# Patient Record
Sex: Female | Born: 1940
Health system: Southern US, Community
[De-identification: ages and names within clinical notes are randomized; demographics above are authoritative.]

## PROBLEM LIST (undated history)

## (undated) ENCOUNTER — Emergency Department (HOSPITAL_COMMUNITY): Admission: EM | Payer: Medicare Other | Source: Home / Self Care

## (undated) DIAGNOSIS — I272 Pulmonary hypertension, unspecified: Secondary | ICD-10-CM

## (undated) DIAGNOSIS — M810 Age-related osteoporosis without current pathological fracture: Secondary | ICD-10-CM

## (undated) DIAGNOSIS — I4891 Unspecified atrial fibrillation: Secondary | ICD-10-CM

## (undated) DIAGNOSIS — I10 Essential (primary) hypertension: Secondary | ICD-10-CM

## (undated) DIAGNOSIS — S3210XA Unspecified fracture of sacrum, initial encounter for closed fracture: Secondary | ICD-10-CM

## (undated) DIAGNOSIS — Z952 Presence of prosthetic heart valve: Secondary | ICD-10-CM

## (undated) DIAGNOSIS — I7121 Aneurysm of the ascending aorta, without rupture: Secondary | ICD-10-CM

## (undated) DIAGNOSIS — R0902 Hypoxemia: Secondary | ICD-10-CM

## (undated) DIAGNOSIS — R918 Other nonspecific abnormal finding of lung field: Secondary | ICD-10-CM

## (undated) DIAGNOSIS — T7840XA Allergy, unspecified, initial encounter: Secondary | ICD-10-CM

## (undated) DIAGNOSIS — I712 Thoracic aortic aneurysm, without rupture: Secondary | ICD-10-CM

## (undated) DIAGNOSIS — J449 Chronic obstructive pulmonary disease, unspecified: Secondary | ICD-10-CM

## (undated) DIAGNOSIS — E785 Hyperlipidemia, unspecified: Secondary | ICD-10-CM

## (undated) DIAGNOSIS — I70229 Atherosclerosis of native arteries of extremities with rest pain, unspecified extremity: Secondary | ICD-10-CM

## (undated) DIAGNOSIS — I350 Nonrheumatic aortic (valve) stenosis: Secondary | ICD-10-CM

## (undated) DIAGNOSIS — J439 Emphysema, unspecified: Secondary | ICD-10-CM

## (undated) DIAGNOSIS — I998 Other disorder of circulatory system: Secondary | ICD-10-CM

## (undated) DIAGNOSIS — R011 Cardiac murmur, unspecified: Secondary | ICD-10-CM

## (undated) DIAGNOSIS — I472 Ventricular tachycardia: Secondary | ICD-10-CM

## (undated) DIAGNOSIS — K219 Gastro-esophageal reflux disease without esophagitis: Secondary | ICD-10-CM

## (undated) HISTORY — PX: TOTAL HIP ARTHROPLASTY: SHX124

## (undated) HISTORY — DX: Gastro-esophageal reflux disease without esophagitis: K21.9

## (undated) HISTORY — DX: Hyperlipidemia, unspecified: E78.5

## (undated) HISTORY — DX: Emphysema, unspecified: J43.9

## (undated) HISTORY — DX: Essential (primary) hypertension: I10

## (undated) HISTORY — DX: Allergy, unspecified, initial encounter: T78.40XA

## (undated) HISTORY — DX: Unspecified fracture of sacrum, initial encounter for closed fracture: S32.10XA

## (undated) HISTORY — PX: LAMINECTOMY: SHX219

## (undated) HISTORY — DX: Pulmonary hypertension, unspecified: I27.20

## (undated) HISTORY — PX: EYE SURGERY: SHX253

## (undated) HISTORY — PX: CARDIAC VALVE REPLACEMENT: SHX585

## (undated) HISTORY — PX: CATARACT EXTRACTION: SUR2

## (undated) HISTORY — DX: Age-related osteoporosis without current pathological fracture: M81.0

## (undated) HISTORY — DX: Nonrheumatic aortic (valve) stenosis: I35.0

## (undated) HISTORY — DX: Cardiac murmur, unspecified: R01.1

## (undated) HISTORY — DX: Ventricular tachycardia: I47.2

## (undated) HISTORY — DX: Hypoxemia: R09.02

---
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
## 1973-10-29 HISTORY — PX: TUBAL LIGATION: SHX77

## 2011-10-30 LAB — HM COLONOSCOPY: HM COLON: NORMAL

## 2011-12-27 DIAGNOSIS — I10 Essential (primary) hypertension: Secondary | ICD-10-CM | POA: Diagnosis not present

## 2011-12-27 DIAGNOSIS — J449 Chronic obstructive pulmonary disease, unspecified: Secondary | ICD-10-CM | POA: Diagnosis not present

## 2011-12-27 DIAGNOSIS — E7800 Pure hypercholesterolemia, unspecified: Secondary | ICD-10-CM | POA: Diagnosis not present

## 2012-01-07 DIAGNOSIS — I10 Essential (primary) hypertension: Secondary | ICD-10-CM | POA: Diagnosis not present

## 2012-02-12 DIAGNOSIS — H5200 Hypermetropia, unspecified eye: Secondary | ICD-10-CM | POA: Diagnosis not present

## 2012-02-12 DIAGNOSIS — H524 Presbyopia: Secondary | ICD-10-CM | POA: Diagnosis not present

## 2012-02-12 DIAGNOSIS — H538 Other visual disturbances: Secondary | ICD-10-CM | POA: Diagnosis not present

## 2012-02-12 DIAGNOSIS — Z961 Presence of intraocular lens: Secondary | ICD-10-CM | POA: Diagnosis not present

## 2012-02-14 DIAGNOSIS — M2040 Other hammer toe(s) (acquired), unspecified foot: Secondary | ICD-10-CM | POA: Diagnosis not present

## 2012-02-14 DIAGNOSIS — M7750 Other enthesopathy of unspecified foot: Secondary | ICD-10-CM | POA: Diagnosis not present

## 2012-02-19 DIAGNOSIS — Z124 Encounter for screening for malignant neoplasm of cervix: Secondary | ICD-10-CM | POA: Diagnosis not present

## 2012-02-21 DIAGNOSIS — Z1231 Encounter for screening mammogram for malignant neoplasm of breast: Secondary | ICD-10-CM | POA: Diagnosis not present

## 2012-02-21 DIAGNOSIS — R928 Other abnormal and inconclusive findings on diagnostic imaging of breast: Secondary | ICD-10-CM | POA: Diagnosis not present

## 2012-02-25 DIAGNOSIS — L723 Sebaceous cyst: Secondary | ICD-10-CM | POA: Diagnosis not present

## 2012-02-25 DIAGNOSIS — E7800 Pure hypercholesterolemia, unspecified: Secondary | ICD-10-CM | POA: Diagnosis not present

## 2012-02-25 DIAGNOSIS — I10 Essential (primary) hypertension: Secondary | ICD-10-CM | POA: Diagnosis not present

## 2012-02-25 DIAGNOSIS — J449 Chronic obstructive pulmonary disease, unspecified: Secondary | ICD-10-CM | POA: Diagnosis not present

## 2012-02-25 DIAGNOSIS — N9089 Other specified noninflammatory disorders of vulva and perineum: Secondary | ICD-10-CM | POA: Diagnosis not present

## 2012-03-04 DIAGNOSIS — Z4800 Encounter for change or removal of nonsurgical wound dressing: Secondary | ICD-10-CM | POA: Diagnosis not present

## 2012-03-19 LAB — HM DEXA SCAN

## 2012-05-20 DIAGNOSIS — N760 Acute vaginitis: Secondary | ICD-10-CM | POA: Diagnosis not present

## 2012-08-05 DIAGNOSIS — E7800 Pure hypercholesterolemia, unspecified: Secondary | ICD-10-CM | POA: Diagnosis not present

## 2012-08-05 DIAGNOSIS — J449 Chronic obstructive pulmonary disease, unspecified: Secondary | ICD-10-CM | POA: Diagnosis not present

## 2012-08-05 DIAGNOSIS — J209 Acute bronchitis, unspecified: Secondary | ICD-10-CM | POA: Diagnosis not present

## 2012-08-23 DIAGNOSIS — Z23 Encounter for immunization: Secondary | ICD-10-CM | POA: Diagnosis not present

## 2012-10-16 DIAGNOSIS — M79609 Pain in unspecified limb: Secondary | ICD-10-CM | POA: Diagnosis not present

## 2012-10-16 DIAGNOSIS — I70209 Unspecified atherosclerosis of native arteries of extremities, unspecified extremity: Secondary | ICD-10-CM | POA: Diagnosis not present

## 2012-10-16 DIAGNOSIS — B351 Tinea unguium: Secondary | ICD-10-CM | POA: Diagnosis not present

## 2012-10-24 DIAGNOSIS — M5137 Other intervertebral disc degeneration, lumbosacral region: Secondary | ICD-10-CM | POA: Diagnosis not present

## 2012-10-24 DIAGNOSIS — IMO0002 Reserved for concepts with insufficient information to code with codable children: Secondary | ICD-10-CM | POA: Diagnosis not present

## 2012-10-27 DIAGNOSIS — M5430 Sciatica, unspecified side: Secondary | ICD-10-CM | POA: Diagnosis not present

## 2012-10-28 DIAGNOSIS — IMO0002 Reserved for concepts with insufficient information to code with codable children: Secondary | ICD-10-CM | POA: Diagnosis not present

## 2012-10-28 DIAGNOSIS — M5430 Sciatica, unspecified side: Secondary | ICD-10-CM | POA: Diagnosis not present

## 2012-11-03 DIAGNOSIS — M5430 Sciatica, unspecified side: Secondary | ICD-10-CM | POA: Diagnosis not present

## 2012-11-04 DIAGNOSIS — M5137 Other intervertebral disc degeneration, lumbosacral region: Secondary | ICD-10-CM | POA: Diagnosis not present

## 2012-11-04 DIAGNOSIS — Z981 Arthrodesis status: Secondary | ICD-10-CM | POA: Diagnosis not present

## 2012-11-05 DIAGNOSIS — H5200 Hypermetropia, unspecified eye: Secondary | ICD-10-CM | POA: Diagnosis not present

## 2012-11-05 DIAGNOSIS — M5430 Sciatica, unspecified side: Secondary | ICD-10-CM | POA: Diagnosis not present

## 2012-11-05 DIAGNOSIS — H049 Disorder of lacrimal system, unspecified: Secondary | ICD-10-CM | POA: Diagnosis not present

## 2012-11-05 DIAGNOSIS — H524 Presbyopia: Secondary | ICD-10-CM | POA: Diagnosis not present

## 2012-11-05 DIAGNOSIS — H538 Other visual disturbances: Secondary | ICD-10-CM | POA: Diagnosis not present

## 2012-11-10 DIAGNOSIS — M5430 Sciatica, unspecified side: Secondary | ICD-10-CM | POA: Diagnosis not present

## 2012-11-14 DIAGNOSIS — M5430 Sciatica, unspecified side: Secondary | ICD-10-CM | POA: Diagnosis not present

## 2012-11-17 DIAGNOSIS — M5430 Sciatica, unspecified side: Secondary | ICD-10-CM | POA: Diagnosis not present

## 2012-11-19 DIAGNOSIS — M5430 Sciatica, unspecified side: Secondary | ICD-10-CM | POA: Diagnosis not present

## 2012-11-21 DIAGNOSIS — M5430 Sciatica, unspecified side: Secondary | ICD-10-CM | POA: Diagnosis not present

## 2012-11-26 DIAGNOSIS — M5430 Sciatica, unspecified side: Secondary | ICD-10-CM | POA: Diagnosis not present

## 2012-11-27 DIAGNOSIS — R3129 Other microscopic hematuria: Secondary | ICD-10-CM | POA: Diagnosis not present

## 2012-11-27 DIAGNOSIS — N281 Cyst of kidney, acquired: Secondary | ICD-10-CM | POA: Diagnosis not present

## 2012-11-28 DIAGNOSIS — M76899 Other specified enthesopathies of unspecified lower limb, excluding foot: Secondary | ICD-10-CM | POA: Diagnosis not present

## 2012-11-28 DIAGNOSIS — Z96649 Presence of unspecified artificial hip joint: Secondary | ICD-10-CM | POA: Diagnosis not present

## 2012-11-28 DIAGNOSIS — M5430 Sciatica, unspecified side: Secondary | ICD-10-CM | POA: Diagnosis not present

## 2012-12-10 DIAGNOSIS — IMO0002 Reserved for concepts with insufficient information to code with codable children: Secondary | ICD-10-CM | POA: Diagnosis not present

## 2012-12-10 DIAGNOSIS — M48061 Spinal stenosis, lumbar region without neurogenic claudication: Secondary | ICD-10-CM | POA: Diagnosis not present

## 2012-12-10 DIAGNOSIS — M76899 Other specified enthesopathies of unspecified lower limb, excluding foot: Secondary | ICD-10-CM | POA: Diagnosis not present

## 2013-01-01 DIAGNOSIS — N281 Cyst of kidney, acquired: Secondary | ICD-10-CM | POA: Diagnosis not present

## 2013-01-27 LAB — HM MAMMOGRAPHY: HM Mammogram: NORMAL

## 2013-01-27 LAB — HM PAP SMEAR: HM PAP: NORMAL

## 2013-02-25 DIAGNOSIS — Z9189 Other specified personal risk factors, not elsewhere classified: Secondary | ICD-10-CM | POA: Diagnosis not present

## 2013-02-25 DIAGNOSIS — Z124 Encounter for screening for malignant neoplasm of cervix: Secondary | ICD-10-CM | POA: Diagnosis not present

## 2013-03-11 DIAGNOSIS — J449 Chronic obstructive pulmonary disease, unspecified: Secondary | ICD-10-CM | POA: Diagnosis not present

## 2013-03-11 DIAGNOSIS — H9190 Unspecified hearing loss, unspecified ear: Secondary | ICD-10-CM | POA: Diagnosis not present

## 2013-03-11 DIAGNOSIS — J4489 Other specified chronic obstructive pulmonary disease: Secondary | ICD-10-CM | POA: Diagnosis not present

## 2013-03-11 DIAGNOSIS — E7800 Pure hypercholesterolemia, unspecified: Secondary | ICD-10-CM | POA: Diagnosis not present

## 2013-03-11 DIAGNOSIS — I10 Essential (primary) hypertension: Secondary | ICD-10-CM | POA: Diagnosis not present

## 2013-03-13 DIAGNOSIS — Z803 Family history of malignant neoplasm of breast: Secondary | ICD-10-CM | POA: Diagnosis not present

## 2013-03-13 DIAGNOSIS — Z1231 Encounter for screening mammogram for malignant neoplasm of breast: Secondary | ICD-10-CM | POA: Diagnosis not present

## 2013-03-26 DIAGNOSIS — L723 Sebaceous cyst: Secondary | ICD-10-CM | POA: Diagnosis not present

## 2013-03-26 DIAGNOSIS — D1801 Hemangioma of skin and subcutaneous tissue: Secondary | ICD-10-CM | POA: Diagnosis not present

## 2013-03-26 DIAGNOSIS — L821 Other seborrheic keratosis: Secondary | ICD-10-CM | POA: Diagnosis not present

## 2013-08-13 DIAGNOSIS — Z23 Encounter for immunization: Secondary | ICD-10-CM | POA: Diagnosis not present

## 2014-03-19 ENCOUNTER — Ambulatory Visit (INDEPENDENT_AMBULATORY_CARE_PROVIDER_SITE_OTHER): Payer: Medicare Other | Admitting: Family Medicine

## 2014-03-19 ENCOUNTER — Encounter: Payer: Self-pay | Admitting: Family Medicine

## 2014-03-19 VITALS — BP 132/80 | HR 92 | Temp 97.9°F | Resp 16 | Ht 65.0 in | Wt 158.4 lb

## 2014-03-19 DIAGNOSIS — I10 Essential (primary) hypertension: Secondary | ICD-10-CM | POA: Insufficient documentation

## 2014-03-19 DIAGNOSIS — J449 Chronic obstructive pulmonary disease, unspecified: Secondary | ICD-10-CM | POA: Insufficient documentation

## 2014-03-19 DIAGNOSIS — J439 Emphysema, unspecified: Secondary | ICD-10-CM

## 2014-03-19 DIAGNOSIS — E785 Hyperlipidemia, unspecified: Secondary | ICD-10-CM

## 2014-03-19 DIAGNOSIS — J438 Other emphysema: Secondary | ICD-10-CM | POA: Diagnosis not present

## 2014-03-19 DIAGNOSIS — J441 Chronic obstructive pulmonary disease with (acute) exacerbation: Secondary | ICD-10-CM | POA: Insufficient documentation

## 2014-03-19 DIAGNOSIS — Z803 Family history of malignant neoplasm of breast: Secondary | ICD-10-CM | POA: Diagnosis not present

## 2014-03-19 NOTE — Progress Notes (Signed)
   Subjective:    Patient ID: Dominique [name], female    DOB: [birth year], 73 y.o.   MRN: [id number]  HPI    Review of Systems     Objective:   Physical Exam  Cardiovascular: Normal rate and regular rhythm.   Murmur (II/VI SEM heard best over RUSB) heard.         Assessment & Plan:

## 2014-03-19 NOTE — Progress Notes (Signed)
   Subjective:    Patient ID: Dominique [name], female    DOB: 1941/01/16, 73 y.o.   MRN: [id number]  HPI New to establish.  Previous MD- Ranelle Oyster [place], [place])  HTN- chronic problem, on benazepril and amlodipine.  Well controlled.  No CP, SOB, HAs, visual changes, edema.  Hyperlipidemia- chronic problem, on Pravastatin.  Denies abd pain, N/V, myalgias  Emphysema- pt quit smoking 10 yrs ago.  Reports she has not had any lung issues or difficulty breathing since she quit.  Health maintenance- due for mammo (mother had breast cancer), interested in GYN referral   Review of Systems For ROS see HPI     Objective:   Physical Exam  Vitals reviewed. Constitutional: She is oriented to person, place, and time. She appears well-developed and well-nourished. No distress.  HENT:  Head: Normocephalic and atraumatic.  Eyes: Conjunctivae and EOM are normal. Pupils are equal, round, and reactive to light.  Neck: Normal range of motion. Neck supple. No thyromegaly present.  Cardiovascular: Normal rate, regular rhythm, normal heart sounds and intact distal pulses.   No murmur heard. Pulmonary/Chest: Effort normal and breath sounds normal. No respiratory distress.  Abdominal: Soft. She exhibits no distension. There is no tenderness.  Musculoskeletal: She exhibits no edema.  Lymphadenopathy:    She has no cervical adenopathy.  Neurological: She is alert and oriented to person, place, and time.  Skin: Skin is warm and dry.  Psychiatric: She has a normal mood and affect. Her behavior is normal.          Assessment & Plan:

## 2014-03-19 NOTE — Patient Instructions (Signed)
Schedule a fasting lab appt Schedule your complete physical in 6 months We'll notify you of your lab results and make any changes if needed We'll call you with your GYN appt Call with any questions or concerns Welcome!  We're glad to have you!

## 2014-03-19 NOTE — Assessment & Plan Note (Signed)
New to provider, ongoing for pt.  Check labs.  Adjust meds prn  

## 2014-03-19 NOTE — Assessment & Plan Note (Signed)
New to provider, ongoing for pt.  Asymptomatic.  Well controlled.  Check labs.  No anticipated med changes.

## 2014-03-19 NOTE — Assessment & Plan Note (Signed)
Refer to GYN at pt's request for mammo.

## 2014-03-19 NOTE — Assessment & Plan Note (Signed)
New for provider, pt has hx of this.  Reports she has been asymptomatic since she quit smoking in 2005.  Will follow.

## 2014-03-24 ENCOUNTER — Other Ambulatory Visit (INDEPENDENT_AMBULATORY_CARE_PROVIDER_SITE_OTHER): Payer: Medicare Other

## 2014-03-24 DIAGNOSIS — I10 Essential (primary) hypertension: Secondary | ICD-10-CM

## 2014-03-24 DIAGNOSIS — E785 Hyperlipidemia, unspecified: Secondary | ICD-10-CM

## 2014-03-24 LAB — BASIC METABOLIC PANEL
BUN: 14 mg/dL (ref 6–23)
CO2: 27 mEq/L (ref 19–32)
Calcium: 9 mg/dL (ref 8.4–10.5)
Chloride: 106 mEq/L (ref 96–112)
Creatinine, Ser: 0.8 mg/dL (ref 0.4–1.2)
GFR: 75.63 mL/min (ref >60.00)
Glucose, Bld: 79 mg/dL (ref 70–99)
POTASSIUM: 4.2 meq/L (ref 3.5–5.1)
SODIUM: 140 meq/L (ref 135–145)

## 2014-03-24 LAB — CBC WITH DIFFERENTIAL/PLATELET
BASOS ABS: 0 10*3/uL (ref 0.0–0.1)
Basophils Relative: 0.5 % (ref 0.0–3.0)
EOS ABS: 0.6 10*3/uL (ref 0.0–0.7)
Eosinophils Relative: 7.1 % — ABNORMAL HIGH (ref 0.0–5.0)
HEMATOCRIT: 40.4 % (ref 36.0–46.0)
HEMOGLOBIN: 13.3 g/dL (ref 12.0–15.0)
LYMPHS ABS: 2.1 10*3/uL (ref 0.7–4.0)
LYMPHS PCT: 27.1 % (ref 12.0–46.0)
MCHC: 33 g/dL (ref 30.0–36.0)
MCV: 93.1 fl (ref 78.0–100.0)
Monocytes Absolute: 0.8 10*3/uL (ref 0.1–1.0)
Monocytes Relative: 9.8 % (ref 3.0–12.0)
Neutro Abs: 4.3 10*3/uL (ref 1.4–7.7)
Neutrophils Relative %: 55.5 % (ref 43.0–77.0)
Platelets: 319 10*3/uL (ref 150.0–400.0)
RBC: 4.34 Mil/uL (ref 3.87–5.11)
RDW: 14.4 % (ref 11.5–15.5)
WBC: 7.7 10*3/uL (ref 4.0–10.5)

## 2014-03-24 LAB — HEPATIC FUNCTION PANEL
ALT: 17 U/L (ref 0–35)
AST: 22 U/L (ref 0–37)
Albumin: 3.6 g/dL (ref 3.5–5.2)
Alkaline Phosphatase: 54 U/L (ref 39–117)
Bilirubin, Direct: 0 mg/dL (ref 0.0–0.3)
TOTAL PROTEIN: 6.7 g/dL (ref 6.0–8.3)
Total Bilirubin: 0.5 mg/dL (ref 0.2–1.2)

## 2014-03-24 LAB — LIPID PANEL
Cholesterol: 187 mg/dL (ref 0–200)
HDL: 73.4 mg/dL (ref >39.00)
LDL Cholesterol: 98 mg/dL (ref 0–99)
Total CHOL/HDL Ratio: 3
Triglycerides: 78 mg/dL (ref 0.0–149.0)
VLDL: 15.6 mg/dL (ref 0.0–40.0)

## 2014-03-24 LAB — TSH: TSH: 0.85 u[IU]/mL (ref 0.35–4.50)

## 2014-03-25 ENCOUNTER — Encounter: Payer: Self-pay | Admitting: General Practice

## 2014-03-29 DIAGNOSIS — Z124 Encounter for screening for malignant neoplasm of cervix: Secondary | ICD-10-CM | POA: Diagnosis not present

## 2014-03-29 DIAGNOSIS — Z1231 Encounter for screening mammogram for malignant neoplasm of breast: Secondary | ICD-10-CM | POA: Diagnosis not present

## 2014-05-10 ENCOUNTER — Other Ambulatory Visit: Payer: Self-pay | Admitting: General Practice

## 2014-05-10 MED ORDER — PRAVASTATIN SODIUM 20 MG PO TABS: 20.0000 mg | ORAL_TABLET | Freq: Every day | ORAL | Status: DC

## 2014-05-10 MED ORDER — BENAZEPRIL HCL 20 MG PO TABS: 20.0000 mg | ORAL_TABLET | Freq: Every day | ORAL | Status: DC

## 2014-05-10 MED ORDER — AMLODIPINE BESYLATE 5 MG PO TABS: 5.0000 mg | ORAL_TABLET | Freq: Every day | ORAL | Status: DC

## 2014-08-10 DIAGNOSIS — J029 Acute pharyngitis, unspecified: Secondary | ICD-10-CM | POA: Diagnosis not present

## 2014-08-10 DIAGNOSIS — R0982 Postnasal drip: Secondary | ICD-10-CM | POA: Diagnosis not present

## 2014-08-11 ENCOUNTER — Ambulatory Visit: Payer: Medicare Other | Admitting: Family Medicine

## 2014-08-26 DIAGNOSIS — J301 Allergic rhinitis due to pollen: Secondary | ICD-10-CM | POA: Diagnosis not present

## 2014-08-26 DIAGNOSIS — J3081 Allergic rhinitis due to animal (cat) (dog) hair and dander: Secondary | ICD-10-CM | POA: Diagnosis not present

## 2014-08-26 DIAGNOSIS — J302 Other seasonal allergic rhinitis: Secondary | ICD-10-CM | POA: Diagnosis not present

## 2014-08-26 DIAGNOSIS — J309 Allergic rhinitis, unspecified: Secondary | ICD-10-CM | POA: Diagnosis not present

## 2014-09-27 ENCOUNTER — Encounter: Payer: Self-pay | Admitting: Family Medicine

## 2014-09-27 ENCOUNTER — Ambulatory Visit (INDEPENDENT_AMBULATORY_CARE_PROVIDER_SITE_OTHER): Payer: Medicare Other | Admitting: Family Medicine

## 2014-09-27 ENCOUNTER — Encounter: Payer: Self-pay | Admitting: General Practice

## 2014-09-27 VITALS — BP 144/86 | HR 81 | Temp 97.9°F | Resp 16 | Ht 66.0 in | Wt 157.2 lb

## 2014-09-27 DIAGNOSIS — Z23 Encounter for immunization: Secondary | ICD-10-CM

## 2014-09-27 DIAGNOSIS — E785 Hyperlipidemia, unspecified: Secondary | ICD-10-CM | POA: Diagnosis not present

## 2014-09-27 DIAGNOSIS — Z0000 Encounter for general adult medical examination without abnormal findings: Secondary | ICD-10-CM | POA: Diagnosis not present

## 2014-09-27 DIAGNOSIS — I10 Essential (primary) hypertension: Secondary | ICD-10-CM

## 2014-09-27 LAB — LIPID PANEL
CHOL/HDL RATIO: 3
Cholesterol: 219 mg/dL — ABNORMAL HIGH (ref 0–200)
HDL: 75.9 mg/dL (ref >39.00)
LDL Cholesterol: 124 mg/dL — ABNORMAL HIGH (ref 0–99)
NONHDL: 143.1
Triglycerides: 94 mg/dL (ref 0.0–149.0)
VLDL: 18.8 mg/dL (ref 0.0–40.0)

## 2014-09-27 LAB — BASIC METABOLIC PANEL
BUN: 16 mg/dL (ref 6–23)
CALCIUM: 9.1 mg/dL (ref 8.4–10.5)
CO2: 25 meq/L (ref 19–32)
CREATININE: 0.7 mg/dL (ref 0.4–1.2)
Chloride: 104 mEq/L (ref 96–112)
GFR: 81.45 mL/min (ref >60.00)
Glucose, Bld: 89 mg/dL (ref 70–99)
Potassium: 4.5 mEq/L (ref 3.5–5.1)
Sodium: 138 mEq/L (ref 135–145)

## 2014-09-27 LAB — TSH: TSH: 1.03 u[IU]/mL (ref 0.35–4.50)

## 2014-09-27 LAB — HEPATIC FUNCTION PANEL
ALT: 22 U/L (ref 0–35)
AST: 26 U/L (ref 0–37)
Albumin: 4.1 g/dL (ref 3.5–5.2)
Alkaline Phosphatase: 59 U/L (ref 39–117)
BILIRUBIN DIRECT: 0.1 mg/dL (ref 0.0–0.3)
Total Bilirubin: 0.5 mg/dL (ref 0.2–1.2)
Total Protein: 7.5 g/dL (ref 6.0–8.3)

## 2014-09-27 LAB — CBC WITH DIFFERENTIAL/PLATELET
BASOS PCT: 0.9 % (ref 0.0–3.0)
Basophils Absolute: 0.1 10*3/uL (ref 0.0–0.1)
EOS PCT: 11.9 % — AB (ref 0.0–5.0)
Eosinophils Absolute: 0.9 10*3/uL — ABNORMAL HIGH (ref 0.0–0.7)
HCT: 40.9 % (ref 36.0–46.0)
Hemoglobin: 13.5 g/dL (ref 12.0–15.0)
Lymphocytes Relative: 26.1 % (ref 12.0–46.0)
Lymphs Abs: 2 10*3/uL (ref 0.7–4.0)
MCHC: 33 g/dL (ref 30.0–36.0)
MCV: 92.4 fl (ref 78.0–100.0)
MONOS PCT: 7.1 % (ref 3.0–12.0)
Monocytes Absolute: 0.5 10*3/uL (ref 0.1–1.0)
NEUTROS PCT: 54 % (ref 43.0–77.0)
Neutro Abs: 4.1 10*3/uL (ref 1.4–7.7)
Platelets: 313 10*3/uL (ref 150.0–400.0)
RBC: 4.43 Mil/uL (ref 3.87–5.11)
RDW: 14.1 % (ref 11.5–15.5)
WBC: 7.5 10*3/uL (ref 4.0–10.5)

## 2014-09-27 NOTE — Addendum Note (Signed)
Addended by: Katherine Roan L on: 09/27/2014 11:02 AM   Modules accepted: Orders

## 2014-09-27 NOTE — Assessment & Plan Note (Signed)
Chronic problem.  Tolerating statin w/o difficulty.  Check labs.  Adjust meds prn  

## 2014-09-27 NOTE — Assessment & Plan Note (Signed)
Pt's PE WNL.  UTD on GYN, colonoscopy.  Pt refusing flu shot and DEXA.  Prevnar given.  Written screening schedule updated and given to pt.  Check labs.  Anticipatory guidance provided.

## 2014-09-27 NOTE — Patient Instructions (Signed)
Follow up in 6 months to recheck BP and cholesterol We'll notify you of your lab results and make any changes if needed Keep up the good work on healthy diet and regular exercise- you look great! Call with any questions or concerns Happy Holidays!!! 

## 2014-09-27 NOTE — Progress Notes (Signed)
   Subjective:    Patient ID: Dominique [name], female    DOB: 1941/07/18, 73 y.o.   MRN: [id number]  HPI Here today for CPE.  Risk Factors: HTN- chronic problem, on Benazepril, Amlodipine.  BP is mildly elevated today due to traffic en route to office. Hyperlipidemia- chronic problem, on Pravastatin. Physical Activity: walking dog regularly Fall Risk: low Depression: denies current sxs Hearing: normal to conversational tones and whispered voice at 6 ft ADL's: independent Cognitive: normal linear thought process, memory and attention intact Home Safety: safe at home, lives alone w/ dog Height, Weight, BMI, Visual Acuity: see vitals, vision corrected to 20/20 w/ glasses Counseling: UTD on colonoscopy.  UTD on mammo and DEXA- 'i don't want another one'.  Pt refusing flu shot this year, 'i don't want it'.  No need for paps. Labs Ordered: See A&P Care Plan: See A&P    Review of Systems Patient reports no vision/ hearing changes, adenopathy,fever, weight change,  persistant/recurrent hoarseness , swallowing issues, chest pain, palpitations, edema, persistant/recurrent cough, hemoptysis, dyspnea (rest/exertional/paroxysmal nocturnal), gastrointestinal bleeding (melena, rectal bleeding), abdominal pain, significant heartburn, bowel changes, GU symptoms (dysuria, hematuria, incontinence), Gyn symptoms (abnormal  bleeding, pain),  syncope, focal weakness, memory loss, numbness & tingling, skin/hair/nail changes, abnormal bruising or bleeding, anxiety, or depression.     Objective:   Physical Exam General Appearance:    Alert, cooperative, no distress, appears stated age  Head:    Normocephalic, without obvious abnormality, atraumatic  Eyes:    PERRL, conjunctiva/corneas clear, EOM's intact, fundi    benign, both eyes  Ears:    Normal TM's and external ear canals, both ears  Nose:   Nares normal, septum midline, mucosa normal, no drainage    or sinus tenderness  Throat:   Lips, mucosa, and  tongue normal; teeth and gums normal  Neck:   Supple, symmetrical, trachea midline, no adenopathy;    Thyroid: no enlargement/tenderness/nodules  Back:     Symmetric, no curvature, ROM normal, no CVA tenderness  Lungs:     Clear to auscultation bilaterally, respirations unlabored  Chest Wall:    No tenderness or deformity   Heart:    Regular rate and rhythm, S1 and S2 normal, no murmur, rub   or gallop  Breast Exam:    Deferred to GYN  Abdomen:     Soft, non-tender, bowel sounds active all four quadrants,    no masses, no organomegaly  Genitalia:    Deferred to GYN  Rectal:    Extremities:   Extremities normal, atraumatic, no cyanosis or edema  Pulses:   2+ and symmetric all extremities  Skin:   Skin color, texture, turgor normal, no rashes or lesions  Lymph nodes:   Cervical, supraclavicular, and axillary nodes normal  Neurologic:   CNII-XII intact, normal strength, sensation and reflexes    throughout          Assessment & Plan:

## 2014-09-27 NOTE — Progress Notes (Signed)
Pre visit review using our clinic review tool, if applicable. No additional management support is needed unless otherwise documented below in the visit note. 

## 2014-09-27 NOTE — Assessment & Plan Note (Signed)
Chronic problem.  Adequate but not ideal control today due to traffic en route to office.  Asymptomatic.  Check labs.  No anticipated med changes.

## 2014-10-29 HISTORY — PX: JOINT REPLACEMENT: SHX530

## 2014-11-12 ENCOUNTER — Other Ambulatory Visit: Payer: Self-pay | Admitting: General Practice

## 2014-11-12 MED ORDER — PRAVASTATIN SODIUM 20 MG PO TABS: 20.0000 mg | ORAL_TABLET | Freq: Every day | ORAL | Status: DC

## 2014-11-12 MED ORDER — BENAZEPRIL HCL 20 MG PO TABS: 20.0000 mg | ORAL_TABLET | Freq: Every day | ORAL | Status: DC

## 2014-11-12 MED ORDER — AMLODIPINE BESYLATE 5 MG PO TABS: 5.0000 mg | ORAL_TABLET | Freq: Every day | ORAL | Status: DC

## 2014-12-19 ENCOUNTER — Encounter (HOSPITAL_BASED_OUTPATIENT_CLINIC_OR_DEPARTMENT_OTHER): Payer: Self-pay | Admitting: *Deleted

## 2014-12-19 ENCOUNTER — Emergency Department (HOSPITAL_BASED_OUTPATIENT_CLINIC_OR_DEPARTMENT_OTHER)
Admission: EM | Admit: 2014-12-19 | Discharge: 2014-12-19 | Disposition: A | Discharge status: Home / Self Care | Payer: Medicare Other | Attending: Emergency Medicine | Admitting: Emergency Medicine

## 2014-12-19 ENCOUNTER — Emergency Department (HOSPITAL_BASED_OUTPATIENT_CLINIC_OR_DEPARTMENT_OTHER): Payer: Medicare Other

## 2014-12-19 DIAGNOSIS — Z87891 Personal history of nicotine dependence: Secondary | ICD-10-CM | POA: Diagnosis not present

## 2014-12-19 DIAGNOSIS — Z79899 Other long term (current) drug therapy: Secondary | ICD-10-CM | POA: Insufficient documentation

## 2014-12-19 DIAGNOSIS — R0989 Other specified symptoms and signs involving the circulatory and respiratory systems: Secondary | ICD-10-CM | POA: Diagnosis not present

## 2014-12-19 DIAGNOSIS — Z7982 Long term (current) use of aspirin: Secondary | ICD-10-CM | POA: Diagnosis not present

## 2014-12-19 DIAGNOSIS — Z87440 Personal history of urinary (tract) infections: Secondary | ICD-10-CM | POA: Insufficient documentation

## 2014-12-19 DIAGNOSIS — R011 Cardiac murmur, unspecified: Secondary | ICD-10-CM | POA: Diagnosis not present

## 2014-12-19 DIAGNOSIS — B349 Viral infection, unspecified: Secondary | ICD-10-CM

## 2014-12-19 DIAGNOSIS — R0981 Nasal congestion: Secondary | ICD-10-CM | POA: Diagnosis present

## 2014-12-19 DIAGNOSIS — R05 Cough: Secondary | ICD-10-CM | POA: Diagnosis not present

## 2014-12-19 DIAGNOSIS — Z8709 Personal history of other diseases of the respiratory system: Secondary | ICD-10-CM | POA: Insufficient documentation

## 2014-12-19 DIAGNOSIS — I10 Essential (primary) hypertension: Secondary | ICD-10-CM | POA: Insufficient documentation

## 2014-12-19 DIAGNOSIS — M542 Cervicalgia: Secondary | ICD-10-CM | POA: Diagnosis not present

## 2014-12-19 LAB — CBC WITH DIFFERENTIAL/PLATELET
Basophils Absolute: 0 10*3/uL (ref 0.0–0.1)
Basophils Relative: 0 % (ref 0–1)
EOS ABS: 0.3 10*3/uL (ref 0.0–0.7)
Eosinophils Relative: 3 % (ref 0–5)
HCT: 44.2 % (ref 36.0–46.0)
HEMOGLOBIN: 14.5 g/dL (ref 12.0–15.0)
LYMPHS ABS: 2 10*3/uL (ref 0.7–4.0)
Lymphocytes Relative: 21 % (ref 12–46)
MCH: 30.7 pg (ref 26.0–34.0)
MCHC: 32.8 g/dL (ref 30.0–36.0)
MCV: 93.6 fL (ref 78.0–100.0)
Monocytes Absolute: 1 10*3/uL (ref 0.1–1.0)
Monocytes Relative: 10 % (ref 3–12)
NEUTROS ABS: 6.4 10*3/uL (ref 1.7–7.7)
NEUTROS PCT: 66 % (ref 43–77)
PLATELETS: 367 10*3/uL (ref 150–400)
RBC: 4.72 MIL/uL (ref 3.87–5.11)
RDW: 13.8 % (ref 11.5–15.5)
WBC: 9.6 10*3/uL (ref 4.0–10.5)

## 2014-12-19 LAB — URINALYSIS, ROUTINE W REFLEX MICROSCOPIC
Bilirubin Urine: NEGATIVE
GLUCOSE, UA: NEGATIVE mg/dL
Hgb urine dipstick: NEGATIVE
Ketones, ur: 15 mg/dL — AB
Nitrite: NEGATIVE
PROTEIN: 30 mg/dL — AB
Specific Gravity, Urine: 1.029 (ref 1.005–1.030)
Urobilinogen, UA: 0.2 mg/dL (ref 0.0–1.0)
pH: 5 (ref 5.0–8.0)

## 2014-12-19 LAB — BASIC METABOLIC PANEL
ANION GAP: 2 — AB (ref 5–15)
Anion gap: 4 — ABNORMAL LOW (ref 5–15)
BUN: 21 mg/dL (ref 6–23)
BUN: 20 mg/dL (ref 6–23)
CHLORIDE: 108 mmol/L (ref 96–112)
CO2: 25 mmol/L (ref 19–32)
CO2: 25 mmol/L (ref 19–32)
CREATININE: 0.94 mg/dL (ref 0.50–1.10)
Calcium: 8.2 mg/dL — ABNORMAL LOW (ref 8.4–10.5)
Calcium: 7.8 mg/dL — ABNORMAL LOW (ref 8.4–10.5)
Chloride: 104 mmol/L (ref 96–112)
Creatinine, Ser: 0.74 mg/dL (ref 0.50–1.10)
GFR calc Af Amer: 68 mL/min — ABNORMAL LOW (ref >90)
GFR calc Af Amer: >90 mL/min (ref >90)
GFR calc non Af Amer: 58 mL/min — ABNORMAL LOW (ref >90)
GFR, EST NON AFRICAN AMERICAN: 82 mL/min — AB (ref >90)
GLUCOSE: 94 mg/dL (ref 70–99)
GLUCOSE: 98 mg/dL (ref 70–99)
POTASSIUM: 3.8 mmol/L (ref 3.5–5.1)
Potassium: 5.3 mmol/L — ABNORMAL HIGH (ref 3.5–5.1)
Sodium: 133 mmol/L — ABNORMAL LOW (ref 135–145)
Sodium: 135 mmol/L (ref 135–145)

## 2014-12-19 LAB — URINE MICROSCOPIC-ADD ON

## 2014-12-19 LAB — TROPONIN I

## 2014-12-19 MED ORDER — AZITHROMYCIN 1 G PO PACK: 1.0000 g | PACK | Freq: Once | ORAL | Status: DC

## 2014-12-19 MED ORDER — SODIUM CHLORIDE 0.9 % IV BOLUS (SEPSIS)
1000.0000 mL | Freq: Once | INTRAVENOUS | Status: AC
  Administered 2014-12-19: 1000 mL via INTRAVENOUS

## 2014-12-19 MED ORDER — CEFTRIAXONE SODIUM 250 MG IJ SOLR: 250.0000 mg | Freq: Once | INTRAMUSCULAR | Status: DC

## 2014-12-19 NOTE — ED Provider Notes (Signed)
CSN: [id number]     Arrival date & time 12/19/14  1624 History  This chart was scribed for Ernestina Patches, MD by Hilda Lias, ED Scribe. This patient was seen in room MHT13/[facility]13 and the patient's care was started at 6:50 PM.    Chief Complaint  Patient presents with  . Nasal Congestion  . Fatigue  . Cough    The history is provided by the patient. No language interpreter was used.    HPI Comments: Brucha Ahlquist is a 74 y.o. female who presents to the Emergency Department complaining of constant, worsening nasal and chest congestion with associated fatigue, cough, vomiting, and left neck and arm pain that has been present for around one week. Pt states that she was on a cruise for 14 days from 2/1- 2/14 in the [place] when her symptoms started. Pt was seen by physician on the cruise and prescribed a Z-pack. Pt states that since she has come back from the cruise she has had severe fatigue and feels she needs to sit in bed all day. Pt notes that her cough has been productive with yellow sputum. Pt states that she has also had diaphoresis with light physical exertion. Pt notes that she vomited a few times two days ago as well. Pt states that she has had a stress test in the past, but has not had a heart catheterization before. Pt states that her neck pain is intermittent, and begins on the left side and radiates down the left arm. Pt denies fever.     Past Medical History  Diagnosis Date  . Emphysema lung   . Cardiac arrhythmia due to congenital heart disease   . Heart murmur   . Hypertension   . History of blood transfusion 1971    with C-section complication  . UTI (lower urinary tract infection)    Past Surgical History  Procedure Laterality Date  . Appendectomy  1951  . Cesarean section      1971  . Tubal ligation  1975  . Laminectomy    . Total hip arthroplasty Bilateral   . Cataract extraction     Family History  Problem Relation Age of Onset  . Cancer  Mother     breast  . Cancer Brother     lung  . Diabetes Son     type 1   History  Substance Use Topics  . Smoking status: Former Smoker    Quit date: 03/19/2004  . Smokeless tobacco: Never Used  . Alcohol Use: Yes   OB History    No data available     Review of Systems  Constitutional: Positive for diaphoresis and fatigue. Negative for fever.  HENT: Positive for congestion.   Respiratory: Positive for cough.   Gastrointestinal: Positive for vomiting.  Musculoskeletal: Positive for myalgias and neck pain.      Allergies  Review of patient's allergies indicates no known allergies.  Home Medications   Prior to Admission medications   Medication Sig Start Date End Date Taking? Authorizing Provider  amLODipine (NORVASC) 5 MG tablet Take 1 tablet (5 mg total) by mouth daily. 11/12/14   Midge Minium, MD  aspirin EC 81 MG tablet Take 81 mg by mouth daily.    Historical Provider, MD  benazepril (LOTENSIN) 20 MG tablet Take 1 tablet (20 mg total) by mouth daily. 11/12/14   Midge Minium, MD  Calcium-Vitamin D-Vitamin K (VIACTIV PO) Take by mouth.    Historical Provider, MD  Multiple Vitamin (MULTIVITAMIN) tablet Take 1 tablet by mouth daily.    Historical Provider, MD  pravastatin (PRAVACHOL) 20 MG tablet Take 1 tablet (20 mg total) by mouth daily. 11/12/14   Midge Minium, MD   BP 128/57 mmHg  Pulse 73  Temp(Src) 98.1 F (36.7 C) (Oral)  Resp 18  Ht 5\' 6"  (1.676 m)  Wt 152 lb (68.947 kg)  BMI 24.55 kg/m2  SpO2 98% Physical Exam  Constitutional: She is oriented to person, place, and time. She appears well-developed and well-nourished. No distress.  HENT:  Head: Normocephalic.  Mouth/Throat: Oropharynx is clear and moist.  Eyes: Pupils are equal, round, and reactive to light.  Neck: Neck supple.  Cardiovascular: Normal rate, regular rhythm and normal heart sounds.   Pulmonary/Chest: Effort normal and breath sounds normal. No respiratory distress. She has  no wheezes.  Abdominal: Soft. She exhibits no distension. There is no tenderness. There is no rebound and no guarding.  Musculoskeletal: She exhibits no edema or tenderness.  Neurological: She is alert and oriented to person, place, and time.  Skin: Skin is warm and dry.  Psychiatric: She has a normal mood and affect.  Nursing note and vitals reviewed.   ED Course  Procedures (including critical care time)  DIAGNOSTIC STUDIES: Oxygen Saturation is 97% on RA, normal by my interpretation.    COORDINATION OF CARE: 6:54 PM Discussed treatment plan with pt at bedside and pt agreed to plan.   Labs Review Labs Reviewed  URINALYSIS, ROUTINE W REFLEX MICROSCOPIC - Abnormal; Notable for the following:    Color, Urine AMBER (*)    APPearance CLOUDY (*)    Ketones, ur 15 (*)    Protein, ur 30 (*)    Leukocytes, UA SMALL (*)    All other components within normal limits  BASIC METABOLIC PANEL - Abnormal; Notable for the following:    Sodium 133 (*)    Potassium 5.3 (*)    Calcium 8.2 (*)    GFR calc non Af Amer 58 (*)    GFR calc Af Amer 68 (*)    Anion gap 4 (*)    All other components within normal limits  URINE MICROSCOPIC-ADD ON - Abnormal; Notable for the following:    Squamous Epithelial / LPF MANY (*)    Bacteria, UA MANY (*)    Casts HYALINE CASTS (*)    Crystals CA OXALATE CRYSTALS (*)    All other components within normal limits  BASIC METABOLIC PANEL - Abnormal; Notable for the following:    Calcium 7.8 (*)    GFR calc non Af Amer 82 (*)    Anion gap 2 (*)    All other components within normal limits  TROPONIN I  CBC WITH DIFFERENTIAL/PLATELET  CBC WITH DIFFERENTIAL/PLATELET  LEGIONELLA ANTIGEN, URINE    Imaging Review Dg Chest 2 View  12/19/2014   CLINICAL DATA:  74 year old female with a history of chest congestion and productive cough.  EXAM: CHEST - 2 VIEW  COMPARISON:  None.  FINDINGS: Cardiomediastinal silhouette projects within normal limits in size and  contour. No confluent airspace disease, pneumothorax, or pleural effusion.  No displaced fracture.  Unremarkable appearance of the upper abdomen.  IMPRESSION: No radiographic evidence of acute cardiopulmonary disease.  Signed,  Dulcy Fanny. Earleen Newport, DO  [facility]  [facility]   Electronically Signed   By: Corrie Mckusick D.O.   On: 12/19/2014 19:21     EKG Interpretation None  MDM   Final diagnoses:  Acute viral syndrome    Pt is a 74 y.o. female with Pmhx as above who presents with 1 month, unable nasal congestion, cough with productive yellow sputum, intermittent left arm pain.  She was treated with azithromycin.  On a cruise when symptoms developed earlier this month.  Her roommates also developed similar symptoms.  She has had no chest pain, shortness of breath, nausea, diaphoresis, leg pain or swelling.  On physical exam vitals are stable and she is in no acute distress.  Initial labs were hemolyzed, repeat days or within normal limits.  She has no urinary symptoms, I do not feel UA is consistent with infection and is contaminated.  Chest x-ray is normal.  Troponin is negative.  EKG shows no acute ischemic changes.  Legionella antigen sent, the patient has already undergone treatment.  We'll discharge home with plans for close outpatient follow-up with PCP.     Arbie Cookey evaluation in the Emergency Department is complete. It has been determined that no acute conditions requiring further emergency intervention are present at this time. The patient/guardian have been advised of the diagnosis and plan. We have discussed signs and symptoms that warrant return to the ED, such as changes or worsening in symptoms, worsening pain, fever, trouble breathing.    I personally performed the services described in this documentation, which was scribed in my presence. The recorded information has been reviewed and is accurate.     Ernestina Patches,  MD 12/20/14 [phone]

## 2014-12-19 NOTE — ED Notes (Signed)
Pt reports chest congestion, productive cough with yellow sputum, left arm pain, nasal congestion x1 month, worsened over the past week.

## 2014-12-19 NOTE — ED Notes (Signed)
Lab collected by RT per arterial draw per EDP order

## 2014-12-19 NOTE — Discharge Instructions (Signed)

## 2014-12-19 NOTE — ED Notes (Signed)
Pt reports cold like symptoms while on a cruise - pt was on a cruise from 2/1-2/14 - pt was by the physician on the cruise, was given a z-pack, "a spray" and some cough medication - pt admits to some improvement s/p starting the z-pack. Pt states she has continued to experience symptoms and is now also experiencing fatigue and weakness.

## 2014-12-20 LAB — LEGIONELLA ANTIGEN, URINE

## 2014-12-22 ENCOUNTER — Telehealth (HOSPITAL_BASED_OUTPATIENT_CLINIC_OR_DEPARTMENT_OTHER): Payer: Self-pay | Admitting: Emergency Medicine

## 2014-12-23 ENCOUNTER — Ambulatory Visit: Payer: Medicare Other | Admitting: Family Medicine

## 2014-12-27 ENCOUNTER — Ambulatory Visit (INDEPENDENT_AMBULATORY_CARE_PROVIDER_SITE_OTHER): Payer: Medicare Other | Admitting: Family Medicine

## 2014-12-27 ENCOUNTER — Encounter: Payer: Self-pay | Admitting: Family Medicine

## 2014-12-27 VITALS — BP 124/64 | HR 81 | Temp 97.9°F | Resp 16 | Wt 159.1 lb

## 2014-12-27 DIAGNOSIS — J069 Acute upper respiratory infection, unspecified: Secondary | ICD-10-CM | POA: Diagnosis not present

## 2014-12-27 LAB — URINALYSIS, ROUTINE W REFLEX MICROSCOPIC
BILIRUBIN URINE: NEGATIVE
Hgb urine dipstick: NEGATIVE
Ketones, ur: NEGATIVE
LEUKOCYTES UA: NEGATIVE
NITRITE: NEGATIVE
RBC / HPF: NONE SEEN (ref 0–?)
Specific Gravity, Urine: 1.025 (ref 1.000–1.030)
Total Protein, Urine: NEGATIVE
Urine Glucose: NEGATIVE
Urobilinogen, UA: 0.2 (ref 0.0–1.0)
WBC UA: NONE SEEN (ref 0–?)
pH: 5.5 (ref 5.0–8.0)

## 2014-12-27 LAB — VITAMIN D 25 HYDROXY (VIT D DEFICIENCY, FRACTURES): VITD: 30.18 ng/mL (ref 30.00–100.00)

## 2014-12-27 LAB — PHOSPHORUS: Phosphorus: 3.5 mg/dL (ref 2.3–4.6)

## 2014-12-27 LAB — MAGNESIUM: Magnesium: 1.8 mg/dL (ref 1.5–2.5)

## 2014-12-27 NOTE — Patient Instructions (Signed)
Follow up as needed Start OTC Claritin or Zyrtec daily for the nasal congestion and post-nasal drip (this is likely causing your sore throat and cough) Drink plenty of fluids REST! We'll notify you of your lab results and make any changes if needed Call with any questions or concerns Hang in there!

## 2014-12-27 NOTE — Progress Notes (Signed)
Pre visit review using our clinic review tool, if applicable. No additional management support is needed unless otherwise documented below in the visit note. 

## 2014-12-27 NOTE — Progress Notes (Signed)
   Subjective:    Patient ID: Dominique [name], female    DOB: [birth year], 74 y.o.   MRN: [id number]  HPI URI- pt reports 4 weeks of cold sxs.  Was on cruise when sxs started and 'took a Zpack right away'.  Reports she was in bed for 10 days after returning from trip.  Went to ER and was tx'd w/ IV for dehydration.  Pt had low serum Ca but Ca oxalate crystals in urine.  Pt continues to complain of sore throat, cough- but both of these are better today than previous.  No fevers.  Denies urinary frequency, dysuria.   Review of Systems For ROS see HPI     Objective:   Physical Exam  Constitutional: She appears well-developed and well-nourished. No distress.  HENT:  Head: Normocephalic and atraumatic.  Right Ear: Tympanic membrane normal.  Left Ear: Tympanic membrane normal.  Nose: Mucosal edema and rhinorrhea present. Right sinus exhibits no maxillary sinus tenderness and no frontal sinus tenderness. Left sinus exhibits no maxillary sinus tenderness and no frontal sinus tenderness.  Mouth/Throat: Mucous membranes are normal. Posterior oropharyngeal erythema (w/ PND) present.  Eyes: Conjunctivae and EOM are normal. Pupils are equal, round, and reactive to light.  Neck: Normal range of motion. Neck supple.  Cardiovascular: Normal rate, regular rhythm and normal heart sounds.   Pulmonary/Chest: Effort normal and breath sounds normal. No respiratory distress. She has no wheezes. She has no rales.  Lymphadenopathy:    She has no cervical adenopathy.  Vitals reviewed.         Assessment & Plan:

## 2014-12-28 LAB — PTH, INTACT AND CALCIUM
CALCIUM: 9.7 mg/dL (ref 8.4–10.5)
PTH: 36 pg/mL (ref 14–64)

## 2014-12-28 NOTE — Assessment & Plan Note (Signed)
New.  Noted on labs done in ER.  Pt is asymptomatic today but will repeat to determine if this is cause of pt's fatigue.

## 2014-12-28 NOTE — Assessment & Plan Note (Addendum)
New.  Pt has been treated appropriately for bacterial infection and she has no evidence of recurrence on exam today.  sxs are consistent w/ viral/allergy combo.  Start OTC antihistamine.  Reviewed supportive care and red flags that should prompt return.  Pt expressed understanding and is in agreement w/ plan.

## 2014-12-29 ENCOUNTER — Encounter: Payer: Self-pay | Admitting: General Practice

## 2015-03-28 ENCOUNTER — Ambulatory Visit: Payer: Medicare Other | Admitting: Family Medicine

## 2015-03-29 ENCOUNTER — Ambulatory Visit: Payer: Medicare Other | Admitting: Family Medicine

## 2015-03-30 ENCOUNTER — Ambulatory Visit (INDEPENDENT_AMBULATORY_CARE_PROVIDER_SITE_OTHER): Payer: Medicare Other | Admitting: Family Medicine

## 2015-03-30 ENCOUNTER — Encounter: Payer: Self-pay | Admitting: Family Medicine

## 2015-03-30 ENCOUNTER — Ambulatory Visit (HOSPITAL_BASED_OUTPATIENT_CLINIC_OR_DEPARTMENT_OTHER)
Admission: RE | Admit: 2015-03-30 | Discharge: 2015-03-30 | Disposition: A | Discharge status: Home / Self Care | Payer: Medicare Other | Source: Ambulatory Visit | Attending: Family Medicine | Admitting: Family Medicine

## 2015-03-30 ENCOUNTER — Other Ambulatory Visit: Payer: Self-pay | Admitting: General Practice

## 2015-03-30 VITALS — BP 122/76 | HR 101 | Temp 97.9°F | Resp 16 | Wt 156.1 lb

## 2015-03-30 DIAGNOSIS — J439 Emphysema, unspecified: Secondary | ICD-10-CM | POA: Diagnosis not present

## 2015-03-30 DIAGNOSIS — Z86010 Personal history of colonic polyps: Secondary | ICD-10-CM

## 2015-03-30 DIAGNOSIS — R0602 Shortness of breath: Secondary | ICD-10-CM | POA: Diagnosis not present

## 2015-03-30 DIAGNOSIS — I10 Essential (primary) hypertension: Secondary | ICD-10-CM | POA: Diagnosis not present

## 2015-03-30 DIAGNOSIS — M7989 Other specified soft tissue disorders: Secondary | ICD-10-CM | POA: Diagnosis not present

## 2015-03-30 DIAGNOSIS — Z0100 Encounter for examination of eyes and vision without abnormal findings: Secondary | ICD-10-CM

## 2015-03-30 DIAGNOSIS — M79661 Pain in right lower leg: Secondary | ICD-10-CM | POA: Diagnosis not present

## 2015-03-30 DIAGNOSIS — Z1283 Encounter for screening for malignant neoplasm of skin: Secondary | ICD-10-CM

## 2015-03-30 DIAGNOSIS — E785 Hyperlipidemia, unspecified: Secondary | ICD-10-CM

## 2015-03-30 DIAGNOSIS — R600 Localized edema: Secondary | ICD-10-CM

## 2015-03-30 DIAGNOSIS — M79671 Pain in right foot: Secondary | ICD-10-CM | POA: Diagnosis not present

## 2015-03-30 LAB — CBC WITH DIFFERENTIAL/PLATELET
BASOS ABS: 0.1 10*3/uL (ref 0.0–0.1)
Basophils Relative: 0.8 % (ref 0.0–3.0)
Eosinophils Absolute: 0.6 10*3/uL (ref 0.0–0.7)
Eosinophils Relative: 7.5 % — ABNORMAL HIGH (ref 0.0–5.0)
HEMATOCRIT: 42 % (ref 36.0–46.0)
Hemoglobin: 13.9 g/dL (ref 12.0–15.0)
LYMPHS PCT: 26.6 % (ref 12.0–46.0)
Lymphs Abs: 2.3 10*3/uL (ref 0.7–4.0)
MCHC: 33.2 g/dL (ref 30.0–36.0)
MCV: 92 fl (ref 78.0–100.0)
MONOS PCT: 8.1 % (ref 3.0–12.0)
Monocytes Absolute: 0.7 10*3/uL (ref 0.1–1.0)
NEUTROS PCT: 57 % (ref 43.0–77.0)
Neutro Abs: 4.8 10*3/uL (ref 1.4–7.7)
PLATELETS: 372 10*3/uL (ref 150.0–400.0)
RBC: 4.56 Mil/uL (ref 3.87–5.11)
RDW: 14.1 % (ref 11.5–15.5)
WBC: 8.5 10*3/uL (ref 4.0–10.5)

## 2015-03-30 LAB — BASIC METABOLIC PANEL
BUN: 18 mg/dL (ref 6–23)
CO2: 28 meq/L (ref 19–32)
Calcium: 9.3 mg/dL (ref 8.4–10.5)
Chloride: 104 mEq/L (ref 96–112)
Creatinine, Ser: 0.8 mg/dL (ref 0.40–1.20)
GFR: 74.34 mL/min (ref >60.00)
GLUCOSE: 76 mg/dL (ref 70–99)
Potassium: 4.3 mEq/L (ref 3.5–5.1)
Sodium: 137 mEq/L (ref 135–145)

## 2015-03-30 LAB — HEPATIC FUNCTION PANEL
ALT: 18 U/L (ref 0–35)
AST: 23 U/L (ref 0–37)
Albumin: 3.9 g/dL (ref 3.5–5.2)
Alkaline Phosphatase: 60 U/L (ref 39–117)
Bilirubin, Direct: 0.1 mg/dL (ref 0.0–0.3)
TOTAL PROTEIN: 7.4 g/dL (ref 6.0–8.3)
Total Bilirubin: 0.4 mg/dL (ref 0.2–1.2)

## 2015-03-30 LAB — LIPID PANEL
CHOL/HDL RATIO: 3
Cholesterol: 186 mg/dL (ref 0–200)
HDL: 69 mg/dL (ref >39.00)
LDL CALC: 101 mg/dL — AB (ref 0–99)
NONHDL: 117
Triglycerides: 82 mg/dL (ref 0.0–149.0)
VLDL: 16.4 mg/dL (ref 0.0–40.0)

## 2015-03-30 MED ORDER — AMLODIPINE BESYLATE 5 MG PO TABS: 5.0000 mg | ORAL_TABLET | Freq: Every day | ORAL | Status: DC

## 2015-03-30 MED ORDER — FUROSEMIDE 20 MG PO TABS: 20.0000 mg | ORAL_TABLET | Freq: Every day | ORAL | Status: DC

## 2015-03-30 MED ORDER — ALBUTEROL SULFATE HFA 108 (90 BASE) MCG/ACT IN AERS: 2.0000 | INHALATION_SPRAY | Freq: Four times a day (QID) | RESPIRATORY_TRACT | Status: DC | PRN

## 2015-03-30 MED ORDER — BENAZEPRIL HCL 20 MG PO TABS: 20.0000 mg | ORAL_TABLET | Freq: Every day | ORAL | Status: DC

## 2015-03-30 MED ORDER — BUDESONIDE-FORMOTEROL FUMARATE 80-4.5 MCG/ACT IN AERO: 2.0000 | INHALATION_SPRAY | Freq: Two times a day (BID) | RESPIRATORY_TRACT | Status: DC

## 2015-03-30 MED ORDER — PRAVASTATIN SODIUM 20 MG PO TABS: 20.0000 mg | ORAL_TABLET | Freq: Every day | ORAL | Status: DC

## 2015-03-30 NOTE — Progress Notes (Signed)
Pre visit review using our clinic review tool, if applicable. No additional management support is needed unless otherwise documented below in the visit note. 

## 2015-03-30 NOTE — Patient Instructions (Signed)
Schedule your complete physical in 6 months We'll notify you of your lab results and ultrasound results for the leg swelling We'll call you with your pulmonary, GI, dermatology and eye appts START the Symbicort- 2 puffs twice daily Use the albuterol inhaler as needed for shortness of breath Call with any questions or concerns Hang in there!!

## 2015-03-30 NOTE — Progress Notes (Signed)
   Subjective:    Patient ID: Dominique [name], female    DOB: [birth year], 74 y.o.   MRN: [id number]  HPI HTN- chronic problem, on Amlodipine and Benazepril w/ good control.  No CP.  HAs, visual changes.   Hyperlipidemia- chronic problem, on Pravastatin.  No abd pain, N/V, myalgias.  Emphysema- chronic problem.  Pt stopped smoking in 2005 and has been asymptomatic since then until recently.  Pt reports '3 really major colds in the last 6 months'.  Pt is now concerned about her breathing.  Pt reports increased SOB when walking- particularly if she tries to talk.  Pt thinks she was on Symbicort previously along w/ Albuterol for rescue use.  Pt would like to see pulmonary.  R LE edema- pt reports swelling after excessive walking this weekend.  Paid close attention to salt intake.  Swelling will improve w/ elevation.  She does have a varicosity of R lower leg.  Recall colonoscopy- pt reports she is due and would like referral.  Review of Systems For ROS see HPI     Objective:   Physical Exam  Constitutional: She is oriented to person, place, and time. She appears well-developed and well-nourished. No distress.  HENT:  Head: Normocephalic and atraumatic.  Eyes: Conjunctivae and EOM are normal. Pupils are equal, round, and reactive to light.  Neck: Normal range of motion. Neck supple. No thyromegaly present.  Cardiovascular: Normal rate, regular rhythm, normal heart sounds and intact distal pulses.   No murmur heard. Pulmonary/Chest: Effort normal and breath sounds normal. No respiratory distress.  Abdominal: Soft. She exhibits no distension. There is no tenderness.  Musculoskeletal: She exhibits edema (1+ pitting R LE edema to level of ankle) and tenderness (mild TTP over R LE varicose vein).  Lymphadenopathy:    She has no cervical adenopathy.  Neurological: She is alert and oriented to person, place, and time.  Skin: Skin is warm and dry.  Psychiatric: She has a normal mood and affect. Her  behavior is normal.  Vitals reviewed.         Assessment & Plan:

## 2015-04-03 NOTE — Assessment & Plan Note (Signed)
New.  Unclear if this is due to recent activity, salt intake, hx of surgery on R side, R LE varicosity, or blood clot.  Get [place] to assess.  Check labs.  Encouraged elevation.  Will start lasix PRN if labs WNL.  Pt expressed understanding and is in agreement w/ plan.

## 2015-04-03 NOTE — Assessment & Plan Note (Signed)
Chronic problem.  Tolerating statin w/o difficulty.  Check labs.  Adjust meds prn  

## 2015-04-03 NOTE — Assessment & Plan Note (Signed)
Chronic problem.  Adequate control.  Asymptomatic w/ exception of R lower leg swelling and some mild increased SOB (likely due to her COPD).  Check labs.  No expected medication change.

## 2015-04-03 NOTE — Assessment & Plan Note (Signed)
Pt has been asymptomatic for years but has recently had multiple URIs and now having some increased SOB, particularly w/ exertion.  Previously on Symbicort- will restart.  Albuterol prn.  Refer to pulmonary at pt's request.  Pt expressed understanding and is in agreement w/ plan.

## 2015-04-04 DIAGNOSIS — G5761 Lesion of plantar nerve, right lower limb: Secondary | ICD-10-CM | POA: Diagnosis not present

## 2015-04-04 DIAGNOSIS — M84374A Stress fracture, right foot, initial encounter for fracture: Secondary | ICD-10-CM | POA: Diagnosis not present

## 2015-04-04 DIAGNOSIS — E559 Vitamin D deficiency, unspecified: Secondary | ICD-10-CM | POA: Diagnosis not present

## 2015-04-05 DIAGNOSIS — Z1231 Encounter for screening mammogram for malignant neoplasm of breast: Secondary | ICD-10-CM | POA: Diagnosis not present

## 2015-04-05 DIAGNOSIS — Z01419 Encounter for gynecological examination (general) (routine) without abnormal findings: Secondary | ICD-10-CM | POA: Diagnosis not present

## 2015-04-05 DIAGNOSIS — Z6825 Body mass index (BMI) 25.0-25.9, adult: Secondary | ICD-10-CM | POA: Diagnosis not present

## 2015-04-05 DIAGNOSIS — N390 Urinary tract infection, site not specified: Secondary | ICD-10-CM | POA: Diagnosis not present

## 2015-04-05 LAB — HM MAMMOGRAPHY: HM MAMMO: NORMAL

## 2015-04-11 DIAGNOSIS — M84374D Stress fracture, right foot, subsequent encounter for fracture with routine healing: Secondary | ICD-10-CM | POA: Diagnosis not present

## 2015-04-11 DIAGNOSIS — E559 Vitamin D deficiency, unspecified: Secondary | ICD-10-CM | POA: Diagnosis not present

## 2015-04-12 ENCOUNTER — Telehealth: Payer: Self-pay | Admitting: *Deleted

## 2015-04-12 ENCOUNTER — Ambulatory Visit (INDEPENDENT_AMBULATORY_CARE_PROVIDER_SITE_OTHER): Payer: Medicare Other | Admitting: Internal Medicine

## 2015-04-12 ENCOUNTER — Encounter: Payer: Self-pay | Admitting: Internal Medicine

## 2015-04-12 VITALS — BP 120/66 | HR 85 | Ht 66.0 in | Wt 156.2 lb

## 2015-04-12 DIAGNOSIS — R0600 Dyspnea, unspecified: Secondary | ICD-10-CM | POA: Diagnosis not present

## 2015-04-12 DIAGNOSIS — J438 Other emphysema: Secondary | ICD-10-CM

## 2015-04-12 DIAGNOSIS — I10 Essential (primary) hypertension: Secondary | ICD-10-CM | POA: Diagnosis not present

## 2015-04-12 DIAGNOSIS — R0609 Other forms of dyspnea: Secondary | ICD-10-CM

## 2015-04-12 MED ORDER — VALSARTAN 160 MG PO TABS: 160.0000 mg | ORAL_TABLET | Freq: Every day | ORAL | Status: DC

## 2015-04-12 NOTE — Telephone Encounter (Signed)
LMTCB

## 2015-04-12 NOTE — Assessment & Plan Note (Addendum)
Symptoms are markedly disproportionate to objective findings and not clear this is a lung problem but pt does appear to have difficult airway management issues. DDX of  difficult airways management all start with A and  include Adherence, Ace Inhibitors, Acid Reflux, Active Sinus Disease, Alpha 1 Antitripsin deficiency, Anxiety masquerading as Airways dz,  ABPA,  allergy(esp in young), Aspiration (esp in elderly), Adverse effects of meds,  Active smokers, A bunch of PE's (a small clot burden can't cause this syndrome unless there is already severe underlying pulm or vascular dz with poor reserve) plus two Bs  = Bronchiectasis and Beta blocker use..and one C= CHF  ACEi at top of usual list of suspects, esp with h/o "colds getting worse" x 6 m this many years p quit smoking and persistent doe only if talks when walks - only way to know is trial off > see hbp  Needs f/u with pfts x 6 weeks  I had an extended discussion with the patient reviewing all relevant studies completed to date and  lasting  93 m Each maintenance medication was reviewed in detail including most importantly the difference between maintenance and as needed and under what circumstances the prns are to be used.  Please see instructions for details which were reviewed in writing and the patient given a copy.   No need for maint resp meds for now / just prn saba should do

## 2015-04-12 NOTE — Assessment & Plan Note (Signed)
ACE inhibitors are problematic in  pts with airway complaints because  even experienced pulmonologists can't always distinguish ace effects from copd/asthma/pnds/ allergies etc.  By themselves they don't actually cause a problem, much like oxygen can't by itself start a fire, but they certainly serve as a powerful catalyst or enhancer for any "fire"  or inflammatory process in the upper airway, be it caused by an ET  tube or more commonly reflux (especially in the obese or pts with known GERD or who are on biphoshonates) or URI's, due to interference with bradykinin clearance.  The effects of acei on bradykinin levels occurs in 100% of pt's on acei (unless they surreptitiously stop the med!) but the classic cough is only reported in 5%.  This leaves 95% of pts on acei's  with a variety of syndromes including no identifiable symptom in most  vs non-specific symptoms that wax and wane depending on what other insult is occuring at the level of the upper airway, in this case likely what should have just been an uneventful viral uri.  Only way to sort out is trial off acei x the next 6 months and see how she does when she catches another cold, which of course is inevitable.  Try diovan 160 mg daily for now

## 2015-04-12 NOTE — Assessment & Plan Note (Signed)
  When respiratory symptoms begin or become refractory well after a patient reports complete smoking cessation,  Especially when this wasn't the case in the years immediately after she quit,  a red flag is raised based on the work of Dr Kris Mouton which states:  if you quit smoking when your best day FEV1 is still well preserved it is highly unlikely you will progress to severe disease.  That is to say, once the smoking stops,  the symptoms should not suddenly erupt or markedly worsen.  If so, the differential diagnosis should include  obesity/deconditioning,  LPR/Reflux/Aspiration syndromes,  occult CHF, or  especially side effect of medications commonly used in this population, especially ACEi   Will f/u with pfts

## 2015-04-12 NOTE — Telephone Encounter (Signed)
[phone], pt cb

## 2015-04-12 NOTE — Telephone Encounter (Signed)
Patient aware of rec's per MW - states that she did understand this and has done this.  Nothing further needed.

## 2015-04-12 NOTE — Patient Instructions (Addendum)
Stop lotensin to see what effect if any this has  Valsartan 160 mg one daily   Please schedule a follow up office visit in 6 weeks, call sooner if needed with pfts on return

## 2015-04-12 NOTE — Progress Notes (Signed)
Subjective:    Patient ID: Dominique [name], female    DOB: 1941/05/10,    MRN: [id number]  HPI  62 yowf [place] female h/o cig smoking with onset intermittent wheeze/ sob x 1996  but stopped smoking  2006 and better after that and no medications needed and moved 2014 from [place] to [facility] 2014 and then Nov 2015 began pattern of "really bad colds"  And  started using ventolin during these flares  but did not feel need to continue it referred by Dr Birdie Riddle for pulmonary evaluation  04/12/2015 with chronic/ persistent doe.  04/12/2015 1st [facility] office visit/ Dominique [name]   Chief Complaint  Patient presents with  . Pulmonary Consult    Referred by Dr. Birdie Riddle. Pt c/o wheezing, SOB and cough since Fall 2015.   back to baseline by ov s need for any inhalers, some doe x walking only if  talking - otherwise dose fine unless she has a cold (3 sep severe episodes x 6 m)   No obvious other patterns in day to day or daytime variabilty or assoc chronic cough or cp or chest tightness, subjective wheeze overt sinus or hb symptoms. No unusual exp hx or h/o childhood pna/ asthma or knowledge of premature birth.  Sleeping ok without nocturnal  or early am exacerbation  of respiratory  c/o's or need for noct saba. Also denies any obvious fluctuation of symptoms with weather or environmental changes or other aggravating or alleviating factors except as outlined above   Current Medications, Allergies, Complete Past Medical History, Past Surgical History, Family History, and Social History were reviewed in [employer] record.              Review of Systems  Constitutional: Negative for fever, chills and unexpected weight change.  HENT: Negative for congestion, dental problem, ear pain, nosebleeds, postnasal drip, rhinorrhea, sinus pressure, sneezing, sore throat, trouble swallowing and voice change.   Eyes: Negative for visual disturbance.  Respiratory: Positive for cough and  shortness of breath. Negative for choking.   Cardiovascular: Negative for chest pain and leg swelling.  Gastrointestinal: Negative for vomiting, abdominal pain and diarrhea.  Genitourinary: Negative for difficulty urinating.  Musculoskeletal: Negative for arthralgias.  Skin: Negative for rash.  Neurological: Negative for tremors, syncope and headaches.  Hematological: Does not bruise/bleed easily.       Objective:   Physical Exam  Very pleasant amb wf nad  Wt Readings from Last 3 Encounters:  04/12/15 156 lb 3.2 oz (70.852 kg)  03/30/15 156 lb 2 oz (70.818 kg)  12/27/14 159 lb 2 oz (72.179 kg)    Vital signs reviewed   HEENT: nl dentition, turbinates, and orophanx. Nl external ear canals without cough reflex   NECK :  without JVD/Nodes/TM/ nl carotid upstrokes bilaterally   LUNGS: no acc muscle use, clear to A and P bilaterally without cough on insp or exp maneuvers   CV:  RRR  no s3 or murmur or increase in P2, no edema   ABD:  soft and nontender with nl excursion in the supine position. No bruits or organomegaly, bowel sounds nl  MS:  warm without deformities, calf tenderness, cyanosis or clubbing  SKIN: warm and dry without lesions    NEURO:  alert, approp, no deficits    I personally reviewed images and agree with radiology impression as follows:  CXR:  12/19/14  No radiographic evidence of acute cardiopulmonary disease  Assessment & Plan:

## 2015-04-12 NOTE — Telephone Encounter (Signed)
-----   Message from Tanda Rockers, MD sent at 04/12/2015  1:50 PM EDT ----- Make sure she understands to start the valsartan in place of the lotensin

## 2015-04-14 ENCOUNTER — Telehealth: Payer: Self-pay | Admitting: Family Medicine

## 2015-04-14 DIAGNOSIS — E559 Vitamin D deficiency, unspecified: Secondary | ICD-10-CM

## 2015-04-14 MED ORDER — VITAMIN D (ERGOCALCIFEROL) 1.25 MG (50000 UNIT) PO CAPS: 50000.0000 [IU] | ORAL_CAPSULE | ORAL | Status: DC

## 2015-04-14 NOTE — Telephone Encounter (Signed)
Left message for pt to return my call.

## 2015-04-14 NOTE — Telephone Encounter (Signed)
Caller name: Naylea Relation to pt: self Call back number: [phone] Pharmacy: [facility] [address]  Reason for call:   Patient states that she has three broken toes and would like to know if she could start prescribing her Vit D.

## 2015-04-14 NOTE — Telephone Encounter (Signed)
Notified pt. She states podiatrist (Dr Gershon Mussel [phone]) checked vitamin D and result was 26.4. Scheduled lab appt for 07/07/15 at 1:30pm. Future lab order entered. Rx sent to pharmacy. Requested vitamin D result from podiatrist.

## 2015-04-14 NOTE — Telephone Encounter (Signed)
Vita d 50000 u weekly #4  2 refills --- recheck vita d level in 3 months if insurance allows

## 2015-04-18 ENCOUNTER — Telehealth: Payer: Self-pay | Admitting: Internal Medicine

## 2015-04-18 MED ORDER — VALSARTAN 160 MG PO TABS: 160.0000 mg | ORAL_TABLET | Freq: Every day | ORAL | Status: DC

## 2015-04-18 NOTE — Telephone Encounter (Signed)
Per 04/12/15: Patient Instructions       Stop lotensin to see what effect if any this has Valsartan 160 mg one daily  Please schedule a follow up office visit in 6 weeks, call sooner if needed with pfts on return    --  Called pt and aware RX for valsartan sent in. Nothing further needed

## 2015-04-25 DIAGNOSIS — H10413 Chronic giant papillary conjunctivitis, bilateral: Secondary | ICD-10-CM | POA: Diagnosis not present

## 2015-04-25 DIAGNOSIS — H33321 Round hole, right eye: Secondary | ICD-10-CM | POA: Diagnosis not present

## 2015-04-26 DIAGNOSIS — M84374D Stress fracture, right foot, subsequent encounter for fracture with routine healing: Secondary | ICD-10-CM | POA: Diagnosis not present

## 2015-04-26 DIAGNOSIS — G5761 Lesion of plantar nerve, right lower limb: Secondary | ICD-10-CM | POA: Diagnosis not present

## 2015-05-12 DIAGNOSIS — D225 Melanocytic nevi of trunk: Secondary | ICD-10-CM | POA: Diagnosis not present

## 2015-05-12 DIAGNOSIS — D1801 Hemangioma of skin and subcutaneous tissue: Secondary | ICD-10-CM | POA: Diagnosis not present

## 2015-05-12 DIAGNOSIS — L821 Other seborrheic keratosis: Secondary | ICD-10-CM | POA: Diagnosis not present

## 2015-05-12 DIAGNOSIS — R238 Other skin changes: Secondary | ICD-10-CM | POA: Diagnosis not present

## 2015-05-12 DIAGNOSIS — L814 Other melanin hyperpigmentation: Secondary | ICD-10-CM | POA: Diagnosis not present

## 2015-05-15 ENCOUNTER — Emergency Department (HOSPITAL_BASED_OUTPATIENT_CLINIC_OR_DEPARTMENT_OTHER)
Admission: EM | Admit: 2015-05-15 | Discharge: 2015-05-15 | Disposition: A | Discharge status: Home / Self Care | Payer: Medicare Other | Attending: Emergency Medicine | Admitting: Emergency Medicine

## 2015-05-15 ENCOUNTER — Emergency Department (HOSPITAL_BASED_OUTPATIENT_CLINIC_OR_DEPARTMENT_OTHER): Payer: Medicare Other

## 2015-05-15 ENCOUNTER — Encounter (HOSPITAL_BASED_OUTPATIENT_CLINIC_OR_DEPARTMENT_OTHER): Payer: Self-pay | Admitting: *Deleted

## 2015-05-15 DIAGNOSIS — R011 Cardiac murmur, unspecified: Secondary | ICD-10-CM | POA: Diagnosis not present

## 2015-05-15 DIAGNOSIS — Z87440 Personal history of urinary (tract) infections: Secondary | ICD-10-CM | POA: Diagnosis not present

## 2015-05-15 DIAGNOSIS — Z7982 Long term (current) use of aspirin: Secondary | ICD-10-CM | POA: Diagnosis not present

## 2015-05-15 DIAGNOSIS — Z79899 Other long term (current) drug therapy: Secondary | ICD-10-CM | POA: Insufficient documentation

## 2015-05-15 DIAGNOSIS — I10 Essential (primary) hypertension: Secondary | ICD-10-CM | POA: Insufficient documentation

## 2015-05-15 DIAGNOSIS — M25552 Pain in left hip: Secondary | ICD-10-CM | POA: Diagnosis present

## 2015-05-15 DIAGNOSIS — Q249 Congenital malformation of heart, unspecified: Secondary | ICD-10-CM | POA: Diagnosis not present

## 2015-05-15 DIAGNOSIS — Z87891 Personal history of nicotine dependence: Secondary | ICD-10-CM | POA: Diagnosis not present

## 2015-05-15 DIAGNOSIS — Z471 Aftercare following joint replacement surgery: Secondary | ICD-10-CM | POA: Diagnosis not present

## 2015-05-15 DIAGNOSIS — Z8709 Personal history of other diseases of the respiratory system: Secondary | ICD-10-CM | POA: Diagnosis not present

## 2015-05-15 DIAGNOSIS — M5432 Sciatica, left side: Secondary | ICD-10-CM

## 2015-05-15 DIAGNOSIS — Z96643 Presence of artificial hip joint, bilateral: Secondary | ICD-10-CM | POA: Diagnosis not present

## 2015-05-15 DIAGNOSIS — Z96642 Presence of left artificial hip joint: Secondary | ICD-10-CM | POA: Diagnosis not present

## 2015-05-15 MED ORDER — HYDROCODONE-ACETAMINOPHEN 5-325 MG PO TABS: 1.0000 | ORAL_TABLET | Freq: Four times a day (QID) | ORAL | Status: DC | PRN

## 2015-05-15 MED ORDER — HYDROCODONE-ACETAMINOPHEN 5-325 MG PO TABS
2.0000 | ORAL_TABLET | Freq: Once | ORAL | Status: AC
Start: 2015-05-15 — End: 2015-05-15
  Administered 2015-05-15: 2 via ORAL
  Filled 2015-05-15: qty 2

## 2015-05-15 NOTE — ED Provider Notes (Signed)
CSN: [id number]     Arrival date & time 05/15/15  1114 History   First MD Initiated Contact with Patient 05/15/15 1120     Chief Complaint  Patient presents with  . Hip Pain     (Consider location/radiation/quality/duration/timing/severity/associated sxs/prior Treatment) HPI  74 year old female presents with left buttock pain that has been ongoing for the past 6 days. Progressively worse to the point that she needed to be evaluated. Pain is pretty constant. She has had her left hip replaced and she is concerned that that is going on. She has no fevers. The pain radiates from her buttocks all the way down to her feet on the posterior aspect of her leg. There is numbness on the lateral 3 toes of her left foot but no foot numbness. This numbness and tingling comes and goes. Patient denies any weakness in her extremities. Walking makes the pain worse. Denies any back pain. No trauma. No prior issues with sciatica.  Past Medical History  Diagnosis Date  . Emphysema lung   . Cardiac arrhythmia due to congenital heart disease   . Heart murmur   . Hypertension   . History of blood transfusion 1971    with C-section complication  . UTI (lower urinary tract infection)    Past Surgical History  Procedure Laterality Date  . Appendectomy  1951  . Cesarean section      1971  . Tubal ligation  1975  . Laminectomy    . Total hip arthroplasty Bilateral   . Cataract extraction    . Joint replacement     Family History  Problem Relation Age of Onset  . Cancer Mother     breast  . Cancer Brother     lung  . Diabetes Son     type 1   History  Substance Use Topics  . Smoking status: Former Smoker -- 1.00 packs/day for 40 years    Types: Cigarettes    Quit date: 03/19/2004  . Smokeless tobacco: Never Used  . Alcohol Use: 0.0 oz/week    0 Standard drinks or equivalent per week   OB History    No data available     Review of Systems  Constitutional: Negative for fever.    Gastrointestinal: Negative for abdominal pain.  Musculoskeletal: Positive for arthralgias. Negative for back pain.  Neurological: Positive for numbness. Negative for weakness.  All other systems reviewed and are negative.     Allergies  Review of patient's allergies indicates no known allergies.  Home Medications   Prior to Admission medications   Medication Sig Start Date End Date Taking? Authorizing Provider  amLODipine (NORVASC) 5 MG tablet Take 1 tablet (5 mg total) by mouth daily. 03/30/15   Midge Minium, MD  aspirin EC 81 MG tablet Take 81 mg by mouth daily.    Historical Provider, MD  Calcium-Vitamin D-Vitamin K (VIACTIV PO) Take by mouth.    Historical Provider, MD  Multiple Vitamin (MULTIVITAMIN) tablet Take 1 tablet by mouth daily.    Historical Provider, MD  pravastatin (PRAVACHOL) 20 MG tablet Take 1 tablet (20 mg total) by mouth daily. 03/30/15   Midge Minium, MD  valsartan (DIOVAN) 160 MG tablet Take 1 tablet (160 mg total) by mouth daily. 04/18/15   Tanda Rockers, MD  Vitamin D, Ergocalciferol, (DRISDOL) 50000 UNITS CAPS capsule Take 1 capsule (50,000 Units total) by mouth every 7 (seven) days. 04/14/15   Yvonne R Lowne, DO   BP 142/80 mmHg  Pulse 82  Temp(Src) 98.2 F (36.8 C) (Oral)  Resp 16  Ht 5\' 6"  (1.676 m)  Wt 154 lb (69.854 kg)  BMI 24.87 kg/m2  SpO2 97% Physical Exam  Constitutional: She is oriented to person, place, and time. She appears well-developed and well-nourished.  HENT:  Head: Normocephalic and atraumatic.  Right Ear: External ear normal.  Left Ear: External ear normal.  Nose: Nose normal.  Eyes: Right eye exhibits no discharge. Left eye exhibits no discharge.  Cardiovascular: Normal rate, regular rhythm and normal heart sounds.   Pulmonary/Chest: Effort normal and breath sounds normal.  Abdominal: Soft. She exhibits no distension. There is no tenderness.  Musculoskeletal:       Left hip: She exhibits normal range of motion and no  tenderness.       Legs: No pain with ROM of left hip  Neurological: She is alert and oriented to person, place, and time. She displays no Babinski's sign on the right side. She displays no Babinski's sign on the left side.  Reflex Scores:      Patellar reflexes are 2+ on the right side and 2+ on the left side.      Achilles reflexes are 2+ on the right side and 2+ on the left side. 5/5 strength in bilateral lower extremities. Grossly normal sensation  Skin: Skin is warm and dry.  Nursing note and vitals reviewed.   ED Course  Procedures (including critical care time) Labs Review Labs Reviewed - No data to display  Imaging Review Dg Hip Unilat With Pelvis 2-3 Views Left  05/15/2015   CLINICAL DATA:  Bilateral hip replacements in 2011. Left hip pain. Most pain in the lower part of the buttocks since Monday. No recent injury.  EXAM: DG HIP (WITH OR WITHOUT PELVIS) 2-3V LEFT  COMPARISON:  None.  FINDINGS: Patient has had left hip arthroplasty. The hardware appears well seated. No lucency to indicate loosening or infection. No acute fractures.  IMPRESSION: 1. Status post left hip arthroplasty. 2.  No evidence for acute  abnormality.   Electronically Signed   By: Nolon Nations M.D.   On: 05/15/2015 11:55     EKG Interpretation None      MDM   Final diagnoses:  Left sided sciatica    Patient symptoms are consistent with left-sided sciatica. Her neuro exam is normal, including she is able to get up and walk around without significant difficulty. Strength testing is normal. Highly doubt cauda equina or other acute spinal emergency. The patient appears to have moderate pain, at this point will treat with narcotics in addition to Tylenol. Patient's hip appears normal and no significant abnormality on the x-ray. At this point will treat with oral pain control, exercises, and recommend follow-up with PCP.    Sherwood Gambler, MD 05/15/15 1224

## 2015-05-15 NOTE — ED Notes (Signed)
Pt reports left hip pain started on Monday with generalized soreness that has progressively gotten worse. Limited ambulation and pain shooting down to the lt foot. Pt has artifical lt.  hip .

## 2015-05-15 NOTE — ED Notes (Signed)
Pt able to stand and walk w/o assistance, states pain increases when ambulating, pain originates at base of left buttocks and radiates down to left foot. EDP informed of assessment

## 2015-05-15 NOTE — ED Notes (Signed)
Pt discharges at 1233, pain at that time was  3/10

## 2015-06-06 ENCOUNTER — Encounter: Payer: Self-pay | Admitting: Internal Medicine

## 2015-06-06 ENCOUNTER — Ambulatory Visit (INDEPENDENT_AMBULATORY_CARE_PROVIDER_SITE_OTHER): Payer: Medicare Other | Admitting: Internal Medicine

## 2015-06-06 VITALS — BP 132/82 | HR 60 | Ht 66.0 in | Wt 156.8 lb

## 2015-06-06 DIAGNOSIS — I10 Essential (primary) hypertension: Secondary | ICD-10-CM | POA: Diagnosis not present

## 2015-06-06 DIAGNOSIS — M25552 Pain in left hip: Secondary | ICD-10-CM | POA: Diagnosis not present

## 2015-06-06 DIAGNOSIS — M25551 Pain in right hip: Secondary | ICD-10-CM

## 2015-06-06 DIAGNOSIS — R0600 Dyspnea, unspecified: Secondary | ICD-10-CM

## 2015-06-06 NOTE — Progress Notes (Addendum)
Subjective:    Patient ID: Dominique [name], female    DOB: [birth year],    MRN: [id number]    Brief patient profile:  89 yowf [place] female h/o cig smoking with onset intermittent wheeze/ sob x 1996  but stopped smoking  2006 and better after that and no medications needed and moved 2014 from [place] to [facility] 2014 and then Nov 2015 began pattern of "really bad colds"  And  started using ventolin during these flares  but did not feel need to continue it referred by Dr Birdie Riddle for pulmonary evaluation  04/12/2015 with chronic/ persistent doe.    History of Present Illness  04/12/2015 1st [facility] office visit/ Gwynneth Fabio   Chief Complaint  Patient presents with  . Pulmonary Consult    Referred by Dr. Birdie Riddle. Pt c/o wheezing, SOB and cough since Fall 2015.   back to baseline by ov s need for any inhalers, some doe x walking only if  talking - otherwise dose fine unless she has a cold (3 sep severe episodes x 6 m)  rec Stop lotensin to see what effect if any this has Valsartan 160 mg one daily  Please schedule a follow up office visit in 6 weeks, call sooner if needed with pfts on return     06/06/2015  Acute  ov/Carrol Bondar re: bilateral hip pain attributed to valsartan (< 3% per up to date)  Chief Complaint  Patient presents with  . Acute Visit    Pt c/o leg pain and stiffness since last visit- she wonders if valsartan is causing- she d/c'ed med 5 days ago and started back on ACE inhibitor. She states her breathing is unchanged "I never have a problem unless I have a cold".   nset was w/in 2 weeks of ov porgressively worse L > R to ER 05/15/15 - in meantime  no cough or sob and hip pain has improved a little since stopped the arb 5 days prior to OV . Has stiffness in hips if sits and pain in hips/back and when wt bears some better with norco- by ER with dx sciatica" but denies ongoing  radicular features or other joint involvement  Not limited by breathing from desired activities    No  obvious day to day or daytime variability or assoc chronic cough or cp or chest tightness, subjective wheeze or overt sinus or hb symptoms. No unusual exp hx or h/o childhood pna/ asthma or knowledge of premature birth.  Sleeping ok without nocturnal  or early am exacerbation  of respiratory  c/o's or need for noct saba. Also denies any obvious fluctuation of symptoms with weather or environmental changes or other aggravating or alleviating factors except as outlined above   Current Medications, Allergies, Complete Past Medical History, Past Surgical History, Family History, and Social History were reviewed in [employer] record.  ROS  The following are not active complaints unless bolded sore throat, dysphagia, dental problems, itching, sneezing,  nasal congestion or excess/ purulent secretions, ear ache,   fever, chills, sweats, unintended wt loss, classically pleuritic or exertional cp, hemoptysis,  orthopnea pnd or leg swelling, presyncope, palpitations, abdominal pain, anorexia, nausea, vomiting, diarrhea  or change in bowel or bladder habits, change in stools or urine, dysuria,hematuria,  rash, arthralgias, visual complaints, headache, numbness, weakness or ataxia or problems with walking or coordination,  change in mood/affect or memory.  Objective:   Physical Exam  Very pleasant amb wf nad   06/06/2015          157  Wt Readings from Last 3 Encounters:  04/12/15 156 lb 3.2 oz (70.852 kg)  03/30/15 156 lb 2 oz (70.818 kg)  12/27/14 159 lb 2 oz (72.179 kg)    Vital signs reviewed   HEENT: nl dentition, turbinates, and orophanx. Nl external ear canals without cough reflex   NECK :  without JVD/Nodes/TM/ nl carotid upstrokes bilaterally   LUNGS: no acc muscle use, clear to A and P bilaterally without cough on insp or exp maneuvers   CV:  RRR  no s3 or murmur or increase in P2, no edema   ABD:  soft and nontender  with nl excursion in the supine position. No bruits or organomegaly, bowel sounds nl  MS:  warm without deformities, calf tenderness, cyanosis or clubbing/ some pain both hips with int rotation which is limited / neg slr's  SKIN: warm and dry without lesions    NEURO:  alert, approp, no deficits    I personally reviewed images and agree with radiology impression as follows:  CXR:  12/19/14  No radiographic evidence of acute cardiopulmonary disease            Assessment & Plan:

## 2015-06-06 NOTE — Patient Instructions (Signed)
Continue lotensin unless/ until your next bad cold then see Dr Birdie Riddle to consider an alternative to lotensin that is not valsartan nor an  ACE inhibitor/ ARB (class of drug)

## 2015-06-07 ENCOUNTER — Encounter: Payer: Self-pay | Admitting: Internal Medicine

## 2015-06-07 DIAGNOSIS — M25552 Pain in left hip: Principal | ICD-10-CM

## 2015-06-07 DIAGNOSIS — M25551 Pain in right hip: Secondary | ICD-10-CM | POA: Insufficient documentation

## 2015-06-07 NOTE — Assessment & Plan Note (Signed)
New onset 04/2015 while on valsartan so d/c'd 06/01/15 > improved a little   I suspect she has djd in her hips that flared on arb and was therefore blamed on arb but the only way to know for sure is stop the arb until resolves then if needed for other reasons rechallenge.  I reassured her there were so many alternative to acei and arb for hbp that rechallenge should not be needed and to seek Dr Virgil Benedict advice if the hip pains aren't better to her satisfaction p 2 weeks off arb

## 2015-06-07 NOTE — Assessment & Plan Note (Addendum)
-   trial off ACEi   04/12/2015 > resumed 06/01/15 due to hip pains on valsartan  Hx = no  need for any inhalers, some doe x walking only if  talking - otherwise dose fine unless she has a cold (3 sep severe episodes x 6 m) -  When respiratory symptoms begin or become refractory well after a patient reports complete smoking cessation with resolution off all resp complaints after stops,  a red flag is raised based on the work of Dr Kris Mouton which states:  if you quit smoking when your best day FEV1 is still well preserved it is highly unlikely you will progress to severe disease.  That is to say, once the smoking stops,  the symptoms should not suddenly erupt or markedly worsen.  If so, the differential diagnosis should include  obesity/deconditioning,  LPR/Reflux/Aspiration syndromes,  occult CHF, or  especially side effect of medications commonly used in this population, esp acei.   It is possible that she is having adverse effects of arb so ok for now to go back on ACEi  However, if symptoms recur would not hesitate to d/c ACEi  > Strongly prefer in this setting: Bystolic, the most beta -1  selective Beta blocker available in sample form, with bisoprolol the most selective generic choice  on the market.    I had an extended discussion with the patient reviewing all relevant studies completed to date and  lasting 15 to 20 minutes of a 25 minute visit    Each maintenance medication was reviewed in detail including most importantly the difference between maintenance and prns and under what circumstances the prns are to be triggered using an action plan format that is not reflected in the computer generated alphabetically organized AVS.    Please see instructions for details which were reviewed in writing and the patient given a copy highlighting the part that I personally wrote and discussed at today's ov.

## 2015-06-07 NOTE — Assessment & Plan Note (Signed)
Trial off acei 04/12/2015 > new hip pain on ARB so d/c'd 06/01/15 and restarted ACEi   Adequate control on present rx, reviewed > no change in rx needed  For now > see dyspnea

## 2015-06-08 ENCOUNTER — Ambulatory Visit (INDEPENDENT_AMBULATORY_CARE_PROVIDER_SITE_OTHER): Payer: Medicare Other | Admitting: Physician Assistant

## 2015-06-08 ENCOUNTER — Encounter: Payer: Self-pay | Admitting: Physician Assistant

## 2015-06-08 VITALS — BP 138/60 | HR 84 | Temp 97.8°F | Ht 66.0 in | Wt 156.6 lb

## 2015-06-08 DIAGNOSIS — M5430 Sciatica, unspecified side: Secondary | ICD-10-CM | POA: Diagnosis not present

## 2015-06-08 MED ORDER — METHYLPREDNISOLONE 4 MG PO TBPK: ORAL_TABLET | ORAL | Status: DC

## 2015-06-08 NOTE — Patient Instructions (Signed)
Please take the Medrol pack as directed. Limit heavy lifting or overexertion. Tylenol as needed for pain. Follow exercises below. Follow-up in clinic if symptoms are not resolving.  Sciatica with Rehab The sciatic nerve runs from the back down the leg and is responsible for sensation and control of the muscles in the back (posterior) side of the thigh, lower leg, and foot. Sciatica is a condition that is characterized by inflammation of this nerve.  SYMPTOMS   Signs of nerve damage, including numbness and/or weakness along the posterior side of the lower extremity.  Pain in the back of the thigh that may also travel down the leg.  Pain that worsens when sitting for long periods of time.  Occasionally, pain in the back or buttock. CAUSES  Inflammation of the sciatic nerve is the cause of sciatica. The inflammation is due to something irritating the nerve. Common sources of irritation include:  Sitting for long periods of time.  Direct trauma to the nerve.  Arthritis of the spine.  Herniated or ruptured disk.  Slipping of the vertebrae (spondylolisthesis).  Pressure from soft tissues, such as muscles or ligament-like tissue (fascia). RISK INCREASES WITH:  Sports that place pressure or stress on the spine (football or weightlifting).  Poor strength and flexibility.  Failure to warm up properly before activity.  Family history of low back pain or disk disorders.  Previous back injury or surgery.  Poor body mechanics, especially when lifting, or poor posture. PREVENTION   Warm up and stretch properly before activity.  Maintain physical fitness:  Strength, flexibility, and endurance.  Cardiovascular fitness.  Learn and use proper technique, especially with posture and lifting. When possible, have coach correct improper technique.  Avoid activities that place stress on the spine. PROGNOSIS If treated properly, then sciatica usually resolves within 6 weeks. However,  occasionally surgery is necessary.  RELATED COMPLICATIONS   Permanent nerve damage, including pain, numbness, tingle, or weakness.  Chronic back pain.  Risks of surgery: infection, bleeding, nerve damage, or damage to surrounding tissues. TREATMENT Treatment initially involves resting from any activities that aggravate your symptoms. The use of ice and medication may help reduce pain and inflammation. The use of strengthening and stretching exercises may help reduce pain with activity. These exercises may be performed at home or with referral to a therapist. A therapist may recommend further treatments, such as transcutaneous electronic nerve stimulation (TENS) or ultrasound. Your caregiver may recommend corticosteroid injections to help reduce inflammation of the sciatic nerve. If symptoms persist despite non-surgical (conservative) treatment, then surgery may be recommended. MEDICATION  If pain medication is necessary, then nonsteroidal anti-inflammatory medications, such as aspirin and ibuprofen, or other minor pain relievers, such as acetaminophen, are often recommended.  Do not take pain medication for 7 days before surgery.  Prescription pain relievers may be given if deemed necessary by your caregiver. Use only as directed and only as much as you need.  Ointments applied to the skin may be helpful.  Corticosteroid injections may be given by your caregiver. These injections should be reserved for the most serious cases, because they may only be given a certain number of times. HEAT AND COLD  Cold treatment (icing) relieves pain and reduces inflammation. Cold treatment should be applied for 10 to 15 minutes every 2 to 3 hours for inflammation and pain and immediately after any activity that aggravates your symptoms. Use ice packs or massage the area with a piece of ice (ice massage).  Heat treatment may be  used prior to performing the stretching and strengthening activities prescribed  by your caregiver, physical therapist, or athletic trainer. Use a heat pack or soak the injury in warm water. SEEK MEDICAL CARE IF:  Treatment seems to offer no benefit, or the condition worsens.  Any medications produce adverse side effects. EXERCISES  RANGE OF MOTION (ROM) AND STRETCHING EXERCISES - Sciatica Most people with sciatic will find that their symptoms worsen with either excessive bending forward (flexion) or arching at the low back (extension). The exercises which will help resolve your symptoms will focus on the opposite motion. Your physician, physical therapist or athletic trainer will help you determine which exercises will be most helpful to resolve your low back pain. Do not complete any exercises without first consulting with your clinician. Discontinue any exercises which worsen your symptoms until you speak to your clinician. If you have pain, numbness or tingling which travels down into your buttocks, leg or foot, the goal of the therapy is for these symptoms to move closer to your back and eventually resolve. Occasionally, these leg symptoms will get better, but your low back pain may worsen; this is typically an indication of progress in your rehabilitation. Be certain to be very alert to any changes in your symptoms and the activities in which you participated in the 24 hours prior to the change. Sharing this information with your clinician will allow him/her to most efficiently treat your condition. These exercises may help you when beginning to rehabilitate your injury. Your symptoms may resolve with or without further involvement from your physician, physical therapist or athletic trainer. While completing these exercises, remember:   Restoring tissue flexibility helps normal motion to return to the joints. This allows healthier, less painful movement and activity.  An effective stretch should be held for at least 30 seconds.  A stretch should never be painful. You should  only feel a gentle lengthening or release in the stretched tissue. FLEXION RANGE OF MOTION AND STRETCHING EXERCISES: STRETCH - Flexion, Single Knee to Chest   Lie on a firm bed or floor with both legs extended in front of you.  Keeping one leg in contact with the floor, bring your opposite knee to your chest. Hold your leg in place by either grabbing behind your thigh or at your knee.  Pull until you feel a gentle stretch in your low back. Hold __________ seconds.  Slowly release your grasp and repeat the exercise with the opposite side. Repeat __________ times. Complete this exercise __________ times per day.  STRETCH - Flexion, Double Knee to Chest  Lie on a firm bed or floor with both legs extended in front of you.  Keeping one leg in contact with the floor, bring your opposite knee to your chest.  Tense your stomach muscles to support your back and then lift your other knee to your chest. Hold your legs in place by either grabbing behind your thighs or at your knees.  Pull both knees toward your chest until you feel a gentle stretch in your low back. Hold __________ seconds.  Tense your stomach muscles and slowly return one leg at a time to the floor. Repeat __________ times. Complete this exercise __________ times per day.  STRETCH - Low Trunk Rotation   Lie on a firm bed or floor. Keeping your legs in front of you, bend your knees so they are both pointed toward the ceiling and your feet are flat on the floor.  Extend your arms out to  the side. This will stabilize your upper body by keeping your shoulders in contact with the floor.  Gently and slowly drop both knees together to one side until you feel a gentle stretch in your low back. Hold for __________ seconds.  Tense your stomach muscles to support your low back as you bring your knees back to the starting position. Repeat the exercise to the other side. Repeat __________ times. Complete this exercise __________ times per  day  EXTENSION RANGE OF MOTION AND FLEXIBILITY EXERCISES: STRETCH - Extension, Prone on Elbows  Lie on your stomach on the floor, a bed will be too soft. Place your palms about shoulder width apart and at the height of your head.  Place your elbows under your shoulders. If this is too painful, stack pillows under your chest.  Allow your body to relax so that your hips drop lower and make contact more completely with the floor.  Hold this position for __________ seconds.  Slowly return to lying flat on the floor. Repeat __________ times. Complete this exercise __________ times per day.  RANGE OF MOTION - Extension, Prone Press Ups  Lie on your stomach on the floor, a bed will be too soft. Place your palms about shoulder width apart and at the height of your head.  Keeping your back as relaxed as possible, slowly straighten your elbows while keeping your hips on the floor. You may adjust the placement of your hands to maximize your comfort. As you gain motion, your hands will come more underneath your shoulders.  Hold this position __________ seconds.  Slowly return to lying flat on the floor. Repeat __________ times. Complete this exercise __________ times per day.  STRENGTHENING EXERCISES - Sciatica  These exercises may help you when beginning to rehabilitate your injury. These exercises should be done near your "sweet spot." This is the neutral, low-back arch, somewhere between fully rounded and fully arched, that is your least painful position. When performed in this safe range of motion, these exercises can be used for people who have either a flexion or extension based injury. These exercises may resolve your symptoms with or without further involvement from your physician, physical therapist or athletic trainer. While completing these exercises, remember:   Muscles can gain both the endurance and the strength needed for everyday activities through controlled exercises.  Complete  these exercises as instructed by your physician, physical therapist or athletic trainer. Progress with the resistance and repetition exercises only as your caregiver advises.  You may experience muscle soreness or fatigue, but the pain or discomfort you are trying to eliminate should never worsen during these exercises. If this pain does worsen, stop and make certain you are following the directions exactly. If the pain is still present after adjustments, discontinue the exercise until you can discuss the trouble with your clinician. STRENGTHENING - Deep Abdominals, Pelvic Tilt   Lie on a firm bed or floor. Keeping your legs in front of you, bend your knees so they are both pointed toward the ceiling and your feet are flat on the floor.  Tense your lower abdominal muscles to press your low back into the floor. This motion will rotate your pelvis so that your tail bone is scooping upwards rather than pointing at your feet or into the floor.  With a gentle tension and even breathing, hold this position for __________ seconds. Repeat __________ times. Complete this exercise __________ times per day.  STRENGTHENING - Abdominals, Crunches   Lie on a  firm bed or floor. Keeping your legs in front of you, bend your knees so they are both pointed toward the ceiling and your feet are flat on the floor. Cross your arms over your chest.  Slightly tip your chin down without bending your neck.  Tense your abdominals and slowly lift your trunk high enough to just clear your shoulder blades. Lifting higher can put excessive stress on the low back and does not further strengthen your abdominal muscles.  Control your return to the starting position. Repeat __________ times. Complete this exercise __________ times per day.  STRENGTHENING - Quadruped, Opposite UE/LE Lift  Assume a hands and knees position on a firm surface. Keep your hands under your shoulders and your knees under your hips. You may place padding  under your knees for comfort.  Find your neutral spine and gently tense your abdominal muscles so that you can maintain this position. Your shoulders and hips should form a rectangle that is parallel with the floor and is not twisted.  Keeping your trunk steady, lift your right hand no higher than your shoulder and then your left leg no higher than your hip. Make sure you are not holding your breath. Hold this position __________ seconds.  Continuing to keep your abdominal muscles tense and your back steady, slowly return to your starting position. Repeat with the opposite arm and leg. Repeat __________ times. Complete this exercise __________ times per day.  STRENGTHENING - Abdominals and Quadriceps, Straight Leg Raise   Lie on a firm bed or floor with both legs extended in front of you.  Keeping one leg in contact with the floor, bend the other knee so that your foot can rest flat on the floor.  Find your neutral spine, and tense your abdominal muscles to maintain your spinal position throughout the exercise.  Slowly lift your straight leg off the floor about 6 inches for a count of 15, making sure to not hold your breath.  Still keeping your neutral spine, slowly lower your leg all the way to the floor. Repeat this exercise with each leg __________ times. Complete this exercise __________ times per day. POSTURE AND BODY MECHANICS CONSIDERATIONS - Sciatica Keeping correct posture when sitting, standing or completing your activities will reduce the stress put on different body tissues, allowing injured tissues a chance to heal and limiting painful experiences. The following are general guidelines for improved posture. Your physician or physical therapist will provide you with any instructions specific to your needs. While reading these guidelines, remember:  The exercises prescribed by your provider will help you have the flexibility and strength to maintain correct postures.  The correct  posture provides the optimal environment for your joints to work. All of your joints have less wear and tear when properly supported by a spine with good posture. This means you will experience a healthier, less painful body.  Correct posture must be practiced with all of your activities, especially prolonged sitting and standing. Correct posture is as important when doing repetitive low-stress activities (typing) as it is when doing a single heavy-load activity (lifting). RESTING POSITIONS Consider which positions are most painful for you when choosing a resting position. If you have pain with flexion-based activities (sitting, bending, stooping, squatting), choose a position that allows you to rest in a less flexed posture. You would want to avoid curling into a fetal position on your side. If your pain worsens with extension-based activities (prolonged standing, working overhead), avoid resting in an extended  position such as sleeping on your stomach. Most people will find more comfort when they rest with their spine in a more neutral position, neither too rounded nor too arched. Lying on a non-sagging bed on your side with a pillow between your knees, or on your back with a pillow under your knees will often provide some relief. Keep in mind, being in any one position for a prolonged period of time, no matter how correct your posture, can still lead to stiffness. PROPER SITTING POSTURE In order to minimize stress and discomfort on your spine, you must sit with correct posture Sitting with good posture should be effortless for a healthy body. Returning to good posture is a gradual process. Many people can work toward this most comfortably by using various supports until they have the flexibility and strength to maintain this posture on their own. When sitting with proper posture, your ears will fall over your shoulders and your shoulders will fall over your hips. You should use the back of the chair to  support your upper back. Your low back will be in a neutral position, just slightly arched. You may place a small pillow or folded towel at the base of your low back for support.  When working at a desk, create an environment that supports good, upright posture. Without extra support, muscles fatigue and lead to excessive strain on joints and other tissues. Keep these recommendations in mind: CHAIR:   A chair should be able to slide under your desk when your back makes contact with the back of the chair. This allows you to work closely.  The chair's height should allow your eyes to be level with the upper part of your monitor and your hands to be slightly lower than your elbows. BODY POSITION  Your feet should make contact with the floor. If this is not possible, use a foot rest.  Keep your ears over your shoulders. This will reduce stress on your neck and low back. INCORRECT SITTING POSTURES   If you are feeling tired and unable to assume a healthy sitting posture, do not slouch or slump. This puts excessive strain on your back tissues, causing more damage and pain. Healthier options include:  Using more support, like a lumbar pillow.  Switching tasks to something that requires you to be upright or walking.  Talking a brief walk.  Lying down to rest in a neutral-spine position. PROLONGED STANDING WHILE SLIGHTLY LEANING FORWARD  When completing a task that requires you to lean forward while standing in one place for a long time, place either foot up on a stationary 2-4 inch high object to help maintain the best posture. When both feet are on the ground, the low back tends to lose its slight inward curve. If this curve flattens (or becomes too large), then the back and your other joints will experience too much stress, fatigue more quickly and can cause pain.  CORRECT STANDING POSTURES Proper standing posture should be assumed with all daily activities, even if they only take a few moments,  like when brushing your teeth. As in sitting, your ears should fall over your shoulders and your shoulders should fall over your hips. You should keep a slight tension in your abdominal muscles to brace your spine. Your tailbone should point down to the ground, not behind your body, resulting in an over-extended swayback posture.  INCORRECT STANDING POSTURES  Common incorrect standing postures include a forward head, locked knees and/or an excessive swayback. WALKING  Walk with an upright posture. Your ears, shoulders and hips should all line-up. PROLONGED ACTIVITY IN A FLEXED POSITION When completing a task that requires you to bend forward at your waist or lean over a low surface, try to find a way to stabilize 3 of 4 of your limbs. You can place a hand or elbow on your thigh or rest a knee on the surface you are reaching across. This will provide you more stability so that your muscles do not fatigue as quickly. By keeping your knees relaxed, or slightly bent, you will also reduce stress across your low back. CORRECT LIFTING TECHNIQUES DO :   Assume a wide stance. This will provide you more stability and the opportunity to get as close as possible to the object which you are lifting.  Tense your abdominals to brace your spine; then bend at the knees and hips. Keeping your back locked in a neutral-spine position, lift using your leg muscles. Lift with your legs, keeping your back straight.  Test the weight of unknown objects before attempting to lift them.  Try to keep your elbows locked down at your sides in order get the best strength from your shoulders when carrying an object.  Always ask for help when lifting heavy or awkward objects. INCORRECT LIFTING TECHNIQUES DO NOT:   Lock your knees when lifting, even if it is a small object.  Bend and twist. Pivot at your feet or move your feet when needing to change directions.  Assume that you cannot safely pick up a paperclip without proper  posture. Document Released: 10/15/2005 Document Revised: 03/01/2014 Document Reviewed: 01/27/2009 [facility] Patient Information 2015 [place], [place]. This information is not intended to replace advice given to you by your health care provider. Make sure you discuss any questions you have with your health care provider.

## 2015-06-08 NOTE — Progress Notes (Signed)
Pre visit review using our clinic review tool, if applicable. No additional management support is needed unless otherwise documented below in the visit note. 

## 2015-06-09 NOTE — Assessment & Plan Note (Signed)
Rx Medrol dose pack. Continue pain medication as directed. Limit overexertion and avoid heavy lifting. Sciatica stretches reviewed and handout given. Will need imaging if not resolving.

## 2015-06-09 NOTE — Progress Notes (Signed)
Patient presents to clinic today c/o pain in lower back radiating into L groin and down L leg starting last Tuesday. Denies trauma or injury. Denies numbness of extremity but notes some tingling. Denies saddle anesthesia or change to bowel/bladder habits.  Past Medical History  Diagnosis Date  . Emphysema lung   . Cardiac arrhythmia due to congenital heart disease   . Heart murmur   . Hypertension   . History of blood transfusion 1971    with C-section complication  . UTI (lower urinary tract infection)     Current Outpatient Prescriptions on File Prior to Visit  Medication Sig Dispense Refill  . amLODipine (NORVASC) 5 MG tablet Take 1 tablet (5 mg total) by mouth daily. 90 tablet 1  . aspirin EC 81 MG tablet Take 81 mg by mouth daily.    . benazepril (LOTENSIN) 20 MG tablet Take 20 mg by mouth daily.    . Calcium-Vitamin D-Vitamin K (VIACTIV PO) Take by mouth.    Marland Kitchen HYDROcodone-acetaminophen (NORCO) 5-325 MG per tablet Take 1-2 tablets by mouth every 6 (six) hours as needed for severe pain. 20 tablet 0  . Multiple Vitamin (MULTIVITAMIN) tablet Take 1 tablet by mouth daily.    . pravastatin (PRAVACHOL) 20 MG tablet Take 1 tablet (20 mg total) by mouth daily. 90 tablet 1  . Vitamin D, Ergocalciferol, (DRISDOL) 50000 UNITS CAPS capsule Take 1 capsule (50,000 Units total) by mouth every 7 (seven) days. 12 capsule 0   No current facility-administered medications on file prior to visit.    No Known Allergies  Family History  Problem Relation Age of Onset  . Cancer Mother     breast  . Cancer Brother     lung  . Diabetes Son     type 1    Social History   Social History  . Marital Status: Widowed    Spouse Name: N/A  . Number of Children: N/A  . Years of Education: N/A   Social History Main Topics  . Smoking status: Former Smoker -- 1.00 packs/day for 40 years    Types: Cigarettes    Quit date: 03/19/2004  . Smokeless tobacco: Never Used  . Alcohol Use: 0.0 oz/week      0 Standard drinks or equivalent per week  . Drug Use: No  . Sexual Activity: Not Asked   Other Topics Concern  . None   Social History Narrative    Review of Systems - See HPI.  All other ROS are negative.  BP 138/60 mmHg  Pulse 84  Temp(Src) 97.8 F (36.6 C) (Oral)  Ht 5\' 6"  (1.676 m)  Wt 156 lb 9.6 oz (71.033 kg)  BMI 25.29 kg/m2  SpO2 98%  Physical Exam  Constitutional: She is oriented to person, place, and time and well-developed, well-nourished, and in no distress.  Cardiovascular: Normal rate, regular rhythm, normal heart sounds and intact distal pulses.   Pulmonary/Chest: Effort normal and breath sounds normal. No respiratory distress. She has no wheezes. She has no rales. She exhibits no tenderness.  Musculoskeletal:       Lumbar back: She exhibits pain. She exhibits normal range of motion, no tenderness and no bony tenderness.  Neurological: She is alert and oriented to person, place, and time.  Skin: Skin is warm and dry. No rash noted.  Psychiatric: Affect normal.  Vitals reviewed.   Recent Results (from the past 2160 hour(s))  Lipid panel     Status: Abnormal   Collection  Time: 03/30/15 11:19 AM  Result Value Ref Range   Cholesterol 186 0 - 200 mg/dL    Comment: ATP III Classification       Desirable:  < 200 mg/dL               Borderline High:  200 - 239 mg/dL          High:  > = 240 mg/dL   Triglycerides 82.0 0.0 - 149.0 mg/dL    Comment: Normal:  <150 mg/dLBorderline High:  150 - 199 mg/dL   HDL 69.00 >39.00 mg/dL   VLDL 16.4 0.0 - 40.0 mg/dL   LDL Cholesterol 101 (H) 0 - 99 mg/dL   Total CHOL/HDL Ratio 3     Comment:                Men          Women1/2 Average Risk     3.4          3.3Average Risk          5.0          4.42X Average Risk          9.6          7.13X Average Risk          15.0          11.0                       NonHDL 117.00     Comment: NOTE:  Non-HDL goal should be 30 mg/dL higher than patient's LDL goal (i.e. LDL goal of < 70  mg/dL, would have non-HDL goal of < 100 mg/dL)  Basic metabolic panel     Status: None   Collection Time: 03/30/15 11:19 AM  Result Value Ref Range   Sodium 137 135 - 145 mEq/L   Potassium 4.3 3.5 - 5.1 mEq/L   Chloride 104 96 - 112 mEq/L   CO2 28 19 - 32 mEq/L   Glucose, Bld 76 70 - 99 mg/dL   BUN 18 6 - 23 mg/dL   Creatinine, Ser 0.80 0.40 - 1.20 mg/dL   Calcium 9.3 8.4 - 10.5 mg/dL   GFR 74.34 >60.00 mL/min  Hepatic function panel     Status: None   Collection Time: 03/30/15 11:19 AM  Result Value Ref Range   Total Bilirubin 0.4 0.2 - 1.2 mg/dL   Bilirubin, Direct 0.1 0.0 - 0.3 mg/dL   Alkaline Phosphatase 60 39 - 117 U/L   AST 23 0 - 37 U/L   ALT 18 0 - 35 U/L   Total Protein 7.4 6.0 - 8.3 g/dL   Albumin 3.9 3.5 - 5.2 g/dL  CBC with Differential/Platelet     Status: Abnormal   Collection Time: 03/30/15 11:19 AM  Result Value Ref Range   WBC 8.5 4.0 - 10.5 K/uL   RBC 4.56 3.87 - 5.11 Mil/uL   Hemoglobin 13.9 12.0 - 15.0 g/dL   HCT 42.0 36.0 - 46.0 %   MCV 92.0 78.0 - 100.0 fl   MCHC 33.2 30.0 - 36.0 g/dL   RDW 14.1 11.5 - 15.5 %   Platelets 372.0 150.0 - 400.0 K/uL   Neutrophils Relative % 57.0 43.0 - 77.0 %   Lymphocytes Relative 26.6 12.0 - 46.0 %   Monocytes Relative 8.1 3.0 - 12.0 %   Eosinophils Relative 7.5 (H) 0.0 - 5.0 %   Basophils Relative 0.8 0.0 -  3.0 %   Neutro Abs 4.8 1.4 - 7.7 K/uL   Lymphs Abs 2.3 0.7 - 4.0 K/uL   Monocytes Absolute 0.7 0.1 - 1.0 K/uL   Eosinophils Absolute 0.6 0.0 - 0.7 K/uL   Basophils Absolute 0.1 0.0 - 0.1 K/uL  HM MAMMOGRAPHY     Status: None   Collection Time: 04/05/15 12:00 AM  Result Value Ref Range   HM Mammogram Done-normal     Assessment/Plan: Sciatica Rx Medrol dose pack. Continue pain medication as directed. Limit overexertion and avoid heavy lifting. Sciatica stretches reviewed and handout given. Will need imaging if not resolving.

## 2015-06-16 ENCOUNTER — Telehealth: Payer: Self-pay | Admitting: Family Medicine

## 2015-06-16 NOTE — Telephone Encounter (Signed)
Pt left VM 06/16/15 11:30am with name, DOB, and phone#. No details. Called and left msg for pt to call back.

## 2015-07-07 ENCOUNTER — Other Ambulatory Visit: Payer: Medicare Other

## 2015-07-08 ENCOUNTER — Ambulatory Visit (INDEPENDENT_AMBULATORY_CARE_PROVIDER_SITE_OTHER): Payer: Medicare Other | Admitting: Internal Medicine

## 2015-07-08 ENCOUNTER — Encounter: Payer: Self-pay | Admitting: Internal Medicine

## 2015-07-08 VITALS — BP 126/74 | HR 88 | Temp 97.6°F | Ht 66.0 in | Wt 158.5 lb

## 2015-07-08 DIAGNOSIS — M25552 Pain in left hip: Secondary | ICD-10-CM

## 2015-07-08 DIAGNOSIS — R1030 Lower abdominal pain, unspecified: Secondary | ICD-10-CM

## 2015-07-08 DIAGNOSIS — R1032 Left lower quadrant pain: Secondary | ICD-10-CM

## 2015-07-08 DIAGNOSIS — R1031 Right lower quadrant pain: Secondary | ICD-10-CM

## 2015-07-08 LAB — POCT URINALYSIS DIPSTICK
BILIRUBIN UA: NEGATIVE
Blood, UA: NEGATIVE
GLUCOSE UA: NEGATIVE
KETONES UA: NEGATIVE
LEUKOCYTES UA: NEGATIVE
Nitrite, UA: NEGATIVE
PH UA: 5.5
Protein, UA: NEGATIVE
Spec Grav, UA: >=1.03
Urobilinogen, UA: 0.2

## 2015-07-08 LAB — CBC WITH DIFFERENTIAL/PLATELET
BASOS ABS: 0.1 10*3/uL (ref 0.0–0.1)
BASOS PCT: 0.6 % (ref 0.0–3.0)
Eosinophils Absolute: 0.5 10*3/uL (ref 0.0–0.7)
Eosinophils Relative: 6.3 % — ABNORMAL HIGH (ref 0.0–5.0)
HEMATOCRIT: 39.1 % (ref 36.0–46.0)
Hemoglobin: 12.9 g/dL (ref 12.0–15.0)
LYMPHS ABS: 1.9 10*3/uL (ref 0.7–4.0)
Lymphocytes Relative: 22.9 % (ref 12.0–46.0)
MCHC: 33 g/dL (ref 30.0–36.0)
MCV: 92.2 fl (ref 78.0–100.0)
MONOS PCT: 8.2 % (ref 3.0–12.0)
Monocytes Absolute: 0.7 10*3/uL (ref 0.1–1.0)
NEUTROS ABS: 5.2 10*3/uL (ref 1.4–7.7)
NEUTROS PCT: 62 % (ref 43.0–77.0)
PLATELETS: 364 10*3/uL (ref 150.0–400.0)
RBC: 4.24 Mil/uL (ref 3.87–5.11)
RDW: 14.6 % (ref 11.5–15.5)
WBC: 8.4 10*3/uL (ref 4.0–10.5)

## 2015-07-08 LAB — SEDIMENTATION RATE: Sed Rate: 44 mm/hr — ABNORMAL HIGH (ref 0–22)

## 2015-07-08 MED ORDER — PREDNISONE 10 MG PO TABS: ORAL_TABLET | ORAL | Status: DC

## 2015-07-08 MED ORDER — HYDROCODONE-ACETAMINOPHEN 5-325 MG PO TABS: 1.0000 | ORAL_TABLET | Freq: Four times a day (QID) | ORAL | Status: DC | PRN

## 2015-07-08 NOTE — Patient Instructions (Signed)
Get your blood work before you leave   Take prednisone as prescribed  Take hydrocodone as needed for pain, watch for drowsiness  Please get a release of information document from the front desk, send it to your surgeons in [place], we need to all your previous surgeries reports

## 2015-07-08 NOTE — Progress Notes (Signed)
Pre visit review using our clinic review tool, if applicable. No additional management support is needed unless otherwise documented below in the visit note. 

## 2015-07-08 NOTE — Progress Notes (Signed)
Subjective:    Patient ID: Dominique [name], female    DOB: 01/27/1941, 74 y.o.   MRN: [id number]  DOS:  07/08/2015 Type of visit - description :  Interval history:  ER note from 05/15/2015 reviewed>>> seen c/o left buttock pain for 6 days, rad to leg-foot . X-ray of left hip unremarkable except for evidence of previous surgery . Diagnosed with left sciatica.   Seen here 06/08/2015, note reviewed,  with left back, L groin and left leg pain. Also reported R leg-groin pain Was prescribed prednisone which helped significantly, in fact she was pain-free for a while.  Pain has been gradually returning, this time only on the left side, it was severe yesterday and located at the left anterior hip, L groin area; minimal if any pain distally. Pain increases with walking and putting pressure on the hip.     Review of Systems Denies fever chills. No weight loss, no headaches. No pain around the shoulder area. No bladder or bowel incontinence No hematuria, some urinary frequency. No paresthesias other than the occasional numbness on the dorsum of the left foot.  Past Medical History  Diagnosis Date  . Emphysema lung   . Cardiac arrhythmia due to congenital heart disease   . Heart murmur   . Hypertension   . History of blood transfusion 1971    with C-section complication  . UTI (lower urinary tract infection)     Past Surgical History  Procedure Laterality Date  . Appendectomy  1951  . Cesarean section      1971  . Tubal ligation  1975  . Laminectomy    . Total hip arthroplasty Bilateral   . Cataract extraction    . Joint replacement      Social History   Social History  . Marital Status: Widowed    Spouse Name: N/A  . Number of Children: N/A  . Years of Education: N/A   Occupational History  . Not on file.   Social History Main Topics  . Smoking status: Former Smoker -- 1.00 packs/day for 40 years    Types: Cigarettes    Quit date: 03/19/2004  . Smokeless tobacco:  Never Used  . Alcohol Use: 0.0 oz/week    0 Standard drinks or equivalent per week  . Drug Use: No  . Sexual Activity: Not on file   Other Topics Concern  . Not on file   Social History Narrative        Medication List       This list is accurate as of: 07/08/15 11:59 PM.  Always use your most recent med list.               amLODipine 5 MG tablet  Commonly known as:  NORVASC  Take 1 tablet (5 mg total) by mouth daily.     aspirin EC 81 MG tablet  Take 81 mg by mouth daily.     benazepril 20 MG tablet  Commonly known as:  LOTENSIN  Take 20 mg by mouth daily.     HYDROcodone-acetaminophen 5-325 MG per tablet  Commonly known as:  NORCO  Take 1-2 tablets by mouth every 6 (six) hours as needed for severe pain.     multivitamin tablet  Take 1 tablet by mouth daily.     pravastatin 20 MG tablet  Commonly known as:  PRAVACHOL  Take 1 tablet (20 mg total) by mouth daily.     predniSONE 10 MG tablet  Commonly known as:  DELTASONE  4 tablets x 2 days, 3 tabs x 2 days, 2 tabs x 2 days, 1 tab x 2 days     VIACTIV PO  Take by mouth.     Vitamin D3 5000 UNITS Caps  Take 1 capsule by mouth daily.           Objective:   Physical Exam BP 126/74 mmHg  Pulse 88  Temp(Src) 97.6 F (36.4 C) (Oral)  Ht 5\' 6"  (1.676 m)  Wt 158 lb 8 oz (71.895 kg)  BMI 25.59 kg/m2  SpO2 97% General:   Well developed, well nourished . NAD.  HEENT:  Normocephalic . Face symmetric, atraumatic Abdomen: No evidence of inguinal hernia on either side Lower extremities: Good femoral and pedal pulses, no edema. MSK: Slightly TTP at the lower back bilaterally. Left hip normal to rotation Left hip: Passive rotation is slightly decreased and elicits pain. Also TTP at the anterior aspects of the hip without mass. Get limited by left hip pain, some limping Skin: Not pale. Not jaundice Neurologic:  alert & oriented X3.  Speech normal, gait appropriate for age and unassisted. DTRs  symmetric Psych--  Cognition and judgment appear intact.  Cooperative with normal attention span and concentration.  Behavior appropriate. No anxious or depressed appearing.      Assessment & Plan:   Left hip, left leg pain. 74 year old lady with history of bilateral hip replacement in 2011 and back surgery in 2006 when she was living in [place] She got a MRI of the back in 2014 (will be scanned): DJD and changes of post laminectomy L4-5 and L5-S1. Moderate bilateral foraminal stenosis at L4-5 and moderate foraminal stenosis L3-4 and L5-S1 bilaterally  Small central disc protrusion L5-S1 and T11 on T12.  C/o 2 months history of pain at the back, both hips, lower extremities; at this time however the pain is most noticeable at the left hip. Most likely pain related to the hip, other considerations are polymyalgia rheumatica, spinal stenosis , sciatic pain, side effects from Pravachol but that is much less likely. Udip normal Plan: Labs Prednisone, refill hydrocodone, refer to orthopedic.

## 2015-07-11 ENCOUNTER — Telehealth: Payer: Self-pay | Admitting: Family Medicine

## 2015-07-11 DIAGNOSIS — M25552 Pain in left hip: Secondary | ICD-10-CM | POA: Diagnosis not present

## 2015-07-11 DIAGNOSIS — M545 Low back pain: Secondary | ICD-10-CM | POA: Diagnosis not present

## 2015-07-11 NOTE — Telephone Encounter (Signed)
Pt would like call with lab results & has questions about what to do for pain.

## 2015-07-11 NOTE — Telephone Encounter (Signed)
Spoke with Pt, she informed me that hip pain is getting worse, she has been using Hydrocodone which is not really helping. I informed her that her lab results were normal, and that we have sent over her information to [facility]. Informed Pt if she is in that much pain, I recommended she go to [facility] which is open from 5:30 to 9:00 PM. Informed her if she can't wait until then to go to ED. Pt verbalized understanding and agreed.

## 2015-07-16 DIAGNOSIS — M545 Low back pain: Secondary | ICD-10-CM | POA: Diagnosis not present

## 2015-07-19 DIAGNOSIS — M25551 Pain in right hip: Secondary | ICD-10-CM | POA: Diagnosis not present

## 2015-07-19 DIAGNOSIS — M545 Low back pain: Secondary | ICD-10-CM | POA: Diagnosis not present

## 2015-07-22 DIAGNOSIS — M8448XA Pathological fracture, other site, initial encounter for fracture: Secondary | ICD-10-CM | POA: Diagnosis not present

## 2015-07-22 DIAGNOSIS — S3210XA Unspecified fracture of sacrum, initial encounter for closed fracture: Secondary | ICD-10-CM | POA: Diagnosis not present

## 2015-07-27 ENCOUNTER — Other Ambulatory Visit (HOSPITAL_COMMUNITY): Payer: Self-pay | Admitting: Orthopedic Surgery

## 2015-08-03 ENCOUNTER — Other Ambulatory Visit (HOSPITAL_COMMUNITY): Payer: Self-pay | Admitting: Interventional Radiology

## 2015-08-03 DIAGNOSIS — M549 Dorsalgia, unspecified: Secondary | ICD-10-CM

## 2015-08-08 ENCOUNTER — Ambulatory Visit (HOSPITAL_COMMUNITY)
Admission: RE | Admit: 2015-08-08 | Discharge: 2015-08-08 | Disposition: A | Discharge status: Home / Self Care | Payer: Medicare Other | Source: Ambulatory Visit | Attending: Interventional Radiology | Admitting: Interventional Radiology

## 2015-08-08 DIAGNOSIS — M549 Dorsalgia, unspecified: Secondary | ICD-10-CM

## 2015-08-08 DIAGNOSIS — M545 Low back pain: Secondary | ICD-10-CM | POA: Diagnosis not present

## 2015-08-09 ENCOUNTER — Other Ambulatory Visit (HOSPITAL_COMMUNITY): Payer: Self-pay | Admitting: Interventional Radiology

## 2015-08-09 DIAGNOSIS — S3210XA Unspecified fracture of sacrum, initial encounter for closed fracture: Secondary | ICD-10-CM

## 2015-08-09 DIAGNOSIS — M549 Dorsalgia, unspecified: Secondary | ICD-10-CM

## 2015-08-10 ENCOUNTER — Other Ambulatory Visit: Payer: Self-pay | Admitting: Radiology

## 2015-08-11 ENCOUNTER — Encounter (HOSPITAL_COMMUNITY): Payer: Self-pay

## 2015-08-11 ENCOUNTER — Ambulatory Visit (HOSPITAL_COMMUNITY)
Admission: RE | Admit: 2015-08-11 | Discharge: 2015-08-11 | Disposition: A | Discharge status: Home / Self Care | Payer: Medicare Other | Source: Ambulatory Visit | Attending: Interventional Radiology | Admitting: Interventional Radiology

## 2015-08-11 DIAGNOSIS — S3210XA Unspecified fracture of sacrum, initial encounter for closed fracture: Secondary | ICD-10-CM | POA: Diagnosis not present

## 2015-08-11 DIAGNOSIS — M8448XA Pathological fracture, other site, initial encounter for fracture: Secondary | ICD-10-CM | POA: Diagnosis not present

## 2015-08-11 DIAGNOSIS — M549 Dorsalgia, unspecified: Secondary | ICD-10-CM

## 2015-08-11 DIAGNOSIS — R1030 Lower abdominal pain, unspecified: Secondary | ICD-10-CM | POA: Insufficient documentation

## 2015-08-11 DIAGNOSIS — Z7982 Long term (current) use of aspirin: Secondary | ICD-10-CM | POA: Insufficient documentation

## 2015-08-11 DIAGNOSIS — M545 Low back pain: Secondary | ICD-10-CM | POA: Insufficient documentation

## 2015-08-11 DIAGNOSIS — Z79899 Other long term (current) drug therapy: Secondary | ICD-10-CM | POA: Insufficient documentation

## 2015-08-11 LAB — CBC WITH DIFFERENTIAL/PLATELET
Basophils Absolute: 0.1 10*3/uL (ref 0.0–0.1)
Basophils Relative: 1 %
EOS PCT: 12 %
Eosinophils Absolute: 0.9 10*3/uL — ABNORMAL HIGH (ref 0.0–0.7)
HEMATOCRIT: 39.6 % (ref 36.0–46.0)
Hemoglobin: 12.8 g/dL (ref 12.0–15.0)
LYMPHS ABS: 1.9 10*3/uL (ref 0.7–4.0)
LYMPHS PCT: 24 %
MCH: 30 pg (ref 26.0–34.0)
MCHC: 32.3 g/dL (ref 30.0–36.0)
MCV: 93 fL (ref 78.0–100.0)
MONO ABS: 1.1 10*3/uL — AB (ref 0.1–1.0)
Monocytes Relative: 14 %
NEUTROS ABS: 3.9 10*3/uL (ref 1.7–7.7)
Neutrophils Relative %: 49 %
PLATELETS: 462 10*3/uL — AB (ref 150–400)
RBC: 4.26 MIL/uL (ref 3.87–5.11)
RDW: 13.6 % (ref 11.5–15.5)
WBC: 7.8 10*3/uL (ref 4.0–10.5)

## 2015-08-11 LAB — PROTIME-INR
INR: 1.03 (ref 0.00–1.49)
Prothrombin Time: 13.7 seconds (ref 11.6–15.2)

## 2015-08-11 MED ORDER — CEFAZOLIN SODIUM-DEXTROSE 2-3 GM-% IV SOLR
2.0000 g | Freq: Once | INTRAVENOUS | Status: AC
  Administered 2015-08-11: 2 g via INTRAVENOUS

## 2015-08-11 MED ORDER — IOHEXOL 300 MG/ML  SOLN
50.0000 mL | Freq: Once | INTRAMUSCULAR | Status: DC | PRN
  Administered 2015-08-11: 10 mL via INTRAVENOUS
  Filled 2015-08-11: qty 50

## 2015-08-11 MED ORDER — SODIUM CHLORIDE 0.9 % IV SOLN
INTRAVENOUS | Status: DC
  Administered 2015-08-11: 08:00:00 via INTRAVENOUS

## 2015-08-11 MED ORDER — FENTANYL CITRATE (PF) 100 MCG/2ML IJ SOLN
INTRAMUSCULAR | Status: AC
  Filled 2015-08-11: qty 4

## 2015-08-11 MED ORDER — FENTANYL CITRATE (PF) 100 MCG/2ML IJ SOLN
INTRAMUSCULAR | Status: AC | PRN
  Administered 2015-08-11 (×2): 25 ug via INTRAVENOUS

## 2015-08-11 MED ORDER — HYDROMORPHONE HCL 1 MG/ML IJ SOLN
INTRAMUSCULAR | Status: AC
  Filled 2015-08-11: qty 1

## 2015-08-11 MED ORDER — GELATIN ABSORBABLE 12-7 MM EX MISC
CUTANEOUS | Status: AC
  Filled 2015-08-11: qty 1

## 2015-08-11 MED ORDER — MIDAZOLAM HCL 2 MG/2ML IJ SOLN
INTRAMUSCULAR | Status: AC
  Filled 2015-08-11: qty 2

## 2015-08-11 MED ORDER — MIDAZOLAM HCL 2 MG/2ML IJ SOLN
INTRAMUSCULAR | Status: AC | PRN
  Administered 2015-08-11: 1 mg via INTRAVENOUS

## 2015-08-11 MED ORDER — SODIUM CHLORIDE 0.9 % IV SOLN: INTRAVENOUS | Status: AC

## 2015-08-11 MED ORDER — CEFAZOLIN SODIUM-DEXTROSE 2-3 GM-% IV SOLR
INTRAVENOUS | Status: AC
  Administered 2015-08-11: 2 g via INTRAVENOUS
  Filled 2015-08-11: qty 50

## 2015-08-11 MED ORDER — TOBRAMYCIN SULFATE 1.2 G IJ SOLR
INTRAMUSCULAR | Status: AC
  Filled 2015-08-11: qty 1.2

## 2015-08-11 MED ORDER — BUPIVACAINE HCL (PF) 0.25 % IJ SOLN
INTRAMUSCULAR | Status: AC
  Filled 2015-08-11: qty 30

## 2015-08-11 NOTE — Procedures (Signed)
S/p bilateral S1 vertebroplasty(sacrplasty)

## 2015-08-11 NOTE — Sedation Documentation (Signed)
Patient is resting comfortably. 

## 2015-08-11 NOTE — H&P (Signed)
HPI: Patient with low back/left groin pain uncontrolled with pain medication, she was found to have sacral insufficiency fractures. She has been seen by Dr. Estanislado Pandy in full consult on 08/08/15 and scheduled today for image guided sacroplasty.  The patient has had a H&P performed within the last 30 days, all history, medications, and exam have been reviewed. The patient denies any interval changes since the H&P.  Medications: Prior to Admission medications   Medication Sig Start Date End Date Taking? Authorizing Provider  acetaminophen (TYLENOL) 500 MG tablet Take 500 mg by mouth 2 (two) times daily as needed (pain).   Yes Historical Provider, MD  amLODipine (NORVASC) 5 MG tablet Take 1 tablet (5 mg total) by mouth daily. 03/30/15  Yes Midge Minium, MD  aspirin EC 81 MG tablet Take 81 mg by mouth daily.   Yes Historical Provider, MD  benazepril (LOTENSIN) 20 MG tablet Take 20 mg by mouth daily.   Yes Historical Provider, MD  Calcium Carb-Cholecalciferol (CALCIUM 600 + D PO) Take 600 mg by mouth daily.   Yes Historical Provider, MD  Cholecalciferol (VITAMIN D3) 5000 UNITS CAPS Take 5,000 Units by mouth daily.    Yes Historical Provider, MD  HYDROcodone-acetaminophen (NORCO) 5-325 MG per tablet Take 1-2 tablets by mouth every 6 (six) hours as needed for severe pain. 07/08/15  Yes Colon Branch, MD  Multiple Vitamin (MULTIVITAMIN WITH MINERALS) TABS tablet Take 1 tablet by mouth daily.   Yes Historical Provider, MD  oxyCODONE-acetaminophen (PERCOCET/ROXICET) 5-325 MG tablet Take 1 tablet by mouth every 4 (four) hours as needed for severe pain.   Yes Historical Provider, MD  pravastatin (PRAVACHOL) 20 MG tablet Take 1 tablet (20 mg total) by mouth daily. 03/30/15  Yes Midge Minium, MD    Vital Signs: BP 117/58 mmHg  Pulse 87  Temp(Src) 97.7 F (36.5 C)  Resp 18  Ht 5\' 6"  (1.676 m)  Wt 158 lb (71.668 kg)  BMI 25.51 kg/m2  SpO2 96%  Physical Exam  Constitutional: She is oriented to  person, place, and time. No distress.  HENT:  Head: Normocephalic and atraumatic.  Cardiovascular: Normal rate and regular rhythm.  Exam reveals no gallop and no friction rub.   Murmur heard. Pulmonary/Chest: Effort normal and breath sounds normal. No respiratory distress. She has no wheezes. She has no rales.  Abdominal: Soft. Bowel sounds are normal. She exhibits no distension. There is no tenderness.  Neurological: She is alert and oriented to person, place, and time.  Skin: Skin is warm and dry. She is not diaphoretic.    Mallampati Score:  MD Evaluation Airway: WNL Heart: WNL Abdomen: WNL Chest/ Lungs: WNL ASA  Classification: 2 Mallampati/Airway Score: Two  Labs:  CBC:  Recent Labs  12/19/14 1941 03/30/15 1119 07/08/15 1413 08/11/15 0738  WBC 9.6 8.5 8.4 7.8  HGB 14.5 13.9 12.9 12.8  HCT 44.2 42.0 39.1 39.6  PLT 367 372.0 364.0 462*    COAGS: No results for input(s): INR, APTT in the last 8760 hours.  BMP:  Recent Labs  09/27/14 0943 12/19/14 1941 12/19/14 2205 12/27/14 1408 03/30/15 1119  NA 138 133* 135  --  137  K 4.5 5.3* 3.8  --  4.3  CL 104 104 108  --  104  CO2 25 25 25   --  28  GLUCOSE 89 94 98  --  76  BUN 16 21 20   --  18  CALCIUM 9.1 8.2* 7.8* 9.7 9.3  CREATININE  0.7 0.94 0.74  --  0.80  GFRNONAA  --  58* 82*  --   --   GFRAA  --  68* >90  --   --     LIVER FUNCTION TESTS:  Recent Labs  09/27/14 0943 03/30/15 1119  BILITOT 0.5 0.4  AST 26 23  ALT 22 18  ALKPHOS 59 60  PROT 7.5 7.4  ALBUMIN 4.1 3.9    Assessment/Plan:  Low back/left groin pain uncontrolled with pain medication  Sacral insufficiency fractures Seen by Dr. Estanislado Pandy in full consult on 08/08/15 Scheduled today for image guided sacroplasty with sedation The patient has been NPO, no blood thinners taken, labs and vitals have been reviewed. Risks and Benefits discussed with the patient including, but not limited to education regarding the natural healing  process of compression fractures without intervention, bleeding, infection, or cement migration. All of the patient's questions were answered, patient is agreeable to proceed. Consent signed and in chart.   SignedHedy Jacob 08/11/2015, 8:10 AM

## 2015-08-11 NOTE — Discharge Instructions (Signed)
KYPHOPLASTY/VERTEBROPLASTY DISCHARGE INSTRUCTIONS  Medications: (check all that apply)     Resume all home medications as before procedure.       Resume your (aspirin/Plavix/Coumadin) on 08/12/15.                  Continue your pain medications as prescribed as needed.  Over the next 3-5 days, decrease your pain medication as tolerated.  Over the counter medications (i.e. Tylenol, ibuprofen, and aleve) may be substituted once severe/moderate pain symptoms have subsided.   Wound Care: - Bandages may be removed the day following your procedure.  You may get your incision wet once bandages are removed.  Bandaids may be used to cover the incisions until scab formation.  Topical ointments are optional.  - If you develop a fever greater than 101 degrees, have increased skin redness at the incision sites or pus-like oozing from incisions occurring within 1 week of the procedure, contact radiology at [phone] or [phone].  - Ice pack to back for 15-20 minutes 2-3 time per day for first 2-3 days post procedure.  The ice will expedite muscle healing and help with the pain from the incisions.   Activity: - Bedrest today with limited activity for 24 hours post procedure.  - No driving for 48 hours.  - Increase your activity as tolerated after bedrest (with assistance if necessary).  - Refrain from any strenuous activity or heavy lifting (greater than 10 lbs.).   Follow up: - Contact radiology at [phone] or [phone] if any questions/concerns.  - A physician assistant from radiology will contact you in approximately 1 week.  - If a biopsy was performed at the time of your procedure, your referring physician should receive the results in usually 2-3 days.

## 2015-08-16 ENCOUNTER — Other Ambulatory Visit (HOSPITAL_COMMUNITY): Payer: Self-pay | Admitting: Interventional Radiology

## 2015-08-16 DIAGNOSIS — IMO0002 Reserved for concepts with insufficient information to code with codable children: Secondary | ICD-10-CM

## 2015-08-16 DIAGNOSIS — M549 Dorsalgia, unspecified: Secondary | ICD-10-CM

## 2015-08-23 ENCOUNTER — Ambulatory Visit (HOSPITAL_COMMUNITY): Admission: RE | Admit: 2015-08-23 | Payer: Medicare Other | Source: Ambulatory Visit

## 2015-08-30 ENCOUNTER — Ambulatory Visit (HOSPITAL_COMMUNITY)
Admission: RE | Admit: 2015-08-30 | Discharge: 2015-08-30 | Disposition: A | Discharge status: Home / Self Care | Payer: Medicare Other | Source: Ambulatory Visit | Attending: Interventional Radiology | Admitting: Interventional Radiology

## 2015-08-30 DIAGNOSIS — M549 Dorsalgia, unspecified: Secondary | ICD-10-CM

## 2015-08-30 DIAGNOSIS — IMO0002 Reserved for concepts with insufficient information to code with codable children: Secondary | ICD-10-CM

## 2015-08-30 DIAGNOSIS — S3210XD Unspecified fracture of sacrum, subsequent encounter for fracture with routine healing: Secondary | ICD-10-CM | POA: Diagnosis not present

## 2015-08-31 ENCOUNTER — Telehealth: Payer: Self-pay | Admitting: Family Medicine

## 2015-08-31 DIAGNOSIS — Z780 Asymptomatic menopausal state: Secondary | ICD-10-CM

## 2015-08-31 NOTE — Telephone Encounter (Signed)
Orders were placed today, can you get pt scheduled?

## 2015-08-31 NOTE — Telephone Encounter (Signed)
Ok to place orders? Pt has CPE with [facility] 10/08/15

## 2015-08-31 NOTE — Telephone Encounter (Signed)
Ok to order DEXA dx- postmenopausal estrogen deficiency

## 2015-08-31 NOTE — Telephone Encounter (Signed)
Relation to RD:EYCX Call back [phone]  Reason for call:  Patient requesting Bone Density orders

## 2015-09-02 ENCOUNTER — Encounter: Payer: Self-pay | Admitting: General Practice

## 2015-09-02 ENCOUNTER — Ambulatory Visit (HOSPITAL_BASED_OUTPATIENT_CLINIC_OR_DEPARTMENT_OTHER)
Admission: RE | Admit: 2015-09-02 | Discharge: 2015-09-02 | Disposition: A | Discharge status: Home / Self Care | Payer: Medicare Other | Source: Ambulatory Visit | Attending: Family Medicine | Admitting: Family Medicine

## 2015-09-02 DIAGNOSIS — Z780 Asymptomatic menopausal state: Secondary | ICD-10-CM | POA: Diagnosis not present

## 2015-09-02 DIAGNOSIS — E559 Vitamin D deficiency, unspecified: Secondary | ICD-10-CM | POA: Diagnosis not present

## 2015-09-02 DIAGNOSIS — M810 Age-related osteoporosis without current pathological fracture: Secondary | ICD-10-CM | POA: Diagnosis not present

## 2015-09-06 ENCOUNTER — Ambulatory Visit (HOSPITAL_COMMUNITY): Payer: Medicare Other

## 2015-09-06 DIAGNOSIS — Z23 Encounter for immunization: Secondary | ICD-10-CM | POA: Diagnosis not present

## 2015-09-08 DIAGNOSIS — M4848XA Fatigue fracture of vertebra, sacral and sacrococcygeal region, initial encounter for fracture: Secondary | ICD-10-CM | POA: Diagnosis not present

## 2015-09-08 DIAGNOSIS — M810 Age-related osteoporosis without current pathological fracture: Secondary | ICD-10-CM | POA: Diagnosis not present

## 2015-09-29 ENCOUNTER — Encounter: Payer: Medicare Other | Admitting: Family Medicine

## 2015-10-13 ENCOUNTER — Telehealth: Payer: Self-pay

## 2015-10-14 ENCOUNTER — Encounter: Payer: Self-pay | Admitting: Internal Medicine

## 2015-10-14 ENCOUNTER — Ambulatory Visit (INDEPENDENT_AMBULATORY_CARE_PROVIDER_SITE_OTHER): Payer: Medicare Other | Admitting: Internal Medicine

## 2015-10-14 VITALS — BP 126/74 | HR 84 | Temp 98.2°F | Ht 66.0 in | Wt 156.1 lb

## 2015-10-14 DIAGNOSIS — J439 Emphysema, unspecified: Secondary | ICD-10-CM

## 2015-10-14 DIAGNOSIS — I10 Essential (primary) hypertension: Secondary | ICD-10-CM

## 2015-10-14 DIAGNOSIS — M810 Age-related osteoporosis without current pathological fracture: Secondary | ICD-10-CM | POA: Diagnosis not present

## 2015-10-14 DIAGNOSIS — Z09 Encounter for follow-up examination after completed treatment for conditions other than malignant neoplasm: Secondary | ICD-10-CM

## 2015-10-14 DIAGNOSIS — E559 Vitamin D deficiency, unspecified: Secondary | ICD-10-CM | POA: Diagnosis not present

## 2015-10-14 DIAGNOSIS — S3210XA Unspecified fracture of sacrum, initial encounter for closed fracture: Secondary | ICD-10-CM | POA: Insufficient documentation

## 2015-10-14 LAB — BASIC METABOLIC PANEL WITH GFR
BUN: 14 mg/dL (ref 7–25)
CO2: 26 mmol/L (ref 20–31)
Calcium: 9.1 mg/dL (ref 8.6–10.4)
Chloride: 102 mmol/L (ref 98–110)
Creat: 0.68 mg/dL (ref 0.60–0.93)
Glucose, Bld: 98 mg/dL (ref 65–99)
Potassium: 4.4 mmol/L (ref 3.5–5.3)
Sodium: 138 mmol/L (ref 135–146)

## 2015-10-14 NOTE — Progress Notes (Signed)
Subjective:    Patient ID: Dominique [name], female    DOB: Jan 14, 1941, 74 y.o.   MRN: [id number]  DOS:  10/14/2015 Type of visit - description : New patient, transferring Interval history:  HTN: Good compliance of medication, ambulatory BPs reportedly very good. MSK: Since the last time I saw her, she was diagnosed with a sacral fracture, had a sacro plasty, currently doing well, no pain, not taking any narcotics. Osteoporosis: On Fosamax. History of vitamin D deficiency: Request to check a vitamin D level. High cholesterol: Good compliance with medication.   Review of Systems Denies chest pain or difficulty breathing No nausea, vomiting, diarrhea Very seldom has cough, usually associated with URIs, asymptomatic at this point No dysphagia or odynophagia  Past Medical History  Diagnosis Date  . Emphysema lung ([place])   . Cardiac arrhythmia due to congenital heart disease   . Heart murmur   . Hypertension   . History of blood transfusion 1971    with C-section complication  . UTI (lower urinary tract infection)   . Sacral fracture [facility])     Past Surgical History  Procedure Laterality Date  . Appendectomy  1951  . Cesarean section      1971  . Tubal ligation  1975  . Laminectomy    . Total hip arthroplasty Bilateral   . Cataract extraction    . Joint replacement      Social History   Social History  . Marital Status: Widowed    Spouse Name: N/A  . Number of Children: 2  . Years of Education: N/A   Occupational History  . n/a    Social History Main Topics  . Smoking status: Former Smoker -- 1.00 packs/day for 40 years    Types: Cigarettes    Quit date: 03/19/2004  . Smokeless tobacco: Never Used  . Alcohol Use: 0.0 oz/week    0 Standard drinks or equivalent per week  . Drug Use: No  . Sexual Activity: Not on file   Other Topics Concern  . Not on file   Social History Narrative   From [place]   1 living son        Medication List       This  list is accurate as of: 10/14/15 11:59 PM.  Always use your most recent med list.               acetaminophen 500 MG tablet  Commonly known as:  TYLENOL  Take 500 mg by mouth 2 (two) times daily as needed (pain). Reported on 10/13/2015     alendronate-cholecalciferol 70-2800 MG-UNIT tablet  Commonly known as:  FOSAMAX PLUS D  Take 1 tablet by mouth every 7 (seven) days. Take with a full glass of water on an empty stomach.     amLODipine 5 MG tablet  Commonly known as:  NORVASC  Take 1 tablet (5 mg total) by mouth daily.     aspirin EC 81 MG tablet  Take 81 mg by mouth daily.     benazepril 20 MG tablet  Commonly known as:  LOTENSIN  Take 20 mg by mouth daily.     CALCIUM 600 + D PO  Take 600 mg by mouth daily.     multivitamin with minerals Tabs tablet  Take 1 tablet by mouth daily.     pravastatin 20 MG tablet  Commonly known as:  PRAVACHOL  Take 1 tablet (20 mg total) by mouth daily.  Vitamin D3 5000 UNITS Caps  Take 5,000 Units by mouth daily.           Objective:   Physical Exam BP 126/74 mmHg  Pulse 84  Temp(Src) 98.2 F (36.8 C) (Oral)  Ht 5\' 6"  (1.676 m)  Wt 156 lb 2 oz (70.818 kg)  BMI 25.21 kg/m2  SpO2 97% General:   Well developed, well nourished . NAD.  HEENT:  Normocephalic . Face symmetric, atraumatic Lungs:  CTA B Normal respiratory effort, no intercostal retractions, no accessory muscle use. Heart: RRR, systolic murmur.  No pretibial edema bilaterally  Skin: Not pale. Not jaundice Neurologic:  alert & oriented X3.  Speech normal, gait appropriate for age and unassisted Psych--  Cognition and judgment appear intact.  Cooperative with normal attention span and concentration.  Behavior appropriate. No anxious or depressed appearing.      Assessment & Plan:   Assessment HTN Hyperlipidemia H/o Emphysema, remote, PFTs rx 2016 but not done Former smoker, quit 2006 H/o murmur (since childhood), no echo DJD Sacral Fx, s/p  sacroplasty (radiology) 07-2015 H/o osteoporosis  --took fosamax while in [place], Fosamax rx again by ortho after sacral Fx 07-2015, trying to get prolia --T score 08-2015 >> normal (likely normal d/t DJD?) H/o vit D def   PLAN HTN: Well controlled on ACE inhibitor, check a BMP History of emphysema: Asymptomatic, usually has persisting cough only when she has a URI Osteoporosis: Remote history of osteoporosis, had a sacral fracture, that lead to a DEXA which was normal. (likely bc has  DJD), she has restarted Fosamax and is hoping to get Prolia (managed by ortho) History of vitamin D deficiency: Check labs. Primary care: Declined a Td RTC 4-5 months, physical exam

## 2015-10-14 NOTE — Patient Instructions (Signed)
Get your blood work before you leave     Check the  blood pressure 2 or 3 times a month    Be sure your blood pressure is between 110/65 and  145/85.  if it is consistently higher or lower, let me know   Next visit  for a complete physical exam in 4-5 months   Please schedule an appointment at the front desk Please come back fasting

## 2015-10-14 NOTE — Progress Notes (Signed)
Pre visit review using our clinic review tool, if applicable. No additional management support is needed unless otherwise documented below in the visit note. 

## 2015-10-15 DIAGNOSIS — Z09 Encounter for follow-up examination after completed treatment for conditions other than malignant neoplasm: Secondary | ICD-10-CM | POA: Insufficient documentation

## 2015-10-15 LAB — VITAMIN D 25 HYDROXY (VIT D DEFICIENCY, FRACTURES): VIT D 25 HYDROXY: 64 ng/mL (ref 30–100)

## 2015-10-15 NOTE — Assessment & Plan Note (Signed)
HTN: Well controlled on ACE inhibitor, check a BMP History of emphysema: Asymptomatic, usually has persisting cough only when she has a URI Osteoporosis: Remote history of osteoporosis, had a sacral fracture, that lead to a DEXA which was normal. (likely bc has  DJD), she has restarted Fosamax and is hoping to get Prolia (managed by ortho) History of vitamin D deficiency: Check labs. Primary care: Declined a Td RTC 4-5 months, physical exam

## 2015-10-18 NOTE — Telephone Encounter (Signed)
Pre Visit call completed. 

## 2015-11-14 DIAGNOSIS — M4848XA Fatigue fracture of vertebra, sacral and sacrococcygeal region, initial encounter for fracture: Secondary | ICD-10-CM | POA: Diagnosis not present

## 2015-11-29 ENCOUNTER — Other Ambulatory Visit (INDEPENDENT_AMBULATORY_CARE_PROVIDER_SITE_OTHER): Payer: Medicare Other

## 2015-11-29 ENCOUNTER — Telehealth: Payer: Self-pay

## 2015-11-29 ENCOUNTER — Other Ambulatory Visit: Payer: Self-pay

## 2015-11-29 DIAGNOSIS — M4848XA Fatigue fracture of vertebra, sacral and sacrococcygeal region, initial encounter for fracture: Secondary | ICD-10-CM | POA: Diagnosis not present

## 2015-11-29 DIAGNOSIS — M810 Age-related osteoporosis without current pathological fracture: Secondary | ICD-10-CM

## 2015-11-29 LAB — BASIC METABOLIC PANEL
BUN: 24 mg/dL — AB (ref 6–23)
CALCIUM: 9.3 mg/dL (ref 8.4–10.5)
CO2: 26 meq/L (ref 19–32)
CREATININE: 0.74 mg/dL (ref 0.40–1.20)
Chloride: 106 mEq/L (ref 96–112)
GFR: 81.19 mL/min (ref >60.00)
Glucose, Bld: 90 mg/dL (ref 70–99)
Potassium: 4 mEq/L (ref 3.5–5.1)
Sodium: 139 mEq/L (ref 135–145)

## 2015-11-29 NOTE — Telephone Encounter (Signed)
Pt brought in Rx for labs: Calcium and BMET from [place], dx: M81.0 and M48.48XA. Okay per Dr. Larose Kells to draw labs and fax to [facility].

## 2015-11-30 NOTE — Telephone Encounter (Signed)
Results faxed to [facility]

## 2015-12-06 ENCOUNTER — Telehealth: Payer: Self-pay | Admitting: Internal Medicine

## 2015-12-06 MED ORDER — PRAVASTATIN SODIUM 20 MG PO TABS: 20.0000 mg | ORAL_TABLET | Freq: Every day | ORAL | Status: DC

## 2015-12-06 MED ORDER — AMLODIPINE BESYLATE 5 MG PO TABS: 5.0000 mg | ORAL_TABLET | Freq: Every day | ORAL | Status: DC

## 2015-12-06 MED ORDER — BENAZEPRIL HCL 20 MG PO TABS: 20.0000 mg | ORAL_TABLET | Freq: Every day | ORAL | Status: DC

## 2015-12-06 NOTE — Telephone Encounter (Signed)
Rxs sent

## 2015-12-06 NOTE — Telephone Encounter (Signed)
°  Relationship to patient: Pharmacy Can be reached: [phone] Pharmacy:  [id number] - [place], [place] [phone] (Phone) [phone] (Fax)         Reason for call: Pharmacy requesting refill on amLODipine (NORVASC) 5 MG tablet [id number] and benazepril (LOTENSIN) 20 MG tablet [id number] and pravastatin (PRAVACHOL) 20 MG tablet [id number]

## 2015-12-14 NOTE — Telephone Encounter (Signed)
Spoke w/ Pt informed her of lab results from 11/29/2015, Pt verbalized understanding and stated the lab work was for her to receive "Polio shot." She wanted this documented in her chart. Informed her I would.

## 2015-12-14 NOTE — Telephone Encounter (Signed)
Patient inquiring about lab results taken 11/29/15

## 2016-01-31 IMAGING — DX DG CHEST 2V
2 series · 2 of 2 positions shown · non-contrast
Comparison: None.

CLINICAL DATA: 73-year-old female with a history of chest
congestion and productive cough.

EXAM:
CHEST - 2 VIEW

[chest pa]
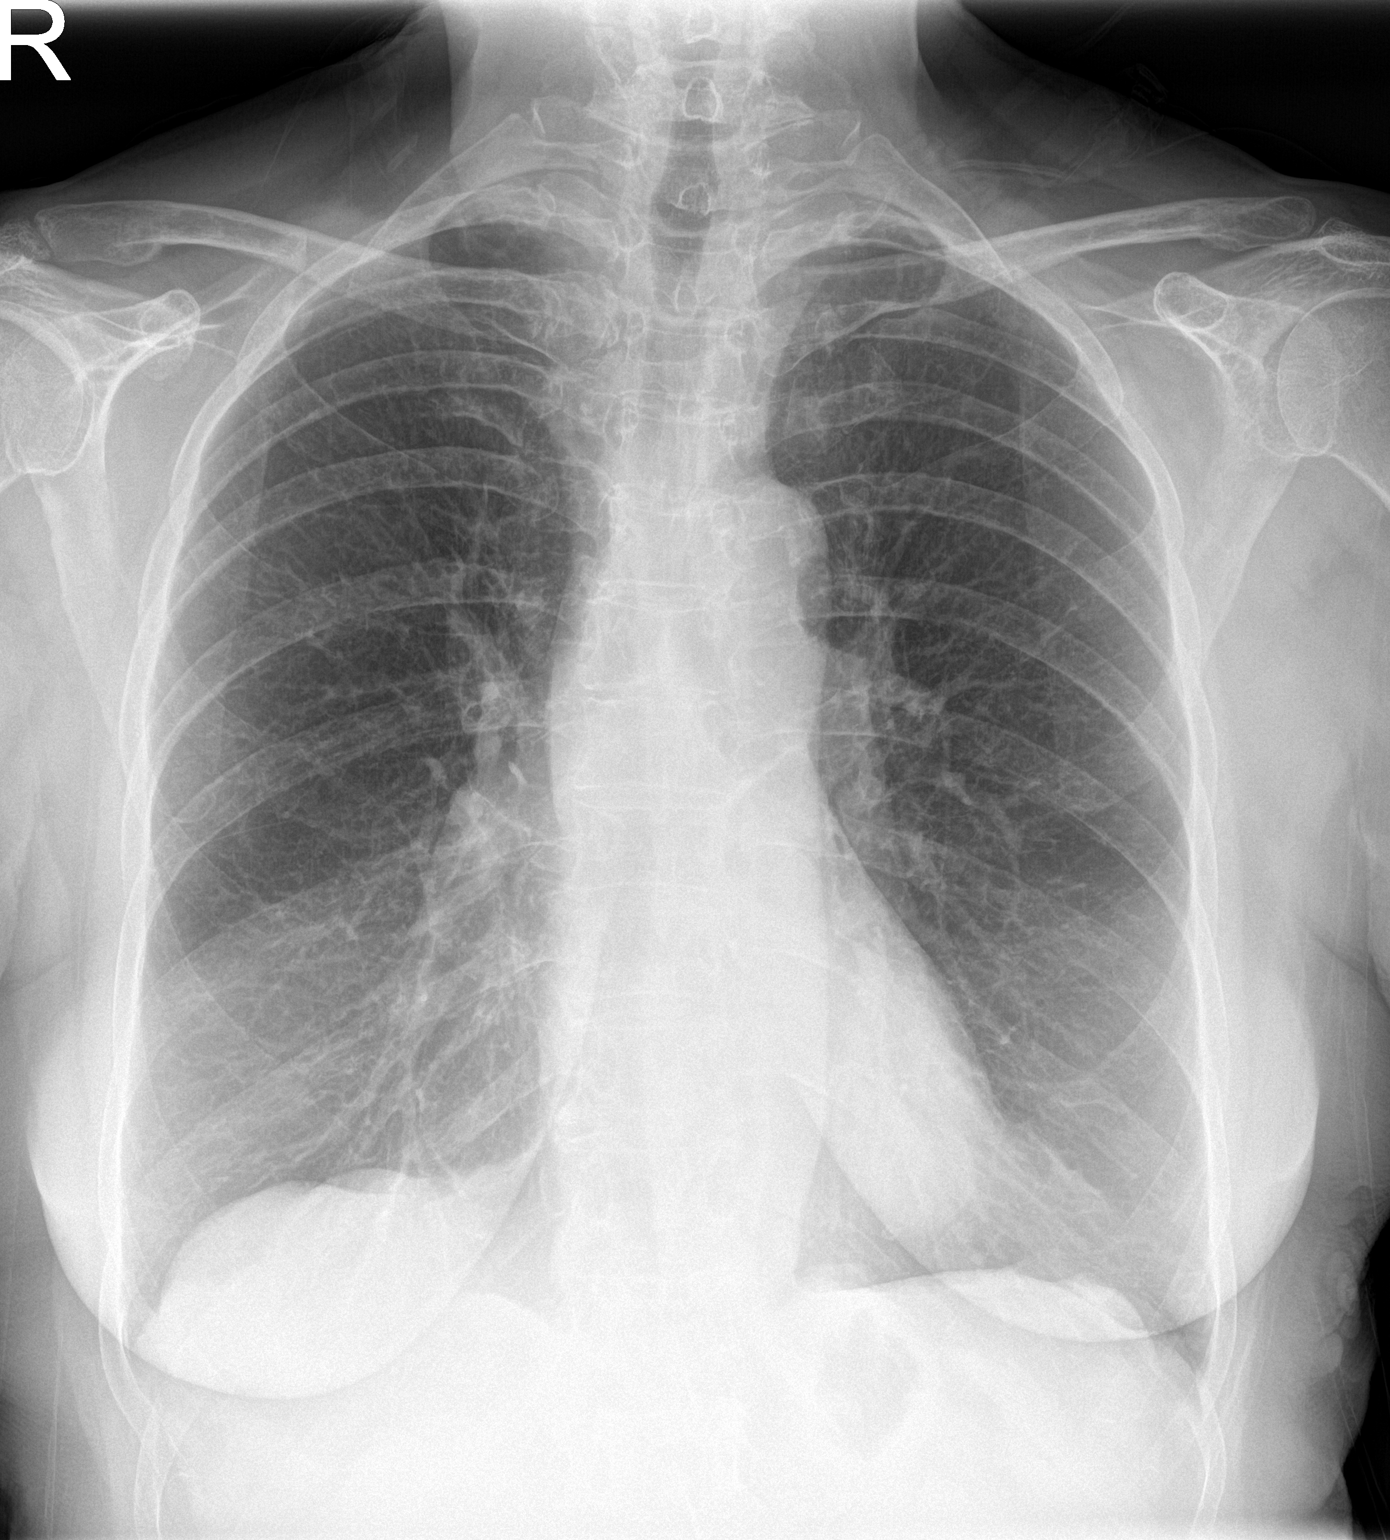

[chest lat]
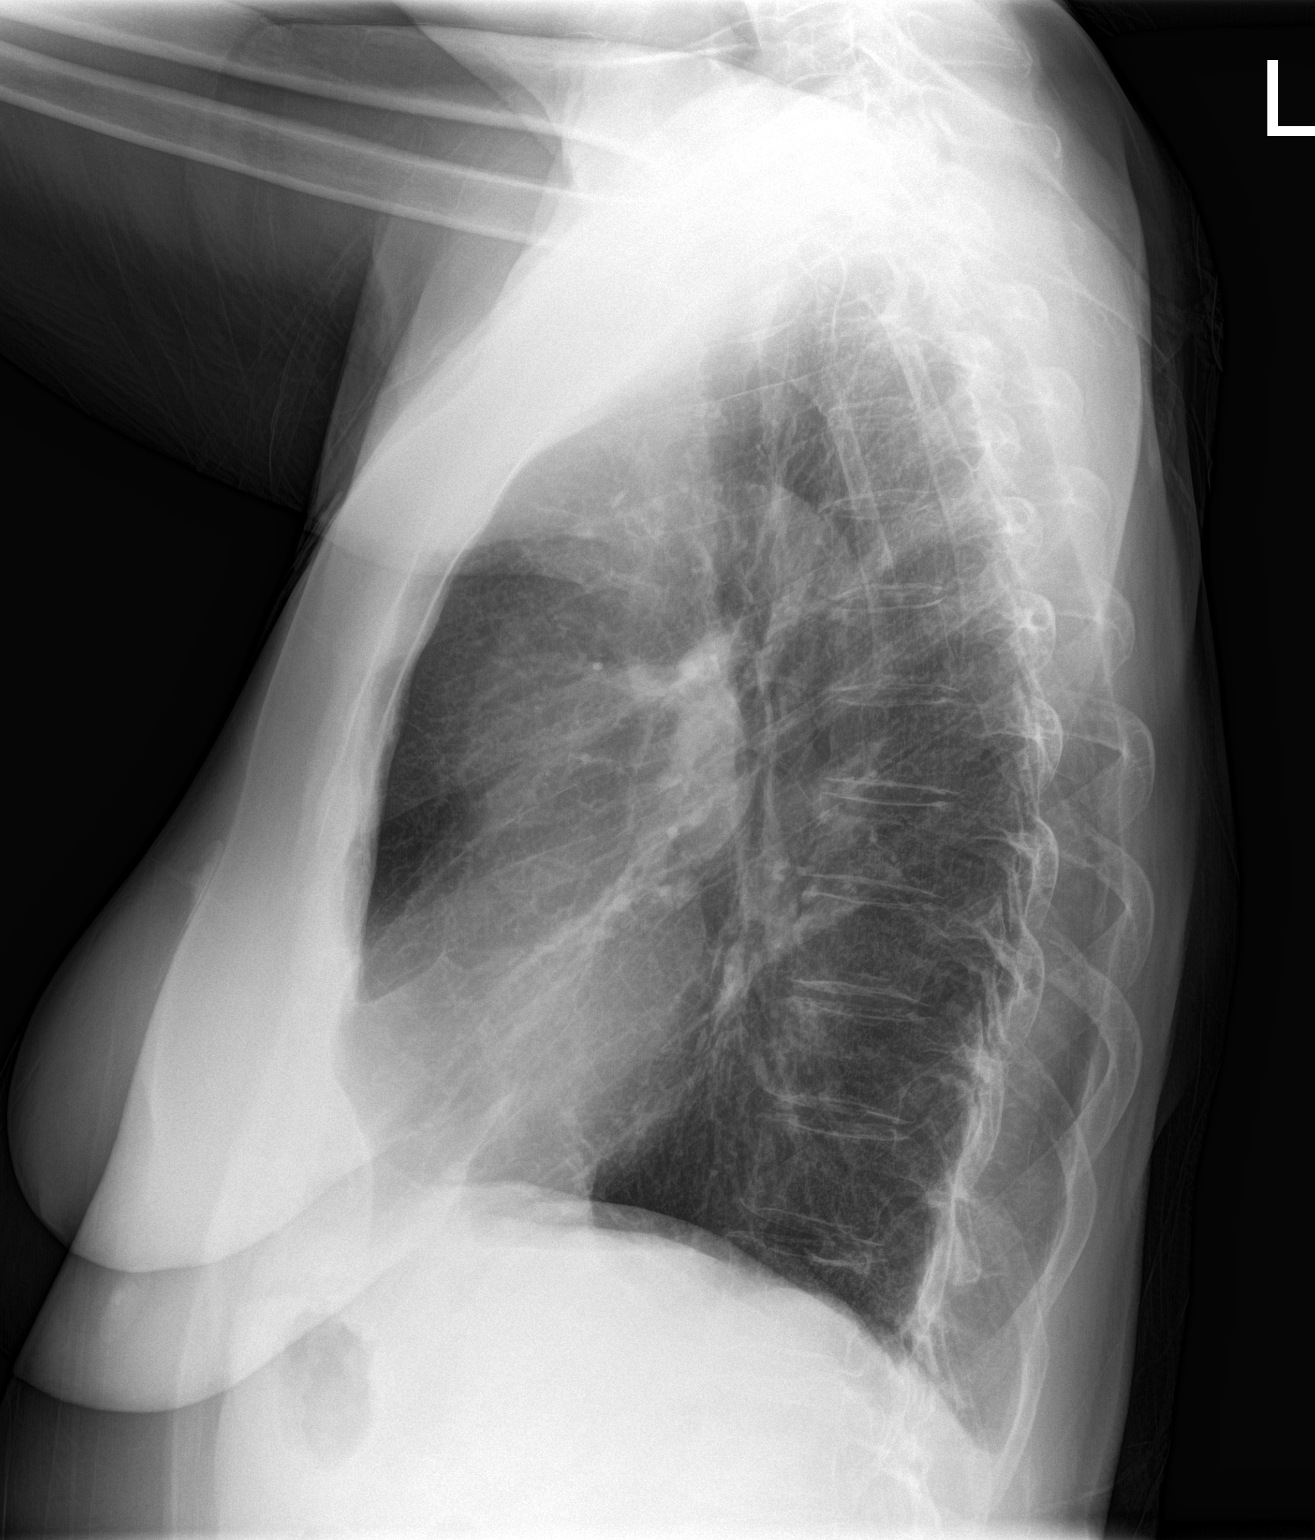

[2 of 2 positions shown; findings below may reference images not displayed]

FINDINGS: Cardiomediastinal silhouette projects within normal limits in size
and contour. No confluent airspace disease, pneumothorax, or pleural
effusion.

No displaced fracture.

Unremarkable appearance of the upper abdomen.
IMPRESSION: No radiographic evidence of acute cardiopulmonary disease.

## 2016-04-24 ENCOUNTER — Ambulatory Visit (INDEPENDENT_AMBULATORY_CARE_PROVIDER_SITE_OTHER): Payer: Medicare Other | Admitting: Internal Medicine

## 2016-04-24 ENCOUNTER — Telehealth: Payer: Self-pay

## 2016-04-24 ENCOUNTER — Encounter: Payer: Self-pay | Admitting: Internal Medicine

## 2016-04-24 VITALS — BP 130/68 | HR 76 | Temp 98.0°F | Ht 66.0 in | Wt 156.0 lb

## 2016-04-24 DIAGNOSIS — T148 Other injury of unspecified body region: Secondary | ICD-10-CM

## 2016-04-24 DIAGNOSIS — Z23 Encounter for immunization: Secondary | ICD-10-CM

## 2016-04-24 DIAGNOSIS — I10 Essential (primary) hypertension: Secondary | ICD-10-CM | POA: Diagnosis not present

## 2016-04-24 DIAGNOSIS — E785 Hyperlipidemia, unspecified: Secondary | ICD-10-CM

## 2016-04-24 DIAGNOSIS — J439 Emphysema, unspecified: Secondary | ICD-10-CM | POA: Diagnosis not present

## 2016-04-24 DIAGNOSIS — Z0000 Encounter for general adult medical examination without abnormal findings: Secondary | ICD-10-CM | POA: Diagnosis not present

## 2016-04-24 DIAGNOSIS — R21 Rash and other nonspecific skin eruption: Secondary | ICD-10-CM

## 2016-04-24 DIAGNOSIS — W57XXXA Bitten or stung by nonvenomous insect and other nonvenomous arthropods, initial encounter: Secondary | ICD-10-CM

## 2016-04-24 DIAGNOSIS — Z09 Encounter for follow-up examination after completed treatment for conditions other than malignant neoplasm: Secondary | ICD-10-CM

## 2016-04-24 DIAGNOSIS — R011 Cardiac murmur, unspecified: Secondary | ICD-10-CM | POA: Diagnosis not present

## 2016-04-24 DIAGNOSIS — Z1211 Encounter for screening for malignant neoplasm of colon: Secondary | ICD-10-CM

## 2016-04-24 LAB — BASIC METABOLIC PANEL
BUN: 15 mg/dL (ref 6–23)
CALCIUM: 9.4 mg/dL (ref 8.4–10.5)
CHLORIDE: 103 meq/L (ref 96–112)
CO2: 30 mEq/L (ref 19–32)
CREATININE: 0.77 mg/dL (ref 0.40–1.20)
GFR: 77.47 mL/min (ref >60.00)
Glucose, Bld: 96 mg/dL (ref 70–99)
Potassium: 4.1 mEq/L (ref 3.5–5.1)
Sodium: 138 mEq/L (ref 135–145)

## 2016-04-24 LAB — LIPID PANEL
CHOL/HDL RATIO: 3
Cholesterol: 223 mg/dL — ABNORMAL HIGH (ref 0–200)
HDL: 78.1 mg/dL (ref >39.00)
LDL Cholesterol: 126 mg/dL — ABNORMAL HIGH (ref 0–99)
NONHDL: 144.92
Triglycerides: 96 mg/dL (ref 0.0–149.0)
VLDL: 19.2 mg/dL (ref 0.0–40.0)

## 2016-04-24 LAB — CBC WITH DIFFERENTIAL/PLATELET
BASOS ABS: 0 10*3/uL (ref 0.0–0.1)
Basophils Relative: 0.4 % (ref 0.0–3.0)
EOS PCT: 9.4 % — AB (ref 0.0–5.0)
Eosinophils Absolute: 0.7 10*3/uL (ref 0.0–0.7)
HEMATOCRIT: 41.7 % (ref 36.0–46.0)
HEMOGLOBIN: 13.9 g/dL (ref 12.0–15.0)
LYMPHS ABS: 2 10*3/uL (ref 0.7–4.0)
LYMPHS PCT: 25.6 % (ref 12.0–46.0)
MCHC: 33.3 g/dL (ref 30.0–36.0)
MCV: 90.7 fl (ref 78.0–100.0)
MONOS PCT: 9 % (ref 3.0–12.0)
Monocytes Absolute: 0.7 10*3/uL (ref 0.1–1.0)
NEUTROS PCT: 55.6 % (ref 43.0–77.0)
Neutro Abs: 4.3 10*3/uL (ref 1.4–7.7)
Platelets: 318 10*3/uL (ref 150.0–400.0)
RBC: 4.6 Mil/uL (ref 3.87–5.11)
RDW: 14.9 % (ref 11.5–15.5)
WBC: 7.8 10*3/uL (ref 4.0–10.5)

## 2016-04-24 LAB — AST: AST: 26 U/L (ref 0–37)

## 2016-04-24 LAB — ALT: ALT: 22 U/L (ref 0–35)

## 2016-04-24 MED ORDER — TRIAMCINOLONE ACETONIDE 0.1 % EX LOTN: 1.0000 "application " | TOPICAL_LOTION | Freq: Two times a day (BID) | CUTANEOUS | Status: DC | PRN

## 2016-04-24 NOTE — Telephone Encounter (Signed)
Prolia insurance verification initiated via online portal. Awaiting determination. Verification request form sent for scanning.

## 2016-04-24 NOTE — Progress Notes (Signed)
Pre visit review using our clinic review tool, if applicable. No additional management support is needed unless otherwise documented below in the visit note. 

## 2016-04-24 NOTE — Progress Notes (Signed)
Subjective:    Patient ID: Dominique [name], female    DOB: 09/22/1941, 75 y.o.   MRN: [id number]  DOS:  04/24/2016 Type of visit - description :  Interval history: Here for Medicare AWV:  1. Risk factors based on Past M, S, F history: reviewed 2. Physical Activities:  Takes walks  3. Depression/mood: neg screening  4. Hearing:  No problems noted or reported  5. ADL's: independent, drives  6. Fall Risk: no recent falls, prevention discussed , see AVS 7. home Safety: does feel safe at home  8. Height, weight, & visual acuity: see VS, sees eye doctor once a year 9. Counseling: provided 10. Labs ordered based on risk factors: if needed  11. Referral Coordination: if needed 12. Care Plan, see assessment and plan , written personalized plan provided , see AVS 13. Cognitive Assessment: motor skills and cognition appropriate for age 19. Care team updated, see gyn Dr Lynnette Caffey  15. End-of-life care discussed , states has a HC-POA  In addition, today we discussed the following: Osteoporosis, request to get her PROLIA #2 here DJD: Currently pain is not an issue DM: Good med compliance High cholesterol: Good med compliance without apparent side effects. Complains of a rash at the abdomen, chest, back and arms, started  right after she came back from a trip to [place] (she slept  in a cruise ship).  Had a tick bite 6 weeks ago and the right groin, she removed it, did not have a rash afterwards. No fever, chills, headache, lack of energy or aches.   Review of Systems  Constitutional: No fever. No chills. No unexplained wt changes. No unusual sweats  HEENT: No dental problems, no ear discharge, no facial swelling, no voice changes. No eye discharge, no eye  redness , no  intolerance to light   Respiratory: No wheezing , no  difficulty breathing. No cough , no mucus production  Cardiovascular: No CP, + lower extremity edema when she flies (already uses compression stockings:, Recommend  isometric exercises); no  Palpitations  GI: no nausea, no vomiting, no diarrhea , no  abdominal pain.  No blood in the stools. No dysphagia, no odynophagia    Endocrine: No polyphagia, no polyuria , no polydipsia  GU: No dysuria, gross hematuria, difficulty urinating. No urinary urgency, no frequency.  Musculoskeletal: No joint swellings or unusual aches or pains  Skin: No change in the color of the skin, palor   Allergic, immunologic: No environmental allergies , no  food allergies  Neurological: No dizziness no  syncope. No headaches. No diplopia, no slurred, no slurred speech, no motor deficits, no facial  Numbness  Hematological: No enlarged lymph nodes, no easy bruising , no unusual bleedings  Psychiatry: No suicidal ideas, no hallucinations, no beavior problems, no confusion.  No unusual/severe anxiety, no depression  Past Medical History  Diagnosis Date  . Emphysema lung ([place])   . Cardiac arrhythmia due to congenital heart disease   . Heart murmur   . Hypertension   . History of blood transfusion 1971    with C-section complication  . UTI (lower urinary tract infection)   . Sacral fracture [facility])     Past Surgical History  Procedure Laterality Date  . Appendectomy  1951  . Cesarean section      1971  . Tubal ligation  1975  . Laminectomy    . Total hip arthroplasty Bilateral   . Cataract extraction    . Joint replacement  Social History   Social History  . Marital Status: Widowed    Spouse Name: N/A  . Number of Children: 2  . Years of Education: N/A   Occupational History  . n/a    Social History Main Topics  . Smoking status: Former Smoker -- 1.00 packs/day for 40 years    Types: Cigarettes    Quit date: 03/19/2004  . Smokeless tobacco: Never Used  . Alcohol Use: 0.0 oz/week    0 Standard drinks or equivalent per week  . Drug Use: No  . Sexual Activity: Not on file   Other Topics Concern  . Not on file   Social History Narrative   From  [place]   1 living son     Family History  Problem Relation Age of Onset  . Breast cancer Mother     breast  . Cancer Brother     lung  . Diabetes Son     type 1  . Colon cancer Neg Hx   . CAD Neg Hx        Medication List       This list is accurate as of: 04/24/16  5:11 PM.  Always use your most recent med list.               acetaminophen 500 MG tablet  Commonly known as:  TYLENOL  Take 500 mg by mouth 2 (two) times daily as needed (pain). Reported on 04/24/2016     amLODipine 5 MG tablet  Commonly known as:  NORVASC  Take 1 tablet (5 mg total) by mouth daily.     aspirin EC 81 MG tablet  Take 81 mg by mouth daily.     benazepril 20 MG tablet  Commonly known as:  LOTENSIN  Take 1 tablet (20 mg total) by mouth daily.     CALCIUM 600 + D PO  Take 600 mg by mouth daily.     denosumab 60 MG/ML Soln injection  Commonly known as:  PROLIA  Inject 60 mg into the skin every 6 (six) months. Administer in upper arm, thigh, or abdomen     multivitamin with minerals Tabs tablet  Take 1 tablet by mouth daily.     pravastatin 20 MG tablet  Commonly known as:  PRAVACHOL  Take 1 tablet (20 mg total) by mouth daily.     triamcinolone lotion 0.1 %  Commonly known as:  KENALOG  Apply 1 application topically 2 (two) times daily as needed.     Vitamin D3 5000 units Caps  Take 5,000 Units by mouth daily.           Objective:   Physical Exam BP 130/68 mmHg  Pulse 76  Temp(Src) 98 F (36.7 C) (Oral)  Ht 5\' 6"  (1.676 m)  Wt 156 lb (70.761 kg)  BMI 25.19 kg/m2  SpO2 97%  General:   Well developed, well nourished . NAD.  Neck: No  thyromegaly  HEENT:  Normocephalic . Face symmetric, atraumatic Lungs:  CTA B Normal respiratory effort, no intercostal retractions, no accessory muscle use. Heart: RRR,  + systolic murmur most noticeable at the right sternal border.  No pretibial edema bilaterally  Abdomen:  Not distended, soft, non-tender. No rebound or  rigidity.   Skin: There is subtle rash at the lower abdomen=upper chest>back>arms: minute macular erythematous lesions , no burrows, no excoriations Interdigital skin and wrists without obvious lesions. Area of previous  tick bite at the right groin normal. Neurologic:  alert & oriented X3.  Speech normal, gait appropriate for age and unassisted Strength symmetric and appropriate for age.  Psych: Cognition and judgment appear intact.  Cooperative with normal attention span and concentration.  Behavior appropriate. No anxious or depressed appearing.    Assessment & Plan:   Assessment HTN Hyperlipidemia PULM: --H/o Emphysema, remote, PFTs rx 2016 but not done --Former smoker, quit 2006 H/o murmur (since childhood), no echo MSK --DJD --Sacral Fx, sacral insufficiency, s/p sacroplasty (radiology) 07-2015 --H/o osteoporosis : took fosamax while in [place], Fosamax rx again by ortho after sacral Fx 07-2015, never took it, s/p #1 prolia 11-14-2015 --T score 08-2015 >> normal (likely normal d/t DJD?) H/o vit D def: Normal level 09-2015  PLAN HTN: Continue amlodipine, Lotensin. Check a BMP, CBC Hyperlipidemia: Continue Pravachol, check a FLP, AST ALT Emphysema: Asx, no recent PFTs. Heart murmur: Asx, check Echo Osteoporosis: Due for a prolia likes to transfer her care here. Will arrange Rash: Very subtle rash after a trip to [place], no burrows, bed bugs?.  Rx mild with topical steroids, if not better will call for a derm referral. Tick bite: Did not have a rash, no symptoms. Observation RTC 6 months

## 2016-04-24 NOTE — Assessment & Plan Note (Signed)
Td- today pnm shot 2012; prevnar 2015 zostavax : very reluctant to have , benefits discussed- declined   Female care: per Dr Lynnette Caffey  [facility] 2016   Had 2-3 Cscopes before, +polyps,  last  ~ 2011 per pt  normal, no report: was rec to recheck in 5 years  request a GI referral, will do

## 2016-04-24 NOTE — Assessment & Plan Note (Signed)
HTN: Continue amlodipine, Lotensin. Check a BMP, CBC Hyperlipidemia: Continue Pravachol, check a FLP, AST ALT Emphysema: Asx, no recent PFTs. Heart murmur: Asx, check Echo Osteoporosis: Due for a prolia likes to transfer her care here. Will arrange Rash: Very subtle rash after a trip to [place], no burrows, bed bugs?.  Rx mild with topical steroids, if not better will call for a derm referral. Tick bite: Did not have a rash, no symptoms. Observation RTC 6 months

## 2016-04-24 NOTE — Patient Instructions (Signed)
Get your blood work before you leave   Next visit in 6 months, please make an appointment  Referring you to GI, get a echocardiogram, arrange a Prolia  Lotion twice a day as needed, if you continue itching call next week for a dermatology referral  If you develop any problems from a tick bite including headaches, fever, and usual aches: Call the office immediately  Fall Prevention and Home Safety Falls cause injuries and can affect all age groups. It is possible to use preventive measures to significantly decrease the likelihood of falls. There are many simple measures which can make your home safer and prevent falls. OUTDOORS  Repair cracks and edges of walkways and driveways.  Remove high doorway thresholds.  Trim shrubbery on the main path into your home.  Have good outside lighting.  Clear walkways of tools, rocks, debris, and clutter.  Check that handrails are not broken and are securely fastened. Both sides of steps should have handrails.  Have leaves, snow, and ice cleared regularly.  Use sand or salt on walkways during winter months.  In the garage, clean up grease or oil spills. BATHROOM  Install night lights.  Install grab bars by the toilet and in the tub and shower.  Use non-skid mats or decals in the tub or shower.  Place a plastic non-slip stool in the shower to sit on, if needed.  Keep floors dry and clean up all water on the floor immediately.  Remove soap buildup in the tub or shower on a regular basis.  Secure bath mats with non-slip, double-sided rug tape.  Remove throw rugs and tripping hazards from the floors. BEDROOMS  Install night lights.  Make sure a bedside light is easy to reach.  Do not use oversized bedding.  Keep a telephone by your bedside.  Have a firm chair with side arms to use for getting dressed.  Remove throw rugs and tripping hazards from the floor. KITCHEN  Keep handles on pots and pans turned toward the center of  the stove. Use back burners when possible.  Clean up spills quickly and allow time for drying.  Avoid walking on wet floors.  Avoid hot utensils and knives.  Position shelves so they are not too high or low.  Place commonly used objects within easy reach.  If necessary, use a sturdy step stool with a grab bar when reaching.  Keep electrical cables out of the way.  Do not use floor polish or wax that makes floors slippery. If you must use wax, use non-skid floor wax.  Remove throw rugs and tripping hazards from the floor. STAIRWAYS  Never leave objects on stairs.  Place handrails on both sides of stairways and use them. Fix any loose handrails. Make sure handrails on both sides of the stairways are as long as the stairs.  Check carpeting to make sure it is firmly attached along stairs. Make repairs to worn or loose carpet promptly.  Avoid placing throw rugs at the top or bottom of stairways, or properly secure the rug with carpet tape to prevent slippage. Get rid of throw rugs, if possible.  Have an electrician put in a light switch at the top and bottom of the stairs. OTHER FALL PREVENTION TIPS  Wear low-heel or rubber-soled shoes that are supportive and fit well. Wear closed toe shoes.  When using a stepladder, make sure it is fully opened and both spreaders are firmly locked. Do not climb a closed stepladder.  Add color or  contrast paint or tape to grab bars and handrails in your home. Place contrasting color strips on first and last steps.  Learn and use mobility aids as needed. Install an electrical emergency response system.  Turn on lights to avoid dark areas. Replace light bulbs that burn out immediately. Get light switches that glow.  Arrange furniture to create clear pathways. Keep furniture in the same place.  Firmly attach carpet with non-skid or double-sided tape.  Eliminate uneven floor surfaces.  Select a carpet pattern that does not visually hide the  edge of steps.  Be aware of all pets. OTHER HOME SAFETY TIPS  Set the water temperature for 120 F (48.8 C).  Keep emergency numbers on or near the telephone.  Keep smoke detectors on every level of the home and near sleeping areas. Document Released: 10/05/2002 Document Revised: 04/15/2012 Document Reviewed: 01/04/2012 [facility] Patient Information 2015 [place], [place]. This information is not intended to replace advice given to you by your health care provider. Make sure you discuss any questions you have with your health care provider.   Preventive Care for Adults Ages 27 and over  Blood pressure check.** / Every 1 to 2 years.  Lipid and cholesterol check.**/ Every 5 years beginning at age 2.  Lung cancer screening. / Every year if you are aged 72-80 years and have a 30-pack-year history of smoking and currently smoke or have quit within the past 15 years. Yearly screening is stopped once you have quit smoking for at least 15 years or develop a health problem that would prevent you from having lung cancer treatment.  Fecal occult blood test (FOBT) of stool. / Every year beginning at age 36 and continuing until age 65. You may not have to do this test if you get a colonoscopy every 10 years.  Flexible sigmoidoscopy** or colonoscopy.** / Every 5 years for a flexible sigmoidoscopy or every 10 years for a colonoscopy beginning at age 38 and continuing until age 58.  Hepatitis C blood test.** / For all people born from [birth year] through 1966 and any individual with known risks for hepatitis C.  Abdominal aortic aneurysm (AAA) screening.** / A one-time screening for ages 7 to 74 years who are current or former smokers.  Skin self-exam. / Monthly.  Influenza vaccine. / Every year.  Tetanus, diphtheria, and acellular pertussis (Tdap/Td) vaccine.** / 1 dose of Td every 10 years.  Varicella vaccine.** / Consult your health care provider.  Zoster vaccine.** / 1 dose for adults aged 60  years or older.  Pneumococcal 13-valent conjugate (PCV13) vaccine.** / Consult your health care provider.  Pneumococcal polysaccharide (PPSV23) vaccine.** / 1 dose for all adults aged 73 years and older.  Meningococcal vaccine.** / Consult your health care provider.  Hepatitis A vaccine.** / Consult your health care provider.  Hepatitis B vaccine.** / Consult your health care provider.  Haemophilus influenzae type b (Hib) vaccine.** / Consult your health care provider. **Family history and personal history of risk and conditions may change your health care provider's recommendations. Document Released: 12/11/2001 Document Revised: 10/20/2013 Document Reviewed: 03/12/2011 [facility] Patient Information 2015 [place], [place]. This information is not intended to replace advice given to you by your health care provider. Make sure you discuss any questions you have with your health care provider.

## 2016-04-27 NOTE — Telephone Encounter (Signed)
Received Eligibility check from [place]. Estimated out of pocket cost for Prolia is $0. Prolia in fridge for Pt. Eligibility check sent for scanning.

## 2016-04-27 NOTE — Telephone Encounter (Signed)
Tried calling Pt to schedule Prolia injection. No answer, unable to leave message.

## 2016-04-30 DIAGNOSIS — Z1231 Encounter for screening mammogram for malignant neoplasm of breast: Secondary | ICD-10-CM | POA: Diagnosis not present

## 2016-04-30 DIAGNOSIS — Z124 Encounter for screening for malignant neoplasm of cervix: Secondary | ICD-10-CM | POA: Diagnosis not present

## 2016-04-30 DIAGNOSIS — Z6826 Body mass index (BMI) 26.0-26.9, adult: Secondary | ICD-10-CM | POA: Diagnosis not present

## 2016-04-30 DIAGNOSIS — Z01419 Encounter for gynecological examination (general) (routine) without abnormal findings: Secondary | ICD-10-CM | POA: Diagnosis not present

## 2016-04-30 LAB — HM MAMMOGRAPHY

## 2016-05-02 NOTE — Telephone Encounter (Signed)
PROLIA in [place]. Letter printed and mailed to Pt to call and schedule appt for nurse visit.

## 2016-05-07 NOTE — Telephone Encounter (Signed)
Patient scheduled for 05/08/16 at 9:30am with nurse

## 2016-05-07 NOTE — Telephone Encounter (Signed)
Noted  

## 2016-05-08 ENCOUNTER — Ambulatory Visit (INDEPENDENT_AMBULATORY_CARE_PROVIDER_SITE_OTHER): Payer: Medicare Other | Admitting: *Deleted

## 2016-05-08 ENCOUNTER — Ambulatory Visit: Payer: Medicare Other

## 2016-05-08 DIAGNOSIS — S3210XA Unspecified fracture of sacrum, initial encounter for closed fracture: Secondary | ICD-10-CM

## 2016-05-08 MED ORDER — DENOSUMAB 60 MG/ML ~~LOC~~ SOLN: 60.0000 mg | Freq: Once | SUBCUTANEOUS | Status: DC

## 2016-05-08 MED ORDER — DENOSUMAB 60 MG/ML ~~LOC~~ SOLN
60.0000 mg | Freq: Once | SUBCUTANEOUS | Status: AC
  Administered 2016-05-08: 60 mg via SUBCUTANEOUS

## 2016-05-08 NOTE — Progress Notes (Signed)
Pre visit review using our clinic review tool, if applicable. No additional management support is needed unless otherwise documented below in the visit note.  Lab Results  Component Value Date   CALCIUM 9.4 04/24/2016   PHOS 3.5 12/27/2014   Lab Results  Component Value Date   CREATININE 0.77 04/24/2016   Pt here for Prolia injection. She is taking calcium + Vitamin D supplement. Patient tolerated injection well.  Dorrene German, RN

## 2016-05-11 ENCOUNTER — Encounter: Payer: Self-pay | Admitting: Internal Medicine

## 2016-05-15 ENCOUNTER — Other Ambulatory Visit: Payer: Self-pay

## 2016-05-15 ENCOUNTER — Ambulatory Visit (HOSPITAL_COMMUNITY): Payer: Medicare Other | Attending: Internal Medicine

## 2016-05-15 DIAGNOSIS — I352 Nonrheumatic aortic (valve) stenosis with insufficiency: Secondary | ICD-10-CM | POA: Insufficient documentation

## 2016-05-15 DIAGNOSIS — I071 Rheumatic tricuspid insufficiency: Secondary | ICD-10-CM | POA: Diagnosis not present

## 2016-05-15 DIAGNOSIS — I119 Hypertensive heart disease without heart failure: Secondary | ICD-10-CM | POA: Insufficient documentation

## 2016-05-15 DIAGNOSIS — R011 Cardiac murmur, unspecified: Secondary | ICD-10-CM | POA: Diagnosis not present

## 2016-05-17 ENCOUNTER — Other Ambulatory Visit: Payer: Self-pay | Admitting: Family Medicine

## 2016-05-17 DIAGNOSIS — H33321 Round hole, right eye: Secondary | ICD-10-CM | POA: Diagnosis not present

## 2016-05-17 DIAGNOSIS — H10413 Chronic giant papillary conjunctivitis, bilateral: Secondary | ICD-10-CM | POA: Diagnosis not present

## 2016-05-17 DIAGNOSIS — I350 Nonrheumatic aortic (valve) stenosis: Secondary | ICD-10-CM

## 2016-06-05 ENCOUNTER — Ambulatory Visit (INDEPENDENT_AMBULATORY_CARE_PROVIDER_SITE_OTHER): Payer: Medicare Other | Admitting: Cardiology

## 2016-06-05 ENCOUNTER — Encounter: Payer: Self-pay | Admitting: Cardiology

## 2016-06-05 VITALS — BP 142/84 | HR 84 | Ht 66.0 in | Wt 157.6 lb

## 2016-06-05 DIAGNOSIS — R0600 Dyspnea, unspecified: Secondary | ICD-10-CM

## 2016-06-05 DIAGNOSIS — I272 Other secondary pulmonary hypertension: Secondary | ICD-10-CM | POA: Diagnosis not present

## 2016-06-05 DIAGNOSIS — I10 Essential (primary) hypertension: Secondary | ICD-10-CM | POA: Diagnosis not present

## 2016-06-05 DIAGNOSIS — I350 Nonrheumatic aortic (valve) stenosis: Secondary | ICD-10-CM | POA: Diagnosis not present

## 2016-06-05 DIAGNOSIS — R0602 Shortness of breath: Secondary | ICD-10-CM | POA: Diagnosis not present

## 2016-06-05 HISTORY — DX: Pulmonary hypertension, unspecified: I27.20

## 2016-06-05 NOTE — Progress Notes (Signed)
Cardiology Office Note    Date:  06/05/2016   ID:  [name], DOB [birth year], MRN [id number]  PCP:  Dominique November, MD  Cardiologist:  Dominique Him, MD   Chief Complaint  Patient presents with  . Aortic Stenosis  . Hypertension    History of Present Illness:  Dominique [name] is a 75 y.o. female with a history of heart murmur who was noted to have moderate AS on recent echo with normal LVF and grade 1 diastolic dysfunction and is now referred to Cardiology for further evaluation.  She also has a history of HTN.  Echo also showed moderate pulmonary HTN with PASP of 71mmHg with moderate TR, RVH and mildly dilated RV.  She says that she thinks she has had a heart murmur since she was a little girl.  She says that when she walks she gets SOB.  This has been going on for 1-2 years and only occurs with brisk walking and talking at the same time.  She has had some mild chest discomfort that is nonexertional and nonexertional.  It occurs very quickly and is a pressure that lasts a few seconds up to 1-2 minutes.  She denies any diaphoresis or nausea with it.  She has had some night sweats.  She has noticed some LE edema recently after going on 2 different trips after flying.  She denies any palpitations or syncope or claudication.  Occasionally she will have some dizziness when she gets out of bed.      Past Medical History:  Diagnosis Date  . Aortic stenosis, moderate    echo 04/2016  . Cardiac arrhythmia due to congenital heart disease   . Emphysema lung ([facility])   . History of blood transfusion 1971   with C-section complication  . Hypertension   . Pulmonary HTN ([facility]) 06/05/2016   Moderate with PASP 46mmHg by echo 04/2016  . Sacral fracture ([facility])   . UTI (lower urinary tract infection)     Past Surgical History:  Procedure Laterality Date  . APPENDECTOMY  1951  . CATARACT EXTRACTION    . CESAREAN SECTION     1971  . JOINT REPLACEMENT    . LAMINECTOMY    . TOTAL HIP ARTHROPLASTY  Bilateral   . TUBAL LIGATION  1975    Current Medications: Outpatient Medications Prior to Visit  Medication Sig Dispense Refill  . acetaminophen (TYLENOL) 500 MG tablet Take 500 mg by mouth 2 (two) times daily as needed (pain). Reported on 04/24/2016    . amLODipine (NORVASC) 5 MG tablet Take 1 tablet (5 mg total) by mouth daily. 90 tablet 2  . aspirin EC 81 MG tablet Take 81 mg by mouth daily.    . benazepril (LOTENSIN) 20 MG tablet Take 1 tablet (20 mg total) by mouth daily. 90 tablet 2  . Calcium Carb-Cholecalciferol (CALCIUM 600 + D PO) Take 600 mg by mouth daily.    . Cholecalciferol (VITAMIN D3) 5000 UNITS CAPS Take 5,000 Units by mouth daily.     Marland Kitchen denosumab (PROLIA) 60 MG/ML SOLN injection Inject 60 mg into the skin every 6 (six) months. Administer in upper arm, thigh, or abdomen    . Multiple Vitamin (MULTIVITAMIN WITH MINERALS) TABS tablet Take 1 tablet by mouth daily.    . pravastatin (PRAVACHOL) 20 MG tablet Take 1 tablet (20 mg total) by mouth daily. 90 tablet 2  . triamcinolone lotion (KENALOG) 0.1 % Apply 1 application topically 2 (two) times daily as  needed. (Patient not taking: Reported on 06/05/2016) 120 mL 1   Facility-Administered Medications Prior to Visit  Medication Dose Route Frequency Provider Last Rate Last Dose  . denosumab (PROLIA) injection 60 mg  60 mg Subcutaneous Once Wanda PlumpJose E Paz, MD         Allergies:   Review of patient's allergies indicates no known allergies.   Social History   Social History  . Marital status: Widowed    Spouse name: N/A  . Number of children: 2  . Years of education: N/A   Occupational History  . n/a    Social History Main Topics  . Smoking status: Former Smoker    Packs/day: 1.00    Years: 40.00    Types: Cigarettes    Quit date: 03/19/2004  . Smokeless tobacco: Never Used  . Alcohol use 0.0 oz/week  . Drug use: No  . Sexual activity: Not Asked   Other Topics Concern  . None   Social History Narrative   [place]   1 living son     Family History:  The patient's family history includes Breast cancer in her mother; Cancer in her brother; Diabetes in her son.   ROS:   Please see the history of present illness.    ROS All other systems reviewed and are negative.   PHYSICAL EXAM:   VS:  BP (!) 142/84   Pulse 84   Ht 5\' 6"  (1.676 m)   Wt 157 lb 9.6 oz (71.5 kg)   BMI 25.44 kg/m    GEN: Well nourished, well developed, in no acute distress  HEENT: normal  Neck: no JVD, carotid bruits, or masses Cardiac: RRR; no rubs, or gallops,no edema.  Intact distal pulses bilaterally. 1/6 SM at RUSB to LLSB Respiratory:  clear to auscultation bilaterally, normal work of breathing GI: soft, nontender, nondistended, + BS MS: no deformity or atrophy  Skin: warm and dry, no rash Neuro:  Alert and Oriented x 3, Strength and sensation are intact Psych: euthymic mood, full affect  Wt Readings from Last 3 Encounters:  06/05/16 157 lb 9.6 oz (71.5 kg)  04/24/16 156 lb (70.8 kg)  10/14/15 156 lb 2 oz (70.8 kg)      Studies/Labs Reviewed:   EKG:  EKG is  ordered today.  The ekg ordered today demonstrates NSR at 84bpm with no ST changes  Recent Labs: 04/24/2016: ALT 22; BUN 15; Creatinine, Ser 0.77; Hemoglobin 13.9; Platelets 318.0; Potassium 4.1; Sodium 138   Lipid Panel    Component Value Date/Time   CHOL 223 (H) 04/24/2016 1050   TRIG 96.0 04/24/2016 1050   HDL 78.10 04/24/2016 1050   CHOLHDL 3 04/24/2016 1050   VLDL 19.2 04/24/2016 1050   LDLCALC 126 (H) 04/24/2016 1050    Additional studies/ records that were reviewed today include:  2D echo and office notes from PCP    ASSESSMENT:    1. Essential hypertension   2. Aortic stenosis, moderate   3. Pulmonary HTN (HCC)   4. Dyspnea      PLAN:  In order of problems listed above:  1. HTN - BP controlled on current meds 2. Moderate AS by recent echo - Her mean AV gradient on echo was 21mmHg with peak AV velocity 295 cm/s with  calculated AVA 1.15cm2 c.w moderate AS.   Repeat 2D echo in 1 year to assess for stability.  3. Moderate pulmonary HTN with PASP 45mmHg most likely secondary to COPD/emphysema.  I will get  PFTs to assess severity of emphysema and chest CT angio to rule out chronic PEs.  4. Dyspnea - I suspect this is mostly due to emphysema and remote tobacco use but need to rule out coronary ischemia.  I do not think her SOB is due to AS given that it is on the low end of moderately stenotic at this time.  I will get a Lexiscan myoview to rule out ischemia. Check PFTs with DLCO and Chest CT angio to rule out chronic PE with history of significant long distance air flights.      Medication Adjustments/Labs and Tests Ordered: Current medicines are reviewed at length with the patient today.  Concerns regarding medicines are outlined above.  Medication changes, Labs and Tests ordered today are listed in the Patient Instructions below.  There are no Patient Instructions on file for this visit.   Signed, Dominique Him, MD  06/05/2016 3:55 PM    [address], [place], [place]  [address] Phone: [phone]; Fax: [phone]

## 2016-06-05 NOTE — Addendum Note (Signed)
Addended by: Harland German A on: 06/05/2016 04:31 PM   Modules accepted: Orders

## 2016-06-05 NOTE — Patient Instructions (Signed)
Medication Instructions:  Your physician recommends that you continue on your current medications as directed. Please refer to the Current Medication list given to you today.   Labwork: None  Testing/Procedures: Your physician has recommended that you have a pulmonary function test. Pulmonary Function Tests are a group of tests that measure how well air moves in and out of your lungs.   Dr. Radford Pax recommends you have a CHEST CT.  Your physician has requested that you have a lexiscan myoview. For further information please visit [email or web address]. Please follow instruction sheet, as given.   Your physician has requested that you have an echocardiogram in 1 year.. Echocardiography is a painless test that uses sound waves to create images of your heart. It provides your doctor with information about the size and shape of your heart and how well your heart's chambers and valves are working. This procedure takes approximately one hour. There are no restrictions for this procedure.   Follow-Up: Your physician wants you to follow-up in: 6 months with Dr. Radford Pax. You will receive a reminder letter in the mail two months in advance. If you don't receive a letter, please call our office to schedule the follow-up appointment.   Any Other Special Instructions Will Be Listed Below (If Applicable).     If you need a refill on your cardiac medications before your next appointment, please call your pharmacy.

## 2016-06-06 ENCOUNTER — Ambulatory Visit (INDEPENDENT_AMBULATORY_CARE_PROVIDER_SITE_OTHER)
Admission: RE | Admit: 2016-06-06 | Discharge: 2016-06-06 | Disposition: A | Discharge status: Home / Self Care | Payer: Medicare Other | Source: Ambulatory Visit | Attending: Cardiology | Admitting: Cardiology

## 2016-06-06 DIAGNOSIS — I272 Other secondary pulmonary hypertension: Secondary | ICD-10-CM

## 2016-06-06 DIAGNOSIS — R0600 Dyspnea, unspecified: Secondary | ICD-10-CM

## 2016-06-06 DIAGNOSIS — I10 Essential (primary) hypertension: Secondary | ICD-10-CM | POA: Diagnosis not present

## 2016-06-06 LAB — BASIC METABOLIC PANEL
BUN: 23 mg/dL (ref 7–25)
CHLORIDE: 103 mmol/L (ref 98–110)
CO2: 23 mmol/L (ref 20–31)
Calcium: 9.4 mg/dL (ref 8.6–10.4)
Creat: 1.06 mg/dL — ABNORMAL HIGH (ref 0.60–0.93)
Glucose, Bld: 96 mg/dL (ref 65–99)
POTASSIUM: 4.5 mmol/L (ref 3.5–5.3)
SODIUM: 139 mmol/L (ref 135–146)

## 2016-06-06 MED ORDER — IOPAMIDOL (ISOVUE-370) INJECTION 76%
80.0000 mL | Freq: Once | INTRAVENOUS | Status: AC | PRN
  Administered 2016-06-06: 80 mL via INTRAVENOUS

## 2016-06-12 ENCOUNTER — Telehealth (HOSPITAL_COMMUNITY): Payer: Self-pay | Admitting: *Deleted

## 2016-06-12 ENCOUNTER — Telehealth: Payer: Self-pay

## 2016-06-12 DIAGNOSIS — R9389 Abnormal findings on diagnostic imaging of other specified body structures: Secondary | ICD-10-CM

## 2016-06-12 NOTE — Telephone Encounter (Signed)
-----   Message from Sueanne Margarita, MD sent at 06/06/2016 11:18 AM EDT ----- Chest CT negative for PE.  SHe has evidence of emphysema and ground glass opacities c/w chronic scarring.  THere are 2 pulmonary nodules which are small but recommended followup CT in 1 year for stability.  I suspect her SOB is due to emphysema.  Please refer to Dr. Chase Caller, Nathanial Rancher or [place].

## 2016-06-12 NOTE — Telephone Encounter (Signed)
Patient given detailed instructions per Myocardial Perfusion Study Information Sheet for the test on 06/18/16 at 1000. Patient notified to arrive 15 minutes early and that it is imperative to arrive on time for appointment to keep from having the test rescheduled.  If you need to cancel or reschedule your appointment, please call the office within 24 hours of your appointment. Failure to do so may result in a cancellation of your appointment, and a $50 no show fee. Patient verbalized understanding.Mirella Gueye, Ranae [place]

## 2016-06-12 NOTE — Telephone Encounter (Signed)
Patient given detailed instructions per Myocardial Perfusion Study Information Sheet for the test on 06/18/16 at 0945. Patient notified to arrive 15 minutes early and that it is imperative to arrive on time for appointment to keep from having the test rescheduled.  If you need to cancel or reschedule your appointment, please call the office within 24 hours of your appointment. Failure to do so may result in a cancellation of your appointment, and a $50 no show fee. Patient verbalized understanding.Clennon Nasca, Ranae Palms

## 2016-06-12 NOTE — Telephone Encounter (Signed)
Informed patient of results and verbal understanding expressed.  Pulmonary referral placed. Patient agrees with treatment plan. 

## 2016-06-14 ENCOUNTER — Ambulatory Visit (INDEPENDENT_AMBULATORY_CARE_PROVIDER_SITE_OTHER): Payer: Medicare Other | Admitting: Pulmonary Disease

## 2016-06-14 ENCOUNTER — Telehealth: Payer: Self-pay | Admitting: Gastroenterology

## 2016-06-14 ENCOUNTER — Encounter: Payer: Self-pay | Admitting: Pulmonary Disease

## 2016-06-14 VITALS — BP 132/68 | HR 76 | Ht 66.0 in | Wt 159.0 lb

## 2016-06-14 DIAGNOSIS — R918 Other nonspecific abnormal finding of lung field: Secondary | ICD-10-CM

## 2016-06-14 DIAGNOSIS — J438 Other emphysema: Secondary | ICD-10-CM | POA: Diagnosis not present

## 2016-06-14 DIAGNOSIS — R911 Solitary pulmonary nodule: Secondary | ICD-10-CM | POA: Diagnosis not present

## 2016-06-14 NOTE — Progress Notes (Signed)
   Subjective:    Patient ID: Dominique [name], female    DOB: 1941-05-25, 75 y.o.   MRN: [id number]  HPI    Review of Systems  Constitutional: Negative.  Negative for fever and unexpected weight change.  HENT: Negative.  Negative for congestion, dental problem, ear pain, nosebleeds, postnasal drip, rhinorrhea, sinus pressure, sneezing, sore throat and trouble swallowing.   Eyes: Negative.  Negative for redness and itching.  Respiratory: Positive for shortness of breath. Negative for cough, chest tightness and wheezing.   Cardiovascular: Positive for leg swelling. Negative for palpitations.  Gastrointestinal: Negative.  Negative for nausea and vomiting.  Endocrine: Negative.   Genitourinary: Negative.  Negative for dysuria.  Musculoskeletal: Negative.  Negative for joint swelling.  Skin: Negative.  Negative for rash.  Allergic/Immunologic: Negative.   Neurological: Positive for dizziness. Negative for headaches.  Hematological: Bruises/bleeds easily.  Psychiatric/Behavioral: Negative.  Negative for dysphoric mood. The patient is not nervous/anxious.        Objective:   Physical Exam        Assessment & Plan:

## 2016-06-14 NOTE — Patient Instructions (Signed)
  It was a pleasure taking care of you today!  We will order a chest CT scan without contrast February 1st week 2018.  Follow-up after the chest CT scan is done.  Please let us know if you have issues with your  breathing. At that point, you may end up needing medicines for your COPD.

## 2016-06-14 NOTE — Progress Notes (Signed)
Subjective:    Patient ID: Dominique [name], female    DOB: 1941-08-19, 75 y.o.   MRN: [id number]  HPI   ROV 06/14/16 Patient is here urgently for abnormal chest CT scan. 50-pack-year smoking history, quit when she was 75 years old. She has been diagnosed  with COPD but is not on meds. Not on o2. (-) DVT/PE. (-) CA. She sees Dr. Radford Pax for heart murmur. She ended up doing chest CTA which was (-) for PE but had other abnormalities. Chest CT scan showed right pulmonary artery dilated at 2.9 cm. The ascending aorta was also dilated. She had a groundglass like/reticular infiltrate over at the left upper lung, posterior segment with some tenting. She also had a 3.3 mm nodule in the anterior right middle lobe and a 4.3 mm nodule in the right lower lobe, subpleural. Pt saw Dr. Melvyn Novas for SOB in 05/2015 with no follow up done.    Review of Systems  Constitutional: Negative.   HENT: Negative.   Eyes: Negative.   Respiratory: Positive for shortness of breath.   Cardiovascular: Negative.   Gastrointestinal: Negative.   Endocrine: Negative.   Genitourinary: Negative.   Musculoskeletal: Negative.   Skin: Negative.   Allergic/Immunologic: Negative.   Neurological: Negative.   Hematological: Negative.   Psychiatric/Behavioral: Negative.   All other systems reviewed and are negative.  Past Medical History:  Diagnosis Date  . Aortic stenosis, moderate    echo 04/2016  . Cardiac arrhythmia due to congenital heart disease   . Emphysema lung ([facility])   . History of blood transfusion 1971   with C-section complication  . Hypertension   . Pulmonary HTN ([facility]) 06/05/2016   Moderate with PASP 83mmHg by echo 04/2016  . Sacral fracture ([facility])   . UTI (lower urinary tract infection)      Family History  Problem Relation Age of Onset  . Breast cancer Mother     breast  . Diabetes Son     type 1  . Cancer Brother     lung  . Colon cancer Neg Hx   . CAD Neg Hx      Past Surgical History:  Procedure  Laterality Date  . APPENDECTOMY  1951  . CATARACT EXTRACTION    . CESAREAN SECTION     1971  . JOINT REPLACEMENT    . LAMINECTOMY    . TOTAL HIP ARTHROPLASTY Bilateral   . TUBAL LIGATION  1975    Social History   Social History  . Marital status: Widowed    Spouse name: N/A  . Number of children: 2  . Years of education: N/A   Occupational History  . n/a    Social History Main Topics  . Smoking status: Former Smoker    Packs/day: 1.00    Years: 40.00    Types: Cigarettes    Quit date: 03/19/2004  . Smokeless tobacco: Never Used  . Alcohol use 0.0 oz/week  . Drug use: No  . Sexual activity: Not on file   Other Topics Concern  . Not on file   Social History Narrative   From [place]   1 living son     No Known Allergies   Outpatient Medications Prior to Visit  Medication Sig Dispense Refill  . acetaminophen (TYLENOL) 500 MG tablet Take 500 mg by mouth 2 (two) times daily as needed (pain). Reported on 04/24/2016    . amLODipine (NORVASC) 5 MG tablet Take 1 tablet (5 mg total) by mouth  daily. 90 tablet 2  . aspirin EC 81 MG tablet Take 81 mg by mouth daily.    . benazepril (LOTENSIN) 20 MG tablet Take 1 tablet (20 mg total) by mouth daily. 90 tablet 2  . Calcium Carb-Cholecalciferol (CALCIUM 600 + D PO) Take 600 mg by mouth daily.    . Cholecalciferol (VITAMIN D3) 5000 UNITS CAPS Take 5,000 Units by mouth daily.     Marland Kitchen denosumab (PROLIA) 60 MG/ML SOLN injection Inject 60 mg into the skin every 6 (six) months. Administer in upper arm, thigh, or abdomen    . Multiple Vitamin (MULTIVITAMIN WITH MINERALS) TABS tablet Take 1 tablet by mouth daily.    . pravastatin (PRAVACHOL) 20 MG tablet Take 1 tablet (20 mg total) by mouth daily. 90 tablet 2   Facility-Administered Medications Prior to Visit  Medication Dose Route Frequency Provider Last Rate Last Dose  . denosumab (PROLIA) injection 60 mg  60 mg Subcutaneous Once Colon Branch, MD       No orders of the defined types  were placed in this encounter.       Objective:   Physical Exam  Vitals:  Vitals:   06/14/16 1533  BP: 132/68  Pulse: 76  SpO2: 98%  Weight: 72.1 kg (159 lb)  Height: 5\' 6"  (1.676 m)    Constitutional/General:  Pleasant, well-nourished, well-developed, not in any distress,  Comfortably seating.  Well kempt  Body mass index is 25.66 kg/m. Wt Readings from Last 3 Encounters:  06/14/16 72.1 kg (159 lb)  06/05/16 71.5 kg (157 lb 9.6 oz)  04/24/16 70.8 kg (156 lb)    HEENT: Pupils equal and reactive to light and accommodation. Anicteric sclerae. Normal nasal mucosa.   No oral  lesions,  mouth clear,  oropharynx clear, no postnasal drip. (-) Oral thrush. No dental caries.  Airway - Mallampati class III  Neck: No masses. Midline trachea. No JVD, (-) LAD. (-) bruits appreciated.  Respiratory/Chest: Grossly normal chest. (-) deformity. (-) Accessory muscle use.  Symmetric expansion. (-) Tenderness on palpation.  Resonant on percussion.  Diminished BS on both lower lung zones. (-)  crackles, rhonchi. Occasional wheezing (-) egophony  Cardiovascular: Regular rate and  rhythm, heart sounds normal, no murmur or gallops, no peripheral edema  Gastrointestinal:  Normal bowel sounds. Soft, non-tender. No hepatosplenomegaly.  (-) masses.   Musculoskeletal:  Normal muscle tone. Normal gait.   Extremities: Grossly normal. (-) clubbing, cyanosis.  (-) edema  Skin: (-) rash,lesions seen.   Neurological/Psychiatric : alert, oriented to time, place, person. Normal mood and affect          Assessment & Plan:  Multiple lung nodules Pt is here for incidental lung nodules.  67 PY smoking history, quit when she was 75 yo.  Chest ct scan in 04/2016 showed right pulmonary artery dilated at 2.9 cm. The ascending aorta was also dilated. She had a groundglass like/reticular infiltrate over at the left upper lung, posterior segment with some tenting. She also had a 3.3 mm nodule in  the anterior right middle lobe and a 4.3 mm nodule in the right lower lobe, subpleural.   I personally reviewed previous images (Chest Xray, Chest Ct scan) done on this patient. I reviewed the reports on the images as well.  I showed the images to pt as well.   The nodules are too small to be biopsied. I suespect the nodules could be scars/chronic inflammation, but we can NOT R/O malignancy given her smoking history, her  age, and family history of CA.   I am actually more concerned about the reticular infiltrate in LUL which can look like a smoldering malignancy like a BAC.  The infiltrate however has tenting which suggests chronicity.   Plan : 1. We decided to get a plain chest ct scan 6 mos from baseline > Feb 2018. After that, she will need a CTA in 6 mos to assess the dilated aorta and R PA. She will need yearly CTA for aorta and R PA.  2. Hold of on any bx as nodules are too small. Hold off on PET as lesions are too small as well.    Emphysema lung LIkely with mod-severe COPD. She gets SOB but not too bad. Can still do ADLs w/o much issues. She wants to hold off on meds. Needs vaccines to be UTD.  Dr. Radford Pax ordered PFT and perfusion scan for next week.   Return to clinic after chest ct scan is done next year.   Monica Becton, MD 06/15/2016, 4:56 AM [facility] Pager [phone] After 3 pm or if no answer, call [phone]

## 2016-06-15 ENCOUNTER — Encounter: Payer: Self-pay | Admitting: Gastroenterology

## 2016-06-15 DIAGNOSIS — R918 Other nonspecific abnormal finding of lung field: Secondary | ICD-10-CM | POA: Insufficient documentation

## 2016-06-15 NOTE — Assessment & Plan Note (Addendum)
LIkely with mod-severe COPD. She gets SOB but not too bad. Can still do ADLs w/o much issues. She wants to hold off on meds. Needs vaccines to be UTD.  Dr. Radford Pax ordered PFT and perfusion scan for next week.

## 2016-06-15 NOTE — Assessment & Plan Note (Signed)
Pt is here for incidental lung nodules.  24 PY smoking history, quit when she was 75 yo.  Chest ct scan in 04/2016 showed right pulmonary artery dilated at 2.9 cm. The ascending aorta was also dilated. She had a groundglass like/reticular infiltrate over at the left upper lung, posterior segment with some tenting. She also had a 3.3 mm nodule in the anterior right middle lobe and a 4.3 mm nodule in the right lower lobe, subpleural.   I personally reviewed previous images (Chest Xray, Chest Ct scan) done on this patient. I reviewed the reports on the images as well.  I showed the images to pt as well.   The nodules are too small to be biopsied. I suespect the nodules could be scars/chronic inflammation, but we can NOT R/O malignancy given her smoking history, her age, and family history of CA.   I am actually more concerned about the reticular infiltrate in LUL which can look like a smoldering malignancy like a BAC.  The infiltrate however has tenting which suggests chronicity.   Plan : 1. We decided to get a plain chest ct scan 6 mos from baseline > Feb 2018. After that, she will need a CTA in 6 mos to assess the dilated aorta and R PA. She will need yearly CTA for aorta and R PA.  2. Hold of on any bx as nodules are too small. Hold off on PET as lesions are too small as well.

## 2016-06-18 ENCOUNTER — Ambulatory Visit (HOSPITAL_COMMUNITY)
Admission: RE | Admit: 2016-06-18 | Discharge: 2016-06-18 | Disposition: A | Discharge status: Home / Self Care | Payer: Medicare Other | Source: Ambulatory Visit | Attending: Cardiology | Admitting: Cardiology

## 2016-06-18 ENCOUNTER — Ambulatory Visit (HOSPITAL_BASED_OUTPATIENT_CLINIC_OR_DEPARTMENT_OTHER): Payer: Medicare Other

## 2016-06-18 DIAGNOSIS — R0600 Dyspnea, unspecified: Secondary | ICD-10-CM | POA: Diagnosis not present

## 2016-06-18 DIAGNOSIS — I272 Other secondary pulmonary hypertension: Secondary | ICD-10-CM | POA: Insufficient documentation

## 2016-06-18 DIAGNOSIS — R0602 Shortness of breath: Secondary | ICD-10-CM

## 2016-06-18 LAB — PULMONARY FUNCTION TEST
DL/VA % pred: 60 %
DL/VA: 2.99 ml/min/mmHg/L
DLCO unc % pred: 41 %
DLCO unc: 10.71 ml/min/mmHg
FEF 25-75 Post: 0.7 L/sec
FEF 25-75 Pre: 0.52 L/sec
FEF2575-%CHANGE-POST: 34 %
FEF2575-%PRED-PRE: 31 %
FEF2575-%Pred-Post: 42 %
FEV1-%CHANGE-POST: 13 %
FEV1-%PRED-POST: 57 %
FEV1-%PRED-PRE: 51 %
FEV1-PRE: 1.11 L
FEV1-Post: 1.25 L
FEV1FVC-%CHANGE-POST: 0 %
FEV1FVC-%Pred-Pre: 70 %
FEV6-%Change-Post: 12 %
FEV6-%PRED-PRE: 73 %
FEV6-%Pred-Post: 81 %
FEV6-POST: 2.25 L
FEV6-PRE: 2.01 L
FEV6FVC-%Change-Post: -1 %
FEV6FVC-%PRED-POST: 101 %
FEV6FVC-%PRED-PRE: 102 %
FVC-%CHANGE-POST: 12 %
FVC-%PRED-PRE: 72 %
FVC-%Pred-Post: 81 %
FVC-POST: 2.35 L
FVC-PRE: 2.08 L
POST FEV6/FVC RATIO: 96 %
PRE FEV6/FVC RATIO: 97 %
Post FEV1/FVC ratio: 53 %
Pre FEV1/FVC ratio: 53 %
RV % PRED: 99 %
RV: 2.34 L
TLC % PRED: 90 %
TLC: 4.69 L

## 2016-06-18 LAB — MYOCARDIAL PERFUSION IMAGING
CHL CUP NUCLEAR SRS: 17
CHL CUP RESTING HR STRESS: 71 {beats}/min
CSEPPHR: 112 {beats}/min
LV sys vol: 11 mL
LVDIAVOL: 56 mL (ref 46–106)
NUC STRESS TID: 0.94
RATE: 0.2
SDS: 3
SSS: 20

## 2016-06-18 MED ORDER — ALBUTEROL SULFATE (2.5 MG/3ML) 0.083% IN NEBU
2.5000 mg | INHALATION_SOLUTION | Freq: Once | RESPIRATORY_TRACT | Status: AC
  Administered 2016-06-18: 2.5 mg via RESPIRATORY_TRACT

## 2016-06-18 MED ORDER — TECHNETIUM TC 99M TETROFOSMIN IV KIT
32.6000 | PACK | Freq: Once | INTRAVENOUS | Status: AC | PRN
  Administered 2016-06-18: 33 via INTRAVENOUS
  Filled 2016-06-18: qty 33

## 2016-06-18 MED ORDER — TECHNETIUM TC 99M TETROFOSMIN IV KIT
10.9000 | PACK | Freq: Once | INTRAVENOUS | Status: AC | PRN
  Administered 2016-06-18: 10.9 via INTRAVENOUS
  Filled 2016-06-18: qty 11

## 2016-06-18 MED ORDER — REGADENOSON 0.4 MG/5ML IV SOLN
0.4000 mg | Freq: Once | INTRAVENOUS | Status: AC
  Administered 2016-06-18: 0.4 mg via INTRAVENOUS

## 2016-06-19 ENCOUNTER — Ambulatory Visit: Payer: BLUE CROSS/BLUE SHIELD

## 2016-06-20 ENCOUNTER — Other Ambulatory Visit: Payer: Self-pay | Admitting: Pulmonary Disease

## 2016-06-20 MED ORDER — ALBUTEROL SULFATE HFA 108 (90 BASE) MCG/ACT IN AERS: 2.0000 | INHALATION_SPRAY | Freq: Four times a day (QID) | RESPIRATORY_TRACT | 2 refills | Status: DC | PRN

## 2016-07-13 ENCOUNTER — Ambulatory Visit (AMBULATORY_SURGERY_CENTER): Payer: Self-pay

## 2016-07-13 VITALS — Ht 65.5 in | Wt 157.0 lb

## 2016-07-13 DIAGNOSIS — Z860100 Personal history of colon polyps, unspecified: Secondary | ICD-10-CM

## 2016-07-13 MED ORDER — SUPREP BOWEL PREP KIT 17.5-3.13-1.6 GM/177ML PO SOLN: 1.0000 | Freq: Once | ORAL | 0 refills | Status: AC

## 2016-07-13 NOTE — Progress Notes (Signed)
No allergies to eggs or soy No past problems with anesthesia No diet meds No home oxygen  Has email and internet; declined emmi 

## 2016-07-16 ENCOUNTER — Other Ambulatory Visit: Payer: Self-pay | Admitting: Internal Medicine

## 2016-07-27 ENCOUNTER — Ambulatory Visit (AMBULATORY_SURGERY_CENTER): Payer: Medicare Other | Admitting: Gastroenterology

## 2016-07-27 ENCOUNTER — Encounter: Payer: Self-pay | Admitting: Gastroenterology

## 2016-07-27 VITALS — BP 90/43 | HR 70 | Temp 98.4°F | Resp 15 | Ht 65.5 in | Wt 157.0 lb

## 2016-07-27 DIAGNOSIS — Z86010 Personal history of colonic polyps: Secondary | ICD-10-CM | POA: Diagnosis not present

## 2016-07-27 DIAGNOSIS — D120 Benign neoplasm of cecum: Secondary | ICD-10-CM

## 2016-07-27 DIAGNOSIS — I10 Essential (primary) hypertension: Secondary | ICD-10-CM | POA: Diagnosis not present

## 2016-07-27 DIAGNOSIS — I350 Nonrheumatic aortic (valve) stenosis: Secondary | ICD-10-CM | POA: Diagnosis not present

## 2016-07-27 MED ORDER — SODIUM CHLORIDE 0.9 % IV SOLN: 500.0000 mL | INTRAVENOUS | Status: DC

## 2016-07-27 NOTE — Patient Instructions (Signed)
Severe diverticulosis, internal hemorrhoids and 1 polyp found.  Refer to [facility] for a repeat colonoscopy with polypectomy.    YOU HAD AN ENDOSCOPIC PROCEDURE TODAY AT [facility]:   Refer to the procedure report that was given to you for any specific questions about what was found during the examination.  If the procedure report does not answer your questions, please call your gastroenterologist to clarify.  If you requested that your care partner not be given the details of your procedure findings, then the procedure report has been included in a sealed envelope for you to review at your convenience later.  YOU SHOULD EXPECT: Some feelings of bloating in the abdomen. Passage of more gas than usual.  Walking can help get rid of the air that was put into your GI tract during the procedure and reduce the bloating. If you had a lower endoscopy (such as a colonoscopy or flexible sigmoidoscopy) you may notice spotting of blood in your stool or on the toilet paper. If you underwent a bowel prep for your procedure, you may not have a normal bowel movement for a few days.  Please Note:  You might notice some irritation and congestion in your nose or some drainage.  This is from the oxygen used during your procedure.  There is no need for concern and it should clear up in a day or so.  SYMPTOMS TO REPORT IMMEDIATELY:   Following lower endoscopy (colonoscopy or flexible sigmoidoscopy):  Excessive amounts of blood in the stool  Significant tenderness or worsening of abdominal pains  Swelling of the abdomen that is new, acute  Fever of 100F or higher   For urgent or emergent issues, a gastroenterologist can be reached at any hour by calling [phone].   DIET:  We do recommend a small meal at first, but then you may proceed to your regular diet.  Drink plenty of fluids but you should avoid alcoholic beverages for 24 hours.  ACTIVITY:  You should plan to take it easy for the rest of  today and you should NOT DRIVE or use heavy machinery until tomorrow (because of the sedation medicines used during the test).    FOLLOW UP: Our staff will call the number listed on your records the next business day following your procedure to check on you and address any questions or concerns that you may have regarding the information given to you following your procedure. If we do not reach you, we will leave a message.  However, if you are feeling well and you are not experiencing any problems, there is no need to return our call.  We will assume that you have returned to your regular daily activities without incident.  If any biopsies were taken you will be contacted by phone or by letter within the next 1-3 weeks.  Please call us at [phone] if you have not heard about the biopsies in 3 weeks.    SIGNATURES/CONFIDENTIALITY: You and/or your care partner have signed paperwork which will be entered into your electronic medical record.  These signatures attest to the fact that that the information above on your After Visit Summary has been reviewed and is understood.  Full responsibility of the confidentiality of this discharge information lies with you and/or your care-partner.

## 2016-07-27 NOTE — Progress Notes (Signed)
Report given to PACU RN, vss 

## 2016-07-27 NOTE — Progress Notes (Signed)
No egg or soy allergy known to patient  No issues with past sedation with any surgeries  or procedures, no intubation problems -- pt states with c section she was sick post op- No diet pills per patient No home 02 use per patient  No blood thinners per patient  Pt denies issues with constipation  No A fib or A flutter  Pt states she is a hard IV stick

## 2016-07-27 NOTE — Progress Notes (Signed)
Called to room to assist during endoscopic procedure.  Patient ID and intended procedure confirmed with present staff. Received instructions for my participation in the procedure from the performing physician.  

## 2016-07-27 NOTE — Op Note (Signed)
[facility] Patient Name: Dominique [name] Procedure Date: 07/27/2016 11:20 AM MRN: [id number] Endoscopist: Mauri Pole , MD Age: 75 Referring MD:  Date of Birth: 12/12/1940 Gender: Female Account #: [id number] Procedure:                Colonoscopy Indications:              High risk colon cancer surveillance: Personal                            history of colonic polyps, Surveillance: Personal                            history of adenomatous polyps on last colonoscopy >                            5 years ago Medicines:                Monitored Anesthesia Care Procedure:                Pre-Anesthesia Assessment:                           - Prior to the procedure, a History and Physical                            was performed, and patient medications and                            allergies were reviewed. The patient's tolerance of                            previous anesthesia was also reviewed. The risks                            and benefits of the procedure and the sedation                            options and risks were discussed with the patient.                            All questions were answered, and informed consent                            was obtained. Prior Anticoagulants: The patient has                            taken no previous anticoagulant or antiplatelet                            agents. ASA Grade Assessment: III - A patient with                            severe systemic disease. After reviewing the risks  and benefits, the patient was deemed in                            satisfactory condition to undergo the procedure.                           After obtaining informed consent, the colonoscope                            was passed under direct vision. Throughout the                            procedure, the patient's blood pressure, pulse, and                            oxygen saturations were monitored  continuously. The                            Model CF-HQ190L [id number]) scope was introduced                            through the anus and advanced to the the terminal                            ileum, with identification of the appendiceal                            orifice and IC valve. The Model PCF-H190DL                            [id number]) scope was introduced through the and                            advanced to the. The colonoscopy was somewhat                            difficult due to multiple diverticula in the colon,                            restricted mobility of the colon and a tortuous                            colon. Successful completion of the procedure was                            aided by using manual pressure and withdrawing the                            scope and replacing with the pediatric colonoscope.                            The patient tolerated the procedure well. The  quality of the bowel preparation was adequate. The                            ileocecal valve, appendiceal orifice, and rectum                            were photographed. Scope In: 11:31:07 AM Scope Out: 12:11:39 PM Scope Withdrawal Time: 0 hours 17 minutes 54 seconds  Total Procedure Duration: 0 hours 40 minutes 32 seconds  Findings:                 The perianal and digital rectal examinations were                            normal.                           Multiple small and large-mouthed diverticula were                            found in the sigmoid colon, descending colon,                            transverse colon and ascending colon. There was                            narrowing of the colon in association with the                            diverticular opening. There was evidence of                            diverticular spasm. Peri-diverticular erythema was                            seen. There was evidence of an impacted                             diverticulum.                           A 7-8 mm polyp was found in the cecum with ill                            defined margins and central scarring. The polyp was                            carpet-like. Area was successfully injected with 3                            mL saline with spot+ saline for lesion assessment,                            but the lesion could not be lifted adequately.The  center of most of the polypoid tisse was adherent                            and couldnt be lifted. Polypectomy was not                            performed. Estimated blood loss was minimal.                           Non-bleeding internal hemorrhoids were found during                            retroflexion. The hemorrhoids were medium-sized. Complications:            No immediate complications. Estimated Blood Loss:     Estimated blood loss was minimal. Impression:               - Severe diverticulosis in the sigmoid colon, in                            the descending colon, in the transverse colon and                            in the ascending colon. There was narrowing of the                            colon in association with the diverticular opening.                            There was evidence of diverticular spasm.                            Peri-diverticular erythema was seen. There was                            evidence of an impacted diverticulum.                           - One 7-23mm mm polyp in the cecum. Injected with                            spot.                           - Non-bleeding internal hemorrhoids.                           - No specimens collected. Recommendation:           - Patient has a contact number available for                            emergencies. The signs and symptoms of potential                            delayed complications were  discussed with the                            patient. Return to normal activities  tomorrow.                            Written discharge instructions were provided to the                            patient.                           - Resume previous diet.                           - Continue present medications.                           - Refer to [facility] for repeat colonoscopy with                            polypectomy. Mauri Pole, MD 07/27/2016 12:22:07 PM This report has been signed electronically.

## 2016-07-30 ENCOUNTER — Telehealth: Payer: Self-pay

## 2016-07-30 NOTE — Telephone Encounter (Signed)
  Follow up Call-  Call back number 07/27/2016  Post procedure Call Back phone  # [phone]  Permission to leave phone message Yes    Patient asked if Dr. Woodward Ku office was going to schedule her referral to [facility]. I told the patient that she should be hearing from the office in the next few days. If she doesn't hear something, I told the patient to call the office to follow up with referral.    Patient questions:  Do you have a fever, pain , or abdominal swelling? No. Pain Score  0 *  Have you tolerated food without any problems? Yes.    Have you been able to return to your normal activities? Yes.    Do you have any questions about your discharge instructions: Diet   No. Medications  No. Follow up visit  No.  Do you have questions or concerns about your Care? No.  Actions: * If pain score is 4 or above: No action needed, pain <4.

## 2016-08-09 ENCOUNTER — Telehealth: Payer: Self-pay | Admitting: *Deleted

## 2016-08-09 NOTE — Telephone Encounter (Signed)
Dr. Silverio Decamp,  Would you like a recall entered for this patient?  Dominique [name]

## 2016-08-14 NOTE — Telephone Encounter (Signed)
We can have her in for 12 months recall, but the final recommendation depends based on after the polypectomy. Is there a way we can check when patient is schedule for that at [facility]?Marland Kitchen Thank you

## 2016-08-20 NOTE — Telephone Encounter (Signed)
Checked with [facility]. They did not find a referral.  Faxed an new referral form. Records faxed too even though they are in EPIC in hopes of expediting this.

## 2016-08-27 DIAGNOSIS — H33321 Round hole, right eye: Secondary | ICD-10-CM | POA: Diagnosis not present

## 2016-08-27 DIAGNOSIS — H10433 Chronic follicular conjunctivitis, bilateral: Secondary | ICD-10-CM | POA: Diagnosis not present

## 2016-09-17 ENCOUNTER — Telehealth: Payer: Self-pay | Admitting: Gastroenterology

## 2016-09-17 NOTE — Telephone Encounter (Signed)
She has been referred to [facility] Dr Arsenio Loader for the removal of the polyp. It is easier for the patient to go to [facility]. Dr Arsenio Loader does endoscopic mucosal resection. Patient wants to be certain you are okay with this.

## 2016-09-17 NOTE — Telephone Encounter (Signed)
Patient is aware 

## 2016-09-17 NOTE — Telephone Encounter (Signed)
Yes. Thanks 

## 2016-10-01 DIAGNOSIS — L814 Other melanin hyperpigmentation: Secondary | ICD-10-CM | POA: Diagnosis not present

## 2016-10-01 DIAGNOSIS — L821 Other seborrheic keratosis: Secondary | ICD-10-CM | POA: Diagnosis not present

## 2016-10-01 DIAGNOSIS — D1801 Hemangioma of skin and subcutaneous tissue: Secondary | ICD-10-CM | POA: Diagnosis not present

## 2016-10-01 DIAGNOSIS — Z23 Encounter for immunization: Secondary | ICD-10-CM | POA: Diagnosis not present

## 2016-10-03 DIAGNOSIS — K635 Polyp of colon: Secondary | ICD-10-CM | POA: Diagnosis not present

## 2016-10-03 DIAGNOSIS — Z8719 Personal history of other diseases of the digestive system: Secondary | ICD-10-CM | POA: Diagnosis not present

## 2016-10-03 DIAGNOSIS — Z87891 Personal history of nicotine dependence: Secondary | ICD-10-CM | POA: Diagnosis not present

## 2016-10-03 DIAGNOSIS — I10 Essential (primary) hypertension: Secondary | ICD-10-CM | POA: Diagnosis not present

## 2016-10-03 DIAGNOSIS — M810 Age-related osteoporosis without current pathological fracture: Secondary | ICD-10-CM | POA: Diagnosis not present

## 2016-10-03 DIAGNOSIS — Z23 Encounter for immunization: Secondary | ICD-10-CM | POA: Diagnosis not present

## 2016-10-03 DIAGNOSIS — D120 Benign neoplasm of cecum: Secondary | ICD-10-CM | POA: Diagnosis not present

## 2016-10-15 ENCOUNTER — Ambulatory Visit (INDEPENDENT_AMBULATORY_CARE_PROVIDER_SITE_OTHER): Payer: Medicare Other | Admitting: Internal Medicine

## 2016-10-15 ENCOUNTER — Encounter: Payer: Self-pay | Admitting: Internal Medicine

## 2016-10-15 VITALS — BP 112/76 | HR 78 | Temp 98.1°F | Resp 12 | Ht 66.0 in | Wt 157.5 lb

## 2016-10-15 DIAGNOSIS — I10 Essential (primary) hypertension: Secondary | ICD-10-CM

## 2016-10-15 DIAGNOSIS — I350 Nonrheumatic aortic (valve) stenosis: Secondary | ICD-10-CM | POA: Diagnosis not present

## 2016-10-15 DIAGNOSIS — J438 Other emphysema: Secondary | ICD-10-CM | POA: Diagnosis not present

## 2016-10-15 LAB — BASIC METABOLIC PANEL
BUN: 16 mg/dL (ref 6–23)
CHLORIDE: 104 meq/L (ref 96–112)
CO2: 23 mEq/L (ref 19–32)
CREATININE: 0.69 mg/dL (ref 0.40–1.20)
Calcium: 9.1 mg/dL (ref 8.4–10.5)
GFR: 87.81 mL/min (ref >60.00)
Glucose, Bld: 80 mg/dL (ref 70–99)
Potassium: 4.5 mEq/L (ref 3.5–5.1)
Sodium: 140 mEq/L (ref 135–145)

## 2016-10-15 NOTE — Patient Instructions (Signed)
GO TO THE LAB : Get the blood work     GO TO THE FRONT DESK Schedule your next appointment for a  Physical exam (03-2017)  You need a PROLIA injection early next year

## 2016-10-15 NOTE — Progress Notes (Signed)
Pre visit review using our clinic review tool, if applicable. No additional management support is needed unless otherwise documented below in the visit note. 

## 2016-10-15 NOTE — Progress Notes (Signed)
Subjective:    Patient ID: Dominique [name], female    DOB: 06-Jul-1941, 75 y.o.   MRN: [id number]  DOS:  10/15/2016 Type of visit - description : rov Interval history: Good medication compliance. Medication list reviewed. Labs reviewed, due for a BMP Since the last time she was here she had multiple tests, saw cardiology and pulmonology. Notes reviewed. Also had a colonoscopy.   Review of Systems Denies fever chills. No chest pain. DOE at baseline. Very rarely has cough.  Past Medical History:  Diagnosis Date  . Aortic stenosis, moderate    echo 04/2016  . Cardiac arrhythmia due to congenital heart disease   . Emphysema lung ([place])   . History of blood transfusion 1971   with C-section complication  . Hyperlipidemia   . Hypertension   . Pulmonary HTN 06/05/2016   Moderate with PASP 64mmHg by echo 04/2016  . Sacral fracture ([place])   . UTI (lower urinary tract infection)     Past Surgical History:  Procedure Laterality Date  . APPENDECTOMY  1951  . CATARACT EXTRACTION    . CESAREAN SECTION     1971  . JOINT REPLACEMENT  2016   sacroplasty  . LAMINECTOMY    . TOTAL HIP ARTHROPLASTY Bilateral   . TUBAL LIGATION  1975    Social History   Social History  . Marital status: Widowed    Spouse name: N/A  . Number of children: 2  . Years of education: N/A   Occupational History  . n/a    Social History Main Topics  . Smoking status: Former Smoker    Packs/day: 1.00    Years: 40.00    Types: Cigarettes    Quit date: 03/19/2004  . Smokeless tobacco: Never Used  . Alcohol use 1.8 - 2.4 oz/week    3 - 4 Glasses of wine per week     Comment: socially  . Drug use: No  . Sexual activity: Not on file   Other Topics Concern  . Not on file   Social History Narrative   From [place]   1 living son      Allergies as of 10/15/2016   No Known Allergies     Medication List       Accurate as of 10/15/16 10:55 AM. Always use your most recent med list.            acetaminophen 500 MG tablet Commonly known as:  TYLENOL Take 500 mg by mouth 2 (two) times daily as needed (pain). Reported on 04/24/2016   albuterol 108 (90 Base) MCG/ACT inhaler Commonly known as:  PROVENTIL HFA;VENTOLIN HFA Inhale 2 puffs into the lungs every 6 (six) hours as needed for wheezing or shortness of breath.   amLODipine 5 MG tablet Commonly known as:  NORVASC Take 1 tablet (5 mg total) by mouth daily.   aspirin EC 81 MG tablet Take 81 mg by mouth daily.   benazepril 20 MG tablet Commonly known as:  LOTENSIN Take 1 tablet (20 mg total) by mouth daily.   CALCIUM 600 + D PO Take 600 mg by mouth daily.   denosumab 60 MG/ML Soln injection Commonly known as:  PROLIA Inject 60 mg into the skin every 6 (six) months. Administer in upper arm, thigh, or abdomen   multivitamin with minerals Tabs tablet Take 1 tablet by mouth daily.   pravastatin 20 MG tablet Commonly known as:  PRAVACHOL Take 1 tablet (20 mg total) by mouth daily.  Vitamin D3 5000 units Caps Take 5,000 Units by mouth daily.          Objective:   Physical Exam BP 112/76 (BP Location: Left Arm, Patient Position: Sitting, Cuff Size: Small)   Pulse 78   Temp 98.1 F (36.7 C) (Oral)   Resp 12   Ht 5\' 6"  (1.676 m)   Wt 157 lb 8 oz (71.4 kg)   SpO2 97%   BMI 25.42 kg/m  General:   Well developed, well nourished . NAD.  HEENT:  Normocephalic . Face symmetric, atraumatic Lungs:  CTA B Normal respiratory effort, no intercostal retractions, no accessory muscle use. Heart: RRR,  + systolic murmur.  No pretibial edema bilaterally  Skin: Not pale. Not jaundice Neurologic:  alert & oriented X3.  Speech normal, gait appropriate for age and unassisted Psych--  Cognition and judgment appear intact.  Cooperative with normal attention span and concentration.  Behavior appropriate. No anxious or depressed appearing.      Assessment & Plan:   Assessment HTN Hyperlipidemia PULM: --Former  smoker, quit 2006 --H/o Emphysema, remote -- CT chest angio  05-2016---> pulm nodule x2,reticular infiltrate (pulm rx redo CT chest), enlarged R P.A. and Ao (pulm rec CTA q year) CV: ---H/o murmur (since childhood) --ECHO 7-17, AoS, saw cards, recheck 1 year --DOE: (-) myoview 28-017, CT angio chest8-2017:  no PE (see above) MSK --DJD --Sacral Fx, sacral insufficiency, s/p sacroplasty (radiology) 07-2015 --H/o osteoporosis : took fosamax while in [place], Fosamax rx again by ortho after sacral Fx 07-2015, never took it, s/p #1 prolia 11-14-2015 --T score 08-2015 >> normal (likely normal d/t DJD?) H/o vit D def: Normal level 09-2015   PLAN HTN: Controlled with amlodipine and Lotensin. Check a BMP. COPD, abnormal CT chest: Has two nodules and reticular pattern infiltrate, follow-up now by pulmonology. They plan to check a CT in few months. Ao Stenosis: Saw cardiology, Myoview negative, they rx echo next year Osteoporosis: Due for a prolia at this office by January 2018. Had a colonoscopy in [place], subsequently referred to [facility] as she had a flat polyp. RTC 03/2017, CPX

## 2016-10-16 NOTE — Assessment & Plan Note (Addendum)
HTN: Controlled with amlodipine and Lotensin. Check a BMP. COPD, abnormal CT chest: Has two nodules and reticular pattern infiltrate, follow-up now by pulmonology. They plan to check a CT in few months. Ao Stenosis: Saw cardiology, Myoview negative, they rx echo next year Osteoporosis: Due for a prolia at this office by January 2018. Had a colonoscopy in [place], subsequently referred to [facility] as she had a flat polyp. RTC 03/2017, CPX

## 2016-10-29 HISTORY — PX: COLONOSCOPY: SHX5424

## 2016-11-01 ENCOUNTER — Other Ambulatory Visit: Payer: Self-pay | Admitting: Internal Medicine

## 2016-11-07 ENCOUNTER — Telehealth: Payer: Self-pay | Admitting: Internal Medicine

## 2016-11-07 NOTE — Telephone Encounter (Signed)
Prolia benefits verified  No PA required  Subject $183 deductible and 20% co-insurance for admin fee and Prolia   Please notify patient and schedule injection

## 2016-11-07 NOTE — Telephone Encounter (Signed)
PROLIA scheduled 12/11/2016.

## 2016-11-09 ENCOUNTER — Encounter: Payer: Self-pay | Admitting: Gastroenterology

## 2016-11-09 DIAGNOSIS — K5730 Diverticulosis of large intestine without perforation or abscess without bleeding: Secondary | ICD-10-CM | POA: Diagnosis not present

## 2016-11-09 DIAGNOSIS — K635 Polyp of colon: Secondary | ICD-10-CM | POA: Diagnosis not present

## 2016-11-09 DIAGNOSIS — D120 Benign neoplasm of cecum: Secondary | ICD-10-CM | POA: Diagnosis not present

## 2016-11-09 DIAGNOSIS — I10 Essential (primary) hypertension: Secondary | ICD-10-CM | POA: Diagnosis not present

## 2016-11-09 DIAGNOSIS — J449 Chronic obstructive pulmonary disease, unspecified: Secondary | ICD-10-CM | POA: Diagnosis not present

## 2016-11-09 DIAGNOSIS — M810 Age-related osteoporosis without current pathological fracture: Secondary | ICD-10-CM | POA: Diagnosis not present

## 2016-11-09 DIAGNOSIS — Z87891 Personal history of nicotine dependence: Secondary | ICD-10-CM | POA: Diagnosis not present

## 2016-11-09 DIAGNOSIS — Z79899 Other long term (current) drug therapy: Secondary | ICD-10-CM | POA: Diagnosis not present

## 2016-11-09 DIAGNOSIS — Z7982 Long term (current) use of aspirin: Secondary | ICD-10-CM | POA: Diagnosis not present

## 2016-11-13 ENCOUNTER — Encounter: Payer: Self-pay | Admitting: Gastroenterology

## 2016-12-02 DIAGNOSIS — R05 Cough: Secondary | ICD-10-CM | POA: Diagnosis not present

## 2016-12-02 DIAGNOSIS — J069 Acute upper respiratory infection, unspecified: Secondary | ICD-10-CM | POA: Diagnosis not present

## 2016-12-02 DIAGNOSIS — R0981 Nasal congestion: Secondary | ICD-10-CM | POA: Diagnosis not present

## 2016-12-04 DIAGNOSIS — R112 Nausea with vomiting, unspecified: Secondary | ICD-10-CM | POA: Diagnosis not present

## 2016-12-04 DIAGNOSIS — R062 Wheezing: Secondary | ICD-10-CM | POA: Diagnosis not present

## 2016-12-04 DIAGNOSIS — R05 Cough: Secondary | ICD-10-CM | POA: Diagnosis not present

## 2016-12-04 DIAGNOSIS — J209 Acute bronchitis, unspecified: Secondary | ICD-10-CM | POA: Diagnosis not present

## 2016-12-04 DIAGNOSIS — R197 Diarrhea, unspecified: Secondary | ICD-10-CM | POA: Diagnosis not present

## 2016-12-04 DIAGNOSIS — K529 Noninfective gastroenteritis and colitis, unspecified: Secondary | ICD-10-CM | POA: Diagnosis not present

## 2016-12-11 ENCOUNTER — Ambulatory Visit: Payer: BLUE CROSS/BLUE SHIELD

## 2016-12-18 ENCOUNTER — Ambulatory Visit (INDEPENDENT_AMBULATORY_CARE_PROVIDER_SITE_OTHER): Payer: Medicare Other

## 2016-12-18 DIAGNOSIS — M810 Age-related osteoporosis without current pathological fracture: Secondary | ICD-10-CM | POA: Diagnosis not present

## 2016-12-18 MED ORDER — DENOSUMAB 60 MG/ML ~~LOC~~ SOLN
60.0000 mg | Freq: Once | SUBCUTANEOUS | Status: AC
  Administered 2016-12-18: 60 mg via SUBCUTANEOUS

## 2016-12-18 NOTE — Progress Notes (Signed)
Pre visit review using our clinic tool,if applicable. No additional management support is needed unless otherwise documented below in the visit note.   Patient in for Prolia 60 mg inj. Given SQ Left arm.  Patient tolerated well.

## 2016-12-24 ENCOUNTER — Ambulatory Visit (INDEPENDENT_AMBULATORY_CARE_PROVIDER_SITE_OTHER)
Admission: RE | Admit: 2016-12-24 | Discharge: 2016-12-24 | Disposition: A | Discharge status: Home / Self Care | Payer: Medicare Other | Source: Ambulatory Visit | Attending: Pulmonary Disease | Admitting: Pulmonary Disease

## 2016-12-24 DIAGNOSIS — R911 Solitary pulmonary nodule: Secondary | ICD-10-CM | POA: Diagnosis not present

## 2016-12-28 ENCOUNTER — Encounter: Payer: Self-pay | Admitting: Internal Medicine

## 2016-12-28 ENCOUNTER — Ambulatory Visit (INDEPENDENT_AMBULATORY_CARE_PROVIDER_SITE_OTHER): Payer: Medicare Other | Admitting: Internal Medicine

## 2016-12-28 VITALS — BP 126/64 | HR 78 | Ht 66.0 in | Wt 160.0 lb

## 2016-12-28 DIAGNOSIS — J449 Chronic obstructive pulmonary disease, unspecified: Secondary | ICD-10-CM | POA: Diagnosis not present

## 2016-12-28 DIAGNOSIS — R918 Other nonspecific abnormal finding of lung field: Secondary | ICD-10-CM

## 2016-12-28 NOTE — Progress Notes (Signed)
Subjective:    Patient ID: Dominique [name], female    DOB: [birth year], 76 y.o.   MRN: [id number]  HPI   ROV 06/14/16 [facility] OV  Patient is here urgently for abnormal chest CT scan. 50-pack-year smoking history, quit when she was 76 years old. She has been diagnosed  with COPD but is not on meds. Not on o2. (-) DVT/PE. (-) CA. She sees Dr. Radford Pax for heart murmur. She ended up doing chest CTA which was (-) for PE but had other abnormalities. Chest CT scan showed right pulmonary artery dilated at 2.9 cm. The ascending aorta was also dilated. She had a groundglass like/reticular infiltrate over at the left upper lung, posterior segment with some tenting. She also had a 3.3 mm nodule in the anterior right middle lobe and a 4.3 mm nodule in the right lower lobe, subpleural. Pt saw Dr. Melvyn Novas for SOB in 05/2015 with no follow up done.  rec It was a pleasure taking care of you today! We will order a chest CT scan without contrast February 1st week 2018. Follow-up after the chest CT scan is done. Please let us know if you have issues with your  breathing. At that point, you may end up needing medicines for your COPD.     12/28/2016  f/u ov/M[facility] [place] re:  GOLD II with reversibility  Chief Complaint  Patient presents with  . Follow-up    Dr. Corrie Dandy pt here to review ct chest results.   just returned from cruise where  walked all over the [place] / on ship s sob on no rx - has saba never uses   No obvious day to day or daytime variability or assoc excess/ purulent sputum or mucus plugs or hemoptysis or cp or chest tightness, subjective wheeze or overt sinus or hb symptoms. No unusual exp hx or h/o childhood pna/ asthma or knowledge of premature birth.  Sleeping ok without nocturnal  or early am exacerbation  of respiratory  c/o's or need for noct saba. Also denies any obvious fluctuation of symptoms with weather or environmental changes or other aggravating or alleviating factors except as outlined above    Current Medications, Allergies, Complete Past Medical History, Past Surgical History, Family History, and Social History were reviewed in [employer] record.  ROS  The following are not active complaints unless bolded sore throat, dysphagia, dental problems, itching, sneezing,  nasal congestion or excess/ purulent secretions, ear ache,   fever, chills, sweats, unintended wt loss, classically pleuritic or exertional cp,  orthopnea pnd or leg swelling, presyncope, palpitations, abdominal pain, anorexia, nausea, vomiting, diarrhea  or change in bowel or bladder habits, change in stools or urine, dysuria,hematuria,  rash, arthralgias, visual complaints, headache, numbness, weakness or ataxia or problems with walking or coordination,  change in mood/affect or memory.             Objective:   Physical Exam      12/28/2016        160  06/14/16 72.1 kg (159 lb)  06/05/16 71.5 kg (157 lb 9.6 oz)  04/24/16 70.8 kg (156 lb)     HEENT: nl dentition, turbinates bilaterally, and oropharynx. Nl external ear canals without cough reflex   NECK :  without JVD/Nodes/TM/ nl carotid upstrokes bilaterally   LUNGS: no acc muscle use,  Nl contour chest which is clear to A and P bilaterally without cough on insp or exp maneuvers   CV:  RRR  no s3  or murmur or increase in P2, and no edema   ABD:  soft and nontender with nl inspiratory excursion in the supine position. No bruits or organomegaly appreciated, bowel sounds nl  MS:  Nl gait/ ext warm without deformities, calf tenderness, cyanosis or clubbing No obvious joint restrictions   SKIN: warm and dry without lesions    NEURO:  alert, approp, nl sensorium with  no motor or cerebellar deficits apparent.    I personally reviewed images and agree with radiology impression as follows:  CT w/o contrast Chest  12/24/16 3 mm triangular subpleural nodule in the right middle lobe, likely reflecting a benign subpleural lymph node,  unchanged. Dedicated follow-up imaging is not required per Fleischner Society guidelines        Assessment & Plan:

## 2016-12-28 NOTE — Patient Instructions (Signed)
Only use your albuterol as a rescue medication to be used if you can't catch your breath by resting or doing a relaxed purse lip breathing pattern.  - The less you use it, the better it will work when you need it. - Ok to use up to 2 puffs  every 4 hours if you must but call for immediate appointment if use goes up over your usual need - Don't leave home without it !!  (think of it like the spare tire for your car)    No dedicated follow up is needed at this point -  Return here as needed

## 2016-12-30 ENCOUNTER — Encounter: Payer: Self-pay | Admitting: Internal Medicine

## 2016-12-30 NOTE — Assessment & Plan Note (Addendum)
Asymptomatic since stopping smoking 2005 - .PFT's  12/28/2016  FEV1 1.25 (57 % ) ratio 5  p 13 % improvement from saba p nothing prior to study with DLCO  41 % corrects to 60  % for alv volume      - The proper method of use, as well as anticipated side effects, of a metered-dose inhaler are discussed and demonstrated to the patient.    I had an extended final summary discussion with the patient reviewing all relevant studies completed to date and  lasting 15 to 20 minutes of a 25 minute visit on the following issues:     I reviewed the Fletcher curve with the patient that basically indicates  if you quit smoking when your best day FEV1 is still well preserved (as is relatively true  here)  it is highly unlikely you will progress to severe disease and informed the patient there was  no medication on the market that has proven to alter the curve/ its downward trajectory  or the likelihood of progression of their disease(unlike other chronic medical conditions such as atheroclerosis where we do think we can change the natural hx with risk reducing meds)    Therefore stopping smoking and maintaining abstinence is the most important aspect of care, not choice of inhalers or for that matter, doctors.    If intermittent symptoms ok to use saba, if more regular need then return here for re-evaluation and consideration for another trial off acei (could not tol arb though when this issue was raised last.  Pulmonary f/u is prn

## 2017-01-07 ENCOUNTER — Ambulatory Visit: Payer: Medicare Other | Admitting: Cardiology

## 2017-01-27 ENCOUNTER — Other Ambulatory Visit: Payer: Self-pay | Admitting: Internal Medicine

## 2017-02-20 ENCOUNTER — Encounter: Payer: Self-pay | Admitting: Cardiology

## 2017-02-20 ENCOUNTER — Encounter (INDEPENDENT_AMBULATORY_CARE_PROVIDER_SITE_OTHER): Payer: Self-pay

## 2017-02-20 ENCOUNTER — Ambulatory Visit (INDEPENDENT_AMBULATORY_CARE_PROVIDER_SITE_OTHER): Payer: Medicare Other | Admitting: Cardiology

## 2017-02-20 VITALS — BP 162/82 | HR 98 | Ht 66.0 in | Wt 159.0 lb

## 2017-02-20 DIAGNOSIS — I350 Nonrheumatic aortic (valve) stenosis: Secondary | ICD-10-CM

## 2017-02-20 DIAGNOSIS — I272 Pulmonary hypertension, unspecified: Secondary | ICD-10-CM | POA: Diagnosis not present

## 2017-02-20 DIAGNOSIS — R079 Chest pain, unspecified: Secondary | ICD-10-CM | POA: Diagnosis not present

## 2017-02-20 DIAGNOSIS — I10 Essential (primary) hypertension: Secondary | ICD-10-CM | POA: Diagnosis not present

## 2017-02-20 DIAGNOSIS — I2510 Atherosclerotic heart disease of native coronary artery without angina pectoris: Secondary | ICD-10-CM | POA: Insufficient documentation

## 2017-02-20 LAB — BASIC METABOLIC PANEL
BUN/Creatinine Ratio: 28 (ref 12–28)
BUN: 19 mg/dL (ref 8–27)
CALCIUM: 9.8 mg/dL (ref 8.7–10.3)
CHLORIDE: 98 mmol/L (ref 96–106)
CO2: 24 mmol/L (ref 18–29)
Creatinine, Ser: 0.69 mg/dL (ref 0.57–1.00)
GFR calc Af Amer: 98 mL/min/{1.73_m2} (ref >59)
GFR, EST NON AFRICAN AMERICAN: 85 mL/min/{1.73_m2} (ref >59)
Glucose: 89 mg/dL (ref 65–99)
POTASSIUM: 4.5 mmol/L (ref 3.5–5.2)
Sodium: 140 mmol/L (ref 134–144)

## 2017-02-20 NOTE — Patient Instructions (Signed)
Medication Instructions:  Your physician recommends that you continue on your current medications as directed. Please refer to the Current Medication list given to you today.   Labwork: TODAY: BMET  Testing/Procedures: Your physician has requested that you have an echocardiogram in August, 2018. Echocardiography is a painless test that uses sound waves to create images of your heart. It provides your doctor with information about the size and shape of your heart and how well your heart's chambers and valves are working. This procedure takes approximately one hour. There are no restrictions for this procedure.   Dr. Radford Pax recommends you have a CORONARY CT.  Follow-Up: Your physician wants you to follow-up in: 6 months with Dr. Radford Pax. You will receive a reminder letter in the mail two months in advance. If you don't receive a letter, please call our office to schedule the follow-up appointment.   Any Other Special Instructions Will Be Listed Below (If Applicable).     If you need a refill on your cardiac medications before your next appointment, please call your pharmacy.

## 2017-02-20 NOTE — Progress Notes (Signed)
Cardiology Office Note    Date:  02/20/2017   ID:  Dominique [name], DOB [birth year], MRN [id number]  PCP:  Kathlene November, MD  Cardiologist:  Fransico Him, MD   Chief Complaint  Patient presents with  . Aortic Stenosis  . Hypertension    History of Present Illness:  Dominique [name] is a 76 y.o. female with a history of  moderate AS, HTN, moderate pulmonary HTN with PASP of 13mmHg, moderate TR and mildly dilated RV.  When I last saw her she was complaining of SOB and chest pain and nuclear stress test showed no ischemia.  Chest CT showed coronary artery calcifications in the LAD.  PFTs were done showing severe obstructive disease c/w COPD.  She is back today for followup and is doing well.  She continues to have SOB when she walks and talks at the same time.  It has not changed any since I saw her last.  She says that from time to time she will still have some chest discomfort but no change from the past.  She has noticed that she will have a fast heart beat in her head occasionally.  Her LE edema has resolved and has not been a problem since starting on compression hose.  She denies any dizziness, claudication, PND, orthopnea or syncope.    Past Medical History:  Diagnosis Date  . Aortic stenosis, moderate    echo 04/2016  . Coronary artery calcification seen on CAT scan    no ischemia no nuclear stress test 2017  . Emphysema lung ([place])   . History of blood transfusion 1971   with C-section complication  . Hyperlipidemia   . Hypertension   . Pulmonary HTN ([place]) 06/05/2016   Moderate with PASP 35mmHg by echo 04/2016 likely Group 3 from COPD and possibly Group 2 from pulmonary venous HTN associated with moderate AS  . Sacral fracture ([place])   . UTI (lower urinary tract infection)     Past Surgical History:  Procedure Laterality Date  . APPENDECTOMY  1951  . CATARACT EXTRACTION    . CESAREAN SECTION     1971  . JOINT REPLACEMENT  2016   sacroplasty  . LAMINECTOMY    . TOTAL HIP  ARTHROPLASTY Bilateral   . TUBAL LIGATION  1975    Current Medications: Current Meds  Medication Sig  . acetaminophen (TYLENOL) 500 MG tablet Take 500 mg by mouth 2 (two) times daily as needed (pain). Reported on 04/24/2016  . albuterol (PROVENTIL HFA;VENTOLIN HFA) 108 (90 Base) MCG/ACT inhaler Inhale 2 puffs into the lungs every 6 (six) hours as needed for wheezing or shortness of breath.  Marland Kitchen amLODipine (NORVASC) 5 MG tablet Take 1 tablet (5 mg total) by mouth daily.  Marland Kitchen aspirin EC 81 MG tablet Take 81 mg by mouth daily.  . benazepril (LOTENSIN) 20 MG tablet Take 1 tablet (20 mg total) by mouth daily.  . Calcium Carb-Cholecalciferol (CALCIUM 600 + D PO) Take 600 mg by mouth daily.  . Cholecalciferol (VITAMIN D3) 5000 UNITS CAPS Take 5,000 Units by mouth daily.   . Multiple Vitamin (MULTIVITAMIN WITH MINERALS) TABS tablet Take 1 tablet by mouth daily.  . pravastatin (PRAVACHOL) 20 MG tablet Take 1 tablet (20 mg total) by mouth daily.   Current Facility-Administered Medications for the 02/20/17 encounter (Office Visit) with Sueanne Margarita, MD  Medication  . 0.9 %  sodium chloride infusion    Allergies:   Patient has no known allergies.  Social History   Social History  . Marital status: Widowed    Spouse name: N/A  . Number of children: 2  . Years of education: N/A   Occupational History  . n/a    Social History Main Topics  . Smoking status: Former Smoker    Packs/day: 1.00    Years: 40.00    Types: Cigarettes    Quit date: 03/19/2004  . Smokeless tobacco: Never Used  . Alcohol use 1.8 - 2.4 oz/week    3 - 4 Glasses of wine per week     Comment: socially  . Drug use: No  . Sexual activity: Not Asked   Other Topics Concern  . None   Social History Narrative   From [place]   1 living son     Family History:  The patient's family history includes Breast cancer in her mother; Cancer in her brother; Diabetes in her son.   ROS:   Please see the history of present  illness.    ROS All other systems reviewed and are negative.  No flowsheet data found.     PHYSICAL EXAM:   VS:  BP (!) 162/82   Pulse 98   Ht 5\' 6"  (1.676 m)   Wt 159 lb (72.1 kg)   BMI 25.66 kg/m    GEN: Well nourished, well developed, in no acute distress  HEENT: normal  Neck: no JVD, carotid bruits, or masses Cardiac: RRR; no rubs, or gallops,no edema.  Intact distal pulses bilaterally. 2/6 SM at RUSB to LLSB mid peaking Respiratory:  clear to auscultation bilaterally, normal work of breathing GI: soft, nontender, nondistended, + BS MS: no deformity or atrophy  Skin: warm and dry, no rash Neuro:  Alert and Oriented x 3, Strength and sensation are intact Psych: euthymic mood, full affect  Wt Readings from Last 3 Encounters:  02/20/17 159 lb (72.1 kg)  12/28/16 160 lb (72.6 kg)  10/15/16 157 lb 8 oz (71.4 kg)      Studies/Labs Reviewed:   EKG:  EKG is ordered today.  The ekg ordered today demonstrates NSR at 85bpm with PVCs and no ST changes  Recent Labs: 04/24/2016: ALT 22; Hemoglobin 13.9; Platelets 318.0 10/15/2016: BUN 16; Creatinine, Ser 0.69; Potassium 4.5; Sodium 140   Lipid Panel    Component Value Date/Time   CHOL 223 (H) 04/24/2016 1050   TRIG 96.0 04/24/2016 1050   HDL 78.10 04/24/2016 1050   CHOLHDL 3 04/24/2016 1050   VLDL 19.2 04/24/2016 1050   LDLCALC 126 (H) 04/24/2016 1050    Additional studies/ records that were reviewed today include:  none    ASSESSMENT:    1. Aortic valve stenosis, etiology of cardiac valve disease unspecified   2. Essential hypertension   3. Pulmonary HTN (HCC)   4. Chest pain, unspecified type   5. Coronary artery calcification seen on CAT scan      PLAN:  In order of problems listed above:  1. Moderate aortic stenosis - she is asymptomatic except for SOB which I think is likely due more from COPD.  I will repeat an echo 05/2017 to make sure this is stable.    2. HTN - BP elevated this am but she has not  taken her meds this am.  At home it runs 120/43mmHg.  She will continue on amlodipine, ACE I  3. Pulmonary HTN - moderate with PASP 76mmHg by echo.  This is likely due to group 3 from COPD but also could  have a component of Group 2 with pulmonary venous HTN from her AS.  Will repeat echo 05/2016 to make sure this is stable.   4.   Chest pain that is still occasionally present despite normal stress test in 05/2016.  She had evidence of coronary artery calcifications in the LAD on chest CT.  I will get a Coronary CTA with morphology, FFR and calcium score to assess further. She will continue on ASA and statin.     Medication Adjustments/Labs and Tests Ordered: Current medicines are reviewed at length with the patient today.  Concerns regarding medicines are outlined above.  Medication changes, Labs and Tests ordered today are listed in the Patient Instructions below.  There are no Patient Instructions on file for this visit.   Signed, Fransico Him, MD  02/20/2017 10:23 AM    [facility] [place], [place],   [address] Phone: [phone]; Fax: [phone]

## 2017-03-05 ENCOUNTER — Telehealth: Payer: Self-pay

## 2017-03-05 NOTE — Telephone Encounter (Signed)
-----   Message from Sueanne Margarita, MD sent at 03/02/2017  2:22 PM EDT ----- Valetta Fuller,   Please call patient and find out if she is still having CP.  If no then we need to set her up on the new Ct scaner next month Traci ----- Message ----- From: Dorothy Spark, MD Sent: 02/28/2017   5:31 PM To: Sueanne Margarita, MD  Traci, This patient is moderate risk based on her age and she will probably have quite a lot of calcium, if you could wait till the next month for the new scanner I would prefer that, if its urgent, you could schedule on the current one. [place] Siren  ----- Message ----- From: Sueanne Margarita, MD Sent: 02/28/2017   4:00 PM To: Dorothy Spark, MD  This is another one Nish sent me not to do  Traci ----- Message ----- From: Josue Hector, MD Sent: 02/27/2017   5:28 PM To: Sueanne Margarita, MD  Age HR 35 and known coronary calcification would not scan her on the Siemens 128 can we wait until end of June to scan on new machine or do you want to cath her ??Let me know

## 2017-03-05 NOTE — Telephone Encounter (Signed)
Patient reports she is having no CP. She states at this time, she believes she can wait until new scanner is running to have CT. She understands to call if CP occurs in the meantime. She was grateful for call.

## 2017-04-01 ENCOUNTER — Telehealth: Payer: Self-pay | Admitting: Cardiology

## 2017-04-01 NOTE — Telephone Encounter (Signed)
02-20-17 Dr. Radford Pax has order Cardiac Ct  -Chest pain that is still occasionally present despite normal stress test in 05/2016.  She had evidence of coronary artery calcifications in the LAD on chest CT.  I will get a Coronary CTA with morphology, FFR and calcium score to assess further. She will continue on ASA and statin.     Please advise and review for July schedule week of July 9th

## 2017-04-01 NOTE — Telephone Encounter (Signed)
Yes put on for am of 7/11 or during day 7/12

## 2017-04-08 ENCOUNTER — Telehealth: Payer: Self-pay | Admitting: Cardiology

## 2017-04-08 NOTE — Telephone Encounter (Signed)
Left message for patient that she will be called tomorrow to follow-up.

## 2017-04-08 NOTE — Telephone Encounter (Signed)
Spoke with patient today to schedule her for the Cardiac CT on 7-11 or 7-12 per Nishan.  She stated that why did it take so long to schedule and that was a long way off.  She said she was aware that the new machine was coming but it has taking to long.  She might what to have this test done somewhere else.  I tried to explain but she wanted to talk with the nurse.

## 2017-04-09 NOTE — Telephone Encounter (Signed)
Left message to call back  

## 2017-04-09 NOTE — Telephone Encounter (Signed)
Patient is upset that her coronary CT is taking so long to schedule. Reiterated to patient that she reported in May she could wait for scan because she was asymptomatic and had no CP. At that time, she was instructed to call if CP occurred.  She was angry when she was called yesterday to schedule coronary CT in July.  She is now complaining that for about the last week, she has had daily tightening in her chest around noon that last several minutes. It is the same feeling she described to Dr. Radford Pax, but it is happening daily now instead of monthly.  It is not "bad pain, but it is noticeable."  She does not associate the feeling with eating and happens at rest mostly.   She is asymptomatic now.  She understands she will be called with further recommendations.

## 2017-04-09 NOTE — Telephone Encounter (Signed)
I had recommended cath prior but patient wanted to wait until coronary CT.  OK to proceed with cath.  She will need to come in for cath workup

## 2017-04-11 NOTE — Telephone Encounter (Signed)
Please find out from Dr. Meda Coffee the soonest that patient could have her scan done

## 2017-04-11 NOTE — Telephone Encounter (Signed)
Patient is now angry and adamantly refuses heart catheterization at this time stating she was told the CT would tell if she absolutely needed a cath or not. She requests to have CT done elsewhere. Reiterated to patient that [facility] doctors will not read the test if it is done at another facility and it may be out of pocket.  She "does not care." She wants the test done at another facility. She will call her insurance provider to see cost. She expects a call back with confirmation of where Dr. Radford Pax would like the test done.

## 2017-04-12 NOTE — Telephone Encounter (Signed)
New message    Pt is calling about scheduling CT scan in [facility]. Please call.

## 2017-04-12 NOTE — Telephone Encounter (Signed)
Left message to call back  

## 2017-04-14 ENCOUNTER — Other Ambulatory Visit: Payer: Self-pay | Admitting: Internal Medicine

## 2017-04-15 ENCOUNTER — Other Ambulatory Visit: Payer: Self-pay

## 2017-04-15 DIAGNOSIS — R079 Chest pain, unspecified: Secondary | ICD-10-CM

## 2017-04-15 DIAGNOSIS — I2510 Atherosclerotic heart disease of native coronary artery without angina pectoris: Secondary | ICD-10-CM

## 2017-04-15 NOTE — Telephone Encounter (Signed)
Patient requests order sent to [facility] to have cardiac CT. She complains that the wait has simply been too long. Reiterated to patient that there have been several check-ins to ensure she could wait as she now wants the procedure done ASAP.  Order given to CT scheduler to arrange.

## 2017-04-16 ENCOUNTER — Encounter: Payer: BLUE CROSS/BLUE SHIELD | Admitting: Internal Medicine

## 2017-04-25 ENCOUNTER — Telehealth: Payer: Self-pay | Admitting: Cardiology

## 2017-04-25 NOTE — Telephone Encounter (Signed)
New Message     She needs to know where she is suppose to have test done at and what is the phone number , CT?

## 2017-04-26 NOTE — Telephone Encounter (Signed)
04-26-17  Lvmom to call and discuss Cardiac ct that need to be schedule at [facility].

## 2017-04-29 ENCOUNTER — Telehealth: Payer: Self-pay | Admitting: Cardiology

## 2017-04-29 NOTE — Telephone Encounter (Signed)
Mack Guise B 20 minutes ago (10:14 AM)     04-29-17  Spoke with patient  - she is schedule for Cardiac Ct on 04-30-17 @ 11:00 @ [facility]  -  Address and phone [phone] was giving - [phone] Dr.,  Gaspar Cola @ [facility]  - she is to go the [facility] entrance - NPO x 2 hours and to arrive 30 minutes before the appointment.

## 2017-04-29 NOTE — Telephone Encounter (Signed)
04-29-17  Spoke with patient  - she is schedule for Cardiac Ct on 04-30-17 @ 11:00 @ [facility]  -  Address and phone [phone] was giving - [phone] Dr.,  Gaspar Cola @ [facility]  - she is to go the [facility] entrance - NPO x 2 hours and to arrive 30 minutes before the appointment.

## 2017-04-29 NOTE — Telephone Encounter (Signed)
New Message ° ° pt verbalized that she is returning call for rn °

## 2017-04-30 DIAGNOSIS — I77810 Thoracic aortic ectasia: Secondary | ICD-10-CM | POA: Diagnosis not present

## 2017-04-30 DIAGNOSIS — I700 Atherosclerosis of aorta: Secondary | ICD-10-CM | POA: Diagnosis not present

## 2017-04-30 DIAGNOSIS — R079 Chest pain, unspecified: Secondary | ICD-10-CM | POA: Diagnosis not present

## 2017-05-02 DIAGNOSIS — Z1231 Encounter for screening mammogram for malignant neoplasm of breast: Secondary | ICD-10-CM | POA: Diagnosis not present

## 2017-05-02 DIAGNOSIS — Z6826 Body mass index (BMI) 26.0-26.9, adult: Secondary | ICD-10-CM | POA: Diagnosis not present

## 2017-05-02 DIAGNOSIS — Z124 Encounter for screening for malignant neoplasm of cervix: Secondary | ICD-10-CM | POA: Diagnosis not present

## 2017-05-02 DIAGNOSIS — Z01419 Encounter for gynecological examination (general) (routine) without abnormal findings: Secondary | ICD-10-CM | POA: Diagnosis not present

## 2017-05-02 LAB — HM PAP SMEAR

## 2017-05-03 ENCOUNTER — Telehealth: Payer: Self-pay | Admitting: Cardiology

## 2017-05-03 NOTE — Telephone Encounter (Signed)
New message    Pt is calling for results of the test she had done.

## 2017-05-03 NOTE — Telephone Encounter (Signed)
Informed patient that results have not yet been received.  Reiterated to patient that Dr. Radford Pax is not here this week as well, but as soon as she returns and reviews the CT she will be called with recommendations. Patient was grateful for call.

## 2017-05-06 DIAGNOSIS — D225 Melanocytic nevi of trunk: Secondary | ICD-10-CM | POA: Diagnosis not present

## 2017-05-06 DIAGNOSIS — D1800 Hemangioma unspecified site: Secondary | ICD-10-CM | POA: Diagnosis not present

## 2017-05-06 DIAGNOSIS — L570 Actinic keratosis: Secondary | ICD-10-CM | POA: Diagnosis not present

## 2017-05-06 DIAGNOSIS — L821 Other seborrheic keratosis: Secondary | ICD-10-CM | POA: Diagnosis not present

## 2017-05-06 DIAGNOSIS — L814 Other melanin hyperpigmentation: Secondary | ICD-10-CM | POA: Diagnosis not present

## 2017-05-08 ENCOUNTER — Telehealth: Payer: Self-pay | Admitting: Cardiology

## 2017-05-08 DIAGNOSIS — I2510 Atherosclerotic heart disease of native coronary artery without angina pectoris: Secondary | ICD-10-CM

## 2017-05-08 DIAGNOSIS — R079 Chest pain, unspecified: Secondary | ICD-10-CM

## 2017-05-08 DIAGNOSIS — I259 Chronic ischemic heart disease, unspecified: Secondary | ICD-10-CM

## 2017-05-08 NOTE — Telephone Encounter (Signed)
Confirmed with [facility] that only calcium score was completed. Also confirmed with them their scanner does not complete FFR.  To Dr. Radford Pax to review calcium score located in Care Everywhere.

## 2017-05-08 NOTE — Telephone Encounter (Signed)
Called and spoke with [facility] in the [place] department at [facility] to inform her that in [place], only a regular calcium score was completed. Confirmed with her that a coronary CT was ordered (she stated she had the paper order still) and that that incorrect test was completed. She states she will talk with the supervisor and call back today to follow-up.

## 2017-05-08 NOTE — Telephone Encounter (Signed)
Patient calling, states that she is calling for ct scan results

## 2017-05-09 ENCOUNTER — Ambulatory Visit (INDEPENDENT_AMBULATORY_CARE_PROVIDER_SITE_OTHER): Payer: Medicare Other | Admitting: Internal Medicine

## 2017-05-09 ENCOUNTER — Encounter: Payer: Self-pay | Admitting: Internal Medicine

## 2017-05-09 VITALS — BP 126/70 | HR 81 | Temp 98.1°F | Resp 14 | Ht 66.0 in | Wt 159.2 lb

## 2017-05-09 DIAGNOSIS — E785 Hyperlipidemia, unspecified: Secondary | ICD-10-CM | POA: Diagnosis not present

## 2017-05-09 DIAGNOSIS — I2510 Atherosclerotic heart disease of native coronary artery without angina pectoris: Secondary | ICD-10-CM

## 2017-05-09 DIAGNOSIS — M810 Age-related osteoporosis without current pathological fracture: Secondary | ICD-10-CM

## 2017-05-09 DIAGNOSIS — Z0000 Encounter for general adult medical examination without abnormal findings: Secondary | ICD-10-CM

## 2017-05-09 DIAGNOSIS — I10 Essential (primary) hypertension: Secondary | ICD-10-CM | POA: Diagnosis not present

## 2017-05-09 DIAGNOSIS — J439 Emphysema, unspecified: Secondary | ICD-10-CM

## 2017-05-09 NOTE — Telephone Encounter (Signed)
Results are in [place] under "other results."

## 2017-05-09 NOTE — Progress Notes (Signed)
Pre visit review using our clinic review tool, if applicable. No additional management support is needed unless otherwise documented below in the visit note. 

## 2017-05-09 NOTE — Progress Notes (Signed)
Subjective:    Patient ID: Dominique [name], female    DOB: 05-15-1941, 76 y.o.   MRN: [id number]  DOS:  05/09/2017 Type of visit - description : Routine visit Interval history: HTN: Good compliance of medication. High cholesterol: on Pravachol, no apparent side effects Chest pain, this is a chronic issue, since the last visit had a calcium coronary score done. Also, had a colonoscopy done, report reviewed.  Review of Systems Denies nausea, vomiting, diarrhea. No blood in the stools. No anxiety or depression.  Past Medical History:  Diagnosis Date  . Aortic stenosis, moderate    echo 04/2016  . Coronary artery calcification seen on CAT scan    no ischemia no nuclear stress test 2017  . Emphysema lung ([place])   . History of blood transfusion 1971   with C-section complication  . Hyperlipidemia   . Hypertension   . Pulmonary HTN ([place]) 06/05/2016   Moderate with PASP 38mmHg by echo 04/2016 likely Group 3 from COPD and possibly Group 2 from pulmonary venous HTN associated with moderate AS  . Sacral fracture ([place])   . UTI (lower urinary tract infection)     Past Surgical History:  Procedure Laterality Date  . APPENDECTOMY  1951  . CATARACT EXTRACTION    . CESAREAN SECTION     1971  . JOINT REPLACEMENT  2016   sacroplasty  . LAMINECTOMY    . TOTAL HIP ARTHROPLASTY Bilateral   . TUBAL LIGATION  1975    Social History   Social History  . Marital status: Widowed    Spouse name: N/A  . Number of children: 2  . Years of education: N/A   Occupational History  . n/a    Social History Main Topics  . Smoking status: Former Smoker    Packs/day: 1.00    Years: 40.00    Types: Cigarettes    Quit date: 03/19/2004  . Smokeless tobacco: Never Used  . Alcohol use 1.8 - 2.4 oz/week    3 - 4 Glasses of wine per week     Comment: socially  . Drug use: No  . Sexual activity: Not on file   Other Topics Concern  . Not on file   Social History Narrative   From [place]   1  living son      Allergies as of 05/09/2017   No Known Allergies     Medication List       Accurate as of 05/09/17 11:59 PM. Always use your most recent med list.          acetaminophen 500 MG tablet Commonly known as:  TYLENOL Take 500 mg by mouth 2 (two) times daily as needed (pain). Reported on 04/24/2016   albuterol 108 (90 Base) MCG/ACT inhaler Commonly known as:  PROVENTIL HFA;VENTOLIN HFA Inhale 2 puffs into the lungs every 6 (six) hours as needed for wheezing or shortness of breath.   amLODipine 5 MG tablet Commonly known as:  NORVASC Take 1 tablet (5 mg total) by mouth daily.   aspirin EC 81 MG tablet Take 81 mg by mouth daily.   benazepril 20 MG tablet Commonly known as:  LOTENSIN Take 1 tablet (20 mg total) by mouth daily.   CALCIUM 600 + D PO Take 600 mg by mouth daily.   denosumab 60 MG/ML Soln injection Commonly known as:  PROLIA Inject 60 mg into the skin every 6 (six) months. Administer in upper arm, thigh, or abdomen   multivitamin with  minerals Tabs tablet Take 1 tablet by mouth daily.   pravastatin 20 MG tablet Commonly known as:  PRAVACHOL Take 1 tablet (20 mg total) by mouth daily.   Vitamin D3 5000 units Caps Take 5,000 Units by mouth daily.          Objective:   Physical Exam BP 126/70 (BP Location: Left Arm, Patient Position: Sitting, Cuff Size: Small)   Pulse 81   Temp 98.1 F (36.7 C) (Oral)   Resp 14   Ht 5\' 6"  (1.676 m)   Wt 159 lb 4 oz (72.2 kg)   SpO2 93%   BMI 25.70 kg/m  General:   Well developed, well nourished . NAD.  HEENT:  Normocephalic . Face symmetric, atraumatic Lungs:  CTA B Normal respiratory effort, no intercostal retractions, no accessory muscle use. Heart: RRR,  + systolic murmur.  Trace pretibial edema , slightly worse on the right Abdomen:  Not distended, soft, non-tender. No rebound or rigidity.  Skin: Not pale. Not jaundice Neurologic:  alert & oriented X3.  Speech normal, gait appropriate  for age and unassisted Psych--  Cognition and judgment appear intact.  Cooperative with normal attention span and concentration.  Behavior appropriate. No anxious or depressed appearing.    Assessment & Plan:   Assessment HTN Hyperlipidemia PULM: --Former smoker, quit 2006 --H/o Emphysema, remote -- CT chest angio  05-2016---> pulm nodule x2,reticular infiltrate (pulm rx redo CT chest), enlarged R P.A. and Ao (pulm rec CTA q year) CV: ---H/o murmur (since childhood) --Aortic stenosis: ECHO 7-17, AoS, saw cards, recheck 1 year --DOE: (-) myoview  8-017, CT angio chest 05-2016:  no PE (see above) MSK --DJD --Sacral Fx, sacral insufficiency, s/p sacroplasty (radiology) 07-2015 --H/o osteoporosis :  took fosamax while in [place], Fosamax rx again by ortho after sacral Fx 07-2015, never took it, s/p #1 prolia 11-14-2015 --T score 08-2015 >> normal (likely normal d/t DJD?) H/o vit D def: Normal level 09-2015   PLAN HTN: Continue amlodipine, Lotensin. Check a CBC High cholesterol: Continue Pravachol, check LFTs, FLP. Colon polyps: Chart reviewed, had a colonoscopy 10-2011. Next colonoscopy per GI Emphysema: Essentially asx. Does not use inhalers frequently. Chest pain: Reports is a chronic problem, previously Myoview was negative, just had a calcium coronary score, results interpretation per cards. Osteoporosis: On Prolia, will check a BMP, will order a bone density test at the next visit. RTC 6 months. Recommend a Medicare wellness.

## 2017-05-09 NOTE — Telephone Encounter (Signed)
There are 2 areas of calcification of the LAD with increased calcium score as well as mildly dilated aortic root at 4cm.  She has atherosclerosis of the aorta and emphysema of the lungs.  Cannot rule out CAD from this study. She will need a formal coronary CTA with morphology and FFR

## 2017-05-09 NOTE — Telephone Encounter (Signed)
F/U Call:  Patient calling for update on results.

## 2017-05-09 NOTE — Telephone Encounter (Signed)
I don't have the results.

## 2017-05-09 NOTE — Patient Instructions (Signed)
  GO TO THE FRONT DESK Schedule labs to be done tomorrow or next week, fasting.  Schedule your next appointment for a  routine checkup in 6 months  Consider a Medicare wellness with one of our nurses.

## 2017-05-09 NOTE — Assessment & Plan Note (Addendum)
-  Td 2017; pnm shot 2012; prevnar 2015; zostavax: declined before; shingrex discussed, declined -Female care:  saw Dr Lynnette Caffey 2 weeks ago, reports she had a PAP. MMG 04-2016 and 2 weeks ago per pt   -CCS:  Had 2-3 Cscopes before, +polyps,  cscope [place] 06-2016, referred to [facility] scope done 11-09-16, bx sessile serrated polyp, next per GI

## 2017-05-09 NOTE — Telephone Encounter (Signed)
Informed patient that the incorrect test was completed at [facility] (only the calcium score was done). She requests Dr. Radford Pax to review the test and give recommendations based on those results prior to scheduling further testing.

## 2017-05-10 ENCOUNTER — Other Ambulatory Visit (INDEPENDENT_AMBULATORY_CARE_PROVIDER_SITE_OTHER): Payer: Medicare Other

## 2017-05-10 DIAGNOSIS — M810 Age-related osteoporosis without current pathological fracture: Secondary | ICD-10-CM

## 2017-05-10 DIAGNOSIS — I10 Essential (primary) hypertension: Secondary | ICD-10-CM

## 2017-05-10 DIAGNOSIS — E785 Hyperlipidemia, unspecified: Secondary | ICD-10-CM | POA: Diagnosis not present

## 2017-05-10 LAB — LIPID PANEL
CHOLESTEROL: 203 mg/dL — AB (ref 0–200)
HDL: 70.5 mg/dL (ref >39.00)
LDL CALC: 119 mg/dL — AB (ref 0–99)
NonHDL: 132.75
TRIGLYCERIDES: 69 mg/dL (ref 0.0–149.0)
Total CHOL/HDL Ratio: 3
VLDL: 13.8 mg/dL (ref 0.0–40.0)

## 2017-05-10 LAB — HEPATIC FUNCTION PANEL
ALT: 18 U/L (ref 0–35)
AST: 21 U/L (ref 0–37)
Albumin: 4 g/dL (ref 3.5–5.2)
Alkaline Phosphatase: 40 U/L (ref 39–117)
BILIRUBIN DIRECT: 0.1 mg/dL (ref 0.0–0.3)
TOTAL PROTEIN: 7.2 g/dL (ref 6.0–8.3)
Total Bilirubin: 0.5 mg/dL (ref 0.2–1.2)

## 2017-05-10 LAB — BASIC METABOLIC PANEL
BUN: 26 mg/dL — ABNORMAL HIGH (ref 6–23)
CHLORIDE: 103 meq/L (ref 96–112)
CO2: 26 mEq/L (ref 19–32)
Calcium: 9.6 mg/dL (ref 8.4–10.5)
Creatinine, Ser: 0.84 mg/dL (ref 0.40–1.20)
GFR: 69.87 mL/min (ref >60.00)
Glucose, Bld: 88 mg/dL (ref 70–99)
POTASSIUM: 4.2 meq/L (ref 3.5–5.1)
SODIUM: 138 meq/L (ref 135–145)

## 2017-05-10 LAB — CBC WITH DIFFERENTIAL/PLATELET
BASOS ABS: 0.1 10*3/uL (ref 0.0–0.1)
Basophils Relative: 1.1 % (ref 0.0–3.0)
EOS ABS: 0.5 10*3/uL (ref 0.0–0.7)
Eosinophils Relative: 6.8 % — ABNORMAL HIGH (ref 0.0–5.0)
HEMATOCRIT: 42.4 % (ref 36.0–46.0)
Hemoglobin: 14 g/dL (ref 12.0–15.0)
LYMPHS ABS: 2.2 10*3/uL (ref 0.7–4.0)
LYMPHS PCT: 27.4 % (ref 12.0–46.0)
MCHC: 33 g/dL (ref 30.0–36.0)
MCV: 92.8 fl (ref 78.0–100.0)
MONO ABS: 0.7 10*3/uL (ref 0.1–1.0)
Monocytes Relative: 9.3 % (ref 3.0–12.0)
NEUTROS ABS: 4.4 10*3/uL (ref 1.4–7.7)
NEUTROS PCT: 55.4 % (ref 43.0–77.0)
PLATELETS: 335 10*3/uL (ref 150.0–400.0)
RBC: 4.57 Mil/uL (ref 3.87–5.11)
RDW: 14 % (ref 11.5–15.5)
WBC: 8 10*3/uL (ref 4.0–10.5)

## 2017-05-10 NOTE — Telephone Encounter (Signed)
Patient will do cardiac CT at [facility]. New order placed. To scheduling to arrange.

## 2017-05-10 NOTE — Assessment & Plan Note (Signed)
HTN: Continue amlodipine, Lotensin. Check a CBC High cholesterol: Continue Pravachol, check LFTs, FLP. Colon polyps: Chart reviewed, had a colonoscopy 10-2011. Next colonoscopy per GI Emphysema: Essentially asx. Does not use inhalers frequently. Chest pain: Reports is a chronic problem, previously Myoview was negative, just had a calcium coronary score, results interpretation per cards. Osteoporosis: On Prolia, will check a BMP, will order a bone density test at the next visit. RTC 6 months. Recommend a Medicare wellness.

## 2017-05-10 NOTE — Telephone Encounter (Signed)
CT has been scheduled 7/30.

## 2017-05-13 ENCOUNTER — Encounter: Payer: Self-pay | Admitting: Cardiology

## 2017-05-16 ENCOUNTER — Telehealth: Payer: Self-pay | Admitting: Internal Medicine

## 2017-05-16 NOTE — Telephone Encounter (Signed)
Prolia benefits verified No PA required Admin and prolia should be covered 100%  Patient may owe approximately $0 OOP  Due after 06/16/17-Scheduled 06/18/17

## 2017-05-16 NOTE — Telephone Encounter (Signed)
Tricia- can you order Prolia for Pt? Thank you.  

## 2017-05-20 DIAGNOSIS — H524 Presbyopia: Secondary | ICD-10-CM | POA: Diagnosis not present

## 2017-05-20 DIAGNOSIS — H10433 Chronic follicular conjunctivitis, bilateral: Secondary | ICD-10-CM | POA: Diagnosis not present

## 2017-05-20 DIAGNOSIS — H33321 Round hole, right eye: Secondary | ICD-10-CM | POA: Diagnosis not present

## 2017-05-22 NOTE — Telephone Encounter (Signed)
Prolia in fridge

## 2017-05-22 NOTE — Telephone Encounter (Signed)
Nurse visit scheduled for 06/18/2017 for Prolia.

## 2017-05-27 ENCOUNTER — Encounter (HOSPITAL_COMMUNITY): Payer: Self-pay

## 2017-05-27 ENCOUNTER — Ambulatory Visit (HOSPITAL_COMMUNITY): Admission: RE | Admit: 2017-05-27 | Payer: Medicare Other | Source: Ambulatory Visit

## 2017-05-27 ENCOUNTER — Ambulatory Visit (HOSPITAL_COMMUNITY)
Admission: RE | Admit: 2017-05-27 | Discharge: 2017-05-27 | Disposition: A | Discharge status: Home / Self Care | Payer: Medicare Other | Source: Ambulatory Visit | Attending: Cardiology | Admitting: Cardiology

## 2017-05-27 ENCOUNTER — Other Ambulatory Visit: Payer: Self-pay | Admitting: Cardiology

## 2017-05-27 DIAGNOSIS — R079 Chest pain, unspecified: Secondary | ICD-10-CM | POA: Diagnosis not present

## 2017-05-27 DIAGNOSIS — I2510 Atherosclerotic heart disease of native coronary artery without angina pectoris: Secondary | ICD-10-CM

## 2017-05-27 DIAGNOSIS — Q249 Congenital malformation of heart, unspecified: Secondary | ICD-10-CM

## 2017-05-27 NOTE — Progress Notes (Signed)
Nurse and IV team unable to obtain IV for CT.  Patient rescheduled for 8/2 arrive at [facility] for 0800 IV start by IR Doctor. CT appointment at 0830.  Patient verbalized understanding.

## 2017-05-30 ENCOUNTER — Ambulatory Visit (HOSPITAL_COMMUNITY)
Admission: RE | Admit: 2017-05-30 | Discharge: 2017-05-30 | Disposition: A | Discharge status: Home / Self Care | Payer: Medicare Other | Source: Ambulatory Visit | Attending: Cardiology | Admitting: Cardiology

## 2017-05-30 ENCOUNTER — Other Ambulatory Visit: Payer: Self-pay | Admitting: Cardiology

## 2017-05-30 ENCOUNTER — Encounter (HOSPITAL_COMMUNITY): Payer: Self-pay | Admitting: Interventional Radiology

## 2017-05-30 DIAGNOSIS — Q249 Congenital malformation of heart, unspecified: Secondary | ICD-10-CM

## 2017-05-30 DIAGNOSIS — I2510 Atherosclerotic heart disease of native coronary artery without angina pectoris: Secondary | ICD-10-CM | POA: Insufficient documentation

## 2017-05-30 DIAGNOSIS — R079 Chest pain, unspecified: Secondary | ICD-10-CM | POA: Diagnosis not present

## 2017-05-30 DIAGNOSIS — I739 Peripheral vascular disease, unspecified: Secondary | ICD-10-CM | POA: Diagnosis not present

## 2017-05-30 HISTORY — PX: IR RADIOLOGY PERIPHERAL GUIDED IV START: IMG5598

## 2017-05-30 HISTORY — PX: IR US GUIDE VASC ACCESS RIGHT: IMG2390

## 2017-05-30 MED ORDER — METOPROLOL TARTRATE 5 MG/5ML IV SOLN
INTRAVENOUS | Status: AC
  Filled 2017-05-30: qty 10

## 2017-05-30 MED ORDER — LIDOCAINE HCL (PF) 1 % IJ SOLN
INTRAMUSCULAR | Status: AC
Start: 2017-05-30 — End: 2017-05-30
  Filled 2017-05-30: qty 30

## 2017-05-30 MED ORDER — IOPAMIDOL (ISOVUE-370) INJECTION 76%
INTRAVENOUS | Status: AC
  Administered 2017-05-30: 80 mL
  Filled 2017-05-30: qty 100

## 2017-05-30 MED ORDER — NITROGLYCERIN 0.4 MG SL SUBL: 0.8000 mg | SUBLINGUAL_TABLET | Freq: Once | SUBLINGUAL | Status: DC

## 2017-05-30 MED ORDER — METOPROLOL TARTRATE 5 MG/5ML IV SOLN
INTRAVENOUS | Status: AC
  Filled 2017-05-30: qty 5

## 2017-05-30 MED ORDER — METOPROLOL TARTRATE 5 MG/5ML IV SOLN
5.0000 mg | Freq: Once | INTRAVENOUS | Status: AC
  Administered 2017-05-30: 5 mg via INTRAVENOUS

## 2017-05-30 MED ORDER — NITROGLYCERIN 0.4 MG SL SUBL
SUBLINGUAL_TABLET | SUBLINGUAL | Status: AC
  Administered 2017-05-30: 0.8 mg
  Filled 2017-05-30: qty 1

## 2017-05-30 NOTE — Progress Notes (Signed)
Patient complains that micro puncture is painful below insertion site. Does not hurt more with flush. Melinda IR Tech came over to assess. Flushes well and good blood return. She feels it is safe to use for contrast, that maybe it is irritating a nerve.

## 2017-05-31 ENCOUNTER — Ambulatory Visit (HOSPITAL_COMMUNITY): Payer: Medicare Other | Attending: Cardiology

## 2017-05-31 ENCOUNTER — Other Ambulatory Visit: Payer: Self-pay

## 2017-05-31 DIAGNOSIS — I083 Combined rheumatic disorders of mitral, aortic and tricuspid valves: Secondary | ICD-10-CM | POA: Insufficient documentation

## 2017-05-31 DIAGNOSIS — I272 Pulmonary hypertension, unspecified: Secondary | ICD-10-CM | POA: Diagnosis not present

## 2017-05-31 DIAGNOSIS — I350 Nonrheumatic aortic (valve) stenosis: Secondary | ICD-10-CM | POA: Diagnosis not present

## 2017-06-06 ENCOUNTER — Telehealth: Payer: Self-pay | Admitting: Cardiology

## 2017-06-06 NOTE — Telephone Encounter (Signed)
-----   Message from Sueanne Margarita, MD sent at 05/30/2017  2:15 PM EDT ----- Coronary CTA showed moderate coronary artery calcifications at 48th% with < 50% prox LAD and mildly dilated aortic root at 3.9cm.  Please increase her pravastatin to 40mg  daily and repeat FLP and ALT in 6 weeks.  Her LDL goal is now < 70.  Continue ASA.  Please find out if she is still having intermittent CP.  Repeat 2D echo to assess for AS progression as well.

## 2017-06-06 NOTE — Telephone Encounter (Signed)
New message      Calling to get echo and CAT scan results

## 2017-06-06 NOTE — Telephone Encounter (Signed)
Informed patient of results and verbal understanding expressed.  Patient wishes to have OV with Dr. Radford Pax tomorrow prior to changing medications. She had ECHO 8/3.  She states she does not have any chest pain. She was grateful for call.

## 2017-06-07 ENCOUNTER — Ambulatory Visit (INDEPENDENT_AMBULATORY_CARE_PROVIDER_SITE_OTHER): Payer: Medicare Other | Admitting: Cardiology

## 2017-06-07 ENCOUNTER — Telehealth: Payer: Self-pay

## 2017-06-07 ENCOUNTER — Encounter: Payer: Self-pay | Admitting: Cardiology

## 2017-06-07 VITALS — BP 120/68 | HR 72 | Ht 66.0 in | Wt 158.0 lb

## 2017-06-07 DIAGNOSIS — I272 Pulmonary hypertension, unspecified: Secondary | ICD-10-CM

## 2017-06-07 DIAGNOSIS — I10 Essential (primary) hypertension: Secondary | ICD-10-CM | POA: Diagnosis not present

## 2017-06-07 DIAGNOSIS — I350 Nonrheumatic aortic (valve) stenosis: Secondary | ICD-10-CM | POA: Diagnosis not present

## 2017-06-07 DIAGNOSIS — E7800 Pure hypercholesterolemia, unspecified: Secondary | ICD-10-CM | POA: Diagnosis not present

## 2017-06-07 DIAGNOSIS — I2510 Atherosclerotic heart disease of native coronary artery without angina pectoris: Secondary | ICD-10-CM

## 2017-06-07 MED ORDER — PRAVASTATIN SODIUM 20 MG PO TABS: 20.0000 mg | ORAL_TABLET | Freq: Every day | ORAL | 3 refills | Status: DC

## 2017-06-07 MED ORDER — PRAVASTATIN SODIUM 40 MG PO TABS: 40.0000 mg | ORAL_TABLET | Freq: Every day | ORAL | 3 refills | Status: DC

## 2017-06-07 NOTE — Progress Notes (Signed)
Cardiology Office Note:    Date:  06/07/2017   ID:  Dominique [name], DOB July 07, 1941, MRN [id number]  PCP:  Colon Branch, MD  Cardiologist:  Fransico Him, MD   Referring MD: Colon Branch, MD   Chief Complaint  Patient presents with  . Aortic Stenosis  . Hypertension    History of Present Illness:    Dominique [name] is a 76 y.o. female with a hx of with a history of moderate AS, HTN, moderate pulmonary HTN with PASP of 61mmHg, moderate TR and mildly dilated RV. She also has a history of severe COPD and coronary artery calcifications on chest CT with no ischemia on nuclear stress test a year ago.  She recently had a coronary CTA which showed a calcium score of 64 and <50% calcified LAD stenosis in the proximal vessel and otherwise normal coronary arteries.  She also was noted to have a dilated aortic root at 3.9cm.    She is back today for followup and is doing well.  She has chronic SOB when she walks too fast but says it is stable and unchanged since I saw her last.  She denies any PND, orthopnea, dizziness, palpitations or syncope.  She has not had any chest pain or pressure.  She rarely will have some LE edema when it is really hot out.   Past Medical History:  Diagnosis Date  . Aortic stenosis, moderate    echo 04/2016  . Coronary artery calcification seen on CAT scan    no ischemia no nuclear stress test 2017  . Emphysema lung ([place])   . History of blood transfusion 1971   with C-section complication  . Hyperlipidemia    LDL goal < 70  . Hypertension   . Pulmonary HTN ([place]) 06/05/2016   Moderate with PASP 72mmHg by echo 04/2016 likely Group 3 from COPD and possibly Group 2 from pulmonary venous HTN associated with moderate AS  . Sacral fracture ([place])   . UTI (lower urinary tract infection)     Past Surgical History:  Procedure Laterality Date  . APPENDECTOMY  1951  . CATARACT EXTRACTION    . CESAREAN SECTION     1971  . IR RADIOLOGY PERIPHERAL GUIDED IV START  05/30/2017   . IR US GUIDE VASC ACCESS RIGHT  05/30/2017  . JOINT REPLACEMENT  2016   sacroplasty  . LAMINECTOMY    . TOTAL HIP ARTHROPLASTY Bilateral   . TUBAL LIGATION  1975    Current Medications: Current Meds  Medication Sig  . acetaminophen (TYLENOL) 500 MG tablet Take 500 mg by mouth 2 (two) times daily as needed (pain). Reported on 04/24/2016  . albuterol (PROVENTIL HFA;VENTOLIN HFA) 108 (90 Base) MCG/ACT inhaler Inhale 2 puffs into the lungs every 6 (six) hours as needed for wheezing or shortness of breath.  Marland Kitchen amLODipine (NORVASC) 5 MG tablet Take 1 tablet (5 mg total) by mouth daily.  Marland Kitchen aspirin EC 81 MG tablet Take 81 mg by mouth daily.  . benazepril (LOTENSIN) 20 MG tablet Take 1 tablet (20 mg total) by mouth daily.  . Calcium Carb-Cholecalciferol (CALCIUM 600 + D PO) Take 600 mg by mouth daily.  . Cholecalciferol (VITAMIN D3) 5000 UNITS CAPS Take 5,000 Units by mouth daily.   Marland Kitchen denosumab (PROLIA) 60 MG/ML SOLN injection Inject 60 mg into the skin every 6 (six) months. Administer in upper arm, thigh, or abdomen  . Multiple Vitamin (MULTIVITAMIN WITH MINERALS) TABS tablet Take 1 tablet by  mouth daily.  . pravastatin (PRAVACHOL) 20 MG tablet Take 1 tablet (20 mg total) by mouth daily.   Current Facility-Administered Medications for the 06/07/17 encounter (Office Visit) with Sueanne Margarita, MD  Medication  . 0.9 %  sodium chloride infusion     Allergies:   Patient has no known allergies.   Social History   Social History  . Marital status: Widowed    Spouse name: N/A  . Number of children: 2  . Years of education: N/A   Occupational History  . n/a    Social History Main Topics  . Smoking status: Former Smoker    Packs/day: 1.00    Years: 40.00    Types: Cigarettes    Quit date: 03/19/2004  . Smokeless tobacco: Never Used  . Alcohol use 1.8 - 2.4 oz/week    3 - 4 Glasses of wine per week     Comment: socially  . Drug use: No  . Sexual activity: Not Asked   Other Topics  Concern  . None   Social History Narrative   From [place]   1 living son     Family History: The patient's family history includes Breast cancer in her mother; Cancer in her brother; Diabetes in her son. There is no history of Colon cancer, CAD, Colon polyps, Rectal cancer, or Stomach cancer.  ROS:   Please see the history of present illness.     All other systems reviewed and are negative.  EKGs/Labs/Other Studies Reviewed:    The following studies were reviewed today: none  EKG:  EKG is  ordered today and showed NSR at 72bpm with no ST changes  Recent Labs: 05/10/2017: ALT 18; BUN 26; Creatinine, Ser 0.84; Hemoglobin 14.0; Platelets 335.0; Potassium 4.2; Sodium 138   Recent Lipid Panel    Component Value Date/Time   CHOL 203 (H) 05/10/2017 0927   TRIG 69.0 05/10/2017 0927   HDL 70.50 05/10/2017 0927   CHOLHDL 3 05/10/2017 0927   VLDL 13.8 05/10/2017 0927   LDLCALC 119 (H) 05/10/2017 0927    Physical Exam:    VS:  BP 120/68   Pulse 72   Ht 5\' 6"  (1.676 m)   Wt 158 lb (71.7 kg)   BMI 25.50 kg/m     Wt Readings from Last 3 Encounters:  06/07/17 158 lb (71.7 kg)  05/09/17 159 lb 4 oz (72.2 kg)  02/20/17 159 lb (72.1 kg)     GEN:  Well nourished, well developed in no acute distress HEENT: Normal NECK: No JVD; No carotid bruits LYMPHATICS: No lymphadenopathy CARDIAC: RRR, no rubs, gallops.  2/6 SM mid peaking with normal S1 and S2 RESPIRATORY:  Clear to auscultation without rales, wheezing or rhonchi  ABDOMEN: Soft, non-tender, non-distended MUSCULOSKELETAL:  No edema; No deformity  SKIN: Warm and dry NEUROLOGIC:  Alert and oriented x 3 PSYCHIATRIC:  Normal affect   ASSESSMENT:    1. Nonrheumatic aortic valve stenosis   2. Essential hypertension   3. Pulmonary HTN ([place])   4. Coronary artery calcification seen on CAT scan   5. Pure hypercholesterolemia    PLAN:    In order of problems listed above:  1.  Moderate aortic stenosis - recent 2D ehco  showed stable moderate AS with a mean AV gradient of 72mmHg and a peak velocity of 93m/s with normal LVF (EF60%) and normal LV dimensions.  She is asymptomatic.  I will repeat an echo in 1 year.  2.  HTN - her  BP is well controlled on exam today.  She will continue on amlodipine 5mg  daily, Benazepril 20mg  daily.  3.  Pulmonary HTN - her PASP was within normal range on echo at 8mmHg.   4.  Mild to moderate nonobstructive CAD with <50% prox LAD stenosis by coronary CTA.  She needs aggressive risk factor modification.  She will continue on ASA 81mg  daily and statin.   5.  Hyperlipidemia with LDL goal < 70.  Her last LDL was 119 last month so I will increase her Pravachol to 40mg  daily and repeat an FLP and ALT in 6 weeks.    Medication Adjustments/Labs and Tests Ordered: Current medicines are reviewed at length with the patient today.  Concerns regarding medicines are outlined above.  No orders of the defined types were placed in this encounter.  No orders of the defined types were placed in this encounter.   Signed, Fransico Him, MD  06/07/2017 9:44 AM    [place]

## 2017-06-07 NOTE — Telephone Encounter (Signed)
Patient was seen in clinic today. Repeat echo ordered to be scheduled in 1 year.

## 2017-06-07 NOTE — Patient Instructions (Signed)
Medication Instructions:  1) INCREASE PRAVASTATIN to 40 mg daily  Labwork: Your physician recommends that you return for FASTING lab work in 6 weeks.  Testing/Procedures: None  Follow-Up: Your physician wants you to follow-up in: 6 months with Dr. Theodosia Blender assistant. You will receive a reminder letter in the mail two months in advance. If you don't receive a letter, please call our office to schedule the follow-up appointment.   Your physician wants you to follow-up in: 1 year with Dr. Radford Pax. You will receive a reminder letter in the mail two months in advance. If you don't receive a letter, please call our office to schedule the follow-up appointment.   Any Other Special Instructions Will Be Listed Below (If Applicable).     If you need a refill on your cardiac medications before your next appointment, please call your pharmacy.

## 2017-06-07 NOTE — Telephone Encounter (Signed)
-----   Message from Sueanne Margarita, MD sent at 06/06/2017  5:56 PM EDT ----- Echo showed normal LVF with moderate AS - repeat echo in 1 year

## 2017-06-18 ENCOUNTER — Ambulatory Visit (INDEPENDENT_AMBULATORY_CARE_PROVIDER_SITE_OTHER): Payer: Medicare Other

## 2017-06-18 DIAGNOSIS — M810 Age-related osteoporosis without current pathological fracture: Secondary | ICD-10-CM

## 2017-06-18 MED ORDER — DENOSUMAB 60 MG/ML ~~LOC~~ SOLN
60.0000 mg | Freq: Once | SUBCUTANEOUS | Status: AC
  Administered 2017-06-18: 60 mg via SUBCUTANEOUS

## 2017-06-18 NOTE — Progress Notes (Addendum)
Pre visit review using our clinic tool,if applicable. No additional management support is needed unless otherwise documented below in the visit note.   Patient in for Prolia injection per order from Dr. Kathlene November. Patient has has a sacral fracture.  No complaints voiced by patient today. Given 60 mg SQ left arm. Patient tolerated well.  Advised patient that she will be called for next appointment in app 6 months.  Patient voiced understanding.  Kathlene November, MD

## 2017-07-18 DIAGNOSIS — Z23 Encounter for immunization: Secondary | ICD-10-CM | POA: Diagnosis not present

## 2017-07-26 ENCOUNTER — Other Ambulatory Visit: Payer: Medicare Other | Admitting: *Deleted

## 2017-07-26 DIAGNOSIS — I2510 Atherosclerotic heart disease of native coronary artery without angina pectoris: Secondary | ICD-10-CM

## 2017-07-26 DIAGNOSIS — E7800 Pure hypercholesterolemia, unspecified: Secondary | ICD-10-CM | POA: Diagnosis not present

## 2017-07-26 LAB — LIPID PANEL
CHOLESTEROL TOTAL: 185 mg/dL (ref 100–199)
Chol/HDL Ratio: 2.8 ratio (ref 0.0–4.4)
HDL: 66 mg/dL (ref >39)
LDL CALC: 103 mg/dL — AB (ref 0–99)
Triglycerides: 79 mg/dL (ref 0–149)
VLDL CHOLESTEROL CAL: 16 mg/dL (ref 5–40)

## 2017-07-26 LAB — HEPATIC FUNCTION PANEL
ALT: 17 IU/L (ref 0–32)
AST: 25 IU/L (ref 0–40)
Albumin: 4.1 g/dL (ref 3.5–4.8)
Alkaline Phosphatase: 55 IU/L (ref 39–117)
BILIRUBIN TOTAL: 0.3 mg/dL (ref 0.0–1.2)
BILIRUBIN, DIRECT: 0.09 mg/dL (ref 0.00–0.40)
TOTAL PROTEIN: 6.8 g/dL (ref 6.0–8.5)

## 2017-07-29 ENCOUNTER — Other Ambulatory Visit: Payer: Self-pay | Admitting: *Deleted

## 2017-07-29 DIAGNOSIS — E7800 Pure hypercholesterolemia, unspecified: Secondary | ICD-10-CM

## 2017-07-29 MED ORDER — PRAVASTATIN SODIUM 80 MG PO TABS: 80.0000 mg | ORAL_TABLET | Freq: Every evening | ORAL | 3 refills | Status: DC

## 2017-09-10 ENCOUNTER — Other Ambulatory Visit: Payer: Medicare Other

## 2017-09-10 DIAGNOSIS — E7800 Pure hypercholesterolemia, unspecified: Secondary | ICD-10-CM | POA: Diagnosis not present

## 2017-09-10 LAB — LIPID PANEL
Chol/HDL Ratio: 2.5 ratio (ref 0.0–4.4)
Cholesterol, Total: 182 mg/dL (ref 100–199)
HDL: 74 mg/dL (ref >39)
LDL Calculated: 92 mg/dL (ref 0–99)
Triglycerides: 81 mg/dL (ref 0–149)
VLDL Cholesterol Cal: 16 mg/dL (ref 5–40)

## 2017-09-10 LAB — ALT: ALT: 16 IU/L (ref 0–32)

## 2017-09-11 ENCOUNTER — Telehealth: Payer: Self-pay

## 2017-09-11 DIAGNOSIS — E785 Hyperlipidemia, unspecified: Secondary | ICD-10-CM

## 2017-09-11 MED ORDER — EZETIMIBE 10 MG PO TABS: 10.0000 mg | ORAL_TABLET | Freq: Every day | ORAL | 3 refills | Status: DC

## 2017-09-11 NOTE — Telephone Encounter (Signed)
Notes recorded by Teressa Senter, RN on 09/11/2017 at 12:12 PM EST Patient made aware of results. Instructed patient to start Zetia 10 mg daily and patient is scheduled for repeat FLT and ALT on 10/25/17. Patient verbalizes understanding and thanked me for the call.   ------  Notes recorded by Sueanne Margarita, MD on 09/11/2017 at 8:47 AM EST LDL not at goal - add Zetia 10mg  daily and repeat FLP and ALT in 6 weeks

## 2017-09-23 ENCOUNTER — Encounter: Payer: Self-pay | Admitting: Cardiovascular Disease

## 2017-09-23 ENCOUNTER — Ambulatory Visit (INDEPENDENT_AMBULATORY_CARE_PROVIDER_SITE_OTHER): Payer: Medicare Other | Admitting: Cardiovascular Disease

## 2017-09-23 VITALS — BP 140/78 | HR 82 | Ht 65.5 in | Wt 157.2 lb

## 2017-09-23 DIAGNOSIS — I272 Pulmonary hypertension, unspecified: Secondary | ICD-10-CM

## 2017-09-23 DIAGNOSIS — R0602 Shortness of breath: Secondary | ICD-10-CM | POA: Diagnosis not present

## 2017-09-23 DIAGNOSIS — Z79899 Other long term (current) drug therapy: Secondary | ICD-10-CM | POA: Diagnosis not present

## 2017-09-23 DIAGNOSIS — I259 Chronic ischemic heart disease, unspecified: Secondary | ICD-10-CM | POA: Diagnosis not present

## 2017-09-23 DIAGNOSIS — I2510 Atherosclerotic heart disease of native coronary artery without angina pectoris: Secondary | ICD-10-CM

## 2017-09-23 DIAGNOSIS — I350 Nonrheumatic aortic (valve) stenosis: Secondary | ICD-10-CM

## 2017-09-23 DIAGNOSIS — E7800 Pure hypercholesterolemia, unspecified: Secondary | ICD-10-CM | POA: Diagnosis not present

## 2017-09-23 DIAGNOSIS — I10 Essential (primary) hypertension: Secondary | ICD-10-CM | POA: Diagnosis not present

## 2017-09-23 DIAGNOSIS — E785 Hyperlipidemia, unspecified: Secondary | ICD-10-CM

## 2017-09-23 MED ORDER — ROSUVASTATIN CALCIUM 40 MG PO TABS: 40.0000 mg | ORAL_TABLET | Freq: Every day | ORAL | 3 refills | Status: DC

## 2017-09-23 NOTE — Patient Instructions (Signed)
Dr [name] has recommended making the following medication changes: 1. STOP Pravastatin 2. STOP Zetia 3. START Rosuvastatin 40 mg - take 1 tablet by mouth daily  Your physician recommends that you return for lab work in 2 months - FASTING. Just before your follow-up appointment.   Dr [name] recommends that you schedule a follow-up appointment in 2 months.  If you need a refill on your cardiac medications before your next appointment, please call your pharmacy.

## 2017-09-23 NOTE — Progress Notes (Signed)
Cardiology Office Note   Date:  09/24/2017   ID:  CORTLYN CANNELL, DOB [birth year], MRN [id number]  PCP:  Colon Branch, MD  Cardiologist:   Fransico Him, MD  No chief complaint on file.     History of Present Illness: Dominique [name] is a 76 y.o. female with coronary calcification, moderate aortic stenosis, moderate TR, mild pulmonary hypertension, hypertension, hyperlipidemia, and severe COPD here for a second opinion on hyperlipidemia.  Ms. Ricci saw Dr. Radford Pax on 05/2017.  She was noted to have moderate aortic stenosis (peak velocity 3.1 m/s, mean gradient 21 mmHg).  She had a chest CT 11/2016 that showed coronary calcifications.  She subsequently had a coronary CT-A 05/2017 that revealed less than 50% plaque in the proximal LAD and a mild ascending aortic aneurysm (3.9 cm).  She was noted to have calcification of the aorta and aortic valve.  Echo 05/31/17 that revealed LVEF 60-65% with normal diastolic function.  She had moderate aortic stenosis with a peak velocity of 3.1 m/s and a mean gradient of 21 mmHg.  She also had mild mitral regurgitation.  She was instructed to increase pravastatin to 40 mg daily.  However her LDL remained above goal so this was subsequently increased to 80 mg daily.  At this point her LDL was still greater than 70 so ezetimibe was added to her regimen.  Ms. Lajeunesse presents today because she wants a second opinion on whether or not she needs all these cholesterol medications.  She has been feeling well and has no chest pain.  She has exertional dyspnea that is stable.  She denies lower extremity edema, orthopnea, PND, palpitations, lightheadedness, or dizziness.  Past Medical History:  Diagnosis Date  . Aortic stenosis, moderate    echo 04/2016  . Coronary artery calcification seen on CAT scan    no ischemia no nuclear stress test 2017  . Emphysema lung ([place])   . History of blood transfusion 1971   with C-section complication  . Hyperlipidemia    LDL goal <  70  . Hypertension   . Pulmonary HTN ([place]) 06/05/2016   Moderate with PASP 20mmHg by echo 04/2016 likely Group 3 from COPD and possibly Group 2 from pulmonary venous HTN associated with moderate AS  . Sacral fracture ([place])   . UTI (lower urinary tract infection)     Past Surgical History:  Procedure Laterality Date  . APPENDECTOMY  1951  . CATARACT EXTRACTION    . CESAREAN SECTION     1971  . IR RADIOLOGY PERIPHERAL GUIDED IV START  05/30/2017  . IR US GUIDE VASC ACCESS RIGHT  05/30/2017  . JOINT REPLACEMENT  2016   sacroplasty  . LAMINECTOMY    . TOTAL HIP ARTHROPLASTY Bilateral   . TUBAL LIGATION  1975     Current Outpatient Medications  Medication Sig Dispense Refill  . acetaminophen (TYLENOL) 500 MG tablet Take 500 mg by mouth 2 (two) times daily as needed (pain). Reported on 04/24/2016    . albuterol (PROVENTIL HFA;VENTOLIN HFA) 108 (90 Base) MCG/ACT inhaler Inhale 2 puffs into the lungs every 6 (six) hours as needed for wheezing or shortness of breath. 1 Inhaler 2  . amLODipine (NORVASC) 5 MG tablet Take 1 tablet (5 mg total) by mouth daily. 90 tablet 2  . aspirin EC 81 MG tablet Take 81 mg by mouth daily.    . benazepril (LOTENSIN) 20 MG tablet Take 1 tablet (20 mg total) by mouth daily.  90 tablet 2  . Calcium Carb-Cholecalciferol (CALCIUM 600 + D PO) Take 600 mg by mouth daily.    . Cholecalciferol (VITAMIN D3) 5000 UNITS CAPS Take 5,000 Units by mouth daily.     Marland Kitchen denosumab (PROLIA) 60 MG/ML SOLN injection Inject 60 mg into the skin every 6 (six) months. Administer in upper arm, thigh, or abdomen    . Multiple Vitamin (MULTIVITAMIN WITH MINERALS) TABS tablet Take 1 tablet by mouth daily.    . rosuvastatin (CRESTOR) 40 MG tablet Take 1 tablet (40 mg total) by mouth daily. 90 tablet 3   Current Facility-Administered Medications  Medication Dose Route Frequency Provider Last Rate Last Dose  . 0.9 %  sodium chloride infusion  500 mL Intravenous Continuous Nandigam, Venia Minks, MD         Allergies:   Patient has no known allergies.    Social History:  The patient  reports that she quit smoking about 13 years ago. Her smoking use included cigarettes. She has a 40.00 pack-year smoking history. she has never used smokeless tobacco. She reports that she drinks about 1.8 - 2.4 oz of alcohol per week. She reports that she does not use drugs.   Family History:  The patient's family history includes Breast cancer in her mother; Cancer in her brother; Diabetes in her son.    ROS:  Please see the history of present illness.   Otherwise, review of systems are positive for none.   All other systems are reviewed and negative.    PHYSICAL EXAM: VS:  BP 140/78   Pulse 82   Ht 5' 5.5" (1.664 m)   Wt 157 lb 3.2 oz (71.3 kg)   BMI 25.76 kg/m  , BMI Body mass index is 25.76 kg/m. GENERAL:  Well appearing HEENT:  Pupils equal round and reactive, fundi not visualized, oral mucosa unremarkable NECK:  No jugular venous distention, waveform within normal limits, carotid upstroke brisk and symmetric, no bruits, no thyromegaly LYMPHATICS:  No cervical adenopathy LUNGS:  Clear to auscultation bilaterally HEART:  RRR.  PMI not displaced or sustained,S1 and S2 within normal limits, no S3, no S4, no clicks, no rubs, III/VI systolic murmur at the LUSB.  ABD:  Flat, positive bowel sounds normal in frequency in pitch, no bruits, no rebound, no guarding, no midline pulsatile mass, no hepatomegaly, no splenomegaly EXT:  2 plus pulses throughout, no edema, no cyanosis no clubbing SKIN:  No rashes no nodules NEURO:  Cranial nerves II through XII grossly intact, motor grossly intact throughout PSYCH:  Cognitively intact, oriented to person place and time    EKG:  EKG is not ordered today.  Coronary CT-A 05/30/17: IMPRESSION: 1) Calcium score 64 which is 70 th percentile for age and sex  2) Left dominant coronary arteries with less than 50% calcified stenosis in proximal LAD  3) Aortic  root dilatation 3.9 cm  4) Aortic and Aortic valve calcification  Echo 05/31/17: Study Conclusions  - Left ventricle: The cavity size was normal. Systolic function was   normal. The estimated ejection fraction was in the range of 60%   to 65%. Wall motion was normal; there were no regional wall   motion abnormalities. Left ventricular diastolic function   parameters were normal. - Aortic valve: Severely calcified annulus. Trileaflet; mildly   thickened, moderately calcified leaflets. There was moderate   stenosis. Peak velocity (S): 309 cm/s. Mean gradient (S): 21 mm   Hg. Valve area (VTI): 0.86 cm^2. Valve area (Vmax):  0.89 cm^2.   Valve area (Vmean): 0.93 cm^2. - Mitral valve: There was mild regurgitation. - Pulmonary arteries: PA peak pressure: 34 mm Hg (S).  Recent Labs: 05/10/2017: BUN 26; Creatinine, Ser 0.84; Hemoglobin 14.0; Platelets 335.0; Potassium 4.2; Sodium 138 09/10/2017: ALT 16    Lipid Panel    Component Value Date/Time   CHOL 182 09/10/2017 0854   TRIG 81 09/10/2017 0854   HDL 74 09/10/2017 0854   CHOLHDL 2.5 09/10/2017 0854   CHOLHDL 3 05/10/2017 0927   VLDL 13.8 05/10/2017 0927   LDLCALC 92 09/10/2017 0854      Wt Readings from Last 3 Encounters:  09/23/17 157 lb 3.2 oz (71.3 kg)  06/07/17 158 lb (71.7 kg)  05/09/17 159 lb 4 oz (72.2 kg)      ASSESSMENT AND PLAN:  # Hyperlipidemia: I agree with Dr. Radford Pax that Ms. Point's LDL goal should be <70.  She doesn't want to be on so many medications.  Instead of adding ezetemibe to pravastatin we will start rosuvastatin 20mg  daily and repeat lipids and CMP in 6 weeks.  # Moderate aortic stenosis:  Mean gradient 21 mmHg.  Repeat echo 05/2018.  # Mild ascending aorta aneurysm: 3.9 cm.  Repeat echo 05/2018.  # Hypertension: BP is above goal today but has been generally well-controlled.  She hasn't taken her meds yet.  Continue amlodipine and benazepril. Will reassess at follow up.    # Asymptomatic  coronary calcification:  Continue aspirin and starting rosuvastatin as above.  Recommended that she increase her exercise.   # Mild pulmonary hypertension: Likely 2/2 COPD.  She is euvolemic.   Current medicines are reviewed at length with the patient today.  The patient does not have concerns regarding medicines.  The following changes have been made: Stop pravastatin and Zetia.  Start rosuvastatin.  Labs/ tests ordered today include:   Orders Placed This Encounter  Procedures  . Lipid panel  . Comprehensive metabolic panel     Disposition:   FU with Tank Difiore C. [name], MD, [facility] in 2 months.   Ms. Governale requests to follow up with me in the future.   This note was written with the assistance of speech recognition software.  Please excuse any transcriptional errors.  Signed, Liannah Yarbough C. [name], MD, [facility]  09/24/2017 9:30 AM    [facility]

## 2017-09-24 ENCOUNTER — Encounter: Payer: Self-pay | Admitting: Cardiovascular Disease

## 2017-10-03 ENCOUNTER — Other Ambulatory Visit: Payer: Self-pay | Admitting: Internal Medicine

## 2017-10-03 ENCOUNTER — Other Ambulatory Visit: Payer: Self-pay

## 2017-10-25 ENCOUNTER — Other Ambulatory Visit: Payer: Medicare Other

## 2017-11-02 ENCOUNTER — Other Ambulatory Visit: Payer: Self-pay | Admitting: Internal Medicine

## 2017-11-13 ENCOUNTER — Ambulatory Visit (INDEPENDENT_AMBULATORY_CARE_PROVIDER_SITE_OTHER): Payer: Medicare Other | Admitting: Internal Medicine

## 2017-11-13 ENCOUNTER — Encounter: Payer: Self-pay | Admitting: Internal Medicine

## 2017-11-13 VITALS — BP 126/58 | HR 58 | Temp 97.8°F | Resp 14 | Ht 65.5 in | Wt 159.0 lb

## 2017-11-13 DIAGNOSIS — I10 Essential (primary) hypertension: Secondary | ICD-10-CM

## 2017-11-13 DIAGNOSIS — E7800 Pure hypercholesterolemia, unspecified: Secondary | ICD-10-CM

## 2017-11-13 DIAGNOSIS — J449 Chronic obstructive pulmonary disease, unspecified: Secondary | ICD-10-CM | POA: Diagnosis not present

## 2017-11-13 LAB — COMPREHENSIVE METABOLIC PANEL
ALT: 23 U/L (ref 0–35)
AST: 31 U/L (ref 0–37)
Albumin: 4.2 g/dL (ref 3.5–5.2)
Alkaline Phosphatase: 39 U/L (ref 39–117)
BUN: 15 mg/dL (ref 6–23)
CHLORIDE: 101 meq/L (ref 96–112)
CO2: 30 mEq/L (ref 19–32)
CREATININE: 0.74 mg/dL (ref 0.40–1.20)
Calcium: 9.6 mg/dL (ref 8.4–10.5)
GFR: 80.77 mL/min (ref >60.00)
GLUCOSE: 85 mg/dL (ref 70–99)
Potassium: 4.3 mEq/L (ref 3.5–5.1)
SODIUM: 139 meq/L (ref 135–145)
Total Bilirubin: 0.6 mg/dL (ref 0.2–1.2)
Total Protein: 7.6 g/dL (ref 6.0–8.3)

## 2017-11-13 LAB — LIPID PANEL
CHOL/HDL RATIO: 2
Cholesterol: 146 mg/dL (ref 0–200)
HDL: 71.2 mg/dL (ref >39.00)
LDL CALC: 62 mg/dL (ref 0–99)
NONHDL: 75.06
Triglycerides: 66 mg/dL (ref 0.0–149.0)
VLDL: 13.2 mg/dL (ref 0.0–40.0)

## 2017-11-13 MED ORDER — AZITHROMYCIN 250 MG PO TABS: ORAL_TABLET | ORAL | 0 refills | Status: DC

## 2017-11-13 NOTE — Progress Notes (Signed)
Pre visit review using our clinic review tool, if applicable. No additional management support is needed unless otherwise documented below in the visit note. 

## 2017-11-13 NOTE — Progress Notes (Signed)
Subjective:    Patient ID: Dominique [name], female    DOB: [birth year], 77 y.o.   MRN: [id number]  DOS:  11/13/2017 Type of visit - description : rov Interval history: In general feeling well Has "something" at the R chest, ?rash, no problems w/ her breast but thinks problem started after a MMG HTN: Good compliance with medication, ambulatory BPs normal when checked Good compliance with Prolia History of emphysema, would like a Z-Pak prescribed because she is going on a cruise and  often times she gets sick while in there.  Currently asymptomatic, has not used albuterol in a long time.   Review of Systems   Past Medical History:  Diagnosis Date  . Aortic stenosis, moderate    echo 04/2016  . Coronary artery calcification seen on CAT scan    no ischemia no nuclear stress test 2017  . Emphysema lung ([place])   . History of blood transfusion 1971   with C-section complication  . Hyperlipidemia    LDL goal < 70  . Hypertension   . Pulmonary HTN ([place]) 06/05/2016   Moderate with PASP 14mmHg by echo 04/2016 likely Group 3 from COPD and possibly Group 2 from pulmonary venous HTN associated with moderate AS  . Sacral fracture ([place])   . UTI (lower urinary tract infection)     Past Surgical History:  Procedure Laterality Date  . APPENDECTOMY  1951  . CATARACT EXTRACTION    . CESAREAN SECTION     1971  . IR RADIOLOGY PERIPHERAL GUIDED IV START  05/30/2017  . IR US GUIDE VASC ACCESS RIGHT  05/30/2017  . JOINT REPLACEMENT  2016   sacroplasty  . LAMINECTOMY    . TOTAL HIP ARTHROPLASTY Bilateral   . TUBAL LIGATION  1975    Social History   Socioeconomic History  . Marital status: Widowed    Spouse name: Not on file  . Number of children: 2  . Years of education: Not on file  . Highest education level: Not on file  Social Needs  . Financial resource strain: Not on file  . Food insecurity - worry: Not on file  . Food insecurity - inability: Not on file  . Transportation needs -  medical: Not on file  . Transportation needs - non-medical: Not on file  Occupational History  . Occupation: n/a  Tobacco Use  . Smoking status: Former Smoker    Packs/day: 1.00    Years: 40.00    Pack years: 40.00    Types: Cigarettes    Last attempt to quit: 03/19/2004    Years since quitting: 13.6  . Smokeless tobacco: Never Used  Substance and Sexual Activity  . Alcohol use: Yes    Alcohol/week: 1.8 - 2.4 oz    Types: 3 - 4 Glasses of wine per week    Comment: socially  . Drug use: No  . Sexual activity: Not on file  Other Topics Concern  . Not on file  Social History Narrative   From [place]   1 living son      Allergies as of 11/13/2017   No Known Allergies     Medication List        Accurate as of 11/13/17 10:56 AM. Always use your most recent med list.          acetaminophen 500 MG tablet Commonly known as:  TYLENOL Take 500 mg by mouth 2 (two) times daily as needed (pain). Reported on 04/24/2016   albuterol  108 (90 Base) MCG/ACT inhaler Commonly known as:  PROVENTIL HFA;VENTOLIN HFA Inhale 2 puffs into the lungs every 6 (six) hours as needed for wheezing or shortness of breath.   amLODipine 5 MG tablet Commonly known as:  NORVASC Take 1 tablet (5 mg total) by mouth daily.   aspirin EC 81 MG tablet Take 81 mg by mouth daily.   benazepril 20 MG tablet Commonly known as:  LOTENSIN Take 1 tablet (20 mg total) by mouth daily.   CALCIUM 600 + D PO Take 600 mg by mouth daily.   denosumab 60 MG/ML Soln injection Commonly known as:  PROLIA Inject 60 mg into the skin every 6 (six) months. Administer in upper arm, thigh, or abdomen   multivitamin with minerals Tabs tablet Take 1 tablet by mouth daily.   rosuvastatin 40 MG tablet Commonly known as:  CRESTOR Take 1 tablet (40 mg total) by mouth daily.   Vitamin D3 5000 units Caps Take 5,000 Units by mouth daily.          Objective:   Physical Exam BP (!) 126/58 (BP Location: Left Arm, Patient  Position: Sitting, Cuff Size: Small)   Pulse (!) 58   Temp 97.8 F (36.6 C) (Oral)   Resp 14   Ht 5' 5.5" (1.664 m)   Wt 159 lb (72.1 kg)   SpO2 97%   BMI 26.06 kg/m  General:   Well developed, well nourished . NAD.  HEENT:  Normocephalic . Face symmetric, atraumatic Neck: No LAD/mass Chest wall: normal to inspection, palpation. Breast exam-axilary areas : wnl bilaterally   Lungs:  CTA B Normal respiratory effort, no intercostal retractions, no accessory muscle use. Heart: RRR,  no murmur.  No pretibial edema bilaterally  Skin: Not pale. Not jaundice Neurologic:  alert & oriented X3.  Speech normal, gait appropriate for age and unassisted Psych--  Cognition and judgment appear intact.  Cooperative with normal attention span and concentration.  Behavior appropriate. No anxious or depressed appearing.      Assessment & Plan:   Assessment HTN Hyperlipidemia PULM: --Former smoker, quit 2006 --H/o Emphysema, remote -- CT chest angio  05-2016---> pulm nodule x2,reticular infiltrate ; f/u CT 11/2016 stable nodule, no f/u -- CT chest angio  05-2016--->  enlarged R P.A. and Ao ; hac CT coronary 05/2017, f/u per cards CV: ---H/o murmur (since childhood) --Aortic stenosis: ECHO 7-17, AoS, next echo 05/2018 --DOE: (-) myoview  8-017, CT angio chest 05-2016:  no PE (see above) MSK --DJD --Sacral Fx, sacral insufficiency, s/p sacroplasty (radiology) 07-2015 --H/o osteoporosis :  took fosamax while in [place], Fosamax rx again by ortho after sacral Fx 07-2015, never took it, s/p #1 prolia 11-14-2015 --T score 08-2015 >> normal (likely normal d/t DJD?) H/o vit D def: Normal level 09-2015   PLAN  HTN: Seems controlled on amlodipine, Lotensin, check a CMP Hyperlipidemia: Was switch from Pravachol to Crestor, good compliance and tolerance.  Patient asked to do blood work today because she is leaving town.  Will do an forward to cardiology. Chest wall concern.  The patient had  "something" at the right upper chest, not clear if it was a rash, today she could not find it on self-exam and my clinical exam is negative.  Rx observation. Emphysema: Request a Z-Pak, going on a cruise and she is afraid she will get bronchitis.  Z-Pak printed, to use only if she has respiratory symptoms.  See AVS Most recent CTs and echo  reviewed. RTC  CPX 6 months

## 2017-11-13 NOTE — Patient Instructions (Signed)
GO TO THE LAB : Get the blood work     GO TO THE FRONT DESK Schedule your next appointment for a  Physical exam in 6 months   We are prescribing a Z-Pak.  Only use it if you have cough with a lot of green phlegm.  Also use your inhaler.  Continue checking the right side of your chest, if you have any concerns please come back.

## 2017-11-14 NOTE — Assessment & Plan Note (Signed)
PLAN  HTN: Seems controlled on amlodipine, Lotensin, check a CMP Hyperlipidemia: Was switch from Pravachol to Crestor, good compliance and tolerance.  Patient asked to do blood work today because she is leaving town.  Will do an forward to cardiology. Chest wall concern.  The patient had "something" at the right upper chest, not clear if it was a rash, today she could not find it on self-exam and my clinical exam is negative.  Rx observation. Emphysema: Request a Z-Pak, going on a cruise and she is afraid she will get bronchitis.  Z-Pak printed, to use only if she has respiratory symptoms.  See AVS Most recent CTs and echo  reviewed. RTC CPX 6 months

## 2017-11-21 ENCOUNTER — Telehealth: Payer: Self-pay | Admitting: Internal Medicine

## 2017-11-21 NOTE — Telephone Encounter (Signed)
Prolia benefits received PA not required $185 ded 20% co-insurance Covered 100% by supplement   Patient may owe approximately $0 OOP  Patient due after 12/15/17  Letter mailed to inform patient of benefits and to schedule

## 2017-12-01 DIAGNOSIS — L509 Urticaria, unspecified: Secondary | ICD-10-CM | POA: Diagnosis not present

## 2017-12-01 DIAGNOSIS — L282 Other prurigo: Secondary | ICD-10-CM | POA: Diagnosis not present

## 2017-12-01 DIAGNOSIS — T781XXA Other adverse food reactions, not elsewhere classified, initial encounter: Secondary | ICD-10-CM | POA: Diagnosis not present

## 2017-12-01 DIAGNOSIS — T7840XA Allergy, unspecified, initial encounter: Secondary | ICD-10-CM | POA: Diagnosis not present

## 2017-12-03 ENCOUNTER — Ambulatory Visit: Payer: Medicare Other | Admitting: Cardiovascular Disease

## 2017-12-13 ENCOUNTER — Ambulatory Visit (INDEPENDENT_AMBULATORY_CARE_PROVIDER_SITE_OTHER): Payer: Medicare Other | Admitting: Physician Assistant

## 2017-12-13 ENCOUNTER — Encounter: Payer: Self-pay | Admitting: Physician Assistant

## 2017-12-13 VITALS — BP 102/66 | HR 96 | Ht 66.0 in | Wt 159.0 lb

## 2017-12-13 DIAGNOSIS — I10 Essential (primary) hypertension: Secondary | ICD-10-CM

## 2017-12-13 DIAGNOSIS — I350 Nonrheumatic aortic (valve) stenosis: Secondary | ICD-10-CM

## 2017-12-13 DIAGNOSIS — E785 Hyperlipidemia, unspecified: Secondary | ICD-10-CM

## 2017-12-13 DIAGNOSIS — I2510 Atherosclerotic heart disease of native coronary artery without angina pectoris: Secondary | ICD-10-CM | POA: Diagnosis not present

## 2017-12-13 NOTE — Patient Instructions (Addendum)
Rosaria Ferries, PA-C recommends that you continue on your current medications as directed. Please refer to the Current Medication list given to you today.  Your physician recommends that you return for lab work in 3 months - FASTING.  Suanne Marker recommends that you schedule a follow-up appointment in 3 months with Dr [name].  If you need a refill on your cardiac medications before your next appointment, please call your pharmacy.

## 2017-12-13 NOTE — Progress Notes (Signed)
Cardiology Office Note   Date:  12/13/2017   ID:  Dominique [name], DOB [birth year], MRN [id number]  PCP:  Colon Branch, MD  Cardiologist: Dr. [place],09/23/2017 (prev Dr Radford Pax) Rosaria Ferries, PA-C   Chief Complaint  Patient presents with  . Follow-up    pt denied chest pain and SOB    History of Present Illness: Dominique [name] is a 77 y.o. female with a history of mod AS w/ mean grad 21 mm Hg, coronary Ca++ w/ < 50% pLAD on cardiac CT, mod TR and mild MR, mild PAH, HTN, HLD, COPD, EF 60-65%, Asc Ao 3.9 cm  09/23/2017 office visit for a second opinion on hyperlipidemia medications, Zetia and pravastatin changed to Crestor 20 mg daily, blood pressure elevated but meds not taken yet, PAH felt secondary to COPD, repeat echo 05/2018  Dominique [name] presents for cardiology follow up.  On the cruise, she developed a rxn to [employer], now has EpiPen. Since she got back, has a little cold or her nose is running because of allergies, not bad. She has not seen an allergist or Dr Larose Kells since getting back.   She is tolerating the Crestor well and her LDL is down to 61, LFTs are ok. She is trying to eat more veggies and low cholesterol foods.    She does not get light-headed or dizzy. Her breathing is ok unless she is trying to walk fast and talk. She walks the dog but does not push it.  Her dyspnea on exertion has not changed recently.  She never gets chest pain with exertion.  Every few days, she will have a brief squeezing chest pain that resolves spontaneously in a very short period of time.  It is not exertional, she wonders if it is from weather changes.  She does not wake with lower extremity edema.  She denies orthopnea or PND.   She has no palpitations, no presyncope or syncope.   Past Medical History:  Diagnosis Date  . Aortic stenosis, moderate    echo 04/2016  . Coronary artery calcification seen on CAT scan    no ischemia no nuclear stress test 2017  . Emphysema lung ([place])     . History of blood transfusion 1971   with C-section complication  . Hyperlipidemia    LDL goal < 70  . Hypertension   . Pulmonary HTN ([place]) 06/05/2016   Moderate with PASP 66mmHg by echo 04/2016 likely Group 3 from COPD and possibly Group 2 from pulmonary venous HTN associated with moderate AS  . Sacral fracture ([place])   . UTI (lower urinary tract infection)     Past Surgical History:  Procedure Laterality Date  . APPENDECTOMY  1951  . CATARACT EXTRACTION    . CESAREAN SECTION     1971  . IR RADIOLOGY PERIPHERAL GUIDED IV START  05/30/2017  . IR US GUIDE VASC ACCESS RIGHT  05/30/2017  . JOINT REPLACEMENT  2016   sacroplasty  . LAMINECTOMY    . TOTAL HIP ARTHROPLASTY Bilateral   . TUBAL LIGATION  1975    Current Outpatient Medications  Medication Sig Dispense Refill  . amLODipine (NORVASC) 5 MG tablet Take 1 tablet (5 mg total) by mouth daily. 90 tablet 0  . aspirin EC 81 MG tablet Take 81 mg by mouth daily.    . benazepril (LOTENSIN) 20 MG tablet Take 1 tablet (20 mg total) by mouth daily. 90 tablet 1  . Calcium Carb-Cholecalciferol (CALCIUM 600 +  D PO) Take 600 mg by mouth daily.    . Cholecalciferol (VITAMIN D3) 5000 UNITS CAPS Take 5,000 Units by mouth daily.     Marland Kitchen denosumab (PROLIA) 60 MG/ML SOLN injection Inject 60 mg into the skin every 6 (six) months. Administer in upper arm, thigh, or abdomen    . EPINEPHrine 0.3 mg/0.3 mL IJ SOAJ injection USE 1 PEN AS DIRECTED  12  . Multiple Vitamin (MULTIVITAMIN WITH MINERALS) TABS tablet Take 1 tablet by mouth daily.    . rosuvastatin (CRESTOR) 40 MG tablet Take 1 tablet (40 mg total) by mouth daily. 90 tablet 3   Current Facility-Administered Medications  Medication Dose Route Frequency Provider Last Rate Last Dose  . 0.9 %  sodium chloride infusion  500 mL Intravenous Continuous Nandigam, Venia Minks, MD        Allergies:   Patient has no known allergies.    Social History:  The patient  reports that she quit smoking about 13  years ago. Her smoking use included cigarettes. She has a 40.00 pack-year smoking history. she has never used smokeless tobacco. She reports that she drinks about 1.8 - 2.4 oz of alcohol per week. She reports that she does not use drugs.   Family History:  The patient's family history includes Breast cancer in her mother; Cancer in her brother; Diabetes in her son.    ROS:  Please see the history of present illness. All other systems are reviewed and negative.    PHYSICAL EXAM: VS:  BP 102/66   Pulse 96   Ht 5\' 6"  (1.676 m)   Wt 159 lb (72.1 kg)   BMI 25.66 kg/m  , BMI Body mass index is 25.66 kg/m. GEN: Well nourished, well developed, female in no acute distress  HEENT: normal for age  Neck: no JVD, no carotid bruit, no masses Cardiac: RRR; 2/6 murmur, no rubs, or gallops Respiratory:  clear to auscultation bilaterally, normal work of breathing GI: soft, nontender, nondistended, + BS MS: no deformity or atrophy; no edema; distal pulses are 2+ in all 4 extremities   Skin: warm and dry, no rash Neuro:  Strength and sensation are intact Psych: euthymic mood, full affect   EKG:  EKG is ordered today. The ekg ordered today demonstrates sinus rhythm, heart rate 96, no pathologic Q waves, no acute ischemic changes and normal intervals  ECHO: 05/31/2017 - Left ventricle: The cavity size was normal. Systolic function was   normal. The estimated ejection fraction was in the range of 60%   to 65%. Wall motion was normal; there were no regional wall   motion abnormalities. Left ventricular diastolic function   parameters were normal. - Aortic valve: Severely calcified annulus. Trileaflet; mildly   thickened, moderately calcified leaflets. There was moderate   stenosis. Peak velocity (S): 309 cm/s. Mean gradient (S): 21 mm   Hg. Valve area (VTI): 0.86 cm^2. Valve area (Vmax): 0.89 cm^2.   Valve area (Vmean): 0.93 cm^2.   Mean gradient (S): 21 mm Hg. Peak gradient (S): 38 mm Hg. - Mitral  valve: There was mild regurgitation. - Pulmonary arteries: PA peak pressure: 34 mm Hg (S).  Recent Labs: 05/10/2017: Hemoglobin 14.0; Platelets 335.0 11/13/2017: ALT 23; BUN 15; Creatinine, Ser 0.74; Potassium 4.3; Sodium 139    Lipid Panel    Component Value Date/Time   CHOL 146 11/13/2017 1127   CHOL 182 09/10/2017 0854   TRIG 66.0 11/13/2017 1127   HDL 71.20 11/13/2017 1127   HDL  74 09/10/2017 0854   CHOLHDL 2 11/13/2017 1127   VLDL 13.2 11/13/2017 1127   LDLCALC 62 11/13/2017 1127   LDLCALC 92 09/10/2017 0854     Wt Readings from Last 3 Encounters:  12/13/17 159 lb (72.1 kg)  11/13/17 159 lb (72.1 kg)  09/23/17 157 lb 3.2 oz (71.3 kg)     Other studies Reviewed: Additional studies/ records that were reviewed today include: Office notes and testing.  ASSESSMENT AND PLAN:  1.  Nonobstructive coronary artery disease: She is having no ischemic symptoms.  She is on aspirin and a statin.  Blood pressure is well controlled, no med changes at this time  2.  Moderate aortic stenosis: She is having no symptoms attributable to this.  Repeat echo in August 2019.  3.  Hypertension: Her blood pressure is on the low side of normal today and her heart rate is a little elevated.  She admits that she does not drink very much water.  She is encouraged to drink a little more.  No med changes at this time but if her heart rate remains elevated, consider cutting back on the Lotensin and adding a beta-blocker  4.  Hyperlipidemia: She is tolerating the Crestor well.  She is a little bit concerned about side effects from statins, but is not having any.  I reassured her that her liver functions are completely normal and these would be followed periodically.  Recheck in 3 months, if labs are still okay, discussed with the patient increasing the interval between checks.   5.  Lobster allergy: She had an allergic reaction on her cruise and it was significant.  She is now afraid to eat all seafood.  I  encouraged her to follow-up with Dr. Larose Kells who will determine if she needs to see an allergist.  Current medicines are reviewed at length with the patient today.  The patient has concerns regarding medicines.  Concerns were addressed  The following changes have been made:  no change  Labs/ tests ordered today include:   Orders Placed This Encounter  Procedures  . Comprehensive metabolic panel  . Lipid panel  . EKG 12-Lead     Disposition:   FU with Dr. [name]   Signed, Rosaria Ferries, PA-C  12/13/2017 9:12 AM    [place] Phone: [phone]; Fax: [phone]  This note was written with the assistance of speech recognition software. Please excuse any transcriptional errors.

## 2017-12-14 ENCOUNTER — Telehealth: Payer: Self-pay | Admitting: Internal Medicine

## 2017-12-14 DIAGNOSIS — Z91013 Allergy to seafood: Secondary | ICD-10-CM

## 2017-12-14 NOTE — Telephone Encounter (Signed)
I reviewed the cardiology note, she had a severe allergy to lobster.  Recommend an allergist referral, please let patient know

## 2017-12-16 NOTE — Telephone Encounter (Signed)
Allergy referral placed- MyChart message sent informing Pt.

## 2017-12-17 ENCOUNTER — Ambulatory Visit (INDEPENDENT_AMBULATORY_CARE_PROVIDER_SITE_OTHER): Payer: Medicare Other | Admitting: *Deleted

## 2017-12-17 DIAGNOSIS — E2839 Other primary ovarian failure: Secondary | ICD-10-CM

## 2017-12-17 DIAGNOSIS — S3210XS Unspecified fracture of sacrum, sequela: Secondary | ICD-10-CM | POA: Diagnosis not present

## 2017-12-17 MED ORDER — DENOSUMAB 60 MG/ML ~~LOC~~ SOLN
60.0000 mg | Freq: Once | SUBCUTANEOUS | Status: AC
  Administered 2017-12-17: 60 mg via SUBCUTANEOUS

## 2017-12-17 NOTE — Progress Notes (Signed)
Pre visit review using our clinic review tool, if applicable. No additional management support is needed unless otherwise documented below in the visit note.   Pt here for prolia injection per order from PCP, Dr Larose Kells due to history of sacral fracture.  Last injection 06/18/17. Pt reports no side effects.   Injection given today and pt tolerated it well.  Pt advised that she will be contacted in around 6 months when next injection is due.

## 2018-01-10 NOTE — Telephone Encounter (Signed)
Appointment scheduled for 07/13/16.

## 2018-01-15 ENCOUNTER — Other Ambulatory Visit: Payer: Self-pay

## 2018-01-15 MED ORDER — AMLODIPINE BESYLATE 5 MG PO TABS: 5.0000 mg | ORAL_TABLET | Freq: Every day | ORAL | 2 refills | Status: DC

## 2018-01-29 ENCOUNTER — Encounter: Payer: Self-pay | Admitting: Allergy & Immunology

## 2018-01-29 ENCOUNTER — Ambulatory Visit (INDEPENDENT_AMBULATORY_CARE_PROVIDER_SITE_OTHER): Payer: Medicare Other | Admitting: Allergy & Immunology

## 2018-01-29 VITALS — BP 144/72 | HR 80 | Temp 97.8°F | Resp 20 | Ht 64.76 in | Wt 161.8 lb

## 2018-01-29 DIAGNOSIS — T7800XD Anaphylactic reaction due to unspecified food, subsequent encounter: Secondary | ICD-10-CM

## 2018-01-29 DIAGNOSIS — J301 Allergic rhinitis due to pollen: Secondary | ICD-10-CM | POA: Diagnosis not present

## 2018-01-29 DIAGNOSIS — J4520 Mild intermittent asthma, uncomplicated: Secondary | ICD-10-CM

## 2018-01-29 NOTE — Patient Instructions (Signed)
1. Mild intermittent asthma, uncomplicated - Lung testing did not look great, but there was some improvement with the albuterol. - However, since you are really not having any symptoms, I am not going to worry about treating a number from the breathing test. - Continue with the use of albuterol 2-4 puffs every 4-6 hours as needed with worsening respiratory symptoms.   2. Seasonal allergic rhinitis due to pollen - Testing today showed: weeds, grasses, cat and dog - Avoidance measures provided. - Start taking: Zyrtec (cetirizine) 10mg  tablet once daily and Nasacort (triamcinolone) one spray per nostril daily - You can use an extra dose of the antihistamine, if needed, for breakthrough symptoms.  - Consider nasal saline rinses 1-2 times daily to remove allergens from the nasal cavities as well as help with mucous clearance (this is especially helpful to do before the nasal sprays are given) - Consider allergy shots as a means of long-term control. - Allergy shots "re-train" and "reset" the immune system to ignore environmental allergens and decrease the resulting immune response to those allergens (sneezing, itchy watery eyes, runny nose, nasal congestion, etc).    - Allergy shots improve symptoms in 75-85% of patients.  - We can discuss more at the next appointment if the medications are not working for you.  3. Anaphylactic shock due to food (seafood) - All of the testing was negative to our fish and shellfish panel today. - We will get blood testing to confirm this.  - If the blood testing is negative, we will have you come in and watch you eat either mussels or lobster to rule this out. - EpiPen is up to date. - If the testing is negative, I am unsure what led to your reaction - maybe a spice of some sort - We could consider bringing you back in for skin testing to other foods, but since you are eating a wide variety of foods without a problem this might not be helpful.   4. Return in about  6 months (around 07/31/2018).   Please inform us of any Emergency Department visits, hospitalizations, or changes in symptoms. Call us before going to the ED for breathing or allergy symptoms since we might be able to fit you in for a sick visit. Feel free to contact us anytime with any questions, problems, or concerns.  It was a pleasure to meet you today!  Websites that have reliable patient information: 1. American Academy of Asthma, Allergy, and Immunology: www.aaaai.org 2. Food Allergy Research and Education (FARE): foodallergy.org 3. Mothers of Asthmatics: http://www.asthmacommunitynetwork.org 4. American College of Allergy, Asthma, and Immunology: www.acaai.org  Reducing Pollen Exposure  The American Academy of Allergy, Asthma and Immunology suggests the following steps to reduce your exposure to pollen during allergy seasons.    1. Do not hang sheets or clothing out to dry; pollen may collect on these items. 2. Do not mow lawns or spend time around freshly cut grass; mowing stirs up pollen. 3. Keep windows closed at night.  Keep car windows closed while driving. 4. Minimize morning activities outdoors, a time when pollen counts are usually at their highest. 5. Stay indoors as much as possible when pollen counts or humidity is high and on windy days when pollen tends to remain in the air longer. 6. Use air conditioning when possible.  Many air conditioners have filters that trap the pollen spores. 7. Use a HEPA room air filter to remove pollen form the indoor air you breathe.  Control of  Dog or Cat Allergen  Avoidance is the best way to manage a dog or cat allergy. If you have a dog or cat and are allergic to dog or cats, consider removing the dog or cat from the home. If you have a dog or cat but don't want to find it a new home, or if your family wants a pet even though someone in the household is allergic, here are some strategies that may help keep symptoms at bay:  1. Keep  the pet out of your bedroom and restrict it to only a few rooms. Be advised that keeping the dog or cat in only one room will not limit the allergens to that room. 2. Don't pet, hug or kiss the dog or cat; if you do, wash your hands with soap and water. 3. High-efficiency particulate air (HEPA) cleaners run continuously in a bedroom or living room can reduce allergen levels over time. 4. Regular use of a high-efficiency vacuum cleaner or a central vacuum can reduce allergen levels. 5. Giving your dog or cat a bath at least once a week can reduce airborne allergen.  Allergy Shots   Allergies are the result of a chain reaction that starts in the immune system. Your immune system controls how your body defends itself. For instance, if you have an allergy to pollen, your immune system identifies pollen as an invader or allergen. Your immune system overreacts by producing antibodies called Immunoglobulin E (IgE). These antibodies travel to cells that release chemicals, causing an allergic reaction.  The concept behind allergy immunotherapy, whether it is received in the form of shots or tablets, is that the immune system can be desensitized to specific allergens that trigger allergy symptoms. Although it requires time and patience, the payback can be long-term relief.  How Do Allergy Shots Work?  Allergy shots work much like a vaccine. Your body responds to injected amounts of a particular allergen given in increasing doses, eventually developing a resistance and tolerance to it. Allergy shots can lead to decreased, minimal or no allergy symptoms.  There generally are two phases: build-up and maintenance. Build-up often ranges from three to six months and involves receiving injections with increasing amounts of the allergens. The shots are typically given once or twice a week, though more rapid build-up schedules are sometimes used.  The maintenance phase begins when the most effective dose is reached.  This dose is different for each person, depending on how allergic you are and your response to the build-up injections. Once the maintenance dose is reached, there are longer periods between injections, typically two to four weeks.  Occasionally doctors give cortisone-type shots that can temporarily reduce allergy symptoms. These types of shots are different and should not be confused with allergy immunotherapy shots.  Who Can Be Treated with Allergy Shots?  Allergy shots may be a good treatment approach for people with allergic rhinitis (hay fever), allergic asthma, conjunctivitis (eye allergy) or stinging insect allergy.   Before deciding to begin allergy shots, you should consider:  . The length of allergy season and the severity of your symptoms . Whether medications and/or changes to your environment can control your symptoms . Your desire to avoid long-term medication use . Time: allergy immunotherapy requires a major time commitment . Cost: may vary depending on your insurance coverage  Allergy shots for children age 9 and older are effective and often well tolerated. They might prevent the onset of new allergen sensitivities or the progression to asthma.  Allergy shots are not started on patients who are pregnant but can be continued on patients who become pregnant while receiving them. In some patients with other medical conditions or who take certain common medications, allergy shots may be of risk. It is important to mention other medications you talk to your allergist.   When Will I Feel Better?  Some may experience decreased allergy symptoms during the build-up phase. For others, it may take as long as 12 months on the maintenance dose. If there is no improvement after a year of maintenance, your allergist will discuss other treatment options with you.  If you aren't responding to allergy shots, it may be because there is not enough dose of the allergen in your vaccine or  there are missing allergens that were not identified during your allergy testing. Other reasons could be that there are high levels of the allergen in your environment or major exposure to non-allergic triggers like tobacco smoke.  What Is the Length of Treatment?  Once the maintenance dose is reached, allergy shots are generally continued for three to five years. The decision to stop should be discussed with your allergist at that time. Some people may experience a permanent reduction of allergy symptoms. Others may relapse and a longer course of allergy shots can be considered.  What Are the Possible Reactions?  The two types of adverse reactions that can occur with allergy shots are local and systemic. Common local reactions include very mild redness and swelling at the injection site, which can happen immediately or several hours after. A systemic reaction, which is less common, affects the entire body or a particular body system. They are usually mild and typically respond quickly to medications. Signs include increased allergy symptoms such as sneezing, a stuffy nose or hives.  Rarely, a serious systemic reaction called anaphylaxis can develop. Symptoms include swelling in the throat, wheezing, a feeling of tightness in the chest, nausea or dizziness. Most serious systemic reactions develop within 30 minutes of allergy shots. This is why it is strongly recommended you wait in your doctor's office for 30 minutes after your injections. Your allergist is trained to watch for reactions, and his or her staff is trained and equipped with the proper medications to identify and treat them.  Who Should Administer Allergy Shots?  The preferred location for receiving shots is your prescribing allergist's office. Injections can sometimes be given at another facility where the physician and staff are trained to recognize and treat reactions, and have received instructions by your prescribing allergist.

## 2018-01-29 NOTE — Progress Notes (Signed)
NEW PATIENT  Date of Service/Encounter:  01/29/18  Referring provider: Colon Branch, MD   Assessment:   Mild intermittent asthma - with abnormal spirometric findings  Remote history of smoking  Seasonal and perennial allergic rhinitis (weeds, grasses, cat and dog)  Anaphylactic shock due to food (seafood) - with negative testing today  Plan/Recommendations:   1. Mild intermittent asthma, uncomplicated - Lung testing did not look great, but there was some improvement with the albuterol. - However, since you are really not having any symptoms, I am not going to worry about treating a number from the breathing test. - Continue with the use of albuterol 2-4 puffs every 4-6 hours as needed with worsening respiratory symptoms.   2. Seasonal and perennial allergic rhinitis - Testing today showed: weeds, grasses, cat and dog - Avoidance measures provided. - Start taking: Zyrtec (cetirizine) 10mg  tablet once daily and Nasacort (triamcinolone) one spray per nostril daily - You can use an extra dose of the antihistamine, if needed, for breakthrough symptoms.  - Consider nasal saline rinses 1-2 times daily to remove allergens from the nasal cavities as well as help with mucous clearance (this is especially helpful to do before the nasal sprays are given) - Consider allergy shots as a means of long-term control. - Allergy shots "re-train" and "reset" the immune system to ignore environmental allergens and decrease the resulting immune response to those allergens (sneezing, itchy watery eyes, runny nose, nasal congestion, etc).    - Allergy shots improve symptoms in 75-85% of patients.  - We can discuss more at the next appointment if the medications are not working for you.  3. Anaphylactic shock due to food (seafood) - All of the testing was negative to our fish and shellfish panel today. - We will get blood testing to confirm this.  - If the blood testing is negative, we will have you  come in and watch you eat either mussels or lobster to rule this out. - EpiPen is up to date. - If the testing is negative, I am unsure what led to your reaction - maybe a spice of some sort - We could consider bringing you back in for skin testing to other foods, but since you are eating a wide variety of foods without a problem this might not be helpful.   4. Return in about 6 months (around 07/31/2018).  Subjective:   Dominique [name] is a 77 y.o. female presenting today for evaluation of  Chief Complaint  Patient presents with  . Allergic Reaction    facial and throat swelling  . Allergic Rhinitis     sneezing and runny nose    Dominique [name] has a history of the following: Patient Active Problem List   Diagnosis Date Noted  . Coronary artery calcification seen on CAT scan   . Multiple lung nodules 06/15/2016  . Pulmonary HTN ([place]) 06/05/2016  . Aortic stenosis   . PCP NOTES >>>>>>> 10/15/2015  . Vitamin D deficiency 10/14/2015  . Sacral fracture ([place])   . Sciatica 06/08/2015  . Hip pain, bilateral 06/07/2015  . Dyspnea 04/12/2015  . History of colonic polyps 03/30/2015  . Leg edema, right 03/30/2015  . Hypocalcemia 12/27/2014  . Annual physical exam 09/27/2014  . Essential hypertension 03/19/2014  . Hyperlipidemia 03/19/2014  . COPD GOLD II 03/19/2014  . Family history of breast cancer in mother 03/19/2014    History obtained from: chart review and patient.  SAUL DORSI was referred  by Colon Branch, MD.     Dominique [name] is a 77 y.o. female presenting for an evaluation of possible food allergies and allergic rhinitis. She was on a cruise recently and had mussels and lobster. She developed ocular redness and swelling. She could hardly see out of her eyes. This lasted for three days and symptoms seemed to be getting worse. She never did seek help from the cruise physician. She felt as she was getting off of the cruise ship and she was having problems with throat closure. She  wen to Urgent Care upon debarking and was given two injections (steroid and possibly epinephrine). She was given an EpiPen prescription, which she did fill. This was January 2019. Prior to this, she was eating shellfish without a problem. She otherwise tolerates all of the major food allergens without adverse event.   Dominique [name] did have a lot of problems with wheezing when she was still smoking. She stopped smoking 11-12 years ago and has had improved symptoms since that time. She does have an inhaler but never uses it. She does not cough at night, but she does get a worse cough when she gets a cold. This is very rare overall.    Dominique [name] does report that she has rhinorrhea and ocular tearing since moving to [place] six years ago. Fall was particularly bad for her. She does not use any nose sprays. She has been using AllerClear (loratadine/pseudoepedrine). She was fine when she was in [place]. She does report some problems with ocular itching and swelling around her eyes. She does not have any eye drops at this point.   Otherwise, there is no history of other atopic diseases, including drug allergies, stinging insect allergies, or urticaria. There is no significant infectious history. Vaccinations are up to date.    Past Medical History: Patient Active Problem List   Diagnosis Date Noted  . Coronary artery calcification seen on CAT scan   . Multiple lung nodules 06/15/2016  . Pulmonary HTN ([place]) 06/05/2016  . Aortic stenosis   . PCP NOTES >>>>>>> 10/15/2015  . Vitamin D deficiency 10/14/2015  . Sacral fracture ([place])   . Sciatica 06/08/2015  . Hip pain, bilateral 06/07/2015  . Dyspnea 04/12/2015  . History of colonic polyps 03/30/2015  . Leg edema, right 03/30/2015  . Hypocalcemia 12/27/2014  . Annual physical exam 09/27/2014  . Essential hypertension 03/19/2014  . Hyperlipidemia 03/19/2014  . COPD GOLD II 03/19/2014  . Family history of breast cancer in mother 03/19/2014     Medication List:  Allergies as of 01/29/2018   No Known Allergies     Medication List        Accurate as of 01/29/18 10:15 PM. Always use your most recent med list.          amLODipine 5 MG tablet Commonly known as:  NORVASC Take 1 tablet (5 mg total) by mouth daily.   aspirin EC 81 MG tablet Take 81 mg by mouth daily.   benazepril 20 MG tablet Commonly known as:  LOTENSIN Take 1 tablet (20 mg total) by mouth daily.   CALCIUM 600 + D PO Take 600 mg by mouth daily.   denosumab 60 MG/ML Soln injection Commonly known as:  PROLIA Inject 60 mg into the skin every 6 (six) months. Administer in upper arm, thigh, or abdomen   EPINEPHrine 0.3 mg/0.3 mL Soaj injection Commonly known as:  EPI-PEN USE 1 PEN AS DIRECTED   multivitamin with minerals Tabs  tablet Take 1 tablet by mouth daily.   rosuvastatin 40 MG tablet Commonly known as:  CRESTOR Take 1 tablet (40 mg total) by mouth daily.   Vitamin D3 5000 units Caps Take 5,000 Units by mouth daily.       Birth History: non-contributory.   Developmental History: non-contributory.   Past Surgical History: Past Surgical History:  Procedure Laterality Date  . APPENDECTOMY  1951  . CATARACT EXTRACTION    . CESAREAN SECTION     1971  . IR RADIOLOGY PERIPHERAL GUIDED IV START  05/30/2017  . IR US GUIDE VASC ACCESS RIGHT  05/30/2017  . JOINT REPLACEMENT  2016   sacroplasty  . LAMINECTOMY    . TOTAL HIP ARTHROPLASTY Bilateral   . TUBAL LIGATION  1975     Family History: Family History  Problem Relation Age of Onset  . Breast cancer Mother        breast  . Diabetes Son        type 1  . Cancer Brother        lung  . Colon cancer Neg Hx   . CAD Neg Hx   . Colon polyps Neg Hx   . Rectal cancer Neg Hx   . Stomach cancer Neg Hx      Social History: Alayah lives at home with her husband. She was born in [place] but then came to the [place] for her husband's job in the early 1970s and never left. She currently lives in  a townhome. There was mildew damage in the home which was recently fixed in March 2019. They have hardwoods in the main living areas and vinvyl flooring in the bedroom. They have gas heating and central cooling. THere is one dog at home ("non-shedding" per the patient). There are dust mite coverings on the bedding. There is no tobacco exposure in the home. 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 She smoked for 62 years since the age of 49.     Review of Systems: a 14-point review of systems is pertinent for what is mentioned in HPI.  Otherwise, all other systems were negative. Constitutional: negative other than that listed in the HPI Eyes: negative other than that listed in the HPI Ears, nose, mouth, throat, and face: negative other than that listed in the HPI Respiratory: negative other than that listed in the HPI Cardiovascular: negative other than that listed in the HPI Gastrointestinal: negative other than that listed in the HPI Genitourinary: negative other than that listed in the HPI Integument: negative other than that listed in the HPI Hematologic: negative other than that listed in the HPI Musculoskeletal: negative other than that listed in the HPI Neurological: negative other than that listed in the HPI Allergy/Immunologic: negative other than that listed in the HPI    Objective:   Blood pressure (!) 144/72, pulse 80, temperature 97.8 F (36.6 C), temperature source Oral, resp. rate 20, height 5' 4.76" (1.645 m), weight 161 lb 12.8 oz (73.4 kg), SpO2 99 %. Body mass index is 27.12 kg/m.   Physical Exam:  General: Alert, interactive, in no acute distress. Pleasant female with a thick [place] accent.  Eyes: No conjunctival injection bilaterally, no discharge on the right, no discharge on the left and no Horner-Trantas dots present. PERRL  bilaterally. EOMI without pain. No photophobia.  Ears: Right TM pearly gray with normal light reflex, Left TM pearly gray with normal light reflex, Right TM intact without perforation and Left TM intact without perforation.  Nose/Throat: External nose within normal limits and septum midline. Turbinates edematous and pale with clear discharge. Posterior oropharynx erythematous with cobblestoning in the posterior oropharynx. Tonsils 2+ without exudates.  Tongue without thrush. Neck: Supple without thyromegaly. Trachea midline. Adenopathy: no enlarged lymph nodes appreciated in the anterior cervical, occipital, axillary, epitrochlear, inguinal, or popliteal regions. Lungs: Clear to auscultation without wheezing, rhonchi or rales. No increased work of breathing. CV: Normal S1/S2. No murmurs. Capillary refill <2 seconds.  Abdomen: Nondistended, nontender. No guarding or rebound tenderness. Bowel sounds present in all fields and hypoactive  Skin: Warm and dry, without lesions or rashes. Extremities:  No clubbing, cyanosis or edema. Neuro:   Grossly intact. No focal deficits appreciated. Responsive to questions.  Diagnostic studies:   Spirometry: results abnormal (FEV1: 0.92/43%, FVC: 1.74/61%, FEV1/FVC: 53%).    Spirometry consistent with mixed obstructive and restrictive disease. Albuterol nebulizer treatment given in clinic with improvement in FEV1 and FVC, but not significant per ATS criteria.  Allergy Studies:   Indoor/Outdoor Percutaneous Adult Environmental Panel: positive to [place] blue grass, meadow fescue grass, perennial rye grass, sweet vernal grass and timothy grass. Otherwise negative with adequate controls.  Indoor/Outdoor Selected Intradermal Environmental Panel: positive to [place] grass, [place] grass, ragweed mix, weed mix, cat and dog. Otherwise negative with adequate controls.  Selected Food Panel: negative to [employer] , Fish Mix, [place], [place], [place], [place], [place],  [place], [place], [place], [place], [place], [place] and [employer]   Allergy testing results were read and interpreted by myself, documented by clinical staff.     Salvatore Marvel, MD [facility] [place]

## 2018-02-13 ENCOUNTER — Telehealth: Payer: Self-pay | Admitting: Allergy & Immunology

## 2018-02-13 LAB — TRYPTASE: Tryptase: 4.9 ug/L (ref 2.2–13.2)

## 2018-02-13 LAB — ALLERGY PANEL 19, SEAFOOD GROUP
Allergen Salmon IgE: <0.1 kU/L
F023-IgE Crab: <0.1 kU/L
F080-IgE Lobster: <0.1 kU/L
Tuna: <0.1 kU/L

## 2018-02-13 NOTE — Telephone Encounter (Signed)
I left a detailed message advising patient that the labs are not all back just yet. The ones that are back have not been reviewed by the provider and unfortunately we are not allowed to release the results without the provider reviewing advising. I did let her know to call back with any further questions, otherwise we would reach out as soon as we have the results from provider.

## 2018-02-13 NOTE — Telephone Encounter (Signed)
Patient had blood work and tried to read it in her My Chart, but does not understand what the results are.

## 2018-02-15 ENCOUNTER — Other Ambulatory Visit: Payer: Self-pay | Admitting: Internal Medicine

## 2018-02-17 NOTE — Addendum Note (Signed)
Addended by: Valentina Shaggy on: 02/17/2018 10:59 AM   Modules accepted: Orders

## 2018-02-20 ENCOUNTER — Telehealth: Payer: Self-pay

## 2018-02-20 DIAGNOSIS — H0100B Unspecified blepharitis left eye, upper and lower eyelids: Secondary | ICD-10-CM | POA: Diagnosis not present

## 2018-02-20 DIAGNOSIS — H0100A Unspecified blepharitis right eye, upper and lower eyelids: Secondary | ICD-10-CM | POA: Diagnosis not present

## 2018-02-20 NOTE — Telephone Encounter (Signed)
Lm for pt to call us back  

## 2018-02-20 NOTE — Telephone Encounter (Signed)
She can come by for a prednisone pack to with her nasal inflammation and allergy symptoms. We can also add on azelastine nasal spray two sprays per nostril up to twice daily.   Salvatore Marvel, MD [facility] [place]

## 2018-02-20 NOTE — Telephone Encounter (Signed)
Pt called and said her eyes are swollen, red and matted can we call her in something to help? [phone]

## 2018-03-03 ENCOUNTER — Telehealth: Payer: Self-pay | Admitting: Allergy & Immunology

## 2018-03-03 DIAGNOSIS — T7800XD Anaphylactic reaction due to unspecified food, subsequent encounter: Secondary | ICD-10-CM

## 2018-03-03 NOTE — Telephone Encounter (Signed)
Please advise 

## 2018-03-03 NOTE — Telephone Encounter (Signed)
Pt called and wants to come in for blood test for mussels. [phone].

## 2018-03-06 NOTE — Telephone Encounter (Signed)
I left a detailed message for the patient advising her of hours for lab availability.

## 2018-03-06 NOTE — Telephone Encounter (Signed)
Yes test ordered. She can come in and have that done.   Salvatore Marvel, MD [facility] [place]

## 2018-03-10 DIAGNOSIS — Z79899 Other long term (current) drug therapy: Secondary | ICD-10-CM | POA: Diagnosis not present

## 2018-03-10 DIAGNOSIS — E785 Hyperlipidemia, unspecified: Secondary | ICD-10-CM | POA: Diagnosis not present

## 2018-03-11 DIAGNOSIS — H01111 Allergic dermatitis of right upper eyelid: Secondary | ICD-10-CM | POA: Diagnosis not present

## 2018-03-11 LAB — LIPID PANEL
Chol/HDL Ratio: 2.2 ratio (ref 0.0–4.4)
Cholesterol, Total: 156 mg/dL (ref 100–199)
HDL: 71 mg/dL (ref >39)
LDL Calculated: 73 mg/dL (ref 0–99)
Triglycerides: 61 mg/dL (ref 0–149)
VLDL Cholesterol Cal: 12 mg/dL (ref 5–40)

## 2018-03-11 LAB — COMPREHENSIVE METABOLIC PANEL
A/G RATIO: 1.8 (ref 1.2–2.2)
ALT: 26 IU/L (ref 0–32)
AST: 26 IU/L (ref 0–40)
Albumin: 4.4 g/dL (ref 3.5–4.8)
Alkaline Phosphatase: 47 IU/L (ref 39–117)
BUN/Creatinine Ratio: 23 (ref 12–28)
BUN: 18 mg/dL (ref 8–27)
Bilirubin Total: 0.3 mg/dL (ref 0.0–1.2)
CHLORIDE: 101 mmol/L (ref 96–106)
CO2: 23 mmol/L (ref 20–29)
Calcium: 9.7 mg/dL (ref 8.7–10.3)
Creatinine, Ser: 0.78 mg/dL (ref 0.57–1.00)
GFR calc Af Amer: 85 mL/min/{1.73_m2} (ref >59)
GFR calc non Af Amer: 74 mL/min/{1.73_m2} (ref >59)
Globulin, Total: 2.5 g/dL (ref 1.5–4.5)
Glucose: 82 mg/dL (ref 65–99)
POTASSIUM: 4.8 mmol/L (ref 3.5–5.2)
Sodium: 140 mmol/L (ref 134–144)
Total Protein: 6.9 g/dL (ref 6.0–8.5)

## 2018-03-13 ENCOUNTER — Encounter: Payer: Self-pay | Admitting: Cardiovascular Disease

## 2018-03-13 ENCOUNTER — Ambulatory Visit (INDEPENDENT_AMBULATORY_CARE_PROVIDER_SITE_OTHER): Payer: Medicare Other | Admitting: Cardiovascular Disease

## 2018-03-13 VITALS — BP 144/70 | HR 87 | Ht 66.0 in | Wt 163.2 lb

## 2018-03-13 DIAGNOSIS — R0602 Shortness of breath: Secondary | ICD-10-CM

## 2018-03-13 DIAGNOSIS — I350 Nonrheumatic aortic (valve) stenosis: Secondary | ICD-10-CM | POA: Diagnosis not present

## 2018-03-13 DIAGNOSIS — E7800 Pure hypercholesterolemia, unspecified: Secondary | ICD-10-CM

## 2018-03-13 DIAGNOSIS — I2510 Atherosclerotic heart disease of native coronary artery without angina pectoris: Secondary | ICD-10-CM | POA: Diagnosis not present

## 2018-03-13 DIAGNOSIS — I10 Essential (primary) hypertension: Secondary | ICD-10-CM | POA: Diagnosis not present

## 2018-03-13 MED ORDER — AMLODIPINE BESYLATE 10 MG PO TABS: 10.0000 mg | ORAL_TABLET | Freq: Every day | ORAL | 1 refills | Status: DC

## 2018-03-13 NOTE — Patient Instructions (Addendum)
Medication Instructions:  INCREASE YOUR AMLODIPINE TO 10 MG DAILY   Labwork: NONE  Testing/Procedures: Your physician has requested that you have an echocardiogram. Echocardiography is a painless test that uses sound waves to create images of your heart. It provides your doctor with information about the size and shape of your heart and how well your heart's chambers and valves are working. This procedure takes approximately one hour. There are no restrictions for this procedure. [address] [place]   Follow-Up: Your physician recommends that you schedule a follow-up appointment in: [place]  Your physician wants you to follow-up in: [place] will receive a reminder letter in the mail two months in advance. If you don't receive a letter, please call our office to schedule the follow-up appointment.  Any Other Special Instructions Will Be Listed Below (If Applicable). MONITOR AND LOG YOUR BLOOD PRESSURE A FEW TIMES A WEEK. BRING READINGS TO YOUR FOLLOW UP   If you need a refill on your cardiac medications before your next appointment, please call your pharmacy.

## 2018-03-13 NOTE — Progress Notes (Signed)
Cardiology Office Note   Date:  03/13/2018   ID:  TANIYAH BALLOW, DOB 1941/07/21, MRN [id number]  PCP:  Colon Branch, MD  Cardiologist:   Fransico Him, MD  No chief complaint on file.     History of Present Illness: Dominique [name] is a 77 y.o. female with coronary calcification, moderate aortic stenosis, moderate TR, mild pulmonary hypertension, hypertension, hyperlipidemia, and severe COPD here for a second opinion on hyperlipidemia.  Dominique [name] saw Dr. Radford Pax on 05/2017.  She was noted to have moderate aortic stenosis (peak velocity 3.1 m/s, mean gradient 21 mmHg).  She had a chest CT 11/2016 that showed coronary calcifications.  She subsequently had a coronary CT-A 05/2017 that revealed less than 50% plaque in the proximal LAD and a mild ascending aortic aneurysm (3.9 cm).  She was noted to have calcification of the aorta and aortic valve.  Echo 05/31/17 that revealed LVEF 60-65% with normal diastolic function.  She had moderate aortic stenosis with a peak velocity of 3.1 m/s and a mean gradient of 21 mmHg.  She also had mild mitral regurgitation.   At her last appointment Dominique [name] was started on rosuvastatin.  She has tolerated this well and her cholesterol has improved significantly.  Overall she has been feeling well.  Her main complaint is allergies and pollen.  She notes some shortness of breath when walking quickly which has been stable.  She several times during the week.  She has no goes for long walks 3 days/week.  She also walks her dog for shorter distances chest pain or pressure.  She occasionally has lower extremity edema that is attributed it to the warm weather.  It improves with elevation of her feet.  She denies orthopnea or PND.  She rarely has episodes of chest pain that she also associates with the  weather and are not exertional.   Past Medical History:  Diagnosis Date  . Aortic stenosis, moderate    echo 04/2016  . Coronary artery calcification seen on CAT scan      no ischemia no nuclear stress test 2017  . Emphysema lung ([place])   . History of blood transfusion 1971   with C-section complication  . Hyperlipidemia    LDL goal < 70  . Hypertension   . Pulmonary HTN ([place]) 06/05/2016   Moderate with PASP 1mmHg by echo 04/2016 likely Group 3 from COPD and possibly Group 2 from pulmonary venous HTN associated with moderate AS  . Sacral fracture ([place])   . UTI (lower urinary tract infection)     Past Surgical History:  Procedure Laterality Date  . APPENDECTOMY  1951  . CATARACT EXTRACTION    . CESAREAN SECTION     1971  . IR RADIOLOGY PERIPHERAL GUIDED IV START  05/30/2017  . IR US GUIDE VASC ACCESS RIGHT  05/30/2017  . JOINT REPLACEMENT  2016   sacroplasty  . LAMINECTOMY    . TOTAL HIP ARTHROPLASTY Bilateral   . TUBAL LIGATION  1975     Current Outpatient Medications  Medication Sig Dispense Refill  . amLODipine (NORVASC) 10 MG tablet Take 1 tablet (10 mg total) by mouth daily. 90 tablet 1  . aspirin EC 81 MG tablet Take 81 mg by mouth daily.    . benazepril (LOTENSIN) 20 MG tablet Take 1 tablet (20 mg total) by mouth daily. 90 tablet 1  . Calcium Carb-Cholecalciferol (CALCIUM 600 + D PO) Take 600 mg by mouth daily.    Marland Kitchen  cetirizine (ZYRTEC) 10 MG chewable tablet Chew 10 mg by mouth daily.    . Cholecalciferol (VITAMIN D3) 5000 UNITS CAPS Take 5,000 Units by mouth daily.     Marland Kitchen denosumab (PROLIA) 60 MG/ML SOLN injection Inject 60 mg into the skin every 6 (six) months. Administer in upper arm, thigh, or abdomen    . EPINEPHrine 0.3 mg/0.3 mL IJ SOAJ injection USE 1 PEN AS DIRECTED  12  . Multiple Vitamin (MULTIVITAMIN WITH MINERALS) TABS tablet Take 1 tablet by mouth daily.    . rosuvastatin (CRESTOR) 40 MG tablet Take 1 tablet (40 mg total) by mouth daily. 90 tablet 3   Current Facility-Administered Medications  Medication Dose Route Frequency Provider Last Rate Last Dose  . 0.9 %  sodium chloride infusion  500 mL Intravenous Continuous  Nandigam, Venia Minks, MD        Allergies:   Patient has no known allergies.    Social History:  The patient  reports that she quit smoking about 13 years ago. Her smoking use included cigarettes. She has a 40.00 pack-year smoking history. She has never used smokeless tobacco. She reports that she drinks about 1.8 - 2.4 oz of alcohol per week. She reports that she does not use drugs.   Family History:  The patient's family history includes Breast cancer in her mother; Cancer in her brother; Diabetes in her son.    ROS:  Please see the history of present illness.   Otherwise, review of systems are positive for none.   All other systems are reviewed and negative.    PHYSICAL EXAM: VS:  BP (!) 144/70   Pulse 87   Ht 5\' 6"  (1.676 m)   Wt 163 lb 3.2 oz (74 kg)   SpO2 97%   BMI 26.34 kg/m  , BMI Body mass index is 26.34 kg/m. GENERAL:  Well appearing HEENT: Pupils equal round and reactive, fundi not visualized, oral mucosa unremarkable NECK:  No jugular venous distention, waveform within normal limits, carotid upstroke brisk and symmetric, no bruits, no thyromegaly LYMPHATICS:  No cervical adenopathy LUNGS:  Clear to auscultation bilaterally HEART:  RRR.  PMI not displaced or sustained,S1 and S2 within normal limits, no S3, no S4, no clicks, no rubs, III/VI mid-peaking systolic murmur at the left upper sternal border ABD:  Flat, positive bowel sounds normal in frequency in pitch, no bruits, no rebound, no guarding, no midline pulsatile mass, no hepatomegaly, no splenomegaly EXT:  2 plus pulses throughout, no edema, no cyanosis no clubbing SKIN:  No rashes no nodules NEURO:  Cranial nerves II through XII grossly intact, motor grossly intact throughout PSYCH:  Cognitively intact, oriented to person place and time   EKG:  EKG is not ordered today.  Coronary CT-A 05/30/17: IMPRESSION: 1) Calcium score 64 which is 28 th percentile for age and sex  2) Left dominant coronary arteries with  less than 50% calcified stenosis in proximal LAD  3) Aortic root dilatation 3.9 cm  4) Aortic and Aortic valve calcification  Echo 05/31/17: Study Conclusions  - Left ventricle: The cavity size was normal. Systolic function was   normal. The estimated ejection fraction was in the range of 60%   to 65%. Wall motion was normal; there were no regional wall   motion abnormalities. Left ventricular diastolic function   parameters were normal. - Aortic valve: Severely calcified annulus. Trileaflet; mildly   thickened, moderately calcified leaflets. There was moderate   stenosis. Peak velocity (S): 309 cm/s.  Mean gradient (S): 21 mm   Hg. Valve area (VTI): 0.86 cm^2. Valve area (Vmax): 0.89 cm^2.   Valve area (Vmean): 0.93 cm^2. - Mitral valve: There was mild regurgitation. - Pulmonary arteries: PA peak pressure: 34 mm Hg (S).  Recent Labs: 05/10/2017: Hemoglobin 14.0; Platelets 335.0 03/10/2018: ALT 26; BUN 18; Creatinine, Ser 0.78; Potassium 4.8; Sodium 140    Lipid Panel    Component Value Date/Time   CHOL 156 03/10/2018 1108   TRIG 61 03/10/2018 1108   HDL 71 03/10/2018 1108   CHOLHDL 2.2 03/10/2018 1108   CHOLHDL 2 11/13/2017 1127   VLDL 13.2 11/13/2017 1127   LDLCALC 73 03/10/2018 1108      Wt Readings from Last 3 Encounters:  03/13/18 163 lb 3.2 oz (74 kg)  01/29/18 161 lb 12.8 oz (73.4 kg)  12/13/17 159 lb (72.1 kg)      ASSESSMENT AND PLAN:  # Hyperlipidemia: Lipids improved on rosuvastatin.  No changes today.  # Moderate aortic stenosis:  Mean gradient 21 mmHg.  Repeat echo 05/2018.  She remains asymptomatic.  # Mild ascending aorta aneurysm: 3.9 cm.  Repeat echo 05/2018.  # Hypertension: BP is above goal both initially and on repeat.  Will increase amlodipine to 10 mg daily.  Continue benazepril.  She will check her blood pressure few times a week and follow-up with our pharmacist in 1 month.  # Asymptomatic coronary calcification:  Continue aspirin and   rosuvastatin.  # Mild pulmonary hypertension: Likely 2/2 COPD.  She is euvolemic.  Increase amlodipine.  Current medicines are reviewed at length with the patient today.  The patient does not have concerns regarding medicines.  The following changes have been made:   Labs/ tests ordered today include:   Orders Placed This Encounter  Procedures  . ECHOCARDIOGRAM COMPLETE     Disposition:   FU with Dominique Toure C. [name], MD, [facility] in 1 year.  Pharmacist in 1 month.  Signed, [name] Carvell C. [name], MD, [facility]  03/13/2018 11:22 AM    [place]

## 2018-05-06 DIAGNOSIS — D1800 Hemangioma unspecified site: Secondary | ICD-10-CM | POA: Diagnosis not present

## 2018-05-06 DIAGNOSIS — D225 Melanocytic nevi of trunk: Secondary | ICD-10-CM | POA: Diagnosis not present

## 2018-05-06 DIAGNOSIS — L821 Other seborrheic keratosis: Secondary | ICD-10-CM | POA: Diagnosis not present

## 2018-05-06 DIAGNOSIS — L814 Other melanin hyperpigmentation: Secondary | ICD-10-CM | POA: Diagnosis not present

## 2018-05-14 ENCOUNTER — Encounter: Payer: Medicare Other | Admitting: Internal Medicine

## 2018-05-14 ENCOUNTER — Encounter: Payer: Self-pay | Admitting: Internal Medicine

## 2018-05-14 ENCOUNTER — Ambulatory Visit (INDEPENDENT_AMBULATORY_CARE_PROVIDER_SITE_OTHER): Payer: Medicare Other | Admitting: Internal Medicine

## 2018-05-14 VITALS — BP 122/66 | HR 81 | Temp 97.6°F | Resp 16 | Ht 66.0 in | Wt 162.2 lb

## 2018-05-14 DIAGNOSIS — I10 Essential (primary) hypertension: Secondary | ICD-10-CM

## 2018-05-14 DIAGNOSIS — E559 Vitamin D deficiency, unspecified: Secondary | ICD-10-CM

## 2018-05-14 DIAGNOSIS — I350 Nonrheumatic aortic (valve) stenosis: Secondary | ICD-10-CM

## 2018-05-14 DIAGNOSIS — M810 Age-related osteoporosis without current pathological fracture: Secondary | ICD-10-CM | POA: Diagnosis not present

## 2018-05-14 DIAGNOSIS — E7800 Pure hypercholesterolemia, unspecified: Secondary | ICD-10-CM

## 2018-05-14 DIAGNOSIS — I2510 Atherosclerotic heart disease of native coronary artery without angina pectoris: Secondary | ICD-10-CM | POA: Diagnosis not present

## 2018-05-14 DIAGNOSIS — I272 Pulmonary hypertension, unspecified: Secondary | ICD-10-CM | POA: Diagnosis not present

## 2018-05-14 DIAGNOSIS — Z01419 Encounter for gynecological examination (general) (routine) without abnormal findings: Secondary | ICD-10-CM | POA: Diagnosis not present

## 2018-05-14 DIAGNOSIS — Z6826 Body mass index (BMI) 26.0-26.9, adult: Secondary | ICD-10-CM | POA: Diagnosis not present

## 2018-05-14 DIAGNOSIS — Z1231 Encounter for screening mammogram for malignant neoplasm of breast: Secondary | ICD-10-CM | POA: Diagnosis not present

## 2018-05-14 LAB — CBC WITH DIFFERENTIAL/PLATELET
BASOS ABS: 0.1 10*3/uL (ref 0.0–0.1)
Basophils Relative: 1 % (ref 0.0–3.0)
EOS ABS: 0.5 10*3/uL (ref 0.0–0.7)
Eosinophils Relative: 6.8 % — ABNORMAL HIGH (ref 0.0–5.0)
HCT: 41.5 % (ref 36.0–46.0)
HEMOGLOBIN: 14 g/dL (ref 12.0–15.0)
LYMPHS PCT: 24.5 % (ref 12.0–46.0)
Lymphs Abs: 2 10*3/uL (ref 0.7–4.0)
MCHC: 33.7 g/dL (ref 30.0–36.0)
MCV: 91.8 fl (ref 78.0–100.0)
MONO ABS: 0.7 10*3/uL (ref 0.1–1.0)
Monocytes Relative: 9 % (ref 3.0–12.0)
NEUTROS ABS: 4.8 10*3/uL (ref 1.4–7.7)
Neutrophils Relative %: 58.7 % (ref 43.0–77.0)
PLATELETS: 254 10*3/uL (ref 150.0–400.0)
RBC: 4.52 Mil/uL (ref 3.87–5.11)
RDW: 13.7 % (ref 11.5–15.5)
WBC: 8.1 10*3/uL (ref 4.0–10.5)

## 2018-05-14 LAB — TSH: TSH: 1.12 u[IU]/mL (ref 0.35–4.50)

## 2018-05-14 LAB — HM MAMMOGRAPHY

## 2018-05-14 MED ORDER — AMLODIPINE BESYLATE 10 MG PO TABS: 10.0000 mg | ORAL_TABLET | Freq: Every day | ORAL | 3 refills | Status: DC

## 2018-05-14 NOTE — Progress Notes (Signed)
Subjective:    Patient ID: Dominique [name], female    DOB: 11-15-1940, 77 y.o.   MRN: [id number]  DOS:  05/14/2018 Type of visit - description : rov Interval history: HTN: Good compliance with medication, ambulatory BPs when checked are "normal". High cholesterol: Good compliance to medication, last LDL satisfactory. History of emphysema, asymptomatic.   Review of Systems Denies chest pain or difficulty breathing. No cough or wheezing Occasional lower extremity edema (peri-ankle), worse on the right  , symptoms are worse during the summer.   Past Medical History:  Diagnosis Date  . Aortic stenosis, moderate    echo 04/2016  . Coronary artery calcification seen on CAT scan    no ischemia no nuclear stress test 2017  . Emphysema lung ([place])   . History of blood transfusion 1971   with C-section complication  . Hyperlipidemia    LDL goal < 70  . Hypertension   . Pulmonary HTN ([place]) 06/05/2016   Moderate with PASP 46mmHg by echo 04/2016 likely Group 3 from COPD and possibly Group 2 from pulmonary venous HTN associated with moderate AS  . Sacral fracture ([place])   . UTI (lower urinary tract infection)     Past Surgical History:  Procedure Laterality Date  . APPENDECTOMY  1951  . CATARACT EXTRACTION    . CESAREAN SECTION     1971  . IR RADIOLOGY PERIPHERAL GUIDED IV START  05/30/2017  . IR US GUIDE VASC ACCESS RIGHT  05/30/2017  . JOINT REPLACEMENT  2016   sacroplasty  . LAMINECTOMY    . TOTAL HIP ARTHROPLASTY Bilateral   . TUBAL LIGATION  1975    Social History   Socioeconomic History  . Marital status: Widowed    Spouse name: Not on file  . Number of children: 2  . Years of education: Not on file  . Highest education level: Not on file  Occupational History  . Occupation: n/a  Social Needs  . Financial resource strain: Not on file  . Food insecurity:    Worry: Not on file    Inability: Not on file  . Transportation needs:    Medical: Not on file    Non-medical:  Not on file  Tobacco Use  . Smoking status: Former Smoker    Packs/day: 1.00    Years: 40.00    Pack years: 40.00    Types: Cigarettes    Last attempt to quit: 03/19/2004    Years since quitting: 14.1  . Smokeless tobacco: Never Used  Substance and Sexual Activity  . Alcohol use: Yes    Alcohol/week: 1.8 - 2.4 oz    Types: 3 - 4 Glasses of wine per week    Comment: socially  . Drug use: No  . Sexual activity: Not on file  Lifestyle  . Physical activity:    Days per week: Not on file    Minutes per session: Not on file  . Stress: Not on file  Relationships  . Social connections:    Talks on phone: Not on file    Gets together: Not on file    Attends religious service: Not on file    Active member of club or organization: Not on file    Attends meetings of clubs or organizations: Not on file    Relationship status: Not on file  . Intimate partner violence:    Fear of current or ex partner: Not on file    Emotionally abused: Not on file  Physically abused: Not on file    Forced sexual activity: Not on file  Other Topics Concern  . Not on file  Social History Narrative   From [place]   1 living son      Allergies as of 05/14/2018   No Known Allergies     Medication List        Accurate as of 05/14/18  5:53 PM. Always use your most recent med list.          amLODipine 10 MG tablet Commonly known as:  NORVASC Take 1 tablet (10 mg total) by mouth daily.   aspirin EC 81 MG tablet Take 81 mg by mouth daily.   benazepril 20 MG tablet Commonly known as:  LOTENSIN Take 1 tablet (20 mg total) by mouth daily.   CALCIUM 600 + D PO Take 600 mg by mouth daily.   cetirizine 10 MG chewable tablet Commonly known as:  ZYRTEC Chew 10 mg by mouth daily.   denosumab 60 MG/ML Soln injection Commonly known as:  PROLIA Inject 60 mg into the skin every 6 (six) months. Administer in upper arm, thigh, or abdomen   EPINEPHrine 0.3 mg/0.3 mL Soaj injection Commonly known  as:  EPI-PEN USE 1 PEN AS DIRECTED   multivitamin with minerals Tabs tablet Take 1 tablet by mouth daily.   rosuvastatin 40 MG tablet Commonly known as:  CRESTOR Take 1 tablet (40 mg total) by mouth daily.   Vitamin D3 5000 units Caps Take 5,000 Units by mouth daily.          Objective:   Physical Exam BP 122/66 (BP Location: Left Arm, Patient Position: Sitting, Cuff Size: Small)   Pulse 81   Temp 97.6 F (36.4 C) (Oral)   Resp 16   Ht 5\' 6"  (1.676 m)   Wt 162 lb 4 oz (73.6 kg)   SpO2 95%   BMI 26.19 kg/m  General:   Well developed, NAD, see BMI.  HEENT:  Normocephalic . Face symmetric, atraumatic Neck: No thyromegaly Lungs:  CTA B Normal respiratory effort, no intercostal retractions, no accessory muscle use. Heart: RRR, systolic murmur.  no pretibial edema bilaterally  Abdomen:  Not distended, soft, non-tender. No rebound or rigidity.   Skin: Not pale. Not jaundice Neurologic:  alert & oriented X3.  Speech normal, gait appropriate for age and unassisted Psych--  Cognition and judgment appear intact.  Cooperative with normal attention span and concentration.  Behavior appropriate. No anxious or depressed appearing.     Assessment & Plan:    Assessment HTN Hyperlipidemia PULM: --Former smoker, quit 2006 --H/o Emphysema, remote -- CT chest angio  05-2016---> pulm nodule x2,reticular infiltrate ; f/u CT 11/2016 stable nodule, no f/u -- CT chest angio  05-2016--->  enlarged R P.A. and Ao ; hac CT coronary 05/2017, f/u per cards CV: ---H/o murmur (since childhood) --Aortic stenosis: ECHO 7-17, AoS, next echo 05/2018 --DOE: (-) myoview  8-017, CT angio chest 05-2016:  no PE (see above) MSK --DJD --Sacral Fx, sacral insufficiency, s/p sacroplasty (radiology) 07-2015 --H/o osteoporosis :  took fosamax while in [place], Fosamax rx again by ortho after sacral Fx 07-2015, never took it, s/p #1 prolia 11-14-2015 --T score 08-2015 >> normal (likely normal d/t  DJD?) H/o vit D def: Normal level 09-2015   PLAN  HTN: On amlodipine, Lotensin, last BMP satisfactory.  Check a CBC Hyperlipidemia: On Crestor, last LDL 73.  No change Aortic stenosis: Note from cardiology reviewed, stable. Ascending aortic  aneurysm: next Echo 05-2018 per cards note Osteoporosis: last DEXA wnl 2016 (d/t DJD?) consider check a DEXA at some point, continue Prolia (last 11-2017).  Check a TSH Vitamin D deficiency: Checking levels.  On oral supplements Preventive care discussed RTC 6 to 8 months

## 2018-05-14 NOTE — Assessment & Plan Note (Signed)
HTN: On amlodipine, Lotensin, last BMP satisfactory.  Check a CBC Hyperlipidemia: On Crestor, last LDL 73.  No change Aortic stenosis: Note from cardiology reviewed, stable. Ascending aortic aneurysm: next Echo 05-2018 per cards note Osteoporosis: last DEXA wnl 2016 (d/t DJD?) consider check a DEXA at some point, continue Prolia (last 11-2017).  Check a TSH Vitamin D deficiency: Checking levels.  On oral supplements Preventive care discussed RTC 6 to 8 months

## 2018-05-14 NOTE — Assessment & Plan Note (Signed)
-  Td 2017; pnm shot 2012; prevnar 2015; s/p  shingrex x2 per pt  -Female care: sees gyn,  PAPs- MMG done at gyn -CCS:  Had 2-3 Cscopes before, +polyps,  cscope [place] 06-2016, referred to [facility] scope done 11-09-16, bx sessile serrated polyp, next per GI

## 2018-05-14 NOTE — Patient Instructions (Addendum)
GO TO THE LAB : Get the blood work     GO TO THE FRONT DESK Schedule your next appointment for a checkup in 6 to 8 months     Check the  blood pressure 2 or 3 times a month  Be sure your blood pressure is between 110/65 and  135/85. If it is consistently higher or lower, let me know    

## 2018-05-14 NOTE — Progress Notes (Signed)
Pre visit review using our clinic review tool, if applicable. No additional management support is needed unless otherwise documented below in the visit note. 

## 2018-05-16 LAB — VITAMIN D 1,25 DIHYDROXY
VITAMIN D3 1, 25 (OH): 28 pg/mL
Vitamin D 1, 25 (OH)2 Total: 28 pg/mL (ref 18–72)

## 2018-05-23 ENCOUNTER — Encounter: Payer: Self-pay | Admitting: Internal Medicine

## 2018-05-28 ENCOUNTER — Telehealth: Payer: Self-pay | Admitting: Internal Medicine

## 2018-05-28 NOTE — Telephone Encounter (Signed)
Prolia benefits received [place] not required $185 deductible met Secondary covers 20%   Patient may owe approximately $56 OOP  Patient due after 06/15/18  Letter mailed to inform patient of benefits and to schedule

## 2018-06-02 NOTE — Telephone Encounter (Signed)
Prolia scheduled for 06/17/2018. [place] can you order please?

## 2018-06-03 NOTE — Telephone Encounter (Signed)
Medication is in fridge for pt. 

## 2018-06-13 ENCOUNTER — Ambulatory Visit (HOSPITAL_COMMUNITY): Payer: Medicare Other | Attending: Cardiology

## 2018-06-13 ENCOUNTER — Other Ambulatory Visit: Payer: Self-pay

## 2018-06-13 DIAGNOSIS — I10 Essential (primary) hypertension: Secondary | ICD-10-CM | POA: Insufficient documentation

## 2018-06-13 DIAGNOSIS — I350 Nonrheumatic aortic (valve) stenosis: Secondary | ICD-10-CM | POA: Insufficient documentation

## 2018-06-13 DIAGNOSIS — E785 Hyperlipidemia, unspecified: Secondary | ICD-10-CM | POA: Insufficient documentation

## 2018-06-17 ENCOUNTER — Telehealth: Payer: Self-pay

## 2018-06-17 ENCOUNTER — Ambulatory Visit (INDEPENDENT_AMBULATORY_CARE_PROVIDER_SITE_OTHER): Payer: Medicare Other

## 2018-06-17 DIAGNOSIS — M810 Age-related osteoporosis without current pathological fracture: Secondary | ICD-10-CM

## 2018-06-17 DIAGNOSIS — I350 Nonrheumatic aortic (valve) stenosis: Secondary | ICD-10-CM

## 2018-06-17 MED ORDER — DENOSUMAB 60 MG/ML ~~LOC~~ SOSY
60.0000 mg | PREFILLED_SYRINGE | Freq: Once | SUBCUTANEOUS | Status: AC
Start: 2018-06-17 — End: 2018-06-17
  Administered 2018-06-17: 60 mg via SUBCUTANEOUS

## 2018-06-17 NOTE — Telephone Encounter (Signed)
Notes recorded by [name], MD on 06/17/2018 at 5:24 AM EDT Echo shows that her heart squeezes well. She has moderate aortic stenosis that has progressed since last year. Let us know if she develops shortness of breath, chest pain, edema, or near syncope. Otherwise, repeat echo in 6 months.

## 2018-06-17 NOTE — Progress Notes (Signed)
Pre visit review using our clinic tool,if applicable. No additional management support is needed unless otherwise documented below in the visit note.   Patient in for Prolia injection per order from Dr. Kathlene November.  No complaints voiced this visit. Given 60 mg SQ left arm. Patient tolerated well.  6 mont reminder card given to patient for next injection.

## 2018-06-23 DIAGNOSIS — H524 Presbyopia: Secondary | ICD-10-CM | POA: Diagnosis not present

## 2018-06-23 DIAGNOSIS — H33321 Round hole, right eye: Secondary | ICD-10-CM | POA: Diagnosis not present

## 2018-06-23 DIAGNOSIS — H04123 Dry eye syndrome of bilateral lacrimal glands: Secondary | ICD-10-CM | POA: Diagnosis not present

## 2018-07-28 ENCOUNTER — Ambulatory Visit (INDEPENDENT_AMBULATORY_CARE_PROVIDER_SITE_OTHER): Payer: Medicare Other | Admitting: Adult Health

## 2018-07-28 ENCOUNTER — Telehealth: Payer: Self-pay | Admitting: Cardiovascular Disease

## 2018-07-28 ENCOUNTER — Encounter: Payer: Self-pay | Admitting: Adult Health

## 2018-07-28 VITALS — BP 110/70 | HR 91 | Ht 66.0 in | Wt 162.0 lb

## 2018-07-28 DIAGNOSIS — E7800 Pure hypercholesterolemia, unspecified: Secondary | ICD-10-CM | POA: Diagnosis not present

## 2018-07-28 DIAGNOSIS — I10 Essential (primary) hypertension: Secondary | ICD-10-CM | POA: Diagnosis not present

## 2018-07-28 DIAGNOSIS — I2510 Atherosclerotic heart disease of native coronary artery without angina pectoris: Secondary | ICD-10-CM

## 2018-07-28 DIAGNOSIS — I358 Other nonrheumatic aortic valve disorders: Secondary | ICD-10-CM | POA: Diagnosis not present

## 2018-07-28 DIAGNOSIS — Z79899 Other long term (current) drug therapy: Secondary | ICD-10-CM | POA: Diagnosis not present

## 2018-07-28 DIAGNOSIS — R079 Chest pain, unspecified: Secondary | ICD-10-CM

## 2018-07-28 MED ORDER — NITROGLYCERIN 0.4 MG SL SUBL: 0.4000 mg | SUBLINGUAL_TABLET | SUBLINGUAL | 3 refills | Status: DC | PRN

## 2018-07-28 NOTE — Telephone Encounter (Signed)
Spoke with pt. Pt sts that she returned from a trip to [place] on Fri 9/27. On Sat 9/28 she experienced an episode of chest pain that she describes as a heaviness on a scale of 0-10 she rate as a a 6 or 7. Pain did not radiate. The episode lasted 3-4 hours and was associated with diaphoresis. She Did take Aspirin and the pain mildly improved. She has not has any reoccurrence of chest pain, she denies sob, feeling faint or syncope. She is also having LE edema. The Left greater that the right, she denies pain or warmth in the area. She has been elevating her legs as much as possible and it has improved a little. appt scheduled for today @ 2pm with Jory Sims, D-NP. Pt aware and voiced appreciation for the assistance.

## 2018-07-28 NOTE — Patient Instructions (Signed)
Medication Instructions:  PLACE 1 TABLET UNDER THE TONGUE EVERY 5 MINUTES X3 AS NEEDED FOR CHEST PAIN SHOULD CHEST PAIN CONTINUE PROCEED DIRECTLY TO THE ER FOR EVALUATION  If you need a refill on your cardiac medications before your next appointment, please call your pharmacy.  Labwork: BMET TODAY HERE IN OUR OFFICE AT [facility]  Take the provided lab slips with you to the lab for your blood draw.     Special Instructions: CT W/FFR AT [place]  Follow-Up: Your physician wants you to follow-up in: AFTER CT WITH DR [facility].  Thank you for choosing [facility] at [facility]!!       Please arrive at the [facility] main entrance of [facility] at   AM (30-45 minutes prior to test start time)  [facility] [address], [place] [address] [phone]  Proceed to the [facility] Radiology Department (First Floor).  Please follow these instructions carefully (unless otherwise directed):  Hold all erectile dysfunction medications at least 48 hours prior to test.  On the Night Before the Test: . Be sure to Drink plenty of water. . Do not consume any caffeinated/decaffeinated beverages or chocolate 12 hours prior to your test. . Do not take any antihistamines 12 hours prior to your test. . If you take Metformin do not take 24 hours prior to test.  On the Day of the Test: . Drink plenty of water. Do not drink any water within one hour of the test. . Do not eat any food 4 hours prior to the test. . You may take your regular medications prior to the test. . IF NOT ON BETA BLOCKER-Take 50 mg of Lopressor (metoprolol) one hour before test . HOLD Furosemide morning of the test.  After the Test: . Drink plenty of water. . After receiving IV contrast, you may experience a mild flushed feeling. This is normal. . On occasion, you may experience a mild rash up to 24 hours after the test. This is not dangerous. If this occurs, you can take Benadryl 25  mg and increase your fluid intake. . If you experience trouble breathing, this can be serious. If it is severe call 911 IMMEDIATELY. If it is mild, please call our office. . If you take any of these medications: Glipizide/Metformin, Avandament, Glucavance, please do not take 48 hours after completing test.

## 2018-07-28 NOTE — Telephone Encounter (Signed)
New message  Pt c/o of Chest Pain: STAT if CP now or developed within 24 hours  1. Are you having CP right now? No   2. Are you experiencing any other symptoms (ex. SOB, nausea, vomiting, sweating)? sweating  3. How long have you been experiencing CP? 3 hours on 07/26/2018  4. Is your CP continuous or coming and going? Coming and going  5. Have you taken Nitroglycerin? No  ?

## 2018-07-28 NOTE — Progress Notes (Signed)
Cardiology Office Note   Date:  07/28/2018   ID:  Evann, Koelzer 08-06-1941, MRN [id number]  PCP:  Colon Branch, MD  Cardiologist:  Dr. [name]  Chief Complaint  Patient presents with  . Shortness of Breath  . Chest Pain  . Edema    legs  . Pain    neck pain of siffness     History of Present Illness: Dominique [name] is a 77 y.o. female who presents for ongoing assessment and management of CAD, moderate aortic stenosis, moderate TR, mild pulmonary hypertension. She also has a history of COPD.   Coronary CTA 05/2017 revealed less than 50% plaque in the proximal LAD, and a mild ascending aortic dilation (3.9). She was placed on rosuvastatin.She reported rare episodes of chest pain which she associates with the weather.   She has recently returned from a trip to [place] and called our office today with complaints of LEE, chest pressure and dyspnea.   She was at an [employer] Fest celebration when she first returned and felt squeezing chest pain 'around my heart' with flushing feeling, and mild dyspnea. She states she has chronic pain, but this was more severe.   She was not exerting herself, she was sitting talking to friends. She states that it lasted 3-5 minutes and went away on its own. Unfortunately the pain returned " a few more times'" that evening. She took an ASA and went to bed when she returned home and has not had any recurrence since that time. She is now having recurrent neck pain. She has had a massage which did not relieve her neck pain.This pain comes as goes as well.   Past Medical History:  Diagnosis Date  . Aortic stenosis, moderate    echo 04/2016  . Coronary artery calcification seen on CAT scan    no ischemia no nuclear stress test 2017  . Emphysema lung ([place])   . History of blood transfusion 1971   with C-section complication  . Hyperlipidemia    LDL goal < 70  . Hypertension   . Pulmonary HTN ([place]) 06/05/2016   Moderate with PASP 32mmHg by echo 04/2016 likely  Group 3 from COPD and possibly Group 2 from pulmonary venous HTN associated with moderate AS  . Sacral fracture ([place])   . UTI (lower urinary tract infection)     Past Surgical History:  Procedure Laterality Date  . APPENDECTOMY  1951  . CATARACT EXTRACTION    . CESAREAN SECTION     1971  . IR RADIOLOGY PERIPHERAL GUIDED IV START  05/30/2017  . IR US GUIDE VASC ACCESS RIGHT  05/30/2017  . JOINT REPLACEMENT  2016   sacroplasty  . LAMINECTOMY    . TOTAL HIP ARTHROPLASTY Bilateral   . TUBAL LIGATION  1975     Current Outpatient Medications  Medication Sig Dispense Refill  . amLODipine (NORVASC) 10 MG tablet Take 1 tablet (10 mg total) by mouth daily. 90 tablet 3  . aspirin EC 81 MG tablet Take 81 mg by mouth daily.    . benazepril (LOTENSIN) 20 MG tablet Take 1 tablet (20 mg total) by mouth daily. 90 tablet 1  . Calcium Carb-Cholecalciferol (CALCIUM 600 + D PO) Take 600 mg by mouth daily.    . cetirizine (ZYRTEC) 10 MG chewable tablet Chew 10 mg by mouth daily.    . Cholecalciferol (VITAMIN D3) 5000 UNITS CAPS Take 5,000 Units by mouth daily.     Marland Kitchen denosumab (PROLIA) 60  MG/ML SOLN injection Inject 60 mg into the skin every 6 (six) months. Administer in upper arm, thigh, or abdomen    . EPINEPHrine 0.3 mg/0.3 mL IJ SOAJ injection USE 1 PEN AS DIRECTED  12  . Multiple Vitamin (MULTIVITAMIN WITH MINERALS) TABS tablet Take 1 tablet by mouth daily.    . rosuvastatin (CRESTOR) 40 MG tablet Take 1 tablet (40 mg total) by mouth daily. 90 tablet 3   No current facility-administered medications for this visit.     Allergies:   Patient has no known allergies.    Social History:  The patient  reports that she quit smoking about 14 years ago. Her smoking use included cigarettes. She has a 40.00 pack-year smoking history. She has never used smokeless tobacco. She reports that she drinks about 3.0 - 4.0 standard drinks of alcohol per week. She reports that she does not use drugs.   Family History:   The patient's family history includes Breast cancer in her mother; Cancer in her brother; Diabetes in her son.    ROS: All other systems are reviewed and negative. Unless otherwise mentioned in H&P    PHYSICAL EXAM: VS:  BP 110/70   Pulse 91   Ht 5\' 6"  (1.676 m)   Wt 162 lb (73.5 kg)   BMI 26.15 kg/m  , BMI Body mass index is 26.15 kg/m. GEN: Well nourished, well developed, in no acute distress HEENT: normal Neck: no JVD, carotid bruits, or masses Cardiac: RRR; no murmurs, rubs, or gallops,no edema  Respiratory:  Clear to auscultation bilaterally, normal work of breathing GI: soft, nontender, nondistended, + BS MS: no deformity or atrophy Skin: warm and dry, no rash Neuro:  Strength and sensation are intact Psych: euthymic mood, full affect   EKG:  NSR rate of 91 bpm. Occasional PVC's.   Recent Labs: 03/10/2018: ALT 26; BUN 18; Creatinine, Ser 0.78; Potassium 4.8; Sodium 140 05/14/2018: Hemoglobin 14.0; Platelets 254.0; TSH 1.12    Lipid Panel    Component Value Date/Time   CHOL 156 03/10/2018 1108   TRIG 61 03/10/2018 1108   HDL 71 03/10/2018 1108   CHOLHDL 2.2 03/10/2018 1108   CHOLHDL 2 11/13/2017 1127   VLDL 13.2 11/13/2017 1127   LDLCALC 73 03/10/2018 1108      Wt Readings from Last 3 Encounters:  07/28/18 162 lb (73.5 kg)  05/14/18 162 lb 4 oz (73.6 kg)  03/13/18 163 lb 3.2 oz (74 kg)      Other studies Reviewed: Echocardiogram Jun 17, 2018 Left ventricle: The cavity size was normal. Wall thickness was   normal. Systolic function was vigorous. The estimated ejection   fraction was in the range of 65% to 70%. Wall motion was normal;   there were no regional wall motion abnormalities. Doppler   parameters are consistent with abnormal left ventricular   relaxation (grade 1 diastolic dysfunction). - Aortic valve: Trileaflet; moderately thickened, moderately   calcified leaflets. Valve mobility was restricted. There was   moderate stenosis. Peak velocity  (S): 377 cm/s. Mean gradient   (S): 36 mm Hg. Peak gradient (S): 57 mm Hg. - Pulmonary arteries: Systolic pressure was mildly increased. PA   peak pressure: 37 mm Hg (S).  Stress Test 06/18/2016 Study Highlights     Nuclear stress EF: 81%.  No T wave inversion was noted during stress.  There was no ST segment deviation noted during stress.  This is a low risk study.   Normal stress perfusion. Globally reduced tracer uptake at  rest. No ischemia. LVEF 81%, normal wall motion. This is a low risk study.    ASSESSMENT AND PLAN:  1.  CAD: CTA in 2018 demonstrated 50% LAD. Due to recurrent symptoms, I have offered a stress test vs a repeat CTA with FFR. She requests CTA. This will be scheduled, In the interim, she will have BMET drawn. I have given her a Rx for NTG SL in case pain returns.   2. Hx COPD: She has stopped smoking 12 years ago.   3. Hypertension:  BP is well controlled. No changes in regimen.She will continue ACE inhibitor and amlodipine.   4. Hypercholesterolemia:  Continue statin therapy.    Current medicines are reviewed at length with the patient today.    Labs/ tests ordered today include:  CTA with FFR, and BMET.   Phill Myron. West Pugh, ANP, AACC   07/28/2018 2:32 PM    [address] Office [phone] Fax [phone]

## 2018-07-29 LAB — BASIC METABOLIC PANEL
BUN / CREAT RATIO: 30 — AB (ref 12–28)
BUN: 24 mg/dL (ref 8–27)
CHLORIDE: 102 mmol/L (ref 96–106)
CO2: 19 mmol/L — ABNORMAL LOW (ref 20–29)
CREATININE: 0.79 mg/dL (ref 0.57–1.00)
Calcium: 10.2 mg/dL (ref 8.7–10.3)
GFR calc Af Amer: 83 mL/min/{1.73_m2} (ref >59)
GFR calc non Af Amer: 72 mL/min/{1.73_m2} (ref >59)
GLUCOSE: 96 mg/dL (ref 65–99)
Potassium: 4.7 mmol/L (ref 3.5–5.2)
SODIUM: 143 mmol/L (ref 134–144)

## 2018-07-30 NOTE — Addendum Note (Signed)
Addended by: Crissie Reese on: 07/30/2018 08:34 AM   Modules accepted: Orders

## 2018-08-07 DIAGNOSIS — Z23 Encounter for immunization: Secondary | ICD-10-CM | POA: Diagnosis not present

## 2018-08-14 ENCOUNTER — Other Ambulatory Visit: Payer: Self-pay | Admitting: Internal Medicine

## 2018-08-14 ENCOUNTER — Other Ambulatory Visit: Payer: Self-pay | Admitting: Cardiovascular Disease

## 2018-08-21 ENCOUNTER — Ambulatory Visit (HOSPITAL_COMMUNITY): Payer: Medicare Other

## 2018-08-21 ENCOUNTER — Encounter (HOSPITAL_COMMUNITY): Payer: Self-pay

## 2018-08-21 ENCOUNTER — Encounter: Payer: Medicare Other | Admitting: *Deleted

## 2018-08-21 ENCOUNTER — Ambulatory Visit (HOSPITAL_COMMUNITY)
Admission: RE | Admit: 2018-08-21 | Discharge: 2018-08-21 | Disposition: A | Discharge status: Home / Self Care | Payer: Medicare Other | Source: Ambulatory Visit | Attending: Adult Health | Admitting: Adult Health

## 2018-08-21 DIAGNOSIS — I2510 Atherosclerotic heart disease of native coronary artery without angina pectoris: Secondary | ICD-10-CM

## 2018-08-21 DIAGNOSIS — Z006 Encounter for examination for normal comparison and control in clinical research program: Secondary | ICD-10-CM

## 2018-08-21 DIAGNOSIS — R079 Chest pain, unspecified: Secondary | ICD-10-CM

## 2018-08-21 DIAGNOSIS — Z79899 Other long term (current) drug therapy: Secondary | ICD-10-CM | POA: Diagnosis not present

## 2018-08-21 MED ORDER — METOPROLOL TARTRATE 5 MG/5ML IV SOLN
5.0000 mg | INTRAVENOUS | Status: DC | PRN
  Administered 2018-08-21: 5 mg via INTRAVENOUS
  Filled 2018-08-21 (×2): qty 5

## 2018-08-21 MED ORDER — METOPROLOL TARTRATE 5 MG/5ML IV SOLN
INTRAVENOUS | Status: AC
  Filled 2018-08-21: qty 20

## 2018-08-21 MED ORDER — NITROGLYCERIN 0.4 MG SL SUBL
0.8000 mg | SUBLINGUAL_TABLET | Freq: Once | SUBLINGUAL | Status: DC
  Filled 2018-08-21: qty 25

## 2018-08-21 MED ORDER — NITROGLYCERIN 0.4 MG SL SUBL
SUBLINGUAL_TABLET | SUBLINGUAL | Status: AC
  Filled 2018-08-21: qty 2

## 2018-08-21 NOTE — Research (Signed)
Subject Name: Dominique [name]  Subject met inclusion and exclusion criteria.  The informed consent form, study requirements and expectations were reviewed with the subject and questions and concerns were addressed prior to the signing of the consent form.  The subject verbalized understanding of the trial requirements.  The subject agreed to participate in the CADFEM trial and signed the informed consent at 0934 on 08/21/2018.  The informed consent was obtained prior to performance of any protocol-specific procedures for the subject.  A copy of the signed informed consent was given to the subject and a copy was placed in the subject's medical record.   [name]

## 2018-08-26 ENCOUNTER — Other Ambulatory Visit: Payer: Self-pay | Admitting: Adult Health

## 2018-08-26 ENCOUNTER — Other Ambulatory Visit (HOSPITAL_COMMUNITY): Payer: Self-pay | Admitting: Adult Health

## 2018-08-26 DIAGNOSIS — I878 Other specified disorders of veins: Secondary | ICD-10-CM

## 2018-09-02 ENCOUNTER — Ambulatory Visit (HOSPITAL_COMMUNITY)
Admission: RE | Admit: 2018-09-02 | Discharge: 2018-09-02 | Disposition: A | Discharge status: Home / Self Care | Payer: Medicare Other | Source: Ambulatory Visit | Attending: Adult Health | Admitting: Adult Health

## 2018-09-02 ENCOUNTER — Other Ambulatory Visit (HOSPITAL_COMMUNITY): Payer: Self-pay | Admitting: Adult Health

## 2018-09-02 ENCOUNTER — Encounter (HOSPITAL_COMMUNITY): Payer: Self-pay | Admitting: Physician Assistant

## 2018-09-02 ENCOUNTER — Ambulatory Visit (HOSPITAL_COMMUNITY): Admission: RE | Admit: 2018-09-02 | Payer: Medicare Other | Source: Ambulatory Visit

## 2018-09-02 DIAGNOSIS — I2510 Atherosclerotic heart disease of native coronary artery without angina pectoris: Secondary | ICD-10-CM | POA: Diagnosis not present

## 2018-09-02 DIAGNOSIS — Z79899 Other long term (current) drug therapy: Secondary | ICD-10-CM | POA: Diagnosis not present

## 2018-09-02 DIAGNOSIS — R079 Chest pain, unspecified: Secondary | ICD-10-CM | POA: Insufficient documentation

## 2018-09-02 DIAGNOSIS — I700 Atherosclerosis of aorta: Secondary | ICD-10-CM | POA: Insufficient documentation

## 2018-09-02 DIAGNOSIS — I878 Other specified disorders of veins: Secondary | ICD-10-CM

## 2018-09-02 DIAGNOSIS — I872 Venous insufficiency (chronic) (peripheral): Secondary | ICD-10-CM | POA: Diagnosis not present

## 2018-09-02 HISTORY — PX: IR US GUIDE VASC ACCESS LEFT: IMG2389

## 2018-09-02 HISTORY — PX: IR US GUIDE VASC ACCESS RIGHT: IMG2390

## 2018-09-02 HISTORY — PX: IR RADIOLOGY PERIPHERAL GUIDED IV START: IMG5598

## 2018-09-02 MED ORDER — LIDOCAINE HCL 1 % IJ SOLN
INTRAMUSCULAR | Status: DC | PRN
  Administered 2018-09-02: 10 mL

## 2018-09-02 MED ORDER — NITROGLYCERIN 0.4 MG SL SUBL
SUBLINGUAL_TABLET | SUBLINGUAL | Status: AC
  Filled 2018-09-02: qty 2

## 2018-09-02 MED ORDER — METOPROLOL TARTRATE 5 MG/5ML IV SOLN
INTRAVENOUS | Status: AC
  Filled 2018-09-02: qty 15

## 2018-09-02 MED ORDER — LIDOCAINE HCL 1 % IJ SOLN
INTRAMUSCULAR | Status: AC
  Filled 2018-09-02: qty 20

## 2018-09-02 MED ORDER — IOPAMIDOL (ISOVUE-370) INJECTION 76%
100.0000 mL | Freq: Once | INTRAVENOUS | Status: AC | PRN
  Administered 2018-09-02: 100 mL via INTRAVENOUS

## 2018-09-02 MED ORDER — METOPROLOL TARTRATE 5 MG/5ML IV SOLN
5.0000 mg | INTRAVENOUS | Status: DC | PRN
  Administered 2018-09-02 (×4): 5 mg via INTRAVENOUS

## 2018-09-02 MED ORDER — NITROGLYCERIN 0.4 MG SL SUBL
SUBLINGUAL_TABLET | SUBLINGUAL | Status: AC
  Filled 2018-09-02: qty 1

## 2018-09-02 MED ORDER — NITROGLYCERIN 0.4 MG SL SUBL
0.8000 mg | SUBLINGUAL_TABLET | Freq: Once | SUBLINGUAL | Status: AC
  Administered 2018-09-02: 0.8 mg via SUBLINGUAL

## 2018-09-02 NOTE — Progress Notes (Signed)
CT unable to use IV placed by PA. Sluggish and patient indicated pain around site. CT and nurse attempted IV unable to obtain. Spoke with IR Doctor who will placed IV.

## 2018-09-03 DIAGNOSIS — M810 Age-related osteoporosis without current pathological fracture: Secondary | ICD-10-CM

## 2018-09-05 NOTE — Progress Notes (Signed)
Notes recorded by Lendon Colonel, NP on 09/03/2018 at 7:31 AM EST Good report. No obstructive disease. She has some plaque in her left anterior descending artery, but not significant enough to proceed with any cardiac cath or stenting. Important to keep cholesterol under control. Exercise and eat heathy to prevent progression of plaque in the years to come.  Released to [place], pt has not seen   Letter sent

## 2018-09-08 ENCOUNTER — Telehealth: Payer: Self-pay | Admitting: Cardiovascular Disease

## 2018-09-08 NOTE — Telephone Encounter (Signed)
S/W pt she states that she was to f/u after CT. She tried to make an appt and was not able to get appt in a timely manner so she did not make appt. Now appts are scheduling out until January. Also, she has some questions about going on a trip.

## 2018-09-08 NOTE — Telephone Encounter (Signed)
Follow Up:     Pt would like to know if her results from her CT Scan are ready from 09-02-18 please?

## 2018-09-08 NOTE — Telephone Encounter (Signed)
Spoke with patient and scheduled her follow up visit for tomorrow. Patient aware of CT results

## 2018-09-08 NOTE — Telephone Encounter (Signed)
Notes recorded by Lendon Colonel, NP on 09/03/2018 at 7:31 AM EST Good report. No obstructive disease. She has some plaque in her left anterior descending artery, but not significant enough to proceed with any cardiac cath or stenting. Important to keep cholesterol under control. Exercise and eat heathy to prevent progression of plaque in the years to come.

## 2018-09-09 ENCOUNTER — Encounter: Payer: Self-pay | Admitting: Cardiovascular Disease

## 2018-09-09 ENCOUNTER — Ambulatory Visit (INDEPENDENT_AMBULATORY_CARE_PROVIDER_SITE_OTHER): Payer: Medicare Other | Admitting: Cardiovascular Disease

## 2018-09-09 VITALS — BP 118/64 | HR 90 | Ht 66.0 in | Wt 159.8 lb

## 2018-09-09 DIAGNOSIS — I10 Essential (primary) hypertension: Secondary | ICD-10-CM | POA: Diagnosis not present

## 2018-09-09 DIAGNOSIS — I350 Nonrheumatic aortic (valve) stenosis: Secondary | ICD-10-CM | POA: Diagnosis not present

## 2018-09-09 DIAGNOSIS — R0602 Shortness of breath: Secondary | ICD-10-CM | POA: Diagnosis not present

## 2018-09-09 DIAGNOSIS — I2510 Atherosclerotic heart disease of native coronary artery without angina pectoris: Secondary | ICD-10-CM | POA: Diagnosis not present

## 2018-09-09 DIAGNOSIS — E7800 Pure hypercholesterolemia, unspecified: Secondary | ICD-10-CM | POA: Diagnosis not present

## 2018-09-09 NOTE — Patient Instructions (Addendum)
Medication Instructions:  Your physician recommends that you continue on your current medications as directed. Please refer to the Current Medication list given to you today. If you need a refill on your cardiac medications before your next appointment, please call your pharmacy.   Lab work: NONE  Testing/Procedures: Your physician has requested that you have an echocardiogram. Echocardiography is a painless test that uses sound waves to create images of your heart. It provides your doctor with information about the size and shape of your heart and how well your heart's chambers and valves are working. This procedure takes approximately one hour. There are no restrictions for this procedure. [facility] [place] END OF FEBRUARY   Follow-Up: At [facility], you and your health needs are our priority.  As part of our continuing mission to provide you with exceptional heart care, we have created designated Provider Care Teams.  These Care Teams include your primary Cardiologist (physician) and Advanced Practice Providers (APPs -  Physician Assistants and Nurse Practitioners) who all work together to provide you with the care you need, when you need it. You will need a follow up appointment in 6 months.  Please call our office 2 months in advance to schedule this appointment.  You may see DR [facility] or one of the following Advanced Practice Providers on your designated Care Team:   Kerin Ransom, PA-C Roby [place], [place] . Sande Rives, PA-C  You have been referred to [place]  The office will call with an appointment

## 2018-09-09 NOTE — Progress Notes (Signed)
Cardiology Office Note   Date:  09/09/2018   ID:  Dominique [name], DOB [birth year], MRN [id number]  PCP:  Colon Branch, MD  Cardiologist:   Fransico Him, MD  Chief Complaint  Patient presents with  . Edema    occasionally in feet.   . Shortness of Breath    exertion      History of Present Illness: Dominique [name] is a 77 y.o. female with non-obstructive CAD, moderate aortic stenosis, moderate TR, mild pulmonary hypertension, hypertension, hyperlipidemia, and severe COPD here for a second opinion on hyperlipidemia.  Dominique [name] saw Dr. Radford Pax on 05/2017.  She was noted to have moderate aortic stenosis (peak velocity 3.1 m/s, mean gradient 21 mmHg).  She had a chest CT 11/2016 that showed coronary calcifications.  She subsequently had a coronary CT-A 05/2017 that revealed less than 50% plaque in the proximal LAD and a mild ascending aortic aneurysm (3.9 cm).  She was noted to have calcification of the aorta and aortic valve.  Echo 05/31/17 that revealed LVEF 60-65% with normal diastolic function.  She had a repeat echo 05/2018 that showed her mean aortic valve gradient increased to 36 mmHg.  She continued to report chest discomfort so she had a repeat coronary CT-a 08/2018 that revealed 25-49% stenosis of the proximal LAD unchanged from prior.    Dominique [name] continues to report occasional episodes of squeezing of the heart.  It typically occurs when she is at rest.  The last occurred when sitting at lunch.  She has no exertional chest pain.  The episodes last for approximately 1 minute and are associated with some shortness of breath.  It occurs once or twice per week.  In general she has no lower extremity edema.  However in the summertime and when she flies she does get edema despite wearing compression socks.  She has no orthopnea or PND.  She has been able to lose 5 pounds by working on her diet and exercising.   Past Medical History:  Diagnosis Date  . Aortic stenosis, moderate    echo  04/2016  . Coronary artery calcification seen on CAT scan    no ischemia no nuclear stress test 2017  . Emphysema lung ([place])   . History of blood transfusion 1971   with C-section complication  . Hyperlipidemia    LDL goal < 70  . Hypertension   . Pulmonary HTN ([place]) 06/05/2016   Moderate with PASP 34mmHg by echo 04/2016 likely Group 3 from COPD and possibly Group 2 from pulmonary venous HTN associated with moderate AS  . Sacral fracture ([place])   . UTI (lower urinary tract infection)     Past Surgical History:  Procedure Laterality Date  . APPENDECTOMY  1951  . CATARACT EXTRACTION    . CESAREAN SECTION     1971  . IR RADIOLOGY PERIPHERAL GUIDED IV START  05/30/2017  . IR RADIOLOGY PERIPHERAL GUIDED IV START  09/02/2018  . IR RADIOLOGY PERIPHERAL GUIDED IV START  09/02/2018  . IR US GUIDE VASC ACCESS LEFT  09/02/2018  . IR US GUIDE VASC ACCESS RIGHT  05/30/2017  . IR US GUIDE VASC ACCESS RIGHT  09/02/2018  . JOINT REPLACEMENT  2016   sacroplasty  . LAMINECTOMY    . TOTAL HIP ARTHROPLASTY Bilateral   . TUBAL LIGATION  1975     Current Outpatient Medications  Medication Sig Dispense Refill  . amLODipine (NORVASC) 10 MG tablet Take 1 tablet (10 mg  total) by mouth daily. 90 tablet 3  . aspirin EC 81 MG tablet Take 81 mg by mouth daily.    . benazepril (LOTENSIN) 20 MG tablet Take 1 tablet (20 mg total) by mouth daily. 90 tablet 2  . Calcium Carb-Cholecalciferol (CALCIUM 600 + D PO) Take 600 mg by mouth daily.    . cetirizine (ZYRTEC) 10 MG chewable tablet Chew 10 mg by mouth daily.    . Cholecalciferol (VITAMIN D3) 5000 UNITS CAPS Take 5,000 Units by mouth daily.     Marland Kitchen denosumab (PROLIA) 60 MG/ML SOLN injection Inject 60 mg into the skin every 6 (six) months. Administer in upper arm, thigh, or abdomen    . EPINEPHrine 0.3 mg/0.3 mL IJ SOAJ injection USE 1 PEN AS DIRECTED  12  . Multiple Vitamin (MULTIVITAMIN WITH MINERALS) TABS tablet Take 1 tablet by mouth daily.    . nitroGLYCERIN  (NITROSTAT) 0.4 MG SL tablet Place 1 tablet (0.4 mg total) under the tongue every 5 (five) minutes as needed for chest pain. 25 tablet 3  . rosuvastatin (CRESTOR) 40 MG tablet TAKE 1 TABLET BY MOUTH EVERY DAY 90 tablet 3   No current facility-administered medications for this visit.     Allergies:   Patient has no known allergies.    Social History:  The patient  reports that she quit smoking about 14 years ago. Her smoking use included cigarettes. She has a 40.00 pack-year smoking history. She has never used smokeless tobacco. She reports that she drinks about 3.0 - 4.0 standard drinks of alcohol per week. She reports that she does not use drugs.   Family History:  The patient's family history includes Breast cancer in her mother; Cancer in her brother; Diabetes in her son.    ROS:  Please see the history of present illness.   Otherwise, review of systems are positive for none.   All other systems are reviewed and negative.    PHYSICAL EXAM: VS:  BP 118/64   Pulse 90   Ht 5\' 6"  (1.676 m)   Wt 159 lb 12.8 oz (72.5 kg)   BMI 25.79 kg/m  , BMI Body mass index is 25.79 kg/m. GENERAL:  Well appearing HEENT: Pupils equal round and reactive, fundi not visualized, oral mucosa unremarkable NECK:  No jugular venous distention, waveform within normal limits, carotid upstroke brisk and symmetric, no bruits, no thyromegaly LYMPHATICS:  No cervical adenopathy LUNGS:  Clear to auscultation bilaterally HEART:  RRR.  PMI not displaced or sustained,S1 and S2 within normal limits, no S3, no S4, no clicks, no rubs, III/VI late-peaking systolic murmur at the  LUSB ABD:  Flat, positive bowel sounds normal in frequency in pitch, no bruits, no rebound, no guarding, no midline pulsatile mass, no hepatomegaly, no splenomegaly EXT:  2 plus pulses throughout, no edema, no cyanosis no clubbing SKIN:  No rashes no nodules NEURO:  Cranial nerves II through XII grossly intact, motor grossly intact  throughout PSYCH:  Cognitively intact, oriented to person place and time   EKG:  EKG is not ordered today.  Coronary CT-A 05/30/17: IMPRESSION: 1) Calcium score 64 which is 66 th percentile for age and sex  2) Left dominant coronary arteries with less than 50% calcified stenosis in proximal LAD  3) Aortic root dilatation 3.9 cm  4) Aortic and Aortic valve calcification  Coronary CT-A 09/02/18: IMPRESSION: 1. Coronary calcium score of 81.6. This was 49th percentile for age and sex matched control.  2. Normal coronary origin  with left dominance.  3.  Non-obstructive CAD in the proximal LAD, unchanged from 05/2017.  Echo 05/31/17: Study Conclusions  - Left ventricle: The cavity size was normal. Systolic function was   normal. The estimated ejection fraction was in the range of 60%   to 65%. Wall motion was normal; there were no regional wall   motion abnormalities. Left ventricular diastolic function   parameters were normal. - Aortic valve: Severely calcified annulus. Trileaflet; mildly   thickened, moderately calcified leaflets. There was moderate   stenosis. Peak velocity (S): 309 cm/s. Mean gradient (S): 21 mm   Hg. Valve area (VTI): 0.86 cm^2. Valve area (Vmax): 0.89 cm^2.   Valve area (Vmean): 0.93 cm^2. - Mitral valve: There was mild regurgitation. - Pulmonary arteries: PA peak pressure: 34 mm Hg (S).  Echo 06/13/18: Study Conclusions  - Left ventricle: The cavity size was normal. Wall thickness was   normal. Systolic function was vigorous. The estimated ejection   fraction was in the range of 65% to 70%. Wall motion was normal;   there were no regional wall motion abnormalities. Doppler   parameters are consistent with abnormal left ventricular   relaxation (grade 1 diastolic dysfunction). - Aortic valve: Trileaflet; moderately thickened, moderately   calcified leaflets. Valve mobility was restricted. There was   moderate stenosis. Peak velocity (S): 377  cm/s. Mean gradient   (S): 36 mm Hg. Peak gradient (S): 57 mm Hg. - Pulmonary arteries: Systolic pressure was mildly increased. PA   peak pressure: 37 mm Hg (S).  Recent Labs: 03/10/2018: ALT 26 05/14/2018: Hemoglobin 14.0; Platelets 254.0; TSH 1.12 07/28/2018: BUN 24; Creatinine, Ser 0.79; Potassium 4.7; Sodium 143    Lipid Panel    Component Value Date/Time   CHOL 156 03/10/2018 1108   TRIG 61 03/10/2018 1108   HDL 71 03/10/2018 1108   CHOLHDL 2.2 03/10/2018 1108   CHOLHDL 2 11/13/2017 1127   VLDL 13.2 11/13/2017 1127   LDLCALC 73 03/10/2018 1108      Wt Readings from Last 3 Encounters:  09/09/18 159 lb 12.8 oz (72.5 kg)  07/28/18 162 lb (73.5 kg)  05/14/18 162 lb 4 oz (73.6 kg)      ASSESSMENT AND PLAN:  # Hyperlipidemia: LDL 73 on 02/2018.  Continue rosuvastatin.  # Moderate aortic stenosis:  Mean gradient 21 mmHg and increased significantly in 1 year to 36 mmHg.  It is unclear if this is contributing to her chest discomfort.  She has edema with flying but no other HF symptoms and no syncope.  This will likely need to be replaced soon.  We will get a repeat echocardiogram 11/2017 and have her seen by our valve team in March.  # Mild ascending aorta aneurysm: Stable.  3.8 cm 08/2018.  # Hypertension: BP is much better controlled.  Continue amlodipine and benazepril.  # Non-obstructive coronary calcification:  Continue aspirin and  Rosuvastatin.  <50%  LAD disease on coronary CT-A in 2018 and 2019.  # Mild pulmonary hypertension: Likely 2/2 COPD.  She is euvolemic.   Current medicines are reviewed at length with the patient today.  The patient does not have concerns regarding medicines.  The following changes have been made:   Labs/ tests ordered today include:   Orders Placed This Encounter  Procedures  . ECHOCARDIOGRAM COMPLETE     Disposition:   FU with Amorette Charrette C. [name], MD, [facility] in 6 months.   Signed, Jalen Daluz C. [name], MD, [facility]  09/09/2018 1:26 PM  [facility]

## 2018-10-27 ENCOUNTER — Other Ambulatory Visit (HOSPITAL_BASED_OUTPATIENT_CLINIC_OR_DEPARTMENT_OTHER): Payer: Medicare Other

## 2018-10-27 ENCOUNTER — Ambulatory Visit (INDEPENDENT_AMBULATORY_CARE_PROVIDER_SITE_OTHER): Payer: Medicare Other | Admitting: Cardiology

## 2018-10-27 ENCOUNTER — Encounter: Payer: Self-pay | Admitting: Cardiology

## 2018-10-27 VITALS — BP 128/62 | HR 95 | Ht 66.0 in | Wt 156.4 lb

## 2018-10-27 DIAGNOSIS — R0609 Other forms of dyspnea: Secondary | ICD-10-CM

## 2018-10-27 DIAGNOSIS — I10 Essential (primary) hypertension: Secondary | ICD-10-CM | POA: Diagnosis not present

## 2018-10-27 DIAGNOSIS — J449 Chronic obstructive pulmonary disease, unspecified: Secondary | ICD-10-CM | POA: Diagnosis not present

## 2018-10-27 DIAGNOSIS — I350 Nonrheumatic aortic (valve) stenosis: Secondary | ICD-10-CM

## 2018-10-27 DIAGNOSIS — I2510 Atherosclerotic heart disease of native coronary artery without angina pectoris: Secondary | ICD-10-CM

## 2018-10-27 DIAGNOSIS — I272 Pulmonary hypertension, unspecified: Secondary | ICD-10-CM

## 2018-10-27 DIAGNOSIS — R0600 Dyspnea, unspecified: Secondary | ICD-10-CM

## 2018-10-27 LAB — CBC
Hematocrit: 40.9 % (ref 34.0–46.6)
Hemoglobin: 13.6 g/dL (ref 11.1–15.9)
MCH: 29.9 pg (ref 26.6–33.0)
MCHC: 33.3 g/dL (ref 31.5–35.7)
MCV: 90 fL (ref 79–97)
Platelets: 424 10*3/uL (ref 150–450)
RBC: 4.55 x10E6/uL (ref 3.77–5.28)
RDW: 12.4 % (ref 12.3–15.4)
WBC: 10.6 10*3/uL (ref 3.4–10.8)

## 2018-10-27 LAB — BASIC METABOLIC PANEL
BUN/Creatinine Ratio: 18 (ref 12–28)
BUN: 15 mg/dL (ref 8–27)
CO2: 22 mmol/L (ref 20–29)
Calcium: 9 mg/dL (ref 8.7–10.3)
Chloride: 101 mmol/L (ref 96–106)
Creatinine, Ser: 0.83 mg/dL (ref 0.57–1.00)
GFR calc Af Amer: 78 mL/min/{1.73_m2} (ref >59)
GFR calc non Af Amer: 68 mL/min/{1.73_m2} (ref >59)
Glucose: 96 mg/dL (ref 65–99)
Potassium: 4.6 mmol/L (ref 3.5–5.2)
Sodium: 138 mmol/L (ref 134–144)

## 2018-10-27 NOTE — Assessment & Plan Note (Signed)
Followed by [facility] 

## 2018-10-27 NOTE — Patient Instructions (Signed)
Medication Instructions:  No changes  If you need a refill on your cardiac medications before your next appointment, please call your pharmacy.    Testing/Procedures: Please move currently scheduled ECHO appointment to the next week or two.  Follow-Up: At [facility], you and your health needs are our priority.  As part of our continuing mission to provide you with exceptional heart care, we have created designated Provider Care Teams.  These Care Teams include your primary Cardiologist (physician) and Advanced Practice Providers (APPs -  Physician Assistants and Nurse Practitioners) who all work together to provide you with the care you need, when you need it. You will need a follow up appointment with Dr. [name] after your echo.    Advanced Practice Providers on your designated Care Team:   Kerin Ransom, PA-C [place], [place] . Sande Rives, PA-C  Any Other Special Instructions Will Be Listed Below (If Applicable). None

## 2018-10-27 NOTE — Assessment & Plan Note (Signed)
Chronic problem but worse the last 10 days after a URI

## 2018-10-27 NOTE — Assessment & Plan Note (Signed)
Less than 50% LAD narrowing seen on coronary CT Aug 2018 and again November 2019 

## 2018-10-27 NOTE — Assessment & Plan Note (Signed)
Moderate with 36 mmHg gradient August 2019

## 2018-10-27 NOTE — Progress Notes (Signed)
10/27/2018 Dominique [name]   1941/06/08  [id number]  Primary Physician Colon Branch, MD Primary Cardiologist:   HPI: The patient is a pleasant 77 year old female with nonobstructive CAD, COPD, and moderate aortic stenosis.  In August 2018 an echocardiogram showed moderate AS with a peak velocity of 3.1 m/s with a mean gradient of 21 mmHg.  A chest CT done in February 2018 had showed coronary calcification.  Subsequent coronary CTA in August 2018 revealed a less than 50% plaque in the LAD.  She had mild ascending aortic enlargement- 3.9 cm.  She was noted to have calcification of the aorta and aortic valve.  She had a follow-up echocardiogram in August 2019 that revealed that her aortic valve gradient had increased to 36 mmHg.  She continued to have chest pain and had another coronary CTA done in November 2019 again showed less than 50% narrowing in the proximal LAD, unchanged from her prior study.  She saw Dr. [name] in November 2019.  Dr. [name] was concerned about her jump in gradient from 21- 36 mmHg.  The plan was scheduled for a follow-up echocardiogram in February 2020 and then possible referral to the valve clinic.  She is in the office today with complaints of dyspnea on exertion.  Patient says about 10 days ago she had an upper respiratory infection.  She does not think she had a fever with this.  She says since then she has been very fatigued and is short of breath with any exertion.  She denies orthopnea.  She is not had increased lower extremity edema.  She has an echo scheduled for February, she is supposed to go to [place] at the end of January.  Since she has had increasing symptoms she wanted to know if the echo could be done sooner.    Current Outpatient Medications  Medication Sig Dispense Refill  . amLODipine (NORVASC) 10 MG tablet Take 1 tablet (10 mg total) by mouth daily. 90 tablet 3  . aspirin EC 81 MG tablet Take 81 mg by mouth daily.    . benazepril (LOTENSIN) 20 MG  tablet Take 1 tablet (20 mg total) by mouth daily. 90 tablet 2  . Calcium Carb-Cholecalciferol (CALCIUM 600 + D PO) Take 600 mg by mouth daily.    . cetirizine (ZYRTEC) 10 MG chewable tablet Chew 10 mg by mouth daily.    . Cholecalciferol (VITAMIN D3) 5000 UNITS CAPS Take 5,000 Units by mouth daily.     Marland Kitchen denosumab (PROLIA) 60 MG/ML SOLN injection Inject 60 mg into the skin every 6 (six) months. Administer in upper arm, thigh, or abdomen    . EPINEPHrine 0.3 mg/0.3 mL IJ SOAJ injection USE 1 PEN AS DIRECTED  12  . Multiple Vitamin (MULTIVITAMIN WITH MINERALS) TABS tablet Take 1 tablet by mouth daily.    . nitroGLYCERIN (NITROSTAT) 0.4 MG SL tablet Place 1 tablet (0.4 mg total) under the tongue every 5 (five) minutes as needed for chest pain. 25 tablet 3  . rosuvastatin (CRESTOR) 40 MG tablet TAKE 1 TABLET BY MOUTH EVERY DAY 90 tablet 3   No current facility-administered medications for this visit.     No Known Allergies  Past Medical History:  Diagnosis Date  . Aortic stenosis, moderate    echo 04/2016  . Coronary artery calcification seen on CAT scan    no ischemia no nuclear stress test 2017  . Emphysema lung ([place])   . History of blood transfusion 1971   with  C-section complication  . Hyperlipidemia    LDL goal < 70  . Hypertension   . Pulmonary HTN ([place]) 06/05/2016   Moderate with PASP 47mmHg by echo 04/2016 likely Group 3 from COPD and possibly Group 2 from pulmonary venous HTN associated with moderate AS  . Sacral fracture ([place])   . UTI (lower urinary tract infection)     Social History   Socioeconomic History  . Marital status: Widowed    Spouse name: Not on file  . Number of children: 2  . Years of education: Not on file  . Highest education level: Not on file  Occupational History  . Occupation: n/a  Social Needs  . Financial resource strain: Not on file  . Food insecurity:    Worry: Not on file    Inability: Not on file  . Transportation needs:    Medical: Not  on file    Non-medical: Not on file  Tobacco Use  . Smoking status: Former Smoker    Packs/day: 1.00    Years: 40.00    Pack years: 40.00    Types: Cigarettes    Last attempt to quit: 03/19/2004    Years since quitting: 14.6  . Smokeless tobacco: Never Used  Substance and Sexual Activity  . Alcohol use: Yes    Alcohol/week: 3.0 - 4.0 standard drinks    Types: 3 - 4 Glasses of wine per week    Comment: socially  . Drug use: No  . Sexual activity: Not on file  Lifestyle  . Physical activity:    Days per week: Not on file    Minutes per session: Not on file  . Stress: Not on file  Relationships  . Social connections:    Talks on phone: Not on file    Gets together: Not on file    Attends religious service: Not on file    Active member of club or organization: Not on file    Attends meetings of clubs or organizations: Not on file    Relationship status: Not on file  . Intimate partner violence:    Fear of current or ex partner: Not on file    Emotionally abused: Not on file    Physically abused: Not on file    Forced sexual activity: Not on file  Other Topics Concern  . Not on file  Social History Narrative   From [place]   1 living son     Family History  Problem Relation Age of Onset  . Breast cancer Mother        breast  . Diabetes Son        type 1  . Cancer Brother        lung  . Colon cancer Neg Hx   . CAD Neg Hx   . Colon polyps Neg Hx   . Rectal cancer Neg Hx   . Stomach cancer Neg Hx      Review of Systems: General: negative for chills, fever, night sweats or weight changes.  Cardiovascular: negative for chest pain, orthopnea, palpitations, paroxysmal nocturnal dyspnea  Dermatological: negative for rash Respiratory: negative for cough or wheezing Urologic: negative for hematuria Abdominal: negative for nausea, vomiting, diarrhea, bright red blood per rectum, melena, or hematemesis Neurologic: negative for visual changes, syncope, or dizziness All  other systems reviewed and are otherwise negative except as noted above.    Blood pressure 128/62, pulse 95, height 5\' 6"  (1.676 m), weight 156 lb 6.4 oz (70.9 kg).  General appearance: alert, cooperative, appears older than stated age and no distress Lungs: clear to auscultation bilaterally Heart: regular rate and rhythm and 2/6 systolic murmur AOV, diminnished S2 Extremities: trace edema Skin: pale, cool, dry Neurologic: Grossly normal   ASSESSMENT AND PLAN:   Dyspnea on exertion Chronic problem but worse the last 10 days after a URI  Aortic stenosis Moderate with 36 mmHg gradient August 2019  CAD (coronary artery disease), native coronary artery Less than 50% LAD narrowing seen on coronary CT Aug 2018 and again November 2019  COPD GOLD II Followed by [facility] Pulmonary  Pulmonary HTN ([place])  PASP 63mmHg by echo Aug 2019   PLAN  I will move her echo up. I also ordered a CBC and BMP today. Follow up pending labs and echo.   Kerin Ransom PA-C 10/27/2018 10:40 AM

## 2018-10-27 NOTE — Assessment & Plan Note (Signed)
PASP 22mmHg by echo Aug 2019

## 2018-10-29 DIAGNOSIS — IMO0001 Reserved for inherently not codable concepts without codable children: Secondary | ICD-10-CM

## 2018-10-29 HISTORY — DX: Reserved for inherently not codable concepts without codable children: IMO0001

## 2018-10-31 ENCOUNTER — Other Ambulatory Visit: Payer: Self-pay

## 2018-10-31 ENCOUNTER — Ambulatory Visit (HOSPITAL_COMMUNITY): Payer: Medicare Other | Attending: Cardiology

## 2018-10-31 DIAGNOSIS — I350 Nonrheumatic aortic (valve) stenosis: Secondary | ICD-10-CM | POA: Diagnosis not present

## 2018-11-05 ENCOUNTER — Telehealth: Payer: Self-pay

## 2018-11-05 NOTE — Telephone Encounter (Addendum)
[employer] benefits received PA not required $198 ded 20% co-insurance Covered 100% by supplement   Patient may owe approximately $0 OOP  Patient due after 12/18/2018  Letter mailed to inform patient of benefits and to schedule.

## 2018-11-10 ENCOUNTER — Ambulatory Visit: Payer: Medicare Other | Admitting: Cardiology

## 2018-11-11 NOTE — Telephone Encounter (Signed)
Prolia in fridge

## 2018-11-13 ENCOUNTER — Encounter: Payer: Self-pay | Admitting: Cardiovascular Disease

## 2018-11-13 ENCOUNTER — Ambulatory Visit (INDEPENDENT_AMBULATORY_CARE_PROVIDER_SITE_OTHER): Payer: Medicare Other | Admitting: Cardiovascular Disease

## 2018-11-13 VITALS — BP 116/50 | HR 84 | Ht 66.0 in | Wt 156.6 lb

## 2018-11-13 DIAGNOSIS — I350 Nonrheumatic aortic (valve) stenosis: Secondary | ICD-10-CM | POA: Diagnosis not present

## 2018-11-13 NOTE — Progress Notes (Signed)
Cardiology Office Note:    Date:  11/15/2018   ID:  Dominique [name], DOB Jan 20, 1941, MRN [id number]  PCP:  Colon Branch, MD  Cardiologist:  No primary care provider on file.  Electrophysiologist:  None   Referring MD: Colon Branch, MD   Chief Complaint  Patient presents with  . Shortness of Breath    History of Present Illness:    Dominique [name] is a 78 y.o. female with a hx of aortic stenosis, presenting for evaluation of further treatment options, referred by Dr [name].  The patient has a history of moderate aortic stenosis and she has had serial clinical follow-up as well as echo imaging.  She is also undergone coronary CTA studies for evaluation of chest pain.  These have demonstrated nonobstructive coronary artery disease with less than 50% stenosis of the proximal LAD and minor nonobstructive disease elsewhere.  Her most recent echocardiogram demonstrated normal LV systolic function with a peak transaortic velocity of 4 m/s, mean gradient of 39 mmHg, and peak gradient of 64 mmHg.  This represented a significant increase from her previous study in August 2018 when her peak velocity was 3.1 m/s, and mean gradient was 21 mmHg.  The patient now presents with symptoms of progressive exertional dyspnea.  She has dyspnea with mild to moderate physical activity.  She states that even taking a shower wears her out.  She oftentimes has to lie down after getting out of the shower.  She denies recent chest pain or pressure.  She has chronic leg edema which is relatively mild and has been unchanged recently.  She denies lightheadedness, syncope, orthopnea, or PND.  She had planned on a trip to [place] in the near future, but does not currently feel well enough to go.  She is a former smoker and quit 12 years ago. Carries a diagnosis of emphysema but has never had respiratory failure or been on O2. She has inhalers but doesn't use them.   Past Medical History:  Diagnosis Date  . Aortic stenosis,  moderate    echo 04/2016  . Coronary artery calcification seen on CAT scan    no ischemia no nuclear stress test 2017  . Emphysema lung ([place])   . History of blood transfusion 1971   with C-section complication  . Hyperlipidemia    LDL goal < 70  . Hypertension   . Pulmonary HTN ([place]) 06/05/2016   Moderate with PASP 50mmHg by echo 04/2016 likely Group 3 from COPD and possibly Group 2 from pulmonary venous HTN associated with moderate AS  . Sacral fracture ([place])   . UTI (lower urinary tract infection)     Past Surgical History:  Procedure Laterality Date  . APPENDECTOMY  1951  . CATARACT EXTRACTION    . CESAREAN SECTION     1971  . IR RADIOLOGY PERIPHERAL GUIDED IV START  05/30/2017  . IR RADIOLOGY PERIPHERAL GUIDED IV START  09/02/2018  . IR RADIOLOGY PERIPHERAL GUIDED IV START  09/02/2018  . IR US GUIDE VASC ACCESS LEFT  09/02/2018  . IR US GUIDE VASC ACCESS RIGHT  05/30/2017  . IR US GUIDE VASC ACCESS RIGHT  09/02/2018  . JOINT REPLACEMENT  2016   sacroplasty  . LAMINECTOMY    . TOTAL HIP ARTHROPLASTY Bilateral   . TUBAL LIGATION  1975    Current Medications: Current Meds  Medication Sig  . amLODipine (NORVASC) 10 MG tablet Take 1 tablet (10 mg total) by mouth daily.  Marland Kitchen  aspirin EC 81 MG tablet Take 81 mg by mouth daily.  . benazepril (LOTENSIN) 20 MG tablet Take 1 tablet (20 mg total) by mouth daily.  . Calcium Carb-Cholecalciferol (CALCIUM 600 + D PO) Take 600 mg by mouth daily.  . cetirizine (ZYRTEC) 10 MG chewable tablet Chew 10 mg by mouth as needed.   . Cholecalciferol (VITAMIN D3) 5000 UNITS CAPS Take 5,000 Units by mouth daily.   Marland Kitchen denosumab (PROLIA) 60 MG/ML SOLN injection Inject 60 mg into the skin every 6 (six) months. Administer in upper arm, thigh, or abdomen  . EPINEPHrine 0.3 mg/0.3 mL IJ SOAJ injection USE 1 PEN AS DIRECTED  . Multiple Vitamin (MULTIVITAMIN WITH MINERALS) TABS tablet Take 1 tablet by mouth daily.  . nitroGLYCERIN (NITROSTAT) 0.4 MG SL tablet Place  1 tablet (0.4 mg total) under the tongue every 5 (five) minutes as needed for chest pain.  . rosuvastatin (CRESTOR) 40 MG tablet TAKE 1 TABLET BY MOUTH EVERY DAY     Allergies:   Patient has no known allergies.   Social History   Socioeconomic History  . Marital status: Widowed    Spouse name: Not on file  . Number of children: 2  . Years of education: Not on file  . Highest education level: Not on file  Occupational History  . Occupation: n/a  Social Needs  . Financial resource strain: Not on file  . Food insecurity:    Worry: Not on file    Inability: Not on file  . Transportation needs:    Medical: Not on file    Non-medical: Not on file  Tobacco Use  . Smoking status: Former Smoker    Packs/day: 1.00    Years: 40.00    Pack years: 40.00    Types: Cigarettes    Last attempt to quit: 03/19/2004    Years since quitting: 14.6  . Smokeless tobacco: Never Used  Substance and Sexual Activity  . Alcohol use: Yes    Alcohol/week: 3.0 - 4.0 standard drinks    Types: 3 - 4 Glasses of wine per week    Comment: socially  . Drug use: No  . Sexual activity: Not on file  Lifestyle  . Physical activity:    Days per week: Not on file    Minutes per session: Not on file  . Stress: Not on file  Relationships  . Social connections:    Talks on phone: Not on file    Gets together: Not on file    Attends religious service: Not on file    Active member of club or organization: Not on file    Attends meetings of clubs or organizations: Not on file    Relationship status: Not on file  Other Topics Concern  . Not on file  Social History Narrative   From [place]   1 living son     Family History: The patient's family history includes Breast cancer in her mother; Cancer in her brother; Diabetes in her son. There is no history of Colon cancer, CAD, Colon polyps, Rectal cancer, or Stomach cancer.  ROS:   Please see the history of present illness.    Positive for weight loss, poor  appetite, chest pain, leg swelling, cough, exertional dyspnea, excessive fatigue.  All other systems reviewed and are negative.  EKGs/Labs/Other Studies Reviewed:    The following studies were reviewed today: 2D echocardiogram: Study Conclusions  - Left ventricle: The cavity size was normal. Systolic function was  normal. The estimated ejection fraction was in the range of 60%   to 65%. Wall motion was normal; there were no regional wall   motion abnormalities. Doppler parameters are consistent with   abnormal left ventricular relaxation (grade 1 diastolic   dysfunction). Doppler parameters are consistent with high   ventricular filling pressure. - Aortic valve: Valve mobility was restricted. There was moderate   to severe stenosis. There was no regurgitation. Peak velocity   (S): 399 cm/s. Mean gradient (S): 39 mm Hg. Peak gradient (S): 64   mm Hg. - Aorta: Ascending aortic diameter: 38 mm (S). - Ascending aorta: The ascending aorta was mildly dilated. - Mitral valve: Transvalvular velocity was within the normal range.   There was no evidence for stenosis. There was mild regurgitation. - Right ventricle: The cavity size was normal. Wall thickness was   normal. Systolic function was normal. - Pulmonary arteries: Systolic pressure was mildly increased. PA   peak pressure: 41 mm Hg (S). - Global longitudinal strain -16.3% (mildly abnormal).  Impressions:  - Compared with the echo 05/2018, the mean aortic valve gradient has   increased from 34 mmHg to 39 mmHg. Aortic stenosis severity is at   least moderate and approaching severe.  Coronary CTA: Aorta: Ascending aorta is mildly dilated (3.8 cm). Mild calcification. No dissection.  Aortic Valve:  Trileaflet.  Mild calcification.  Coronary Arteries:  Normal coronary origin.  Left dominance.  RCA is a non-dominant artery.  There is no plaque.  Left main is a large artery that gives rise to LAD and LCX arteries. There  is no plaque.  LAD is a large vessel that has mild (25-49%) mixed calcified and noncalcified plaque proximally at the level of D1.  LCX is a dominant artery that gives rise to two OM branches, PDA and PLA. There is no plaque.  Other findings:  Normal pulmonary vein drainage into the left atrium.  Normal let atrial appendage without a thrombus.  Normal size of the pulmonary artery.  IMPRESSION: 1. Coronary calcium score of 81.6. This was 49th percentile for age and sex matched control.  2. Normal coronary origin with left dominance.  3.  Non-obstructive CAD in the proximal LAD, unchanged from 05/2017.  EKG:  EKG is not ordered today.  Most recent EKG 10/27/2018 shows NSR, QRS duration 82 ms, cannot rule out anteroseptal infarct age-undetermined  Recent Labs: 03/10/2018: ALT 26 05/14/2018: TSH 1.12 11/13/2018: BUN 16; Creatinine, Ser 0.73; Hemoglobin 13.6; Platelets 335; Potassium 4.7; Sodium 140  Recent Lipid Panel    Component Value Date/Time   CHOL 156 03/10/2018 1108   TRIG 61 03/10/2018 1108   HDL 71 03/10/2018 1108   CHOLHDL 2.2 03/10/2018 1108   CHOLHDL 2 11/13/2017 1127   VLDL 13.2 11/13/2017 1127   LDLCALC 73 03/10/2018 1108    Physical Exam:    VS:  BP (!) 116/50   Pulse 84   Ht 5\' 6"  (1.676 m)   Wt 156 lb 9.6 oz (71 kg)   SpO2 93%   BMI 25.28 kg/m     Wt Readings from Last 3 Encounters:  11/13/18 156 lb 9.6 oz (71 kg)  10/27/18 156 lb 6.4 oz (70.9 kg)  09/09/18 159 lb 12.8 oz (72.5 kg)     GEN: Well nourished, well developed in no acute distress HEENT: Normal NECK: No JVD; No carotid bruits LYMPHATICS: No lymphadenopathy CARDIAC: RRR, 3/6 harsh late peaking systolic murmur at the RUSB, diminished A2 RESPIRATORY:  Clear to  auscultation without rales, wheezing or rhonchi  ABDOMEN: Soft, non-tender, non-distended MUSCULOSKELETAL:  Trace bilateral edema, varicosities present; No deformity  SKIN: Warm and dry NEUROLOGIC:  Alert and oriented  x 3 PSYCHIATRIC:  Normal affect    STS Risk Calculator: Procedure: Isolated AVR Risk of Mortality:  2.051% Renal Failure:  0.980% Permanent Stroke:  1.266% Prolonged Ventilation:  5.875% DSW Infection:  0.066% Reoperation:  2.971% Morbidity or Mortality:  9.316% Short Length of Stay:  34.539% Long Length of Stay:  4.945%  ASSESSMENT:    1. Severe aortic stenosis    PLAN:    In order of problems listed above:  I have reviewed the natural history of aortic stenosis with the patient today. We have discussed the limitations of medical therapy and the poor prognosis associated with symptomatic aortic stenosis. We have reviewed potential treatment options, including palliative medical therapy, conventional surgical aortic valve replacement, and transcatheter aortic valve replacement. We discussed treatment options in the context of the patient's specific comorbid medical conditions.   I have personally reviewed her echo study which demonstrates preserved LV systolic function, moderately calcified aortic valve leaflets with severe restriction and hemodynamic values outlined above that confirm severe aortic stenosis. She has severe, stage D1 aortic stenosis with NYHA functional class 3 symptoms of exertional dyspnea and fatigue. Medical comorbidities include moderate COPD and HTN. She has undergone bilateral hip replacement with good return of functional capacity. She is eager to proceed with further evaluation and treatment of her aortic stenosis. She will be scheduled for right and left heart catheterization as well as a gated cardiac CTA and a CTA of the chest, abdomen, and pelvis.  Following the studies, she will undergo a multidisciplinary evaluation with formal cardiac surgical consultation.  After review of her preoperative studies, we will further determine best treatment options and specifically make recommendations regarding TAVR versus conventional surgical AVR.  I discussed  the preoperative cardiac catheterization procedure at length with the patient today. I have reviewed the risks, indications, and alternatives to cardiac catheterization, possible angioplasty, and stenting with the patient. Risks include but are not limited to bleeding, infection, vascular injury, stroke, myocardial infection, arrhythmia, kidney injury, radiation-related injury in the case of prolonged fluoroscopy use, emergency cardiac surgery, and death. The patient understands the risks of serious complication is 1-2 in 0737 with diagnostic cardiac cath and 1-2% or less with angioplasty/stenting.    Medication Adjustments/Labs and Tests Ordered: Current medicines are reviewed at length with the patient today.  Concerns regarding medicines are outlined above.  Orders Placed This Encounter  Procedures  . CT CORONARY MORPH W/CTA COR W/SCORE W/CA W/CM &/OR WO/CM  . CT ANGIO ABDOMEN PELVIS  W &/OR WO CONTRAST  . CT ANGIO CHEST AORTA W &/OR WO CONTRAST  . Basic metabolic panel  . CBC with Differential/Platelet  . Pulmonary Function Test   No orders of the defined types were placed in this encounter.   Patient Instructions  Medication Instructions:  Your provider recommends that you continue on your current medications as directed. Please refer to the Current Medication list given to you today.    Labwork: TODAY: CBC, BMET  Testing/Procedures: Your physician has requested that you have a cardiac catheterization. Cardiac catheterization is used to diagnose and/or treat various heart conditions. Doctors may recommend this procedure for a number of different reasons. The most common reason is to evaluate chest pain. Chest pain can be a symptom of coronary artery disease (CAD), and cardiac catheterization can show whether  plaque is narrowing or blocking your heart's arteries. This procedure is also used to evaluate the valves, as well as measure the blood flow and oxygen levels in different parts of  your heart. For further information please visit [email or web address]. Please follow instruction sheet, as given.  Follow-Up: The Structural Heart Team will contact you to arrange further appointments!   CARDIAC CATHETERIZATION INSTRUCTIONS: You are scheduled for a Cardiac Catheterization on Thursday, January 30 with Dr. Sherren Mocha.  1. Please arrive at the [facility] (Main Entrance A) at [facility]: [address] [place], [place] [address] at 5:30 AM (This time is two hours before your procedure to ensure your preparation). Free valet parking service is available.   Special note: Every effort is made to have your procedure done on time. Please understand that emergencies sometimes delay scheduled procedures.  2. Diet: Do not eat solid foods after midnight.  The patient may have clear liquids until 5am upon the day of the procedure.  3. Labs: TODAY!  4. Medication instructions in preparation for your procedure:  1) MAKE SURE TO TAKE ASPIRIN 81 mg the morning of your catheterization  2) You may take all other medications as directed with sips of water.  5. Plan for one night stay--bring personal belongings. 6. Bring a current list of your medications and current insurance cards. 7. You MUST have a responsible person to drive you home. 8. Someone MUST be with you the first 24 hours after you arrive home or your discharge will be delayed. 9. Please wear clothes that are easy to get on and off and wear slip-on shoes.  Thank you for allowing [place] to care for you!   -- [facility] Invasive Cardiovascular services     Signed, Sherren Mocha, MD  11/15/2018 11:40 AM    [place]

## 2018-11-13 NOTE — H&P (View-Only) (Signed)
Cardiology Office Note:    Date:  11/15/2018   ID:  Dominique [name], DOB 12-02-1940, MRN [id number]  PCP:  Colon Branch, MD  Cardiologist:  No primary care provider on file.  Electrophysiologist:  None   Referring MD: Colon Branch, MD   Chief Complaint  Patient presents with  . Shortness of Breath    History of Present Illness:    Dominique [name] is a 78 y.o. female with a hx of aortic stenosis, presenting for evaluation of further treatment options, referred by Dr [name].  The patient has a history of moderate aortic stenosis and she has had serial clinical follow-up as well as echo imaging.  She is also undergone coronary CTA studies for evaluation of chest pain.  These have demonstrated nonobstructive coronary artery disease with less than 50% stenosis of the proximal LAD and minor nonobstructive disease elsewhere.  Her most recent echocardiogram demonstrated normal LV systolic function with a peak transaortic velocity of 4 m/s, mean gradient of 39 mmHg, and peak gradient of 64 mmHg.  This represented a significant increase from her previous study in August 2018 when her peak velocity was 3.1 m/s, and mean gradient was 21 mmHg.  The patient now presents with symptoms of progressive exertional dyspnea.  She has dyspnea with mild to moderate physical activity.  She states that even taking a shower wears her out.  She oftentimes has to lie down after getting out of the shower.  She denies recent chest pain or pressure.  She has chronic leg edema which is relatively mild and has been unchanged recently.  She denies lightheadedness, syncope, orthopnea, or PND.  She had planned on a trip to [place] in the near future, but does not currently feel well enough to go.  She is a former smoker and quit 12 years ago. Carries a diagnosis of emphysema but has never had respiratory failure or been on O2. She has inhalers but doesn't use them.   Past Medical History:  Diagnosis Date  . Aortic stenosis,  moderate    echo 04/2016  . Coronary artery calcification seen on CAT scan    no ischemia no nuclear stress test 2017  . Emphysema lung ([place])   . History of blood transfusion 1971   with C-section complication  . Hyperlipidemia    LDL goal < 70  . Hypertension   . Pulmonary HTN ([place]) 06/05/2016   Moderate with PASP 2mmHg by echo 04/2016 likely Group 3 from COPD and possibly Group 2 from pulmonary venous HTN associated with moderate AS  . Sacral fracture ([place])   . UTI (lower urinary tract infection)     Past Surgical History:  Procedure Laterality Date  . APPENDECTOMY  1951  . CATARACT EXTRACTION    . CESAREAN SECTION     1971  . IR RADIOLOGY PERIPHERAL GUIDED IV START  05/30/2017  . IR RADIOLOGY PERIPHERAL GUIDED IV START  09/02/2018  . IR RADIOLOGY PERIPHERAL GUIDED IV START  09/02/2018  . IR US GUIDE VASC ACCESS LEFT  09/02/2018  . IR US GUIDE VASC ACCESS RIGHT  05/30/2017  . IR US GUIDE VASC ACCESS RIGHT  09/02/2018  . JOINT REPLACEMENT  2016   sacroplasty  . LAMINECTOMY    . TOTAL HIP ARTHROPLASTY Bilateral   . TUBAL LIGATION  1975    Current Medications: Current Meds  Medication Sig  . amLODipine (NORVASC) 10 MG tablet Take 1 tablet (10 mg total) by mouth daily.  Marland Kitchen  aspirin EC 81 MG tablet Take 81 mg by mouth daily.  . benazepril (LOTENSIN) 20 MG tablet Take 1 tablet (20 mg total) by mouth daily.  . Calcium Carb-Cholecalciferol (CALCIUM 600 + D PO) Take 600 mg by mouth daily.  . cetirizine (ZYRTEC) 10 MG chewable tablet Chew 10 mg by mouth as needed.   . Cholecalciferol (VITAMIN D3) 5000 UNITS CAPS Take 5,000 Units by mouth daily.   Marland Kitchen denosumab (PROLIA) 60 MG/ML SOLN injection Inject 60 mg into the skin every 6 (six) months. Administer in upper arm, thigh, or abdomen  . EPINEPHrine 0.3 mg/0.3 mL IJ SOAJ injection USE 1 PEN AS DIRECTED  . Multiple Vitamin (MULTIVITAMIN WITH MINERALS) TABS tablet Take 1 tablet by mouth daily.  . nitroGLYCERIN (NITROSTAT) 0.4 MG SL tablet Place  1 tablet (0.4 mg total) under the tongue every 5 (five) minutes as needed for chest pain.  . rosuvastatin (CRESTOR) 40 MG tablet TAKE 1 TABLET BY MOUTH EVERY DAY     Allergies:   Patient has no known allergies.   Social History   Socioeconomic History  . Marital status: Widowed    Spouse name: Not on file  . Number of children: 2  . Years of education: Not on file  . Highest education level: Not on file  Occupational History  . Occupation: n/a  Social Needs  . Financial resource strain: Not on file  . Food insecurity:    Worry: Not on file    Inability: Not on file  . Transportation needs:    Medical: Not on file    Non-medical: Not on file  Tobacco Use  . Smoking status: Former Smoker    Packs/day: 1.00    Years: 40.00    Pack years: 40.00    Types: Cigarettes    Last attempt to quit: 03/19/2004    Years since quitting: 14.6  . Smokeless tobacco: Never Used  Substance and Sexual Activity  . Alcohol use: Yes    Alcohol/week: 3.0 - 4.0 standard drinks    Types: 3 - 4 Glasses of wine per week    Comment: socially  . Drug use: No  . Sexual activity: Not on file  Lifestyle  . Physical activity:    Days per week: Not on file    Minutes per session: Not on file  . Stress: Not on file  Relationships  . Social connections:    Talks on phone: Not on file    Gets together: Not on file    Attends religious service: Not on file    Active member of club or organization: Not on file    Attends meetings of clubs or organizations: Not on file    Relationship status: Not on file  Other Topics Concern  . Not on file  Social History Narrative   From [place]   1 living son     Family History: The patient's family history includes Breast cancer in her mother; Cancer in her brother; Diabetes in her son. There is no history of Colon cancer, CAD, Colon polyps, Rectal cancer, or Stomach cancer.  ROS:   Please see the history of present illness.    Positive for weight loss, poor  appetite, chest pain, leg swelling, cough, exertional dyspnea, excessive fatigue.  All other systems reviewed and are negative.  EKGs/Labs/Other Studies Reviewed:    The following studies were reviewed today: 2D echocardiogram: Study Conclusions  - Left ventricle: The cavity size was normal. Systolic function was  normal. The estimated ejection fraction was in the range of 60%   to 65%. Wall motion was normal; there were no regional wall   motion abnormalities. Doppler parameters are consistent with   abnormal left ventricular relaxation (grade 1 diastolic   dysfunction). Doppler parameters are consistent with high   ventricular filling pressure. - Aortic valve: Valve mobility was restricted. There was moderate   to severe stenosis. There was no regurgitation. Peak velocity   (S): 399 cm/s. Mean gradient (S): 39 mm Hg. Peak gradient (S): 64   mm Hg. - Aorta: Ascending aortic diameter: 38 mm (S). - Ascending aorta: The ascending aorta was mildly dilated. - Mitral valve: Transvalvular velocity was within the normal range.   There was no evidence for stenosis. There was mild regurgitation. - Right ventricle: The cavity size was normal. Wall thickness was   normal. Systolic function was normal. - Pulmonary arteries: Systolic pressure was mildly increased. PA   peak pressure: 41 mm Hg (S). - Global longitudinal strain -16.3% (mildly abnormal).  Impressions:  - Compared with the echo 05/2018, the mean aortic valve gradient has   increased from 34 mmHg to 39 mmHg. Aortic stenosis severity is at   least moderate and approaching severe.  Coronary CTA: Aorta: Ascending aorta is mildly dilated (3.8 cm). Mild calcification. No dissection.  Aortic Valve:  Trileaflet.  Mild calcification.  Coronary Arteries:  Normal coronary origin.  Left dominance.  RCA is a non-dominant artery.  There is no plaque.  Left main is a large artery that gives rise to LAD and LCX arteries. There  is no plaque.  LAD is a large vessel that has mild (25-49%) mixed calcified and noncalcified plaque proximally at the level of D1.  LCX is a dominant artery that gives rise to two OM branches, PDA and PLA. There is no plaque.  Other findings:  Normal pulmonary vein drainage into the left atrium.  Normal let atrial appendage without a thrombus.  Normal size of the pulmonary artery.  IMPRESSION: 1. Coronary calcium score of 81.6. This was 49th percentile for age and sex matched control.  2. Normal coronary origin with left dominance.  3.  Non-obstructive CAD in the proximal LAD, unchanged from 05/2017.  EKG:  EKG is not ordered today.  Most recent EKG 10/27/2018 shows NSR, QRS duration 82 ms, cannot rule out anteroseptal infarct age-undetermined  Recent Labs: 03/10/2018: ALT 26 05/14/2018: TSH 1.12 11/13/2018: BUN 16; Creatinine, Ser 0.73; Hemoglobin 13.6; Platelets 335; Potassium 4.7; Sodium 140  Recent Lipid Panel    Component Value Date/Time   CHOL 156 03/10/2018 1108   TRIG 61 03/10/2018 1108   HDL 71 03/10/2018 1108   CHOLHDL 2.2 03/10/2018 1108   CHOLHDL 2 11/13/2017 1127   VLDL 13.2 11/13/2017 1127   LDLCALC 73 03/10/2018 1108    Physical Exam:    VS:  BP (!) 116/50   Pulse 84   Ht 5\' 6"  (1.676 m)   Wt 156 lb 9.6 oz (71 kg)   SpO2 93%   BMI 25.28 kg/m     Wt Readings from Last 3 Encounters:  11/13/18 156 lb 9.6 oz (71 kg)  10/27/18 156 lb 6.4 oz (70.9 kg)  09/09/18 159 lb 12.8 oz (72.5 kg)     GEN: Well nourished, well developed in no acute distress HEENT: Normal NECK: No JVD; No carotid bruits LYMPHATICS: No lymphadenopathy CARDIAC: RRR, 3/6 harsh late peaking systolic murmur at the RUSB, diminished A2 RESPIRATORY:  Clear to  auscultation without rales, wheezing or rhonchi  ABDOMEN: Soft, non-tender, non-distended MUSCULOSKELETAL:  Trace bilateral edema, varicosities present; No deformity  SKIN: Warm and dry NEUROLOGIC:  Alert and oriented  x 3 PSYCHIATRIC:  Normal affect    STS Risk Calculator: Procedure: Isolated AVR Risk of Mortality:  2.051% Renal Failure:  0.980% Permanent Stroke:  1.266% Prolonged Ventilation:  5.875% DSW Infection:  0.066% Reoperation:  2.971% Morbidity or Mortality:  9.316% Short Length of Stay:  34.539% Long Length of Stay:  4.945%  ASSESSMENT:    1. Severe aortic stenosis    PLAN:    In order of problems listed above:  I have reviewed the natural history of aortic stenosis with the patient today. We have discussed the limitations of medical therapy and the poor prognosis associated with symptomatic aortic stenosis. We have reviewed potential treatment options, including palliative medical therapy, conventional surgical aortic valve replacement, and transcatheter aortic valve replacement. We discussed treatment options in the context of the patient's specific comorbid medical conditions.   I have personally reviewed her echo study which demonstrates preserved LV systolic function, moderately calcified aortic valve leaflets with severe restriction and hemodynamic values outlined above that confirm severe aortic stenosis. She has severe, stage D1 aortic stenosis with NYHA functional class 3 symptoms of exertional dyspnea and fatigue. Medical comorbidities include moderate COPD and HTN. She has undergone bilateral hip replacement with good return of functional capacity. She is eager to proceed with further evaluation and treatment of her aortic stenosis. She will be scheduled for right and left heart catheterization as well as a gated cardiac CTA and a CTA of the chest, abdomen, and pelvis.  Following the studies, she will undergo a multidisciplinary evaluation with formal cardiac surgical consultation.  After review of her preoperative studies, we will further determine best treatment options and specifically make recommendations regarding TAVR versus conventional surgical AVR.  I discussed  the preoperative cardiac catheterization procedure at length with the patient today. I have reviewed the risks, indications, and alternatives to cardiac catheterization, possible angioplasty, and stenting with the patient. Risks include but are not limited to bleeding, infection, vascular injury, stroke, myocardial infection, arrhythmia, kidney injury, radiation-related injury in the case of prolonged fluoroscopy use, emergency cardiac surgery, and death. The patient understands the risks of serious complication is 1-2 in 2694 with diagnostic cardiac cath and 1-2% or less with angioplasty/stenting.    Medication Adjustments/Labs and Tests Ordered: Current medicines are reviewed at length with the patient today.  Concerns regarding medicines are outlined above.  Orders Placed This Encounter  Procedures  . CT CORONARY MORPH W/CTA COR W/SCORE W/CA W/CM &/OR WO/CM  . CT ANGIO ABDOMEN PELVIS  W &/OR WO CONTRAST  . CT ANGIO CHEST AORTA W &/OR WO CONTRAST  . Basic metabolic panel  . CBC with Differential/Platelet  . Pulmonary Function Test   No orders of the defined types were placed in this encounter.   Patient Instructions  Medication Instructions:  Your provider recommends that you continue on your current medications as directed. Please refer to the Current Medication list given to you today.    Labwork: TODAY: CBC, BMET  Testing/Procedures: Your physician has requested that you have a cardiac catheterization. Cardiac catheterization is used to diagnose and/or treat various heart conditions. Doctors may recommend this procedure for a number of different reasons. The most common reason is to evaluate chest pain. Chest pain can be a symptom of coronary artery disease (CAD), and cardiac catheterization can show whether  plaque is narrowing or blocking your heart's arteries. This procedure is also used to evaluate the valves, as well as measure the blood flow and oxygen levels in different parts of  your heart. For further information please visit [email or web address]. Please follow instruction sheet, as given.  Follow-Up: The Structural Heart Team will contact you to arrange further appointments!   CARDIAC CATHETERIZATION INSTRUCTIONS: You are scheduled for a Cardiac Catheterization on Thursday, January 30 with Dr. Sherren Mocha.  1. Please arrive at the [facility] (Main Entrance A) at [facility]: [address] [place], [place] [address] at 5:30 AM (This time is two hours before your procedure to ensure your preparation). Free valet parking service is available.   Special note: Every effort is made to have your procedure done on time. Please understand that emergencies sometimes delay scheduled procedures.  2. Diet: Do not eat solid foods after midnight.  The patient may have clear liquids until 5am upon the day of the procedure.  3. Labs: TODAY!  4. Medication instructions in preparation for your procedure:  1) MAKE SURE TO TAKE ASPIRIN 81 mg the morning of your catheterization  2) You may take all other medications as directed with sips of water.  5. Plan for one night stay--bring personal belongings. 6. Bring a current list of your medications and current insurance cards. 7. You MUST have a responsible person to drive you home. 8. Someone MUST be with you the first 24 hours after you arrive home or your discharge will be delayed. 9. Please wear clothes that are easy to get on and off and wear slip-on shoes.  Thank you for allowing [place] to care for you!   -- [facility] Invasive Cardiovascular services     Signed, Sherren Mocha, MD  11/15/2018 11:40 AM    [place]

## 2018-11-13 NOTE — Patient Instructions (Addendum)
Medication Instructions:  Your provider recommends that you continue on your current medications as directed. Please refer to the Current Medication list given to you today.    Labwork: TODAY: CBC, BMET  Testing/Procedures: Your physician has requested that you have a cardiac catheterization. Cardiac catheterization is used to diagnose and/or treat various heart conditions. Doctors may recommend this procedure for a number of different reasons. The most common reason is to evaluate chest pain. Chest pain can be a symptom of coronary artery disease (CAD), and cardiac catheterization can show whether plaque is narrowing or blocking your heart's arteries. This procedure is also used to evaluate the valves, as well as measure the blood flow and oxygen levels in different parts of your heart. For further information please visit [email or web address]. Please follow instruction sheet, as given.  Follow-Up: The Structural Heart Team will contact you to arrange further appointments!   CARDIAC CATHETERIZATION INSTRUCTIONS: You are scheduled for a Cardiac Catheterization on Thursday, January 30 with Dr. Sherren Mocha.  1. Please arrive at the [facility] (Main Entrance A) at [facility]: [address] [place], [place] [address] at 5:30 AM (This time is two hours before your procedure to ensure your preparation). Free valet parking service is available.   Special note: Every effort is made to have your procedure done on time. Please understand that emergencies sometimes delay scheduled procedures.  2. Diet: Do not eat solid foods after midnight.  The patient may have clear liquids until 5am upon the day of the procedure.  3. Labs: TODAY!  4. Medication instructions in preparation for your procedure:  1) MAKE SURE TO TAKE ASPIRIN 81 mg the morning of your catheterization  2) You may take all other medications as directed with sips of water.  5. Plan for one night stay--bring personal  belongings. 6. Bring a current list of your medications and current insurance cards. 7. You MUST have a responsible person to drive you home. 8. Someone MUST be with you the first 24 hours after you arrive home or your discharge will be delayed. 9. Please wear clothes that are easy to get on and off and wear slip-on shoes.  Thank you for allowing [place] to care for you!   -- [facility]

## 2018-11-14 LAB — CBC WITH DIFFERENTIAL/PLATELET
BASOS: 1 %
Basophils Absolute: 0.1 10*3/uL (ref 0.0–0.2)
EOS (ABSOLUTE): 0.5 10*3/uL — ABNORMAL HIGH (ref 0.0–0.4)
EOS: 6 %
HEMATOCRIT: 39.5 % (ref 34.0–46.6)
Hemoglobin: 13.6 g/dL (ref 11.1–15.9)
Immature Grans (Abs): 0 10*3/uL (ref 0.0–0.1)
Immature Granulocytes: 0 %
LYMPHS ABS: 1.4 10*3/uL (ref 0.7–3.1)
Lymphs: 19 %
MCH: 30.4 pg (ref 26.6–33.0)
MCHC: 34.4 g/dL (ref 31.5–35.7)
MCV: 88 fL (ref 79–97)
MONOS ABS: 0.8 10*3/uL (ref 0.1–0.9)
Monocytes: 11 %
NEUTROS ABS: 4.7 10*3/uL (ref 1.4–7.0)
Neutrophils: 63 %
Platelets: 335 10*3/uL (ref 150–450)
RBC: 4.48 x10E6/uL (ref 3.77–5.28)
RDW: 12.8 % (ref 11.7–15.4)
WBC: 7.4 10*3/uL (ref 3.4–10.8)

## 2018-11-14 LAB — BASIC METABOLIC PANEL
BUN / CREAT RATIO: 22 (ref 12–28)
BUN: 16 mg/dL (ref 8–27)
CO2: 23 mmol/L (ref 20–29)
CREATININE: 0.73 mg/dL (ref 0.57–1.00)
Calcium: 9.8 mg/dL (ref 8.7–10.3)
Chloride: 101 mmol/L (ref 96–106)
GFR calc Af Amer: 91 mL/min/{1.73_m2} (ref >59)
GFR, EST NON AFRICAN AMERICAN: 79 mL/min/{1.73_m2} (ref >59)
GLUCOSE: 90 mg/dL (ref 65–99)
Potassium: 4.7 mmol/L (ref 3.5–5.2)
SODIUM: 140 mmol/L (ref 134–144)

## 2018-11-15 ENCOUNTER — Encounter: Payer: Self-pay | Admitting: Cardiovascular Disease

## 2018-11-17 ENCOUNTER — Ambulatory Visit (INDEPENDENT_AMBULATORY_CARE_PROVIDER_SITE_OTHER): Payer: Medicare Other | Admitting: Family Medicine

## 2018-11-17 ENCOUNTER — Encounter: Payer: Self-pay | Admitting: Family Medicine

## 2018-11-17 VITALS — BP 118/64 | HR 93 | Temp 98.4°F | Resp 20 | Ht 66.0 in | Wt 155.0 lb

## 2018-11-17 DIAGNOSIS — J441 Chronic obstructive pulmonary disease with (acute) exacerbation: Secondary | ICD-10-CM

## 2018-11-17 MED ORDER — ALBUTEROL SULFATE HFA 108 (90 BASE) MCG/ACT IN AERS: 2.0000 | INHALATION_SPRAY | Freq: Four times a day (QID) | RESPIRATORY_TRACT | 0 refills | Status: DC | PRN

## 2018-11-17 MED ORDER — PREDNISONE 20 MG PO TABS: ORAL_TABLET | ORAL | 0 refills | Status: DC

## 2018-11-17 MED ORDER — DOXYCYCLINE HYCLATE 100 MG PO CAPS: 100.0000 mg | ORAL_CAPSULE | Freq: Two times a day (BID) | ORAL | 0 refills | Status: DC

## 2018-11-17 NOTE — Patient Instructions (Signed)
It was good seeing you today, but I am sorry you are not feeling well. We are going to treat you for an exacerbation of your chronic lung disease today.  Please take the doxycycline twice a day for 10 days, take with food and water. Use the prednisone as directed for 6 days. Use the albuterol as needed for wheezing or shortness of breath.  Please let us know if you are not feeling better in the next few days-if you are getting worse please seek care right away

## 2018-11-17 NOTE — Progress Notes (Signed)
[facility] [place], [place], [place] [address] [phone]  Date:  11/17/2018   Name:  Dominique [name]   DOB:  July 12, 1941   MRN:  [id number]  PCP:  Colon Branch, MD    Chief Complaint: URI (cough, shortness of breath, wheezing, rhinorrhea, post nasal drip, "all in my throat", no fever)   History of Present Illness:  Dominique [name] is a 78 y.o. very pleasant female patient who presents with the following:  Pt of Dr. Larose Kells here today with illness History of COPD  She notes a runny nose and phlegm into her throat Her sx are getting worse   She is having a heart cath soon and they plan an aortic valve replacement for her stenosis in a month or so  Breathing seems to be overall ok She has not noted a fever No aches Her fatigue is worse than normal   They plan to do a cath on 1/30 ad will do a TAVR later on  No vomiting or diarrhea noted  She has coughed up and choked on her phlegm  She has albuterol at home but has not noted it  Occasional wheezing noted   She does have a contact with pulmonary- Dr. Melvyn Novas- but no recnet visit with him, she was told to just see him PRN   She does not have DM No allergies to medications   Patient Active Problem List   Diagnosis Date Noted  . CAD (coronary artery disease), native coronary artery   . Multiple lung nodules 06/15/2016  . Pulmonary HTN ([place]) 06/05/2016  . Aortic stenosis   . PCP NOTES >>>>>>> 10/15/2015  . Vitamin D deficiency 10/14/2015  . Sacral fracture ([place])   . Sciatica 06/08/2015  . Hip pain, bilateral 06/07/2015  . Dyspnea on exertion 04/12/2015  . History of colonic polyps 03/30/2015  . Leg edema, right 03/30/2015  . Hypocalcemia 12/27/2014  . Annual physical exam 09/27/2014  . Essential hypertension 03/19/2014  . Hyperlipidemia 03/19/2014  . COPD GOLD II 03/19/2014  . Family history of breast cancer in mother 03/19/2014    Past Medical  History:  Diagnosis Date  . Aortic stenosis, moderate    echo 04/2016  . Coronary artery calcification seen on CAT scan    no ischemia no nuclear stress test 2017  . Emphysema lung ([place])   . History of blood transfusion 1971   with C-section complication  . Hyperlipidemia    LDL goal < 70  . Hypertension   . Pulmonary HTN ([place]) 06/05/2016   Moderate with PASP 79mmHg by echo 04/2016 likely Group 3 from COPD and possibly Group 2 from pulmonary venous HTN associated with moderate AS  . Sacral fracture ([place])   . UTI (lower urinary tract infection)     Past Surgical History:  Procedure Laterality Date  . APPENDECTOMY  1951  . CATARACT EXTRACTION    . CESAREAN SECTION     1971  . IR RADIOLOGY PERIPHERAL GUIDED IV START  05/30/2017  . IR RADIOLOGY PERIPHERAL GUIDED IV START  09/02/2018  . IR RADIOLOGY PERIPHERAL GUIDED IV START  09/02/2018  . IR US GUIDE VASC ACCESS LEFT  09/02/2018  . IR US GUIDE VASC ACCESS RIGHT  05/30/2017  . IR US GUIDE VASC ACCESS RIGHT  09/02/2018  . JOINT REPLACEMENT  2016   sacroplasty  . LAMINECTOMY    . TOTAL HIP ARTHROPLASTY Bilateral   .  TUBAL LIGATION  1975    Social History   Tobacco Use  . Smoking status: Former Smoker    Packs/day: 1.00    Years: 40.00    Pack years: 40.00    Types: Cigarettes    Last attempt to quit: 03/19/2004    Years since quitting: 14.6  . Smokeless tobacco: Never Used  Substance Use Topics  . Alcohol use: Yes    Alcohol/week: 3.0 - 4.0 standard drinks    Types: 3 - 4 Glasses of wine per week    Comment: socially  . Drug use: No    Family History  Problem Relation Age of Onset  . Breast cancer Mother        breast  . Diabetes Son        type 1  . Cancer Brother        lung  . Colon cancer Neg Hx   . CAD Neg Hx   . Colon polyps Neg Hx   . Rectal cancer Neg Hx   . Stomach cancer Neg Hx     No Known Allergies  Medication list has been reviewed and updated.  Current Outpatient Medications on File Prior to  Visit  Medication Sig Dispense Refill  . amLODipine (NORVASC) 10 MG tablet Take 1 tablet (10 mg total) by mouth daily. 90 tablet 3  . aspirin EC 81 MG tablet Take 81 mg by mouth daily.    . benazepril (LOTENSIN) 20 MG tablet Take 1 tablet (20 mg total) by mouth daily. 90 tablet 2  . Calcium Carb-Cholecalciferol (CALCIUM 600 + D PO) Take 600 mg by mouth daily.    . Cholecalciferol (VITAMIN D3) 5000 UNITS CAPS Take 5,000 Units by mouth daily.     Marland Kitchen denosumab (PROLIA) 60 MG/ML SOLN injection Inject 60 mg into the skin every 6 (six) months. Administer in upper arm, thigh, or abdomen    . EPINEPHrine 0.3 mg/0.3 mL IJ SOAJ injection USE 1 PEN AS DIRECTED  12  . Multiple Vitamin (MULTIVITAMIN WITH MINERALS) TABS tablet Take 1 tablet by mouth daily.    . nitroGLYCERIN (NITROSTAT) 0.4 MG SL tablet Place 1 tablet (0.4 mg total) under the tongue every 5 (five) minutes as needed for chest pain. 25 tablet 3  . rosuvastatin (CRESTOR) 40 MG tablet TAKE 1 TABLET BY MOUTH EVERY DAY 90 tablet 3   No current facility-administered medications on file prior to visit.     Review of Systems:  As per HPI- otherwise negative.   Physical Examination: Vitals:   11/17/18 1040  BP: 118/64  Pulse: 93  Resp: 20  Temp: 98.4 F (36.9 C)  SpO2: 93%   Vitals:   11/17/18 1040  Weight: 155 lb (70.3 kg)  Height: 5\' 6"  (1.676 m)   Body mass index is 25.02 kg/m. Ideal Body Weight: Weight in (lb) to have BMI = 25: 154.6  GEN: WDWN, NAD, Non-toxic, A & O x 3, normal weight, looks well HEENT: Atraumatic, Normocephalic. Neck supple. No masses, No LAD.  Bilateral TM wnl, oropharynx normal.  PEERL,EOMI.   Ears and Nose: No external deformity. CV: RRR, No M/G/R. No JVD. No thrill. No extra heart sounds. PULM: CTA B, mild wheezing, no crackles, no rhonchi. No retractions. No resp. distress. No accessory muscle use. ABD: S, NT, ND EXTR: No c/c/e NEURO Normal gait.  PSYCH: Normally interactive. Conversant. Not  depressed or anxious appearing.  Calm demeanor.    Assessment and Plan: COPD exacerbation ([place]) - Plan:  albuterol (PROVENTIL HFA;VENTOLIN HFA) 108 (90 Base) MCG/ACT inhaler, doxycycline (VIBRAMYCIN) 100 MG capsule, predniSONE (DELTASONE) 20 MG tablet  Here today with a COPD exacerbation.  Will treat with doxycycline, prn albuterol and a short course of prednisone.  I have asked her let me know if not feeling better soon, if getting worse seek care right away.    Signed Lamar Blinks, MD

## 2018-11-25 ENCOUNTER — Telehealth: Payer: Self-pay | Admitting: *Deleted

## 2018-11-25 NOTE — Telephone Encounter (Signed)
Pt contacted pre-catheterization scheduled at [facility] for: Thursday November 27, 2018 7:30 AM Verified arrival time and place: [place] Entrance A at: 5:30 AM  No solid food after midnight prior to cath, clear liquids until 5 AM day of procedure. Contrast allergy: no Verified no diabetes medications.  AM meds can be  taken pre-cath with sip of water including: ASA 81 mg  Confirmed patient has responsible person to drive home post procedure and for 24 hours after you arrive home: yes  Pt mentioned she recently had respiratory illness, had been prescribed prednisone (finished) and antibiotic (one more day). Pt denies fever, states she generally feels well, does have some phlegm in her throat, but is improved. Pt advised to stay well hydrated, monitor symptoms, call if temperature or symptoms do not continue to improve.

## 2018-11-27 ENCOUNTER — Ambulatory Visit (HOSPITAL_COMMUNITY)
Admission: RE | Admit: 2018-11-27 | Discharge: 2018-11-27 | Disposition: A | Discharge status: Home / Self Care | Payer: Medicare Other | Attending: Cardiovascular Disease | Admitting: Cardiovascular Disease

## 2018-11-27 ENCOUNTER — Other Ambulatory Visit: Payer: Self-pay

## 2018-11-27 ENCOUNTER — Encounter (HOSPITAL_COMMUNITY)
Admission: RE | Disposition: A | Discharge status: Home / Self Care | Payer: Self-pay | Source: Home / Self Care | Attending: Cardiovascular Disease

## 2018-11-27 ENCOUNTER — Encounter (HOSPITAL_COMMUNITY): Payer: Self-pay | Admitting: Cardiovascular Disease

## 2018-11-27 DIAGNOSIS — I060 Rheumatic aortic stenosis: Secondary | ICD-10-CM | POA: Diagnosis not present

## 2018-11-27 DIAGNOSIS — I272 Pulmonary hypertension, unspecified: Secondary | ICD-10-CM | POA: Insufficient documentation

## 2018-11-27 DIAGNOSIS — Z87891 Personal history of nicotine dependence: Secondary | ICD-10-CM | POA: Diagnosis not present

## 2018-11-27 DIAGNOSIS — R600 Localized edema: Secondary | ICD-10-CM | POA: Diagnosis not present

## 2018-11-27 DIAGNOSIS — Z9851 Tubal ligation status: Secondary | ICD-10-CM | POA: Insufficient documentation

## 2018-11-27 DIAGNOSIS — I10 Essential (primary) hypertension: Secondary | ICD-10-CM | POA: Diagnosis not present

## 2018-11-27 DIAGNOSIS — E785 Hyperlipidemia, unspecified: Secondary | ICD-10-CM | POA: Insufficient documentation

## 2018-11-27 DIAGNOSIS — R0602 Shortness of breath: Secondary | ICD-10-CM | POA: Diagnosis not present

## 2018-11-27 DIAGNOSIS — Z8249 Family history of ischemic heart disease and other diseases of the circulatory system: Secondary | ICD-10-CM | POA: Insufficient documentation

## 2018-11-27 DIAGNOSIS — Z7982 Long term (current) use of aspirin: Secondary | ICD-10-CM | POA: Insufficient documentation

## 2018-11-27 DIAGNOSIS — J449 Chronic obstructive pulmonary disease, unspecified: Secondary | ICD-10-CM | POA: Insufficient documentation

## 2018-11-27 DIAGNOSIS — Z79899 Other long term (current) drug therapy: Secondary | ICD-10-CM | POA: Insufficient documentation

## 2018-11-27 DIAGNOSIS — I350 Nonrheumatic aortic (valve) stenosis: Secondary | ICD-10-CM | POA: Diagnosis not present

## 2018-11-27 HISTORY — PX: RIGHT/LEFT HEART CATH AND CORONARY ANGIOGRAPHY: CATH118266

## 2018-11-27 LAB — POCT I-STAT EG7
Bicarbonate: 25.5 mmol/L (ref 20.0–28.0)
CALCIUM ION: 1.15 mmol/L (ref 1.15–1.40)
HCT: 36 % (ref 36.0–46.0)
Hemoglobin: 12.2 g/dL (ref 12.0–15.0)
O2 Saturation: 77 %
Potassium: 3.7 mmol/L (ref 3.5–5.1)
Sodium: 138 mmol/L (ref 135–145)
TCO2: 27 mmol/L (ref 22–32)
pCO2, Ven: 44.3 mmHg (ref 44.0–60.0)
pH, Ven: 7.367 (ref 7.250–7.430)
pO2, Ven: 43 mmHg (ref 32.0–45.0)

## 2018-11-27 LAB — POCT I-STAT 7, (LYTES, BLD GAS, ICA,H+H)
Bicarbonate: 25.5 mmol/L (ref 20.0–28.0)
Calcium, Ion: 1.2 mmol/L (ref 1.15–1.40)
HCT: 37 % (ref 36.0–46.0)
Hemoglobin: 12.6 g/dL (ref 12.0–15.0)
O2 Saturation: 98 %
POTASSIUM: 3.8 mmol/L (ref 3.5–5.1)
SODIUM: 137 mmol/L (ref 135–145)
TCO2: 27 mmol/L (ref 22–32)
pCO2 arterial: 43.8 mmHg (ref 32.0–48.0)
pH, Arterial: 7.373 (ref 7.350–7.450)
pO2, Arterial: 102 mmHg (ref 83.0–108.0)

## 2018-11-27 SURGERY — RIGHT/LEFT HEART CATH AND CORONARY ANGIOGRAPHY
Anesthesia: LOCAL

## 2018-11-27 MED ORDER — LIDOCAINE HCL (PF) 1 % IJ SOLN
INTRAMUSCULAR | Status: AC
  Filled 2018-11-27: qty 30

## 2018-11-27 MED ORDER — MIDAZOLAM HCL 2 MG/2ML IJ SOLN
INTRAMUSCULAR | Status: AC
  Filled 2018-11-27: qty 2

## 2018-11-27 MED ORDER — SODIUM CHLORIDE 0.9% FLUSH: 3.0000 mL | Freq: Two times a day (BID) | INTRAVENOUS | Status: DC

## 2018-11-27 MED ORDER — ASPIRIN 81 MG PO CHEW: 81.0000 mg | CHEWABLE_TABLET | ORAL | Status: DC

## 2018-11-27 MED ORDER — SODIUM CHLORIDE 0.9 % IV SOLN: 250.0000 mL | INTRAVENOUS | Status: DC | PRN

## 2018-11-27 MED ORDER — FENTANYL CITRATE (PF) 100 MCG/2ML IJ SOLN
INTRAMUSCULAR | Status: DC | PRN
  Administered 2018-11-27: 25 ug via INTRAVENOUS

## 2018-11-27 MED ORDER — FENTANYL CITRATE (PF) 100 MCG/2ML IJ SOLN
INTRAMUSCULAR | Status: AC
  Filled 2018-11-27: qty 2

## 2018-11-27 MED ORDER — VERAPAMIL HCL 2.5 MG/ML IV SOLN
INTRAVENOUS | Status: AC
  Filled 2018-11-27: qty 2

## 2018-11-27 MED ORDER — HEPARIN (PORCINE) IN NACL 1000-0.9 UT/500ML-% IV SOLN
INTRAVENOUS | Status: AC
  Filled 2018-11-27: qty 500

## 2018-11-27 MED ORDER — MIDAZOLAM HCL 2 MG/2ML IJ SOLN
INTRAMUSCULAR | Status: DC | PRN
  Administered 2018-11-27: 1 mg via INTRAVENOUS

## 2018-11-27 MED ORDER — HEPARIN SODIUM (PORCINE) 1000 UNIT/ML IJ SOLN
INTRAMUSCULAR | Status: AC
  Filled 2018-11-27: qty 1

## 2018-11-27 MED ORDER — ONDANSETRON HCL 4 MG/2ML IJ SOLN: 4.0000 mg | Freq: Four times a day (QID) | INTRAMUSCULAR | Status: DC | PRN

## 2018-11-27 MED ORDER — IOHEXOL 350 MG/ML SOLN
INTRAVENOUS | Status: DC | PRN
  Administered 2018-11-27: 30 mL via INTRAVENOUS

## 2018-11-27 MED ORDER — SODIUM CHLORIDE 0.9 % WEIGHT BASED INFUSION: 1.0000 mL/kg/h | INTRAVENOUS | Status: DC

## 2018-11-27 MED ORDER — SODIUM CHLORIDE 0.9% FLUSH: 3.0000 mL | INTRAVENOUS | Status: DC | PRN

## 2018-11-27 MED ORDER — SODIUM CHLORIDE 0.9 % WEIGHT BASED INFUSION
1.0000 mL/kg/h | INTRAVENOUS | Status: DC
  Administered 2018-11-27: 1 mL/kg/h via INTRAVENOUS

## 2018-11-27 MED ORDER — SODIUM CHLORIDE 0.9 % WEIGHT BASED INFUSION
3.0000 mL/kg/h | INTRAVENOUS | Status: AC
  Administered 2018-11-27: 3 mL/kg/h via INTRAVENOUS

## 2018-11-27 MED ORDER — LIDOCAINE HCL (PF) 1 % IJ SOLN
INTRAMUSCULAR | Status: DC | PRN
  Administered 2018-11-27: 2 mL
  Administered 2018-11-27: 1 mL

## 2018-11-27 MED ORDER — HEPARIN (PORCINE) IN NACL 1000-0.9 UT/500ML-% IV SOLN
INTRAVENOUS | Status: DC | PRN
  Administered 2018-11-27 (×2): 500 mL

## 2018-11-27 MED ORDER — ACETAMINOPHEN 325 MG PO TABS: 650.0000 mg | ORAL_TABLET | ORAL | Status: DC | PRN

## 2018-11-27 MED ORDER — HEPARIN SODIUM (PORCINE) 1000 UNIT/ML IJ SOLN
INTRAMUSCULAR | Status: DC | PRN
  Administered 2018-11-27: 3500 [IU] via INTRAVENOUS

## 2018-11-27 MED ORDER — VERAPAMIL HCL 2.5 MG/ML IV SOLN
INTRAVENOUS | Status: DC | PRN
  Administered 2018-11-27: 10 mL via INTRA_ARTERIAL

## 2018-11-27 SURGICAL SUPPLY — 12 items
CATH 5FR JL3.5 JR4 ANG PIG MP (CATHETERS) ×2 IMPLANT
CATH BALLN WEDGE 5F 110CM (CATHETERS) ×2 IMPLANT
DEVICE RAD COMP TR BAND LRG (VASCULAR PRODUCTS) ×2 IMPLANT
GLIDESHEATH SLEND SS 6F .021 (SHEATH) ×2 IMPLANT
GUIDEWIRE INQWIRE 1.5J.035X260 (WIRE) ×1 IMPLANT
INQWIRE 1.5J .035X260CM (WIRE) ×2
KIT HEART LEFT (KITS) ×2 IMPLANT
PACK CARDIAC CATHETERIZATION (CUSTOM PROCEDURE TRAY) ×2 IMPLANT
SHEATH GLIDE SLENDER 4/5FR (SHEATH) ×2 IMPLANT
TRANSDUCER W/STOPCOCK (MISCELLANEOUS) ×2 IMPLANT
TUBING CIL FLEX 10 FLL-RA (TUBING) ×2 IMPLANT
WIRE HI TORQ VERSACORE-J 145CM (WIRE) ×2 IMPLANT

## 2018-11-27 NOTE — Discharge Instructions (Signed)
Radial Site Care ° °This sheet gives you information about how to care for yourself after your procedure. Your health care provider may also give you more specific instructions. If you have problems or questions, contact your health care provider. °What can I expect after the procedure? °After the procedure, it is common to have: °· Bruising and tenderness at the catheter insertion area. °Follow these instructions at home: °Medicines °· Take over-the-counter and prescription medicines only as told by your health care provider. °Insertion site care °· Follow instructions from your health care provider about how to take care of your insertion site. Make sure you: °? Wash your hands with soap and water before you change your bandage (dressing). If soap and water are not available, use hand sanitizer. °? Change your dressing as told by your health care provider. °? Leave stitches (sutures), skin glue, or adhesive strips in place. These skin closures may need to stay in place for 2 weeks or longer. If adhesive strip edges start to loosen and curl up, you may trim the loose edges. Do not remove adhesive strips completely unless your health care provider tells you to do that. °· Check your insertion site every day for signs of infection. Check for: °? Redness, swelling, or pain. °? Fluid or blood. °? Pus or a bad smell. °? Warmth. °· Do not take baths, swim, or use a hot tub until your health care provider approves. °· You may shower 24-48 hours after the procedure, or as directed by your health care provider. °? Remove the dressing and gently wash the site with plain soap and water. °? Pat the area dry with a clean towel. °? Do not rub the site. That could cause bleeding. °· Do not apply powder or lotion to the site. °Activity ° °· For 24 hours after the procedure, or as directed by your health care provider: °? Do not flex or bend the affected arm. °? Do not push or pull heavy objects with the affected arm. °? Do not  drive yourself home from the hospital or clinic. You may drive 24 hours after the procedure unless your health care provider tells you not to. °? Do not operate machinery or power tools. °· Do not lift anything that is heavier than 10 lb (4.5 kg), or the limit that you are told, until your health care provider says that it is safe. °· Ask your health care provider when it is okay to: °? Return to work or school. °? Resume usual physical activities or sports. °? Resume sexual activity. °General instructions °· If the catheter site starts to bleed, raise your arm and put firm pressure on the site. If the bleeding does not stop, get help right away. This is a medical emergency. °· If you went home on the same day as your procedure, a responsible adult should be with you for the first 24 hours after you arrive home. °· Keep all follow-up visits as told by your health care provider. This is important. °Contact a health care provider if: °· You have a fever. °· You have redness, swelling, or yellow drainage around your insertion site. °Get help right away if: °· You have unusual pain at the radial site. °· The catheter insertion area swells very fast. °· The insertion area is bleeding, and the bleeding does not stop when you hold steady pressure on the area. °· Your arm or hand becomes pale, cool, tingly, or numb. °These symptoms may represent a serious problem   that is an emergency. Do not wait to see if the symptoms will go away. Get medical help right away. Call your local emergency services (911 in the [place] Do not drive yourself to the hospital. °Summary °· After the procedure, it is common to have bruising and tenderness at the site. °· Follow instructions from your health care provider about how to take care of your radial site wound. Check the wound every day for signs of infection. °· Do not lift anything that is heavier than 10 lb (4.5 kg), or the limit that you are told, until your health care provider says  that it is safe. °This information is not intended to replace advice given to you by your health care provider. Make sure you discuss any questions you have with your health care provider. °Document Released: 11/17/2010 Document Revised: 11/20/2017 Document Reviewed: 11/20/2017 °Elsevier Interactive Patient Education © 2019 [employer] Inc[employer] ° °

## 2018-11-27 NOTE — Progress Notes (Signed)
Unable to obtain IV Order placed for IV team consult

## 2018-11-27 NOTE — Interval H&P Note (Signed)
History and Physical Interval Note:  11/27/2018 6:39 AM  Dominique [name]  has presented today for surgery, with the diagnosis of aortic stenosis  The various methods of treatment have been discussed with the patient and family. After consideration of risks, benefits and other options for treatment, the patient has consented to  Procedure(s): RIGHT/LEFT HEART CATH AND CORONARY ANGIOGRAPHY (N/A) as a surgical intervention .  The patient's history has been reviewed, patient examined, no change in status, stable for surgery.  I have reviewed the patient's chart and labs.  Questions were answered to the patient's satisfaction.     Sherren Mocha

## 2018-11-28 ENCOUNTER — Other Ambulatory Visit: Payer: Self-pay

## 2018-11-28 MED ORDER — METOPROLOL TARTRATE 50 MG PO TABS: ORAL_TABLET | ORAL | 0 refills | Status: DC

## 2018-12-07 ENCOUNTER — Other Ambulatory Visit: Payer: Self-pay | Admitting: Family Medicine

## 2018-12-07 DIAGNOSIS — J441 Chronic obstructive pulmonary disease with (acute) exacerbation: Secondary | ICD-10-CM

## 2018-12-09 ENCOUNTER — Ambulatory Visit (HOSPITAL_COMMUNITY)
Admission: RE | Admit: 2018-12-09 | Discharge: 2018-12-09 | Disposition: A | Discharge status: Home / Self Care | Payer: Medicare Other | Source: Ambulatory Visit | Attending: Cardiovascular Disease | Admitting: Cardiovascular Disease

## 2018-12-09 ENCOUNTER — Ambulatory Visit (HOSPITAL_COMMUNITY): Admission: RE | Admit: 2018-12-09 | Payer: Medicare Other | Source: Ambulatory Visit

## 2018-12-09 DIAGNOSIS — I350 Nonrheumatic aortic (valve) stenosis: Secondary | ICD-10-CM

## 2018-12-09 DIAGNOSIS — R918 Other nonspecific abnormal finding of lung field: Secondary | ICD-10-CM | POA: Diagnosis not present

## 2018-12-09 DIAGNOSIS — K5730 Diverticulosis of large intestine without perforation or abscess without bleeding: Secondary | ICD-10-CM | POA: Diagnosis not present

## 2018-12-09 DIAGNOSIS — I77810 Thoracic aortic ectasia: Secondary | ICD-10-CM | POA: Diagnosis not present

## 2018-12-09 DIAGNOSIS — Z952 Presence of prosthetic heart valve: Secondary | ICD-10-CM | POA: Diagnosis not present

## 2018-12-09 LAB — PULMONARY FUNCTION TEST
DL/VA % PRED: 63 %
DL/VA: 2.59 ml/min/mmHg/L
DLCO unc % pred: 45 %
DLCO unc: 8.82 ml/min/mmHg
FEF 25-75 Post: 0.62 L/sec
FEF 25-75 Pre: 0.4 L/sec
FEF2575-%Change-Post: 55 %
FEF2575-%Pred-Post: 39 %
FEF2575-%Pred-Pre: 25 %
FEV1-%Change-Post: 1 %
FEV1-%Pred-Post: 51 %
FEV1-%Pred-Pre: 50 %
FEV1-Post: 1.07 L
FEV1-Pre: 1.06 L
FEV1FVC-%Change-Post: -17 %
FEV1FVC-%PRED-PRE: 81 %
FEV6-%Change-Post: 18 %
FEV6-%Pred-Post: 74 %
FEV6-%Pred-Pre: 62 %
FEV6-POST: 1.99 L
FEV6-Pre: 1.68 L
FEV6FVC-%Change-Post: -3 %
FEV6FVC-%PRED-POST: 96 %
FEV6FVC-%Pred-Pre: 100 %
FVC-%Change-Post: 22 %
FVC-%Pred-Post: 77 %
FVC-%Pred-Pre: 62 %
FVC-Post: 2.17 L
FVC-Pre: 1.77 L
Post FEV1/FVC ratio: 49 %
Post FEV6/FVC ratio: 91 %
Pre FEV1/FVC ratio: 60 %
Pre FEV6/FVC Ratio: 95 %
RV % pred: 145 %
RV: 3.49 L
TLC % pred: 104 %
TLC: 5.43 L

## 2018-12-09 MED ORDER — IOPAMIDOL (ISOVUE-370) INJECTION 76%
100.0000 mL | Freq: Once | INTRAVENOUS | Status: AC | PRN
  Administered 2018-12-09: 100 mL via INTRAVENOUS

## 2018-12-09 MED ORDER — ALBUTEROL SULFATE (2.5 MG/3ML) 0.083% IN NEBU
2.5000 mg | INHALATION_SOLUTION | Freq: Once | RESPIRATORY_TRACT | Status: AC
  Administered 2018-12-09: 2.5 mg via RESPIRATORY_TRACT

## 2018-12-10 ENCOUNTER — Institutional Professional Consult (permissible substitution) (INDEPENDENT_AMBULATORY_CARE_PROVIDER_SITE_OTHER): Payer: Medicare Other | Admitting: Surgery

## 2018-12-10 VITALS — BP 98/54 | HR 74 | Resp 20 | Ht 66.0 in | Wt 153.0 lb

## 2018-12-10 DIAGNOSIS — I350 Nonrheumatic aortic (valve) stenosis: Secondary | ICD-10-CM

## 2018-12-11 ENCOUNTER — Encounter: Payer: Self-pay | Admitting: Surgery

## 2018-12-11 ENCOUNTER — Encounter: Payer: Self-pay | Admitting: Physical Therapy

## 2018-12-11 ENCOUNTER — Ambulatory Visit: Payer: Medicare Other | Attending: Cardiovascular Disease | Admitting: Physical Therapy

## 2018-12-11 ENCOUNTER — Encounter: Payer: Medicare Other | Admitting: Surgery

## 2018-12-11 ENCOUNTER — Other Ambulatory Visit: Payer: Self-pay

## 2018-12-11 DIAGNOSIS — R262 Difficulty in walking, not elsewhere classified: Secondary | ICD-10-CM

## 2018-12-11 NOTE — Therapy (Signed)
[place], [place], [address] Phone: [phone]   Fax:  [phone]  Physical Therapy Evaluation/TAVR  Patient Details  Name: Dominique [name] MRN: [id number] Date of Birth: 07/04/1941 Referring Provider (PT): Sherren Mocha, MD   Encounter Date: 12/11/2018  PT End of Session - 12/11/18 1415    Visit Number  1    PT Start Time  1332    PT Stop Time  1412    PT Time Calculation (min)  40 min    Activity Tolerance  Patient tolerated treatment well    Behavior During Therapy  [facility] for tasks assessed/performed       Past Medical History:  Diagnosis Date  . Aortic stenosis, moderate    echo 04/2016  . Coronary artery calcification seen on CAT scan    no ischemia no nuclear stress test 2017  . Emphysema lung ([place])   . History of blood transfusion 1971   with C-section complication  . Hyperlipidemia    LDL goal < 70  . Hypertension   . Pulmonary HTN ([place]) 06/05/2016   Moderate with PASP 59mmHg by echo 04/2016 likely Group 3 from COPD and possibly Group 2 from pulmonary venous HTN associated with moderate AS  . Sacral fracture ([place])   . UTI (lower urinary tract infection)     Past Surgical History:  Procedure Laterality Date  . APPENDECTOMY  1951  . CATARACT EXTRACTION    . CESAREAN SECTION     1971  . IR RADIOLOGY PERIPHERAL GUIDED IV START  05/30/2017  . IR RADIOLOGY PERIPHERAL GUIDED IV START  09/02/2018  . IR RADIOLOGY PERIPHERAL GUIDED IV START  09/02/2018  . IR US GUIDE VASC ACCESS LEFT  09/02/2018  . IR US GUIDE VASC ACCESS RIGHT  05/30/2017  . IR US GUIDE VASC ACCESS RIGHT  09/02/2018  . JOINT REPLACEMENT  2016   sacroplasty  . LAMINECTOMY    . RIGHT/LEFT HEART CATH AND CORONARY ANGIOGRAPHY N/A 11/27/2018   Procedure: RIGHT/LEFT HEART CATH AND CORONARY ANGIOGRAPHY;  Surgeon: Sherren Mocha, MD;  Location: [facility];  Service: Cardiovascular;  Laterality: N/A;  . TOTAL HIP ARTHROPLASTY  Bilateral   . TUBAL LIGATION  1975    There were no vitals filed for this visit.   Subjective Assessment - 12/11/18 1341    Subjective  If I move around in the house i get SOB, has been going on a few years with recent worsening. Has to sit to get dressed in the AM. Bil THA.     Currently in Pain?  No/denies         [facility] PT Assessment - 12/11/18 0001      Assessment   Medical Diagnosis  severe aortic stenosis    Referring Provider (PT)  Sherren Mocha, MD    Hand Dominance  Right      Precautions   Precautions  None      Restrictions   Weight Bearing Restrictions  No      Balance Screen   Has the patient fallen in the past 6 months  No      [place] residence    Living Arrangements  Alone    Additional Comments  no stairs      Prior Function   Level of Independence  Independent    Leisure  crafting, going out for lunch, socializing      Cognition   Overall Cognitive  Status  Within Functional Limits for tasks assessed      Sensation   Additional Comments  bil hand numbness at night that resolves quickly      Posture/Postural Control   Posture Comments  upper trunk shift to Rt, flat thoracic spine, bil rounded shoulders      ROM / Strength   AROM / PROM / Strength  AROM;Strength      AROM   Overall AROM Comments  WFL      Strength   Overall Strength Comments  gross strength 5/5; Rt grip 45lb, Lt 40lb       [facility] Pre-Surgical Assessment - 12/11/18 0001    5 Meter Walk Test- trial 1  3 sec    5 Meter Walk Test- trial 2  3 sec.     5 Meter Walk Test- trial 3  3 sec.    5 meter walk test average  3 sec    4 Stage Balance Test Position  1    Sit To Stand Test- trial 1  22 sec.    6 Minute Walk- Baseline  yes    BP (mmHg)  113/58    HR (bpm)  80    02 Sat (%RA)  99 %    Modified Borg Scale for Dyspnea  0- Nothing at all    Perceived Rate of Exertion (Borg)  6-    6 Minute Walk Post Test  yes    BP (mmHg)  108/55     HR (bpm)  80    02 Sat (%RA)  85 %   up to 97% after BP taken   Modified Borg Scale for Dyspnea  1- Very mild shortness of breath    Perceived Rate of Exertion (Borg)  8-    Aerobic Endurance Distance Walked  996    Endurance additional comments  no rest breaks requested; 35% disability              Objective measurements completed on examination: See above findings.                           Plan - 12/11/18 1414    PT Frequency  --   one time pre TAVR eval   Consulted and Agree with Plan of Care  Patient;Family member/caregiver    Family Member Consulted  Daughter in law       Clinical Impression Statement: Pt is a 78 yo F presenting to OP PT for evaluation prior to possible TAVR surgery due to severe aortic stenosis. Pt reports onset of SOB a few years ago that has worsened recently. Symptoms are  limiting ambulation endurance at home and in the community. Pt presents with WFL ROM and strength and denies musculoskeletal pain.  Pt ambulated a total of 996 feet in 6 minute walk and reported 1/10 SOB on modified scale for dyspena and 8/20 RPE on Borg's perceived exertion and pain scale at the end of the walk. During the 6 minute walk test, patient's HR increased to 103 BPM and O2 saturation decreased to 85%. Based on the Short Physical Performance Battery, patient has a frailty rating of 6/12 with </= 5/12 considered frail.  Visit Diagnosis: Difficulty in walking, not elsewhere classified     Problem List Patient Active Problem List   Diagnosis Date Noted  . CAD (coronary artery disease), native coronary artery   . Multiple lung nodules 06/15/2016  . Pulmonary HTN ([place]) 06/05/2016  .  Aortic stenosis   . PCP NOTES >>>>>>> 10/15/2015  . Vitamin D deficiency 10/14/2015  . Sacral fracture ([place])   . Sciatica 06/08/2015  . Hip pain, bilateral 06/07/2015  . Dyspnea on exertion 04/12/2015  . History of colonic polyps 03/30/2015  . Leg edema, right  03/30/2015  . Hypocalcemia 12/27/2014  . Annual physical exam 09/27/2014  . Essential hypertension 03/19/2014  . Hyperlipidemia 03/19/2014  . COPD GOLD II 03/19/2014  . Family history of breast cancer in mother 03/19/2014    [name]. Raysha Tilmon PT, DPT 12/11/18 5:38 PM   [facility] [address] [place], [place], [address] Phone: [phone]   Fax:  [phone]  Name: RUTHA MELGOZA MRN: [id number] Date of Birth: [birth year]

## 2018-12-11 NOTE — Progress Notes (Signed)
HE[facility] AND [facility]GERY CONSULTATION REPORT  Referring Provider is [name], MD PCP is Colon Branch, MD  Chief Complaint  Patient presents with  . Aortic Stenosis    Surgical eval for TAVR, review all studies     HPI:  The patient is a 78 year old woman with history of hypertension, hyperlipidemia, and aortic stenosis that has been followed by Dr. [name] with serial echocardiograms.  She said that she was told as a child in [place] that she had some abnormality of her aortic valve.  An echocardiogram in 05/2017 showed what appeared to be a trileaflet aortic valve with a mean gradient of 21 mmHg consistent with moderate stenosis.  She had a follow-up echocardiogram in 05/2018 which showed an increase in the mean gradient to 36 mmHg with normal left ventricular function but she remained asymptomatic.  She now presents with a several month history of progressive exertional fatigue and shortness of breath.  Over the past couple weeks it seems to have worsened and she has having shortness of breath with mild to moderate activity.  Sometimes even taking a shower wears her out.  She has noted some exertional chest pressure.  She has had no dizziness or syncope.  She denies orthopnea and PND.  She has had some swelling in her ankles and feet intermittently.  A repeat echocardiogram on 10/31/2018 shows a trileaflet aortic valve with moderate thickening and calcification of the leaflets with restricted mobility.  The mean gradient increased further to 39 mmHg with a peak gradient of 64 mmHg.  There is no regurgitation.  Aortic valve area was measured at 0.88 cm.  Left ventricular ejection fraction was 60 to 65% with grade 1 diastolic dysfunction.  She underwent cardiac catheterization on 11/27/2018 showing normal coronary arteries.  The mean gradient across aortic valve was measured at 17 mmHg with with a calculated valve area of 1.47 cm.   Right heart pressures were normal.  The patient is here today with her son and daughter-in-law.  She is widowed and lives alone.  She is a remote smoker but quit about 12 years ago.  She has a 40-pack-year smoking history and has been told that she has emphysema.  Past Medical History:  Diagnosis Date  . Aortic stenosis, moderate    echo 04/2016  . Coronary artery calcification seen on CAT scan    no ischemia no nuclear stress test 2017  . Emphysema lung ([facility])   . History of blood transfusion 1971   with C-section complication  . Hyperlipidemia    LDL goal < 70  . Hypertension   . Pulmonary HTN ([place]) 06/05/2016   Moderate with PASP 53mmHg by echo 04/2016 likely Group 3 from COPD and possibly Group 2 from pulmonary venous HTN associated with moderate AS  . Sacral fracture ([place])   . UTI (lower urinary tract infection)     Past Surgical History:  Procedure Laterality Date  . APPENDECTOMY  1951  . CATARACT EXTRACTION    . CESAREAN SECTION     1971  . IR RADIOLOGY PERIPHERAL GUIDED IV START  05/30/2017  . IR RADIOLOGY PERIPHERAL GUIDED IV START  09/02/2018  . IR RADIOLOGY PERIPHERAL GUIDED IV START  09/02/2018  . IR US GUIDE VASC ACCESS LEFT  09/02/2018  . IR US GUIDE VASC ACCESS RIGHT  05/30/2017  . IR US GUIDE VASC ACCESS RIGHT  09/02/2018  . JOINT REPLACEMENT  2016   sacroplasty  .  LAMINECTOMY    . RIGHT/LEFT HEART CATH AND CORONARY ANGIOGRAPHY N/A 11/27/2018   Procedure: RIGHT/LEFT HEART CATH AND CORONARY ANGIOGRAPHY;  Surgeon: Sherren Mocha, MD;  Location: [facility];  Service: Cardiovascular;  Laterality: N/A;  . TOTAL HIP ARTHROPLASTY Bilateral   . TUBAL LIGATION  1975    Family History  Problem Relation Age of Onset  . Breast cancer Mother        breast  . Diabetes Son        type 1  . Cancer Brother        lung  . Colon cancer Neg Hx   . CAD Neg Hx   . Colon polyps Neg Hx   . Rectal cancer Neg Hx   . Stomach cancer Neg Hx     Social History    Socioeconomic History  . Marital status: Widowed    Spouse name: Not on file  . Number of children: 2  . Years of education: Not on file  . Highest education level: Not on file  Occupational History  . Occupation: n/a  Social Needs  . Financial resource strain: Not on file  . Food insecurity:    Worry: Not on file    Inability: Not on file  . Transportation needs:    Medical: Not on file    Non-medical: Not on file  Tobacco Use  . Smoking status: Former Smoker    Packs/day: 1.00    Years: 40.00    Pack years: 40.00    Types: Cigarettes    Last attempt to quit: 03/19/2004    Years since quitting: 14.7  . Smokeless tobacco: Never Used  Substance and Sexual Activity  . Alcohol use: Yes    Alcohol/week: 3.0 - 4.0 standard drinks    Types: 3 - 4 Glasses of wine per week    Comment: socially  . Drug use: No  . Sexual activity: Not on file  Lifestyle  . Physical activity:    Days per week: Not on file    Minutes per session: Not on file  . Stress: Not on file  Relationships  . Social connections:    Talks on phone: Not on file    Gets together: Not on file    Attends religious service: Not on file    Active member of club or organization: Not on file    Attends meetings of clubs or organizations: Not on file    Relationship status: Not on file  . Intimate partner violence:    Fear of current or ex partner: Not on file    Emotionally abused: Not on file    Physically abused: Not on file    Forced sexual activity: Not on file  Other Topics Concern  . Not on file  Social History Narrative   From [place]   1 living son    Current Outpatient Medications  Medication Sig Dispense Refill  . amLODipine (NORVASC) 10 MG tablet Take 1 tablet (10 mg total) by mouth daily. 90 tablet 3  . aspirin EC 81 MG tablet Take 81 mg by mouth daily.    . benazepril (LOTENSIN) 20 MG tablet Take 1 tablet (20 mg total) by mouth daily. 90 tablet 2  . Calcium Carb-Cholecalciferol (CALCIUM  600 + D PO) Take 600 mg by mouth 3 (three) times a week.     . calcium carbonate (TUMS - DOSED IN MG ELEMENTAL CALCIUM) 500 MG chewable tablet Chew 2 tablets by mouth daily as  needed for indigestion or heartburn.    . Cholecalciferol (VITAMIN D3) 5000 UNITS CAPS Take 5,000 Units by mouth daily.     Marland Kitchen denosumab (PROLIA) 60 MG/ML SOLN injection Inject 60 mg into the skin every 6 (six) months. Administer in upper arm, thigh, or abdomen    . EPINEPHrine 0.3 mg/0.3 mL IJ SOAJ injection Inject 0.3 mg into the muscle as needed for anaphylaxis.   12  . Multiple Vitamin (MULTIVITAMIN WITH MINERALS) TABS tablet Take 1 tablet by mouth daily.    . nitroGLYCERIN (NITROSTAT) 0.4 MG SL tablet Place 1 tablet (0.4 mg total) under the tongue every 5 (five) minutes as needed for chest pain. 25 tablet 3  . rosuvastatin (CRESTOR) 40 MG tablet TAKE 1 TABLET BY MOUTH EVERY DAY 90 tablet 3  . triamcinolone (NASACORT ALLERGY 24HR) 55 MCG/ACT AERO nasal inhaler Place 1 spray into the nose daily as needed (congestion).     No current facility-administered medications for this visit.     No Known Allergies    Review of Systems:   General:  decreased appetite, decreased energy, no weight gain, + weight loss, no fever  Cardiac:  + chest pain with exertion, no chest pain at rest, +SOB with mild exertion, no resting SOB, no PND, no orthopnea, no palpitations, no arrhythmia, no atrial fibrillation, + LE edema, no dizzy spells, no syncope  Respiratory:  + exertional shortness of breath, no home oxygen, no productive cough, + dry cough, no bronchitis, + wheezing, no hemoptysis, no asthma, no pain with inspiration or cough, no sleep apnea, no CPAP at night  GI:   no difficulty swallowing, no reflux, + frequent heartburn, no hiatal hernia, no abdominal pain, no constipation, no diarrhea, no hematochezia, no hematemesis, no melena  GU:   no dysuria,  no frequency, no urinary tract infection, no hematuria, no kidney stones, no  kidney disease  Vascular:  no pain suggestive of claudication, no pain in feet, no leg cramps, no varicose veins, no DVT, no non-healing foot ulcer  Neuro:   no stroke, no TIA's, no seizures, no headaches, no temporary blindness one eye,  no slurred speech, no peripheral neuropathy, no chronic pain, no instability of gait, no memory/cognitive dysfunction  Musculoskeletal: no arthritis, no joint swelling, no myalgias, no difficulty walking, normal mobility   Skin:   no rash, no itching, no skin infections, no pressure sores or ulcerations  Psych:   no anxiety, no depression, no nervousness, no unusual recent stress  Eyes:   + blurry vision, + floaters, no recent vision changes, does not wear glasses or contacts  ENT:   no hearing loss, no loose or painful teeth, no dentures, last saw dentist 4 months ago  Hematologic:  no easy bruising, no abnormal bleeding, no clotting disorder, no frequent epistaxis  Endocrine:  no diabetes, does not check CBG's at home       Physical Exam:   BP (!) 98/54   Pulse 74   Resp 20   Ht 5\' 6"  (1.676 m)   Wt 153 lb (69.4 kg)   SpO2 93% Comment: RA  BMI 24.69 kg/m   General:  Elderly but  well-appearing  HEENT:  Unremarkable, NCAT, PERLA, EOMI, oropharynx clear  Neck:   no JVD, no bruits, no adenopathy or thyromegaly  Chest:   clear to auscultation, symmetrical breath sounds, no wheezes, no rhonchi   CV:   RRR, grade III/VI crescendo/decrescendo murmur heard best at RSB,  no diastolic murmur  Abdomen:  soft, non-tender, no masses   Extremities:  warm, well-perfused, pulses palpable in feet, no LE edema  Rectal/GU  Deferred  Neuro:   Grossly non-focal and symmetrical throughout  Skin:   Clean and dry, no rashes, no breakdown   Diagnostic Tests:          [facility]                        [address],  [address]                             [phone]  ------------------------------------------------------------------- Echocardiography  Patient:    Lauren, Modisette MR #:       [id number] Study Date: 10/31/2018 Gender:     F Age:        54 Height:     167.6 cm Weight:     70.9 kg BSA:        1.83 m^2 Pt. Status: Room:   ATTENDING    Kirk Ruths  SONOGRAPHER  Wyatt Mage, RDCS  PERFORMING   [facility]  ORDERING     [name], MD  [place], MD  cc:  ------------------------------------------------------------------- LV EF: 60% -   65%  ------------------------------------------------------------------- Indications:      Aortic stenosis (I35).  ------------------------------------------------------------------- History:   PMH:   Dyspnea.  Coronary artery disease.  Aortic valve disease.  Chronic obstructive pulmonary disease.  Risk factors: Pulmonary hypertension. Hypertension. Dyslipidemia.  ------------------------------------------------------------------- Study Conclusions  - Left ventricle: The cavity size was normal. Systolic function was   normal. The estimated ejection fraction was in the range of 60%   to 65%. Wall motion was normal; there were no regional wall   motion abnormalities. Doppler parameters are consistent with   abnormal left ventricular relaxation (grade 1 diastolic   dysfunction). Doppler parameters are consistent with high   ventricular filling pressure. - Aortic valve: Valve mobility was restricted. There was moderate   to severe stenosis. There was no regurgitation. Peak velocity   (S): 399 cm/s. Mean gradient (S): 39 mm Hg. Peak gradient (S): 64   mm Hg. - Aorta: Ascending aortic diameter: 38 mm (S). - Ascending aorta: The ascending aorta was mildly dilated. - Mitral valve: Transvalvular velocity was within the normal range.   There was no evidence for stenosis. There was mild regurgitation. - Right ventricle: The cavity size was  normal. Wall thickness was   normal. Systolic function was normal. - Pulmonary arteries: Systolic pressure was mildly increased. PA   peak pressure: 41 mm Hg (S). - Global longitudinal strain -16.3% (mildly abnormal).  Impressions:  - Compared with the echo 05/2018, the mean aortic valve gradient has   increased from 34 mmHg to 39 mmHg. Aortic stenosis severity is at   least moderate and approaching severe.  ------------------------------------------------------------------- Labs, prior tests, procedures, and surgery: Transthoracic echocardiography (06/13/2018).    The aortic valve showed moderate stenosis.  EF was 65% and PA pressure was 37 (systolic). Aortic valve: peak gradient of 57 mm Hg and mean gradient of 36 mm Hg. GLS: 22.2%.  ------------------------------------------------------------------- Study data:  Strain imaging. Comparison was made to the study of 06/13/2018.  Study status:  Routine.  Procedure:  The patient reported no pain  pre or post test. Transthoracic echocardiography. Image quality was adequate.  Study completion:  There were no complications.          Echocardiography.  M-mode, complete 2D, 3D, spectral Doppler, and color Doppler.  Birthdate:  Patient birthdate: 1941/08/23.  Age:  Patient is 78 yr old.  Sex:  Gender: female.    BMI: 25.3 kg/m^2.  Blood pressure:     128/62  Patient status:  Outpatient.  Study date:  Study date: 10/31/2018. Study time: 10:14 AM.  Location:  [facility]  -------------------------------------------------------------------  ------------------------------------------------------------------- Left ventricle:  The cavity size was normal. Systolic function was normal. The estimated ejection fraction was in the range of 60% to 65%. Wall motion was normal; there were no regional wall motion abnormalities. Doppler parameters are consistent with abnormal left ventricular relaxation (grade 1 diastolic dysfunction).  Doppler parameters are consistent with high ventricular filling pressure.   ------------------------------------------------------------------- Aortic valve:   Trileaflet; moderately thickened, moderately calcified leaflets. Valve mobility was restricted.  Doppler: There was moderate to severe stenosis.   There was no regurgitation.    VTI ratio of LVOT to aortic valve: 0.27. Valve area (VTI): 0.83 cm^2. Indexed valve area (VTI): 0.46 cm^2/m^2. Peak velocity ratio of LVOT to aortic valve: 0.29. Valve area (Vmax): 0.91 cm^2. Indexed valve area (Vmax): 0.5 cm^2/m^2. Mean velocity ratio of LVOT to aortic valve: 0.28. Valve area (Vmean): 0.88 cm^2. Indexed valve area (Vmean): 0.48 cm^2/m^2.    Mean gradient (S): 39 mm Hg. Peak gradient (S): 64 mm Hg.  ------------------------------------------------------------------- Aorta:  Aortic root: The aortic root was normal in size. Ascending aorta: The ascending aorta was mildly dilated.  ------------------------------------------------------------------- Mitral valve:   Structurally normal valve.   Mobility was not restricted.  Doppler:  Transvalvular velocity was within the normal range. There was no evidence for stenosis. There was mild regurgitation.    Valve area by pressure half-time: 2.44 cm^2. Indexed valve area by pressure half-time: 1.33 cm^2/m^2.    Peak gradient (D): 3 mm Hg.  ------------------------------------------------------------------- Left atrium:  The atrium was normal in size.  ------------------------------------------------------------------- Right ventricle:  The cavity size was normal. Wall thickness was normal. Systolic function was normal.  ------------------------------------------------------------------- Pulmonic valve:    Doppler:  Transvalvular velocity was within the normal range. There was no evidence for stenosis.  ------------------------------------------------------------------- Tricuspid  valve:   Structurally normal valve.    Doppler: Transvalvular velocity was within the normal range. There was mild regurgitation.  ------------------------------------------------------------------- Pulmonary artery:   The main pulmonary artery was normal-sized. Systolic pressure was mildly increased.  ------------------------------------------------------------------- Right atrium:  The atrium was normal in size.  ------------------------------------------------------------------- Pericardium:  There was no pericardial effusion.  ------------------------------------------------------------------- Systemic veins: Inferior vena cava: The vessel was normal in size. The respirophasic diameter changes were in the normal range (>= 50%), consistent with normal central venous pressure.  ------------------------------------------------------------------- Measurements   Left ventricle                            Value          Reference  LV ID, ED, PLAX chordal           (L)     33    mm       43 - 52  LV ID, ES, PLAX chordal           (L)     22    mm  23 - 38  LV fx shortening, PLAX chordal            33    %        >=29  LV PW thickness, ED                       10    mm       ---------  IVS/LV PW ratio, ED                       1              <=1.3  Stroke volume, 2D                         78    ml       ---------  Stroke volume/bsa, 2D                     43    ml/m^2   ---------  LV e&', lateral                            7.35  cm/s     ---------  LV E/e&', lateral                          12.72          ---------  LV e&', medial                             6.32  cm/s     ---------  LV E/e&', medial                           14.79          ---------  LV e&', average                            6.84  cm/s     ---------  LV E/e&', average                          13.68          ---------    Ventricular septum                        Value          Reference  IVS thickness,  ED                         10    mm       ---------    LVOT                                      Value          Reference  LVOT ID, S                                20    mm       ---------  LVOT area                                 3.14  cm^2     ---------  LVOT peak velocity, S                     115   cm/s     ---------  LVOT mean velocity, S                     81.9  cm/s     ---------  LVOT VTI, S                               24.7  cm       ---------  LVOT peak gradient, S                     5     mm Hg    ---------    Aortic valve                              Value          Reference  Aortic valve peak velocity, S             399   cm/s     ---------  Aortic valve mean velocity, S             291   cm/s     ---------  Aortic valve VTI, S                       93.2  cm       ---------  Aortic mean gradient, S                   39    mm Hg    ---------  Aortic peak gradient, S                   64    mm Hg    ---------  VTI ratio, LVOT/AV                        0.27           ---------  Aortic valve area, VTI                    0.83  cm^2     ---------  Aortic valve area/bsa, VTI                0.46  cm^2/m^2 ---------  Velocity ratio, peak, LVOT/AV             0.29           ---------  Aortic valve area, peak velocity          0.91  cm^2     ---------  Aortic valve area/bsa, peak               0.5   cm^2/m^2 ---------  velocity  Velocity ratio, mean, LVOT/AV             0.28           ---------  Aortic valve area, mean velocity  0.88  cm^2     ---------  Aortic valve area/bsa, mean               0.48  cm^2/m^2 ---------  velocity    Aorta                                     Value          Reference  Aortic root ID, ED                        28    mm       ---------  Ascending aorta ID, A-P, S                38    mm       ---------    Left atrium                               Value          Reference  LA ID, A-P, ES                            31    mm        ---------  LA ID/bsa, A-P                            1.7   cm/m^2   <=2.2  LA volume, S                              34.4  ml       ---------  LA volume/bsa, S                          18.8  ml/m^2   ---------  LA volume, ES, 1-p A4C                    31.4  ml       ---------  LA volume/bsa, ES, 1-p A4C                17.2  ml/m^2   ---------  LA volume, ES, 1-p A2C                    36.1  ml       ---------  LA volume/bsa, ES, 1-p A2C                19.7  ml/m^2   ---------    Mitral valve                              Value          Reference  Mitral E-wave peak velocity               93.5  cm/s     ---------  Mitral A-wave peak velocity               103   cm/s     ---------  Mitral deceleration time          (  H)     306   ms       150 - 230  Mitral pressure half-time                 90    ms       ---------  Mitral peak gradient, D                   3     mm Hg    ---------  Mitral E/A ratio, peak                    0.9            ---------  Mitral valve area, PHT, DP                2.44  cm^2     ---------  Mitral valve area/bsa, PHT, DP            1.33  cm^2/m^2 ---------    Pulmonary arteries                        Value          Reference  PA pressure, S, DP                (H)     41    mm Hg    <=30    Tricuspid valve                           Value          Reference  Tricuspid regurg peak velocity            308   cm/s     ---------  Tricuspid peak RV-RA gradient             38    mm Hg    ---------    Systemic veins                            Value          Reference  Estimated CVP                             3     mm Hg    ---------    Right ventricle                           Value          Reference  TAPSE                                     15.4  mm       ---------  RV pressure, S, DP                (H)     41    mm Hg    <=30  RV s&', lateral, S                         8.31  cm/s     ---------  Legend: (L)  and  (H)  mark values outside specified reference  range.  ------------------------------------------------------------------- Prepared and Electronically Authenticated by  [name], MD 2020-01-03T14:19:38   Physicians   Panel Physicians Referring Physician Case Authorizing Physician  Sherren Mocha, MD (Primary)    Procedures   RIGHT/LEFT HEART CATH AND CORONARY ANGIOGRAPHY  Conclusion   1. Angiographically normal coronary arteries (left dominant) 2. Calcified, restricted aortic valve by plain fluoroscopy with hemodynamic findings consistent with moderate aortic stenosis (mean gradient 17 mmHg, AVA 1.47 square cm) 3. Normal right heart pressures  The patient has progressive dyspnea, NYHA III sx's, with echo findings consistent with severe AS. Will proceed with multidisciplinary evaluation of aortic stenosis.  Indications   Severe aortic stenosis [I35.0 (ICD-10-CM)]  Procedural Details   Technical Details INDICATION: Severe symptomatic aortic stenosis  PROCEDURAL DETAILS: The right wrist was prepped, draped, and anesthetized with 1% lidocaine. Using the modified Seldinger technique, a 5/6 French Slender sheath was introduced into the right radial artery. 3 mg of verapamil was administered through the sheath, weight-based unfractionated heparin was administered intravenously. Standard Judkins catheters were used for selective coronary angiography. LV pressure is recorded with a JR4 catheter and a pullback gradient is recorded. Catheter exchanges were performed over an exchange length guidewire. RHC is performed from antecubital access with a 4/5 slender sheath and a 5 Fr Swan ganz catheter. Right heart pressures are recorded and O2 saturations obtained. There were no immediate procedural complications. A TR band was used for radial hemostasis at the completion of the procedure. The patient was transferred to the post catheterization recovery area for further monitoring.    Estimated blood loss <50 mL.   During this  procedure medications were administered to achieve and maintain moderate conscious sedation while the patient's heart rate, blood pressure, and oxygen saturation were continuously monitored and I was present face-to-face 100% of this time.  Medications  (Filter: Administrations occurring from 11/27/18 0715 to 11/27/18 0813)  Medication Rate/Dose/Volume Action  Date Time   Heparin (Porcine) in NaCl 1000-0.9 UT/500ML-% SOLN (mL) 500 mL Given 11/27/18 0723   Total dose as of 12/11/18 1417 500 mL Given 0723   1,000 mL        fentaNYL (SUBLIMAZE) injection (mcg) 25 mcg Given 11/27/18 0741   Total dose as of 12/11/18 1417        25 mcg        midazolam (VERSED) injection (mg) 1 mg Given 11/27/18 0741   Total dose as of 12/11/18 1417        1 mg        lidocaine (PF) (XYLOCAINE) 1 % injection (mL) 1 mL Given 11/27/18 0746   Total dose as of 12/11/18 1417 2 mL Given 0748   3 mL        Radial Cocktail/Verapamil only (mL) 10 mL Given 11/27/18 0749   Total dose as of 12/11/18 1417        10 mL        heparin injection (Units) 3,500 Units Given 11/27/18 0756   Total dose as of 12/11/18 1417        3,500 Units        iohexol (OMNIPAQUE) 350 MG/ML injection (mL) 30 mL Given 11/27/18 0804   Total dose as of 12/11/18 1417        30 mL        Sedation Time   Sedation Time Physician-1: 24 minutes 20 seconds  Coronary Findings   Diagnostic  Dominance: Left  Left Main  Vessel was injected. Vessel is normal in  caliber. Vessel is angiographically normal. Short left main, angiographically normal  Left Anterior Descending  Vessel was injected. Vessel is normal in caliber. Vessel is angiographically normal. The vessel is tortuous. The LAD is tortuous but otherwise is angiographically normal. It supplies a large first diagonal branch.  Left Circumflex  Vessel was injected. Vessel is large. Vessel is angiographically normal. The circumflex is angiographically normal, dominant vessel  Right Coronary Artery   Vessel is moderate in size. Vessel is angiographically normal.  Intervention   No interventions have been documented.  Coronary Diagrams   Diagnostic  Dominance: Left    Intervention   Implants    No implant documentation for this case.  Syngo Images   Show images for CARDIAC CATHETERIZATION  MERGE Images   Show images for CARDIAC CATHETERIZATION   Link to Procedure Log   Procedure Log    Hemo Data    Most Recent Value  Fick Cardiac Output 6.1 L/min  Fick Cardiac Output Index 3.42 (L/min)/BSA  Aortic Mean Gradient 17.42 mmHg  Aortic Peak Gradient 20 mmHg  Aortic Valve Area 1.47  Aortic Value Area Index 0.82 cm2/BSA  RA A Wave 5 mmHg  RA V Wave 4 mmHg  RA Mean 3 mmHg  RV Systolic Pressure 29 mmHg  RV Diastolic Pressure 0 mmHg  RV EDP 3 mmHg  PA Systolic Pressure 29 mmHg  PA Diastolic Pressure 12 mmHg  PA Mean 16 mmHg  PW A Wave 8 mmHg  PW V Wave 8 mmHg  PW Mean 8 mmHg  AO Systolic Pressure 96 mmHg  AO Diastolic Pressure 44 mmHg  AO Mean 66 mmHg  LV Systolic Pressure 502 mmHg  LV Diastolic Pressure 4 mmHg  LV EDP 7 mmHg  AOp Systolic Pressure 774 mmHg  AOp Diastolic Pressure 44 mmHg  AOp Mean Pressure 68 mmHg  LVp Systolic Pressure 128 mmHg  LVp Diastolic Pressure 0 mmHg  LVp EDP Pressure 11 mmHg  QP/QS 1    ADDENDUM REPORT: 12/10/2018 07:52  EXAM: OVER-READ INTERPRETATION  CT CHEST  The following report is an over-read performed by radiologist Dr. Rebekah [facility] on 12/10/2018. This over-read does not include interpretation of cardiac or coronary anatomy or pathology. The calcium score and cardiac CTA interpretation by the cardiologist is attached.  COMPARISON:  Cardiac CTA 09/02/2018.  FINDINGS: Extracardiac findings will be described separately under dictation for contemporaneously obtained CTA chest, abdomen and pelvis.  IMPRESSION: Please see separate dictation for contemporaneously obtained  CTA chest, abdomen and pelvis dated 12/09/2018 for full description of relevant extracardiac findings.   Electronically Signed   By: Vinnie Langton M.D.   On: 12/10/2018 07:52   Addended by Etheleen Mayhew, MD on 12/10/2018 7:55 AM    Study Result   CLINICAL DATA:  Aortic Stenosis  EXAM: Cardiac TAVR CT  TECHNIQUE: The patient was scanned on a Siemens Force 786 slice scanner. A 120 kV retrospective scan was triggered in the ascending thoracic aorta at 140 HU's. Gantry rotation speed was 250 msecs and collimation was .6 mm. No beta blockade or nitro were given. The 3D data set was reconstructed in 5% intervals of the R-R cycle. Systolic and diastolic phases were analyzed on a dedicated work station using MPR, MIP and VRT modes. The patient received 80 cc of contrast.  FINDINGS: Aortic Valve: Functionally bicuspid with fusion of left and righ cusps  Aorta: Dilated 3.8 cm normal arch vessels no coarctation  Sino-tubular Junction: 27 mm  Ascending Thoracic  Aorta: 38 mm  Aortic Arch: 21 mm  Descending Thoracic Aorta: 23 mm  Sinus of Valsalva Measurements:  Non-coronary: 27.3 mm  Right - coronary: 25.8 mm  Left -   coronary: 27.8 mm  Coronary Artery Height above Annulus:  Left Main: 13.3 mm above annulus  Right Coronary: 11.2 mm above annulus  Virtual Basal Annulus Measurements:  Maximum / Minimum Diameter: 21.3 mm x 19.7 mm  Perimeter: 66 mm  Area: 330 mm2  Coronary Arteries: Sufficient height above annulus for deployment  Optimum Fluoroscopic Angle for Delivery: LAO 17 Caudal 23 degrees  IMPRESSION: 1. Functionally bicuspid AV with annular area of 330 mm2 suitable for a 20 mm Sapien 3 valve  2.  Mild aortic root dilatation 3.8 cm  3.  Coronary arteries sufficient height above annulus for deployment  4. Optimum angiographic angle for deployment LAO 17 degrees Caudal 23 degrees  Jenkins Rouge  Electronically  Signed: By: Jenkins Rouge M.D. On: 12/09/2018 14:18       CLINICAL DATA:  78 year old female with history of severe aortic stenosis. Preprocedural study prior to potential transcatheter aortic valve replacement (TAVR) procedure.  EXAM: CT ANGIOGRAPHY CHEST, ABDOMEN AND PELVIS  TECHNIQUE: Multidetector CT imaging through the chest, abdomen and pelvis was performed using the standard protocol during bolus administration of intravenous contrast. Multiplanar reconstructed images and MIPs were obtained and reviewed to evaluate the vascular anatomy.  CONTRAST:  162mL ISOVUE-370 IOPAMIDOL (ISOVUE-370) INJECTION 76%  COMPARISON:  Cardiac CTA 09/02/2018.  Chest CT 12/24/2016.  FINDINGS: CTA CHEST FINDINGS  Cardiovascular: Heart size is borderline enlarged. There is no significant pericardial fluid, thickening or pericardial calcification. There is aortic atherosclerosis, as well as atherosclerosis of the great vessels of the mediastinum and the coronary arteries, including calcified atherosclerotic plaque in the left anterior descending coronary artery. Ectasia of ascending thoracic aorta (4.0 cm in diameter). Severe thickening calcification of the aortic valve.  Mediastinum/Lymph Nodes: No pathologically enlarged mediastinal or hilar lymph nodes. Esophagus is unremarkable in appearance. No axillary lymphadenopathy.  Lungs/Pleura: Multiple small pulmonary nodules scattered throughout the lungs bilaterally measuring 4 mm or less in size. No larger more suspicious appearing pulmonary nodules or masses are noted. No acute consolidative airspace disease. No pleural effusions.  Musculoskeletal/Soft Tissues: There are no aggressive appearing lytic or blastic lesions noted in the visualized portions of the skeleton.  CTA ABDOMEN AND PELVIS FINDINGS  Hepatobiliary: No suspicious cystic or solid hepatic lesions. No intra or extrahepatic biliary ductal dilatation.  Gallbladder is normal in appearance.  Pancreas: No pancreatic mass. No pancreatic ductal dilatation. No pancreatic or peripancreatic fluid or inflammatory changes.  Spleen: Unremarkable.  Adrenals/Urinary Tract: Low-attenuation lesions in both kidneys, compatible with simple cysts, largest of which is in the interpolar region of the left kidney where there is a 2.4 cm low-attenuation peripelvic cyst. No suspicious renal lesions. No hydroureteronephrosis. Urinary bladder is largely obscured by beam hardening artifact from bilateral hip arthroplasties, but visualized portions are normal in appearance. Bilateral adrenal glands are normal in appearance.  Stomach/Bowel: Normal appearance of the stomach. No pathologic dilatation of small bowel or colon. Numerous colonic diverticulae are noted, particularly in the sigmoid colon, without surrounding inflammatory changes to suggest an acute diverticulitis at this time. The appendix is not confidently identified and may be surgically absent. Regardless, there are no inflammatory changes noted adjacent to the cecum to suggest the presence of an acute appendicitis at this time.  Vascular/Lymphatic: Aortic atherosclerosis, with vascular findings and measurements pertinent to potential  TAVR procedure, as detailed below. No aneurysm or dissection noted in the abdominal or pelvic vasculature. No lymphadenopathy noted in the abdomen or pelvis.  Reproductive: Uterus and ovaries are partially obscured by beam hardening artifact from the patient's bilateral hip arthroplasties, but are generally unremarkable in appearance.  Other: No significant volume of ascites.  No pneumoperitoneum.  Musculoskeletal: Postprocedural changes of sacroplasty are noted bilaterally. Old healed fractures of the left superior and inferior pubic rami. Status post bilateral hip arthroplasty. There are no aggressive appearing lytic or blastic lesions noted in  the visualized portions of the skeleton.  VASCULAR MEASUREMENTS PERTINENT TO TAVR:  AORTA:  Minimal Aortic Diameter-1.4 x 1.1 mm  Severity of Aortic Calcification-moderate to severe  RIGHT PELVIS:  Right Common Iliac Artery -  Minimal Diameter-7.5 x 6.3 mm  Tortuosity-mild  Calcification-moderate  Right External Iliac Artery -  Minimal Diameter-5.7 x 5.3 mm  Tortuosity - mild  Calcification-mild  Right Common Femoral Artery -  Minimal Diameter-6.0 x 6.1 mm  Tortuosity - mild  Calcification-none  LEFT PELVIS:  Left Common Iliac Artery -  Minimal Diameter-6.2 x 6.6 mm  Tortuosity - mild  Calcification-moderate  Left External Iliac Artery -  Minimal Diameter-6.1 x 6.3 mm  Tortuosity - mild  Calcification-none  Left Common Femoral Artery -  Minimal Diameter-5.7 x 4.9 mm  Tortuosity - mild  Calcification-mild  Review of the MIP images confirms the above findings.  IMPRESSION: 1. Vascular findings and measurements pertinent to potential TAVR procedure, as detailed above. 2. Severe thickening calcification of the aortic valve, compatible with the reported clinical history of severe aortic stenosis. 3. Ectasia of the ascending thoracic aorta (4.0 cm in diameter). Recommend annual imaging followup by CTA or MRA. This recommendation follows 2010 ACCF/AHA/AATS/ACR/ASA/SCA/SCAI/SIR/STS/SVM Guidelines for the Diagnosis and Management of Patients with Thoracic Aortic Disease. Circulation. 2010; 121: F790-W409. Aortic aneurysm NOS (ICD10-I71.9). 4. Multiple small pulmonary nodules measuring 4 mm or less in size scattered throughout the lungs bilaterally. These are nonspecific, but statistically likely benign. No follow-up needed if patient is low-risk (and has no known or suspected primary neoplasm). Non-contrast chest CT can be considered in 12 months if patient is high-risk. This recommendation follows the consensus  statement: Guidelines for Management of Incidental Pulmonary Nodules Detected on CT Images: From the Fleischner Society 2017; Radiology 2017; 284:228-243. 5. Colonic diverticulosis without evidence of acute diverticulitis at this time. 6. Additional incidental findings, as above.   Electronically Signed   By: Vinnie Langton M.D.   On: 12/10/2018 09:28   STS Risk Calculator: Procedure: Isolated AVR Risk of Mortality:  2.051% Renal Failure:  0.980% Permanent Stroke:  1.266% Prolonged Ventilation:  5.875% DSW Infection:  0.066% Reoperation:  2.971% Morbidity or Mortality:  9.316% Short Length of Stay:  34.539% Long Length of Stay:  4.945%  Impression:  This 78 year old woman has stage D, severe, symptomatic aortic stenosis with New York Heart Association class III symptoms of exertional fatigue and shortness of breath consistent with chronic diastolic congestive heart failure.  Her symptoms seem to have worsened over the past few weeks.  I have personally reviewed her 2D echocardiogram, cardiac catheterization, and CTA images.  Her echocardiogram shows a trileaflet aortic valve with moderate thickening and calcification of the leaflets with restricted mobility.  The mean transvalvular gradient has increased to 39 mmHg consistent with severe aortic stenosis.  Left ventricular function is normal.  Cardiac catheterization shows normal coronary arteries.  The mean gradient measured across aortic valve was only 17  mmHg which is not consistent with the echocardiogram data but her valve looks like a severely stenotic valve.  Right heart pressures are normal.  I agree that aortic valve replacement is indicated in this patient with severe aortic stenosis and progressive symptoms.  Her operative risk for open surgical aortic valve replacement would be relatively low although given her small annulus size I think she would probably require aortic root replacement with a porcine root to have  an adequate sized valve with a low gradient.  This would give her a higher operative risk.  She is 78 years old with severe obstructive lung disease and severe reduction in diffusion capacity on PFTs.  She has also had bilateral hip replacements.  She would rather have a transcatheter aortic valve replacement if possible and I agree that it is probably the best option for her.  Her gated cardiac CTA shows a functionally bicuspid aortic valve with an annular area of 330 mm which is relatively small and only suitable for a 20 mm Sapien 3 valve.  Her annular dimensions appear to be adequate for a 26 mm Medtronic Evolut valve.  Her abdominal and pelvic CTA shows pelvic arterial anatomy that is suitable for transfemoral insertion using an inline sheath with the Medtronic valve.  The patient and her son and daughter-in-law were counseled at length regarding treatment alternatives for management of severe symptomatic aortic stenosis. The risks and benefits of surgical intervention has been discussed in detail. Long-term prognosis with medical therapy was discussed. Alternative approaches such as conventional surgical aortic valve replacement, transcatheter aortic valve replacement, and palliative medical therapy were compared and contrasted at length. This discussion was placed in the context of the patient's own specific clinical presentation and past medical history. All of their questions have been addressed.   Following the decision to proceed with transcatheter aortic valve replacement, a discussion was held regarding what types of management strategies would be attempted intraoperatively in the event of life-threatening complications, including whether or not the patient would be considered a candidate for the use of cardiopulmonary bypass and/or conversion to open sternotomy for attempted surgical intervention. The patient is aware of the fact that transient use of cardiopulmonary bypass may be necessary.  I  think she would certainly be a candidate for sternotomy if needed to address any intraoperative emergencies.  The patient has been advised of a variety of complications that might develop including but not limited to risks of death, stroke, paravalvular leak, aortic dissection or other major vascular complications, aortic annulus rupture, device embolization, cardiac rupture or perforation, mitral regurgitation, acute myocardial infarction, arrhythmia, heart block or bradycardia requiring permanent pacemaker placement, congestive heart failure, respiratory failure, renal failure, pneumonia, infection, other late complications related to structural valve deterioration or migration, or other complications that might ultimately cause a temporary or permanent loss of functional independence or other long term morbidity. The patient provides full informed consent for the procedure as described and all questions were answered.    Plan:  We will discuss her case with our multidisciplinary structural heart valve team and if we agree to proceed with transcatheter aortic valve replacement we will tentatively schedule this for 12/30/2018.    I spent 60 minutes performing this consultation and > 50% of this time was spent face to face counseling and coordinating the care of this patient's severe symptomatic aortic stenosis.  Gaye Pollack, MD 12/10/2018

## 2018-12-16 ENCOUNTER — Other Ambulatory Visit: Payer: Self-pay

## 2018-12-16 DIAGNOSIS — I350 Nonrheumatic aortic (valve) stenosis: Secondary | ICD-10-CM

## 2018-12-19 ENCOUNTER — Other Ambulatory Visit (HOSPITAL_COMMUNITY): Payer: Medicare Other

## 2018-12-22 ENCOUNTER — Encounter: Payer: Self-pay | Admitting: Physician Assistant

## 2018-12-25 NOTE — Pre-Procedure Instructions (Signed)
Dominique [name]  12/25/2018      [id number] - [address], [address] [place] [address] Phone: [phone] Fax: [phone]    Your procedure is scheduled on December 30, 2018.  Report to [facility] Entrance "A" at 700 AM.  Call this number if you have problems the morning of surgery:  [phone]   Remember:  Do not eat or drink after midnight.    Take these medicines the morning of surgery with A SIP OF WATER -none  Follow your surgeon's instructions on when to hold/resume aspirin.  If no instructions were given call the office to determine how they would like to you take aspirin   Beginning now, STOP taking any Aleve, Naproxen, Ibuprofen, Motrin, Advil, Goody's, BC's, all herbal medications, fish oil, and all vitamins   Do not wear jewelry, make-up or nail polish.  Do not wear lotions, powders, or perfumes, or deodorant.  Do not shave 48 hours prior to surgery.    Do not bring valuables to the hospital.  [facility] is not responsible for any belongings or valuables.  Contacts, dentures or bridgework may not be worn into surgery.  Leave your suitcase in the car.  After surgery it may be brought to your room.  For patients admitted to the hospital, discharge time will be determined by your treatment team.  Patients discharged the day of surgery will not be allowed to drive home.    [facility]- Preparing For Surgery  Before surgery, you can play an important role. Because skin is not sterile, your skin needs to be as free of germs as possible. You can reduce the number of germs on your skin by washing with CHG (chlorahexidine gluconate) Soap before surgery.  CHG is an antiseptic cleaner which kills germs and bonds with the skin to continue killing germs even after washing.    Oral Hygiene is also important to reduce your risk of infection.  Remember - BRUSH YOUR TEETH THE MORNING OF SURGERY WITH YOUR REGULAR  TOOTHPASTE  Please do not use if you have an allergy to CHG or antibacterial soaps. If your skin becomes reddened/irritated stop using the CHG.  Do not shave (including legs and underarms) for at least 48 hours prior to first CHG shower. It is OK to shave your face.  Please follow these instructions carefully.   1. Shower the NIGHT BEFORE SURGERY and the MORNING OF SURGERY with CHG.   2. If you chose to wash your hair, wash your hair first as usual with your normal shampoo.  3. After you shampoo, rinse your hair and body thoroughly to remove the shampoo.  4. Use CHG as you would any other liquid soap. You can apply CHG directly to the skin and wash gently with a scrungie or a clean washcloth.   5. Apply the CHG Soap to your body ONLY FROM THE NECK DOWN.  Do not use on open wounds or open sores. Avoid contact with your eyes, ears, mouth and genitals (private parts). Wash Face and genitals (private parts)  with your normal soap.  6. Wash thoroughly, paying special attention to the area where your surgery will be performed.  7. Thoroughly rinse your body with warm water from the neck down.  8. DO NOT shower/wash with your normal soap after using and rinsing off the CHG Soap.  9. Pat yourself dry with a CLEAN TOWEL.  10. Wear CLEAN PAJAMAS to bed the night before  surgery, wear comfortable clothes the morning of surgery  11. Place CLEAN SHEETS on your bed the night of your first shower and DO NOT SLEEP WITH PETS.  Day of Surgery:  Do not apply any deodorants/lotions.  Please wear clean clothes to the hospital/surgery center.   Remember to brush your teeth WITH YOUR REGULAR TOOTHPASTE.  Please read over the following fact sheets that you were given.

## 2018-12-26 ENCOUNTER — Other Ambulatory Visit (HOSPITAL_COMMUNITY): Payer: Medicare Other

## 2018-12-26 ENCOUNTER — Encounter (HOSPITAL_COMMUNITY)
Admission: RE | Admit: 2018-12-26 | Discharge: 2018-12-26 | Disposition: A | Discharge status: Home / Self Care | Payer: Medicare Other | Source: Ambulatory Visit | Attending: Cardiovascular Disease | Admitting: Cardiovascular Disease

## 2018-12-26 ENCOUNTER — Encounter (HOSPITAL_COMMUNITY): Payer: Self-pay

## 2018-12-26 ENCOUNTER — Other Ambulatory Visit: Payer: Self-pay

## 2018-12-26 ENCOUNTER — Ambulatory Visit (HOSPITAL_COMMUNITY)
Admission: RE | Admit: 2018-12-26 | Discharge: 2018-12-26 | Disposition: A | Discharge status: Home / Self Care | Payer: Medicare Other | Source: Ambulatory Visit | Attending: Cardiovascular Disease | Admitting: Cardiovascular Disease

## 2018-12-26 DIAGNOSIS — Z01818 Encounter for other preprocedural examination: Secondary | ICD-10-CM | POA: Diagnosis not present

## 2018-12-26 DIAGNOSIS — I350 Nonrheumatic aortic (valve) stenosis: Secondary | ICD-10-CM | POA: Diagnosis not present

## 2018-12-26 DIAGNOSIS — J439 Emphysema, unspecified: Secondary | ICD-10-CM | POA: Diagnosis not present

## 2018-12-26 LAB — URINALYSIS, ROUTINE W REFLEX MICROSCOPIC
Bilirubin Urine: NEGATIVE
Glucose, UA: NEGATIVE mg/dL
Hgb urine dipstick: NEGATIVE
Ketones, ur: NEGATIVE mg/dL
Nitrite: NEGATIVE
PROTEIN: 100 mg/dL — AB
Specific Gravity, Urine: 1.019 (ref 1.005–1.030)
WBC, UA: >50 WBC/hpf — ABNORMAL HIGH (ref 0–5)
pH: 5 (ref 5.0–8.0)

## 2018-12-26 LAB — CBC
HCT: 40.2 % (ref 36.0–46.0)
Hemoglobin: 12.9 g/dL (ref 12.0–15.0)
MCH: 29.7 pg (ref 26.0–34.0)
MCHC: 32.1 g/dL (ref 30.0–36.0)
MCV: 92.6 fL (ref 80.0–100.0)
Platelets: 310 10*3/uL (ref 150–400)
RBC: 4.34 MIL/uL (ref 3.87–5.11)
RDW: 13.9 % (ref 11.5–15.5)
WBC: 13.8 10*3/uL — ABNORMAL HIGH (ref 4.0–10.5)
nRBC: 0 % (ref 0.0–0.2)

## 2018-12-26 LAB — BLOOD GAS, ARTERIAL
Acid-base deficit: 0.5 mmol/L (ref 0.0–2.0)
Bicarbonate: 23.4 mmol/L (ref 20.0–28.0)
Drawn by: 421801
FIO2: 21
O2 Saturation: 97.9 %
Patient temperature: 98.6
pCO2 arterial: 37.2 mmHg (ref 32.0–48.0)
pH, Arterial: 7.416 (ref 7.350–7.450)
pO2, Arterial: 103 mmHg (ref 83.0–108.0)

## 2018-12-26 LAB — COMPREHENSIVE METABOLIC PANEL
ALT: 20 U/L (ref 0–44)
AST: 25 U/L (ref 15–41)
Albumin: 3.4 g/dL — ABNORMAL LOW (ref 3.5–5.0)
Alkaline Phosphatase: 44 U/L (ref 38–126)
Anion gap: 12 (ref 5–15)
BUN: 18 mg/dL (ref 8–23)
CHLORIDE: 106 mmol/L (ref 98–111)
CO2: 20 mmol/L — ABNORMAL LOW (ref 22–32)
Calcium: 9.1 mg/dL (ref 8.9–10.3)
Creatinine, Ser: 0.89 mg/dL (ref 0.44–1.00)
GFR calc Af Amer: >60 mL/min (ref >60)
GFR calc non Af Amer: >60 mL/min (ref >60)
Glucose, Bld: 104 mg/dL — ABNORMAL HIGH (ref 70–99)
Potassium: 4 mmol/L (ref 3.5–5.1)
SODIUM: 138 mmol/L (ref 135–145)
Total Bilirubin: 0.5 mg/dL (ref 0.3–1.2)
Total Protein: 6.8 g/dL (ref 6.5–8.1)

## 2018-12-26 LAB — SURGICAL PCR SCREEN
MRSA, PCR: NEGATIVE
Staphylococcus aureus: POSITIVE — AB

## 2018-12-26 LAB — APTT: aPTT: 27 seconds (ref 24–36)

## 2018-12-26 LAB — BRAIN NATRIURETIC PEPTIDE: B NATRIURETIC PEPTIDE 5: 65.3 pg/mL (ref 0.0–100.0)

## 2018-12-26 LAB — TYPE AND SCREEN
ABO/RH(D): A POS
ANTIBODY SCREEN: NEGATIVE

## 2018-12-26 LAB — HEMOGLOBIN A1C
Hgb A1c MFr Bld: 5.9 % — ABNORMAL HIGH (ref 4.8–5.6)
Mean Plasma Glucose: 122.63 mg/dL

## 2018-12-26 LAB — ABO/RH: ABO/RH(D): A POS

## 2018-12-26 LAB — PROTIME-INR
INR: 1 (ref 0.8–1.2)
Prothrombin Time: 13.5 seconds (ref 11.4–15.2)

## 2018-12-26 MED ORDER — CIPROFLOXACIN HCL 500 MG PO TABS: ORAL_TABLET | ORAL | 0 refills | Status: DC

## 2018-12-26 NOTE — Progress Notes (Signed)
[facility] - [facility]  Chest x-ray - 12/26/18 EKG - 12/26/18 Stress Test - 2017 ECHO - 11/10/18 Cardiac Cath - 11/27/18   Aspirin Instructions: continue but do not take the morning of surgery  Anesthesia review: yes, cardiac history  Patient denies shortness of breath, fever, cough and chest pain at PAT appointment   Patient verbalized understanding of instructions that were given to them at the PAT appointment. Patient was also instructed that they will need to review over the PAT instructions again at home before surgery.

## 2018-12-26 NOTE — Progress Notes (Signed)
Lauren RN with Structural heart Program made aware of patients urinalysis

## 2018-12-26 NOTE — Progress Notes (Signed)
Bilateral carotid duplex completed. Preliminary results in Chart review CV Proc. [place] 12/26/2018, 9:44 AM

## 2018-12-29 MED ORDER — MAGNESIUM SULFATE 50 % IJ SOLN
40.0000 meq | INTRAMUSCULAR | Status: DC
  Filled 2018-12-29: qty 9.85

## 2018-12-29 MED ORDER — SODIUM CHLORIDE 0.9 % IV SOLN
1.5000 g | INTRAVENOUS | Status: AC
  Administered 2018-12-30: 1.5 g via INTRAVENOUS
  Filled 2018-12-29: qty 1.5

## 2018-12-29 MED ORDER — NOREPINEPHRINE 4 MG/250ML-% IV SOLN
0.0000 ug/min | INTRAVENOUS | Status: AC
  Administered 2018-12-30: 2 ug/min via INTRAVENOUS
  Filled 2018-12-29: qty 250

## 2018-12-29 MED ORDER — DEXMEDETOMIDINE HCL IN NACL 400 MCG/100ML IV SOLN
0.1000 ug/kg/h | INTRAVENOUS | Status: AC
  Administered 2018-12-30: 1 ug/kg/h via INTRAVENOUS
  Filled 2018-12-29 (×2): qty 100

## 2018-12-29 MED ORDER — SODIUM CHLORIDE 0.9 % IV SOLN
INTRAVENOUS | Status: DC
  Filled 2018-12-29: qty 30

## 2018-12-29 MED ORDER — POTASSIUM CHLORIDE 2 MEQ/ML IV SOLN
80.0000 meq | INTRAVENOUS | Status: DC
  Filled 2018-12-29: qty 40

## 2018-12-29 MED ORDER — VANCOMYCIN HCL 10 G IV SOLR
1250.0000 mg | INTRAVENOUS | Status: AC
  Administered 2018-12-30: 1250 mg via INTRAVENOUS
  Filled 2018-12-29: qty 1250

## 2018-12-29 NOTE — Anesthesia Preprocedure Evaluation (Addendum)
Anesthesia Evaluation  Patient identified by MRN, date of birth, ID band Patient awake    Reviewed: Allergy & Precautions, NPO status , Patient's Chart, lab work & pertinent test results  History of Anesthesia Complications (+) PONV and history of anesthetic complications  Airway Mallampati: II  TM Distance: >3 FB Neck ROM: Full    Dental no notable dental hx.    Pulmonary COPD,  COPD inhaler, former smoker,    Pulmonary exam normal breath sounds clear to auscultation       Cardiovascular hypertension, Pt. on medications + Valvular Problems/Murmurs AS  Rhythm:Regular Rate:Normal + Systolic murmurs Pulmonary HTN  The mean gradient increased further to 39 mmHg with a peak gradient of 64 mmHg.  There is no regurgitation.  Aortic valve area was measured at 0.88 cm.  Left ventricular ejection fraction was 60 to 65% with grade 1 diastolic dysfunction.     Neuro/Psych negative neurological ROS  negative psych ROS   GI/Hepatic negative GI ROS, Neg liver ROS,   Endo/Other  negative endocrine ROS  Renal/GU negative Renal ROS     Musculoskeletal negative musculoskeletal ROS (+)   Abdominal   Peds  Hematology HLD   Anesthesia Other Findings Severe Aortic Stenosis  Reproductive/Obstetrics                            Anesthesia Physical Anesthesia Plan  ASA: IV  Anesthesia Plan: MAC   Post-op Pain Management:    Induction:   PONV Risk Score and Plan: 2 and Ondansetron, Dexamethasone and Treatment may vary due to age or medical condition  Airway Management Planned: Simple Face Mask  Additional Equipment: Arterial line, CVP and Ultrasound Guidance Line Placement  Intra-op Plan:   Post-operative Plan:   Informed Consent: I have reviewed the patients History and Physical, chart, labs and discussed the procedure including the risks, benefits and alternatives for the proposed anesthesia with the  patient or authorized representative who has indicated his/her understanding and acceptance.     Dental advisory given  Plan Discussed with: CRNA  Anesthesia Plan Comments:        Anesthesia Quick Evaluation

## 2018-12-29 NOTE — H&P (Signed)
[address]       [place] [address]             [phone]      Cardiothoracic Surgery Admission History and Physical   Referring Provider is [name], MD PCP is Colon Branch, MD      Chief Complaint  Patient presents with  . Aortic Stenosis        HPI:  The patient is a 78 year old woman with history of hypertension, hyperlipidemia, and aortic stenosis that has been followed by Dr. [name] with serial echocardiograms.  She said that she was told as a child in [place] that she had some abnormality of her aortic valve.  An echocardiogram in 05/2017 showed what appeared to be a trileaflet aortic valve with a mean gradient of 21 mmHg consistent with moderate stenosis.  She had a follow-up echocardiogram in 05/2018 which showed an increase in the mean gradient to 36 mmHg with normal left ventricular function but she remained asymptomatic.  She now presents with a several month history of progressive exertional fatigue and shortness of breath.  Over the past couple weeks it seems to have worsened and she has having shortness of breath with mild to moderate activity.  Sometimes even taking a shower wears her out.  She has noted some exertional chest pressure.  She has had no dizziness or syncope.  She denies orthopnea and PND.  She has had some swelling in her ankles and feet intermittently.  A repeat echocardiogram on 10/31/2018 shows a trileaflet aortic valve with moderate thickening and calcification of the leaflets with restricted mobility.  The mean gradient increased further to 39 mmHg with a peak gradient of 64 mmHg.  There is no regurgitation.  Aortic valve area was measured at 0.88 cm.  Left ventricular ejection fraction was 60 to 65% with grade 1 diastolic dysfunction.  She underwent cardiac catheterization on 11/27/2018 showing normal coronary arteries.  The mean gradient across aortic valve was measured at 17 mmHg with with a calculated valve area of  1.47 cm.  Right heart pressures were normal.  The patient is here today with her son and daughter-in-law.  She is widowed and lives alone.  She is a remote smoker but quit about 12 years ago.  She has a 40-pack-year smoking history and has been told that she has emphysema.      Past Medical History:  Diagnosis Date  . Aortic stenosis, moderate    echo 04/2016  . Coronary artery calcification seen on CAT scan    no ischemia no nuclear stress test 2017  . Emphysema lung ([place])   . History of blood transfusion 1971   with C-section complication  . Hyperlipidemia    LDL goal < 70  . Hypertension   . Pulmonary HTN ([place]) 06/05/2016   Moderate with PASP 51mmHg by echo 04/2016 likely Group 3 from COPD and possibly Group 2 from pulmonary venous HTN associated with moderate AS  . Sacral fracture ([place])   . UTI (lower urinary tract infection)          Past Surgical History:  Procedure Laterality Date  . APPENDECTOMY  1951  . CATARACT EXTRACTION    . CESAREAN SECTION     1971  . IR RADIOLOGY PERIPHERAL GUIDED IV START  05/30/2017  . IR RADIOLOGY PERIPHERAL GUIDED IV START  09/02/2018  . IR RADIOLOGY PERIPHERAL GUIDED IV START  09/02/2018  . IR US GUIDE VASC ACCESS  LEFT  09/02/2018  . IR US GUIDE VASC ACCESS RIGHT  05/30/2017  . IR US GUIDE VASC ACCESS RIGHT  09/02/2018  . JOINT REPLACEMENT  2016   sacroplasty  . LAMINECTOMY    . RIGHT/LEFT HEART CATH AND CORONARY ANGIOGRAPHY N/A 11/27/2018   Procedure: RIGHT/LEFT HEART CATH AND CORONARY ANGIOGRAPHY;  Surgeon: Sherren Mocha, MD;  Location: [facility];  Service: Cardiovascular;  Laterality: N/A;  . TOTAL HIP ARTHROPLASTY Bilateral   . TUBAL LIGATION  1975         Family History  Problem Relation Age of Onset  . Breast cancer Mother        breast  . Diabetes Son        type 1  . Cancer Brother        lung  . Colon cancer Neg Hx   . CAD Neg Hx   . Colon polyps Neg Hx   . Rectal  cancer Neg Hx   . Stomach cancer Neg Hx     Social History        Socioeconomic History  . Marital status: Widowed    Spouse name: Not on file  . Number of children: 2  . Years of education: Not on file  . Highest education level: Not on file  Occupational History  . Occupation: n/a  Social Needs  . Financial resource strain: Not on file  . Food insecurity:    Worry: Not on file    Inability: Not on file  . Transportation needs:    Medical: Not on file    Non-medical: Not on file  Tobacco Use  . Smoking status: Former Smoker    Packs/day: 1.00    Years: 40.00    Pack years: 40.00    Types: Cigarettes    Last attempt to quit: 03/19/2004    Years since quitting: 14.7  . Smokeless tobacco: Never Used  Substance and Sexual Activity  . Alcohol use: Yes    Alcohol/week: 3.0 - 4.0 standard drinks    Types: 3 - 4 Glasses of wine per week    Comment: socially  . Drug use: No  . Sexual activity: Not on file  Lifestyle  . Physical activity:    Days per week: Not on file    Minutes per session: Not on file  . Stress: Not on file  Relationships  . Social connections:    Talks on phone: Not on file    Gets together: Not on file    Attends religious service: Not on file    Active member of club or organization: Not on file    Attends meetings of clubs or organizations: Not on file    Relationship status: Not on file  . Intimate partner violence:    Fear of current or ex partner: Not on file    Emotionally abused: Not on file    Physically abused: Not on file    Forced sexual activity: Not on file  Other Topics Concern  . Not on file  Social History Narrative   From [place]   1 living son          Current Outpatient Medications  Medication Sig Dispense Refill  . amLODipine (NORVASC) 10 MG tablet Take 1 tablet (10 mg total) by mouth daily. 90 tablet 3  . aspirin EC 81 MG tablet Take 81 mg by mouth daily.     . benazepril (LOTENSIN) 20 MG tablet Take 1 tablet (20 mg total)  by mouth daily. 90 tablet 2  . Calcium Carb-Cholecalciferol (CALCIUM 600 + D PO) Take 600 mg by mouth 3 (three) times a week.     . calcium carbonate (TUMS - DOSED IN MG ELEMENTAL CALCIUM) 500 MG chewable tablet Chew 2 tablets by mouth daily as needed for indigestion or heartburn.    . Cholecalciferol (VITAMIN D3) 5000 UNITS CAPS Take 5,000 Units by mouth daily.     Marland Kitchen denosumab (PROLIA) 60 MG/ML SOLN injection Inject 60 mg into the skin every 6 (six) months. Administer in upper arm, thigh, or abdomen    . EPINEPHrine 0.3 mg/0.3 mL IJ SOAJ injection Inject 0.3 mg into the muscle as needed for anaphylaxis.   12  . Multiple Vitamin (MULTIVITAMIN WITH MINERALS) TABS tablet Take 1 tablet by mouth daily.    . nitroGLYCERIN (NITROSTAT) 0.4 MG SL tablet Place 1 tablet (0.4 mg total) under the tongue every 5 (five) minutes as needed for chest pain. 25 tablet 3  . rosuvastatin (CRESTOR) 40 MG tablet TAKE 1 TABLET BY MOUTH EVERY DAY 90 tablet 3  . triamcinolone (NASACORT ALLERGY 24HR) 55 MCG/ACT AERO nasal inhaler Place 1 spray into the nose daily as needed (congestion).     No current facility-administered medications for this visit.     No Known Allergies    Review of Systems:              General:                      decreased appetite, decreased energy, no weight gain, + weight loss, no fever             Cardiac:                       + chest pain with exertion, no chest pain at rest, +SOB with mild exertion, no resting SOB, no PND, no orthopnea, no palpitations, no arrhythmia, no atrial fibrillation, + LE edema, no dizzy spells, no syncope             Respiratory:                 + exertional shortness of breath, no home oxygen, no productive cough, + dry cough, no bronchitis, + wheezing, no hemoptysis, no asthma, no pain with inspiration or cough, no sleep apnea, no CPAP at night             GI:                                no difficulty swallowing, no reflux, + frequent heartburn, no hiatal hernia, no abdominal pain, no constipation, no diarrhea, no hematochezia, no hematemesis, no melena             GU:                              no dysuria,  no frequency, no urinary tract infection, no hematuria, no kidney stones, no kidney disease             Vascular:                     no pain suggestive of claudication, no pain in feet, no leg cramps, no varicose veins, no DVT, no non-healing foot ulcer  Neuro:                         no stroke, no TIA's, no seizures, no headaches, no temporary blindness one eye,  no slurred speech, no peripheral neuropathy, no chronic pain, no instability of gait, no memory/cognitive dysfunction             Musculoskeletal:         no arthritis, no joint swelling, no myalgias, no difficulty walking, normal mobility              Skin:                            no rash, no itching, no skin infections, no pressure sores or ulcerations             Psych:                         no anxiety, no depression, no nervousness, no unusual recent stress             Eyes:                           + blurry vision, + floaters, no recent vision changes, does not wear glasses or contacts             ENT:                            no hearing loss, no loose or painful teeth, no dentures, last saw dentist 4 months ago             Hematologic:               no easy bruising, no abnormal bleeding, no clotting disorder, no frequent epistaxis             Endocrine:                   no diabetes, does not check CBG's at home                             Physical Exam:              BP (!) 98/54   Pulse 74   Resp 20   Ht 5\' 6"  (1.676 m)   Wt 153 lb (69.4 kg)   SpO2 93% Comment: RA  BMI 24.69 kg/m              General:                      Elderly but  well-appearing             HEENT:                       Unremarkable, NCAT, PERLA, EOMI, oropharynx clear             Neck:                            no JVD, no bruits, no adenopathy or thyromegaly             Chest:  clear to auscultation, symmetrical breath sounds, no wheezes, no rhonchi              CV:                              RRR, grade III/VI crescendo/decrescendo murmur heard best at RSB,  no diastolic murmur             Abdomen:                    soft, non-tender, no masses              Extremities:                 warm, well-perfused, pulses palpable in feet, no LE edema             Rectal/GU                   Deferred             Neuro:                         Grossly non-focal and symmetrical throughout             Skin:                            Clean and dry, no rashes, no breakdown   Diagnostic Tests:  [facility] [address], [place] [address] [phone]  ------------------------------------------------------------------- Echocardiography  Patient: Mayrin, Schmuck MR #: [id number] Study Date: 10/31/2018 Gender: F Age: 40 Height: 167.6 cm Weight: 70.9 kg BSA: 1.83 m^2 Pt. Status: Room:  ATTENDING Kirk Ruths SONOGRAPHER Wyatt Mage, RDCS PERFORMING [facility], Outpatient ORDERING [name], MD [place], MD  cc:  ------------------------------------------------------------------- LV EF: 60% - 65%  ------------------------------------------------------------------- Indications: Aortic stenosis (I35).  ------------------------------------------------------------------- History: PMH: Dyspnea. Coronary artery disease. Aortic valve disease. Chronic obstructive pulmonary disease. Risk factors: Pulmonary hypertension. Hypertension. Dyslipidemia.  ------------------------------------------------------------------- Study Conclusions  - Left ventricle: The  cavity size was normal. Systolic function was normal. The estimated ejection fraction was in the range of 60% to 65%. Wall motion was normal; there were no regional wall motion abnormalities. Doppler parameters are consistent with abnormal left ventricular relaxation (grade 1 diastolic dysfunction). Doppler parameters are consistent with high ventricular filling pressure. - Aortic valve: Valve mobility was restricted. There was moderate to severe stenosis. There was no regurgitation. Peak velocity (S): 399 cm/s. Mean gradient (S): 39 mm Hg. Peak gradient (S): 64 mm Hg. - Aorta: Ascending aortic diameter: 38 mm (S). - Ascending aorta: The ascending aorta was mildly dilated. - Mitral valve: Transvalvular velocity was within the normal range. There was no evidence for stenosis. There was mild regurgitation. - Right ventricle: The cavity size was normal. Wall thickness was normal. Systolic function was normal. - Pulmonary arteries: Systolic pressure was mildly increased. PA peak pressure: 41 mm Hg (S). - Global longitudinal strain -16.3% (mildly abnormal).  Impressions:  - Compared with the echo 05/2018, the mean aortic valve gradient has increased from 34 mmHg to 39 mmHg. Aortic stenosis severity is at least moderate and approaching severe.  ------------------------------------------------------------------- Labs, prior tests, procedures, and surgery: Transthoracic echocardiography (06/13/2018). The aortic valve showed moderate stenosis. EF was 65% and PA pressure was 37 (systolic). Aortic valve: peak gradient of 57  mm Hg and mean gradient of 36 mm Hg. GLS: 22.2%.  ------------------------------------------------------------------- Study data: Strain imaging. Comparison was made to the study of 06/13/2018. Study status: Routine. Procedure: The patient reported no pain pre or post test. Transthoracic echocardiography. Image quality was  adequate. Study completion: There were no complications. Echocardiography. M-mode, complete 2D, 3D, spectral Doppler, and color Doppler. Birthdate: Patient birthdate: 1941-08-07. Age: Patient is 78 yr old. Sex: Gender: female. BMI: 25.3 kg/m^2. Blood pressure: 128/62 Patient status: Outpatient. Study date: Study date: 10/31/2018. Study time: 10:14 AM. Location: [facility]  -------------------------------------------------------------------  ------------------------------------------------------------------- Left ventricle: The cavity size was normal. Systolic function was normal. The estimated ejection fraction was in the range of 60% to 65%. Wall motion was normal; there were no regional wall motion abnormalities. Doppler parameters are consistent with abnormal left ventricular relaxation (grade 1 diastolic dysfunction). Doppler parameters are consistent with high ventricular filling pressure.  ------------------------------------------------------------------- Aortic valve: Trileaflet; moderately thickened, moderately calcified leaflets. Valve mobility was restricted. Doppler: There was moderate to severe stenosis. There was no regurgitation. VTI ratio of LVOT to aortic valve: 0.27. Valve area (VTI): 0.83 cm^2. Indexed valve area (VTI): 0.46 cm^2/m^2. Peak velocity ratio of LVOT to aortic valve: 0.29. Valve area (Vmax): 0.91 cm^2. Indexed valve area (Vmax): 0.5 cm^2/m^2. Mean velocity ratio of LVOT to aortic valve: 0.28. Valve area (Vmean): 0.88 cm^2. Indexed valve area (Vmean): 0.48 cm^2/m^2. Mean gradient (S): 39 mm Hg. Peak gradient (S): 64 mm Hg.  ------------------------------------------------------------------- Aorta: Aortic root: The aortic root was normal in size. Ascending aorta: The ascending aorta was mildly dilated.  ------------------------------------------------------------------- Mitral valve:  Structurally normal valve. Mobility was not restricted. Doppler: Transvalvular velocity was within the normal range. There was no evidence for stenosis. There was mild regurgitation. Valve area by pressure half-time: 2.44 cm^2. Indexed valve area by pressure half-time: 1.33 cm^2/m^2. Peak gradient (D): 3 mm Hg.  ------------------------------------------------------------------- Left atrium: The atrium was normal in size.  ------------------------------------------------------------------- Right ventricle: The cavity size was normal. Wall thickness was normal. Systolic function was normal.  ------------------------------------------------------------------- Pulmonic valve: Doppler: Transvalvular velocity was within the normal range. There was no evidence for stenosis.  ------------------------------------------------------------------- Tricuspid valve: Structurally normal valve. Doppler: Transvalvular velocity was within the normal range. There was mild regurgitation.  ------------------------------------------------------------------- Pulmonary artery: The main pulmonary artery was normal-sized. Systolic pressure was mildly increased.  ------------------------------------------------------------------- Right atrium: The atrium was normal in size.  ------------------------------------------------------------------- Pericardium: There was no pericardial effusion.  ------------------------------------------------------------------- Systemic veins: Inferior vena cava: The vessel was normal in size. The respirophasic diameter changes were in the normal range (>= 50%), consistent with normal central venous pressure.  ------------------------------------------------------------------- Measurements  Left ventricle Value Reference LV ID, ED, PLAX chordal (L) 33 mm 43 - 52 LV ID, ES,  PLAX chordal (L) 22 mm 23 - 38 LV fx shortening, PLAX chordal 33 % >=29 LV PW thickness, ED 10 mm --------- IVS/LV PW ratio, ED 1 <=1.3 Stroke volume, 2D 78 ml --------- Stroke volume/bsa, 2D 43 ml/m^2 --------- LV e&', lateral 7.35 cm/s --------- LV E/e&', lateral 12.72 --------- LV e&', medial 6.32 cm/s --------- LV E/e&', medial 14.79 --------- LV e&', average 6.84 cm/s --------- LV E/e&', average 13.68 ---------  Ventricular septum Value Reference IVS thickness, ED 10 mm ---------  LVOT Value Reference LVOT ID, S 20 mm --------- LVOT area 3.14 cm^2 --------- LVOT peak velocity, S 115 cm/s --------- LVOT mean velocity, S 81.9 cm/s --------- LVOT VTI, S 24.7 cm --------- LVOT peak gradient, S 5 mm  Hg ---------  Aortic valve Value Reference Aortic valve peak velocity, S 399 cm/s --------- Aortic valve mean velocity, S 291 cm/s --------- Aortic valve VTI, S 93.2 cm --------- Aortic mean gradient, S 39 mm Hg --------- Aortic peak gradient, S 64 mm Hg --------- VTI ratio, LVOT/AV 0.27 --------- Aortic valve area, VTI  0.83 cm^2 --------- Aortic valve area/bsa, VTI 0.46 cm^2/m^2 --------- Velocity ratio, peak, LVOT/AV 0.29 --------- Aortic valve area, peak velocity 0.91 cm^2 --------- Aortic valve area/bsa, peak 0.5 cm^2/m^2 --------- velocity Velocity ratio, mean, LVOT/AV 0.28 --------- Aortic valve area, mean velocity 0.88 cm^2 --------- Aortic valve area/bsa, mean 0.48 cm^2/m^2 --------- velocity  Aorta Value Reference Aortic root ID, ED 28 mm --------- Ascending aorta ID, A-P, S 38 mm ---------  Left atrium Value Reference LA ID, A-P, ES 31 mm --------- LA ID/bsa, A-P 1.7 cm/m^2 <=2.2 LA volume, S 34.4 ml --------- LA volume/bsa, S 18.8 ml/m^2 --------- LA volume, ES, 1-p A4C 31.4 ml --------- LA volume/bsa, ES, 1-p A4C 17.2 ml/m^2 --------- LA volume, ES, 1-p A2C 36.1 ml --------- LA volume/bsa, ES, 1-p A2C 19.7 ml/m^2 ---------  Mitral valve Value Reference Mitral E-wave peak velocity 93.5 cm/s --------- Mitral A-wave peak velocity 103 cm/s --------- Mitral deceleration time (H) 306 ms 150 - 230 Mitral pressure half-time 90 ms --------- Mitral peak gradient, D 3 mm Hg --------- Mitral E/A ratio, peak 0.9 --------- Mitral valve area, PHT, DP 2.44 cm^2 --------- Mitral valve  area/bsa, PHT, DP 1.33 cm^2/m^2 ---------  Pulmonary arteries Value Reference PA pressure, S, DP (H) 41 mm Hg <=30  Tricuspid valve Value Reference Tricuspid regurg peak velocity 308 cm/s --------- Tricuspid peak RV-RA gradient 38 mm Hg ---------  Systemic veins Value Reference Estimated CVP 3 mm Hg ---------  Right ventricle Value Reference TAPSE 15.4 mm --------- RV pressure, S, DP (H) 41 mm Hg <=30 RV s&', lateral, S 8.31 cm/s ---------  Legend: (L) and (H) mark values outside specified reference range.  ------------------------------------------------------------------- Prepared and Electronically Authenticated by  [name], MD 2020-01-03T14:19:38   Physicians   Panel Physicians Referring Physician Case Authorizing Physician  Sherren Mocha, MD (Primary)    Procedures   RIGHT/LEFT HEART CATH AND CORONARY ANGIOGRAPHY  Conclusion   1. Angiographically normal coronary arteries (left dominant) 2. Calcified, restricted aortic valve by plain fluoroscopy with hemodynamic findings consistent with moderate aortic stenosis (mean gradient 17 mmHg, AVA 1.47 square cm) 3. Normal right heart pressures  The patient has progressive dyspnea, NYHA III sx's, with echo findings consistent with severe AS. Will proceed with multidisciplinary evaluation of aortic stenosis.  Indications   Severe aortic stenosis [I35.0 (ICD-10-CM)]  Procedural Details   Technical Details INDICATION: Severe symptomatic aortic stenosis  PROCEDURAL DETAILS: The right wrist was prepped, draped, and anesthetized with 1% lidocaine. Using  the modified Seldinger technique, a 5/6 French Slender sheath was introduced into the right radial artery. 3 mg of verapamil was administered through the sheath, weight-based unfractionated heparin was administered intravenously. Standard Judkins catheters were used for selective coronary angiography. LV pressure is recorded with a JR4 catheter and a pullback gradient is recorded. Catheter exchanges were performed over an exchange length guidewire. RHC is performed from antecubital access with a 4/5 slender sheath and a 5 Fr Swan ganz catheter. Right heart pressures are recorded and O2 saturations obtained. There were no immediate procedural complications. A TR band was used for radial hemostasis at the completion of the procedure. The patient was transferred to the post catheterization recovery area for  further monitoring.    Estimated blood loss <50 mL.   During this procedure medications were administered to achieve and maintain moderate conscious sedation while the patient's heart rate, blood pressure, and oxygen saturation were continuously monitored and I was present face-to-face 100% of this time.  Medications  (Filter: Administrations occurring from 11/27/18 0715 to 11/27/18 0813)          Medication Rate/Dose/Volume Action  Date Time   Heparin (Porcine) in NaCl 1000-0.9 UT/500ML-% SOLN (mL) 500 mL Given 11/27/18 0723   Total dose as of 12/11/18 1417 500 mL Given 0723   1,000 mL        fentaNYL (SUBLIMAZE) injection (mcg) 25 mcg Given 11/27/18 0741   Total dose as of 12/11/18 1417        25 mcg        midazolam (VERSED) injection (mg) 1 mg Given 11/27/18 0741   Total dose as of 12/11/18 1417        1 mg        lidocaine (PF) (XYLOCAINE) 1 % injection (mL) 1 mL Given 11/27/18 0746   Total dose as of 12/11/18 1417 2 mL Given 0748   3 mL        Radial Cocktail/Verapamil only (mL) 10 mL Given 11/27/18 0749   Total dose as of 12/11/18 1417         10 mL        heparin injection (Units) 3,500 Units Given 11/27/18 0756   Total dose as of 12/11/18 1417        3,500 Units        iohexol (OMNIPAQUE) 350 MG/ML injection (mL) 30 mL Given 11/27/18 0804   Total dose as of 12/11/18 1417        30 mL        Sedation Time   Sedation Time Physician-1: 24 minutes 20 seconds  Coronary Findings   Diagnostic  Dominance: Left  Left Main  Vessel was injected. Vessel is normal in caliber. Vessel is angiographically normal. Short left main, angiographically normal  Left Anterior Descending  Vessel was injected. Vessel is normal in caliber. Vessel is angiographically normal. The vessel is tortuous. The LAD is tortuous but otherwise is angiographically normal. It supplies a large first diagonal branch.  Left Circumflex  Vessel was injected. Vessel is large. Vessel is angiographically normal. The circumflex is angiographically normal, dominant vessel  Right Coronary Artery  Vessel is moderate in size. Vessel is angiographically normal.  Intervention   No interventions have been documented.  Coronary Diagrams   Diagnostic  Dominance: Left    Intervention   Implants    No implant documentation for this case.  Syngo Images      Show images for CARDIAC CATHETERIZATION  MERGE Images   Show images for CARDIAC CATHETERIZATION   Link to Procedure Log   Procedure Log    Hemo Data    Most Recent Value  Fick Cardiac Output 6.1 L/min  Fick Cardiac Output Index 3.42 (L/min)/BSA  Aortic Mean Gradient 17.42 mmHg  Aortic Peak Gradient 20 mmHg  Aortic Valve Area 1.47  Aortic Value Area Index 0.82 cm2/BSA  RA A Wave 5 mmHg  RA V Wave 4 mmHg  RA Mean 3 mmHg  RV Systolic Pressure 29 mmHg  RV Diastolic Pressure 0 mmHg  RV EDP 3 mmHg  PA Systolic Pressure 29 mmHg  PA Diastolic Pressure 12 mmHg  PA Mean 16 mmHg  PW A Wave 8 mmHg  PW V Wave 8 mmHg  PW Mean 8 mmHg  AO Systolic Pressure 96  mmHg  AO Diastolic Pressure 44 mmHg  AO Mean 66 mmHg  LV Systolic Pressure 332 mmHg  LV Diastolic Pressure 4 mmHg  LV EDP 7 mmHg  AOp Systolic Pressure 951 mmHg  AOp Diastolic Pressure 44 mmHg  AOp Mean Pressure 68 mmHg  LVp Systolic Pressure 884 mmHg  LVp Diastolic Pressure 0 mmHg  LVp EDP Pressure 11 mmHg  QP/QS 1    ADDENDUM REPORT: 12/10/2018 07:52  EXAM: OVER-READ INTERPRETATION CT CHEST  The following report is an over-read performed by radiologist Dr. Rebekah Chesterfield [facility] on 12/10/2018. This over-read does not include interpretation of cardiac or coronary anatomy or pathology. The calcium score and cardiac CTA interpretation by the cardiologist is attached.  COMPARISON: Cardiac CTA 09/02/2018.  FINDINGS: Extracardiac findings will be described separately under dictation for contemporaneously obtained CTA chest, abdomen and pelvis.  IMPRESSION: Please see separate dictation for contemporaneously obtained CTA chest, abdomen and pelvis dated 12/09/2018 for full description of relevant extracardiac findings.   Electronically Signed By: Vinnie Langton M.D. On: 12/10/2018 07:52   Addended by Etheleen Mayhew, MD on 12/10/2018 7:55 AM    Study Result   CLINICAL DATA: Aortic Stenosis  EXAM: Cardiac TAVR CT  TECHNIQUE: The patient was scanned on a Siemens Force 166 slice scanner. A 120 kV retrospective scan was triggered in the ascending thoracic aorta at 140 HU's. Gantry rotation speed was 250 msecs and collimation was .6 mm. No beta blockade or nitro were given. The 3D data set was reconstructed in 5% intervals of the R-R cycle. Systolic and diastolic phases were analyzed on a dedicated work station using MPR, MIP and VRT modes. The patient received 80 cc of contrast.  FINDINGS: Aortic Valve: Functionally bicuspid with fusion of left and righ cusps  Aorta: Dilated 3.8 cm normal arch vessels no  coarctation  Sino-tubular Junction: 27 mm  Ascending Thoracic Aorta: 38 mm  Aortic Arch: 21 mm  Descending Thoracic Aorta: 23 mm  Sinus of Valsalva Measurements:  Non-coronary: 27.3 mm  Right - coronary: 25.8 mm  Left - coronary: 27.8 mm  Coronary Artery Height above Annulus:  Left Main: 13.3 mm above annulus  Right Coronary: 11.2 mm above annulus  Virtual Basal Annulus Measurements:  Maximum / Minimum Diameter: 21.3 mm x 19.7 mm  Perimeter: 66 mm  Area: 330 mm2  Coronary Arteries: Sufficient height above annulus for deployment  Optimum Fluoroscopic Angle for Delivery: LAO 17 Caudal 23 degrees  IMPRESSION: 1. Functionally bicuspid AV with annular area of 330 mm2 suitable for a 20 mm Sapien 3 valve  2. Mild aortic root dilatation 3.8 cm  3. Coronary arteries sufficient height above annulus for deployment  4. Optimum angiographic angle for deployment LAO 17 degrees Caudal 23 degrees  Jenkins Rouge  Electronically Signed: By: Jenkins Rouge M.D. On: 12/09/2018 14:18       CLINICAL DATA: 78 year old female with history of severe aortic stenosis. Preprocedural study prior to potential transcatheter aortic valve replacement (TAVR) procedure.  EXAM: CT ANGIOGRAPHY CHEST, ABDOMEN AND PELVIS  TECHNIQUE: Multidetector CT imaging through the chest, abdomen and pelvis was performed using the standard protocol during bolus administration of intravenous contrast. Multiplanar reconstructed images and MIPs were obtained and reviewed to evaluate the vascular anatomy.  CONTRAST: 177mL ISOVUE-370 IOPAMIDOL (ISOVUE-370) INJECTION 76%  COMPARISON: Cardiac CTA 09/02/2018. Chest CT 12/24/2016.  FINDINGS: CTA CHEST FINDINGS  Cardiovascular: Heart size is  borderline enlarged. There is no significant pericardial fluid, thickening or pericardial calcification. There is aortic atherosclerosis, as well as atherosclerosis of the  great vessels of the mediastinum and the coronary arteries, including calcified atherosclerotic plaque in the left anterior descending coronary artery. Ectasia of ascending thoracic aorta (4.0 cm in diameter). Severe thickening calcification of the aortic valve.  Mediastinum/Lymph Nodes: No pathologically enlarged mediastinal or hilar lymph nodes. Esophagus is unremarkable in appearance. No axillary lymphadenopathy.  Lungs/Pleura: Multiple small pulmonary nodules scattered throughout the lungs bilaterally measuring 4 mm or less in size. No larger more suspicious appearing pulmonary nodules or masses are noted. No acute consolidative airspace disease. No pleural effusions.  Musculoskeletal/Soft Tissues: There are no aggressive appearing lytic or blastic lesions noted in the visualized portions of the skeleton.  CTA ABDOMEN AND PELVIS FINDINGS  Hepatobiliary: No suspicious cystic or solid hepatic lesions. No intra or extrahepatic biliary ductal dilatation. Gallbladder is normal in appearance.  Pancreas: No pancreatic mass. No pancreatic ductal dilatation. No pancreatic or peripancreatic fluid or inflammatory changes.  Spleen: Unremarkable.  Adrenals/Urinary Tract: Low-attenuation lesions in both kidneys, compatible with simple cysts, largest of which is in the interpolar region of the left kidney where there is a 2.4 cm low-attenuation peripelvic cyst. No suspicious renal lesions. No hydroureteronephrosis. Urinary bladder is largely obscured by beam hardening artifact from bilateral hip arthroplasties, but visualized portions are normal in appearance. Bilateral adrenal glands are normal in appearance.  Stomach/Bowel: Normal appearance of the stomach. No pathologic dilatation of small bowel or colon. Numerous colonic diverticulae are noted, particularly in the sigmoid colon, without surrounding inflammatory changes to suggest an acute diverticulitis at this time. The  appendix is not confidently identified and may be surgically absent. Regardless, there are no inflammatory changes noted adjacent to the cecum to suggest the presence of an acute appendicitis at this time.  Vascular/Lymphatic: Aortic atherosclerosis, with vascular findings and measurements pertinent to potential TAVR procedure, as detailed below. No aneurysm or dissection noted in the abdominal or pelvic vasculature. No lymphadenopathy noted in the abdomen or pelvis.  Reproductive: Uterus and ovaries are partially obscured by beam hardening artifact from the patient's bilateral hip arthroplasties, but are generally unremarkable in appearance.  Other: No significant volume of ascites. No pneumoperitoneum.  Musculoskeletal: Postprocedural changes of sacroplasty are noted bilaterally. Old healed fractures of the left superior and inferior pubic rami. Status post bilateral hip arthroplasty. There are no aggressive appearing lytic or blastic lesions noted in the visualized portions of the skeleton.  VASCULAR MEASUREMENTS PERTINENT TO TAVR:  AORTA:  Minimal Aortic Diameter-1.4 x 1.1 mm  Severity of Aortic Calcification-moderate to severe  RIGHT PELVIS:  Right Common Iliac Artery -  Minimal Diameter-7.5 x 6.3 mm  Tortuosity-mild  Calcification-moderate  Right External Iliac Artery -  Minimal Diameter-5.7 x 5.3 mm  Tortuosity - mild  Calcification-mild  Right Common Femoral Artery -  Minimal Diameter-6.0 x 6.1 mm  Tortuosity - mild  Calcification-none  LEFT PELVIS:  Left Common Iliac Artery -  Minimal Diameter-6.2 x 6.6 mm  Tortuosity - mild  Calcification-moderate  Left External Iliac Artery -  Minimal Diameter-6.1 x 6.3 mm  Tortuosity - mild  Calcification-none  Left Common Femoral Artery -  Minimal Diameter-5.7 x 4.9 mm  Tortuosity - mild  Calcification-mild  Review of the MIP images confirms the above  findings.  IMPRESSION: 1. Vascular findings and measurements pertinent to potential TAVR procedure, as detailed above. 2. Severe thickening calcification of the aortic valve, compatible with  the reported clinical history of severe aortic stenosis. 3. Ectasia of the ascending thoracic aorta (4.0 cm in diameter). Recommend annual imaging followup by CTA or MRA. This recommendation follows 2010 ACCF/AHA/AATS/ACR/ASA/SCA/SCAI/SIR/STS/SVM Guidelines for the Diagnosis and Management of Patients with Thoracic Aortic Disease. Circulation. 2010; 121: J009-F818. Aortic aneurysm NOS (ICD10-I71.9). 4. Multiple small pulmonary nodules measuring 4 mm or less in size scattered throughout the lungs bilaterally. These are nonspecific, but statistically likely benign. No follow-up needed if patient is low-risk (and has no known or suspected primary neoplasm). Non-contrast chest CT can be considered in 12 months if patient is high-risk. This recommendation follows the consensus statement: Guidelines for Management of Incidental Pulmonary Nodules Detected on CT Images: From the Fleischner Society 2017; Radiology 2017; 284:228-243. 5. Colonic diverticulosis without evidence of acute diverticulitis at this time. 6. Additional incidental findings, as above.   Electronically Signed By: Vinnie Langton M.D. On: 12/10/2018 09:28   STS Risk Calculator: Procedure: Isolated AVR Risk of Mortality:  2.051% Renal Failure:  0.980% Permanent Stroke:  1.266% Prolonged Ventilation:  5.875% DSW Infection:  0.066% Reoperation:  2.971% Morbidity or Mortality:  9.316% Short Length of Stay:  34.539% Long Length of Stay:  4.945%  Impression:  This 78 year old woman has stage D, severe, symptomatic aortic stenosis with New York Heart Association class III symptoms of exertional fatigue and shortness of breath consistent with chronic diastolic congestive heart failure.  Her symptoms seem to  have worsened over the past few weeks.  I have personally reviewed her 2D echocardiogram, cardiac catheterization, and CTA images.  Her echocardiogram shows a trileaflet aortic valve with moderate thickening and calcification of the leaflets with restricted mobility.  The mean transvalvular gradient has increased to 39 mmHg consistent with severe aortic stenosis.  Left ventricular function is normal.  Cardiac catheterization shows normal coronary arteries.  The mean gradient measured across aortic valve was only 17 mmHg which is not consistent with the echocardiogram data but her valve looks like a severely stenotic valve.  Right heart pressures are normal.  I agree that aortic valve replacement is indicated in this patient with severe aortic stenosis and progressive symptoms.  Her operative risk for open surgical aortic valve replacement would be relatively low although given her small annulus size I think she would probably require aortic root replacement with a porcine root to have an adequate sized valve with a low gradient.  This would give her a higher operative risk.  She is 78 years old with severe obstructive lung disease and severe reduction in diffusion capacity on PFTs.  She has also had bilateral hip replacements.  She would rather have a transcatheter aortic valve replacement if possible and I agree that it is probably the best option for her.  Her gated cardiac CTA shows a functionally bicuspid aortic valve with an annular area of 330 mm which is relatively small and only suitable for a 20 mm Sapien 3 valve.  Her annular dimensions appear to be adequate for a 26 mm Medtronic Evolut valve.  Her abdominal and pelvic CTA shows pelvic arterial anatomy that is suitable for transfemoral insertion using an inline sheath with the Medtronic valve.  The patient and her son and daughter-in-law were counseled at length regarding treatment alternatives for management of severe symptomatic aortic stenosis. The  risks and benefits of surgical intervention has been discussed in detail. Long-term prognosis with medical therapy was discussed. Alternative approaches such as conventional surgical aortic valve replacement, transcatheter aortic valve replacement, and palliative medical  therapy were compared and contrasted at length. This discussion was placed in the context of the patient's own specific clinical presentation and past medical history. All of their questions have been addressed.   Following the decision to proceed with transcatheter aortic valve replacement, a discussion was held regarding what types of management strategies would be attempted intraoperatively in the event of life-threatening complications, including whether or not the patient would be considered a candidate for the use of cardiopulmonary bypass and/or conversion to open sternotomy for attempted surgical intervention. The patient is aware of the fact that transient use of cardiopulmonary bypass may be necessary.  I think she would certainly be a candidate for sternotomy if needed to address any intraoperative emergencies.  The patient has been advised of a variety of complications that might develop including but not limited to risks of death, stroke, paravalvular leak, aortic dissection or other major vascular complications, aortic annulus rupture, device embolization, cardiac rupture or perforation, mitral regurgitation, acute myocardial infarction, arrhythmia, heart block or bradycardia requiring permanent pacemaker placement, congestive heart failure, respiratory failure, renal failure, pneumonia, infection, other late complications related to structural valve deterioration or migration, or other complications that might ultimately cause a temporary or permanent loss of functional independence or other long term morbidity. The patient provides full informed consent for the procedure as described and all questions were answered.     Plan:  Transcatheter aortic valve replacement using a Medtronic Evolut valve.  Gaye Pollack, MD

## 2018-12-30 ENCOUNTER — Inpatient Hospital Stay (HOSPITAL_COMMUNITY): Payer: Medicare Other | Admitting: Physician Assistant

## 2018-12-30 ENCOUNTER — Ambulatory Visit (HOSPITAL_COMMUNITY): Payer: Medicare Other

## 2018-12-30 ENCOUNTER — Inpatient Hospital Stay (HOSPITAL_COMMUNITY)
Admission: RE | Admit: 2018-12-30 | Discharge: 2019-01-01 | DRG: 266 | Disposition: A | Discharge status: Home Health | Payer: Medicare Other | Attending: Cardiovascular Disease | Admitting: Cardiovascular Disease

## 2018-12-30 ENCOUNTER — Inpatient Hospital Stay (HOSPITAL_COMMUNITY): Payer: Medicare Other

## 2018-12-30 ENCOUNTER — Encounter (HOSPITAL_COMMUNITY)
Admission: RE | Disposition: A | Discharge status: Home Health | Payer: Self-pay | Source: Home / Self Care | Attending: Cardiovascular Disease

## 2018-12-30 ENCOUNTER — Other Ambulatory Visit: Payer: Self-pay | Admitting: Physician Assistant

## 2018-12-30 ENCOUNTER — Other Ambulatory Visit: Payer: Self-pay

## 2018-12-30 ENCOUNTER — Encounter (HOSPITAL_COMMUNITY): Payer: Self-pay | Admitting: *Deleted

## 2018-12-30 DIAGNOSIS — I712 Thoracic aortic aneurysm, without rupture: Secondary | ICD-10-CM

## 2018-12-30 DIAGNOSIS — I272 Pulmonary hypertension, unspecified: Secondary | ICD-10-CM | POA: Diagnosis present

## 2018-12-30 DIAGNOSIS — I7777 Dissection of artery of lower extremity: Secondary | ICD-10-CM | POA: Diagnosis not present

## 2018-12-30 DIAGNOSIS — I10 Essential (primary) hypertension: Secondary | ICD-10-CM | POA: Diagnosis present

## 2018-12-30 DIAGNOSIS — Q231 Congenital insufficiency of aortic valve: Secondary | ICD-10-CM | POA: Diagnosis not present

## 2018-12-30 DIAGNOSIS — Z96643 Presence of artificial hip joint, bilateral: Secondary | ICD-10-CM | POA: Diagnosis present

## 2018-12-30 DIAGNOSIS — J441 Chronic obstructive pulmonary disease with (acute) exacerbation: Secondary | ICD-10-CM | POA: Diagnosis present

## 2018-12-30 DIAGNOSIS — Z87440 Personal history of urinary (tract) infections: Secondary | ICD-10-CM

## 2018-12-30 DIAGNOSIS — I350 Nonrheumatic aortic (valve) stenosis: Secondary | ICD-10-CM

## 2018-12-30 DIAGNOSIS — I9789 Other postprocedural complications and disorders of the circulatory system, not elsewhere classified: Secondary | ICD-10-CM | POA: Diagnosis not present

## 2018-12-30 DIAGNOSIS — I7121 Aneurysm of the ascending aorta, without rupture: Secondary | ICD-10-CM

## 2018-12-30 DIAGNOSIS — Z87891 Personal history of nicotine dependence: Secondary | ICD-10-CM

## 2018-12-30 DIAGNOSIS — Z9851 Tubal ligation status: Secondary | ICD-10-CM

## 2018-12-30 DIAGNOSIS — R29818 Other symptoms and signs involving the nervous system: Secondary | ICD-10-CM | POA: Diagnosis not present

## 2018-12-30 DIAGNOSIS — Z79899 Other long term (current) drug therapy: Secondary | ICD-10-CM | POA: Diagnosis not present

## 2018-12-30 DIAGNOSIS — J9811 Atelectasis: Secondary | ICD-10-CM | POA: Diagnosis not present

## 2018-12-30 DIAGNOSIS — I110 Hypertensive heart disease with heart failure: Secondary | ICD-10-CM | POA: Diagnosis present

## 2018-12-30 DIAGNOSIS — Z801 Family history of malignant neoplasm of trachea, bronchus and lung: Secondary | ICD-10-CM

## 2018-12-30 DIAGNOSIS — I2510 Atherosclerotic heart disease of native coronary artery without angina pectoris: Secondary | ICD-10-CM | POA: Diagnosis not present

## 2018-12-30 DIAGNOSIS — J439 Emphysema, unspecified: Secondary | ICD-10-CM | POA: Diagnosis present

## 2018-12-30 DIAGNOSIS — Z7982 Long term (current) use of aspirin: Secondary | ICD-10-CM | POA: Diagnosis not present

## 2018-12-30 DIAGNOSIS — Z952 Presence of prosthetic heart valve: Secondary | ICD-10-CM

## 2018-12-30 DIAGNOSIS — I998 Other disorder of circulatory system: Secondary | ICD-10-CM

## 2018-12-30 DIAGNOSIS — E785 Hyperlipidemia, unspecified: Secondary | ICD-10-CM | POA: Diagnosis present

## 2018-12-30 DIAGNOSIS — Z803 Family history of malignant neoplasm of breast: Secondary | ICD-10-CM | POA: Diagnosis not present

## 2018-12-30 DIAGNOSIS — Z9849 Cataract extraction status, unspecified eye: Secondary | ICD-10-CM

## 2018-12-30 DIAGNOSIS — J449 Chronic obstructive pulmonary disease, unspecified: Secondary | ICD-10-CM | POA: Diagnosis present

## 2018-12-30 DIAGNOSIS — I361 Nonrheumatic tricuspid (valve) insufficiency: Secondary | ICD-10-CM | POA: Diagnosis not present

## 2018-12-30 DIAGNOSIS — D649 Anemia, unspecified: Secondary | ICD-10-CM | POA: Diagnosis not present

## 2018-12-30 DIAGNOSIS — R918 Other nonspecific abnormal finding of lung field: Secondary | ICD-10-CM | POA: Diagnosis present

## 2018-12-30 DIAGNOSIS — Z006 Encounter for examination for normal comparison and control in clinical research program: Secondary | ICD-10-CM | POA: Diagnosis not present

## 2018-12-30 DIAGNOSIS — I5032 Chronic diastolic (congestive) heart failure: Secondary | ICD-10-CM | POA: Diagnosis present

## 2018-12-30 DIAGNOSIS — I70202 Unspecified atherosclerosis of native arteries of extremities, left leg: Secondary | ICD-10-CM | POA: Diagnosis not present

## 2018-12-30 DIAGNOSIS — M79609 Pain in unspecified limb: Secondary | ICD-10-CM | POA: Diagnosis not present

## 2018-12-30 DIAGNOSIS — R209 Unspecified disturbances of skin sensation: Secondary | ICD-10-CM | POA: Diagnosis not present

## 2018-12-30 HISTORY — DX: Presence of prosthetic heart valve: Z95.2

## 2018-12-30 HISTORY — DX: Thoracic aortic aneurysm, without rupture: I71.2

## 2018-12-30 HISTORY — DX: Other nonspecific abnormal finding of lung field: R91.8

## 2018-12-30 HISTORY — DX: Aneurysm of the ascending aorta, without rupture: I71.21

## 2018-12-30 HISTORY — PX: TEE WITHOUT CARDIOVERSION: SHX5443

## 2018-12-30 HISTORY — PX: TRANSCATHETER AORTIC VALVE REPLACEMENT, TRANSFEMORAL: SHX6400

## 2018-12-30 LAB — POCT I-STAT 4, (NA,K, GLUC, HGB,HCT)
Glucose, Bld: 109 mg/dL — ABNORMAL HIGH (ref 70–99)
Glucose, Bld: 131 mg/dL — ABNORMAL HIGH (ref 70–99)
HCT: 31 % — ABNORMAL LOW (ref 36.0–46.0)
HCT: 32 % — ABNORMAL LOW (ref 36.0–46.0)
Hemoglobin: 10.5 g/dL — ABNORMAL LOW (ref 12.0–15.0)
Hemoglobin: 10.9 g/dL — ABNORMAL LOW (ref 12.0–15.0)
Potassium: 3.7 mmol/L (ref 3.5–5.1)
Potassium: 4 mmol/L (ref 3.5–5.1)
Sodium: 140 mmol/L (ref 135–145)
Sodium: 140 mmol/L (ref 135–145)

## 2018-12-30 LAB — POCT I-STAT 7, (LYTES, BLD GAS, ICA,H+H)
Acid-base deficit: 2 mmol/L (ref 0.0–2.0)
Bicarbonate: 24.7 mmol/L (ref 20.0–28.0)
Calcium, Ion: 1.28 mmol/L (ref 1.15–1.40)
HCT: 33 % — ABNORMAL LOW (ref 36.0–46.0)
Hemoglobin: 11.2 g/dL — ABNORMAL LOW (ref 12.0–15.0)
O2 Saturation: 89 %
PO2 ART: 63 mmHg — AB (ref 83.0–108.0)
Potassium: 4 mmol/L (ref 3.5–5.1)
Sodium: 141 mmol/L (ref 135–145)
TCO2: 26 mmol/L (ref 22–32)
pCO2 arterial: 48 mmHg (ref 32.0–48.0)
pH, Arterial: 7.32 — ABNORMAL LOW (ref 7.350–7.450)

## 2018-12-30 LAB — POCT ACTIVATED CLOTTING TIME
Activated Clotting Time: 131 s
Activated Clotting Time: 263 s
Activated Clotting Time: 114 s

## 2018-12-30 LAB — POCT I-STAT CREATININE: Creatinine, Ser: 0.6 mg/dL (ref 0.44–1.00)

## 2018-12-30 SURGERY — IMPLANTATION, AORTIC VALVE, TRANSCATHETER, FEMORAL APPROACH
Anesthesia: Monitor Anesthesia Care

## 2018-12-30 MED ORDER — METOPROLOL TARTRATE 5 MG/5ML IV SOLN: 2.5000 mg | INTRAVENOUS | Status: DC | PRN

## 2018-12-30 MED ORDER — PROTAMINE SULFATE 10 MG/ML IV SOLN
INTRAVENOUS | Status: DC | PRN
  Administered 2018-12-30: 110 mg via INTRAVENOUS

## 2018-12-30 MED ORDER — SODIUM CHLORIDE 0.9% FLUSH
3.0000 mL | Freq: Two times a day (BID) | INTRAVENOUS | Status: DC
  Administered 2018-12-31: 3 mL via INTRAVENOUS

## 2018-12-30 MED ORDER — MORPHINE SULFATE (PF) 2 MG/ML IV SOLN: 1.0000 mg | INTRAVENOUS | Status: DC | PRN

## 2018-12-30 MED ORDER — HEPARIN (PORCINE) IN NACL 1000-0.9 UT/500ML-% IV SOLN
INTRAVENOUS | Status: AC
  Filled 2018-12-30: qty 500

## 2018-12-30 MED ORDER — PHENYLEPHRINE 40 MCG/ML (10ML) SYRINGE FOR IV PUSH (FOR BLOOD PRESSURE SUPPORT)
PREFILLED_SYRINGE | INTRAVENOUS | Status: DC | PRN
  Administered 2018-12-30: 40 ug via INTRAVENOUS
  Administered 2018-12-30: 80 ug via INTRAVENOUS

## 2018-12-30 MED ORDER — FENTANYL CITRATE (PF) 100 MCG/2ML IJ SOLN
INTRAMUSCULAR | Status: AC
  Administered 2018-12-30: 100 ug via INTRAVENOUS
  Filled 2018-12-30: qty 2

## 2018-12-30 MED ORDER — SODIUM CHLORIDE 0.9 % IV BOLUS
500.0000 mL | Freq: Once | INTRAVENOUS | Status: AC
  Administered 2018-12-30: 500 mL via INTRAVENOUS

## 2018-12-30 MED ORDER — IOPAMIDOL (ISOVUE-370) INJECTION 76%
INTRAVENOUS | Status: AC
  Filled 2018-12-30: qty 125

## 2018-12-30 MED ORDER — FENTANYL CITRATE (PF) 100 MCG/2ML IJ SOLN
100.0000 ug | Freq: Once | INTRAMUSCULAR | Status: AC
  Administered 2018-12-30: 100 ug via INTRAVENOUS

## 2018-12-30 MED ORDER — SODIUM CHLORIDE 0.9% FLUSH: 3.0000 mL | INTRAVENOUS | Status: DC | PRN

## 2018-12-30 MED ORDER — HEPARIN (PORCINE) IN NACL 1000-0.9 UT/500ML-% IV SOLN
INTRAVENOUS | Status: DC | PRN
  Administered 2018-12-30 (×2): 500 mL

## 2018-12-30 MED ORDER — CLOPIDOGREL BISULFATE 75 MG PO TABS
75.0000 mg | ORAL_TABLET | Freq: Every day | ORAL | Status: DC
  Administered 2018-12-31 – 2019-01-01 (×2): 75 mg via ORAL
  Filled 2018-12-30 (×2): qty 1

## 2018-12-30 MED ORDER — IOHEXOL 350 MG/ML SOLN: INTRAVENOUS | Status: DC | PRN

## 2018-12-30 MED ORDER — ONDANSETRON HCL 4 MG/2ML IJ SOLN
INTRAMUSCULAR | Status: DC | PRN
  Administered 2018-12-30: 4 mg via INTRAVENOUS

## 2018-12-30 MED ORDER — HEPARIN SODIUM (PORCINE) 1000 UNIT/ML IJ SOLN
INTRAMUSCULAR | Status: DC | PRN
  Administered 2018-12-30: 11000 [IU] via INTRAVENOUS

## 2018-12-30 MED ORDER — PHENYLEPHRINE HCL-NACL 20-0.9 MG/250ML-% IV SOLN
0.0000 ug/min | INTRAVENOUS | Status: DC
  Filled 2018-12-30: qty 250

## 2018-12-30 MED ORDER — LACTATED RINGERS IV SOLN
INTRAVENOUS | Status: DC | PRN
  Administered 2018-12-30: 09:00:00 via INTRAVENOUS

## 2018-12-30 MED ORDER — NOREPINEPHRINE BITARTRATE 1 MG/ML IV SOLN: INTRAVENOUS | Status: DC | PRN

## 2018-12-30 MED ORDER — MIDAZOLAM HCL 2 MG/2ML IJ SOLN
INTRAMUSCULAR | Status: AC
  Administered 2018-12-30: 1 mg via INTRAVENOUS
  Filled 2018-12-30: qty 2

## 2018-12-30 MED ORDER — ROSUVASTATIN CALCIUM 20 MG PO TABS
40.0000 mg | ORAL_TABLET | Freq: Every day | ORAL | Status: DC
  Administered 2018-12-30: 40 mg via ORAL
  Filled 2018-12-30: qty 2

## 2018-12-30 MED ORDER — TRAMADOL HCL 50 MG PO TABS
50.0000 mg | ORAL_TABLET | ORAL | Status: DC | PRN
  Administered 2018-12-31 – 2019-01-01 (×3): 100 mg via ORAL
  Filled 2018-12-30 (×3): qty 2

## 2018-12-30 MED ORDER — SODIUM CHLORIDE 0.9 % IV SOLN
INTRAVENOUS | Status: AC
  Administered 2018-12-30: 14:00:00 via INTRAVENOUS

## 2018-12-30 MED ORDER — SODIUM CHLORIDE 0.9 % IV SOLN
INTRAVENOUS | Status: DC
  Administered 2018-12-30: 09:00:00 via INTRAVENOUS

## 2018-12-30 MED ORDER — VANCOMYCIN HCL IN DEXTROSE 1-5 GM/200ML-% IV SOLN
1000.0000 mg | Freq: Once | INTRAVENOUS | Status: AC
  Administered 2018-12-30: 1000 mg via INTRAVENOUS
  Filled 2018-12-30: qty 200

## 2018-12-30 MED ORDER — MIDAZOLAM HCL 5 MG/5ML IJ SOLN
INTRAMUSCULAR | Status: DC | PRN
  Administered 2018-12-30: 1 mg via INTRAVENOUS

## 2018-12-30 MED ORDER — CHLORHEXIDINE GLUCONATE 4 % EX LIQD: 30.0000 mL | CUTANEOUS | Status: DC

## 2018-12-30 MED ORDER — CHLORHEXIDINE GLUCONATE 4 % EX LIQD
60.0000 mL | Freq: Once | CUTANEOUS | Status: DC
  Filled 2018-12-30: qty 60

## 2018-12-30 MED ORDER — LIDOCAINE HCL (PF) 1 % IJ SOLN
INTRAMUSCULAR | Status: DC | PRN
  Administered 2018-12-30: 10 mL

## 2018-12-30 MED ORDER — DEXAMETHASONE SODIUM PHOSPHATE 10 MG/ML IJ SOLN
INTRAMUSCULAR | Status: DC | PRN
  Administered 2018-12-30: 4 mg via INTRAVENOUS

## 2018-12-30 MED ORDER — MIDAZOLAM HCL 2 MG/2ML IJ SOLN
1.0000 mg | Freq: Once | INTRAMUSCULAR | Status: AC
  Administered 2018-12-30: 1 mg via INTRAVENOUS

## 2018-12-30 MED ORDER — ACETAMINOPHEN 325 MG PO TABS
650.0000 mg | ORAL_TABLET | Freq: Four times a day (QID) | ORAL | Status: DC | PRN
  Administered 2018-12-30 – 2019-01-01 (×4): 650 mg via ORAL
  Filled 2018-12-30 (×4): qty 2

## 2018-12-30 MED ORDER — ACETAMINOPHEN 650 MG RE SUPP: 650.0000 mg | Freq: Four times a day (QID) | RECTAL | Status: DC | PRN

## 2018-12-30 MED ORDER — NITROGLYCERIN IN D5W 200-5 MCG/ML-% IV SOLN: 0.0000 ug/min | INTRAVENOUS | Status: DC

## 2018-12-30 MED ORDER — MUPIROCIN 2 % EX OINT
1.0000 "application " | TOPICAL_OINTMENT | Freq: Two times a day (BID) | CUTANEOUS | Status: AC
  Administered 2018-12-30 – 2018-12-31 (×2): 1 via NASAL

## 2018-12-30 MED ORDER — LIDOCAINE HCL (PF) 1 % IJ SOLN
INTRAMUSCULAR | Status: AC
  Filled 2018-12-30: qty 30

## 2018-12-30 MED ORDER — ONDANSETRON HCL 4 MG/2ML IJ SOLN: 4.0000 mg | Freq: Four times a day (QID) | INTRAMUSCULAR | Status: DC | PRN

## 2018-12-30 MED ORDER — CHLORHEXIDINE GLUCONATE 0.12 % MT SOLN
15.0000 mL | Freq: Once | OROMUCOSAL | Status: AC
  Administered 2018-12-30: 15 mL via OROMUCOSAL
  Filled 2018-12-30 (×2): qty 15

## 2018-12-30 MED ORDER — SODIUM CHLORIDE 0.9 % IV SOLN: 250.0000 mL | INTRAVENOUS | Status: DC | PRN

## 2018-12-30 MED ORDER — PROPOFOL 500 MG/50ML IV EMUL
INTRAVENOUS | Status: DC | PRN
  Administered 2018-12-30: 30 ug/kg/min via INTRAVENOUS

## 2018-12-30 MED ORDER — SODIUM CHLORIDE 0.9 % IV SOLN
1.5000 g | Freq: Two times a day (BID) | INTRAVENOUS | Status: AC
  Administered 2018-12-30 – 2018-12-31 (×3): 1.5 g via INTRAVENOUS
  Filled 2018-12-30 (×5): qty 1.5

## 2018-12-30 MED ORDER — OXYCODONE HCL 5 MG PO TABS: 5.0000 mg | ORAL_TABLET | ORAL | Status: DC | PRN

## 2018-12-30 MED ORDER — CHLORHEXIDINE GLUCONATE CLOTH 2 % EX PADS
6.0000 | MEDICATED_PAD | Freq: Every day | CUTANEOUS | Status: AC
  Administered 2018-12-31: 6 via TOPICAL

## 2018-12-30 MED ORDER — ASPIRIN EC 81 MG PO TBEC
81.0000 mg | DELAYED_RELEASE_TABLET | Freq: Every day | ORAL | Status: DC
  Administered 2018-12-30 – 2019-01-01 (×3): 81 mg via ORAL
  Filled 2018-12-30 (×3): qty 1

## 2018-12-30 MED ORDER — ACETAMINOPHEN 500 MG PO TABS
1000.0000 mg | ORAL_TABLET | Freq: Once | ORAL | Status: AC
  Administered 2018-12-30: 1000 mg via ORAL
  Filled 2018-12-30: qty 2

## 2018-12-30 MED ORDER — IOPAMIDOL (ISOVUE-370) INJECTION 76%
INTRAVENOUS | Status: DC | PRN
  Administered 2018-12-30: 30 mL via INTRA_ARTERIAL

## 2018-12-30 MED ORDER — HEPARIN (PORCINE) IN NACL 1000-0.9 UT/500ML-% IV SOLN
INTRAVENOUS | Status: DC | PRN
  Administered 2018-12-30: 500 mL

## 2018-12-30 MED ORDER — SODIUM CHLORIDE 0.9 % IV SOLN
INTRAVENOUS | Status: DC | PRN
  Administered 2018-12-30: 15 ug/min via INTRAVENOUS

## 2018-12-30 MED ORDER — ACETAMINOPHEN 500 MG PO TABS
ORAL_TABLET | ORAL | Status: AC
  Administered 2018-12-30: 1000 mg via ORAL
  Filled 2018-12-30: qty 2

## 2018-12-30 SURGICAL SUPPLY — 42 items
BAG SNAP BAND KOVER 36X36 (MISCELLANEOUS) ×4 IMPLANT
BALLN TRUE 18X4.5 (BALLOONS) ×2
BALLN TRUE 18X4.5CM (BALLOONS) ×1
BALLOON TRUE 18X4.5 (BALLOONS) IMPLANT
BLANKET WARM UNDERBOD FULL ACC (MISCELLANEOUS) ×2 IMPLANT
CABLE ADAPT PACING TEMP 12FT (ADAPTER) ×2 IMPLANT
CATH DIAG 6FR PIGTAIL (CATHETERS) ×2 IMPLANT
CATH DIAG 6FR PIGTAIL ANGLED (CATHETERS) ×2 IMPLANT
CATH INFINITI 6F AL1 (CATHETERS) ×2 IMPLANT
CATH S G BIP PACING (CATHETERS) ×2 IMPLANT
CLOSURE MYNX CONTROL 6F/7F (Vascular Products) ×2 IMPLANT
DERMABOND ADVANCED (GAUZE/BANDAGES/DRESSINGS) ×2
DERMABOND ADVANCED .7 DNX12 (GAUZE/BANDAGES/DRESSINGS) IMPLANT
DEVICE CLOSURE PERCLS PRGLD 6F (VASCULAR PRODUCTS) IMPLANT
ELECT DEFIB PAD ADLT CADENCE (PAD) ×2 IMPLANT
GUIDEWIRE CNFDA BRKR CVD (WIRE) ×2 IMPLANT
KIT DILATOR VASC 18G NDL (KITS) ×2 IMPLANT
KIT HEART LEFT (KITS) ×3 IMPLANT
KIT MICROPUNCTURE NIT STIFF (SHEATH) ×2 IMPLANT
PACK CARDIAC CATHETERIZATION (CUSTOM PROCEDURE TRAY) ×2 IMPLANT
PERCLOSE PROGLIDE 6F (VASCULAR PRODUCTS) ×9
SHEATH BRITE TIP 6FR 35CM (SHEATH) ×2 IMPLANT
SHEATH PINNACLE 6F 10CM (SHEATH) ×2 IMPLANT
SHEATH PINNACLE 8F 10CM (SHEATH) ×2 IMPLANT
SHEATH PROBE COVER 6X72 (BAG) ×2 IMPLANT
SLEEVE REPOSITIONING LENGTH 30 (MISCELLANEOUS) ×2 IMPLANT
STOPCOCK MORSE 400PSI 3WAY (MISCELLANEOUS) ×4 IMPLANT
SYR MEDRAD MARK V 150ML (SYRINGE) ×2 IMPLANT
SYS DEL EVOLUT PROPLS 23 26 29 (CATHETERS) ×3
SYS LOAD EVOLT PROPLS 23 26 29 (CATHETERS) ×3
SYSTEM DEL EVLT PRPLS 23 26 29 (CATHETERS) IMPLANT
SYSTEM LOAD EVLT PRPLS23 26 29 (CATHETERS) IMPLANT
TRANSDUCER W/STOPCOCK (MISCELLANEOUS) ×5 IMPLANT
TUBE CONN 8.8X1320 FR HP M-F (CONNECTOR) ×2 IMPLANT
TUBING ART PRESS 72  MALE/FEM (TUBING) ×2
TUBING ART PRESS 72 MALE/FEM (TUBING) IMPLANT
TUBING CIL FLEX 10 FLL-RA (TUBING) ×2 IMPLANT
VALVE AORTIC EVOLUT PROPLUS 26 (Valve) ×2 IMPLANT
WIRE AMPLATZ SS-J .035X180CM (WIRE) ×2 IMPLANT
WIRE EMERALD 3MM-J .035X150CM (WIRE) ×2 IMPLANT
WIRE EMERALD 3MM-J .035X260CM (WIRE) ×2 IMPLANT
WIRE EMERALD ST .035X260CM (WIRE) ×2 IMPLANT

## 2018-12-30 NOTE — Progress Notes (Signed)
  Echocardiogram 2D Echocardiogram has been performed.  Dominique [name] 12/30/2018, 10:55 AM

## 2018-12-30 NOTE — Progress Notes (Signed)
  [place] VALVE TEAM  Patient doing well s/p TAVR. She is hemodynamically stable but still on 1 mcg Levophed. Plan to give 500 cc bolus NS. Groin sites stable. ECG with no high grade block. When off pressors plan to DC arterial line and transfer to 4E. Plan for early ambulation after bedrest completed and hopeful discharge over the next 24-48 hours.   Angelena Form PA-C  [facility]  Pager [phone]

## 2018-12-30 NOTE — Anesthesia Postprocedure Evaluation (Signed)
Anesthesia Post Note  Patient: Dominique [name]  Procedure(s) Performed: TRANSCATHETER AORTIC VALVE REPLACEMENT, TRANSFEMORAL (N/A ) TRANSESOPHAGEAL ECHOCARDIOGRAM (TEE) (N/A )     Patient location during evaluation: Cath Lab Anesthesia Type: MAC Level of consciousness: awake and alert Pain management: pain level controlled Vital Signs Assessment: post-procedure vital signs reviewed and stable Respiratory status: spontaneous breathing, nonlabored ventilation, respiratory function stable and patient connected to nasal cannula oxygen Cardiovascular status: stable and blood pressure returned to baseline Postop Assessment: no apparent nausea or vomiting Anesthetic complications: no    Last Vitals:  Vitals:   12/30/18 1320 12/30/18 1400  BP: (!) 78/57 110/74  Pulse: 72 73  Resp:  16  Temp:    SpO2: 97% 100%    Last Pain:  Vitals:   12/30/18 0738  TempSrc:   PainSc: 2                  Trena Dunavan P Rihan Schueler

## 2018-12-30 NOTE — Progress Notes (Signed)
   12/30/18 1320  Vitals  BP (!) 78/57  MAP (mmHg) 66  Pulse Rate 72  Oxygen Therapy  SpO2 97 %  MEWS Score  MEWS RR 2  MEWS Pulse 0  MEWS Systolic 2  MEWS LOC 0  MEWS Temp 0  MEWS Score 4  MEWS Score Color Red  Patient arrived to the unit from PACU. PACU nurse initiated 527ml  NS bolus for above BP. Patient AAOx4  Oriented to surroundings. Personal belongings with family in waiting room. Pure Wick in use.  Placed on the tele monitor. Informed of bedrest until 0300. Bil groin sites level 0.

## 2018-12-30 NOTE — Transfer of Care (Signed)
Immediate Anesthesia Transfer of Care Note  Patient: Dominique [name]  Procedure(s) Performed: TRANSCATHETER AORTIC VALVE REPLACEMENT, TRANSFEMORAL (N/A ) TRANSESOPHAGEAL ECHOCARDIOGRAM (TEE) (N/A )  Patient Location: Cath Lab  Anesthesia Type:MAC  Level of Consciousness: awake, alert  and oriented  Airway & Oxygen Therapy: Patient Spontanous Breathing and Patient connected to nasal cannula oxygen  Post-op Assessment: Report given to RN and Post -op Vital signs reviewed and stable  Post vital signs: Reviewed and stable  Last Vitals:  Vitals Value Taken Time  BP    Temp    Pulse 71 12/30/2018 11:23 AM  Resp 17 12/30/2018 11:23 AM  SpO2 95 % 12/30/2018 11:23 AM  Vitals shown include unvalidated device data.  Last Pain:  Vitals:   12/30/18 0738  TempSrc:   PainSc: 2          Complications: No apparent anesthesia complications

## 2018-12-30 NOTE — Anesthesia Procedure Notes (Signed)
Procedure Name: MAC Date/Time: 12/30/2018 10:11 AM Performed by: Teressa Lower., CRNA Pre-anesthesia Checklist: Patient identified, Emergency Drugs available, Suction available, Patient being monitored and Timeout performed Patient Re-evaluated:Patient Re-evaluated prior to induction Oxygen Delivery Method: Simple face mask Ventilation: Oral airway inserted - appropriate to patient size

## 2018-12-30 NOTE — Interval H&P Note (Signed)
History and Physical Interval Note:  12/30/2018 8:05 AM  Dominique [name]  has presented today for surgery, with the diagnosis of Severe Aortic Stenosis  The various methods of treatment have been discussed with the patient and family. After consideration of risks, benefits and other options for treatment, the patient has consented to  Procedure(s): TRANSCATHETER AORTIC VALVE REPLACEMENT, TRANSFEMORAL (N/A) TRANSESOPHAGEAL ECHOCARDIOGRAM (TEE) (N/A) as a surgical intervention .  The patient's history has been reviewed, patient examined, no change in status, stable for surgery.  I have reviewed the patient's chart and labs.  Questions were answered to the patient's satisfaction.     Gaye Pollack

## 2018-12-30 NOTE — Progress Notes (Signed)
Patient up to chair after bed rest. B groins without evidence of bleeding or hematoma. Will monitor patient. Zamyia Gowell, Bettina Gavia RN

## 2018-12-30 NOTE — Op Note (Signed)
[facility]   MULTIDISCIPLINARY HEART VALVE TEAM   TAVR OPERATIVE NOTE   Date of Procedure:  12/30/2018  Preoperative Diagnosis: Severe Aortic Stenosis   Postoperative Diagnosis: Same   Procedure:    Transcatheter Aortic Valve Replacement - Percutaneous  Transfemoral Approach  Medtronic Evolut Pro-Plus THV (size 26 mm, serial # [id number])   Co-Surgeons:  Gaye Pollack, MD and Sherren Mocha, MD  Anesthesiologist:  Adele Barthel, MD  Echocardiographer:  Ena Dawley, MD  Pre-operative Echo Findings:  Severe aortic stenosis  Normal left ventricular systolic function  Post-operative Echo Findings:  No paravalvular leak  Normal left ventricular systolic function  BRIEF CLINICAL NOTE AND INDICATIONS FOR SURGERY  78 yo woman presenting for elective TAVR for treatment of severe symptomatic aortic stenosis. The patient has a history of moderate aortic stenosis and she has had serial clinical follow-up as well as echo imaging.  She is also undergone coronary CTA studies for evaluation of chest pain.  These have demonstrated nonobstructive coronary artery disease with less than 50% stenosis of the proximal LAD and minor nonobstructive disease elsewhere.  Her most recent echocardiogram demonstrated normal LV systolic function with a peak transaortic velocity of 4 m/s, mean gradient of 39 mmHg, and peak gradient of 64 mmHg.  This represented a significant increase from her previous study in August 2018 when her peak velocity was 3.1 m/s, and mean gradient was 21 mmHg.  The patient now presents with symptoms of progressive exertional dyspnea.  She has dyspnea with mild to moderate physical activity.  She states that even taking a shower wears her out.  She oftentimes has to lie down after getting out of the shower.  She denies recent chest pain or pressure.  She has chronic leg edema which is relatively mild and has been unchanged recently.  She denies lightheadedness,  syncope, orthopnea, or PND.  She had planned on a trip to [place] in the near future, but does not currently feel well enough to go.  She is a former smoker and quit 12 years ago. Carries a diagnosis of emphysema but has never had respiratory failure or been on O2. She has inhalers but doesn't use them.   During the course of the patient's preoperative work up they have been evaluated comprehensively by a multidisciplinary team of specialists coordinated through the [facility] in the [facility].  They have been demonstrated to suffer from symptomatic severe aortic stenosis as noted above. The patient has been counseled extensively as to the relative risks and benefits of all options for the treatment of severe aortic stenosis including long term medical therapy, conventional surgery for aortic valve replacement, and transcatheter aortic valve replacement.  The patient has been independently evaluated by Dr Cyndia Bent in formal cardiac surgical consultation. Based upon review of all of the patient's preoperative diagnostic tests they are felt to be candidate for transcatheter aortic valve replacement using the transfemoral approach as an alternative to high risk conventional surgery.    Following the decision to proceed with transcatheter aortic valve replacement, a discussion has been held regarding what types of management strategies would be attempted intraoperatively in the event of life-threatening complications, including whether or not the patient would be considered a candidate for the use of cardiopulmonary bypass and/or conversion to open sternotomy for attempted surgical intervention.  The patient has been advised of a variety of complications that might develop peculiar to this approach including but not limited to  risks of death, stroke, paravalvular leak, aortic dissection or other major vascular complications, aortic annulus rupture, device  embolization, cardiac rupture or perforation, acute myocardial infarction, arrhythmia, heart block or bradycardia requiring permanent pacemaker placement, congestive heart failure, respiratory failure, renal failure, pneumonia, infection, other late complications related to structural valve deterioration or migration, or other complications that might ultimately cause a temporary or permanent loss of functional independence or other long term morbidity.  The patient provides full informed consent for the procedure as described and all questions were answered preoperatively.  DETAILS OF THE OPERATIVE PROCEDURE  PREPARATION:   The patient is brought to the operating room on the above mentioned date and central monitoring was established by the anesthesia team including placement of a central venous catheter and radial arterial line. The patient is placed in the supine position on the operating table.  Intravenous antibiotics are administered. The patient is monitored closely throughout the procedure under conscious sedation.  Baseline transthoracic echocardiogram is performed. The patient's chest, abdomen, both groins, and both lower extremities are prepared and draped in a sterile manner. A time out procedure is performed.   PERIPHERAL ACCESS:   Using ultrasound guidance, femoral arterial and venous access is obtained with placement of 6 Fr sheaths on the right side.  A pigtail diagnostic catheter was passed through the femoral arterial sheath under fluoroscopic guidance into the aortic root.  A temporary transvenous pacemaker catheter was passed through the femoral venous sheath under fluoroscopic guidance into the right ventricle.  The pacemaker was tested to ensure stable lead placement and pacemaker capture.   TRANSFEMORAL ACCESS:  A micropuncture technique is used to access the left femoral artery under fluoroscopic and ultrasound guidance.  2 Perclose devices are deployed at 10' and 2' positions  to 'PreClose' the femoral artery. An 8 French sheath is placed and then an Amplatz Superstiff wire is advanced through the sheath. This is changed out for a 14 French transfemoral DrySeal sheath after progressively dilating over the Superstiff wire.  An AL-1 catheter was used to direct a straight-tip exchange length wire across the native aortic valve into the left ventricle. This was exchanged out for a pigtail catheter and position was confirmed in the LV apex. Simultaneous LV and Ao pressures were recorded.  The pigtail catheter was exchanged for a Confida wire in the LV apex.   BALLOON AORTIC VALVULOPLASTY:  Balloon aortic valvuloplasty was performed using an 18 mm valvuloplasty balloon.  Once optimal position was achieved, BAV was done under rapid ventricular pacing. The patient recovered well hemodynamically.   TRANSCATHETER HEART VALVE DEPLOYMENT:  A Medtronic Evolut Pro-Plus transcatheter heart valve (size 26 mm, serial #[id number]) was prepared and crimped per manufacturer's guidelines, and the proper orientation of the valve is confirmed on the  delivery system. The 14 Fr sheath is removed and caution is taken to maintain proper wire position in the LV. The Medtronic valve is then introduced via the inline sheath without too much difficulty. The valve is tracked around the aortic arch and across the aortic valve annulus. The parallax is taken out of the system by moving the detector to an LAO caudal position and aortography is used to confirm proper alignment. The valve is deployed to 80% per protocol and function assessed by echo after pacing the patient at 120 bpm. The valve is then fully released, wire removed and delivery system pulled back into the descending aorta.  There is felt to be no paravalvular leak and no central aortic insufficiency.  The patient's hemodynamic recovery following valve deployment is good.  The deployment balloon and guidewire are both removed. Echo demostrated acceptable  post-procedural gradients, stable mitral valve function, and no aortic insufficiency.   PROCEDURE COMPLETION:  The sheath was removed and femoral artery closure is performed using the 2 previously deployed Perclose devices.  A 3rd perclose is deployed to achieve complete hemostasis. The contralateral femoral artery is closed with a Mynx device. Protamine is administered once femoral arterial repair was complete. The site is clear with no evidence of bleeding or hematoma after the sutures are tightened. The temporary pacemaker, pigtail catheters and femoral sheaths were removed with manual pressure used for femoral venous hemostasis.   The patient tolerated the procedure well and is transported to the surgical intensive care in stable condition. There were no immediate intraoperative complications. All sponge instrument and needle counts are verified correct at completion of the operation.   The patient received a total of 30 mL of intravenous contrast during the procedure.   Sherren Mocha, MD 12/30/2018 11:35 AM

## 2018-12-30 NOTE — Anesthesia Procedure Notes (Addendum)
Central Venous Catheter Insertion Performed by: Murvin Natal, MD, anesthesiologist Start/End3/12/2018 8:30 AM, 12/30/2018 8:45 AM Patient location: Pre-op. Preanesthetic checklist: patient identified, IV checked, site marked, risks and benefits discussed, surgical consent, monitors and equipment checked, pre-op evaluation, timeout performed and anesthesia consent Position: Trendelenburg Lidocaine 1% used for infiltration and patient sedated Hand hygiene performed , maximum sterile barriers used  and Seldinger technique used Catheter size: 8 Fr Total catheter length 16. Central line was placed.Double lumen Procedure performed using ultrasound guided technique. Ultrasound Notes:anatomy identified, needle tip was noted to be adjacent to the nerve/plexus identified, no ultrasound evidence of intravascular and/or intraneural injection and image(s) printed for medical record Attempts: 1 Following insertion, dressing applied, line sutured and Biopatch. Post procedure assessment: blood return through all ports, free fluid flow and no air  Patient tolerated the procedure well with no immediate complications.

## 2018-12-30 NOTE — Discharge Instructions (Signed)

## 2018-12-30 NOTE — Op Note (Addendum)
[facility]   MULTIDISCIPLINARY HEART VALVE TEAM   TAVR OPERATIVE NOTE    Dominique [name] [id number]   Date of Procedure:  12/30/2018  Preoperative Diagnosis: Severe Aortic Stenosis   Postoperative Diagnosis: Same   Procedure:    Transcatheter Aortic Valve Replacement - Percutaneous Left Transfemoral Approach  Medtronic CoreValve Evolut Pro + (size 26 mm,  serial # [id number])   Co-Surgeons:  Gaye Pollack, MD and Sherren Mocha, MD   Anesthesiologist:  Adele Barthel, MD  Echocardiographer:  Ena Dawley, MD  Pre-operative Echo Findings:  Severe aortic stenosis  Normal left ventricular systolic function  Post-operative Echo Findings:  No paravalvular leak  Normal left ventricular systolic function   BRIEF CLINICAL NOTE AND INDICATIONS FOR SURGERY  This 78 year old woman has stage D, severe, symptomatic aortic stenosis with New York Heart Association class III symptoms of exertional fatigue and shortness of breath consistent with chronic diastolic congestive heart failure. Her symptoms seem to have worsened over the past few weeks. I have personally reviewed her 2D echocardiogram, cardiac catheterization, and CTA images. Her echocardiogram shows a trileaflet aortic valve with moderate thickening and calcification of the leaflets with restricted mobility. The mean transvalvular gradient has increased to 39 mmHg consistent with severe aortic stenosis. Left ventricular function is normal. Cardiac catheterization shows normal coronary arteries. The mean gradient measured across aortic valve was only 17 mmHg which is not consistent with the echocardiogram data but her valve looks like a severely stenotic valve. Right heart pressures are normal. I agree that aortic valve replacement is indicated in this patient with severe aortic stenosis and progressive symptoms. Her operative risk for open surgical aortic valve replacement would be relatively low  although given her small annulus size I think she would probably require aortic root replacement with a porcine rootto have an adequate sized valve with a low gradient. This would give her a higher operative risk. She is 78 years old with severe obstructive lung disease and severe reduction in diffusion capacity on PFTs.She has also had bilateral hip replacements. She would rather have a transcatheter aortic valve replacement if possible and I agree that itis probably the best option for her. Her gated cardiac CTA shows a functionally bicuspid aortic valve with an annular area of 330 mm which is relatively small and only suitable for a 20 mm Sapien 3 valve.Her annular dimensions appear to be adequate for a 26 mm MedtronicEvolutvalve.Her abdominal and pelvic CTA shows pelvic arterial anatomy that is suitable for transfemoral insertion using an inline sheath with the Medtronic valve.  The patientand her son and daughter-in-law werecounseled at length regarding treatment alternatives for management of severe symptomatic aortic stenosis. The risks and benefits of surgical intervention has been discussed in detail. Long-term prognosis with medical therapy was discussed. Alternative approaches such as conventional surgical aortic valve replacement, transcatheter aortic valve replacement, and palliative medical therapy were compared and contrasted at length. This discussion was placed in the context of the patient's own specific clinical presentation and past medical history. All of their questions havebeen addressed.   Following the decision to proceed with transcatheter aortic valve replacement, a discussion was held regarding what types of management strategies would be attempted intraoperatively in the event of life-threatening complications, including whether or not the patient would be considered a candidate for the use of cardiopulmonary bypass and/or conversion to open sternotomy for  attempted surgical intervention. The patient is aware of the fact that transient use of cardiopulmonary bypass may be  necessary.I think she would certainly be a candidate for sternotomy if needed to address any intraoperative emergencies.  The patient has been advised of a variety of complications that might develop including but not limited to risks of death, stroke, paravalvular leak, aortic dissection or other major vascular complications, aortic annulus rupture, device embolization, cardiac rupture or perforation, mitral regurgitation, acute myocardial infarction, arrhythmia, heart block or bradycardia requiring permanent pacemaker placement, congestive heart failure, respiratory failure, renal failure, pneumonia, infection, other late complications related to structural valve deterioration or migration, or other complications that might ultimately cause a temporary or permanent loss of functional independence or other long term morbidity. The patient provides full informed consent for the procedure as described and all questions were answered.   DETAILS OF THE OPERATIVE PROCEDURE  PREPARATION:    The patient is brought to the operating room on the above mentioned date and central monitoring was established by the anesthesia team including placement of a central venous line and radial arterial line. The patient is placed in the supine position on the operating table.  Intravenous antibiotics are administered.  Conscious sedation was used.   Baseline transthoracic echocardiogram was performed. The patient's chest, abdomen, both groins, and both lower extremities are prepared and draped in a sterile manner. A time out procedure is performed.   PERIPHERAL ACCESS:    Using the modified Seldinger technique, femoral arterial and venous access was obtained with placement of 6 Fr sheaths on the right side.  A pigtail diagnostic catheter was passed through the right arterial sheath under fluoroscopic  guidance into the aortic root.  A temporary transvenous pacemaker catheter was passed through the right femoral venous sheath under fluoroscopic guidance into the right ventricle.  The pacemaker was tested to ensure stable lead placement and pacemaker capture.   TRANSFEMORAL ACCESS:   Percutaneous transfemoral access and sheath placement was performed using ultrasound guidance.  The left common femoral artery was cannulated using a micropuncture needle. A pair of Abbott Perclose percutaneous closure devices were placed and a 8 French sheath replaced into the femoral artery.  The patient was heparinized systemically and ACT verified > 250 seconds.    A 14 Fr Dry-Seal sheath was introduced into the left femoral artery after progressively dilating over an Amplatz superstiff wire. An AL-1 catheter was used to direct a straight-tip exchange length wire across the native aortic valve into the left ventricle. This was exchanged out for a pigtail catheter and position was confirmed in the LV apex. Simultaneous LV and Ao pressures were recorded.  The pigtail catheter was exchanged for a Confida wire in the LV apex.    BALLOON AORTIC VALVULOPLASTY:   Balloon aortic valvuloplasty was performed using an 18 mm valvuloplasty balloon. This was performed under rapid ventricular pacing. There was complete hemodynamic recovery.   TRANSCATHETER HEART VALVE DEPLOYMENT:   A Medtronic Evolut Pro + transcatheter heart valve (size 26 mm, serial #[id number]) was prepared and crimped per manufacturer's guidelines, and the proper orientation of the valve is confirmed on the  delivery system. The 14 Fr Dry-Seal sheath was removed. The Medtronic valve was then inserted over the wire using the in-line sheath. The valve was then advanced across the aortic arch. The valve was carefully positioned across the aortic valve annulus. The paralax was removed from the delivery system by moving the detector into the LAO postion.  The valve is  carefully deployed using the retractor dial so that 80% of the valve is released while pacing at  120 bpm. TEE demonstrated no paravalvular AI and the patient's heart rhythm was stable. The valve was fully released during pacing at 120 bpm. There was felt to be no paravalvular leak and no central aortic insufficiency. Echo showed acceptable post-procedural gradients. The patient's hemodynamic recovery following valve deployment is good.  The delivery catheter and guidewire were both removed.    PROCEDURE COMPLETION:   The sheath was removed and femoral artery closure performed using the previously placed Perclose devices. A third Perclose device was placed for hemostasis.  Protamine was administered once femoral arterial repair was complete. The temporary pacemaker, pigtail catheters and right femoral venous sheath were removed with manual pressure used for hemostasis. A Mynx closure device was used for the right femoral artery.   The patient tolerated the procedure well and is transported to the cath lab recovery area in stable condition. There were no immediate intraoperative complications. All sponge instrument and needle counts are verified correct at completion of the operation.   No blood products were administered during the operation.  The patient received a total of 30 mL of intravenous contrast during the procedure.   Gaye Pollack, MD

## 2018-12-31 ENCOUNTER — Inpatient Hospital Stay (HOSPITAL_COMMUNITY): Payer: Medicare Other

## 2018-12-31 ENCOUNTER — Encounter (HOSPITAL_COMMUNITY): Payer: Self-pay

## 2018-12-31 ENCOUNTER — Inpatient Hospital Stay (HOSPITAL_COMMUNITY): Payer: Medicare Other | Admitting: Anesthesiology

## 2018-12-31 ENCOUNTER — Encounter (HOSPITAL_COMMUNITY)
Admission: RE | Disposition: A | Discharge status: Home Health | Payer: Self-pay | Source: Home / Self Care | Attending: Cardiovascular Disease

## 2018-12-31 DIAGNOSIS — R209 Unspecified disturbances of skin sensation: Secondary | ICD-10-CM

## 2018-12-31 DIAGNOSIS — I361 Nonrheumatic tricuspid (valve) insufficiency: Secondary | ICD-10-CM

## 2018-12-31 DIAGNOSIS — I998 Other disorder of circulatory system: Secondary | ICD-10-CM

## 2018-12-31 DIAGNOSIS — I9789 Other postprocedural complications and disorders of the circulatory system, not elsewhere classified: Secondary | ICD-10-CM

## 2018-12-31 DIAGNOSIS — I350 Nonrheumatic aortic (valve) stenosis: Secondary | ICD-10-CM

## 2018-12-31 DIAGNOSIS — Z952 Presence of prosthetic heart valve: Secondary | ICD-10-CM

## 2018-12-31 DIAGNOSIS — M79609 Pain in unspecified limb: Secondary | ICD-10-CM

## 2018-12-31 HISTORY — PX: FEMORAL-POPLITEAL BYPASS GRAFT: SHX937

## 2018-12-31 LAB — BASIC METABOLIC PANEL
ANION GAP: 6 (ref 5–15)
BUN: 12 mg/dL (ref 8–23)
CO2: 23 mmol/L (ref 22–32)
Calcium: 8.4 mg/dL — ABNORMAL LOW (ref 8.9–10.3)
Chloride: 110 mmol/L (ref 98–111)
Creatinine, Ser: 0.68 mg/dL (ref 0.44–1.00)
GFR calc non Af Amer: >60 mL/min (ref >60)
GLUCOSE: 121 mg/dL — AB (ref 70–99)
Potassium: 3.8 mmol/L (ref 3.5–5.1)
SODIUM: 139 mmol/L (ref 135–145)

## 2018-12-31 LAB — ECHOCARDIOGRAM LIMITED
Height: 66 in
Weight: 2613.77 oz

## 2018-12-31 LAB — CBC
HCT: 35 % — ABNORMAL LOW (ref 36.0–46.0)
Hemoglobin: 11.1 g/dL — ABNORMAL LOW (ref 12.0–15.0)
MCH: 29.4 pg (ref 26.0–34.0)
MCHC: 31.7 g/dL (ref 30.0–36.0)
MCV: 92.8 fL (ref 80.0–100.0)
NRBC: 0 % (ref 0.0–0.2)
Platelets: 237 10*3/uL (ref 150–400)
RBC: 3.77 MIL/uL — ABNORMAL LOW (ref 3.87–5.11)
RDW: 13.8 % (ref 11.5–15.5)
WBC: 14.2 10*3/uL — ABNORMAL HIGH (ref 4.0–10.5)

## 2018-12-31 LAB — MAGNESIUM: Magnesium: 1.6 mg/dL — ABNORMAL LOW (ref 1.7–2.4)

## 2018-12-31 SURGERY — BYPASS GRAFT FEMORAL-POPLITEAL ARTERY
Anesthesia: General | Laterality: Left

## 2018-12-31 MED ORDER — PROTAMINE SULFATE 10 MG/ML IV SOLN
INTRAVENOUS | Status: DC | PRN
  Administered 2018-12-31: 50 mg via INTRAVENOUS

## 2018-12-31 MED ORDER — ONDANSETRON HCL 4 MG/2ML IJ SOLN: 4.0000 mg | Freq: Once | INTRAMUSCULAR | Status: DC | PRN

## 2018-12-31 MED ORDER — ROCURONIUM BROMIDE 50 MG/5ML IV SOSY
PREFILLED_SYRINGE | INTRAVENOUS | Status: AC
  Filled 2018-12-31: qty 10

## 2018-12-31 MED ORDER — PROPOFOL 10 MG/ML IV BOLUS
INTRAVENOUS | Status: DC | PRN
  Administered 2018-12-31: 120 mg via INTRAVENOUS

## 2018-12-31 MED ORDER — DEXAMETHASONE SODIUM PHOSPHATE 10 MG/ML IJ SOLN
INTRAMUSCULAR | Status: DC | PRN
  Administered 2018-12-31: 5 mg via INTRAVENOUS

## 2018-12-31 MED ORDER — SODIUM CHLORIDE 0.9 % IV SOLN
INTRAVENOUS | Status: DC | PRN
  Administered 2018-12-31: 500 mL

## 2018-12-31 MED ORDER — ROCURONIUM BROMIDE 50 MG/5ML IV SOSY
PREFILLED_SYRINGE | INTRAVENOUS | Status: AC
  Filled 2018-12-31: qty 5

## 2018-12-31 MED ORDER — SUCCINYLCHOLINE CHLORIDE 200 MG/10ML IV SOSY
PREFILLED_SYRINGE | INTRAVENOUS | Status: AC
  Filled 2018-12-31: qty 10

## 2018-12-31 MED ORDER — EPHEDRINE 5 MG/ML INJ
INTRAVENOUS | Status: AC
  Filled 2018-12-31: qty 10

## 2018-12-31 MED ORDER — HEPARIN SODIUM (PORCINE) 1000 UNIT/ML IJ SOLN
INTRAMUSCULAR | Status: DC | PRN
  Administered 2018-12-31: 7000 [IU] via INTRAVENOUS

## 2018-12-31 MED ORDER — SODIUM CHLORIDE 0.9 % IV SOLN
INTRAVENOUS | Status: DC | PRN
  Administered 2018-12-31: 20 ug/min via INTRAVENOUS

## 2018-12-31 MED ORDER — ROCURONIUM BROMIDE 10 MG/ML (PF) SYRINGE
PREFILLED_SYRINGE | INTRAVENOUS | Status: DC | PRN
  Administered 2018-12-31 (×2): 10 mg via INTRAVENOUS
  Administered 2018-12-31: 40 mg via INTRAVENOUS
  Administered 2018-12-31: 10 mg via INTRAVENOUS

## 2018-12-31 MED ORDER — LIDOCAINE 2% (20 MG/ML) 5 ML SYRINGE
INTRAMUSCULAR | Status: AC
  Filled 2018-12-31: qty 5

## 2018-12-31 MED ORDER — SUCCINYLCHOLINE CHLORIDE 20 MG/ML IJ SOLN
INTRAMUSCULAR | Status: DC | PRN
  Administered 2018-12-31: 100 mg via INTRAVENOUS

## 2018-12-31 MED ORDER — HYDRALAZINE HCL 20 MG/ML IJ SOLN
5.0000 mg | INTRAMUSCULAR | Status: DC | PRN
  Filled 2018-12-31: qty 0.25

## 2018-12-31 MED ORDER — 0.9 % SODIUM CHLORIDE (POUR BTL) OPTIME
TOPICAL | Status: DC | PRN
  Administered 2018-12-31: 2000 mL

## 2018-12-31 MED ORDER — FENTANYL CITRATE (PF) 250 MCG/5ML IJ SOLN
INTRAMUSCULAR | Status: DC | PRN
  Administered 2018-12-31 (×2): 50 ug via INTRAVENOUS
  Administered 2018-12-31: 150 ug via INTRAVENOUS
  Administered 2018-12-31: 50 ug via INTRAVENOUS

## 2018-12-31 MED ORDER — PHENYLEPHRINE 40 MCG/ML (10ML) SYRINGE FOR IV PUSH (FOR BLOOD PRESSURE SUPPORT)
PREFILLED_SYRINGE | INTRAVENOUS | Status: AC
  Filled 2018-12-31: qty 10

## 2018-12-31 MED ORDER — HEPARIN SODIUM (PORCINE) 1000 UNIT/ML IJ SOLN
INTRAMUSCULAR | Status: AC
  Filled 2018-12-31: qty 1

## 2018-12-31 MED ORDER — FENTANYL CITRATE (PF) 250 MCG/5ML IJ SOLN
INTRAMUSCULAR | Status: AC
  Filled 2018-12-31: qty 5

## 2018-12-31 MED ORDER — PHENYLEPHRINE 40 MCG/ML (10ML) SYRINGE FOR IV PUSH (FOR BLOOD PRESSURE SUPPORT)
PREFILLED_SYRINGE | INTRAVENOUS | Status: DC | PRN
  Administered 2018-12-31 (×2): 80 ug via INTRAVENOUS

## 2018-12-31 MED ORDER — SODIUM CHLORIDE 0.9 % IV SOLN
INTRAVENOUS | Status: DC
  Administered 2018-12-31: 19:00:00 via INTRAVENOUS

## 2018-12-31 MED ORDER — PHENOL 1.4 % MT LIQD: 1.0000 | OROMUCOSAL | Status: DC | PRN

## 2018-12-31 MED ORDER — ONDANSETRON HCL 4 MG/2ML IJ SOLN
INTRAMUSCULAR | Status: DC | PRN
  Administered 2018-12-31: 4 mg via INTRAVENOUS

## 2018-12-31 MED ORDER — GUAIFENESIN-DM 100-10 MG/5ML PO SYRP: 15.0000 mL | ORAL_SOLUTION | ORAL | Status: DC | PRN

## 2018-12-31 MED ORDER — DOCUSATE SODIUM 100 MG PO CAPS
100.0000 mg | ORAL_CAPSULE | Freq: Every day | ORAL | Status: DC
  Administered 2019-01-01: 100 mg via ORAL
  Filled 2018-12-31: qty 1

## 2018-12-31 MED ORDER — ONDANSETRON HCL 4 MG/2ML IJ SOLN
INTRAMUSCULAR | Status: AC
  Filled 2018-12-31: qty 2

## 2018-12-31 MED ORDER — EPHEDRINE SULFATE-NACL 50-0.9 MG/10ML-% IV SOSY
PREFILLED_SYRINGE | INTRAVENOUS | Status: DC | PRN
  Administered 2018-12-31: 10 mg via INTRAVENOUS

## 2018-12-31 MED ORDER — SODIUM CHLORIDE 0.9 % IV SOLN
INTRAVENOUS | Status: AC
  Filled 2018-12-31: qty 1.2

## 2018-12-31 MED ORDER — PROPOFOL 10 MG/ML IV BOLUS
INTRAVENOUS | Status: AC
  Filled 2018-12-31: qty 20

## 2018-12-31 MED ORDER — LACTATED RINGERS IV SOLN
INTRAVENOUS | Status: DC | PRN
  Administered 2018-12-31 (×2): via INTRAVENOUS

## 2018-12-31 MED ORDER — LIDOCAINE 2% (20 MG/ML) 5 ML SYRINGE
INTRAMUSCULAR | Status: DC | PRN
  Administered 2018-12-31: 60 mg via INTRAVENOUS

## 2018-12-31 MED ORDER — CLOPIDOGREL BISULFATE 75 MG PO TABS: 75.0000 mg | ORAL_TABLET | Freq: Every day | ORAL | 1 refills | Status: DC

## 2018-12-31 MED ORDER — FENTANYL CITRATE (PF) 100 MCG/2ML IJ SOLN: 25.0000 ug | INTRAMUSCULAR | Status: DC | PRN

## 2018-12-31 MED ORDER — SUGAMMADEX SODIUM 200 MG/2ML IV SOLN
INTRAVENOUS | Status: DC | PRN
  Administered 2018-12-31: 150 mg via INTRAVENOUS

## 2018-12-31 MED ORDER — DEXAMETHASONE SODIUM PHOSPHATE 10 MG/ML IJ SOLN
INTRAMUSCULAR | Status: AC
  Filled 2018-12-31: qty 1

## 2018-12-31 MED ORDER — HEPARIN SODIUM (PORCINE) 5000 UNIT/ML IJ SOLN
5000.0000 [IU] | Freq: Two times a day (BID) | INTRAMUSCULAR | Status: DC
  Administered 2018-12-31: 5000 [IU] via SUBCUTANEOUS
  Filled 2018-12-31: qty 1

## 2018-12-31 MED FILL — Heparin Sod (Porcine)-NaCl IV Soln 1000 Unit/500ML-0.9%: INTRAVENOUS | Qty: 500 | Status: AC

## 2018-12-31 SURGICAL SUPPLY — 43 items
BANDAGE ESMARK 6X9 LF (GAUZE/BANDAGES/DRESSINGS) IMPLANT
BNDG ESMARK 6X9 LF (GAUZE/BANDAGES/DRESSINGS)
CANISTER SUCT 3000ML PPV (MISCELLANEOUS) ×3 IMPLANT
CANNULA VESSEL 3MM 2 BLNT TIP (CANNULA) ×6 IMPLANT
CLIP LIGATING EXTRA MED SLVR (CLIP) ×3 IMPLANT
CLIP LIGATING EXTRA SM BLUE (MISCELLANEOUS) ×3 IMPLANT
COVER WAND RF STERILE (DRAPES) ×3 IMPLANT
CUFF TOURNIQUET SINGLE 34IN LL (TOURNIQUET CUFF) IMPLANT
CUFF TOURNIQUET SINGLE 44IN (TOURNIQUET CUFF) IMPLANT
DERMABOND ADVANCED (GAUZE/BANDAGES/DRESSINGS) ×2
DERMABOND ADVANCED .7 DNX12 (GAUZE/BANDAGES/DRESSINGS) ×1 IMPLANT
DRAIN SNY 10X20 3/4 PERF (WOUND CARE) IMPLANT
DRAPE HALF SHEET 40X57 (DRAPES) IMPLANT
DRAPE X-RAY CASS 24X20 (DRAPES) IMPLANT
ELECT REM PT RETURN 9FT ADLT (ELECTROSURGICAL) ×3
ELECTRODE REM PT RTRN 9FT ADLT (ELECTROSURGICAL) ×1 IMPLANT
EVACUATOR SILICONE 100CC (DRAIN) IMPLANT
GLOVE SS BIOGEL STRL SZ 7.5 (GLOVE) ×1 IMPLANT
GLOVE SUPERSENSE BIOGEL SZ 7.5 (GLOVE) ×2
GOWN STRL REUS W/ TWL LRG LVL3 (GOWN DISPOSABLE) ×3 IMPLANT
GOWN STRL REUS W/TWL LRG LVL3 (GOWN DISPOSABLE) ×6
INSERT FOGARTY SM (MISCELLANEOUS) IMPLANT
KIT BASIN OR (CUSTOM PROCEDURE TRAY) ×3 IMPLANT
KIT TURNOVER KIT B (KITS) ×3 IMPLANT
NS IRRIG 1000ML POUR BTL (IV SOLUTION) ×6 IMPLANT
PACK PERIPHERAL VASCULAR (CUSTOM PROCEDURE TRAY) ×3 IMPLANT
PAD ARMBOARD 7.5X6 YLW CONV (MISCELLANEOUS) ×6 IMPLANT
PADDING CAST COTTON 6X4 STRL (CAST SUPPLIES) IMPLANT
PATCH HEMASHIELD 8X75 (Vascular Products) ×3 IMPLANT
SET COLLECT BLD 21X3/4 12 (NEEDLE) IMPLANT
STOPCOCK 4 WAY LG BORE MALE ST (IV SETS) IMPLANT
SUT ETHILON 3 0 PS 1 (SUTURE) IMPLANT
SUT PROLENE 5 0 C 1 24 (SUTURE) ×3 IMPLANT
SUT PROLENE 6 0 CC (SUTURE) ×21 IMPLANT
SUT SILK 2 0 SH (SUTURE) ×3 IMPLANT
SUT VIC AB 2-0 CTX 36 (SUTURE) ×6 IMPLANT
SUT VIC AB 3-0 SH 27 (SUTURE) ×4
SUT VIC AB 3-0 SH 27X BRD (SUTURE) ×2 IMPLANT
TOWEL GREEN STERILE (TOWEL DISPOSABLE) ×3 IMPLANT
TRAY FOLEY MTR SLVR 16FR STAT (SET/KITS/TRAYS/PACK) ×3 IMPLANT
TUBING EXTENTION W/L.L. (IV SETS) IMPLANT
UNDERPAD 30X30 (UNDERPADS AND DIAPERS) ×3 IMPLANT
WATER STERILE IRR 1000ML POUR (IV SOLUTION) ×3 IMPLANT

## 2018-12-31 NOTE — Progress Notes (Signed)
Pt complained of LLE pain, numbness prior to being discharged. Unable to get doppler pulse on LLE. Dr. [name] called. Dr. [name] consulted [facility]. Pt made NPO per verbal order.   Fritz Pickerel, RN

## 2018-12-31 NOTE — Progress Notes (Signed)
CARDIAC REHAB PHASE I     MODE:  Ambulation: 470 ft   117 peak HR  POST:  Rate/Rhythm: 95 SR  BP:  Sitting: 160/67 --> 135/52 (after rest)    SaO2: 97 RA   Pt seen ambulating in hallway with family member. Pt denies SOB, but c/o L leg pain that starts in her groin and goes down her leg. Pt also states some numbness in in that same leg. Pt educated on site care. Reviewed restrictions and exercise guidelines. Will refer to CRP II [place].   [id number] Rufina Falco, RN BSN 12/31/2018 11:57 AM

## 2018-12-31 NOTE — Consult Note (Signed)
Vascular and Vein Specialist of [facility]  Patient name: Dominique [name] MRN: [id number] DOB: Mar 23, 1941 Sex: female    HPI: Dominique [name] is a 78 y.o. female seen for evaluation of left leg ischemia.  She underwent a uneventfulTAVR yesterday.  I was for discharge today.  Was complaining of coolness and numbness and noted that her left foot was pale compared to the right.  Also reports pain when she stands to walk.  No prior history of arterial insufficiency.  No history of claudication or rest pain.  Past Medical History:  Diagnosis Date  . Emphysema lung ([place])   . Hyperlipidemia    LDL goal < 70  . Hypertension   . Pulmonary HTN ([place]) 06/05/2016   Moderate with PASP 61mmHg by echo 04/2016 likely Group 3 from COPD and possibly Group 2 from pulmonary venous HTN associated with moderate AS  . Pulmonary nodules    seen on pre TAVR CT, needs 12 month CT follow up  . 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 Sacral fracture ([place])   . Severe aortic stenosis   . Thoracic ascending aortic aneurysm [facility])    needs yearly follow up    Family History  Problem Relation Age of Onset  . Breast cancer Mother        breast  . Diabetes Son        type 1  . Cancer Brother        lung  . Colon cancer Neg Hx   . CAD Neg Hx   . Colon polyps Neg Hx   . Rectal cancer Neg Hx   . Stomach cancer Neg Hx     SOCIAL HISTORY: Social History   Tobacco Use  . Smoking status: Former Smoker    Packs/day: 1.00    Years: 40.00    Pack years: 40.00    Types: Cigarettes    Last attempt to quit: 03/19/2004    Years since quitting: 14.7  . Smokeless tobacco: Never Used  Substance Use Topics  . Alcohol use: Yes    Alcohol/week: 3.0 - 4.0 standard drinks    Types: 3 - 4 Glasses of wine per week    Comment: socially    No Known Allergies  Current  Facility-Administered Medications  Medication Dose Route Frequency Provider Last Rate Last Dose  . 0.9 %  sodium chloride infusion  250 mL Intravenous PRN Eileen Stanford, PA-C      . acetaminophen (TYLENOL) tablet 650 mg  650 mg Oral Q6H PRN Eileen Stanford, PA-C   650 mg at 12/31/18 6283   Or  . acetaminophen (TYLENOL) suppository 650 mg  650 mg Rectal Q6H PRN Eileen Stanford, PA-C      . aspirin EC tablet 81 mg  81 mg Oral Daily Eileen Stanford, PA-C   81 mg at 12/31/18 1517  . cefUROXime (ZINACEF) 1.5 g in sodium chloride 0.9 % 100 mL IVPB  1.5 g Intravenous Q12H Eileen Stanford, PA-C 200 mL/hr at 12/31/18 0907 1.5 g at 12/31/18 0907  . clopidogrel (PLAVIX) tablet 75 mg  75 mg Oral Q breakfast Eileen Stanford, PA-C  75 mg at 12/31/18 0903  . metoprolol tartrate (LOPRESSOR) injection 2.5-5 mg  2.5-5 mg Intravenous Q2H PRN Eileen Stanford, PA-C      . morphine 2 MG/ML injection 1-4 mg  1-4 mg Intravenous Q1H PRN Eileen Stanford, PA-C      . mupirocin ointment (BACTROBAN) 2 % 1 application  1 application Nasal BID Sherren Mocha, MD   1 application at 62/69/48 [phone]  . nitroGLYCERIN 50 mg in dextrose 5 % 250 mL (0.2 mg/mL) infusion  0-100 mcg/min Intravenous Titrated Eileen Stanford, PA-C      . ondansetron [facility]) injection 4 mg  4 mg Intravenous Q6H PRN Eileen Stanford, PA-C      . oxyCODONE (Oxy IR/ROXICODONE) immediate release tablet 5-10 mg  5-10 mg Oral Q3H PRN Eileen Stanford, PA-C      . phenylephrine (NEOSYNEPHRINE) 20-0.9 MG/250ML-% infusion  0-100 mcg/min Intravenous Titrated Eileen Stanford, PA-C      . rosuvastatin (CRESTOR) tablet 40 mg  40 mg Oral q1800 Eileen Stanford, PA-C   40 mg at 12/30/18 1810  . sodium chloride flush (NS) 0.9 % injection 3 mL  3 mL Intravenous Q12H Eileen Stanford, PA-C   3 mL at 12/31/18 7035  . sodium chloride flush (NS) 0.9 % injection 3 mL  3 mL Intravenous PRN Eileen Stanford, PA-C      .  traMADol Veatrice Bourbon) tablet 50-100 mg  50-100 mg Oral Q4H PRN Eileen Stanford, PA-C        REVIEW OF SYSTEMS:  [X]  denotes positive finding, [ ]  denotes negative finding Cardiac  Comments:  Chest pain or chest pressure:    Shortness of breath upon exertion:    Short of breath when lying flat:    Irregular heart rhythm:        Vascular    Pain in calf, thigh, or hip brought on by ambulation:    Pain in feet at night that wakes you up from your sleep:     Blood clot in your veins:    Leg swelling:           PHYSICAL EXAM: Vitals:   12/30/18 2330 12/31/18 0418 12/31/18 0757 12/31/18 0854  BP: (!) 112/54 127/79 125/66 (!) 125/54  Pulse: 76 86    Resp: (!) 22 17 20 18   Temp: 98.5 F (36.9 C) 97.7 F (36.5 C) 97.7 F (36.5 C) 97.9 F (36.6 C)  TempSrc: Oral Oral Oral Oral  SpO2: 97% 97% 95% 95%  Weight:  74.1 kg    Height:        GENERAL: The patient is a well-nourished female, in no acute distress. The vital signs are documented above. CARDIOVASCULAR: Right leg with 2+ dorsalis pedis and posterior tibial pulse.  No femoral pulse noted on the left and no pedal pulses.  No Doppler flow in her left foot.  She can move her toes but does have diminished sensory function. PULMONARY: There is good air exchange  MUSCULOSKELETAL: There are no major deformities or cyanosis. NEUROLOGIC: No focal weakness or paresthesias are detected. SKIN: There are no ulcers or rashes noted. PSYCHIATRIC: The patient has a normal affect.  DATA:  CT scan from 12/09/2018 was reviewed.  This showed completely normal flow through her aorta and both iliac arteries and also normal flow through the common femoral and proximal superficial femoral and profundus femoris arteries bilaterally  Bedside duplex shows apparent flap in the common femoral artery with minimal flow  past this.  MEDICAL ISSUES: Severe acute ischemia lower extremity related to probable arterial flap and closure site.  Patient with  neurologic deficit.  Will take immediately to the operating room for repair.  Described in detail with the patient who understands.  She did have some lunch approximately 1-1/2 hours ago.  Concern regarding her ischemic symptoms and will proceed with surgery    Rosetta Posner, MD [facility] Vascular and Vein Specialists of [facility] Tel [phone] Pager [phone]

## 2018-12-31 NOTE — Progress Notes (Addendum)
HEART AND VASCULAR CENTER   MULTIDISCIPLINARY HEART VALVE TEAM  Patient Name: Dominique [name] Date of Encounter: 12/31/2018  Primary Cardiologist: Dr. [name] / Dr. [name] & Dr. Cyndia Bent (TAVR)   Hospital Problem List     Principal Problem:   S/P TAVR (transcatheter aortic valve replacement) Active Problems:   Severe aortic stenosis   Essential hypertension   Hyperlipidemia   COPD GOLD II   Multiple lung nodules   CAD (coronary artery disease), native coronary artery   Thoracic ascending aortic aneurysm ([facility])     Subjective   Complains of left leg and foot pain with ambulation as well as numbness. Otherwise feeling good with no complaints.   Inpatient Medications    Scheduled Meds: . aspirin EC  81 mg Oral Daily  . clopidogrel  75 mg Oral Q breakfast  . mupirocin ointment  1 application Nasal BID  . rosuvastatin  40 mg Oral q1800  . sodium chloride flush  3 mL Intravenous Q12H   Continuous Infusions: . sodium chloride    . cefUROXime (ZINACEF)  IV 1.5 g (12/31/18 0907)  . nitroGLYCERIN    . phenylephrine (NEO-SYNEPHRINE) Adult infusion     PRN Meds: sodium chloride, acetaminophen **OR** acetaminophen, metoprolol tartrate, morphine injection, ondansetron (ZOFRAN) IV, oxyCODONE, sodium chloride flush, traMADol   Vital Signs    Vitals:   12/30/18 2330 12/31/18 0418 12/31/18 0757 12/31/18 0854  BP: (!) 112/54 127/79 125/66 (!) 125/54  Pulse: 76 86    Resp: (!) 22 17 20 18   Temp: 98.5 F (36.9 C) 97.7 F (36.5 C) 97.7 F (36.5 C) 97.9 F (36.6 C)  TempSrc: Oral Oral Oral Oral  SpO2: 97% 97% 95% 95%  Weight:  74.1 kg    Height:        Intake/Output Summary (Last 24 hours) at 12/31/2018 1406 Last data filed at 12/31/2018 0904 Gross per 24 hour  Intake 1803.56 ml  Output 500 ml  Net 1303.56 ml   Filed Weights   12/30/18 0724 12/30/18 1400 12/31/18 0418  Weight: 71.2 kg 71.2 kg 74.1 kg    Physical Exam    GEN: Well nourished, well developed, in no acute  distress.  HEENT: Grossly normal.  Neck: Supple, no JVD, carotid bruits, or masses. Cardiac: RRR, soft flow murmur. No rubs, or gallops. No clubbing, cyanosis, edema.   Respiratory:  Respirations regular and unlabored, clear to auscultation bilaterally. GI: Soft, nontender, nondistended, BS + x 4. MS: no deformity or atrophy. Skin: warm and dry, no rash. Left leg cool and unable to get a doppler pulse on left side Neuro:  Strength and sensation are intact. Psych: AAOx3.  Normal affect.  Labs    CBC Recent Labs    12/30/18 1130 12/31/18 0500  WBC  --  14.2*  HGB 11.2* 11.1*  HCT 33.0* 35.0*  MCV  --  92.8  PLT  --  628   Basic Metabolic Panel Recent Labs    12/30/18 1106 12/30/18 1130 12/30/18 1140 12/31/18 0500  NA 140 141  --  139  K 4.0 4.0  --  3.8  CL  --   --   --  110  CO2  --   --   --  23  GLUCOSE 131*  --   --  121*  BUN  --   --   --  12  CREATININE  --   --  0.60 0.68  CALCIUM  --   --   --  8.4*  MG  --   --   --  1.6*   Liver Function Tests No results for input(s): AST, ALT, ALKPHOS, BILITOT, PROT, ALBUMIN in the last 72 hours. No results for input(s): LIPASE, AMYLASE in the last 72 hours. Cardiac Enzymes No results for input(s): CKTOTAL, CKMB, CKMBINDEX, TROPONINI in the last 72 hours. BNP Invalid input(s): POCBNP D-Dimer No results for input(s): DDIMER in the last 72 hours. Hemoglobin A1C No results for input(s): HGBA1C in the last 72 hours. Fasting Lipid Panel No results for input(s): CHOL, HDL, LDLCALC, TRIG, CHOLHDL, LDLDIRECT in the last 72 hours. Thyroid Function Tests No results for input(s): TSH, T4TOTAL, T3FREE, THYROIDAB in the last 72 hours.  Invalid input(s): FREET3  Telemetry    sinus - Personally Reviewed  ECG    sinus - Personally Reviewed  Radiology    Dg Chest Port 1 View  Result Date: 12/30/2018 CLINICAL DATA:  TAVR EXAM: PORTABLE CHEST 1 VIEW COMPARISON:  12/26/2018 FINDINGS: Postop TAVR in satisfactory position.  Heart size within normal limits. Negative for heart failure. Lungs are well aerated. Mild atelectasis in the lingula. Right jugular central venous catheter in the lower SVC. No pneumothorax. IMPRESSION: Postop TAVR.  Negative for heart failure Mild atelectasis in the lingula. Electronically Signed   By: Franchot Gallo M.D.   On: 12/30/2018 14:12    Cardiac Studies   TAVR OPERATIVE NOTE  Date of Procedure:12/30/2018  Preoperative Diagnosis:Severe Aortic Stenosis   Postoperative Diagnosis:Same   Procedure:   Transcatheter Aortic Valve Replacement - PercutaneousRightTransfemoral Approach Medtronic CoreValve Evolut Pro +(size 50mm, serial # [id number])  Co-Surgeons:Bryan Alveria Apley, MD and Sherren Mocha, MD   Anesthesiologist:Ryan Roanna Banning, MD  Dala Dock, MD  Pre-operative Echo Findings: ? Severe aortic stenosis ? Normal left ventricular systolic function  Post-operative Echo Findings: ? Noparavalvular leak ? Normal left ventricular systolic function  _____________   Echo 12/31/18 IMPRESSIONS  1. The left ventricle has hyperdynamic systolic function, with an ejection fraction of >65%. The cavity size was normal. There is moderately increased left ventricular wall thickness. Left ventricular diastolic Doppler parameters are consistent with  impaired relaxation Indeterminent filling pressures No evidence of left ventricular regional wall motion abnormalities.  2. The right ventricle has normal systolic function. The cavity was normal. There is no increase in right ventricular wall thickness.  3. Left atrial size was mildly dilated.  4. The mitral valve is degenerative. Mild calcification of the mitral valve leaflet.  5. The aortic root and ascending aorta are normal in size and structure.  6. The inferior vena cava was dilated in size with <50%  respiratory variability.  7. No evidence of left ventricular regional wall motion abnormalities.  8. When compared to the prior study: 10/30/2018 - 60-65%, severe AS.   Patient Profile     Dominique [name] is a 78 y.o. female with a history of HTN, HLD, COPD and severe AS who presented to [facility] on 12/30/18 for planned TAVR.  Assessment & Plan    Severe AS:s/p successful TAVR with a 26 mm Medtronic CoreValve Evolut Pro via the TF approach on 12/30/2018. Post operative echo completed but pending formal read. Groin sites are stable. ECG with sinus and no high grade heart block. Continue Asprin and plavix.   HTN: BP has been in the normal range.  HLD: continue statin   COPD: stable.   Pulmonary nodule: seen on pre TAVR CT scan. This will required 12 month follow up CT. I will discuss this  as an outpatient.   Thoracic ascending aortic aneurysm: (40 mm) seen on pre TAVR CT scan. This will required 12 month follow up CT. I will discuss this as an outpatient.   Leg leg and foot pain: patient complained of left leg pain that resolved. Plan was for DC home, but the pain returned with ambulation. She also has had numbness. Nursing staff noticed left foot was cooler and Dr. [name] was unable to get a doppler pulse.  Plan for STAT arterial dopplers and VVS consult.    Signed, Angelena Form, PA-C  12/31/2018, 2:06 PM  Pager [phone]   Patient seen, examined. Available data reviewed. Agree with findings, assessment, and plan as outlined by Nell Range, PA-C. The patient is independently interviewed and examined. On my exam, heart is RRR with 1/6 SEM at the RUSB. Lungs CTA. Abd: soft, NT. Extremities no edema. Her left foot is cool compared to the right and I am not able to palpate a pulse or obtain a doppler signal. A stat arterial duplex is ordered and Dr [name] is called for vascular consultation. I appreciate his consultation in advance. Discussed findings with the patient and her daughter.    Sherren Mocha, M.D. 12/31/2018 1:44 PM

## 2018-12-31 NOTE — Transfer of Care (Signed)
Immediate Anesthesia Transfer of Care Note  Patient: Dominique [name]  Procedure(s) Performed: PATCH ANGIOPLASTY REPAIR LEFT FEMORAL ARTERY USING HEMASHIELD PLATINUM FINESSE PATCH (Left )  Patient Location: PACU  Anesthesia Type:General  Level of Consciousness: awake, alert  and oriented  Airway & Oxygen Therapy: Patient Spontanous Breathing and Patient connected to nasal cannula oxygen  Post-op Assessment: Report given to RN and Post -op Vital signs reviewed and stable  Post vital signs: Reviewed and stable  Last Vitals:  Vitals Value Taken Time  BP 120/56 12/31/2018  5:43 PM  Temp    Pulse 95 12/31/2018  5:53 PM  Resp 14 12/31/2018  5:53 PM  SpO2 95 % 12/31/2018  5:53 PM  Vitals shown include unvalidated device data.  Last Pain:  Vitals:   12/31/18 1424  TempSrc: Oral  PainSc:       Patients Stated Pain Goal: 0 (37/16/96 7893)  Complications: No apparent anesthesia complications

## 2018-12-31 NOTE — Discharge Summary (Deleted)
[facility] VALVE TEAM  Discharge Summary    Patient ID: Dominique [name] MRN: [id number]; DOB: 13-Mar-1941  Admit date: 12/30/2018 Discharge date: 12/31/2018  Primary Care Provider: Colon Branch, MD  Primary Cardiologist: Dr. [name] / Dr. [name] & Dr. Cyndia Bent (TAVR)   Discharge Diagnoses    Principal Problem:   S/P TAVR (transcatheter aortic valve replacement) Active Problems:   Severe aortic stenosis   Essential hypertension   Hyperlipidemia   COPD GOLD II   Multiple lung nodules   CAD (coronary artery disease), native coronary artery   Thoracic ascending aortic aneurysm (HCC)   Allergies No Known Allergies  Diagnostic Studies/Procedures    TAVR OPERATIVE NOTE  Date of Procedure:                12/30/2018  Preoperative Diagnosis:      Severe Aortic Stenosis   Postoperative Diagnosis:    Same   Procedure:        Transcatheter Aortic Valve Replacement - Percutaneous Right Transfemoral Approach             Medtronic CoreValve Evolut Pro + (size 26 mm,  serial # [id number])              Co-Surgeons:                        Gaye Pollack, MD and Sherren Mocha, MD   Anesthesiologist:                  Adele Barthel, MD  Echocardiographer:              Ena Dawley, MD  Pre-operative Echo Findings: ? Severe aortic stenosis ? Normal left ventricular systolic function  Post-operative Echo Findings: ? No paravalvular leak ? Normal left ventricular systolic function  _____________   Echo 12/31/18: pending formal read  History of Present Illness     Dominique [name] is a 78 y.o. female with a history of HTN, HLD, COPD and severe AS who presented to [facility] on 12/30/18 for planned TAVR.  She said that she was told as a child in [place] that she had some abnormality of her aortic valve.  An echocardiogram in 05/2017 showed what appeared to be a trileaflet aortic valve with a mean gradient of 21 mmHg consistent with moderate  stenosis.  She had a follow-up echocardiogram in 05/2018 which showed an increase in the mean gradient to 36 mmHg with normal left ventricular function but she remained asymptomatic.  She now presents with a several month history of progressive exertional fatigue and shortness of breath. A repeat echocardiogram on 10/31/2018 shows a trileaflet aortic valve with moderate thickening and calcification of the leaflets with restricted mobility.  The mean gradient increased further to 39 mmHg with a peak gradient of 64 mmHg.  There is no regurgitation.  Aortic valve area was measured at 0.88 cm.  Left ventricular ejection fraction was 60 to 65% with grade 1 diastolic dysfunction.  She underwent cardiac catheterization on 11/27/2018 showing normal coronary arteries.  The mean gradient across aortic valve was measured at 17 mmHg with with a calculated valve area of 1.47 cm.  Right heart pressures were normal.   The patient has been evaluated by the multidisciplinary valve team and felt to have severe, symptomatic aortic stenosis and to be a suitable candidate for TAVR, which was set up for 12/30/18. Given small annular size it was  planned to use a Medtronic Evolut Pro.    Hospital Course     Consultants: none  Severe AS: s/p successful TAVR with a 26 mm Medtronic CoreValve Evolut Pro via the TF approach on 12/30/2018. Post operative echo completed but pending formal read. Groin sites are stable. ECG with sinus and no high grade heart block. Continue Asprin and plavix. Plan for DC home with close follow up in office next week  HTN: BP has been in the normal range. Plan to resume home Benazepril 20mg  daily. Will hold amlodipine 10mg  daily and add back next week if BP elevated.   HLD: continue statin   COPD: stable.   Pulmonary nodule: seen on pre TAVR CT scan. This will required 12 month follow up CT. I will discuss this as an outpatient.   Thoracic ascending aortic aneurysm: (40 mm) seen on pre TAVR CT scan.  This will required 12 month follow up CT. I will discuss this as an outpatient.   _____________  Discharge Vitals Blood pressure (!) 125/54, pulse 86, temperature 97.9 F (36.6 C), temperature source Oral, resp. rate 18, height 5\' 6"  (1.676 m), weight 74.1 kg, SpO2 95 %.  Filed Weights   12/30/18 0724 12/30/18 1400 12/31/18 0418  Weight: 71.2 kg 71.2 kg 74.1 kg   VS:  BP (!) 125/54 (BP Location: Right Arm)   Pulse 86   Temp 97.9 F (36.6 C) (Oral)   Resp 18   Ht 5\' 6"  (1.676 m)   Wt 74.1 kg   SpO2 95%   BMI 26.37 kg/m    GEN: Well nourished, well developed, in no acute distress HEENT: normal Neck: no JVD or masses Cardiac: RRR; no murmurs, rubs, or gallops,no edema  Respiratory:  clear to auscultation bilaterally, normal work of breathing GI: soft, nontender, nondistended, + BS MS: no deformity or atrophy Skin: warm and dry, no rash. Groin sites healing well without hematoma or ecchymosis  Neuro:  Alert and Oriented x 3, Strength and sensation are intact Psych: euthymic mood, full affect    Labs & Radiologic Studies    CBC Recent Labs    12/30/18 1130 12/31/18 0500  WBC  --  14.2*  HGB 11.2* 11.1*  HCT 33.0* 35.0*  MCV  --  92.8  PLT  --  001   Basic Metabolic Panel Recent Labs    12/30/18 1106 12/30/18 1130 12/30/18 1140 12/31/18 0500  NA 140 141  --  139  K 4.0 4.0  --  3.8  CL  --   --   --  110  CO2  --   --   --  23  GLUCOSE 131*  --   --  121*  BUN  --   --   --  12  CREATININE  --   --  0.60 0.68  CALCIUM  --   --   --  8.4*  MG  --   --   --  1.6*   Liver Function Tests No results for input(s): AST, ALT, ALKPHOS, BILITOT, PROT, ALBUMIN in the last 72 hours. No results for input(s): LIPASE, AMYLASE in the last 72 hours. Cardiac Enzymes No results for input(s): CKTOTAL, CKMB, CKMBINDEX, TROPONINI in the last 72 hours. BNP Invalid input(s): POCBNP D-Dimer No results for input(s): DDIMER in the last 72 hours. Hemoglobin A1C No results for  input(s): HGBA1C in the last 72 hours. Fasting Lipid Panel No results for input(s): CHOL, HDL, LDLCALC, TRIG, CHOLHDL, LDLDIRECT in the last  72 hours. Thyroid Function Tests No results for input(s): TSH, T4TOTAL, T3FREE, THYROIDAB in the last 72 hours.  Invalid input(s): FREET3 _____________  Dg Chest 2 View  Result Date: 12/26/2018 CLINICAL DATA:  Pre-op for heart surgery - aortic stenosis - hx of exsmoker, emphysema, pulm htn EXAM: CHEST - 2 VIEW COMPARISON:  None. FINDINGS: Normal mediastinum and cardiac silhouette. Normal pulmonary vasculature. No evidence of effusion, infiltrate, or pneumothorax. No acute bony abnormality. IMPRESSION: No acute cardiopulmonary process. Electronically Signed   By: Suzy Bouchard M.D.   On: 12/26/2018 15:22   Ct Coronary Morph W/cta Cor W/score W/ca W/cm &/or Wo/cm  Addendum Date: 12/10/2018   ADDENDUM REPORT: 12/10/2018 07:52 EXAM: OVER-READ INTERPRETATION  CT CHEST The following report is an over-read performed by radiologist Dr. Rebekah [facility] on 12/10/2018. This over-read does not include interpretation of cardiac or coronary anatomy or pathology. The calcium score and cardiac CTA interpretation by the cardiologist is attached. COMPARISON:  Cardiac CTA 09/02/2018. FINDINGS: Extracardiac findings will be described separately under dictation for contemporaneously obtained CTA chest, abdomen and pelvis. IMPRESSION: Please see separate dictation for contemporaneously obtained CTA chest, abdomen and pelvis dated 12/09/2018 for full description of relevant extracardiac findings. Electronically Signed   By: Vinnie Langton M.D.   On: 12/10/2018 07:52   Result Date: 12/10/2018 CLINICAL DATA:  Aortic Stenosis EXAM: Cardiac TAVR CT TECHNIQUE: The patient was scanned on a Siemens Force 546 slice scanner. A 120 kV retrospective scan was triggered in the ascending thoracic aorta at 140 HU's. Gantry rotation speed was 250 msecs and collimation  was .6 mm. No beta blockade or nitro were given. The 3D data set was reconstructed in 5% intervals of the R-R cycle. Systolic and diastolic phases were analyzed on a dedicated work station using MPR, MIP and VRT modes. The patient received 80 cc of contrast. FINDINGS: Aortic Valve: Functionally bicuspid with fusion of left and righ cusps Aorta: Dilated 3.8 cm normal arch vessels no coarctation Sino-tubular Junction: 27 mm Ascending Thoracic Aorta: 38 mm Aortic Arch: 21 mm Descending Thoracic Aorta: 23 mm Sinus of Valsalva Measurements: Non-coronary: 27.3 mm Right - coronary: 25.8 mm Left -   coronary: 27.8 mm Coronary Artery Height above Annulus: Left Main: 13.3 mm above annulus Right Coronary: 11.2 mm above annulus Virtual Basal Annulus Measurements: Maximum / Minimum Diameter: 21.3 mm x 19.7 mm Perimeter: 66 mm Area: 330 mm2 Coronary Arteries: Sufficient height above annulus for deployment Optimum Fluoroscopic Angle for Delivery: LAO 17 Caudal 23 degrees IMPRESSION: 1. Functionally bicuspid AV with annular area of 330 mm2 suitable for a 20 mm Sapien 3 valve 2.  Mild aortic root dilatation 3.8 cm 3.  Coronary arteries sufficient height above annulus for deployment 4. Optimum angiographic angle for deployment LAO 17 degrees Caudal 23 degrees Jenkins Rouge Electronically Signed: By: Jenkins Rouge M.D. On: 12/09/2018 14:18   Dg Chest Port 1 View  Result Date: 12/30/2018 CLINICAL DATA:  TAVR EXAM: PORTABLE CHEST 1 VIEW COMPARISON:  12/26/2018 FINDINGS: Postop TAVR in satisfactory position. Heart size within normal limits. Negative for heart failure. Lungs are well aerated. Mild atelectasis in the lingula. Right jugular central venous catheter in the lower SVC. No pneumothorax. IMPRESSION: Postop TAVR.  Negative for heart failure Mild atelectasis in the lingula. Electronically Signed   By: Franchot Gallo M.D.   On: 12/30/2018 14:12   Ct Angio Chest Aorta W &/or Wo Contrast  Result Date: 12/10/2018 CLINICAL DATA:   78 year old female with  history of severe aortic stenosis. Preprocedural study prior to potential transcatheter aortic valve replacement (TAVR) procedure. EXAM: CT ANGIOGRAPHY CHEST, ABDOMEN AND PELVIS TECHNIQUE: Multidetector CT imaging through the chest, abdomen and pelvis was performed using the standard protocol during bolus administration of intravenous contrast. Multiplanar reconstructed images and MIPs were obtained and reviewed to evaluate the vascular anatomy. CONTRAST:  157mL ISOVUE-370 IOPAMIDOL (ISOVUE-370) INJECTION 76% COMPARISON:  Cardiac CTA 09/02/2018.  Chest CT 12/24/2016. FINDINGS: CTA CHEST FINDINGS Cardiovascular: Heart size is borderline enlarged. There is no significant pericardial fluid, thickening or pericardial calcification. There is aortic atherosclerosis, as well as atherosclerosis of the great vessels of the mediastinum and the coronary arteries, including calcified atherosclerotic plaque in the left anterior descending coronary artery. Ectasia of ascending thoracic aorta (4.0 cm in diameter). Severe thickening calcification of the aortic valve. Mediastinum/Lymph Nodes: No pathologically enlarged mediastinal or hilar lymph nodes. Esophagus is unremarkable in appearance. No axillary lymphadenopathy. Lungs/Pleura: Multiple small pulmonary nodules scattered throughout the lungs bilaterally measuring 4 mm or less in size. No larger more suspicious appearing pulmonary nodules or masses are noted. No acute consolidative airspace disease. No pleural effusions. Musculoskeletal/Soft Tissues: There are no aggressive appearing lytic or blastic lesions noted in the visualized portions of the skeleton. CTA ABDOMEN AND PELVIS FINDINGS Hepatobiliary: No suspicious cystic or solid hepatic lesions. No intra or extrahepatic biliary ductal dilatation. Gallbladder is normal in appearance. Pancreas: No pancreatic mass. No pancreatic ductal dilatation. No pancreatic or peripancreatic fluid or inflammatory  changes. Spleen: Unremarkable. Adrenals/Urinary Tract: Low-attenuation lesions in both kidneys, compatible with simple cysts, largest of which is in the interpolar region of the left kidney where there is a 2.4 cm low-attenuation peripelvic cyst. No suspicious renal lesions. No hydroureteronephrosis. Urinary bladder is largely obscured by beam hardening artifact from bilateral hip arthroplasties, but visualized portions are normal in appearance. Bilateral adrenal glands are normal in appearance. Stomach/Bowel: Normal appearance of the stomach. No pathologic dilatation of small bowel or colon. Numerous colonic diverticulae are noted, particularly in the sigmoid colon, without surrounding inflammatory changes to suggest an acute diverticulitis at this time. The appendix is not confidently identified and may be surgically absent. Regardless, there are no inflammatory changes noted adjacent to the cecum to suggest the presence of an acute appendicitis at this time. Vascular/Lymphatic: Aortic atherosclerosis, with vascular findings and measurements pertinent to potential TAVR procedure, as detailed below. No aneurysm or dissection noted in the abdominal or pelvic vasculature. No lymphadenopathy noted in the abdomen or pelvis. Reproductive: Uterus and ovaries are partially obscured by beam hardening artifact from the patient's bilateral hip arthroplasties, but are generally unremarkable in appearance. Other: No significant volume of ascites.  No pneumoperitoneum. Musculoskeletal: Postprocedural changes of sacroplasty are noted bilaterally. Old healed fractures of the left superior and inferior pubic rami. Status post bilateral hip arthroplasty. There are no aggressive appearing lytic or blastic lesions noted in the visualized portions of the skeleton. VASCULAR MEASUREMENTS PERTINENT TO TAVR: AORTA: Minimal Aortic Diameter-1.4 x 1.1 mm Severity of Aortic Calcification-moderate to severe RIGHT PELVIS: Right Common Iliac  Artery - Minimal Diameter-7.5 x 6.3 mm Tortuosity-mild Calcification-moderate Right External Iliac Artery - Minimal Diameter-5.7 x 5.3 mm Tortuosity - mild Calcification-mild Right Common Femoral Artery - Minimal Diameter-6.0 x 6.1 mm Tortuosity - mild Calcification-none LEFT PELVIS: Left Common Iliac Artery - Minimal Diameter-6.2 x 6.6 mm Tortuosity - mild Calcification-moderate Left External Iliac Artery - Minimal Diameter-6.1 x 6.3 mm Tortuosity - mild Calcification-none Left Common Femoral Artery - Minimal Diameter-5.7 x  4.9 mm Tortuosity - mild Calcification-mild Review of the MIP images confirms the above findings. IMPRESSION: 1. Vascular findings and measurements pertinent to potential TAVR procedure, as detailed above. 2. Severe thickening calcification of the aortic valve, compatible with the reported clinical history of severe aortic stenosis. 3. Ectasia of the ascending thoracic aorta (4.0 cm in diameter). Recommend annual imaging followup by CTA or MRA. This recommendation follows 2010 ACCF/AHA/AATS/ACR/ASA/SCA/SCAI/SIR/STS/SVM Guidelines for the Diagnosis and Management of Patients with Thoracic Aortic Disease. Circulation. 2010; 121: U765-Y650. Aortic aneurysm NOS (ICD10-I71.9). 4. Multiple small pulmonary nodules measuring 4 mm or less in size scattered throughout the lungs bilaterally. These are nonspecific, but statistically likely benign. No follow-up needed if patient is low-risk (and has no known or suspected primary neoplasm). Non-contrast chest CT can be considered in 12 months if patient is high-risk. This recommendation follows the consensus statement: Guidelines for Management of Incidental Pulmonary Nodules Detected on CT Images: From the Fleischner Society 2017; Radiology 2017; 284:228-243. 5. Colonic diverticulosis without evidence of acute diverticulitis at this time. 6. Additional incidental findings, as above. Electronically Signed   By: Vinnie Langton M.D.   On: 12/10/2018 09:28    Vas US Carotid  Result Date: 12/26/2018 Carotid Arterial Duplex Study Indications:   Pre TAVR. Other Factors: Severe AS. Performing Technologist: Toma Copier RVS  Examination Guidelines: A complete evaluation includes B-mode imaging, spectral Doppler, color Doppler, and power Doppler as needed of all accessible portions of each vessel. Bilateral testing is considered an integral part of a complete examination. Limited examinations for reoccurring indications may be performed as noted.  Right Carotid Findings: +----------+--------+--------+--------+---------------------+------------------+           PSV cm/sEDV cm/sStenosisDescribe             Comments           +----------+--------+--------+--------+---------------------+------------------+ CCA Prox  58      6                                    mild intimal wall                                                         changes            +----------+--------+--------+--------+---------------------+------------------+ CCA Distal61      6                                    mild intimal wall                                                         changes            +----------+--------+--------+--------+---------------------+------------------+ ICA Prox  105     15              heterogenous and     mild to moderate  irregular            plaque at the                                                             origin             +----------+--------+--------+--------+---------------------+------------------+ ICA Mid   72      17                                                      +----------+--------+--------+--------+---------------------+------------------+ ICA Distal88      17                                                      +----------+--------+--------+--------+---------------------+------------------+ ECA       73      1                heterogenous         mild plaque at the                                                        origin             +----------+--------+--------+--------+---------------------+------------------+ +----------+--------+-------+--------+-------------------+           PSV cm/sEDV cmsDescribeArm Pressure (mmHG) +----------+--------+-------+--------+-------------------+ [id number]                                         +----------+--------+-------+--------+-------------------+ +---------+--------+--+--------+-+ VertebralPSV cm/s35EDV cm/s6 +---------+--------+--+--------+-+  Left Carotid Findings: +----------+-------+--------+--------+------------+----------------------------+           PSV    EDV cm/sStenosisDescribe    Comments                               cm/s                                                            +----------+-------+--------+--------+------------+----------------------------+ CCA Prox  79     17                          mild intimal wall changes    +----------+-------+--------+--------+------------+----------------------------+ CCA Distal76     16                          mild ntimal wall changes     +----------+-------+--------+--------+------------+----------------------------+ ICA Prox  114    21  heterogenousmild plaque along the far                                                 wall                         +----------+-------+--------+--------+------------+----------------------------+ ICA Mid   65     18                                                       +----------+-------+--------+--------+------------+----------------------------+ ICA Distal97     24                                                       +----------+-------+--------+--------+------------+----------------------------+ ECA       68     5                                                         +----------+-------+--------+--------+------------+----------------------------+ +----------+--------+--------+--------+-------------------+ SubclavianPSV cm/sEDV cm/sDescribeArm Pressure (mmHG) +----------+--------+--------+--------+-------------------+           125                                         +----------+--------+--------+--------+-------------------+ +---------+--------+--+--------+--+ VertebralPSV cm/s47EDV cm/s10 +---------+--------+--+--------+--+  Summary: Right Carotid: Velocities in the right ICA are consistent with a 1-39% stenosis. Left Carotid: Velocities in the left ICA are consistent with a 1-39% stenosis. Vertebrals:  Bilateral vertebral arteries demonstrate antegrade flow. Subclavians: Normal flow hemodynamics were seen in bilateral subclavian              arteries. *See table(s) above for measurements and observations.  Electronically signed by Servando Snare MD on 12/26/2018 at 12:12:21 PM.    Final    Ct Angio Abdomen Pelvis  W &/or Wo Contrast  Result Date: 12/10/2018 CLINICAL DATA:  78 year old female with history of severe aortic stenosis. Preprocedural study prior to potential transcatheter aortic valve replacement (TAVR) procedure. EXAM: CT ANGIOGRAPHY CHEST, ABDOMEN AND PELVIS TECHNIQUE: Multidetector CT imaging through the chest, abdomen and pelvis was performed using the standard protocol during bolus administration of intravenous contrast. Multiplanar reconstructed images and MIPs were obtained and reviewed to evaluate the vascular anatomy. CONTRAST:  125mL ISOVUE-370 IOPAMIDOL (ISOVUE-370) INJECTION 76% COMPARISON:  Cardiac CTA 09/02/2018.  Chest CT 12/24/2016. FINDINGS: CTA CHEST FINDINGS Cardiovascular: Heart size is borderline enlarged. There is no significant pericardial fluid, thickening or pericardial calcification. There is aortic atherosclerosis, as well as atherosclerosis of the great vessels of the mediastinum and the coronary arteries, including  calcified atherosclerotic plaque in the left anterior descending coronary artery. Ectasia of ascending thoracic aorta (4.0 cm in diameter). Severe thickening calcification of the aortic valve. Mediastinum/Lymph Nodes: No pathologically enlarged mediastinal or hilar lymph nodes. Esophagus is unremarkable  in appearance. No axillary lymphadenopathy. Lungs/Pleura: Multiple small pulmonary nodules scattered throughout the lungs bilaterally measuring 4 mm or less in size. No larger more suspicious appearing pulmonary nodules or masses are noted. No acute consolidative airspace disease. No pleural effusions. Musculoskeletal/Soft Tissues: There are no aggressive appearing lytic or blastic lesions noted in the visualized portions of the skeleton. CTA ABDOMEN AND PELVIS FINDINGS Hepatobiliary: No suspicious cystic or solid hepatic lesions. No intra or extrahepatic biliary ductal dilatation. Gallbladder is normal in appearance. Pancreas: No pancreatic mass. No pancreatic ductal dilatation. No pancreatic or peripancreatic fluid or inflammatory changes. Spleen: Unremarkable. Adrenals/Urinary Tract: Low-attenuation lesions in both kidneys, compatible with simple cysts, largest of which is in the interpolar region of the left kidney where there is a 2.4 cm low-attenuation peripelvic cyst. No suspicious renal lesions. No hydroureteronephrosis. Urinary bladder is largely obscured by beam hardening artifact from bilateral hip arthroplasties, but visualized portions are normal in appearance. Bilateral adrenal glands are normal in appearance. Stomach/Bowel: Normal appearance of the stomach. No pathologic dilatation of small bowel or colon. Numerous colonic diverticulae are noted, particularly in the sigmoid colon, without surrounding inflammatory changes to suggest an acute diverticulitis at this time. The appendix is not confidently identified and may be surgically absent. Regardless, there are no inflammatory changes noted adjacent  to the cecum to suggest the presence of an acute appendicitis at this time. Vascular/Lymphatic: Aortic atherosclerosis, with vascular findings and measurements pertinent to potential TAVR procedure, as detailed below. No aneurysm or dissection noted in the abdominal or pelvic vasculature. No lymphadenopathy noted in the abdomen or pelvis. Reproductive: Uterus and ovaries are partially obscured by beam hardening artifact from the patient's bilateral hip arthroplasties, but are generally unremarkable in appearance. Other: No significant volume of ascites.  No pneumoperitoneum. Musculoskeletal: Postprocedural changes of sacroplasty are noted bilaterally. Old healed fractures of the left superior and inferior pubic rami. Status post bilateral hip arthroplasty. There are no aggressive appearing lytic or blastic lesions noted in the visualized portions of the skeleton. VASCULAR MEASUREMENTS PERTINENT TO TAVR: AORTA: Minimal Aortic Diameter-1.4 x 1.1 mm Severity of Aortic Calcification-moderate to severe RIGHT PELVIS: Right Common Iliac Artery - Minimal Diameter-7.5 x 6.3 mm Tortuosity-mild Calcification-moderate Right External Iliac Artery - Minimal Diameter-5.7 x 5.3 mm Tortuosity - mild Calcification-mild Right Common Femoral Artery - Minimal Diameter-6.0 x 6.1 mm Tortuosity - mild Calcification-none LEFT PELVIS: Left Common Iliac Artery - Minimal Diameter-6.2 x 6.6 mm Tortuosity - mild Calcification-moderate Left External Iliac Artery - Minimal Diameter-6.1 x 6.3 mm Tortuosity - mild Calcification-none Left Common Femoral Artery - Minimal Diameter-5.7 x 4.9 mm Tortuosity - mild Calcification-mild Review of the MIP images confirms the above findings. IMPRESSION: 1. Vascular findings and measurements pertinent to potential TAVR procedure, as detailed above. 2. Severe thickening calcification of the aortic valve, compatible with the reported clinical history of severe aortic stenosis. 3. Ectasia of the ascending thoracic  aorta (4.0 cm in diameter). Recommend annual imaging followup by CTA or MRA. This recommendation follows 2010 ACCF/AHA/AATS/ACR/ASA/SCA/SCAI/SIR/STS/SVM Guidelines for the Diagnosis and Management of Patients with Thoracic Aortic Disease. Circulation. 2010; 121: V400-Q676. Aortic aneurysm NOS (ICD10-I71.9). 4. Multiple small pulmonary nodules measuring 4 mm or less in size scattered throughout the lungs bilaterally. These are nonspecific, but statistically likely benign. No follow-up needed if patient is low-risk (and has no known or suspected primary neoplasm). Non-contrast chest CT can be considered in 12 months if patient is high-risk. This recommendation follows the consensus statement: Guidelines for Management of Incidental Pulmonary  Nodules Detected on CT Images: From the Fleischner Society 2017; Radiology 2017; 284:228-243. 5. Colonic diverticulosis without evidence of acute diverticulitis at this time. 6. Additional incidental findings, as above. Electronically Signed   By: Vinnie Langton M.D.   On: 12/10/2018 09:28   Disposition   Pt is being discharged home today in good condition.  Follow-up Plans & Appointments    Follow-up Information    Eileen Stanford, PA-C. Go on 01/07/2019.   Specialties:  Cardiology, Radiology Why:  @ 1:30pm, please arrive at 1:15pm Contact information: [place] [id number] [phone]            Discharge Medications   Allergies as of 12/31/2018   No Known Allergies     Medication List    STOP taking these medications   amLODipine 10 MG tablet Commonly known as:  NORVASC   ciprofloxacin 500 MG tablet Commonly known as:  CIPRO   mupirocin ointment 2 % Commonly known as:  BACTROBAN   nitroGLYCERIN 0.4 MG SL tablet Commonly known as:  NITROSTAT     TAKE these medications   albuterol 108 (90 Base) MCG/ACT inhaler Commonly known as:  PROVENTIL HFA;VENTOLIN HFA Inhale 2 puffs into the lungs every 6  (six) hours as needed for wheezing.   aspirin EC 81 MG tablet Take 81 mg by mouth daily.   benazepril 20 MG tablet Commonly known as:  LOTENSIN Take 1 tablet (20 mg total) by mouth daily.   CALCIUM 600 + D PO Take 600 mg by mouth 3 (three) times a week.   calcium carbonate 500 MG chewable tablet Commonly known as:  TUMS - dosed in mg elemental calcium Chew 2 tablets by mouth daily as needed for indigestion or heartburn.   clopidogrel 75 MG tablet Commonly known as:  PLAVIX Take 1 tablet (75 mg total) by mouth daily with breakfast. Start taking on:  January 01, 2019   denosumab 60 MG/ML Soln injection Commonly known as:  PROLIA Inject 60 mg into the skin every 6 (six) months. Administer in upper arm, thigh, or abdomen   EPINEPHrine 0.3 mg/0.3 mL Soaj injection Commonly known as:  EPI-PEN Inject 0.3 mg into the muscle as needed for anaphylaxis.   multivitamin with minerals Tabs tablet Take 1 tablet by mouth daily.   NASACORT ALLERGY 24HR 55 MCG/ACT Aero nasal inhaler Generic drug:  triamcinolone Place 1 spray into the nose daily as needed (congestion).   rosuvastatin 40 MG tablet Commonly known as:  CRESTOR TAKE 1 TABLET BY MOUTH EVERY DAY   Vitamin D3 125 MCG (5000 UT) Caps Take 5,000 Units by mouth daily.         Outstanding Labs/Studies   none  Duration of Discharge Encounter   Greater than 30 minutes including physician time.  Mable Fill, PA-C 12/31/2018, 10:48 AM [phone]  Patient seen, examined. Available data reviewed. Agree with findings, assessment, and plan as outlined by Nell Range, PA-C. The patient is independently interviewed and examined. On my exam, heart is RRR with 1/6 SEM at the RUSB. Lungs CTA. Abd: soft, NT. Extremities no edema. Her left foot is cool compared to the right and I am not able to palpate a pulse or obtain a doppler signal. A stat arterial duplex is ordered and Dr [name] is called for vascular consultation. I  appreciate his consultation in advance. Discussed findings with the patient and her daughter.   Sherren Mocha, M.D. 12/31/2018 1:44 PM

## 2018-12-31 NOTE — Anesthesia Preprocedure Evaluation (Addendum)
Anesthesia Evaluation  Patient identified by MRN, date of birth, ID band Patient awake    Reviewed: Allergy & Precautions, NPO status , Patient's Chart, lab work & pertinent test results  Airway Mallampati: II  TM Distance: >3 FB Neck ROM: Full    Dental no notable dental hx.    Pulmonary COPD,  COPD inhaler, former smoker,    Pulmonary exam normal breath sounds clear to auscultation       Cardiovascular hypertension, Pt. on medications Normal cardiovascular exam Rhythm:Regular Rate:Normal  Pulmonary HTN   Pulmonary nodules  ECHO: 1. The left ventricle has hyperdynamic systolic function, with an ejection fraction of >65%. The cavity size was normal. There is moderately increased left ventricular wall thickness. Left ventricular diastolic Doppler parameters are consistent with  impaired relaxation Indeterminent filling pressures No evidence of left ventricular regional wall motion abnormalities. 2. The right ventricle has normal systolic function. The cavity was normal. There is no increase in right ventricular wall thickness. 3. Left atrial size was mildly dilated. 4. The mitral valve is degenerative. Mild calcification of the mitral valve leaflet. 5. The aortic root and ascending aorta are normal in size and structure. 6. The inferior vena cava was dilated in size with <50% respiratory variability. 7. No evidence of left ventricular regional wall motion abnormalities. 8. When compared to the prior study: 10/30/2018 - 60-65%, severe AS.  S/p TAVR   Neuro/Psych negative neurological ROS     GI/Hepatic negative GI ROS, Neg liver ROS,   Endo/Other  negative endocrine ROS  Renal/GU negative Renal ROS     Musculoskeletal negative musculoskeletal ROS (+)   Abdominal   Peds  Hematology  (+) anemia , HLD   Anesthesia Other Findings Left leg ischemic  Reproductive/Obstetrics                             Anesthesia Physical Anesthesia Plan  ASA: IV and emergent  Anesthesia Plan: General   Post-op Pain Management:    Induction: Intravenous and Rapid sequence  PONV Risk Score and Plan: 3 and Ondansetron, Dexamethasone, Midazolam and Treatment may vary due to age or medical condition  Airway Management Planned: Oral ETT  Additional Equipment:   Intra-op Plan:   Post-operative Plan: Extubation in OR  Informed Consent: I have reviewed the patients History and Physical, chart, labs and discussed the procedure including the risks, benefits and alternatives for the proposed anesthesia with the patient or authorized representative who has indicated his/her understanding and acceptance.     Dental advisory given  Plan Discussed with: CRNA  Anesthesia Plan Comments:        Anesthesia Quick Evaluation

## 2018-12-31 NOTE — Anesthesia Procedure Notes (Signed)
Procedure Name: Intubation Date/Time: 12/31/2018 3:30 PM Performed by: Julieta Bellini, CRNA Pre-anesthesia Checklist: Patient identified, Emergency Drugs available, Suction available and Patient being monitored Patient Re-evaluated:Patient Re-evaluated prior to induction Oxygen Delivery Method: Circle system utilized Preoxygenation: Pre-oxygenation with 100% oxygen Induction Type: IV induction, Rapid sequence and Cricoid Pressure applied Laryngoscope Size: Mac and 3 Grade View: Grade I Tube type: Oral Tube size: 7.0 mm Number of attempts: 1 Airway Equipment and Method: Stylet Placement Confirmation: ETT inserted through vocal cords under direct vision,  positive ETCO2 and breath sounds checked- equal and bilateral Secured at: 21 cm Tube secured with: Tape Dental Injury: Teeth and Oropharynx as per pre-operative assessment

## 2018-12-31 NOTE — Op Note (Signed)
    OPERATIVE REPORT  DATE OF SURGERY: 12/31/2018  PATIENT: Dominique [name], 78 y.o. female MRN: [id number]  DOB: 10-06-1941  PRE-OPERATIVE DIAGNOSIS: Ischemic left leg followingTAVR  POST-OPERATIVE DIAGNOSIS:  Same  PROCEDURE: Repair of left femoral artery with endarterectomy and Dacron patch angioplasty  SURGEON:  Curt Jews, M.D.  PHYSICIAN ASSISTANT: Matt Eveland, PA-C  ANESTHESIA: General  EBL: per anesthesia record  Total I/O In: 123 [P.O.:120; I.V.:3] Out: 625 [Urine:475; Blood:150]  BLOOD ADMINISTERED: none  DRAINS: none  SPECIMEN: none  COUNTS CORRECT:  YES  PATIENT DISPOSITION:  PACU - hemodynamically stable  PROCEDURE DETAILS: The patient was taken to the operating placed supine position where the area of the left groin and thigh were prepped and draped in usual sterile fashion.  An incision was made over the groin puncture and carried down to isolate the common femoral artery.  The area of the Perclose device closure did appear to cause narrowing in the common femoral artery.  There also appeared to be dissection in the common femoral artery proximally up under the inguinal ligament with a bluish discoloration of the artery.  The artery was exposed up onto the external iliac artery under the inguinal ligament and also distally below the level of the access site.  The patient was given 7000 units of intravenous heparin and after adequate circulation time the external iliac artery was occluded under the inguinal ligament with a Henley clamp and the common femoral artery was occluded distally with a baby Gregory clamp.  The artery was opened longitudinally through the access site and the Perclose devices were removed.  There was posterior plaque that was extensively dissected up under the inguinal ligament.  There was mural thrombus between the dissection flap and the adventitia.  The entire area of dissection was exposed with a longitudinal incision and the median intima  and thrombus were removed.  The arteriotomy was continued past the level of the access site.  The posterior plaque did extend further distally and the artery was exposed further distally.  The distal plaque was endarterectomized with a nice feathering endpoint.  Remaining a debris was removed from the endarterectomy plane.  A Finesse Hemashield Dacron patch was brought onto the field and was sewn as a patch angioplasty with a running 6-0 Prolene suture.  Several additional sutures were required for hemostasis.  Clamps were removed and good flow was noted.  The patient had return of a palpable dorsalis pedis and posterior tibial pulse.  The patient was given 50 mg of protamine to reverse the heparin.  Wounds irrigated with saline.  Hemostasis obtained with cautery.  The wounds were closed with 2-0 Vicryl in several layers in the cutaneous tissue.  Skin was closed with a 3-0 subcuticular Vicryl suture.  Sterile dressing was applied the patient was transferred to the recovery room in stable condition   Rosetta Posner, M.D., [facility] 12/31/2018 5:39 PM

## 2018-12-31 NOTE — Progress Notes (Signed)
  Echocardiogram 2D Echocardiogram has been performed.  Dominique [name] 12/31/2018, 8:42 AM

## 2018-12-31 NOTE — Progress Notes (Signed)
12/31/2018 11:37 AM Pt ambulated 440 ft on RA without any assistance.  Pt did complain of pain in her left leg while ambulating and some slight numbness in the foot.  Once back in the bed the pain and numbness goes away.  Will make Dr. [name] aware when he comes to see pt before discharge.  Carney Corners

## 2018-12-31 NOTE — Progress Notes (Signed)
12/31/2018 1830 Received pt back to room 4E-01 from OR.  Pt is A&O, no C/O voiced.  Tele monitor applied and CCMD notified.  CHG bath given.  Oriented to room, call light and bed.  Call bell in reach. Carney Corners

## 2018-12-31 NOTE — Progress Notes (Signed)
12/31/2018  10:06 AM [facility] [place] D/C'D.  Pt tolerated well.   Carney Corners

## 2018-12-31 NOTE — Anesthesia Postprocedure Evaluation (Signed)
Anesthesia Post Note  Patient: Dominique [name]  Procedure(s) Performed: PATCH ANGIOPLASTY REPAIR LEFT FEMORAL ARTERY USING HEMASHIELD PLATINUM FINESSE PATCH (Left )     Patient location during evaluation: PACU Anesthesia Type: General Level of consciousness: awake and alert Pain management: pain level controlled Vital Signs Assessment: post-procedure vital signs reviewed and stable Respiratory status: spontaneous breathing, nonlabored ventilation, respiratory function stable and patient connected to nasal cannula oxygen Cardiovascular status: blood pressure returned to baseline and stable Postop Assessment: no apparent nausea or vomiting Anesthetic complications: no    Last Vitals:  Vitals:   12/31/18 1842 12/31/18 2006  BP: 119/72 (!) 133/56  Pulse: 88 82  Resp: 17 (!) 22  Temp: 36.5 C 36.4 C  SpO2: 97% 98%    Last Pain:  Vitals:   12/31/18 2006  TempSrc: Oral  PainSc:                  Karyl Kinnier Tumeka Chimenti

## 2019-01-01 ENCOUNTER — Encounter (HOSPITAL_COMMUNITY): Payer: Self-pay | Admitting: Vascular Surgery

## 2019-01-01 DIAGNOSIS — I998 Other disorder of circulatory system: Secondary | ICD-10-CM

## 2019-01-01 LAB — BASIC METABOLIC PANEL
Anion gap: 8 (ref 5–15)
BUN: 11 mg/dL (ref 8–23)
CO2: 25 mmol/L (ref 22–32)
Calcium: 8.2 mg/dL — ABNORMAL LOW (ref 8.9–10.3)
Chloride: 106 mmol/L (ref 98–111)
Creatinine, Ser: 0.64 mg/dL (ref 0.44–1.00)
GFR calc Af Amer: >60 mL/min (ref >60)
GFR calc non Af Amer: >60 mL/min (ref >60)
Glucose, Bld: 127 mg/dL — ABNORMAL HIGH (ref 70–99)
Potassium: 4.1 mmol/L (ref 3.5–5.1)
Sodium: 139 mmol/L (ref 135–145)

## 2019-01-01 LAB — CBC
HCT: 32.1 % — ABNORMAL LOW (ref 36.0–46.0)
Hemoglobin: 10.5 g/dL — ABNORMAL LOW (ref 12.0–15.0)
MCH: 30.3 pg (ref 26.0–34.0)
MCHC: 32.7 g/dL (ref 30.0–36.0)
MCV: 92.8 fL (ref 80.0–100.0)
Platelets: 202 10*3/uL (ref 150–400)
RBC: 3.46 MIL/uL — ABNORMAL LOW (ref 3.87–5.11)
RDW: 14.3 % (ref 11.5–15.5)
WBC: 14.8 10*3/uL — ABNORMAL HIGH (ref 4.0–10.5)
nRBC: 0 % (ref 0.0–0.2)

## 2019-01-01 MED ORDER — TRAMADOL HCL 50 MG PO TABS: 100.0000 mg | ORAL_TABLET | ORAL | 0 refills | Status: DC | PRN

## 2019-01-01 NOTE — Progress Notes (Addendum)
Pt ambulated 50 feet and tolerated well prior to receiving discharge instructions and education. Pt iv removed and intact. Vitals stable. Telebox removed/CCMD notified. Pt has all belongings. Pt has good pulse to LLE, warm to touch. Pt denies any complaints. Volunteers called to tx pt via wheelchair to meet ride.  Jerald Kief, RN

## 2019-01-01 NOTE — Progress Notes (Signed)
CARDIAC REHAB PHASE I   Offered to walk with pt. Pt declining at this time, states she is trying to rest a little longer this morning. Encouraged pt to walk before d/c to make sure she feels ok. Pt states numb feeling in her foot is much better today. Pt declining walker for home use. Encouraged ambulation as able. Pt referred to [facility] [place].  [id number] Dominique Falco, RN BSN 01/01/2019 10:39 AM

## 2019-01-01 NOTE — Discharge Summary (Addendum)
[facility] TEAM  Discharge Summary    Patient ID: Dominique [name] MRN: [id number]; DOB: [birth year]  Admit date: 12/30/2018 Discharge date: 01/01/2019  Primary Care Provider: Colon Branch, MD  Primary Cardiologist: Dr. [name] / Dr. [name] & Dr. Cyndia Bent (TAVR)  Discharge Diagnoses    Principal Problem:   S/P TAVR (transcatheter aortic valve replacement) Active Problems:   Severe aortic stenosis   Essential hypertension   Hyperlipidemia   COPD GOLD II   Multiple lung nodules   CAD (coronary artery disease), native coronary artery   Thoracic ascending aortic aneurysm (HCC)   Ischemic leg   Allergies No Known Allergies  Diagnostic Studies/Procedures    TAVR OPERATIVE NOTE  Date of Procedure:12/30/2018  Preoperative Diagnosis:Severe Aortic Stenosis   Postoperative Diagnosis:Same   Procedure:   Transcatheter Aortic Valve Replacement - PercutaneousRightTransfemoral Approach Medtronic CoreValve Evolut Pro +(size 60mm, serial # [id number])  Co-Surgeons:Bryan Alveria Apley, MD and Sherren Mocha, MD   Anesthesiologist:Ryan Roanna Banning, MD  Dala Dock, MD  Pre-operative Echo Findings: ? Severe aortic stenosis ? Normal left ventricular systolic function  Post-operative Echo Findings: ? Noparavalvular leak ? Normal left ventricular systolic function  _____________   Echo 12/31/18 IMPRESSIONS 1. The left ventricle has hyperdynamic systolic function, with an ejection fraction of >65%. The cavity size was normal. There is moderately increased left ventricular wall thickness. Left ventricular diastolic Doppler parameters are consistent with  impaired relaxation Indeterminent filling pressures No evidence of left ventricular regional wall motion abnormalities. 2. The right ventricle has normal  systolic function. The cavity was normal. There is no increase in right ventricular wall thickness. 3. Left atrial size was mildly dilated. 4. The mitral valve is degenerative. Mild calcification of the mitral valve leaflet. 5. The aortic root and ascending aorta are normal in size and structure. 6. The inferior vena cava was dilated in size with <50% respiratory variability. 7. No evidence of left ventricular regional wall motion abnormalities. 8. When compared to the prior study: 10/30/2018 - 60-65%, severe AS.  Aortic Valve: The aortic valve has been repaired/replaced Aortic valve regurgitation was not visualized by color flow Doppler. There is no evidence of aortic valve stenosis.  _____________   Arterial dopplers 12/31/2018 Summary: Left: Left common femoral artery is occluded, appartent flap noted. Monophasic flow is noted distal to obstruction.   History of Present Illness     Dominique [name] is a 78 y.o. female with a history of HTN, HLD, COPD and severe AS who presented to [facility] on 12/30[facility]18 for planned TAVR.  She said that she was told as a child in [place] that she had some abnormality of her aortic valve. An echocardiogram in 05/2017 showed what appeared to be a trileaflet aortic valve with a mean gradient of 21 mmHg consistent with moderate stenosis. She had a follow-up echocardiogram in 05/2018 which showed an increase in the mean gradient to 36 mmHg with normal left ventricular function but she remained asymptomatic. She now presents with a several month history of progressive exertional fatigue and shortness of breath. A repeat echocardiogram on 10/31/2018 shows a trileaflet aortic valve with moderate thickening and calcification of the leaflets with restricted mobility. The mean gradient increased further to 39 mmHg with a peak gradient of 64 mmHg. There is no regurgitation. Aortic valve area was measured at 0.88 cm. Left ventricular ejection fraction was 60 to 65% with  grade 1 diastolic dysfunction. She underwent cardiac catheterization on 11/27/2018 showing normal  coronary arteries. The mean gradient across aortic valve was measured at 17 mmHg with with a calculated valve area of 1.47 cm. Right heart pressures were normal.   The patient has been evaluated by the multidisciplinary valve team and felt to have severe, symptomatic aortic stenosis and to be a suitable candidate for TAVR, which was set up for 12/30/18. Given small annular size it was planned to use a Medtronic Evolut Pro.    Hospital Course     Consultants: vascular surgery   Severe AS:s/p successful TAVR with a35mm Medtronic CoreValve Evolut Provia the TF approach on3/12/2018. Post operative echoshowed EF 65%, normally functioning TAVR with mean gradient of 6 mm Hg and no PVL.Groin sites are stable. ECG with sinusand no high grade heart block. Continue Asprin andplavix.  HTN: BP has been in the normal range. I have resumed home Benazapril but holding amlodipine 10mg  daily at discharge.   HLD: continue statin   COPD: stable.  Ischemic left leg: patient complained of exertional left leg and foot pain. She was noted to have a cool foot and L doppler pulse was absent. STAT arterial duplex showed no flow in femoral artery. Dr. [name] took her for emergency repair of left femoral artery with endarterectomy and Dacron patch angioplasty. Continue plavix. Outpatient [facility] follow up will be arranged. She requested tramadol for the incision pain. I have called this into her pharmacy  Pulmonary nodule: seen on pre TAVR CT scan. This will required 12 month follow up CT. I will discuss this as an outpatient.  Thoracic ascending aortic aneurysm: (40 mm) seen on pre TAVR CT scan. This will required 12 month follow up CT. I will discuss this as an outpatient. _____________  Discharge Vitals Blood pressure 111/60, pulse 70, temperature (!) 97.3 F (36.3 C), temperature source Oral, resp. rate  20, height 5\' 6"  (1.676 m), weight 73.9 kg, SpO2 98 %.  Filed Weights   12/30/18 1400 12/31/18 0418 01/01/19 0440  Weight: 71.2 kg 74.1 kg 73.9 kg   VS:  BP 111/60 (BP Location: Right Arm)   Pulse 70   Temp (!) 97.3 F (36.3 C) (Oral)   Resp 20   Ht 5\' 6"  (1.676 m)   Wt 73.9 kg   SpO2 98%   BMI 26.31 kg/m    GEN: Well nourished, well developed, in no acute distress HEENT: normal Neck: no JVD or masses Cardiac: RRR; soft flow murmur. No rubs, or gallops,no edema  Respiratory:  clear to auscultation bilaterally, normal work of breathing GI: soft, nontender, nondistended, + BS MS: no deformity or atrophy Skin: warm and dry, no rash. Groin sites healing well. Sutures in left leg with no erythema or drainage. Left leg warm  Neuro:  Alert and Oriented x 3, Strength and sensation are intact Psych: euthymic mood, full affect   Labs & Radiologic Studies    CBC Recent Labs    12/31/18 0500 01/01/19 0407  WBC 14.2* 14.8*  HGB 11.1* 10.5*  HCT 35.0* 32.1*  MCV 92.8 92.8  PLT 237 211   Basic Metabolic Panel Recent Labs    12/31/18 0500 01/01/19 0407  NA 139 139  K 3.8 4.1  CL 110 106  CO2 23 25  GLUCOSE 121* 127*  BUN 12 11  CREATININE 0.68 0.64  CALCIUM 8.4* 8.2*  MG 1.6*  --    Liver Function Tests No results for input(s): AST, ALT, ALKPHOS, BILITOT, PROT, ALBUMIN in the last 72 hours. No results for input(s): LIPASE, AMYLASE  in the last 72 hours. Cardiac Enzymes No results for input(s): CKTOTAL, CKMB, CKMBINDEX, TROPONINI in the last 72 hours. BNP Invalid input(s): POCBNP D-Dimer No results for input(s): DDIMER in the last 72 hours. Hemoglobin A1C No results for input(s): HGBA1C in the last 72 hours. Fasting Lipid Panel No results for input(s): CHOL, HDL, LDLCALC, TRIG, CHOLHDL, LDLDIRECT in the last 72 hours. Thyroid Function Tests No results for input(s): TSH, T4TOTAL, T3FREE, THYROIDAB in the last 72 hours.  Invalid input(s): FREET3 _____________  Dg  Chest 2 View  Result Date: 12/26/2018 CLINICAL DATA:  Pre-op for heart surgery - aortic stenosis - hx of exsmoker, emphysema, pulm htn EXAM: CHEST - 2 VIEW COMPARISON:  None. FINDINGS: Normal mediastinum and cardiac silhouette. Normal pulmonary vasculature. No evidence of effusion, infiltrate, or pneumothorax. No acute bony abnormality. IMPRESSION: No acute cardiopulmonary process. Electronically Signed   By: Suzy Bouchard M.D.   On: 12/26/2018 15:22   Ct Coronary Morph W/cta Cor W/score W/ca W/cm &/or Wo/cm  Addendum Date: 12/10/2018   ADDENDUM REPORT: 12/10/2018 07:52 EXAM: OVER-READ INTERPRETATION  CT CHEST The following report is an over-read performed by radiologist Dr. Rebekah Chesterfield [facility] on 12/10/2018. This over-read does not include interpretation of cardiac or coronary anatomy or pathology. The calcium score and cardiac CTA interpretation by the cardiologist is attached. COMPARISON:  Cardiac CTA 09/02/2018. FINDINGS: Extracardiac findings will be described separately under dictation for contemporaneously obtained CTA chest, abdomen and pelvis. IMPRESSION: Please see separate dictation for contemporaneously obtained CTA chest, abdomen and pelvis dated 12/09/2018 for full description of relevant extracardiac findings. Electronically Signed   By: Vinnie Langton M.D.   On: 12/10/2018 07:52   Result Date: 12/10/2018 CLINICAL DATA:  Aortic Stenosis EXAM: Cardiac TAVR CT TECHNIQUE: The patient was scanned on a Siemens Force 834 slice scanner. A 120 kV retrospective scan was triggered in the ascending thoracic aorta at 140 HU's. Gantry rotation speed was 250 msecs and collimation was .6 mm. No beta blockade or nitro were given. The 3D data set was reconstructed in 5% intervals of the R-R cycle. Systolic and diastolic phases were analyzed on a dedicated work station using MPR, MIP and VRT modes. The patient received 80 cc of contrast. FINDINGS: Aortic Valve: Functionally bicuspid  with fusion of left and righ cusps Aorta: Dilated 3.8 cm normal arch vessels no coarctation Sino-tubular Junction: 27 mm Ascending Thoracic Aorta: 38 mm Aortic Arch: 21 mm Descending Thoracic Aorta: 23 mm Sinus of Valsalva Measurements: Non-coronary: 27.3 mm Right - coronary: 25.8 mm Left -   coronary: 27.8 mm Coronary Artery Height above Annulus: Left Main: 13.3 mm above annulus Right Coronary: 11.2 mm above annulus Virtual Basal Annulus Measurements: Maximum / Minimum Diameter: 21.3 mm x 19.7 mm Perimeter: 66 mm Area: 330 mm2 Coronary Arteries: Sufficient height above annulus for deployment Optimum Fluoroscopic Angle for Delivery: LAO 17 Caudal 23 degrees IMPRESSION: 1. Functionally bicuspid AV with annular area of 330 mm2 suitable for a 20 mm Sapien 3 valve 2.  Mild aortic root dilatation 3.8 cm 3.  Coronary arteries sufficient height above annulus for deployment 4. Optimum angiographic angle for deployment LAO 17 degrees Caudal 23 degrees Jenkins Rouge Electronically Signed: By: Jenkins Rouge M.D. On: 12/09/2018 14:18   Dg Chest Port 1 View  Result Date: 12/30/2018 CLINICAL DATA:  TAVR EXAM: PORTABLE CHEST 1 VIEW COMPARISON:  12/26/2018 FINDINGS: Postop TAVR in satisfactory position. Heart size within normal limits. Negative for heart failure. Lungs are well aerated.  Mild atelectasis in the lingula. Right jugular central venous catheter in the lower SVC. No pneumothorax. IMPRESSION: Postop TAVR.  Negative for heart failure Mild atelectasis in the lingula. Electronically Signed   By: Franchot Gallo M.D.   On: 12/30/2018 14:12   Ct Angio Chest Aorta W &/or Wo Contrast  Result Date: 12/10/2018 CLINICAL DATA:  78 year old female with history of severe aortic stenosis. Preprocedural study prior to potential transcatheter aortic valve replacement (TAVR) procedure. EXAM: CT ANGIOGRAPHY CHEST, ABDOMEN AND PELVIS TECHNIQUE: Multidetector CT imaging through the chest, abdomen and pelvis was performed using the  standard protocol during bolus administration of intravenous contrast. Multiplanar reconstructed images and MIPs were obtained and reviewed to evaluate the vascular anatomy. CONTRAST:  134mL ISOVUE-370 IOPAMIDOL (ISOVUE-370) INJECTION 76% COMPARISON:  Cardiac CTA 09/02/2018.  Chest CT 12/24/2016. FINDINGS: CTA CHEST FINDINGS Cardiovascular: Heart size is borderline enlarged. There is no significant pericardial fluid, thickening or pericardial calcification. There is aortic atherosclerosis, as well as atherosclerosis of the great vessels of the mediastinum and the coronary arteries, including calcified atherosclerotic plaque in the left anterior descending coronary artery. Ectasia of ascending thoracic aorta (4.0 cm in diameter). Severe thickening calcification of the aortic valve. Mediastinum/Lymph Nodes: No pathologically enlarged mediastinal or hilar lymph nodes. Esophagus is unremarkable in appearance. No axillary lymphadenopathy. Lungs/Pleura: Multiple small pulmonary nodules scattered throughout the lungs bilaterally measuring 4 mm or less in size. No larger more suspicious appearing pulmonary nodules or masses are noted. No acute consolidative airspace disease. No pleural effusions. Musculoskeletal/Soft Tissues: There are no aggressive appearing lytic or blastic lesions noted in the visualized portions of the skeleton. CTA ABDOMEN AND PELVIS FINDINGS Hepatobiliary: No suspicious cystic or solid hepatic lesions. No intra or extrahepatic biliary ductal dilatation. Gallbladder is normal in appearance. Pancreas: No pancreatic mass. No pancreatic ductal dilatation. No pancreatic or peripancreatic fluid or inflammatory changes. Spleen: Unremarkable. Adrenals/Urinary Tract: Low-attenuation lesions in both kidneys, compatible with simple cysts, largest of which is in the interpolar region of the left kidney where there is a 2.4 cm low-attenuation peripelvic cyst. No suspicious renal lesions. No hydroureteronephrosis.  Urinary bladder is largely obscured by beam hardening artifact from bilateral hip arthroplasties, but visualized portions are normal in appearance. Bilateral adrenal glands are normal in appearance. Stomach/Bowel: Normal appearance of the stomach. No pathologic dilatation of small bowel or colon. Numerous colonic diverticulae are noted, particularly in the sigmoid colon, without surrounding inflammatory changes to suggest an acute diverticulitis at this time. The appendix is not confidently identified and may be surgically absent. Regardless, there are no inflammatory changes noted adjacent to the cecum to suggest the presence of an acute appendicitis at this time. Vascular/Lymphatic: Aortic atherosclerosis, with vascular findings and measurements pertinent to potential TAVR procedure, as detailed below. No aneurysm or dissection noted in the abdominal or pelvic vasculature. No lymphadenopathy noted in the abdomen or pelvis. Reproductive: Uterus and ovaries are partially obscured by beam hardening artifact from the patient's bilateral hip arthroplasties, but are generally unremarkable in appearance. Other: No significant volume of ascites.  No pneumoperitoneum. Musculoskeletal: Postprocedural changes of sacroplasty are noted bilaterally. Old healed fractures of the left superior and inferior pubic rami. Status post bilateral hip arthroplasty. There are no aggressive appearing lytic or blastic lesions noted in the visualized portions of the skeleton. VASCULAR MEASUREMENTS PERTINENT TO TAVR: AORTA: Minimal Aortic Diameter-1.4 x 1.1 mm Severity of Aortic Calcification-moderate to severe RIGHT PELVIS: Right Common Iliac Artery - Minimal Diameter-7.5 x 6.3 mm Tortuosity-mild Calcification-moderate Right External Iliac  Artery - Minimal Diameter-5.7 x 5.3 mm Tortuosity - mild Calcification-mild Right Common Femoral Artery - Minimal Diameter-6.0 x 6.1 mm Tortuosity - mild Calcification-none LEFT PELVIS: Left Common Iliac  Artery - Minimal Diameter-6.2 x 6.6 mm Tortuosity - mild Calcification-moderate Left External Iliac Artery - Minimal Diameter-6.1 x 6.3 mm Tortuosity - mild Calcification-none Left Common Femoral Artery - Minimal Diameter-5.7 x 4.9 mm Tortuosity - mild Calcification-mild Review of the MIP images confirms the above findings. IMPRESSION: 1. Vascular findings and measurements pertinent to potential TAVR procedure, as detailed above. 2. Severe thickening calcification of the aortic valve, compatible with the reported clinical history of severe aortic stenosis. 3. Ectasia of the ascending thoracic aorta (4.0 cm in diameter). Recommend annual imaging followup by CTA or MRA. This recommendation follows 2010 ACCF/AHA/AATS/ACR/ASA/SCA/SCAI/SIR/STS/SVM Guidelines for the Diagnosis and Management of Patients with Thoracic Aortic Disease. Circulation. 2010; 121: Y606-T016. Aortic aneurysm NOS (ICD10-I71.9). 4. Multiple small pulmonary nodules measuring 4 mm or less in size scattered throughout the lungs bilaterally. These are nonspecific, but statistically likely benign. No follow-up needed if patient is low-risk (and has no known or suspected primary neoplasm). Non-contrast chest CT can be considered in 12 months if patient is high-risk. This recommendation follows the consensus statement: Guidelines for Management of Incidental Pulmonary Nodules Detected on CT Images: From the Fleischner Society 2017; Radiology 2017; 284:228-243. 5. Colonic diverticulosis without evidence of acute diverticulitis at this time. 6. Additional incidental findings, as above. Electronically Signed   By: Vinnie Langton M.D.   On: 12/10/2018 09:28   Vas US Carotid  Result Date: 12/26/2018 Carotid Arterial Duplex Study Indications:   Pre TAVR. Other Factors: Severe AS. Performing Technologist: Toma Copier RVS  Examination Guidelines: A complete evaluation includes B-mode imaging, spectral Doppler, color Doppler, and power Doppler as  needed of all accessible portions of each vessel. Bilateral testing is considered an integral part of a complete examination. Limited examinations for reoccurring indications may be performed as noted.  Right Carotid Findings: +----------+--------+--------+--------+---------------------+------------------+           PSV cm/sEDV cm/sStenosisDescribe             Comments           +----------+--------+--------+--------+---------------------+------------------+ CCA Prox  58      6                                    mild intimal wall                                                         changes            +----------+--------+--------+--------+---------------------+------------------+ CCA Distal61      6                                    mild intimal wall                                                         changes            +----------+--------+--------+--------+---------------------+------------------+  ICA Prox  105     15              heterogenous and     mild to moderate                                     irregular            plaque at the                                                             origin             +----------+--------+--------+--------+---------------------+------------------+ ICA Mid   72      17                                                      +----------+--------+--------+--------+---------------------+------------------+ ICA Distal88      17                                                      +----------+--------+--------+--------+---------------------+------------------+ ECA       73      1               heterogenous         mild plaque at the                                                        origin             +----------+--------+--------+--------+---------------------+------------------+ +----------+--------+-------+--------+-------------------+           PSV cm/sEDV cmsDescribeArm  Pressure (mmHG) +----------+--------+-------+--------+-------------------+ [id number]                                         +----------+--------+-------+--------+-------------------+ +---------+--------+--+--------+-+ VertebralPSV cm/s35EDV cm/s6 +---------+--------+--+--------+-+  Left Carotid Findings: +----------+-------+--------+--------+------------+----------------------------+           PSV    EDV cm/sStenosisDescribe    Comments                               cm/s                                                            +----------+-------+--------+--------+------------+----------------------------+ CCA Prox  79     17  mild intimal wall changes    +----------+-------+--------+--------+------------+----------------------------+ CCA Distal76     16                          mild ntimal wall changes     +----------+-------+--------+--------+------------+----------------------------+ ICA Prox  114    21              heterogenousmild plaque along the far                                                 wall                         +----------+-------+--------+--------+------------+----------------------------+ ICA Mid   65     18                                                       +----------+-------+--------+--------+------------+----------------------------+ ICA Distal97     24                                                       +----------+-------+--------+--------+------------+----------------------------+ ECA       68     5                                                        +----------+-------+--------+--------+------------+----------------------------+ +----------+--------+--------+--------+-------------------+ SubclavianPSV cm/sEDV cm/sDescribeArm Pressure (mmHG) +----------+--------+--------+--------+-------------------+           125                                          +----------+--------+--------+--------+-------------------+ +---------+--------+--+--------+--+ VertebralPSV cm/s47EDV cm/s10 +---------+--------+--+--------+--+  Summary: Right Carotid: Velocities in the right ICA are consistent with a 1-39% stenosis. Left Carotid: Velocities in the left ICA are consistent with a 1-39% stenosis. Vertebrals:  Bilateral vertebral arteries demonstrate antegrade flow. Subclavians: Normal flow hemodynamics were seen in bilateral subclavian              arteries. *See table(s) above for measurements and observations.  Electronically signed by Servando Snare MD on 12/26/2018 at 12:12:21 PM.    Final    Vas [place] Lower Extremity Arterial Duplex  Result Date: 12/31/2018 LOWER EXTREMITY ARTERIAL DUPLEX STUDY Indications: Left cold foot post left groin stick for TAVR.  Current ABI: n/a Performing Technologist: June Leap RDMS, RVT  Examination Guidelines: A complete evaluation includes B-mode imaging, spectral Doppler, color Doppler, and power Doppler as needed of all accessible portions of each vessel. Bilateral testing is considered an integral part of a complete examination. Limited examinations for reoccurring indications may be performed as noted.  Left Duplex Findings: +--------+--------+-----+--------+-------------------+--------+         PSV cm/sRatioStenosisWaveform  Comments +--------+--------+-----+--------+-------------------+--------+ CFA Prox20                   monophasic                  +--------+--------+-----+--------+-------------------+--------+ SFA Prox                     monophasic                  +--------+--------+-----+--------+-------------------+--------+ PTA Prox10                   dampened monophasic         +--------+--------+-----+--------+-------------------+--------+  Summary: Left: Left common femoral artery is occluded, appartent flap noted. Monophasic flow is noted distal to obstruction.  See table(s) above for  measurements and observations. Electronically signed by Curt Jews MD on 12/31/2018 at 2:36:04 PM.    Final    Ct Angio Abdomen Pelvis  W &/or Wo Contrast  Result Date: 12/10/2018 CLINICAL DATA:  78 year old female with history of severe aortic stenosis. Preprocedural study prior to potential transcatheter aortic valve replacement (TAVR) procedure. EXAM: CT ANGIOGRAPHY CHEST, ABDOMEN AND PELVIS TECHNIQUE: Multidetector CT imaging through the chest, abdomen and pelvis was performed using the standard protocol during bolus administration of intravenous contrast. Multiplanar reconstructed images and MIPs were obtained and reviewed to evaluate the vascular anatomy. CONTRAST:  165mL ISOVUE-370 IOPAMIDOL (ISOVUE-370) INJECTION 76% COMPARISON:  Cardiac CTA 09/02/2018.  Chest CT 12/24/2016. FINDINGS: CTA CHEST FINDINGS Cardiovascular: Heart size is borderline enlarged. There is no significant pericardial fluid, thickening or pericardial calcification. There is aortic atherosclerosis, as well as atherosclerosis of the great vessels of the mediastinum and the coronary arteries, including calcified atherosclerotic plaque in the left anterior descending coronary artery. Ectasia of ascending thoracic aorta (4.0 cm in diameter). Severe thickening calcification of the aortic valve. Mediastinum/Lymph Nodes: No pathologically enlarged mediastinal or hilar lymph nodes. Esophagus is unremarkable in appearance. No axillary lymphadenopathy. Lungs/Pleura: Multiple small pulmonary nodules scattered throughout the lungs bilaterally measuring 4 mm or less in size. No larger more suspicious appearing pulmonary nodules or masses are noted. No acute consolidative airspace disease. No pleural effusions. Musculoskeletal/Soft Tissues: There are no aggressive appearing lytic or blastic lesions noted in the visualized portions of the skeleton. CTA ABDOMEN AND PELVIS FINDINGS Hepatobiliary: No suspicious cystic or solid hepatic lesions. No intra  or extrahepatic biliary ductal dilatation. Gallbladder is normal in appearance. Pancreas: No pancreatic mass. No pancreatic ductal dilatation. No pancreatic or peripancreatic fluid or inflammatory changes. Spleen: Unremarkable. Adrenals/Urinary Tract: Low-attenuation lesions in both kidneys, compatible with simple cysts, largest of which is in the interpolar region of the left kidney where there is a 2.4 cm low-attenuation peripelvic cyst. No suspicious renal lesions. No hydroureteronephrosis. Urinary bladder is largely obscured by beam hardening artifact from bilateral hip arthroplasties, but visualized portions are normal in appearance. Bilateral adrenal glands are normal in appearance. Stomach/Bowel: Normal appearance of the stomach. No pathologic dilatation of small bowel or colon. Numerous colonic diverticulae are noted, particularly in the sigmoid colon, without surrounding inflammatory changes to suggest an acute diverticulitis at this time. The appendix is not confidently identified and may be surgically absent. Regardless, there are no inflammatory changes noted adjacent to the cecum to suggest the presence of an acute appendicitis at this time. Vascular/Lymphatic: Aortic atherosclerosis, with vascular findings and measurements pertinent to potential TAVR procedure, as detailed below. No aneurysm or dissection noted in the abdominal or pelvic vasculature. No lymphadenopathy  noted in the abdomen or pelvis. Reproductive: Uterus and ovaries are partially obscured by beam hardening artifact from the patient's bilateral hip arthroplasties, but are generally unremarkable in appearance. Other: No significant volume of ascites.  No pneumoperitoneum. Musculoskeletal: Postprocedural changes of sacroplasty are noted bilaterally. Old healed fractures of the left superior and inferior pubic rami. Status post bilateral hip arthroplasty. There are no aggressive appearing lytic or blastic lesions noted in the visualized  portions of the skeleton. VASCULAR MEASUREMENTS PERTINENT TO TAVR: AORTA: Minimal Aortic Diameter-1.4 x 1.1 mm Severity of Aortic Calcification-moderate to severe RIGHT PELVIS: Right Common Iliac Artery - Minimal Diameter-7.5 x 6.3 mm Tortuosity-mild Calcification-moderate Right External Iliac Artery - Minimal Diameter-5.7 x 5.3 mm Tortuosity - mild Calcification-mild Right Common Femoral Artery - Minimal Diameter-6.0 x 6.1 mm Tortuosity - mild Calcification-none LEFT PELVIS: Left Common Iliac Artery - Minimal Diameter-6.2 x 6.6 mm Tortuosity - mild Calcification-moderate Left External Iliac Artery - Minimal Diameter-6.1 x 6.3 mm Tortuosity - mild Calcification-none Left Common Femoral Artery - Minimal Diameter-5.7 x 4.9 mm Tortuosity - mild Calcification-mild Review of the MIP images confirms the above findings. IMPRESSION: 1. Vascular findings and measurements pertinent to potential TAVR procedure, as detailed above. 2. Severe thickening calcification of the aortic valve, compatible with the reported clinical history of severe aortic stenosis. 3. Ectasia of the ascending thoracic aorta (4.0 cm in diameter). Recommend annual imaging followup by CTA or MRA. This recommendation follows 2010 ACCF/AHA/AATS/ACR/ASA/SCA/SCAI/SIR/STS/SVM Guidelines for the Diagnosis and Management of Patients with Thoracic Aortic Disease. Circulation. 2010; 121: X914-N829. Aortic aneurysm NOS (ICD10-I71.9). 4. Multiple small pulmonary nodules measuring 4 mm or less in size scattered throughout the lungs bilaterally. These are nonspecific, but statistically likely benign. No follow-up needed if patient is low-risk (and has no known or suspected primary neoplasm). Non-contrast chest CT can be considered in 12 months if patient is high-risk. This recommendation follows the consensus statement: Guidelines for Management of Incidental Pulmonary Nodules Detected on CT Images: From the Fleischner Society 2017; Radiology 2017; 284:228-243. 5.  Colonic diverticulosis without evidence of acute diverticulitis at this time. 6. Additional incidental findings, as above. Electronically Signed   By: Vinnie Langton M.D.   On: 12/10/2018 09:28   Disposition   Pt is being discharged home today in good condition.  Follow-up Plans & Appointments    Follow-up Information    Eileen Stanford, PA-C. Go on 01/07/2019.   Specialties:  Cardiology, Radiology Why:  @ 1:30pm, please arrive at 1:15pm Contact information: [place]  [id number] [phone]          Discharge Instructions    Amb Referral to Cardiac Rehabilitation   Complete by:  As directed    Diagnosis:  Valve Replacement   Valve:  Aortic      Discharge Medications   Allergies as of 01/01/2019   No Known Allergies     Medication List    STOP taking these medications   amLODipine 10 MG tablet Commonly known as:  NORVASC   ciprofloxacin 500 MG tablet Commonly known as:  CIPRO   mupirocin ointment 2 % Commonly known as:  BACTROBAN   nitroGLYCERIN 0.4 MG SL tablet Commonly known as:  NITROSTAT     TAKE these medications   albuterol 108 (90 Base) MCG/ACT inhaler Commonly known as:  PROVENTIL HFA;VENTOLIN HFA Inhale 2 puffs into the lungs every 6 (six) hours as needed for wheezing.   aspirin EC 81 MG tablet Take 81 mg by mouth  daily.   benazepril 20 MG tablet Commonly known as:  LOTENSIN Take 1 tablet (20 mg total) by mouth daily.   CALCIUM 600 + D PO Take 600 mg by mouth 3 (three) times a week.   calcium carbonate 500 MG chewable tablet Commonly known as:  TUMS - dosed in mg elemental calcium Chew 2 tablets by mouth daily as needed for indigestion or heartburn.   clopidogrel 75 MG tablet Commonly known as:  PLAVIX Take 1 tablet (75 mg total) by mouth daily with breakfast.   denosumab 60 MG/ML Soln injection Commonly known as:  PROLIA Inject 60 mg into the skin every 6 (six) months. Administer in upper arm,  thigh, or abdomen   EPINEPHrine 0.3 mg/0.3 mL Soaj injection Commonly known as:  EPI-PEN Inject 0.3 mg into the muscle as needed for anaphylaxis.   multivitamin with minerals Tabs tablet Take 1 tablet by mouth daily.   NASACORT ALLERGY 24HR 55 MCG/ACT Aero nasal inhaler Generic drug:  triamcinolone Place 1 spray into the nose daily as needed (congestion).   rosuvastatin 40 MG tablet Commonly known as:  CRESTOR TAKE 1 TABLET BY MOUTH EVERY DAY   traMADol 50 MG tablet Commonly known as:  ULTRAM Take 2 tablets (100 mg total) by mouth every 4 (four) hours as needed for moderate pain or severe pain.   Vitamin D3 125 MCG (5000 UT) Caps Take 5,000 Units by mouth daily.         Outstanding Labs/Studies   none  Duration of Discharge Encounter   Greater than 30 minutes including physician time.  Mable Fill, PA-C 01/01/2019, 9:07 AM [phone]  Patient seen, examined. Available data reviewed. Agree with findings, assessment, and plan as outlined by Nell Range, PA-C.  The patient is doing well after undergoing surgical repair of her femoral artery injury yesterday.  Her leg pain has resolved.  She has no chest pain or dyspnea.  On my exam, she is alert and oriented no distress.  Lung fields are clear, heart is regular rate and rhythm with a 2/6 systolic ejection murmur at the right upper sternal border.  Abdomen is soft and nontender.  The left groin incision is intact.  Bilateral pedal pulses are easily palpable.  The patient appears stable for discharge with follow-up as outlined above.  Sherren Mocha, M.D. 01/01/2019 9:22 PM

## 2019-01-01 NOTE — Progress Notes (Addendum)
  Progress Note    01/01/2019 7:44 AM 1 Day Post-Op  Subjective:  L foot feeling better since surgery   Vitals:   12/31/18 2318 01/01/19 0440  BP: (!) 125/56 (!) 110/57  Pulse: 88 75  Resp: 16 19  Temp: 97.9 F (36.6 C) 98.6 F (37 C)  SpO2: 94% 92%   Physical Exam: Lungs:  Non labored Incisions:   Extremities:  L groin incision soft, c/d/i Abdomen:  soft Neurologic: A&O  CBC    Component Value Date/Time   WBC 14.8 (H) 01/01/2019 0407   RBC 3.46 (L) 01/01/2019 0407   HGB 10.5 (L) 01/01/2019 0407   HGB 13.6 11/13/2018 1310   HCT 32.1 (L) 01/01/2019 0407   HCT 39.5 11/13/2018 1310   PLT 202 01/01/2019 0407   PLT 335 11/13/2018 1310   MCV 92.8 01/01/2019 0407   MCV 88 11/13/2018 1310   MCH 30.3 01/01/2019 0407   MCHC 32.7 01/01/2019 0407   RDW 14.3 01/01/2019 0407   RDW 12.8 11/13/2018 1310   LYMPHSABS 1.4 11/13/2018 1310   MONOABS 0.7 05/14/2018 1106   EOSABS 0.5 (H) 11/13/2018 1310   BASOSABS 0.1 11/13/2018 1310    BMET    Component Value Date/Time   NA 139 01/01/2019 0407   NA 140 11/13/2018 1310   K 4.1 01/01/2019 0407   CL 106 01/01/2019 0407   CO2 25 01/01/2019 0407   GLUCOSE 127 (H) 01/01/2019 0407   BUN 11 01/01/2019 0407   BUN 16 11/13/2018 1310   CREATININE 0.64 01/01/2019 0407   CREATININE 1.06 (H) 06/05/2016 1637   CALCIUM 8.2 (L) 01/01/2019 0407   GFRNONAA >60 01/01/2019 0407   GFRAA >60 01/01/2019 0407    INR    Component Value Date/Time   INR 1.0 12/26/2018 1014     Intake/Output Summary (Last 24 hours) at 01/01/2019 0744 Last data filed at 01/01/2019 0509 Gross per 24 hour  Intake 1848.38 ml  Output 625 ml  Net 1223.38 ml     Assessment/Plan:  78 y.o. female is s/p L Repair of left femoral artery with endarterectomy and Dacron patch angioplasty 1 Day Post-Op   Perfusing L foot well with palpable DP Groin incision unremarkable Ok for discharge from vascular standpoint Follow up in office in 2 weeks   Dagoberto Ligas,  PA-C [facility] [phone] 01/01/2019 7:44 AM  I have examined the patient, reviewed and agree with above.  Stable for discharge from vascular standpoint.  Palpable distal pulse.  We will see her back in the office in 2 to 3 weeks  Curt Jews, MD 01/01/2019 8:04 AM

## 2019-01-02 ENCOUNTER — Telehealth (HOSPITAL_COMMUNITY): Payer: Self-pay

## 2019-01-02 ENCOUNTER — Telehealth: Payer: Self-pay | Admitting: Physician Assistant

## 2019-01-02 DIAGNOSIS — Z87891 Personal history of nicotine dependence: Secondary | ICD-10-CM | POA: Diagnosis not present

## 2019-01-02 DIAGNOSIS — I97618 Postprocedural hemorrhage and hematoma of a circulatory system organ or structure following other circulatory system procedure: Secondary | ICD-10-CM | POA: Diagnosis not present

## 2019-01-02 DIAGNOSIS — I10 Essential (primary) hypertension: Secondary | ICD-10-CM | POA: Diagnosis not present

## 2019-01-02 DIAGNOSIS — S75092D Other specified injury of femoral artery, left leg, subsequent encounter: Secondary | ICD-10-CM | POA: Diagnosis not present

## 2019-01-02 DIAGNOSIS — J449 Chronic obstructive pulmonary disease, unspecified: Secondary | ICD-10-CM | POA: Diagnosis not present

## 2019-01-02 DIAGNOSIS — R911 Solitary pulmonary nodule: Secondary | ICD-10-CM | POA: Diagnosis not present

## 2019-01-02 DIAGNOSIS — I2510 Atherosclerotic heart disease of native coronary artery without angina pectoris: Secondary | ICD-10-CM | POA: Diagnosis not present

## 2019-01-02 DIAGNOSIS — Z952 Presence of prosthetic heart valve: Secondary | ICD-10-CM | POA: Diagnosis not present

## 2019-01-02 NOTE — Telephone Encounter (Signed)
Pt insurance is active and benefits verified through Medicare a/b Co-pay 0, DED $198/$198 met, out of pocket 0/0 met, co-insurance 20%. no pre-authorization required. Passport, 01/02/2019 @ 11:18am, REF# [id number]  2ndary insurance is active and benefits verified through [employer]. Co-pay 0, DED 0/0 met, out of pocket 0/0 met, co-insurance 0%. No pre-authorization required  Will contact patient to see if she is interested in the Cardiac Rehab Program. If interested, patient will need to complete follow up appt. Once completed, patient will be contacted for scheduling upon review by the RN Navigator. Tedra Senegal. Support Rep II

## 2019-01-02 NOTE — Telephone Encounter (Signed)
  [place] VALVE TEAM   Patient contacted regarding discharge from [facility] on 01/01/2019  Patient understands to follow up with provider Nell Range on 3/11 @ 1:30pm at [place].  Patient understands discharge instructions? yes Patient understands medications and regiment? yes Patient understands to bring all medications to this visit? yes  Angelena Form PA-C  [facility]

## 2019-01-03 NOTE — Progress Notes (Signed)
[facility]                                       Cardiology Office Note    Date:  01/07/2019   ID:  Dominique [name], DOB 10-May-1941, MRN [id number]  PCP:  Colon Branch, MD  Cardiologist:  Dr. [name] / Dr. [name] & Dr. Cyndia Bent (TAVR)  CC: TOC s/p TAVR.   History of Present Illness:  Dominique [name] is a 78 y.o. female with a history of HTN, HLD, COPD and severe AS s/p TAVR (12/30/18) who presents to clinic for follow up.   She said that she was told as a child in [place] that she had some abnormality of her aortic valve. An echocardiogram in 05/2017 showed what appeared to be a trileaflet aortic valve with a mean gradient of 21 mmHg consistent with moderate stenosis. She had a follow-up echocardiogram in 05/2018 which showed an increase in the mean gradient to 36 mmHg with normal left ventricular function but she remained asymptomatic. She now presents with a several month history of progressive exertional fatigue and shortness of breath. A repeat echocardiogram on 10/31/2018 shows a trileaflet aortic valve with moderate thickening and calcification of the leaflets with restricted mobility. The mean gradient increased further to 39 mmHg with a peak gradient of 64 mmHg. There is no regurgitation. Aortic valve area was measured at 0.88 cm. Left ventricular ejection fraction was 60 to 65% with grade 1 diastolic dysfunction. She underwent cardiac catheterization on 11/27/2018 showing normal coronary arteries. The mean gradient across aortic valve was measured at 17 mmHg with with a calculated valve area of 1.47 cm. Right heart pressures were normal.  She underwent successful TAVR with a82mm Medtronic CoreValve Evolut Provia the TF approach on3/12/2018. Post operative echoshowed EF 65%, normally functioning TAVR with mean gradient of 6 mm Hg and no PVL.Post op course was complicated by an ischemic left leg. Dr. [name] took her to the OR for  emergency repair of left femoral artery with endarterectomy and Dacron patch angioplasty. She was discharged on POD2 on Aspirin and plavix. Amlodipine 10 mg daily was held at discharge given normal BPs.   Today she presents to clinic for follow up. No CP or SOB. No LE edema, orthopnea or PND. No dizziness or syncope. No blood in stool or urine. No has had the sensation of her heart beating very transiently. She has also noted that she is still having exertional leg pain and numbness. She notes that her left leg is also much cooler and paler at night than the right.   Past Medical History:  Diagnosis Date  . Emphysema lung ([place])   . Hyperlipidemia    LDL goal < 70  . Hypertension   . Pulmonary HTN ([place]) 06/05/2016   Moderate with PASP 16mmHg by echo 04/2016 likely Group 3 from COPD and possibly Group 2 from pulmonary venous HTN associated with moderate AS  . Pulmonary nodules    seen on pre TAVR CT, needs 12 month CT follow up  . 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 Sacral fracture ([place])   . Severe aortic stenosis   . Thoracic ascending aortic aneurysm [facility])    needs yearly follow up    Past  Surgical History:  Procedure Laterality Date  . APPENDECTOMY  1951  . CATARACT EXTRACTION    . CESAREAN SECTION     1971  . COLONOSCOPY  2018  . FEMORAL-POPLITEAL BYPASS GRAFT Left 12/31/2018   Procedure: PATCH ANGIOPLASTY REPAIR LEFT FEMORAL ARTERY USING HEMASHIELD PLATINUM FINESSE PATCH;  Surgeon: Rosetta Posner, MD;  Location: [facility];  Service: Vascular;  Laterality: Left;  . IR RADIOLOGY PERIPHERAL GUIDED IV START  05/30/2017  . IR RADIOLOGY PERIPHERAL GUIDED IV START  09/02/2018  . IR RADIOLOGY PERIPHERAL GUIDED IV START  09/02/2018  . IR US GUIDE VASC ACCESS LEFT  09/02/2018  . IR US GUIDE VASC ACCESS RIGHT  05/30/2017  . IR US GUIDE VASC ACCESS RIGHT  09/02/2018  . JOINT REPLACEMENT  2016    sacroplasty  . LAMINECTOMY    . RIGHT/LEFT HEART CATH AND CORONARY ANGIOGRAPHY N/A 11/27/2018   Procedure: RIGHT/LEFT HEART CATH AND CORONARY ANGIOGRAPHY;  Surgeon: Sherren Mocha, MD;  Location: [facility];  Service: Cardiovascular;  Laterality: N/A;  . TEE WITHOUT CARDIOVERSION N/A 12/30/2018   Procedure: TRANSESOPHAGEAL ECHOCARDIOGRAM (TEE);  Surgeon: Sherren Mocha, MD;  Location: [facility];  Service: Open Heart Surgery;  Laterality: N/A;  . TOTAL HIP ARTHROPLASTY Bilateral   . TRANSCATHETER AORTIC VALVE REPLACEMENT, TRANSFEMORAL N/A 12/30/2018   Procedure: TRANSCATHETER AORTIC VALVE REPLACEMENT, TRANSFEMORAL;  Surgeon: Sherren Mocha, MD;  Location: [facility];  Service: Open Heart Surgery;  Laterality: N/A;  . TUBAL LIGATION  1975    Current Medications: Outpatient Medications Prior to Visit  Medication Sig Dispense Refill  . albuterol (PROVENTIL HFA;VENTOLIN HFA) 108 (90 Base) MCG/ACT inhaler Inhale 2 puffs into the lungs every 6 (six) hours as needed for wheezing.    Marland Kitchen aspirin EC 81 MG tablet Take 81 mg by mouth daily.    . benazepril (LOTENSIN) 20 MG tablet Take 1 tablet (20 mg total) by mouth daily. 90 tablet 2  . Calcium Carb-Cholecalciferol (CALCIUM 600 + D PO) Take 600 mg by mouth 3 (three) times a week.     . calcium carbonate (TUMS - DOSED IN MG ELEMENTAL CALCIUM) 500 MG chewable tablet Chew 2 tablets by mouth daily as needed for indigestion or heartburn.    . Cholecalciferol (VITAMIN D3) 5000 UNITS CAPS Take 5,000 Units by mouth daily.     . clopidogrel (PLAVIX) 75 MG tablet Take 1 tablet (75 mg total) by mouth daily with breakfast. 90 tablet 1  . denosumab (PROLIA) 60 MG/ML SOLN injection Inject 60 mg into the skin every 6 (six) months. Administer in upper arm, thigh, or abdomen    . EPINEPHrine 0.3 mg/0.3 mL IJ SOAJ injection Inject 0.3 mg into the muscle as needed for anaphylaxis.   12  . Multiple Vitamin (MULTIVITAMIN WITH MINERALS) TABS tablet Take  1 tablet by mouth daily.    . rosuvastatin (CRESTOR) 40 MG tablet TAKE 1 TABLET BY MOUTH EVERY DAY 90 tablet 3  . traMADol (ULTRAM) 50 MG tablet Take 2 tablets (100 mg total) by mouth every 4 (four) hours as needed for moderate pain or severe pain. 20 tablet 0  . triamcinolone (NASACORT ALLERGY 24HR) 55 MCG/ACT AERO nasal inhaler Place 1 spray into the nose daily as needed (congestion).     No facility-administered medications prior to visit.      Allergies:   Patient has no known allergies.   Social History   Socioeconomic History  . Marital status: Widowed    Spouse  name: Not on file  . Number of children: 2  . Years of education: Not on file  . Highest education level: Not on file  Occupational History  . Occupation: n/a  Social Needs  . Financial resource strain: Not on file  . Food insecurity:    Worry: Not on file    Inability: Not on file  . Transportation needs:    Medical: Not on file    Non-medical: Not on file  Tobacco Use  . Smoking status: Former Smoker    Packs/day: 1.00    Years: 40.00    Pack years: 40.00    Types: Cigarettes    Last attempt to quit: 03/19/2004    Years since quitting: 14.8  . Smokeless tobacco: Never Used  Substance and Sexual Activity  . Alcohol use: Yes    Alcohol/week: 3.0 - 4.0 standard drinks    Types: 3 - 4 Glasses of wine per week    Comment: socially  . Drug use: No  . Sexual activity: Not on file  Lifestyle  . Physical activity:    Days per week: Not on file    Minutes per session: Not on file  . Stress: Not on file  Relationships  . Social connections:    Talks on phone: Not on file    Gets together: Not on file    Attends religious service: Not on file    Active member of club or organization: Not on file    Attends meetings of clubs or organizations: Not on file    Relationship status: Not on file  Other Topics Concern  . Not on file  Social History Narrative   From [place]   1 living son     Family History:   The patient's family history includes Breast cancer in her mother; Cancer in her brother; Diabetes in her son.      ROS:   Please see the history of present illness.    ROS All other systems reviewed and are negative.   PHYSICAL EXAM:   VS:  BP 102/60   Pulse (!) 101   Ht 5\' 6"  (1.676 m)   Wt 157 lb 12.8 oz (71.6 kg)   SpO2 97%   BMI 25.47 kg/m    GEN: Well nourished, well developed, in no acute distress HEENT: normal Neck: no JVD or masses Cardiac: RRR; no murmurs, rubs, or gallops,no edema  Respiratory:  clear to auscultation bilaterally, normal work of breathing GI: soft, nontender, nondistended, + BS MS: no deformity or atrophy Skin: warm and dry, no rash. Groin sites healing well. Left surgical incision healing well. Left leg very slightly cooler than right. Both pedal pulses able to be doppler but left much more difficult.  Neuro:  Alert and Oriented x 3, Strength and sensation are intact Psych: euthymic mood, full affect    Wt Readings from Last 3 Encounters:  01/07/19 157 lb 12.8 oz (71.6 kg)  01/01/19 163 lb (73.9 kg)  12/26/18 157 lb 4 oz (71.3 kg)      Studies/Labs Reviewed:   EKG:  EKG is ordered today.  The ekg ordered today demonstrates sinus tachycardia. HR 101  Recent Labs: 05/14/2018: TSH 1.12 12/26/2018: ALT 20; B Natriuretic Peptide 65.3 12/31/2018: Magnesium 1.6 01/01/2019: BUN 11; Creatinine, Ser 0.64; Hemoglobin 10.5; Platelets 202; Potassium 4.1; Sodium 139   Lipid Panel    Component Value Date/Time   CHOL 156 03/10/2018 1108   TRIG 61 03/10/2018 1108   HDL 71  03/10/2018 1108   CHOLHDL 2.2 03/10/2018 1108   CHOLHDL 2 11/13/2017 1127   VLDL 13.2 11/13/2017 1127   LDLCALC 73 03/10/2018 1108    Additional studies/ records that were reviewed today include:  TAVR OPERATIVE NOTE  Date of Procedure:12/30/2018  Preoperative Diagnosis:Severe Aortic Stenosis   Postoperative Diagnosis:Same    Procedure:   Transcatheter Aortic Valve Replacement - PercutaneousRightTransfemoral Approach Medtronic CoreValve Evolut Pro +(size 24mm, serial # [id number])  Co-Surgeons:Bryan Alveria Apley, MD and Sherren Mocha, MD   Anesthesiologist:Ryan Roanna Banning, MD  Dala Dock, MD  Pre-operative Echo Findings: ? Severe aortic stenosis ? Normal left ventricular systolic function  Post-operative Echo Findings: ? Noparavalvular leak ? Normal left ventricular systolic function  _____________  Echo 12/31/18 IMPRESSIONS 1. The left ventricle has hyperdynamic systolic function, with an ejection fraction of >65%. The cavity size was normal. There is moderately increased left ventricular wall thickness. Left ventricular diastolic Doppler parameters are consistent with  impaired relaxation Indeterminent filling pressures No evidence of left ventricular regional wall motion abnormalities. 2. The right ventricle has normal systolic function. The cavity was normal. There is no increase in right ventricular wall thickness. 3. Left atrial size was mildly dilated. 4. The mitral valve is degenerative. Mild calcification of the mitral valve leaflet. 5. The aortic root and ascending aorta are normal in size and structure. 6. The inferior vena cava was dilated in size with <50% respiratory variability. 7. No evidence of left ventricular regional wall motion abnormalities. 8. When compared to the prior study: 10/30/2018 - 60-65%, severe AS.  Aortic Valve: The aortic valve has been repaired/replaced Aortic valve regurgitation was not visualized by color flow Doppler. There is no evidence of aortic valve stenosis.  _____________  Arterial dopplers 12/31/2018 Summary: Left: Left common femoral artery is occluded, appartent flap noted. Monophasic flow is noted distal to  obstruction.    ASSESSMENT & PLAN:   Severe AS s/p TAVR:doing well but still having left leg pain with exertion and feels like leg is cooler. I was able to doppler a pulse on left leg but much more difficult to find than on right. Leg is very slightly cooler on left than right. Otherwise groin site look stable. ECG with mild sinus tach (HR 101 and no HAVB.) Continue on aspirin and plavix. SBE prophylaxis discussed; I have RX'd clindamycin due to a PCN allergy. I will see her back 3/25 for 1 month follow up with echo.   HTN: BP well controlled off amlodipine. Continue current regimen   HLD: continue statin  COPD: stable.  Ischemic left leg s/p left femoral artery endarterectomy and Dacron patch angioplasty: she continues to have some symptoms of exertional LE pain and numbness although much improved from prior to surgery. We were able to doppler her pulse in the office, but it was difficult to find. She has [facility] follow up 3/24. Given continued symptoms, we have called [facility] office and they will see her tomorrow morning.   Medication Adjustments/Labs and Tests Ordered: Current medicines are reviewed at length with the patient today.  Concerns regarding medicines are outlined above.  Medication changes, Labs and Tests ordered today are listed in the Patient Instructions below. Patient Instructions  Medication Instructions:  Your provider discussed the importance of taking an antibiotic prior to all dental visits to prevent damage to the heart valves from infection. You were given a prescription for AMOXIL 2,000mg  to take one hour prior to any dental appointment.   Follow-Up: You have an appointment at [facility], 01/08/2019. Please arrive at 10:45AM for an ultrasound of your leg. You will have an appointment with Vinnie Level Nickel at 1:45PM (there will be a small break between appointments).  Address: [address] [place], [place] [address]   Please keep  your upcoming appointments on 01/21/2019 for your echocardiogram and subsequent office visit with Nell Range. Please arrive by 1:30PM for these appointments.     Signed, Angelena Form, PA-C  01/07/2019 3:05 PM    [facility] [place], [place], [place]  [address] Phone: [phone]; Fax: [phone]

## 2019-01-06 ENCOUNTER — Ambulatory Visit: Payer: Medicare Other | Admitting: Internal Medicine

## 2019-01-06 NOTE — Telephone Encounter (Signed)
Called and spoke with patient advised her of her appt on 01/20/19 @ 12:15.  Will mail patient paperwork

## 2019-01-07 ENCOUNTER — Other Ambulatory Visit: Payer: Self-pay | Admitting: *Deleted

## 2019-01-07 ENCOUNTER — Telehealth: Payer: Self-pay | Admitting: *Deleted

## 2019-01-07 ENCOUNTER — Encounter: Payer: Self-pay | Admitting: Physician Assistant

## 2019-01-07 ENCOUNTER — Ambulatory Visit (INDEPENDENT_AMBULATORY_CARE_PROVIDER_SITE_OTHER): Payer: Medicare Other | Admitting: Physician Assistant

## 2019-01-07 ENCOUNTER — Other Ambulatory Visit: Payer: Self-pay

## 2019-01-07 VITALS — BP 102/60 | HR 101 | Ht 66.0 in | Wt 157.8 lb

## 2019-01-07 DIAGNOSIS — Z952 Presence of prosthetic heart valve: Secondary | ICD-10-CM | POA: Diagnosis not present

## 2019-01-07 DIAGNOSIS — R0989 Other specified symptoms and signs involving the circulatory and respiratory systems: Secondary | ICD-10-CM

## 2019-01-07 DIAGNOSIS — E785 Hyperlipidemia, unspecified: Secondary | ICD-10-CM

## 2019-01-07 DIAGNOSIS — S75092D Other specified injury of femoral artery, left leg, subsequent encounter: Secondary | ICD-10-CM | POA: Diagnosis not present

## 2019-01-07 DIAGNOSIS — J449 Chronic obstructive pulmonary disease, unspecified: Secondary | ICD-10-CM | POA: Diagnosis not present

## 2019-01-07 DIAGNOSIS — I998 Other disorder of circulatory system: Secondary | ICD-10-CM

## 2019-01-07 DIAGNOSIS — I10 Essential (primary) hypertension: Secondary | ICD-10-CM | POA: Diagnosis not present

## 2019-01-07 DIAGNOSIS — I2510 Atherosclerotic heart disease of native coronary artery without angina pectoris: Secondary | ICD-10-CM | POA: Diagnosis not present

## 2019-01-07 DIAGNOSIS — I97618 Postprocedural hemorrhage and hematoma of a circulatory system organ or structure following other circulatory system procedure: Secondary | ICD-10-CM | POA: Diagnosis not present

## 2019-01-07 MED ORDER — AMOXICILLIN 500 MG PO TABS: ORAL_TABLET | ORAL | 6 refills | Status: DC

## 2019-01-07 NOTE — Telephone Encounter (Signed)
Call from [place] at [facility] request patient be seen here ASAP to evaluate left leg. Extremity cool, pale and pain. Pulse present per doppler in office. Scheduled for arterial duplex and see NP 01/08/2019. Agreeable by [facility]. Patient will be notified by this office with final details of appointment. Instructed to have patient to report to [facility] ER for acute/or worsening condition.

## 2019-01-07 NOTE — Patient Instructions (Addendum)
Medication Instructions:  Your provider discussed the importance of taking an antibiotic prior to all dental visits to prevent damage to the heart valves from infection. You were given a prescription for AMOXIL 2,000mg  to take one hour prior to any dental appointment.   Follow-Up: You have an appointment at [facility], 01/08/2019. Please arrive at 10:45AM for an ultrasound of your leg. You will have an appointment with Vinnie Level Nickel at 1:45PM (there will be a small break between appointments).  Address: [address] [place], [place] [address]   Please keep your upcoming appointments on 01/21/2019 for your echocardiogram and subsequent office visit with Nell Range. Please arrive by 1:30PM for these appointments.

## 2019-01-07 NOTE — Telephone Encounter (Signed)
Verified patient arrival time to be 11:45 am 01/08/2019 for vascular study first then will see NP afterward. Aware there will be wait  time between visits.

## 2019-01-08 ENCOUNTER — Other Ambulatory Visit: Payer: Self-pay

## 2019-01-08 ENCOUNTER — Ambulatory Visit (HOSPITAL_COMMUNITY)
Admission: RE | Admit: 2019-01-08 | Discharge: 2019-01-08 | Disposition: A | Discharge status: Home / Self Care | Payer: Medicare Other | Source: Ambulatory Visit | Attending: Vascular Surgery | Admitting: Vascular Surgery

## 2019-01-08 ENCOUNTER — Ambulatory Visit (INDEPENDENT_AMBULATORY_CARE_PROVIDER_SITE_OTHER): Payer: Medicare Other | Admitting: Family

## 2019-01-08 ENCOUNTER — Encounter: Payer: Self-pay | Admitting: Family

## 2019-01-08 VITALS — BP 131/78 | HR 93 | Resp 16 | Ht 66.0 in | Wt 155.0 lb

## 2019-01-08 DIAGNOSIS — S75002D Unspecified injury of femoral artery, left leg, subsequent encounter: Secondary | ICD-10-CM

## 2019-01-08 DIAGNOSIS — Z9889 Other specified postprocedural states: Secondary | ICD-10-CM

## 2019-01-08 DIAGNOSIS — I998 Other disorder of circulatory system: Secondary | ICD-10-CM

## 2019-01-08 DIAGNOSIS — I350 Nonrheumatic aortic (valve) stenosis: Secondary | ICD-10-CM

## 2019-01-08 DIAGNOSIS — Z8679 Personal history of other diseases of the circulatory system: Secondary | ICD-10-CM

## 2019-01-08 NOTE — Progress Notes (Signed)
[facility] [place]   CC: Follow up repair of left femoral artery s/p TAVR  History of Present Illness Dominique [name] is a 78 y.o. female who is s/p transfemoral aortic valve replacement by Dr. [name] on 12-30-18. She then required a repair of left femoral artery with endarterectomy and Dacron patch angioplasty on 12-31-18 by Dr. [name] for Ischemic left leg following the TAVR.   She returns today at the request of her cardiologist, seen yesterday s/p TAVR for left leg feeling cold, feels less cold today than yesterday, and less numb in her left foot today than yesterday.   Pt states she felt much improved after the TAVR, more energy, no shortness of breath.   She also has an appointment to see me on 01-20-19 for 2 week post op wound check.   She denies any pain or weakness in the muscles of her legs with walking, she denies any history of peripheral artery disease.   She denies any known hx of stroke or TIA.    Diabetic: No Tobacco use: former smoker, quit in 2005, smoked x 40 years   Pt meds include: Statin :Yes Betablocker: No ASA: Yes Other anticoagulants/antiplatelets: She has a prescription for Plavix that she will start ASAP.    Past Medical History:  Diagnosis Date  . Emphysema lung ([place])   . Hyperlipidemia    LDL goal < 70  . Hypertension   . Pulmonary HTN ([place]) 06/05/2016   Moderate with PASP 38mmHg by echo 04/2016 likely Group 3 from COPD and possibly Group 2 from pulmonary venous HTN associated with moderate AS  . Pulmonary nodules    seen on pre TAVR CT, needs 12 month CT follow up  . 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 Sacral fracture ([place])   . Severe aortic stenosis   . Thoracic ascending aortic aneurysm [facility])    needs yearly follow up    Social History Social History   Tobacco Use  . Smoking status: Former Smoker     Packs/day: 1.00    Years: 40.00    Pack years: 40.00    Types: Cigarettes    Last attempt to quit: 03/19/2004    Years since quitting: 14.8  . Smokeless tobacco: Never Used  Substance Use Topics  . Alcohol use: Yes    Alcohol/week: 3.0 - 4.0 standard drinks    Types: 3 - 4 Glasses of wine per week    Comment: socially  . Drug use: No    Family History Family History  Problem Relation Age of Onset  . Breast cancer Mother        breast  . Diabetes Son        type 1  . Cancer Brother        lung  . Colon cancer Neg Hx   . CAD Neg Hx   . Colon polyps Neg Hx   . Rectal cancer Neg Hx   . Stomach cancer Neg Hx     Past Surgical History:  Procedure Laterality Date  . APPENDECTOMY  1951  . CATARACT EXTRACTION    . CESAREAN SECTION     1971  . COLONOSCOPY  2018  . FEMORAL-POPLITEAL BYPASS GRAFT Left 12/31/2018   Procedure: PATCH ANGIOPLASTY REPAIR LEFT FEMORAL ARTERY USING HEMASHIELD PLATINUM FINESSE PATCH;  Surgeon: Rosetta Posner, MD;  Location: [place] [facility];  Service:  Vascular;  Laterality: Left;  . IR RADIOLOGY PERIPHERAL GUIDED IV START  05/30/2017  . IR RADIOLOGY PERIPHERAL GUIDED IV START  09/02/2018  . IR RADIOLOGY PERIPHERAL GUIDED IV START  09/02/2018  . IR US GUIDE VASC ACCESS LEFT  09/02/2018  . IR US GUIDE VASC ACCESS RIGHT  05/30/2017  . IR US GUIDE VASC ACCESS RIGHT  09/02/2018  . JOINT REPLACEMENT  2016   sacroplasty  . LAMINECTOMY    . RIGHT/LEFT HEART CATH AND CORONARY ANGIOGRAPHY N/A 11/27/2018   Procedure: RIGHT/LEFT HEART CATH AND CORONARY ANGIOGRAPHY;  Surgeon: Sherren Mocha, MD;  Location: [facility];  Service: Cardiovascular;  Laterality: N/A;  . TEE WITHOUT CARDIOVERSION N/A 12/30/2018   Procedure: TRANSESOPHAGEAL ECHOCARDIOGRAM (TEE);  Surgeon: Sherren Mocha, MD;  Location: [facility] [facility];  Service: Open Heart Surgery;  Laterality: N/A;  . TOTAL HIP ARTHROPLASTY Bilateral   . TRANSCATHETER AORTIC VALVE REPLACEMENT, TRANSFEMORAL N/A 12/30/2018    Procedure: TRANSCATHETER AORTIC VALVE REPLACEMENT, TRANSFEMORAL;  Surgeon: Sherren Mocha, MD;  Location: [facility];  Service: Open Heart Surgery;  Laterality: N/A;  . TUBAL LIGATION  1975    No Known Allergies  Current Outpatient Medications  Medication Sig Dispense Refill  . albuterol (PROVENTIL HFA;VENTOLIN HFA) 108 (90 Base) MCG/ACT inhaler Inhale 2 puffs into the lungs every 6 (six) hours as needed for wheezing.    Marland Kitchen amoxicillin (AMOXIL) 500 MG tablet Take 4 tablets (2,000mg ) one hour prior to all dental visits. 8 tablet 6  . aspirin EC 81 MG tablet Take 81 mg by mouth daily.    . benazepril (LOTENSIN) 20 MG tablet Take 1 tablet (20 mg total) by mouth daily. 90 tablet 2  . Calcium Carb-Cholecalciferol (CALCIUM 600 + D PO) Take 600 mg by mouth 3 (three) times a week.     . calcium carbonate (TUMS - DOSED IN MG ELEMENTAL CALCIUM) 500 MG chewable tablet Chew 2 tablets by mouth daily as needed for indigestion or heartburn.    . Cholecalciferol (VITAMIN D3) 5000 UNITS CAPS Take 5,000 Units by mouth daily.     . clopidogrel (PLAVIX) 75 MG tablet Take 1 tablet (75 mg total) by mouth daily with breakfast. 90 tablet 1  . denosumab (PROLIA) 60 MG/ML SOLN injection Inject 60 mg into the skin every 6 (six) months. Administer in upper arm, thigh, or abdomen    . EPINEPHrine 0.3 mg/0.3 mL IJ SOAJ injection Inject 0.3 mg into the muscle as needed for anaphylaxis.   12  . Multiple Vitamin (MULTIVITAMIN WITH MINERALS) TABS tablet Take 1 tablet by mouth daily.    . rosuvastatin (CRESTOR) 40 MG tablet TAKE 1 TABLET BY MOUTH EVERY DAY 90 tablet 3  . traMADol (ULTRAM) 50 MG tablet Take 2 tablets (100 mg total) by mouth every 4 (four) hours as needed for moderate pain or severe pain. 20 tablet 0  . triamcinolone (NASACORT ALLERGY 24HR) 55 MCG/ACT AERO nasal inhaler Place 1 spray into the nose daily as needed (congestion).     No current facility-administered medications for this visit.     ROS:  See HPI for pertinent positives and negatives.   Physical Examination  Vitals:   01/08/19 1337  BP: 131/78  Pulse: 93  Resp: 16  SpO2: 95%  Weight: 155 lb (70.3 kg)  Height: 5\' 6"  (1.676 m)   Body mass index is 25.02 kg/m.  General: A&O x 3, WDWN, female. Gait: normal HENT: No gross abnormalities.  Eyes: PERRLA. Pulmonary: Respirations  are non labored, CTAB, good air movement in all fields Cardiac: regular rhythm, no detected murmur.         Carotid Bruits Right Left   Negative Negative   Radial pulses are 1+ palpable left, 2+ palpable right Adominal aortic pulse is not palpable                         VASCULAR EXAM: Extremities without ischemic changes, without Gangrene; without open wounds.  3 cm hematoma left groin, left groin puncture site well healed, no drainage.                                                                                                           LE Pulses Right Left       FEMORAL  2+ palpable  2+ palpable        POPLITEAL  not palpable   not palpable       POSTERIOR TIBIAL  not palpable   2+ palpable        DORSALIS PEDIS      ANTERIOR TIBIAL not palpable  not palpable    Abdomen: soft, NT, no palpable masses. Skin: no rashes, no cellulitis, no ulcers noted. Musculoskeletal: no muscle wasting or atrophy.  Neurologic: A&O X 3; appropriate affect, Sensation is normal; MOTOR FUNCTION:  moving all extremities equally, motor strength 5/5 throughout. Speech is fluent/normal. CN 2-12 intact. Psychiatric: Thought content is normal, mood appropriate for clinical situation.     ASSESSMENT: Dominique [name] is a 78 y.o. female who  is s/p transfemoral aortic valve replacement by Dr. [name] on 12-30-18. She then required a repair of left femoral artery with endarterectomy and Dacron patch angioplasty on 12-31-18 by Dr. [name] for Ischemic left leg following the TAVR.   She returns today at the request of her cardiologist, seen yesterday s/p TAVR  for left leg feeling cold, feels less cold today than yesterday, and less numb in her left foot today than yesterday.   Pt states she felt much improved after the TAVR, more energy, no shortness of breath.    Arterial duplex of left leg today shows no stenosis. Left PT pulse is 2+ palpable. No signs of ischemia in either lower extremity.   She takes a daily ASA and a statin, will start Plavix today or tomorrow.    DATA Date: 01/08/2019):  Right LE Duplex Findings: +--------+--------+-----+--------+--------+--------+         PSV cm/sRatioStenosisWaveformComments +--------+--------+-----+--------+--------+--------+ CFA Prox120                  biphasic         +--------+--------+-----+--------+--------+--------+    Left LE Duplex Findings: +-----------+--------+-----+--------+--------+--------+            PSV cm/sRatioStenosisWaveformComments +-----------+--------+-----+--------+--------+--------+ CFA Prox   88                   biphasic         +-----------+--------+-----+--------+--------+--------+ DFA        79  biphasic         +-----------+--------+-----+--------+--------+--------+ SFA Prox   131                  biphasic         +-----------+--------+-----+--------+--------+--------+ SFA Mid    129                  biphasic         +-----------+--------+-----+--------+--------+--------+ SFA Distal 98                   biphasic         +-----------+--------+-----+--------+--------+--------+ POP Mid    54                   biphasic         +-----------+--------+-----+--------+--------+--------+ POP Distal 86                   biphasic         +-----------+--------+-----+--------+--------+--------+ ATA Distal 23                   biphasic         +-----------+--------+-----+--------+--------+--------+ PTA Distal 58                   biphasic          +-----------+--------+-----+--------+--------+--------+ PERO Distal47                   biphasic         +-----------+--------+-----+--------+--------+--------+  Summary: Left: Normal examination. No evidence of arterial occlusive disease.   PLAN:  Based on the patient's vascular studies and examination, and after discussing follow up for pt with Dr. Oneida Alar, pt will return to clinic in 4 months with ABI's. I advised her to notify us if she develops concerns re the circulation in her feet or legs.  Will cancel appointment on 01-20-19, which was for wound check.  If ABI's are normal on her return, and pt has no claudication sx's and no signs of ischemia in her LE, she will not need follow up after her next visit, since she has no hx of PAOD.   I discussed in depth with the patient the nature of atherosclerosis, and emphasized the importance of maximal medical management including strict control of blood pressure, blood glucose, and lipid levels, obtaining regular exercise, and continued cessation of smoking.  The patient is aware that without maximal medical management the underlying atherosclerotic disease process will progress, limiting the benefit of any interventions.   Clemon Chambers, RN, MSN, FNP-C [facility] Phone: [phone]  Clinic MD: Oneida Alar  01/08/19 2:01 PM

## 2019-01-09 ENCOUNTER — Telehealth (HOSPITAL_COMMUNITY): Payer: Self-pay | Admitting: *Deleted

## 2019-01-09 DIAGNOSIS — I2510 Atherosclerotic heart disease of native coronary artery without angina pectoris: Secondary | ICD-10-CM | POA: Diagnosis not present

## 2019-01-09 DIAGNOSIS — J449 Chronic obstructive pulmonary disease, unspecified: Secondary | ICD-10-CM | POA: Diagnosis not present

## 2019-01-09 DIAGNOSIS — I10 Essential (primary) hypertension: Secondary | ICD-10-CM | POA: Diagnosis not present

## 2019-01-09 DIAGNOSIS — S75092D Other specified injury of femoral artery, left leg, subsequent encounter: Secondary | ICD-10-CM | POA: Diagnosis not present

## 2019-01-09 DIAGNOSIS — Z952 Presence of prosthetic heart valve: Secondary | ICD-10-CM | POA: Diagnosis not present

## 2019-01-09 DIAGNOSIS — I97618 Postprocedural hemorrhage and hematoma of a circulatory system organ or structure following other circulatory system procedure: Secondary | ICD-10-CM | POA: Diagnosis not present

## 2019-01-09 NOTE — Telephone Encounter (Signed)
-----   Message from Viann Fish, NP sent at 01/08/2019  9:11 PM EDT ----- Regarding: RE: Ok to proceed with exercise Car[place], Pt may certainly proceed with cardiac rehab from a peripheral vascular standpoint. Thank you, Vinnie Level ----- Message ----- From: Rowe Pavy, RN Sent: 01/08/2019   3:15 PM EDT To: Sharmon Leyden Nickel, NP Subject: Ok to proceed with exercise                     Vinnie Level,  The above pt is referred to Cardiac rehab s/p TAVR on 12/30/2018.  Pt also had a femoral endarterectomy on 3/4 by Dr. Oneida Alar.  Pt seen in follow up by you on 01/08/19.  Based upon your assessment, may Ms. Lyerly proceed with scheduling and participating in Cardiac Rehab. Note we are scheduling for the first of April.  Thanks so much for your input  Cherre Huger, BSN Cardiac and Pulmonary Rehab Nurse Navigator

## 2019-01-14 NOTE — Telephone Encounter (Signed)
Attempted to call patient in regards to Cardiac Rehab - to let pt know we are closed at this time due to the COVID-19 and will contact once we have resume scheduling.  °LMTCB °

## 2019-01-16 ENCOUNTER — Telehealth: Payer: Self-pay | Admitting: Physician Assistant

## 2019-01-16 NOTE — Telephone Encounter (Signed)
In light of COVID-19 pandemic, Dominique [name] would like to avoid coming to the office if possible.  She denies CP and states she has been able to walk her dog longer than she used to which she is very happy about.  Informed her that her echo 3/25 will be cancelled at this time. She understands Nell Range, [place] will call her 3/25 and conduct a phone visit. She understands NOT to come to the office.  She was grateful for call and agrees with treatment plan.

## 2019-01-16 NOTE — Telephone Encounter (Signed)
New Message:    Pt is scheduled for an Echo and appt  with Dominique [name] on 01-21-19. She wants to know if she should come in for the appointments.?

## 2019-01-20 ENCOUNTER — Ambulatory Visit: Payer: Medicare Other | Admitting: Family

## 2019-01-20 ENCOUNTER — Ambulatory Visit: Payer: Medicare Other

## 2019-01-21 ENCOUNTER — Other Ambulatory Visit: Payer: Self-pay

## 2019-01-21 ENCOUNTER — Other Ambulatory Visit: Payer: Self-pay | Admitting: Physician Assistant

## 2019-01-21 ENCOUNTER — Telehealth (INDEPENDENT_AMBULATORY_CARE_PROVIDER_SITE_OTHER): Payer: Medicare Other | Admitting: Physician Assistant

## 2019-01-21 ENCOUNTER — Other Ambulatory Visit (HOSPITAL_COMMUNITY): Payer: Medicare Other

## 2019-01-21 DIAGNOSIS — Z952 Presence of prosthetic heart valve: Secondary | ICD-10-CM

## 2019-01-21 NOTE — Patient Instructions (Signed)
Hello Ms Klinkner,   It was so nice to talk to you on the phone today. I just wanted to send you a recap of our discussion.   Please continue taking antibiotics prior to any dental work including cleanings. You can discontinue plavix after 6 months of therapy (around 07/02/2019) or when your pills run out (you were given a 90 day supply with one refill). Please continue on aspirin 81 mg indefinitely.   Your 1 month echo has been rescheduled.   You will be seen back by your primary cardiologist, Dr. [name], in 3-4 months. She may have a temporary substitute as she is pregnant.  I will see you back in 1 years time with an echo.   All appointment details are attached to this letter in your "after visit summary."  Please call us with any questions or concerns you may have and please stay safe during these uncertain times.  Nell Range

## 2019-01-21 NOTE — Progress Notes (Signed)
[facility]   MULTIDISCIPLINARY HEART VALVE TEAM   Evaluation Performed:  Follow-up visit  This visit type was conducted due to national recommendations for restrictions regarding the COVID-19 Pandemic (e.g. social distancing).  This format is felt to be most appropriate for this patient at this time.  All issues noted in this document were discussed and addressed.  No physical exam was performed (except for noted visual exam findings with Telehealth visits).  The patient has consented to conduct a Telehealth visit and understands insurance will be billed.   Date:  01/21/2019   ID:  Dominique [name], DOB 1940/12/17, MRN [id number]  Patient Location:  [place] [address]   Provider location:   [place], [place] [address]  PCP:  Colon Branch, MD  Cardiologist:  Dr. [name] / Dr. [name] & Dr. Cyndia Bent (TAVR)  Chief Complaint:  1 month s/p TAVR   History of Present Illness:    Dominique [name] is a 78 y.o. female with a history of HTN, HLD, COPD and severe AS s/p TAVR (12/30/18) who presents via audio conferencing for a telehealth visit today.    The patient does not have symptoms concerning for COVID-19 infection (fever, chills, cough, or new SHORTNESS OF BREATH).   Patient presented with a several month history of exertional fatigue and SOB and she was found ot have severe AS by echocardiogram on 10/31/2018, which showed a trileaflet aortic valve with moderate thickening and calcification of the leaflets with restricted mobility. The mean gradient was 39 mmHg with a peak gradient of 64 mmHg. AVA 0.88 cm. Left ventricular ejection fraction was 60 to 65% with grade 1 diastolic dysfunction. She underwent cardiac catheterization on 11/27/2018 showing normal coronary arteries. The mean gradient across aortic valve was measured at 17 mmHg with with a calculated valve area of 1.47 cm. Right heart pressures were normal.  She underwent successful TAVR with a24mm  Medtronic CoreValve Evolut Provia the TF approach on3/12/2018. Post operative echoshowed EF 65%, normally functioning TAVR with mean gradient of 6 mm Hg and no PVL.Post op course was complicated by an ischemic left leg. Dr. [name] took her to the OR for emergency repair of left femoral artery with endarterectomy and Dacron patch angioplasty. She was discharged on POD2 on Aspirin and plavix. Amlodipine 10 mg daily was held at discharge given normal BPs.   At 1 week follow up she was feeling much better in terms of her breathing and fatigue. She did continue to have LE pain and she was referred back to vascular for repeat arterial duplex. Leg was found to have good perfusion.   She was scheduled for her 1 month echo and follow up today, but this was converted to a phone visit given the ongoing Covid 19 Pandemic. She reports worsening shortness of breath with minimal exertion, similar to prior to her TAVR. She denies LE edema but does have symptoms of orthopnea. No PND. No dizziness of syncope. No blood in stool or urine. She has been exercising some with no issues. Her leg is feeling much better.   Prior CV studies:   The following studies were reviewed today:  TAVR OPERATIVE NOTE  Date of Procedure:12/30/2018  Preoperative Diagnosis:Severe Aortic Stenosis   Postoperative Diagnosis:Same   Procedure:   Transcatheter Aortic Valve Replacement - PercutaneousRightTransfemoral Approach Medtronic CoreValve Evolut Pro +(size 76mm, serial # [id number])  Co-Surgeons:Bryan Alveria Apley, MD and Sherren Mocha, MD   Anesthesiologist:Ryan Roanna Banning, MD  Dala Dock, MD  Pre-operative Echo Findings: ? Severe aortic stenosis ? Normal left ventricular systolic function  Post-operative Echo Findings: ? Noparavalvular leak ? Normal left ventricular systolic  function  _____________  Echo 12/31/18 IMPRESSIONS 1. The left ventricle has hyperdynamic systolic function, with an ejection fraction of >65%. The cavity size was normal. There is moderately increased left ventricular wall thickness. Left ventricular diastolic Doppler parameters are consistent with  impaired relaxation Indeterminent filling pressures No evidence of left ventricular regional wall motion abnormalities. 2. The right ventricle has normal systolic function. The cavity was normal. There is no increase in right ventricular wall thickness. 3. Left atrial size was mildly dilated. 4. The mitral valve is degenerative. Mild calcification of the mitral valve leaflet. 5. The aortic root and ascending aorta are normal in size and structure. 6. The inferior vena cava was dilated in size with <50% respiratory variability. 7. No evidence of left ventricular regional wall motion abnormalities. 8. When compared to the prior study: 10/30/2018 - 60-65%, severe AS.  Aortic Valve: The aortic valve has been repaired/replaced Aortic valve regurgitation was not visualized by color flow Doppler. There is no evidence of aortic valve stenosis.  _____________      Past Medical History:  Diagnosis Date   Emphysema lung ([facility])    Hyperlipidemia    LDL goal < 70   Hypertension    Pulmonary HTN ([facility]) 06/05/2016   Moderate with PASP 79mmHg by echo 04/2016 likely Group 3 from COPD and possibly Group 2 from pulmonary venous HTN associated with moderate AS   Pulmonary nodules    seen on pre TAVR CT, needs 12 month CT follow up   S/P TAVR (transcatheter aortic valve replacement) 12/30/2018   Medtronic Evolut Pro-Plus THV (size 26 mm, serial # [id number]) via the TF approach   Sacral fracture ([facility])    Severe aortic stenosis    Thoracic ascending aortic aneurysm ([place])    needs yearly follow up   Past Surgical History:  Procedure Laterality Date   [facility]   COLONOSCOPY  2018   FEMORAL-POPLITEAL BYPASS GRAFT Left 12/31/2018   Procedure: PATCH ANGIOPLASTY REPAIR LEFT FEMORAL ARTERY USING [place];  Surgeon: Rosetta Posner, MD;  Location: [facility];  Service: Vascular;  Laterality: Left;   IR RADIOLOGY PERIPHERAL GUIDED IV START  05/30/2017   IR RADIOLOGY PERIPHERAL GUIDED IV START  09/02/2018   IR RADIOLOGY PERIPHERAL GUIDED IV START  09/02/2018   IR US GUIDE VASC ACCESS LEFT  09/02/2018   IR US GUIDE VASC ACCESS RIGHT  05/30/2017   IR US GUIDE VASC ACCESS RIGHT  09/02/2018   JOINT REPLACEMENT  2016   sacroplasty   LAMINECTOMY     RIGHT/LEFT HEART CATH AND CORONARY ANGIOGRAPHY N/A 11/27/2018   Procedure: RIGHT/LEFT HEART CATH AND CORONARY ANGIOGRAPHY;  Surgeon: Sherren Mocha, MD;  Location: [facility];  Service: Cardiovascular;  Laterality: N/A;   TEE WITHOUT CARDIOVERSION N/A 12/30/2018   Procedure: TRANSESOPHAGEAL ECHOCARDIOGRAM (TEE);  Surgeon: Sherren Mocha, MD;  Location: [facility];  Service: Open Heart Surgery;  Laterality: N/A;   TOTAL HIP ARTHROPLASTY Bilateral    TRANSCATHETER AORTIC VALVE REPLACEMENT, TRANSFEMORAL N/A 12/30/2018   Procedure: TRANSCATHETER AORTIC VALVE REPLACEMENT, TRANSFEMORAL;  Surgeon: Sherren Mocha, MD;  Location: [facility];  Service: Open Heart Surgery;  Laterality: N/A;   [place]  No outpatient medications have been marked as taking for the 01/21/19 encounter (Appointment) with Eileen Stanford, PA-C.     Allergies:   Patient has no known allergies.   Social History   Tobacco Use   Smoking status: Former Smoker    Packs/day: 1.00    Years: 40.00    Pack years: 40.00    Types: Cigarettes    Last attempt to quit: 03/19/2004    Years since quitting: 14.8   Smokeless tobacco: Never Used  Substance Use Topics   Alcohol use: Yes    Alcohol/week: 3.0 - 4.0 standard drinks    Types: 3 - 4  Glasses of wine per week    Comment: socially   Drug use: No     Family Hx: The patient's family history includes Breast cancer in her mother; Cancer in her brother; Diabetes in her son. There is no history of Colon cancer, CAD, Colon polyps, Rectal cancer, or Stomach cancer.  ROS:   Please see the history of present illness.    All other systems reviewed and are negative.   Labs/Other Tests and Data Reviewed:    Recent Labs: 05/14/2018: TSH 1.12 12/26/2018: ALT 20; B Natriuretic Peptide 65.3 12/31/2018: Magnesium 1.6 01/01/2019: BUN 11; Creatinine, Ser 0.64; Hemoglobin 10.5; Platelets 202; Potassium 4.1; Sodium 139   Recent Lipid Panel Lab Results  Component Value Date/Time   CHOL 156 03/10/2018 11:08 AM   TRIG 61 03/10/2018 11:08 AM   HDL 71 03/10/2018 11:08 AM   CHOLHDL 2.2 03/10/2018 11:08 AM   CHOLHDL 2 11/13/2017 11:27 AM   LDLCALC 73 03/10/2018 11:08 AM    Wt Readings from Last 3 Encounters:  01/08/19 155 lb (70.3 kg)  01/07/19 157 lb 12.8 oz (71.6 kg)  01/01/19 163 lb (73.9 kg)     Exam:    Not completed as visit conducted over the phone  ASSESSMENT & PLAN:    Severe AS s/p TAVR: the patient was initially feeling much better in terms of her breathing after her TAVR. She now has worsening dyspnea on exertion with NYHA class III symptoms. She also reports symptoms consistent with orthopnea but no LE edema or unexplained weight gain. I am not able to assess her volume status over the phone. Given worsening dyspnea and orthopnea, I asked her to come into the office for evaluation and echocardiogram. However, she was very reluctant given the ongoing Covid 19 pandemic. She has some lasix 20mg  at home from an old Rx. I have asked her to take this x3 days. I will call her on Monday to see if she feels any better. If not, she will agree to be seen in person. If she feels better, we will continue to monitor and repeat her echo next month. She understands the need for ongoing SBE  prophylaxis. Plavix can be discontinued after 6 months of therapy.    COVID-19 Education: The signs and symptoms of COVID-19 were discussed with the patient and how to seek care for testing (follow up with PCP or arrange E-visit).  The importance of social distancing was discussed today.  Patient Risk:   After full review of this patients clinical status, I feel that they are at least moderate risk at this time.  Time:   Today, I have spent 15 minutes with the patient with telehealth technology discussing her recovery, new symptoms and assessment and plan.     Medication Adjustments/Labs and Tests Ordered: Current medicines are reviewed at length with the patient  today.  Concerns regarding medicines are outlined above.  Tests Ordered: No orders of the defined types were placed in this encounter.  Medication Changes: No orders of the defined types were placed in this encounter.   Disposition:  3-4 months with Dr. [name]  Signed, Angelena Form, PA-C  01/21/2019 3:33 PM    [place] [place], [place], [place]  [address] Phone: [phone]; Fax: [phone]

## 2019-01-23 ENCOUNTER — Telehealth: Payer: Self-pay

## 2019-01-23 ENCOUNTER — Telehealth: Payer: Self-pay | Admitting: Physician Assistant

## 2019-01-23 DIAGNOSIS — R42 Dizziness and giddiness: Secondary | ICD-10-CM

## 2019-01-23 DIAGNOSIS — R55 Syncope and collapse: Secondary | ICD-10-CM

## 2019-01-23 NOTE — Telephone Encounter (Signed)
Per Nell Range, scheduled patient for urgent echo, event monitor, and office visit with DOD (Dr. Harrington Challenger) on Monday.  The patient understands to arrive at 0800. She was grateful for call and agrees with treatment plan.  Dx: dizziness and pre-syncope     Cardiac Questionnaire:    Since your last visit or hospitalization:    1. Have you been having new or worsening chest pain? No   2. Have you been having new or worsening shortness of breath? No 3. Have you been having new or worsening leg swelling, wt gain, or increase in abdominal girth (pants fitting more tightly)? No   4. Have you had any passing out spells? almost    *A YES to any of these questions would result in the appointment being kept. *If all the answers to these questions are NO, we should indicate that given the current situation regarding the worldwide coronarvirus pandemic, at the recommendation of the CDC, we are looking to limit gatherings in our waiting area, and thus will reschedule their appointment beyond four weeks from today.   _____________   [id number] Pre-Screening Questions:  . Do you currently have a fever? No (yes = cancel and refer to pcp for e-visit) . Have you recently travelled on a cruise, internationally, or to [place], [place], [place], [place], [place], or [place], [place] [employer]) ? No (yes = cancel, stay home, monitor symptoms, and contact pcp or initiate e-visit if symptoms develop) . Have you been in contact with someone that is currently pending confirmation of Covid19 testing or has been confirmed to have the [place] virus?  No (yes = cancel, stay home, away from tested individual, monitor symptoms, and contact pcp or initiate e-visit if symptoms develop) . Are you currently experiencing fatigue or cough? No (yes = pt should be prepared to have a mask placed at the time of their visit).

## 2019-01-23 NOTE — Telephone Encounter (Signed)
Dominique [name] I can see her Tuesday in the office after her echo, if needed

## 2019-01-23 NOTE — Telephone Encounter (Signed)
  [place] VALVE TEAM   Patient called in with worsening symptoms of shortness of breath and also an episode of palpitations with presyncope. We did an e visit earlier this week. She had reported worsening shortness of breath and orthopnea. We trialed a short course of lasix but this did not help at all. I have asked her to stop taking lasix. I will bring in her in the office early next week for an echocardiogram and event monitor on Monday. I have gone over ER precautions with her.   Angelena Form PA-C  [facility]

## 2019-01-26 ENCOUNTER — Encounter: Payer: Self-pay | Admitting: Internal Medicine

## 2019-01-26 ENCOUNTER — Ambulatory Visit (INDEPENDENT_AMBULATORY_CARE_PROVIDER_SITE_OTHER): Payer: Medicare Other | Admitting: Internal Medicine

## 2019-01-26 ENCOUNTER — Ambulatory Visit (INDEPENDENT_AMBULATORY_CARE_PROVIDER_SITE_OTHER): Payer: Medicare Other

## 2019-01-26 ENCOUNTER — Other Ambulatory Visit: Payer: Self-pay

## 2019-01-26 ENCOUNTER — Ambulatory Visit (HOSPITAL_COMMUNITY): Payer: Medicare Other | Attending: Cardiology

## 2019-01-26 VITALS — BP 142/74 | HR 99 | Ht 66.0 in | Wt 157.6 lb

## 2019-01-26 DIAGNOSIS — I998 Other disorder of circulatory system: Secondary | ICD-10-CM

## 2019-01-26 DIAGNOSIS — Z952 Presence of prosthetic heart valve: Secondary | ICD-10-CM | POA: Diagnosis not present

## 2019-01-26 DIAGNOSIS — J449 Chronic obstructive pulmonary disease, unspecified: Secondary | ICD-10-CM | POA: Diagnosis not present

## 2019-01-26 DIAGNOSIS — R55 Syncope and collapse: Secondary | ICD-10-CM | POA: Diagnosis not present

## 2019-01-26 DIAGNOSIS — I10 Essential (primary) hypertension: Secondary | ICD-10-CM

## 2019-01-26 DIAGNOSIS — R42 Dizziness and giddiness: Secondary | ICD-10-CM | POA: Diagnosis not present

## 2019-01-26 DIAGNOSIS — E785 Hyperlipidemia, unspecified: Secondary | ICD-10-CM | POA: Diagnosis not present

## 2019-01-26 NOTE — Patient Instructions (Signed)
Medication Instructions:  No changes If you need a refill on your cardiac medications before your next appointment, please call your pharmacy.   Lab work: No changes If you have labs (blood work) drawn today and your tests are completely normal, you will receive your results only by: Marland Kitchen MyChart Message (if you have MyChart) OR . A paper copy in the mail If you have any lab test that is abnormal or we need to change your treatment, we will call you to review the results.  Testing/Procedures: none  Follow-Up: Dr. Harrington Challenger recommends follow up in about 3 weeks (possibly via E-visit) with Dr. [name].  We will contact you to arrange this.  Any Other Special Instructions Will Be Listed Below (If Applicable).

## 2019-01-26 NOTE — Progress Notes (Signed)
Cardiology Office Note   Date:  01/26/2019   ID:  [name], Dominique [birth year], MRN [id number]  PCP:  Colon Branch, MD  Cardiologist: [name]   Pt presents for eval of SOB     History of Present Illness:  Dominique [name] is a 78 y.o. female with a history of HTN, HLD, COPD and severe ASs/p TAVR (12/30/18)\  Patient presented with a several month history of exertional fatigue and SOB and she was found ot have severe AS by echocardiogram on 10/31/2018, which showed a trileaflet aortic valve with moderate thickening and calcification of the leaflets with restricted mobility. The mean gradient was 39 mmHg with a peak gradient of 64 mmHg. AVA 0.88 cm. Left ventricular ejection fraction was 60 to 65% with grade 1 diastolic dysfunction. She underwent cardiac catheterization on 11/27/2018 showing normal coronary arteries. The mean gradient across aortic valve was measured at 17 mmHg with with a calculated valve area of 1.47 cm. Right heart pressures were normal.  She underwent successfulTAVR with a2mm Medtronic CoreValve Evolut Provia the TF approach on3/12/2018. Post operative echoshowed EF 65%, normally functioning TAVR with mean gradient of 6 mm Hg and no PVL.Post op course was complicated by an ischemic left leg. Dr. [name] took her to the OR foremergency repair of left femoral artery with endarterectomy and Dacron patch angioplasty. She was discharged on POD2 on Aspirin and plavix. Amlodipine 10 mg daily was held at dischargegiven normal BPs.  At 1 week follow up she was feeling much better in terms of her breathing and fatigue. She did continue to have LE pain and she was referred back to vascular for repeat arterial duplex. Leg was found to have good perfusion.   She was scheduled for her 1 month echo and follow up today, but this was converted to a phone visit given the ongoing Covid 19 Pandemic. She reports worsening shortness of breath with minimal exertion, similar to  prior to her TAVR. She denies LE edema but does have symptoms of orthopnea. No PND. No dizziness of syncope. No blood in stool or urine. She has been exercising some with no issues. Her leg is feeling much better.   She discussed with Kathlene November    Set up to get lasix 20 mg for 3 days  ON phone she said this did not help   Set up for echo and event monitor and visit today  The patient says she is feeling better  Breathing is OK   She said she noticed change yesterday     Hsa always been able to lay flat   Denies PND   No CP   No edema Today her breathing is OK One episode of dizziness with standing last week.      Current Meds  Medication Sig  . albuterol (PROVENTIL HFA;VENTOLIN HFA) 108 (90 Base) MCG/ACT inhaler Inhale 2 puffs into the lungs every 6 (six) hours as needed for wheezing.  Marland Kitchen amoxicillin (AMOXIL) 500 MG tablet Take 4 tablets (2,000mg ) one hour prior to all dental visits.  Marland Kitchen aspirin EC 81 MG tablet Take 81 mg by mouth daily.  . benazepril (LOTENSIN) 20 MG tablet Take 1 tablet (20 mg total) by mouth daily.  . Calcium Carb-Cholecalciferol (CALCIUM 600 + D PO) Take 600 mg by mouth 3 (three) times a week.   . Cholecalciferol (VITAMIN D3) 5000 UNITS CAPS Take 5,000 Units by mouth daily.   . clopidogrel (PLAVIX) 75 MG tablet Take 1 tablet (75  mg total) by mouth daily with breakfast.  . denosumab (PROLIA) 60 MG/ML SOLN injection Inject 60 mg into the skin every 6 (six) months. Administer in upper arm, thigh, or abdomen  . EPINEPHrine 0.3 mg/0.3 mL IJ SOAJ injection Inject 0.3 mg into the muscle as needed for anaphylaxis.   . Multiple Vitamin (MULTIVITAMIN WITH MINERALS) TABS tablet Take 1 tablet by mouth daily.  . rosuvastatin (CRESTOR) 40 MG tablet TAKE 1 TABLET BY MOUTH EVERY DAY  . triamcinolone (NASACORT ALLERGY 24HR) 55 MCG/ACT AERO nasal inhaler Place 1 spray into the nose daily as needed (congestion).     Allergies:   Patient has no known allergies.   Past Medical History:   Diagnosis Date  . Emphysema lung ([facility])   . Hyperlipidemia    LDL goal < 70  . Hypertension   . Pulmonary HTN ([facility]) 06/05/2016   Moderate with PASP 79mmHg by echo 04/2016 likely Group 3 from COPD and possibly Group 2 from pulmonary venous HTN associated with moderate AS  . Pulmonary nodules    seen on pre TAVR CT, needs 12 month CT follow up  . 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 Sacral fracture ([facility])   . Severe aortic stenosis   . Thoracic ascending aortic aneurysm [facility])    needs yearly follow up    Past Surgical History:  Procedure Laterality Date  . APPENDECTOMY  1951  . CATARACT EXTRACTION    . CESAREAN SECTION     1971  . COLONOSCOPY  2018  . FEMORAL-POPLITEAL BYPASS GRAFT Left 12/31/2018   Procedure: PATCH ANGIOPLASTY REPAIR LEFT FEMORAL ARTERY USING HEMASHIELD PLATINUM FINESSE PATCH;  Surgeon: Rosetta Posner, MD;  Location: [facility];  Service: Vascular;  Laterality: Left;  . IR RADIOLOGY PERIPHERAL GUIDED IV START  05/30/2017  . IR RADIOLOGY PERIPHERAL GUIDED IV START  09/02/2018  . IR RADIOLOGY PERIPHERAL GUIDED IV START  09/02/2018  . IR US GUIDE VASC ACCESS LEFT  09/02/2018  . IR US GUIDE VASC ACCESS RIGHT  05/30/2017  . IR US GUIDE VASC ACCESS RIGHT  09/02/2018  . JOINT REPLACEMENT  2016   sacroplasty  . LAMINECTOMY    . RIGHT/LEFT HEART CATH AND CORONARY ANGIOGRAPHY N/A 11/27/2018   Procedure: RIGHT/LEFT HEART CATH AND CORONARY ANGIOGRAPHY;  Surgeon: Sherren Mocha, MD;  Location: [facility];  Service: Cardiovascular;  Laterality: N/A;  . TEE WITHOUT CARDIOVERSION N/A 12/30/2018   Procedure: TRANSESOPHAGEAL ECHOCARDIOGRAM (TEE);  Surgeon: Sherren Mocha, MD;  Location: [facility];  Service: Open Heart Surgery;  Laterality: N/A;  . TOTAL HIP ARTHROPLASTY Bilateral   . TRANSCATHETER AORTIC VALVE REPLACEMENT, TRANSFEMORAL N/A 12/30/2018   Procedure:  TRANSCATHETER AORTIC VALVE REPLACEMENT, TRANSFEMORAL;  Surgeon: Sherren Mocha, MD;  Location: [facility];  Service: Open Heart Surgery;  Laterality: N/A;  . TUBAL LIGATION  1975     Social History:  The patient  reports that she quit smoking about 14 years ago. Her smoking use included cigarettes. She has a 40.00 pack-year smoking history. She has never used smokeless tobacco. She reports current alcohol use of about 3.0 - 4.0 standard drinks of alcohol per week. She reports that she does not use drugs.   Family History:  The patient's family history includes Breast cancer in her mother; Cancer in her brother; Diabetes in her son.    ROS:  Please see the  history of present illness. All other systems are reviewed and  Negative to the above problem except as noted.    PHYSICAL EXAM: VS:  BP (!) 142/74   Pulse 99   Ht 5\' 6"  (1.676 m)   Wt 157 lb 9.6 oz (71.5 kg)   SpO2 98%   BMI 25.44 kg/m   GEN: Well nourished, well developed, in no acute distress  HEENT: normal  Neck: no JVD, carotid bruits, or masses Cardiac: RRR; no murmurs, rubs, or gallops,no edema  Respiratory:  clear to auscultation bilaterally, normal work of breathing GI: soft, nontender, nondistended, + BS  No hepatomegaly  MS: no deformity Moving all extremities   Skin: warm and dry, no rash Neuro:  Strength and sensation are intact Psych: euthymic mood, full affect   EKG:  EKG is not ordered today.   Lipid Panel    Component Value Date/Time   CHOL 156 03/10/2018 1108   TRIG 61 03/10/2018 1108   HDL 71 03/10/2018 1108   CHOLHDL 2.2 03/10/2018 1108   CHOLHDL 2 11/13/2017 1127   VLDL 13.2 11/13/2017 1127   LDLCALC 73 03/10/2018 1108      Wt Readings from Last 3 Encounters:  01/26/19 157 lb 9.6 oz (71.5 kg)  01/08/19 155 lb (70.3 kg)  01/07/19 157 lb 12.8 oz (71.6 kg)      ASSESSMENT AND PLAN:  1  Dyspnea   Improving  Yesterday she said she felt better   Today OK Deneis CP   Exam without  volume overload   Echo today is good   Gradients across AV prosthesis are normal   HR and BP are OK     I told the patient to continue activities as tolerated.   She does have some seasonal allergies  I do not think her symptoms are consistent with COVID infection but I have cautioned her to stay at home and take precautions   QUestion if she actually had mild volume increase    Now resolved  HR is OK    Has monitor  Keep on same meds     Activiitdes as tolerated    Will f/u evisit in a few wks    2  HTN   OK  3   HL  ON statin  4  COPD    Breathing OK  Moving air well  5   LE ischemia   S/p L fem artery endarterectonmy    NO complaints     Current medicines are reviewed at length with the patient today.  The patient does not have concerns regarding medicines.  Signed, Dorris Carnes, MD  01/26/2019 11:18 AM    [address], [place],   [address] Phone: [phone]; Fax: [phone]

## 2019-01-27 ENCOUNTER — Telehealth: Payer: Self-pay | Admitting: Family

## 2019-01-27 NOTE — Telephone Encounter (Signed)
Dorian Pod from [facility] called to say that the patient is refusing any Home Health care at this time.   Ashleigh E., LPN.

## 2019-02-02 ENCOUNTER — Encounter: Payer: Self-pay | Admitting: Surgery

## 2019-02-10 ENCOUNTER — Telehealth (HOSPITAL_COMMUNITY): Payer: Self-pay | Admitting: *Deleted

## 2019-02-10 NOTE — Telephone Encounter (Signed)
Called and spoke to pt regarding cardiac rehab referral and continued closure due to the adherence of national recommendations for Covid-19.  Pt is very interested and is looking forward to scheduling when permitted. Cherre Huger, BSN Cardiac and Training and development officer

## 2019-02-19 ENCOUNTER — Other Ambulatory Visit: Payer: Self-pay

## 2019-02-19 ENCOUNTER — Telehealth (INDEPENDENT_AMBULATORY_CARE_PROVIDER_SITE_OTHER): Payer: Medicare Other | Admitting: Physician Assistant

## 2019-02-19 ENCOUNTER — Other Ambulatory Visit: Payer: Self-pay | Admitting: Physician Assistant

## 2019-02-19 ENCOUNTER — Encounter: Payer: Medicare Other | Admitting: Physician Assistant

## 2019-02-19 VITALS — BP 152/84 | HR 94 | Wt 155.0 lb

## 2019-02-19 DIAGNOSIS — I998 Other disorder of circulatory system: Secondary | ICD-10-CM | POA: Diagnosis not present

## 2019-02-19 DIAGNOSIS — Z952 Presence of prosthetic heart valve: Secondary | ICD-10-CM | POA: Diagnosis not present

## 2019-02-19 DIAGNOSIS — Z7189 Other specified counseling: Secondary | ICD-10-CM

## 2019-02-19 DIAGNOSIS — I712 Thoracic aortic aneurysm, without rupture, unspecified: Secondary | ICD-10-CM

## 2019-02-19 DIAGNOSIS — I10 Essential (primary) hypertension: Secondary | ICD-10-CM | POA: Diagnosis not present

## 2019-02-19 DIAGNOSIS — R918 Other nonspecific abnormal finding of lung field: Secondary | ICD-10-CM

## 2019-02-19 MED ORDER — AMLODIPINE BESYLATE 10 MG PO TABS: 10.0000 mg | ORAL_TABLET | Freq: Every day | ORAL | 3 refills | Status: DC

## 2019-02-19 NOTE — Patient Instructions (Signed)
Hello Dominique [name],   It was so nice to talk to you on the phone today. I am so glad you are doing so well. I just wanted to send you a recap of our discussion.   Please continue taking antibiotics prior to any dental work including cleanings (amoxicillin). You can discontinue plavix after 6 months of therapy (around 07/02/19) or when your pills run out (you were given a 90 day supply with one refill). Please continue on aspirin 81 mg indefinitely.   Your 1 month echo has been rescheduled given Covid 19 pandemic.   You will be seen back by your primary cardiologist or NP/PA in 3-4 months. You have been placed on a recall list because Dr. [name] may be on maternity leave when you are due to be seen. Please call the office to arrange a visit if you do not hear from them in the next couple months.   I will see you back in 1 years time with an echo and CT scan to follow up on the mild dilation of your thoracic aorta and pulmonary nodules seen on your pre TAVR CT.   All appointment details are in your upcoming appointments in My Chart.   Please call us with any questions or concerns you may have and please stay safe during these uncertain times.  Nell Range

## 2019-02-19 NOTE — Progress Notes (Addendum)
[facility]   MULTIDISCIPLINARY HEART VALVE TEAM     Virtual Visit via Video Note   This visit type was conducted due to national recommendations for restrictions regarding the COVID-19 Pandemic (e.g. social distancing) in an effort to limit this patient's exposure and mitigate transmission in our community.  Due to her co-morbid illnesses, this patient is at least at moderate risk for complications without adequate follow up.  This format is felt to be most appropriate for this patient at this time.  All issues noted in this document were discussed and addressed.  A limited physical exam was performed with this format.  Please refer to the patient's chart for her consent to telehealth for [facility].   Evaluation Performed:  Follow-up visit  Date:  02/19/2019   ID:  Dominique, [name] Aug 02, 1941, MRN [id number]  Patient Location: Home Provider Location: Office  PCP:  Colon Branch, MD  Cardiologist: Dr. [name] / Dr. [name] & Dr. Cyndia Bent (TAVR) Electrophysiologist:  None   Chief Complaint: follow up   History of Present Illness:    Dominique [name] is a 78 y.o. female with HTN, HLD, COPD and severe ASs/p TAVR (12/30/18) c/b ischemic leg requiring surgical intervention who presents via audio/video conferencing for a telehealth visit today.    The patient does not have symptoms concerning for COVID-19 infection (fever, chills, cough, or new SHORTNESS OF BREATH).   Patient presented with a several month history of exertional fatigue and SOB and she was found ot have severe AS by echocardiogram on 10/31/2018, which showed a trileaflet aortic valve with moderate thickening and calcification of the leaflets with restricted mobility. The mean gradient was 39 mmHg with a peak gradient of 64 mmHg. AVA 0.88 cm. Left ventricular ejection fraction was 60 to 65% with grade 1 diastolic dysfunction. She underwent cardiac catheterization on 11/27/2018 showing normal coronary  arteries. The mean gradient across aortic valve was measured at 17 mmHg with with a calculated valve area of 1.47 cm. Right heart pressures were normal.  She underwent successfulTAVR with a60mm Medtronic CoreValve Evolut Provia the TF approach on3/12/2018. Post operative echoshowed EF 65%, normally functioning TAVR with mean gradient of 6 mm Hg and no PVL.Post op course was complicated by an ischemic left leg. Dr. [name] took her to the OR foremergency repair of left femoral artery with endarterectomy and Dacron patch angioplasty. She was discharged on POD2 on Aspirin and plavix. Amlodipine 10 mg daily was held at dischargegiven normal BPs.  At 1 week follow up she was feeling much better in terms of her breathing and fatigue. She did continue to have LE pain and she was referred back to vascular for repeat arterial duplex. Leg was found to have good perfusion.   She later complained of worsening shortness of breath and palpitations and was seen in the office by Dr. Harrington Challenger on 01/26/19. 1 month echo showed normal EF with normal valve function (mean gradient 13.1 mm Hg and no PVL). A heart monitor was placed. She was felt to have seasonal allergies.   Today she presents for a telehealth visit. Feeling much better with no complaints. No more episodes of dizziness, shortness of breath or palpitations. No chest pain. Still wearing the monitor. She is a little concerned about her BP as it has been running higher lately.    Past Medical History:  Diagnosis Date   Emphysema lung (HCC)    Hyperlipidemia    LDL goal < 70  Hypertension    Pulmonary HTN ([place]) 06/05/2016   Moderate with PASP 59mmHg by echo 04/2016 likely Group 3 from COPD and possibly Group 2 from pulmonary venous HTN associated with moderate AS   Pulmonary nodules    seen on pre TAVR CT, needs 12 month CT follow up   S/P TAVR (transcatheter aortic valve replacement) 12/30/2018   Medtronic Evolut Pro-Plus THV (size 26 mm,  serial # [id number]) via the TF approach   Sacral fracture (HCC)    Severe aortic stenosis    Thoracic ascending aortic aneurysm ([place])    needs yearly follow up   Past Surgical History:  Procedure Laterality Date   [place]   COLONOSCOPY  2018   FEMORAL-POPLITEAL BYPASS GRAFT Left 12/31/2018   Procedure: PATCH ANGIOPLASTY REPAIR LEFT FEMORAL ARTERY USING [place];  Surgeon: Rosetta Posner, MD;  Location: [facility];  Service: Vascular;  Laterality: Left;   IR RADIOLOGY PERIPHERAL GUIDED IV START  05/30/2017   IR RADIOLOGY PERIPHERAL GUIDED IV START  09/02/2018   IR RADIOLOGY PERIPHERAL GUIDED IV START  09/02/2018   IR US GUIDE VASC ACCESS LEFT  09/02/2018   IR US GUIDE VASC ACCESS RIGHT  05/30/2017   IR US GUIDE VASC ACCESS RIGHT  09/02/2018   JOINT REPLACEMENT  2016   sacroplasty   LAMINECTOMY     RIGHT/LEFT HEART CATH AND CORONARY ANGIOGRAPHY N/A 11/27/2018   Procedure: RIGHT/LEFT HEART CATH AND CORONARY ANGIOGRAPHY;  Surgeon: Sherren Mocha, MD;  Location: [facility];  Service: Cardiovascular;  Laterality: N/A;   TEE WITHOUT CARDIOVERSION N/A 12/30/2018   Procedure: TRANSESOPHAGEAL ECHOCARDIOGRAM (TEE);  Surgeon: Sherren Mocha, MD;  Location: [facility];  Service: Open Heart Surgery;  Laterality: N/A;   TOTAL HIP ARTHROPLASTY Bilateral    TRANSCATHETER AORTIC VALVE REPLACEMENT, TRANSFEMORAL N/A 12/30/2018   Procedure: TRANSCATHETER AORTIC VALVE REPLACEMENT, TRANSFEMORAL;  Surgeon: Sherren Mocha, MD;  Location: [facility];  Service: Open Heart Surgery;  Laterality: N/A;   TUBAL LIGATION  1975     Current Meds  Medication Sig   albuterol (PROVENTIL HFA;VENTOLIN HFA) 108 (90 Base) MCG/ACT inhaler Inhale 2 puffs into the lungs every 6 (six) hours as needed for wheezing.   amLODipine (NORVASC) 10 MG tablet Take 1 tablet (10 mg total) by mouth daily.   amoxicillin  (AMOXIL) 500 MG tablet Take 4 tablets (2,000mg ) one hour prior to all dental visits.   aspirin EC 81 MG tablet Take 81 mg by mouth daily.   benazepril (LOTENSIN) 20 MG tablet Take 1 tablet (20 mg total) by mouth daily.   Calcium Carb-Cholecalciferol (CALCIUM 600 + D PO) Take 600 mg by mouth 3 (three) times a week.    Cholecalciferol (VITAMIN D3) 5000 UNITS CAPS Take 5,000 Units by mouth daily.    clopidogrel (PLAVIX) 75 MG tablet Take 1 tablet (75 mg total) by mouth daily with breakfast.   denosumab (PROLIA) 60 MG/ML SOLN injection Inject 60 mg into the skin every 6 (six) months. Administer in upper arm, thigh, or abdomen   EPINEPHrine 0.3 mg/0.3 mL IJ SOAJ injection Inject 0.3 mg into the muscle as needed for anaphylaxis.    Multiple Vitamin (MULTIVITAMIN WITH MINERALS) TABS tablet Take 1 tablet by mouth daily.   rosuvastatin (CRESTOR) 40 MG tablet TAKE 1 TABLET BY MOUTH EVERY DAY   triamcinolone (NASACORT ALLERGY 24HR) 55 MCG/ACT AERO  nasal inhaler Place 1 spray into the nose daily as needed (congestion).     Allergies:   Patient has no known allergies.   Social History   Tobacco Use   Smoking status: Former Smoker    Packs/day: 1.00    Years: 40.00    Pack years: 40.00    Types: Cigarettes    Last attempt to quit: 03/19/2004    Years since quitting: 14.9   Smokeless tobacco: Never Used  Substance Use Topics   Alcohol use: Yes    Alcohol/week: 3.0 - 4.0 standard drinks    Types: 3 - 4 Glasses of wine per week    Comment: socially   Drug use: No     Family Hx: The patient's family history includes Breast cancer in her mother; Cancer in her brother; Diabetes in her son. There is no history of Colon cancer, CAD, Colon polyps, Rectal cancer, or Stomach cancer.  ROS:   Please see the history of present illness.    All other systems reviewed and are negative.   Prior CV studies:   The following studies were reviewed today:   TAVR OPERATIVE NOTE  Date of  Procedure:12/30/2018  Preoperative Diagnosis:Severe Aortic Stenosis   Postoperative Diagnosis:Same   Procedure:   Transcatheter Aortic Valve Replacement - PercutaneousRightTransfemoral Approach Medtronic CoreValve Evolut Pro +(size 66mm, serial # [id number])  Co-Surgeons:Bryan Alveria Apley, MD and Sherren Mocha, MD   Anesthesiologist:Ryan Roanna Banning, MD  Dala Dock, MD  Pre-operative Echo Findings: ? Severe aortic stenosis ? Normal left ventricular systolic function  Post-operative Echo Findings: ? Noparavalvular leak ? Normal left ventricular systolic function  _____________  Echo 12/31/18 IMPRESSIONS 1. The left ventricle has hyperdynamic systolic function, with an ejection fraction of >65%. The cavity size was normal. There is moderately increased left ventricular wall thickness. Left ventricular diastolic Doppler parameters are consistent with  impaired relaxation Indeterminent filling pressures No evidence of left ventricular regional wall motion abnormalities. 2. The right ventricle has normal systolic function. The cavity was normal. There is no increase in right ventricular wall thickness. 3. Left atrial size was mildly dilated. 4. The mitral valve is degenerative. Mild calcification of the mitral valve leaflet. 5. The aortic root and ascending aorta are normal in size and structure. 6. The inferior vena cava was dilated in size with <50% respiratory variability. 7. No evidence of left ventricular regional wall motion abnormalities. 8. When compared to the prior study: 10/30/2018 - 60-65%, severe AS.  Aortic Valve: The aortic valve has been repaired/replaced Aortic valve regurgitation was not visualized by color flow Doppler. There is no evidence of aortic valve stenosis.  _____________   Echo 01/26/19 IMPRESSIONS  1. The left  ventricle has normal systolic function with an ejection fraction of 60-65%. The cavity size was normal. Left ventricular diastolic parameters were normal.  2. The right ventricle has normal systolic function. The cavity was normal. There is no increase in right ventricular wall thickness.  3. No evidence present in the left atrial appendage.  4. Mild thickening of the mitral valve leaflet.  5. Post TAVR with 26 mm Medtronic Core Valve Mean gradient low and stable since echo done 12/31/18 and no significant PVL.  6. No pulmonic valve vegetation visualized.  FINDINGS  Left Ventricle: The left ventricle has normal systolic function, with an ejection fraction of 60-65%. The cavity size was normal. There is no increase in left ventricular wall thickness. Left ventricular diastolic parameters were normal. Right Ventricle: The right ventricle has normal  systolic function. The cavity was normal. There is no increase in right ventricular wall thickness. Left Atrium: left atrial size was normal in size Left Atrial Appendage: No evidence of a thrombus present in the left atrial appendage. Right Atrium: right atrial size was normal in size. Right atrial pressure is estimated at 3 mmHg. Interatrial Septum: No atrial level shunt detected by color flow Doppler. Pericardium: There is no evidence of pericardial effusion. Mitral Valve: The mitral valve is normal in structure. Mild thickening of the mitral valve leaflet. Mitral valve regurgitation is not visualized by color flow Doppler. Tricuspid Valve: The tricuspid valve is normal in structure. Tricuspid valve regurgitation is trivial by color flow Doppler. Aortic Valve: The aortic valve has been repaired/replaced Aortic valve regurgitation was not assessed by color flow Doppler. There is no evidence of aortic valve stenosis. Post TAVR with 26 mm Medtronic Core Valve Mean gradient low and stable since echo  done 12/31/18 and no significant PVL. Pulmonic Valve: The  pulmonic valve was grossly normal. Pulmonic valve regurgitation is trivial by color flow Doppler. No pulmonic valve vegetation visualized. Venous: The inferior vena cava is normal in size with greater than 50% respiratory variability.  Labs/Other Tests and Data Reviewed:    EKG:  No ECG reviewed.  Recent Labs: 05/14/2018: TSH 1.12 12/26/2018: ALT 20; B Natriuretic Peptide 65.3 12/31/2018: Magnesium 1.6 01/01/2019: BUN 11; Creatinine, Ser 0.64; Hemoglobin 10.5; Platelets 202; Potassium 4.1; Sodium 139   Recent Lipid Panel Lab Results  Component Value Date/Time   CHOL 156 03/10/2018 11:08 AM   TRIG 61 03/10/2018 11:08 AM   HDL 71 03/10/2018 11:08 AM   CHOLHDL 2.2 03/10/2018 11:08 AM   CHOLHDL 2 11/13/2017 11:27 AM   LDLCALC 73 03/10/2018 11:08 AM    Wt Readings from Last 3 Encounters:  02/19/19 155 lb (70.3 kg)  01/26/19 157 lb 9.6 oz (71.5 kg)  01/08/19 155 lb (70.3 kg)     Objective:    Vital Signs:  BP (!) 152/84    Pulse 94    Wt 155 lb (70.3 kg)    BMI 25.02 kg/m    Well nourished, well developed female in no  acute distress. Appears well.    ASSESSMENT & PLAN:    Severe AS s/p TAVR: 1 month echo showed normal valve function with mean gradient 13mg  Hg and no PVL She has NYHA class I symptoms. SBE prophylaxis discussed; she has amoxicillin. Plavix can be discontinued after 6 months of therapy (06/2019). I will see her back in 1 year for echo, CT chest and follow up  HTN: BP has been elevated. Home amlodipine 10mg  daily was discontinued at discharge after TAVR. Will plan to resume that now.  Palpations/presyncope: no further episodes. Still wearing heart monitor.   Ectasia of the ascending thoracic aorta:  (4.0 cm in diameter). Recommend annual imaging followup by CTA or MRA. CT scheduled for 12/2019.  Pulmonary nodules: multiple small pulmonary nodules measuring 4 mm or less in size scattered throughout the lungs bilaterally. These are nonspecific, but statistically likely  benign. She has a long smoking history. CT scan scheduled 12/2019.   Ischemic left leg s/p left femoral artery endarterectomy and Dacron patch angioplasty: no leg pain. Continue follow up with [facility].   COVID-19 Education: the signs and symptoms of COVID-19 were discussed with the patient and how to seek care for testing (follow up with PCP or arrange E-visit).  The importance of social distancing was discussed today.  Time:  Today, I have spent 25 minutes with the patient with telehealth technology discussing the above problems.     Medication Adjustments/Labs and Tests Ordered: Current medicines are reviewed at length with the patient today.  Concerns regarding medicines are outlined above.   Tests Ordered: Orders Placed This Encounter  Procedures   CT Chest Wo Contrast    Medication Changes: Meds ordered this encounter  Medications   amLODipine (NORVASC) 10 MG tablet    Sig: Take 1 tablet (10 mg total) by mouth daily.    Dispense:  180 tablet    Refill:  3    Order Specific Question:   Supervising Provider    Answer:   [name], MICHAEL [[id number]]    Disposition:  Follow up Dr. [name] or APP in 3-4 months.   Signed, Angelena Form, PA-C  02/19/2019 2:20 PM     [facility]

## 2019-02-22 ENCOUNTER — Telehealth: Payer: Self-pay | Admitting: Internal Medicine

## 2019-02-22 NOTE — Telephone Encounter (Signed)
I was contacted by [employer] re: critical result on patient's home cardiac monitor. She was noted to have 7 seconds of ventricular tachycardia at a rate of 263 bpm with underline sinus tachycardia.   I was able to contact the patient right away on her cell phone. We spoke for about 5 minutes.  She tells me she is feeling fine, slept in today and is currently baking a cake. She had no symptoms at all, and was unaware anything had occurred. She tells me she I almost done with the monitor, and will be returning it soon for interpretation. Her vital signs last night were HR 105 and BP 120/70.   The patient denies chest pain, chest pressure, dyspnea at rest or with exertion, palpitations, PND, orthopnea, or leg swelling. Denies syncope or presyncope. Denies dizziness or lightheadedness.  I have informed her I will pass this along to her cardiology team, and that if she has any red flag symptoms of chest pain, chest pressure, SOB that does not resolve, lightheadedness/dizziness or fainting, she should present to the ER. She understands and will do so.

## 2019-02-23 NOTE — Telephone Encounter (Signed)
Strips received and shown to [employer].  No new orders at this time. Will await final read from Dr. [name] when monitor is complete.

## 2019-02-27 ENCOUNTER — Telehealth: Payer: Self-pay | Admitting: Cardiovascular Disease

## 2019-02-27 NOTE — Telephone Encounter (Signed)
Mychart, smartphone. Pre reg complete 02/27/19 AF

## 2019-03-02 ENCOUNTER — Telehealth (INDEPENDENT_AMBULATORY_CARE_PROVIDER_SITE_OTHER): Payer: Medicare Other | Admitting: Cardiovascular Disease

## 2019-03-02 ENCOUNTER — Encounter: Payer: Self-pay | Admitting: Cardiovascular Disease

## 2019-03-02 DIAGNOSIS — I10 Essential (primary) hypertension: Secondary | ICD-10-CM

## 2019-03-02 DIAGNOSIS — Z952 Presence of prosthetic heart valve: Secondary | ICD-10-CM

## 2019-03-02 DIAGNOSIS — I2510 Atherosclerotic heart disease of native coronary artery without angina pectoris: Secondary | ICD-10-CM

## 2019-03-02 DIAGNOSIS — I4729 Other ventricular tachycardia: Secondary | ICD-10-CM | POA: Insufficient documentation

## 2019-03-02 DIAGNOSIS — I472 Ventricular tachycardia: Secondary | ICD-10-CM

## 2019-03-02 HISTORY — DX: Ventricular tachycardia: I47.2

## 2019-03-02 HISTORY — DX: Other ventricular tachycardia: I47.29

## 2019-03-02 MED ORDER — MAGNESIUM OXIDE 400 (241.3 MG) MG PO TABS: 400.0000 mg | ORAL_TABLET | Freq: Every day | ORAL | 1 refills | Status: DC

## 2019-03-02 MED ORDER — METOPROLOL SUCCINATE ER 25 MG PO TB24: 25.0000 mg | ORAL_TABLET | Freq: Every day | ORAL | 1 refills | Status: DC

## 2019-03-02 NOTE — Patient Instructions (Addendum)
Medication Instructions:   START METOPROLOL SUCC 25 MG DAILY  START MAGNESIUM OXIDE 400 MG DAILY   If you need a refill on your cardiac medications before your next appointment, please call your pharmacy.   Lab work: NONE  Testing/Procedures: NONE  Follow-Up:  Your physician recommends that you schedule a follow-up appointment in:1 [place] OR K LAWRENCE DNP 04/02/19 AT 10:00  At [facility], you and your health needs are our priority.  As part of our continuing mission to provide you with exceptional heart care, we have created designated Provider Care Teams.  These Care Teams include your primary Cardiologist (physician) and Advanced Practice Providers (APPs -  Physician Assistants and Nurse Practitioners) who all work together to provide you with the care you need, when you need it. You will need a follow up appointment in 6 months.  Please call our office 2 months in advance to schedule this appointment.  You may see DR [facility] or one of the following Advanced Practice Providers on your designated Care Team:   Kerin Ransom, PA-C Roby Lofts, [place] . Sande Rives, PA-C

## 2019-03-02 NOTE — Progress Notes (Signed)
Virtual Visit via Video Note   This visit type was conducted due to national recommendations for restrictions regarding the COVID-19 Pandemic (e.g. social distancing) in an effort to limit this patient's exposure and mitigate transmission in our community.  Due to her co-morbid illnesses, this patient is at least at moderate risk for complications without adequate follow up.  This format is felt to be most appropriate for this patient at this time.  All issues noted in this document were discussed and addressed.  A limited physical exam was performed with this format.  Please refer to the patient's chart for her consent to telehealth for [facility].   Date:  03/02/2019   ID:  Dominique [name], DOB 1941/07/04, MRN [id number]  Patient Location: Home Provider Location: Office  PCP:  Colon Branch, MD  Cardiologist:  [name], MD  Interventionalist: Dr. [name]  Evaluation Performed:  Follow-Up Visit  Chief Complaint:  Follow up  History of Present Illness:    Dominique [name] is a  78 y.o. female with severe aortic stenosis s/p TAVR, non-obstructive CAD, hyperlipidemia, moderate TR, mild pulmonary hypertension, hypertension, and severe COPD here for follow-up. Dominique [name] saw Dr. Radford Pax on 05/2017.  She was noted to have moderate aortic stenosis (peak velocity 3.1 m/s, mean gradient 21 mmHg).  She had a chest CT 11/2016 that showed coronary calcifications.  She subsequently had a coronary CT-A 05/2017 that revealed less than 50% plaque in the proximal LAD and a mild ascending aortic aneurysm (3.9 cm).  She was noted to have calcification of the aorta and aortic valve.  Echo 05/31/17 that revealed LVEF 60-65% with normal diastolic function.  She had a repeat echo 05/2018 that showed her mean aortic valve gradient increased to 36 mmHg.  She continued to report chest discomfort so she had a repeat coronary CT-A 08/2018 that revealed 25-49% stenosis of the proximal LAD unchanged from prior.    She  reported increasing exertional dyspnea and chest discomfort.  She was referred to Dr. [name] and underwent TAVR 51mm Medtronic CoreValve Evolut Proon 12/2018.  Postoperative echo revealed a mean gradient of 6 mmHg.  However this procedure was complicated by an ischemic left leg.  She went to the OR emergently with Dr. [name] for repair of the left femoral artery with endarterectomy and dacryon patch angioplasty.  She reported shortness of breath at the procedure and was seen by Dr. Harrington Challenger on 3/30.  By that time her breathing had improved.  Her symptoms were thought to be more related to seasonal allergies and her COPD.  She has noted some intermittent palpitations and wore an ambulatory monitor last month.  The results are not yet available.  While on the monitor she was noted to have a 7-second run of ventricular tachycardia.  She was asymptomatic and making a cake at the time.  She does note some palpitations when she gets anxious.  Yesterday she got lost and could not figure out how to use her GPS.  Her heart was pounding when this occurred.  She also sometimes has palpitations when lying in bed.  She has not experienced any shortness of breath or chest pain.  She had a cardiac catheterization prior to TAVR that showed normal coronary arteries.  She denies any lower extremity edema, orthopnea, or PND.  She has not experienced any lightheadedness, dizziness, or syncope.  Overall she is feeling quite well.  The patient does not have symptoms concerning for COVID-19 infection (fever, chills, cough,  or new shortness of breath).    Past Medical History:  Diagnosis Date   Emphysema lung ([place])    Hyperlipidemia    LDL goal < 70   Hypertension    NSVT (nonsustained ventricular tachycardia) ([place]) 03/02/2019   Pulmonary HTN ([place]) 06/05/2016   Moderate with PASP 82mmHg by echo 04/2016 likely Group 3 from COPD and possibly Group 2 from pulmonary venous HTN associated with moderate AS   Pulmonary nodules     seen on pre TAVR CT, needs 12 month CT follow up   S/P TAVR (transcatheter aortic valve replacement) 12/30/2018   Medtronic Evolut Pro-Plus THV (size 26 mm, serial # [id number]) via the TF approach   Sacral fracture ([facility])    Severe aortic stenosis    Thoracic ascending aortic aneurysm ([place])    needs yearly follow up   Past Surgical History:  Procedure Laterality Date   [place]   COLONOSCOPY  2018   FEMORAL-POPLITEAL BYPASS GRAFT Left 12/31/2018   Procedure: PATCH ANGIOPLASTY REPAIR LEFT FEMORAL ARTERY USING [place];  Surgeon: Rosetta Posner, MD;  Location: [place];  Service: Vascular;  Laterality: Left;   IR RADIOLOGY PERIPHERAL GUIDED IV START  05/30/2017   IR RADIOLOGY PERIPHERAL GUIDED IV START  09/02/2018   IR RADIOLOGY PERIPHERAL GUIDED IV START  09/02/2018   IR US GUIDE VASC ACCESS LEFT  09/02/2018   IR US GUIDE VASC ACCESS RIGHT  05/30/2017   IR US GUIDE VASC ACCESS RIGHT  09/02/2018   JOINT REPLACEMENT  2016   sacroplasty   LAMINECTOMY     RIGHT/LEFT HEART CATH AND CORONARY ANGIOGRAPHY N/A 11/27/2018   Procedure: RIGHT/LEFT HEART CATH AND CORONARY ANGIOGRAPHY;  Surgeon: Sherren Mocha, MD;  Location: [place] [facility];  Service: Cardiovascular;  Laterality: N/A;   TEE WITHOUT CARDIOVERSION N/A 12/30/2018   Procedure: TRANSESOPHAGEAL ECHOCARDIOGRAM (TEE);  Surgeon: Sherren Mocha, MD;  Location: [place] [facility];  Service: Open Heart Surgery;  Laterality: N/A;   TOTAL HIP ARTHROPLASTY Bilateral    TRANSCATHETER AORTIC VALVE REPLACEMENT, TRANSFEMORAL N/A 12/30/2018   Procedure: TRANSCATHETER AORTIC VALVE REPLACEMENT, TRANSFEMORAL;  Surgeon: Sherren Mocha, MD;  Location: [place] [facility];  Service: Open Heart Surgery;  Laterality: N/A;   TUBAL LIGATION  1975     Current Meds  Medication Sig   albuterol (PROVENTIL HFA;VENTOLIN HFA) 108 (90 Base) MCG/ACT inhaler Inhale 2 puffs  into the lungs every 6 (six) hours as needed for wheezing.   amLODipine (NORVASC) 10 MG tablet Take 1 tablet (10 mg total) by mouth daily.   amoxicillin (AMOXIL) 500 MG tablet Take 4 tablets (2,000mg ) one hour prior to all dental visits.   aspirin EC 81 MG tablet Take 81 mg by mouth daily.   benazepril (LOTENSIN) 20 MG tablet Take 1 tablet (20 mg total) by mouth daily.   Calcium Carb-Cholecalciferol (CALCIUM 600 + D PO) Take 600 mg by mouth 3 (three) times a week.    Cholecalciferol (VITAMIN D3) 5000 UNITS CAPS Take 5,000 Units by mouth daily.    clopidogrel (PLAVIX) 75 MG tablet Take 1 tablet (75 mg total) by mouth daily with breakfast.   denosumab (PROLIA) 60 MG/ML SOLN injection Inject 60 mg into the skin every 6 (six) months. Administer in upper arm, thigh, or abdomen   EPINEPHrine 0.3 mg/0.3 mL IJ SOAJ injection Inject 0.3 mg into the muscle as needed for  anaphylaxis.    loratadine (CLARITIN) 10 MG tablet Take 10 mg by mouth daily.   Multiple Vitamin (MULTIVITAMIN WITH MINERALS) TABS tablet Take 1 tablet by mouth daily.   rosuvastatin (CRESTOR) 40 MG tablet TAKE 1 TABLET BY MOUTH EVERY DAY   triamcinolone (NASACORT ALLERGY 24HR) 55 MCG/ACT AERO nasal inhaler Place 1 spray into the nose daily as needed (congestion).     Allergies:   Patient has no known allergies.   Social History   Tobacco Use   Smoking status: Former Smoker    Packs/day: 1.00    Years: 40.00    Pack years: 40.00    Types: Cigarettes    Last attempt to quit: 03/19/2004    Years since quitting: 14.9   Smokeless tobacco: Never Used  Substance Use Topics   Alcohol use: Yes    Alcohol/week: 3.0 - 4.0 standard drinks    Types: 3 - 4 Glasses of wine per week    Comment: socially   Drug use: No     Family Hx: The patient's family history includes Breast cancer in her mother; Cancer in her brother; Diabetes in her son. There is no history of Colon cancer, CAD, Colon polyps, Rectal cancer, or Stomach  cancer.  ROS:   Please see the history of present illness.     All other systems reviewed and are negative.   Prior CV studies:   The following studies were reviewed today:  Echo 12/31/18 IMPRESSIONS 1. The left ventricle has hyperdynamic systolic function, with an ejection fraction of >65%. The cavity size was normal. There is moderately increased left ventricular wall thickness. Left ventricular diastolic Doppler parameters are consistent with  impaired relaxation Indeterminent filling pressures No evidence of left ventricular regional wall motion abnormalities. 2. The right ventricle has normal systolic function. The cavity was normal. There is no increase in right ventricular wall thickness. 3. Left atrial size was mildly dilated. 4. The mitral valve is degenerative. Mild calcification of the mitral valve leaflet. 5. The aortic root and ascending aorta are normal in size and structure. 6. The inferior vena cava was dilated in size with <50% respiratory variability. 7. No evidence of left ventricular regional wall motion abnormalities. 8. When compared to the prior study: 10/30/2018 - 60-65%, severe AS.  Aortic Valve: The aortic valve has been repaired/replaced Aortic valve regurgitation was not visualized by color flow Doppler. There is no evidence of aortic valve stenosis.  _____________   Echo 01/26/19 IMPRESSIONS 1. The left ventricle has normal systolic function with an ejection fraction of 60-65%. The cavity size was normal. Left ventricular diastolic parameters were normal. 2. The right ventricle has normal systolic function. The cavity was normal. There is no increase in right ventricular wall thickness. 3. No evidence present in the left atrial appendage. 4. Mild thickening of the mitral valve leaflet. 5. Post TAVR with 26 mm Medtronic Core Valve Mean gradient low and stable since echo done 12/31/18 and no significant PVL. 6. No pulmonic valve vegetation  visualized.   Labs/Other Tests and Data Reviewed:    EKG:  No ECG reviewed.  Recent Labs: 05/14/2018: TSH 1.12 12/26/2018: ALT 20; B Natriuretic Peptide 65.3 12/31/2018: Magnesium 1.6 01/01/2019: BUN 11; Creatinine, Ser 0.64; Hemoglobin 10.5; Platelets 202; Potassium 4.1; Sodium 139   Recent Lipid Panel Lab Results  Component Value Date/Time   CHOL 156 03/10/2018 11:08 AM   TRIG 61 03/10/2018 11:08 AM   HDL 71 03/10/2018 11:08 AM   CHOLHDL 2.2 03/10/2018 11:08  AM   CHOLHDL 2 11/13/2017 11:27 AM   LDLCALC 73 03/10/2018 11:08 AM    Wt Readings from Last 3 Encounters:  02/19/19 155 lb (70.3 kg)  01/26/19 157 lb 9.6 oz (71.5 kg)  01/08/19 155 lb (70.3 kg)     Objective:    BP 131/73    Pulse 89    Ht 5\' 6"  (1.676 m)    BMI 25.02 kg/m  GENERAL: Well-appearing.  No acute distress. HEENT: Pupils equal round.  Oral mucosa unremarkable NECK:  No jugular venous distention, no visible thyromegaly EXT:  No edema, no cyanosis no clubbing SKIN:  No rashes no nodules NEURO:  Speech fluent.  Cranial nerves grossly intact.  Moves all 4 extremities freely PSYCH:  Cognitively intact, oriented to person place and time   ASSESSMENT & PLAN:    # Hyperlipidemia: LDL 73 on 02/2018.  Continue rosuvastatin.  # Severe aortic stenosis:  Ms. Mundorf is doing very well after TAVR.  Her procedure was complicated by vascular access issues requiring emergent surgery.  She is healing well from this.  She notices no significant improvement in her breathing and chest discomfort since having TAVR.  Mean gradient was 7 mmHg on echo 12/2018.  # NSVT:   Reportedly had a 7-second run of NSVT on her ambulatory monitor.  The results are not yet available.  She was hypomagnesemic in the hospital.  We will start magnesium 400 mg daily.  She has normal systolic function and no CAD on cath.  Start metoprolol succinate 25 mg daily.  # Mild ascending aorta aneurysm: Stable.  3.8 cm 08/2018.  # Hypertension: BP is  well-controlled.  Continue amlodipine and benazepril.  We will add metoprolol for palpitations and NSVT.  # Non-obstructive coronary calcification:  Continue aspirin and  Rosuvastatin.  <50%  LAD disease on coronary CT-A in 2018 and 2019 but normal coronaries on cath in 2020.  # Mild pulmonary hypertension: Likely 2/2 COPD.  She is euvolemic.  COVID-19 Education: The signs and symptoms of COVID-19 were discussed with the patient and how to seek care for testing (follow up with PCP or arrange E-visit).  The importance of social distancing was discussed today.  Time:   Today, I have spent 21 minutes with the patient with telehealth technology discussing the above problems.     Medication Adjustments/Labs and Tests Ordered: Current medicines are reviewed at length with the patient today.  Concerns regarding medicines are outlined above.   Tests Ordered: No orders of the defined types were placed in this encounter.   Medication Changes: Meds ordered this encounter  Medications   metoprolol succinate (TOPROL XL) 25 MG 24 hr tablet    Sig: Take 1 tablet (25 mg total) by mouth daily.    Dispense:  90 tablet    Refill:  1   magnesium oxide (MAG-OX) 400 (241.3 Mg) MG tablet    Sig: Take 1 tablet (400 mg total) by mouth daily.    Dispense:  90 tablet    Refill:  1    Disposition:  Follow up in 6 month(s).  APP in 1 month for BP and palpitations  Signed, [name], MD  03/02/2019 12:03 PM    [place]

## 2019-03-16 ENCOUNTER — Telehealth (HOSPITAL_COMMUNITY): Payer: Self-pay | Admitting: *Deleted

## 2019-03-16 NOTE — Telephone Encounter (Signed)
Follow-up call made to patient, and patient states she's received exercise and nutrition information from cardiac rehab in the mail. Pt states that she recovering well and is "back to normal". Pt is gardening and planting without issue. Patient is walking dog 4x's /day: 1 long walk and 3 short walks totaling 30 minutes/day. Encouraged patient to include warm-up/cool-down stretches before and after walk, and pt is amenable to this. Pt states she enjoys walking and swimming when able.  Pt is also amenable to e-mail communication. Will continue to follow.  Sol Passer, MS, ACSM CEP [phone]

## 2019-03-18 ENCOUNTER — Telehealth: Payer: Self-pay | Admitting: Physician Assistant

## 2019-03-18 NOTE — Telephone Encounter (Signed)
Patient would like results to her holter monitor

## 2019-03-18 NOTE — Telephone Encounter (Signed)
The patient wore a [employer] monitor from 3/30-4/28/20.  Study Highlights   The basic rhythm is normal sinus There are no bradycardic events There is no atrial fibrillation or flutter There are episodes of a wide complex that appears to be artifactual   Will forward to Dr. [name] to review prior to calling the patient back to make sure she has no other recommendations for the patient at this time.

## 2019-03-19 NOTE — Telephone Encounter (Signed)
Result given to patient , aware of f/u on 04/01/19 with lawrence DNP

## 2019-03-19 NOTE — Telephone Encounter (Signed)
Patient returned your call.

## 2019-03-19 NOTE — Telephone Encounter (Signed)
Left message to call back  

## 2019-03-19 NOTE — Telephone Encounter (Signed)
This monitor was ordered and ready by Dr. [name].  No arrhythmias.

## 2019-03-25 ENCOUNTER — Telehealth: Payer: Self-pay

## 2019-03-25 NOTE — Telephone Encounter (Signed)
Copied from [place] [id number]. Topic: Appointment Scheduling - Scheduling Inquiry for Clinic >> Mar 20, 2019 10:15 AM Lennox Solders wrote: Reason for CRM:  pt is calling and would like to schedule her prolia injection >> Mar 20, 2019 10:16 AM Damita Dunnings, CMA wrote: Pt also overdue for routine f/u w/ PCP.

## 2019-03-25 NOTE — Telephone Encounter (Signed)
Scheduled virtual visit with pcp and scheduled prolia injection for tomorrow.

## 2019-03-26 ENCOUNTER — Ambulatory Visit (INDEPENDENT_AMBULATORY_CARE_PROVIDER_SITE_OTHER): Payer: Medicare Other

## 2019-03-26 ENCOUNTER — Other Ambulatory Visit: Payer: Self-pay

## 2019-03-26 DIAGNOSIS — M810 Age-related osteoporosis without current pathological fracture: Secondary | ICD-10-CM

## 2019-03-26 MED ORDER — DENOSUMAB 60 MG/ML ~~LOC~~ SOSY
60.0000 mg | PREFILLED_SYRINGE | Freq: Once | SUBCUTANEOUS | Status: AC
  Administered 2019-03-26: 60 mg via SUBCUTANEOUS

## 2019-03-26 NOTE — Progress Notes (Addendum)
Pre visit review using our clinic review tool, if applicable. No additional management support is needed unless otherwise documented below in the visit note.  Patient here for prolia injection. 66mL prolia injected into patients left upper arm SQ. Patient tolerated well. Due in 6 months around 09/27/2019.   Kathlene November, MD

## 2019-03-27 ENCOUNTER — Telehealth: Payer: Self-pay | Admitting: Internal Medicine

## 2019-03-27 ENCOUNTER — Ambulatory Visit (INDEPENDENT_AMBULATORY_CARE_PROVIDER_SITE_OTHER): Payer: Medicare Other | Admitting: Internal Medicine

## 2019-03-27 DIAGNOSIS — Z952 Presence of prosthetic heart valve: Secondary | ICD-10-CM

## 2019-03-27 DIAGNOSIS — J449 Chronic obstructive pulmonary disease, unspecified: Secondary | ICD-10-CM

## 2019-03-27 DIAGNOSIS — I10 Essential (primary) hypertension: Secondary | ICD-10-CM | POA: Diagnosis not present

## 2019-03-27 DIAGNOSIS — E7800 Pure hypercholesterolemia, unspecified: Secondary | ICD-10-CM

## 2019-03-27 DIAGNOSIS — I2510 Atherosclerotic heart disease of native coronary artery without angina pectoris: Secondary | ICD-10-CM | POA: Diagnosis not present

## 2019-03-27 NOTE — Telephone Encounter (Signed)
Return in about 3 months (around 06/27/2019) for routine visit , 20 minutes, fasting.    Schedule a face-to-face office visit in 3 to 4 months   Left msg. To call back for follow up

## 2019-03-27 NOTE — Progress Notes (Signed)
Subjective:    Patient ID: Dominique [name], female    DOB: 09/03/1941, 78 y.o.   MRN: [id number]  DOS:  03/27/2019 Type of visit - description: Virtual Visit via Video Note  I connected with@ on 03/27/19 at  1:00 PM EDT by a video enabled telemedicine application and verified that I am speaking with the correct person using two identifiers.   THIS ENCOUNTER IS A VIRTUAL VISIT DUE TO COVID-19 - PATIENT WAS NOT SEEN IN THE OFFICE. PATIENT HAS CONSENTED TO VIRTUAL VISIT / TELEMEDICINE VISIT   Location of patient: home  Location of provider: office  I discussed the limitations of evaluation and management by telemedicine and the availability of in person appointments. The patient expressed understanding and agreed to proceed.  History of Present Illness: Routine follow-up Since the last office visit, she had a TVAR Chart, recent labs, medication list reviewed. COVID-19: Taking good precautions. Today she has no major concerns   Review of Systems  Denies fever chills No chest pain, difficulty breathing or lower extremity edema Occasionally has allergies, minimal cough.  Past Medical History:  Diagnosis Date  . Emphysema lung ([place])   . Hyperlipidemia    LDL goal < 70  . Hypertension   . NSVT (nonsustained ventricular tachycardia) ([place]) 03/02/2019  . Pulmonary HTN ([place]) 06/05/2016   Moderate with PASP 11mmHg by echo 04/2016 likely Group 3 from COPD and possibly Group 2 from pulmonary venous HTN associated with moderate AS  . Pulmonary nodules    seen on pre TAVR CT, needs 12 month CT follow up  . 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 Sacral fracture ([place])   . Severe aortic stenosis   . Thoracic ascending aortic aneurysm [facility])    needs yearly follow up    Past Surgical History:  Procedure Laterality Date  . APPENDECTOMY  1951  . CATARACT EXTRACTION    . CESAREAN  SECTION     1971  . COLONOSCOPY  2018  . FEMORAL-POPLITEAL BYPASS GRAFT Left 12/31/2018   Procedure: PATCH ANGIOPLASTY REPAIR LEFT FEMORAL ARTERY USING HEMASHIELD PLATINUM FINESSE PATCH;  Surgeon: Rosetta Posner, MD;  Location: [place];  Service: Vascular;  Laterality: Left;  . IR RADIOLOGY PERIPHERAL GUIDED IV START  05/30/2017  . IR RADIOLOGY PERIPHERAL GUIDED IV START  09/02/2018  . IR RADIOLOGY PERIPHERAL GUIDED IV START  09/02/2018  . IR US GUIDE VASC ACCESS LEFT  09/02/2018  . IR US GUIDE VASC ACCESS RIGHT  05/30/2017  . IR US GUIDE VASC ACCESS RIGHT  09/02/2018  . JOINT REPLACEMENT  2016   sacroplasty  . LAMINECTOMY    . RIGHT/LEFT HEART CATH AND CORONARY ANGIOGRAPHY N/A 11/27/2018   Procedure: RIGHT/LEFT HEART CATH AND CORONARY ANGIOGRAPHY;  Surgeon: Sherren Mocha, MD;  Location: [place] CV [facility];  Service: Cardiovascular;  Laterality: N/A;  . TEE WITHOUT CARDIOVERSION N/A 12/30/2018   Procedure: TRANSESOPHAGEAL ECHOCARDIOGRAM (TEE);  Surgeon: Sherren Mocha, MD;  Location: [place] CV LAB;  Service: Open Heart Surgery;  Laterality: N/A;  . TOTAL HIP ARTHROPLASTY Bilateral   . TRANSCATHETER AORTIC VALVE REPLACEMENT, TRANSFEMORAL N/A 12/30/2018   Procedure: TRANSCATHETER AORTIC VALVE REPLACEMENT, TRANSFEMORAL;  Surgeon: Sherren Mocha, MD;  Location: [place] CV LAB;  Service: Open Heart Surgery;  Laterality: N/A;  . TUBAL LIGATION  1975    Social History   Socioeconomic History  . Marital status: Widowed  Spouse name: Not on file  . Number of children: 2  . Years of education: Not on file  . Highest education level: Not on file  Occupational History  . Occupation: n/a  Social Needs  . Financial resource strain: Not on file  . Food insecurity:    Worry: Not on file    Inability: Not on file  . Transportation needs:    Medical: Not on file    Non-medical: Not on file  Tobacco Use  . Smoking status: Former Smoker    Packs/day: 1.00    Years: 40.00    Pack years: 40.00     Types: Cigarettes    Last attempt to quit: 03/19/2004    Years since quitting: 15.0  . Smokeless tobacco: Never Used  Substance and Sexual Activity  . Alcohol use: Yes    Alcohol/week: 3.0 - 4.0 standard drinks    Types: 3 - 4 Glasses of wine per week    Comment: socially  . Drug use: No  . Sexual activity: Not on file  Lifestyle  . Physical activity:    Days per week: Not on file    Minutes per session: Not on file  . Stress: Not on file  Relationships  . Social connections:    Talks on phone: Not on file    Gets together: Not on file    Attends religious service: Not on file    Active member of club or organization: Not on file    Attends meetings of clubs or organizations: Not on file    Relationship status: Not on file  . Intimate partner violence:    Fear of current or ex partner: Not on file    Emotionally abused: Not on file    Physically abused: Not on file    Forced sexual activity: Not on file  Other Topics Concern  . Not on file  Social History Narrative   From [place]   1 living son      Allergies as of 03/27/2019   No Known Allergies     Medication List       Accurate as of Mar 27, 2019 12:59 PM. If you have any questions, ask your nurse or doctor.        albuterol 108 (90 Base) MCG/ACT inhaler Commonly known as:  VENTOLIN HFA Inhale 2 puffs into the lungs every 6 (six) hours as needed for wheezing.   amLODipine 10 MG tablet Commonly known as:  NORVASC Take 1 tablet (10 mg total) by mouth daily.   amoxicillin 500 MG tablet Commonly known as:  AMOXIL Take 4 tablets (2,000mg ) one hour prior to all dental visits.   aspirin EC 81 MG tablet Take 81 mg by mouth daily.   benazepril 20 MG tablet Commonly known as:  LOTENSIN Take 1 tablet (20 mg total) by mouth daily.   CALCIUM 600 + D PO Take 600 mg by mouth 3 (three) times a week.   clopidogrel 75 MG tablet Commonly known as:  PLAVIX Take 1 tablet (75 mg total) by mouth daily with  breakfast.   denosumab 60 MG/ML Soln injection Commonly known as:  PROLIA Inject 60 mg into the skin every 6 (six) months. Administer in upper arm, thigh, or abdomen   EPINEPHrine 0.3 mg/0.3 mL Soaj injection Commonly known as:  EPI-PEN Inject 0.3 mg into the muscle as needed for anaphylaxis.   loratadine 10 MG tablet Commonly known as:  CLARITIN Take 10 mg by mouth daily.  magnesium oxide 400 (241.3 Mg) MG tablet Commonly known as:  MAG-OX Take 1 tablet (400 mg total) by mouth daily.   metoprolol succinate 25 MG 24 hr tablet Commonly known as:  Toprol XL Take 1 tablet (25 mg total) by mouth daily.   multivitamin with minerals Tabs tablet Take 1 tablet by mouth daily.   Nasacort Allergy 24HR 55 MCG/ACT Aero nasal inhaler Generic drug:  triamcinolone Place 1 spray into the nose daily as needed (congestion).   rosuvastatin 40 MG tablet Commonly known as:  CRESTOR TAKE 1 TABLET BY MOUTH EVERY DAY   Vitamin D3 125 MCG (5000 UT) Caps Take 5,000 Units by mouth daily.           Objective:   Physical Exam There were no vitals taken for this visit. This is a virtual video visit. AOx3, no distress noted  BP today 135/81, pulse 76    Assessment      Assessment HTN Hyperlipidemia PULM: --Former smoker, quit 2006 --H/o Emphysema, remote -- CT chest angio  05-2016---> pulm nodule x2,reticular infiltrate ; f/u CT 11/2016 stable nodule, no f/u -- CT chest angio  05-2016--->  enlarged R P.A. and Ao ; hac CT coronary 05/2017, f/u per cards CV: ----H/o murmur (since childhood) ----DOE: [facility] Fling  8-017, CT angio chest 05-2016:  no PE  -----s/p TVAR 12/2018 -[facility] --DJD --Sacral Fx, sacral insufficiency, s/p sacroplasty (radiology) 07-2015 --H/o osteoporosis :  took fosamax while in [place], Fosamax rx again by ortho after sacral Fx 07-2015, never took it, first Prolia 11-14-2015 --T score 08-2015 >> normal (likely normal d/t DJD?) H/o vit D def: Normal level 09-2015    PLAN  HTN: Well-controlled, typically 130/70.  Continue with amlodipine, benazepril, metoprolol.  Last BMP satisfactory Hyperlipidemia: Well-controlled on rosuvastatin Emphysema: Symptoms at baseline.  Uses inhaler as needed Aortic disease: s/p TVAR, doing well. Osteoporosis: Had Prolia 03/26/2019.  Will do a bone density test at the next opportunity COVID-19: Encouraged to continue following all the precautions she is taking. Under typical circumstances I do blood work but we elected not to do to COVID-19. Plan: Return to the office face-to-face in 3 to 4 months.     I discussed the assessment and treatment plan with the patient. The patient was provided an opportunity to ask questions and all were answered. The patient agreed with the plan and demonstrated an understanding of the instructions.   The patient was advised to call back or seek an in-person evaluation if the symptoms worsen or if the condition fails to improve as anticipated.

## 2019-03-28 NOTE — Assessment & Plan Note (Signed)
HTN: Well-controlled, typically 130/70.  Continue with amlodipine, benazepril, metoprolol.  Last BMP satisfactory Hyperlipidemia: Well-controlled on rosuvastatin Emphysema: Symptoms at baseline.  Uses inhaler as needed Aortic disease: s/p TVAR, doing well. Osteoporosis: Had Prolia 03/26/2019.  Will do a bone density test at the next opportunity COVID-19: Encouraged to continue following all the precautions she is taking. Under typical circumstances I do blood work but we elected not to do to COVID-19. Plan: Return to the office face-to-face in 3 to 4 months.

## 2019-03-30 ENCOUNTER — Telehealth: Payer: Self-pay | Admitting: Adult Health

## 2019-03-30 NOTE — Progress Notes (Signed)
Virtual Visit via Video Note   This visit type was conducted due to national recommendations for restrictions regarding the COVID-19 Pandemic (e.g. social distancing) in an effort to limit this patient's exposure and mitigate transmission in our community.  Due to her co-morbid illnesses, this patient is at least at moderate risk for complications without adequate follow up.  This format is felt to be most appropriate for this patient at this time.  All issues noted in this document were discussed and addressed.  A limited physical exam was performed with this format.  Please refer to the patient's chart for her consent to telehealth for [facility].   Date:  04/01/2019   ID:  Dominique [name], DOB 1941/03/07, MRN [id number]  Patient Location: Home Provider Location: Home  PCP:  Colon Branch, MD  Cardiologist:  [name], MD  Electrophysiologist:  None   Evaluation Performed:  Follow-Up Visit  Chief Complaint:  Palpitations  History of Present Illness:    Dominique [name] is a 78 y.o. female we are following for ongoing assessment and management of coronary artery disease, with CTA on 11/2016 revealing coronary calcifications less than 50% plaque in the proximal LAD and mild ascending aortic aneurysm (3.9 cm) hypertension, severe left ear status post TAVR 4/0/0867-[id number] procedure complicated by ischemic left leg, and had emergent repair of left femoral artery with endarterectomy and Dacron patch angioplasty, hyperlipidemia, mild pulmonary hypertension with other history to include COPD.  When seen last by Dr. [name] on 03/02/2019 she continued to have issues with shortness of breath and palpitations which were felt to be related to seasonal allergies and her COPD.  The patient did wear a ambulatory cardiac monitor in April which did reveal a 7-second run of ventricular tachycardia for which she was asymptomatic.  Cardiac catheterization prior to TAVR revealed normal coronary arteries.   She was placed on magnesium 400 mg daily due to hypomagnesemia noted during recent hospitalization.  She was also started on metoprolol succinate 25 mg daily.  She is here for follow-up to evaluate her response to medications.  She states that she is feeling much better with use of the magnesium supplement. She denies any further palpitations. She feels some fullness in her head before taking her medications which subsides when she takes them. She is walking everyday with her dog, and denies any chest pain or dyspnea.   The patient does not have symptoms concerning for COVID-19 infection (fever, chills, cough, or new shortness of breath).    Past Medical History:  Diagnosis Date  . Emphysema lung ([place])   . Hyperlipidemia    LDL goal < 70  . Hypertension   . NSVT (nonsustained ventricular tachycardia) ([place]) 03/02/2019  . Pulmonary HTN ([place]) 06/05/2016   Moderate with PASP 8mmHg by echo 04/2016 likely Group 3 from COPD and possibly Group 2 from pulmonary venous HTN associated with moderate AS  . Pulmonary nodules    seen on pre TAVR CT, needs 12 month CT follow up  . 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 Sacral fracture ([place])   . Severe aortic stenosis   . Thoracic ascending aortic aneurysm [facility])    needs yearly follow up   Past Surgical History:  Procedure Laterality Date  . APPENDECTOMY  1951  . CATARACT EXTRACTION    . CESAREAN SECTION     1971  . COLONOSCOPY  2018  . FEMORAL-POPLITEAL BYPASS GRAFT Left 12/31/2018   Procedure: PATCH ANGIOPLASTY REPAIR LEFT FEMORAL ARTERY USING HEMASHIELD PLATINUM FINESSE PATCH;  Surgeon: Rosetta Posner, MD;  Location: [place];  Service: Vascular;  Laterality: Left;  . IR RADIOLOGY PERIPHERAL GUIDED IV START  05/30/2017  . IR RADIOLOGY PERIPHERAL GUIDED IV START  09/02/2018  . IR RADIOLOGY PERIPHERAL GUIDED IV START  09/02/2018  . IR US GUIDE VASC  ACCESS LEFT  09/02/2018  . IR US GUIDE VASC ACCESS RIGHT  05/30/2017  . IR US GUIDE VASC ACCESS RIGHT  09/02/2018  . JOINT REPLACEMENT  2016   sacroplasty  . LAMINECTOMY    . RIGHT/LEFT HEART CATH AND CORONARY ANGIOGRAPHY N/A 11/27/2018   Procedure: RIGHT/LEFT HEART CATH AND CORONARY ANGIOGRAPHY;  Surgeon: Sherren Mocha, MD;  Location: [place] [facility];  Service: Cardiovascular;  Laterality: N/A;  . TEE WITHOUT CARDIOVERSION N/A 12/30/2018   Procedure: TRANSESOPHAGEAL ECHOCARDIOGRAM (TEE);  Surgeon: Sherren Mocha, MD;  Location: [place] CV LAB;  Service: Open Heart Surgery;  Laterality: N/A;  . TOTAL HIP ARTHROPLASTY Bilateral   . TRANSCATHETER AORTIC VALVE REPLACEMENT, TRANSFEMORAL N/A 12/30/2018   Procedure: TRANSCATHETER AORTIC VALVE REPLACEMENT, TRANSFEMORAL;  Surgeon: Sherren Mocha, MD;  Location: [place] CV LAB;  Service: Open Heart Surgery;  Laterality: N/A;  . TUBAL LIGATION  1975     Current Meds  Medication Sig  . albuterol (PROVENTIL HFA;VENTOLIN HFA) 108 (90 Base) MCG/ACT inhaler Inhale 2 puffs into the lungs every 6 (six) hours as needed for wheezing.  Marland Kitchen amLODipine (NORVASC) 10 MG tablet Take 1 tablet (10 mg total) by mouth daily.  Marland Kitchen aspirin EC 81 MG tablet Take 81 mg by mouth daily.  . benazepril (LOTENSIN) 20 MG tablet Take 1 tablet (20 mg total) by mouth daily.  . Calcium Carb-Cholecalciferol (CALCIUM 600 + D PO) Take 600 mg by mouth 3 (three) times a week.   . Cholecalciferol (VITAMIN D3) 5000 UNITS CAPS Take 5,000 Units by mouth daily.   . clopidogrel (PLAVIX) 75 MG tablet Take 1 tablet (75 mg total) by mouth daily with breakfast.  . denosumab (PROLIA) 60 MG/ML SOLN injection Inject 60 mg into the skin every 6 (six) months. Administer in upper arm, thigh, or abdomen  . EPINEPHrine 0.3 mg/0.3 mL IJ SOAJ injection Inject 0.3 mg into the muscle as needed for anaphylaxis.   Marland Kitchen loratadine (CLARITIN) 10 MG tablet Take 10 mg by mouth daily.  . magnesium oxide (MAG-OX) 400  (241.3 Mg) MG tablet Take 1 tablet (400 mg total) by mouth daily.  . metoprolol succinate (TOPROL XL) 25 MG 24 hr tablet Take 1 tablet (25 mg total) by mouth daily.  . Multiple Vitamin (MULTIVITAMIN WITH MINERALS) TABS tablet Take 1 tablet by mouth daily.  . rosuvastatin (CRESTOR) 40 MG tablet TAKE 1 TABLET BY MOUTH EVERY DAY  . triamcinolone (NASACORT ALLERGY 24HR) 55 MCG/ACT AERO nasal inhaler Place 1 spray into the nose daily as needed (congestion).     Allergies:   Patient has no known allergies.   Social History   Tobacco Use  . Smoking status: Former Smoker    Packs/day: 1.00    Years: 40.00    Pack years: 40.00    Types: Cigarettes    Last attempt to quit: 03/19/2004    Years since quitting: 15.0  . Smokeless tobacco: Never Used  Substance Use Topics  . Alcohol use: Yes    Alcohol/week: 3.0 - 4.0 standard drinks    Types:  3 - 4 Glasses of wine per week    Comment: socially  . Drug use: No     Family Hx: The patient's family history includes Breast cancer in her mother; Cancer in her brother; Diabetes in her son. There is no history of Colon cancer, CAD, Colon polyps, Rectal cancer, or Stomach cancer.  ROS:   Please see the history of present illness.    All other systems reviewed and are negative.   Prior CV studies:   The following studies were reviewed today: Echo 12/31/18 IMPRESSIONS 1. The left ventricle has hyperdynamic systolic function, with an ejection fraction of >65%. The cavity size was normal. There is moderately increased left ventricular wall thickness. Left ventricular diastolic Doppler parameters are consistent with  impaired relaxation Indeterminent filling pressures No evidence of left ventricular regional wall motion abnormalities. 2. The right ventricle has normal systolic function. The cavity was normal. There is no increase in right ventricular wall thickness. 3. Left atrial size was mildly dilated. 4. The mitral valve is degenerative. Mild  calcification of the mitral valve leaflet. 5. The aortic root and ascending aorta are normal in size and structure. 6. The inferior vena cava was dilated in size with <50% respiratory variability. 7. No evidence of left ventricular regional wall motion abnormalities. 8. When compared to the prior study: 10/30/2018 - 60-65%, severe AS.  Aortic Valve: The aortic valve has been repaired/replaced Aortic valve regurgitation was not visualized by color flow Doppler. There is no evidence of aortic valve stenosis  Echo 01/26/19 IMPRESSIONS 1. The left ventricle has normal systolic function with an ejection fraction of 60-65%. The cavity size was normal. Left ventricular diastolic parameters were normal. 2. The right ventricle has normal systolic function. The cavity was normal. There is no increase in right ventricular wall thickness. 3. No evidence present in the left atrial appendage. 4. Mild thickening of the mitral valve leaflet. 5. Post TAVR with 26 mm Medtronic Core Valve Mean gradient low and stable since echo done 12/31/18 and no significant PVL. 6. No pulmonic valve vegetation visualized.  Cardiac Cath 11/27/2018 1. Angiographically normal coronary arteries (left dominant) 2. Calcified, restricted aortic valve by plain fluoroscopy with hemodynamic findings consistent with moderate aortic stenosis (mean gradient 17 mmHg, AVA 1.47 square cm) 3. Normal right heart pressures  The patient has progressive dyspnea, NYHA III sx's, with echo findings consistent with severe AS. Will proceed with multidisciplinary evaluation of aortic stenosis.  Labs/Other Tests and Data Reviewed:    EKG:  No ECG reviewed.  Recent Labs: 05/14/2018: TSH 1.12 12/26/2018: ALT 20; B Natriuretic Peptide 65.3 12/31/2018: Magnesium 1.6 01/01/2019: BUN 11; Creatinine, Ser 0.64; Hemoglobin 10.5; Platelets 202; Potassium 4.1; Sodium 139   Recent Lipid Panel Lab Results  Component Value Date/Time   CHOL 156  03/10/2018 11:08 AM   TRIG 61 03/10/2018 11:08 AM   HDL 71 03/10/2018 11:08 AM   CHOLHDL 2.2 03/10/2018 11:08 AM   CHOLHDL 2 11/13/2017 11:27 AM   LDLCALC 73 03/10/2018 11:08 AM    Wt Readings from Last 3 Encounters:  04/01/19 157 lb (71.2 kg)  02/19/19 155 lb (70.3 kg)  01/26/19 157 lb 9.6 oz (71.5 kg)     Objective:    Vital Signs:  BP 110/69   Pulse 75   Ht 5\' 6"  (1.676 m)   Wt 157 lb (71.2 kg)   BMI 25.34 kg/m    AAO No acute distress  Respirations are regular no audible wheezing  No excessive  bruising on skin via a phone view Good affect   ASSESSMENT & PLAN:    1. Palpitations: Much better after beginning magnesium, started last visit with Dr. [name] due to hypomagnesemia.   2. CAD: CTA 11/2016 revealing < 50% plaque in the LAD. She will continue secondary prevention with low cholesterol diet and BP control.   3. S/P TAVR: She will have follow up echo in one year. She will continue on SBE prophylaxis. No symptoms at this time.   4. Ischemic Left leg: S/P repair of left femoral artery with endarterectomy and Dacron patch. She offers no complaints of pain in her legs, and walks everyday.   COVID-19 Education: The signs and symptoms of COVID-19 were discussed with the patient and how to seek care for testing (follow up with PCP or arrange E-visit).  The importance of social distancing was discussed today.  Time:   Today, I have spent 15 minutes with the patient with telehealth technology discussing the above problems.     Medication Adjustments/Labs and Tests Ordered: Current medicines are reviewed at length with the patient today.  Concerns regarding medicines are outlined above.   Tests Ordered: No orders of the defined types were placed in this encounter.   Medication Changes: No orders of the defined types were placed in this encounter.   Disposition:  Follow up 6 months with Dr.W[place]ster   Signed, Phill Myron. [place] Pugh, ANP, AACC  04/01/2019 11:08  AM    [facility]

## 2019-03-30 NOTE — Telephone Encounter (Signed)
iphone/ consent/ my chart/ pre reg completed °

## 2019-04-01 ENCOUNTER — Encounter: Payer: Self-pay | Admitting: Adult Health

## 2019-04-01 ENCOUNTER — Telehealth (INDEPENDENT_AMBULATORY_CARE_PROVIDER_SITE_OTHER): Payer: Medicare Other | Admitting: Adult Health

## 2019-04-01 VITALS — BP 110/69 | HR 75 | Ht 66.0 in | Wt 157.0 lb

## 2019-04-01 DIAGNOSIS — E7800 Pure hypercholesterolemia, unspecified: Secondary | ICD-10-CM

## 2019-04-01 DIAGNOSIS — I10 Essential (primary) hypertension: Secondary | ICD-10-CM

## 2019-04-01 DIAGNOSIS — I2510 Atherosclerotic heart disease of native coronary artery without angina pectoris: Secondary | ICD-10-CM | POA: Diagnosis not present

## 2019-04-01 DIAGNOSIS — Z952 Presence of prosthetic heart valve: Secondary | ICD-10-CM

## 2019-04-01 NOTE — Patient Instructions (Signed)
Follow-Up: You will need a follow up appointment in 6 months.  Please call our office 2 months in advance to schedule this appointment.  You may see [name], MD, Jory Sims, DNP, AACC  or one of the following Advanced Practice Providers on your designated Care Team:   Kerin Ransom, [place]  Roby Lofts, PA-C  Sande Rives, [place]       Medication Instructions:  NO CHANGES- Your physician recommends that you continue on your current medications as directed. Please refer to the Current Medication list given to you today. If you need a refill on your cardiac medications before your next appointment, please call your pharmacy.  Labwork: When you have labs (blood work) and your tests are completely normal, you will receive your results ONLY by [place] (if you have MyChart) -OR- A paper copy in the mail.  At [facility], you and your health needs are our priority.  As part of our continuing mission to provide you with exceptional heart care, we have created designated Provider Care Teams.  These Care Teams include your primary Cardiologist (physician) and Advanced Practice Providers (APPs -  Physician Assistants and Nurse Practitioners) who all work together to provide you with the care you need, when you need it.  Thank you for choosing [facility] at [facility]!!

## 2019-04-02 ENCOUNTER — Telehealth: Payer: Medicare Other | Admitting: Adult Health

## 2019-04-14 ENCOUNTER — Telehealth (HOSPITAL_COMMUNITY): Payer: Self-pay | Admitting: *Deleted

## 2019-04-26 ENCOUNTER — Emergency Department (HOSPITAL_BASED_OUTPATIENT_CLINIC_OR_DEPARTMENT_OTHER): Payer: Medicare Other

## 2019-04-26 ENCOUNTER — Encounter (HOSPITAL_BASED_OUTPATIENT_CLINIC_OR_DEPARTMENT_OTHER): Payer: Self-pay | Admitting: Emergency Medicine

## 2019-04-26 ENCOUNTER — Other Ambulatory Visit: Payer: Self-pay

## 2019-04-26 ENCOUNTER — Inpatient Hospital Stay (HOSPITAL_BASED_OUTPATIENT_CLINIC_OR_DEPARTMENT_OTHER)
Admission: EM | Admit: 2019-04-26 | Discharge: 2019-04-29 | DRG: 418 | Disposition: A | Discharge status: Home / Self Care | Payer: Medicare Other | Attending: Internal Medicine | Admitting: Internal Medicine

## 2019-04-26 DIAGNOSIS — Z03818 Encounter for observation for suspected exposure to other biological agents ruled out: Secondary | ICD-10-CM | POA: Diagnosis not present

## 2019-04-26 DIAGNOSIS — Z7982 Long term (current) use of aspirin: Secondary | ICD-10-CM | POA: Diagnosis not present

## 2019-04-26 DIAGNOSIS — I350 Nonrheumatic aortic (valve) stenosis: Secondary | ICD-10-CM | POA: Diagnosis present

## 2019-04-26 DIAGNOSIS — I959 Hypotension, unspecified: Secondary | ICD-10-CM | POA: Diagnosis not present

## 2019-04-26 DIAGNOSIS — Z792 Long term (current) use of antibiotics: Secondary | ICD-10-CM | POA: Diagnosis not present

## 2019-04-26 DIAGNOSIS — Z833 Family history of diabetes mellitus: Secondary | ICD-10-CM | POA: Diagnosis not present

## 2019-04-26 DIAGNOSIS — I2510 Atherosclerotic heart disease of native coronary artery without angina pectoris: Secondary | ICD-10-CM | POA: Diagnosis present

## 2019-04-26 DIAGNOSIS — K8020 Calculus of gallbladder without cholecystitis without obstruction: Secondary | ICD-10-CM | POA: Diagnosis not present

## 2019-04-26 DIAGNOSIS — I480 Paroxysmal atrial fibrillation: Secondary | ICD-10-CM | POA: Diagnosis not present

## 2019-04-26 DIAGNOSIS — J439 Emphysema, unspecified: Secondary | ICD-10-CM | POA: Diagnosis not present

## 2019-04-26 DIAGNOSIS — K8000 Calculus of gallbladder with acute cholecystitis without obstruction: Secondary | ICD-10-CM | POA: Diagnosis not present

## 2019-04-26 DIAGNOSIS — I10 Essential (primary) hypertension: Secondary | ICD-10-CM | POA: Diagnosis present

## 2019-04-26 DIAGNOSIS — Z7902 Long term (current) use of antithrombotics/antiplatelets: Secondary | ICD-10-CM

## 2019-04-26 DIAGNOSIS — J441 Chronic obstructive pulmonary disease with (acute) exacerbation: Secondary | ICD-10-CM | POA: Diagnosis present

## 2019-04-26 DIAGNOSIS — I4729 Other ventricular tachycardia: Secondary | ICD-10-CM

## 2019-04-26 DIAGNOSIS — Z953 Presence of xenogenic heart valve: Secondary | ICD-10-CM

## 2019-04-26 DIAGNOSIS — Z96643 Presence of artificial hip joint, bilateral: Secondary | ICD-10-CM | POA: Diagnosis present

## 2019-04-26 DIAGNOSIS — Z803 Family history of malignant neoplasm of breast: Secondary | ICD-10-CM

## 2019-04-26 DIAGNOSIS — I472 Ventricular tachycardia: Secondary | ICD-10-CM | POA: Diagnosis not present

## 2019-04-26 DIAGNOSIS — K810 Acute cholecystitis: Secondary | ICD-10-CM | POA: Diagnosis not present

## 2019-04-26 DIAGNOSIS — E7800 Pure hypercholesterolemia, unspecified: Secondary | ICD-10-CM | POA: Diagnosis not present

## 2019-04-26 DIAGNOSIS — J449 Chronic obstructive pulmonary disease, unspecified: Secondary | ICD-10-CM | POA: Diagnosis present

## 2019-04-26 DIAGNOSIS — R1011 Right upper quadrant pain: Secondary | ICD-10-CM | POA: Diagnosis not present

## 2019-04-26 DIAGNOSIS — I4891 Unspecified atrial fibrillation: Secondary | ICD-10-CM | POA: Diagnosis not present

## 2019-04-26 DIAGNOSIS — Z87891 Personal history of nicotine dependence: Secondary | ICD-10-CM

## 2019-04-26 DIAGNOSIS — Z01810 Encounter for preprocedural cardiovascular examination: Secondary | ICD-10-CM | POA: Diagnosis not present

## 2019-04-26 DIAGNOSIS — E860 Dehydration: Secondary | ICD-10-CM | POA: Diagnosis not present

## 2019-04-26 DIAGNOSIS — Z79899 Other long term (current) drug therapy: Secondary | ICD-10-CM | POA: Diagnosis not present

## 2019-04-26 DIAGNOSIS — E785 Hyperlipidemia, unspecified: Secondary | ICD-10-CM | POA: Diagnosis not present

## 2019-04-26 DIAGNOSIS — D638 Anemia in other chronic diseases classified elsewhere: Secondary | ICD-10-CM | POA: Diagnosis not present

## 2019-04-26 DIAGNOSIS — Z1159 Encounter for screening for other viral diseases: Secondary | ICD-10-CM | POA: Diagnosis not present

## 2019-04-26 DIAGNOSIS — R0602 Shortness of breath: Secondary | ICD-10-CM | POA: Diagnosis not present

## 2019-04-26 DIAGNOSIS — K819 Cholecystitis, unspecified: Secondary | ICD-10-CM | POA: Diagnosis not present

## 2019-04-26 HISTORY — DX: Chronic obstructive pulmonary disease, unspecified: J44.9

## 2019-04-26 HISTORY — DX: Hypomagnesemia: E83.42

## 2019-04-26 HISTORY — DX: Unspecified atrial fibrillation: I48.91

## 2019-04-26 HISTORY — DX: Atherosclerosis of native arteries of extremities with rest pain, unspecified extremity: I70.229

## 2019-04-26 LAB — CBC WITH DIFFERENTIAL/PLATELET
Abs Immature Granulocytes: 0.13 10*3/uL — ABNORMAL HIGH (ref 0.00–0.07)
Basophils Absolute: 0.1 10*3/uL (ref 0.0–0.1)
Basophils Relative: 0 %
Eosinophils Absolute: 0 10*3/uL (ref 0.0–0.5)
Eosinophils Relative: 0 %
HCT: 42.9 % (ref 36.0–46.0)
Hemoglobin: 13.8 g/dL (ref 12.0–15.0)
Immature Granulocytes: 1 %
Lymphocytes Relative: 4 %
Lymphs Abs: 0.9 10*3/uL (ref 0.7–4.0)
MCH: 29.6 pg (ref 26.0–34.0)
MCHC: 32.2 g/dL (ref 30.0–36.0)
MCV: 91.9 fL (ref 80.0–100.0)
Monocytes Absolute: 1.3 10*3/uL — ABNORMAL HIGH (ref 0.1–1.0)
Monocytes Relative: 6 %
Neutro Abs: 18.2 10*3/uL — ABNORMAL HIGH (ref 1.7–7.7)
Neutrophils Relative %: 89 %
Platelets: 295 10*3/uL (ref 150–400)
RBC: 4.67 MIL/uL (ref 3.87–5.11)
RDW: 13.5 % (ref 11.5–15.5)
WBC: 20.6 10*3/uL — ABNORMAL HIGH (ref 4.0–10.5)
nRBC: 0 % (ref 0.0–0.2)

## 2019-04-26 LAB — URINALYSIS, ROUTINE W REFLEX MICROSCOPIC
Bilirubin Urine: NEGATIVE
Glucose, UA: NEGATIVE mg/dL
Hgb urine dipstick: NEGATIVE
Ketones, ur: NEGATIVE mg/dL
Leukocytes,Ua: NEGATIVE
Nitrite: NEGATIVE
Protein, ur: NEGATIVE mg/dL
Specific Gravity, Urine: 1.01 (ref 1.005–1.030)
pH: 6.5 (ref 5.0–8.0)

## 2019-04-26 LAB — COMPREHENSIVE METABOLIC PANEL
ALT: 25 U/L (ref 0–44)
AST: 36 U/L (ref 15–41)
Albumin: 4.1 g/dL (ref 3.5–5.0)
Alkaline Phosphatase: 60 U/L (ref 38–126)
Anion gap: 11 (ref 5–15)
BUN: 17 mg/dL (ref 8–23)
CO2: 25 mmol/L (ref 22–32)
Calcium: 10.2 mg/dL (ref 8.9–10.3)
Chloride: 102 mmol/L (ref 98–111)
Creatinine, Ser: 0.7 mg/dL (ref 0.44–1.00)
GFR calc Af Amer: >60 mL/min (ref >60)
GFR calc non Af Amer: >60 mL/min (ref >60)
Glucose, Bld: 157 mg/dL — ABNORMAL HIGH (ref 70–99)
Potassium: 3.8 mmol/L (ref 3.5–5.1)
Sodium: 138 mmol/L (ref 135–145)
Total Bilirubin: 0.5 mg/dL (ref 0.3–1.2)
Total Protein: 7.3 g/dL (ref 6.5–8.1)

## 2019-04-26 LAB — TROPONIN I (HIGH SENSITIVITY)
Troponin I (High Sensitivity): 8 ng/L (ref <18)
Troponin I (High Sensitivity): 7 ng/L (ref <18)

## 2019-04-26 LAB — SARS CORONAVIRUS 2 AG (30 MIN TAT): SARS Coronavirus 2 Ag: NEGATIVE

## 2019-04-26 LAB — LIPASE, BLOOD: Lipase: 28 U/L (ref 11–51)

## 2019-04-26 MED ORDER — METOPROLOL SUCCINATE ER 25 MG PO TB24: 25.0000 mg | ORAL_TABLET | Freq: Every day | ORAL | Status: DC

## 2019-04-26 MED ORDER — HYDROMORPHONE HCL 1 MG/ML IJ SOLN: 0.5000 mg | INTRAMUSCULAR | Status: DC | PRN

## 2019-04-26 MED ORDER — KETOROLAC TROMETHAMINE 15 MG/ML IJ SOLN
15.0000 mg | Freq: Four times a day (QID) | INTRAMUSCULAR | Status: DC | PRN
  Administered 2019-04-27 – 2019-04-28 (×2): 15 mg via INTRAVENOUS
  Filled 2019-04-26 (×2): qty 1

## 2019-04-26 MED ORDER — SODIUM CHLORIDE 0.9 % IV SOLN
INTRAVENOUS | Status: DC
  Administered 2019-04-26 – 2019-04-28 (×3): via INTRAVENOUS

## 2019-04-26 MED ORDER — ACETAMINOPHEN 325 MG PO TABS
650.0000 mg | ORAL_TABLET | Freq: Four times a day (QID) | ORAL | Status: DC | PRN
  Administered 2019-04-27 – 2019-04-28 (×3): 650 mg via ORAL
  Filled 2019-04-26 (×3): qty 2

## 2019-04-26 MED ORDER — PIPERACILLIN-TAZOBACTAM 3.375 G IVPB
3.3750 g | Freq: Three times a day (TID) | INTRAVENOUS | Status: DC
  Administered 2019-04-26 – 2019-04-28 (×5): 3.375 g via INTRAVENOUS
  Filled 2019-04-26 (×5): qty 50

## 2019-04-26 MED ORDER — ONDANSETRON HCL 4 MG/2ML IJ SOLN: 4.0000 mg | Freq: Four times a day (QID) | INTRAMUSCULAR | Status: DC | PRN

## 2019-04-26 MED ORDER — LORATADINE 10 MG PO TABS: 10.0000 mg | ORAL_TABLET | Freq: Every day | ORAL | Status: DC

## 2019-04-26 MED ORDER — IOHEXOL 300 MG/ML  SOLN
100.0000 mL | Freq: Once | INTRAMUSCULAR | Status: AC | PRN
  Administered 2019-04-26: 100 mL via INTRAVENOUS

## 2019-04-26 MED ORDER — DIPHENHYDRAMINE HCL 25 MG PO CAPS
25.0000 mg | ORAL_CAPSULE | Freq: Every evening | ORAL | Status: DC | PRN
  Administered 2019-04-26: 25 mg via ORAL
  Filled 2019-04-26: qty 1

## 2019-04-26 MED ORDER — SODIUM CHLORIDE 0.9 % IV BOLUS
500.0000 mL | Freq: Once | INTRAVENOUS | Status: AC
  Administered 2019-04-26: 500 mL via INTRAVENOUS

## 2019-04-26 MED ORDER — ONDANSETRON HCL 4 MG/2ML IJ SOLN
4.0000 mg | Freq: Once | INTRAMUSCULAR | Status: AC
  Administered 2019-04-26: 4 mg via INTRAVENOUS
  Filled 2019-04-26: qty 2

## 2019-04-26 MED ORDER — ALBUTEROL SULFATE (2.5 MG/3ML) 0.083% IN NEBU: 2.5000 mg | INHALATION_SOLUTION | Freq: Four times a day (QID) | RESPIRATORY_TRACT | Status: DC | PRN

## 2019-04-26 MED ORDER — AMLODIPINE BESYLATE 10 MG PO TABS
10.0000 mg | ORAL_TABLET | Freq: Every day | ORAL | Status: DC
  Administered 2019-04-26: 10 mg via ORAL
  Filled 2019-04-26: qty 1

## 2019-04-26 MED ORDER — PIPERACILLIN-TAZOBACTAM 3.375 G IVPB
3.3750 g | Freq: Once | INTRAVENOUS | Status: AC
  Administered 2019-04-26: 3.375 g via INTRAVENOUS
  Filled 2019-04-26: qty 50

## 2019-04-26 MED ORDER — ACETAMINOPHEN 650 MG RE SUPP: 650.0000 mg | Freq: Four times a day (QID) | RECTAL | Status: DC | PRN

## 2019-04-26 MED ORDER — TRIAMCINOLONE ACETONIDE 55 MCG/ACT NA AERO: 1.0000 | INHALATION_SPRAY | Freq: Every day | NASAL | Status: DC | PRN

## 2019-04-26 MED ORDER — BENAZEPRIL HCL 10 MG PO TABS
20.0000 mg | ORAL_TABLET | Freq: Every day | ORAL | Status: DC
  Administered 2019-04-26: 20 mg via ORAL
  Filled 2019-04-26: qty 2

## 2019-04-26 MED ORDER — ONDANSETRON HCL 4 MG PO TABS: 4.0000 mg | ORAL_TABLET | Freq: Four times a day (QID) | ORAL | Status: DC | PRN

## 2019-04-26 MED ORDER — PIPERACILLIN-TAZOBACTAM 3.375 G IVPB 30 MIN: 3.3750 g | Freq: Four times a day (QID) | INTRAVENOUS | Status: DC

## 2019-04-26 MED ORDER — TRAZODONE HCL 50 MG PO TABS: 25.0000 mg | ORAL_TABLET | Freq: Every evening | ORAL | Status: DC | PRN

## 2019-04-26 NOTE — Consult Note (Signed)
Surgical Consultation Requesting provider: Dr. Wyonia Hough  CC: abdominal pain, emesis  HPI: This is a pleasant 78 year old woman with a history of severe aortic stenosis status post TAVR about 4 months ago, hypertension, hyperlipidemia, COPD and apparently new onset atrial fibrillation while in the ER today, who presented to [name] with abdominal pain and vomiting which had begun suddenly around 8:30 PM last night.  She reports prior to this, she had had just a piece of bread for dinner and a small piece of cake in the afternoon but really had not eaten much.  She states that the pain was worst in the right upper abdomen but really she had pain throughout the entire abdomen.  This was associated with refractory nausea and emesis which carried on until about 3:00 in the morning.  At this point the nausea was better and the pain was manageable, but it was still sufficient to keep her awake.  She has never had any prior similar symptoms.  She had her daughter bring her to the emergency room where she underwent CT as well as ultrasound which are concerning for acute cholecystitis.  Noted to have a white count of 20.  Last dose of Plavix reported on June 27.  Surgical history notable for C-section via low midline, open appendectomy, and left groin access for TAVR  No Known Allergies  Past Medical History:  Diagnosis Date  . Emphysema lung ([place])   . Hyperlipidemia    LDL goal < 70  . Hypertension   . NSVT (nonsustained ventricular tachycardia) ([place]) 03/02/2019  . Pulmonary HTN ([place]) 06/05/2016   Moderate with PASP 21mmHg by echo 04/2016 likely Group 3 from COPD and possibly Group 2 from pulmonary venous HTN associated with moderate AS  . Pulmonary nodules    seen on pre TAVR CT, needs 12 month CT follow up  . 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 Sacral fracture ([place])   .  Severe aortic stenosis   . Thoracic ascending aortic aneurysm [facility]    needs yearly follow up   Patient Active Problem List   Diagnosis Date Noted  . Acute cholecystitis 04/26/2019  . NSVT (nonsustained ventricular tachycardia) ([place]) 03/02/2019  . Ischemic leg 01/01/2019  . Thoracic ascending aortic aneurysm ([place]) 12/30/2018  . S/P TAVR (transcatheter aortic valve replacement) 12/30/2018  . CAD (coronary artery disease), native coronary artery   . Multiple lung nodules 06/15/2016  . Severe aortic stenosis   . PCP NOTES >>>>>>> 10/15/2015  . Vitamin D deficiency 10/14/2015  . Sacral fracture ([place])   . Sciatica 06/08/2015  . History of colonic polyps 03/30/2015  . Hypocalcemia 12/27/2014  . Essential hypertension 03/19/2014  . Hyperlipidemia 03/19/2014  . COPD GOLD II 03/19/2014     Past Surgical History:  Procedure Laterality Date  . APPENDECTOMY  1951  . CATARACT EXTRACTION    . CESAREAN SECTION     1971  . COLONOSCOPY  2018  . FEMORAL-POPLITEAL BYPASS GRAFT Left 12/31/2018   Procedure: PATCH ANGIOPLASTY REPAIR LEFT FEMORAL ARTERY USING HEMASHIELD PLATINUM FINESSE PATCH;  Surgeon: Rosetta Posner, MD;  Location: [facility];  Service: Vascular;  Laterality: Left;  . IR RADIOLOGY PERIPHERAL GUIDED IV START  05/30/2017  . IR RADIOLOGY PERIPHERAL GUIDED IV START  09/02/2018  . IR RADIOLOGY PERIPHERAL GUIDED IV START  09/02/2018  . IR US GUIDE VASC ACCESS LEFT  09/02/2018  .  IR US GUIDE VASC ACCESS RIGHT  05/30/2017  . IR US GUIDE VASC ACCESS RIGHT  09/02/2018  . JOINT REPLACEMENT  2016   sacroplasty  . LAMINECTOMY    . RIGHT/LEFT HEART CATH AND CORONARY ANGIOGRAPHY N/A 11/27/2018   Procedure: RIGHT/LEFT HEART CATH AND CORONARY ANGIOGRAPHY;  Surgeon: Sherren Mocha, MD;  Location: [facility];  Service: Cardiovascular;  Laterality: N/A;  . TEE WITHOUT CARDIOVERSION N/A 12/30/2018   Procedure: TRANSESOPHAGEAL ECHOCARDIOGRAM (TEE);  Surgeon: Sherren Mocha, MD;  Location: [facility];  Service: Open Heart Surgery;  Laterality: N/A;  . TOTAL HIP ARTHROPLASTY Bilateral   . TRANSCATHETER AORTIC VALVE REPLACEMENT, TRANSFEMORAL N/A 12/30/2018   Procedure: TRANSCATHETER AORTIC VALVE REPLACEMENT, TRANSFEMORAL;  Surgeon: Sherren Mocha, MD;  Location: [facility];  Service: Open Heart Surgery;  Laterality: N/A;  . TUBAL LIGATION  1975    Family History  Problem Relation Age of Onset  . Breast cancer Mother        breast  . Diabetes Son        type 1  . Cancer Brother        lung  . Colon cancer Neg Hx   . CAD Neg Hx   . Colon polyps Neg Hx   . Rectal cancer Neg Hx   . Stomach cancer Neg Hx     Social History   Socioeconomic History  . Marital status: Widowed    Spouse name: Not on file  . Number of children: 2  . Years of education: Not on file  . Highest education level: Not on file  Occupational History  . Occupation: n/a  Social Needs  . Financial resource strain: Not on file  . Food insecurity    Worry: Not on file    Inability: Not on file  . Transportation needs    Medical: Not on file    Non-medical: Not on file  Tobacco Use  . Smoking status: Former Smoker    Packs/day: 1.00    Years: 40.00    Pack years: 40.00    Types: Cigarettes    Quit date: 03/19/2004    Years since quitting: 15.1  . Smokeless tobacco: Never Used  Substance and Sexual Activity  . Alcohol use: Yes    Alcohol/week: 3.0 - 4.0 standard drinks    Types: 3 - 4 Glasses of wine per week    Comment: socially  . Drug use: No  . Sexual activity: Not on file  Lifestyle  . Physical activity    Days per week: Not on file    Minutes per session: Not on file  . Stress: Not on file  Relationships  . Social Herbalist on phone: Not on file    Gets together: Not on file    Attends religious service: Not on file    Active member of club or organization: Not on file    Attends meetings of clubs or organizations: Not on file    Relationship status: Not on file   Other Topics Concern  . Not on file  Social History Narrative   From [place]   1 living son    No current facility-administered medications on file prior to encounter.    Current Outpatient Medications on File Prior to Encounter  Medication Sig Dispense Refill  . albuterol (PROVENTIL HFA;VENTOLIN HFA) 108 (90 Base) MCG/ACT inhaler Inhale 2 puffs into the lungs every 6 (six) hours as needed for wheezing.    Marland Kitchen  amLODipine (NORVASC) 10 MG tablet Take 1 tablet (10 mg total) by mouth daily. 180 tablet 3  . aspirin EC 81 MG tablet Take 81 mg by mouth daily.    . benazepril (LOTENSIN) 20 MG tablet Take 1 tablet (20 mg total) by mouth daily. 90 tablet 2  . Calcium Carb-Cholecalciferol (CALCIUM 600 + D PO) Take 600 mg by mouth 3 (three) times a week.     . Cholecalciferol (VITAMIN D3) 5000 UNITS CAPS Take 5,000 Units by mouth daily.     . clopidogrel (PLAVIX) 75 MG tablet Take 1 tablet (75 mg total) by mouth daily with breakfast. 90 tablet 1  . denosumab (PROLIA) 60 MG/ML SOLN injection Inject 60 mg into the skin every 6 (six) months. Administer in upper arm, thigh, or abdomen    . EPINEPHrine 0.3 mg/0.3 mL IJ SOAJ injection Inject 0.3 mg into the muscle as needed for anaphylaxis.   12  . loratadine (CLARITIN) 10 MG tablet Take 10 mg by mouth daily as needed for allergies.     . magnesium oxide (MAG-OX) 400 (241.3 Mg) MG tablet Take 1 tablet (400 mg total) by mouth daily. 90 tablet 1  . Multiple Vitamin (MULTIVITAMIN WITH MINERALS) TABS tablet Take 1 tablet by mouth daily.    . rosuvastatin (CRESTOR) 40 MG tablet TAKE 1 TABLET BY MOUTH EVERY DAY 90 tablet 3  . metoprolol succinate (TOPROL XL) 25 MG 24 hr tablet Take 1 tablet (25 mg total) by mouth daily. (Patient not taking: Reported on 04/26/2019) 90 tablet 1    Review of Systems: a complete, 10pt review of systems was completed with pertinent positives and negatives as documented in the HPI  Physical Exam: Vitals:   04/26/19 1547 04/26/19  1634  BP: (!) 120/51 110/74  Pulse: 80 74  Resp: 20 18  Temp:  99 F (37.2 C)  SpO2: 100% 99%   Gen: A&Ox3, no distress  Head: normocephalic, atraumatic Eyes: extraocular motions intact, anicteric.  Neck: supple without mass or thyromegaly Chest: unlabored respirations, symmetrical air entry Cardiovascular: RRR with palpable distal pulses, no pedal edema Abdomen: soft, nondistended, tender in epigastrium and right hemiabdomen with exquisite tenderness in the right subcostal region. No mass or organomegaly.  Well-healed low midline and appendectomy scars. Extremities: warm, without edema, no deformities  Neuro: grossly intact Psych: appropriate mood and affect, normal insight  Skin: warm and dry   CBC Latest Ref Rng & Units 04/26/2019 01/01/2019 12/31/2018  WBC 4.0 - 10.5 K/uL 20.6(H) 14.8(H) 14.2(H)  Hemoglobin 12.0 - 15.0 g/dL 13.8 10.5(L) 11.1(L)  Hematocrit 36.0 - 46.0 % 42.9 32.1(L) 35.0(L)  Platelets 150 - 400 K/uL 295 202 237    CMP Latest Ref Rng & Units 04/26/2019 01/01/2019 12/31/2018  Glucose 70 - 99 mg/dL 157(H) 127(H) 121(H)  BUN 8 - 23 mg/dL 17 11 12   Creatinine 0.44 - 1.00 mg/dL 0.70 0.64 0.68  Sodium 135 - 145 mmol/L 138 139 139  Potassium 3.5 - 5.1 mmol/L 3.8 4.1 3.8  Chloride 98 - 111 mmol/L 102 106 110  CO2 22 - 32 mmol/L 25 25 23   Calcium 8.9 - 10.3 mg/dL 10.2 8.2(L) 8.4(L)  Total Protein 6.5 - 8.1 g/dL 7.3 - -  Total Bilirubin 0.3 - 1.2 mg/dL 0.5 - -  Alkaline Phos 38 - 126 U/L 60 - -  AST 15 - 41 U/L 36 - -  ALT 0 - 44 U/L 25 - -    Lab Results  Component Value Date  INR 1.0 12/26/2018   INR 1.03 08/11/2015    Imaging: Dg Chest 2 View  Result Date: 04/26/2019 CLINICAL DATA:  Shortness of breath EXAM: CHEST - 2 VIEW COMPARISON:  December 30, 2018 FINDINGS: There is no appreciable edema or consolidation. Heart size and pulmonary vascularity are normal. There is an aortic valve replacement. No adenopathy. No bone lesions. No pneumothorax. IMPRESSION: Status  post aortic valve replacement. No edema or consolidation. Heart size normal. Electronically Signed   By: Lowella Grip III M.D.   On: 04/26/2019 11:35   Ct Abdomen Pelvis W Contrast  Result Date: 04/26/2019 CLINICAL DATA:  Right upper quadrant pain with nausea and vomiting beginning last night EXAM: CT ABDOMEN AND PELVIS WITH CONTRAST TECHNIQUE: Multidetector CT imaging of the abdomen and pelvis was performed using the standard protocol following bolus administration of intravenous contrast. CONTRAST:  175mL OMNIPAQUE IOHEXOL 300 MG/ML  SOLN COMPARISON:  12/09/2018 FINDINGS: Lower chest: Transcatheter aortic valve replacement. No acute finding Hepatobiliary: No focal liver abnormality.Full gallbladder with heterogeneous mural enhancement and mild pericholecystic edema. Layering sludge or calculi seen towards the fundus. No bile duct dilatation Pancreas: Unremarkable. Spleen: Unremarkable. Adrenals/Urinary Tract: Negative adrenals. No hydronephrosis or stone. Renal sinus and right renal cortical cysts. Decompressed bladder which is essentially completely obscured by artifact from hip prostheses. Stomach/Bowel: Extensive distal colonic diverticulosis without acute inflammation. No pericecal inflammation. No bowel obstruction Vascular/Lymphatic: Atheromatous calcification. No acute vascular finding. No mass or adenopathy. Reproductive:Negative Other: No ascites or pneumoperitoneum. Musculoskeletal: Bilateral hip prosthesis. Bilateral sacral plasty. Lower lumbar laminectomy. Osteopenia and mild dextrocurvature of the lumbar spine. Remote left inferior pubic ramus fracture. IMPRESSION: Cholecystitis. Calcified gallstones are not visualized and ultrasound would be contributory. Electronically Signed   By: Monte Fantasia M.D.   On: 04/26/2019 11:35   US Abdomen Limited Ruq  Result Date: 04/26/2019 CLINICAL DATA:  78 year old female with right upper quadrant pain CT demonstrates cholecystitis EXAM: ULTRASOUND  ABDOMEN LIMITED RIGHT UPPER QUADRANT COMPARISON:  CT 04/26/2019 FINDINGS: Gallbladder: Sonographic Murphy's sign recorded as negative. Gallbladder wall thickening measuring 6 mm. Hyperechoic material with posterior shadowing layer dependently within the gallbladder lumen, compatible with recent CT. Trace pericholecystic fluid. Common bile duct: Diameter: 3 mm Liver: No focal lesion identified. Within normal limits in parenchymal echogenicity. Portal vein is patent on color Doppler imaging with normal direction of blood flow towards the liver. IMPRESSION: Sonographic survey demonstrates cholelithiasis. The ultrasound study is equivocal for acute cholecystitis, as the sonographic Murphy's sign is negative, while there are suspicious findings of gallbladder wall thickening and pericholecystic fluid in the setting of cholelithiasis. If a confirmatory imaging test is needed for acute calculus cholecystitis, HIDA study would be the test of choice. Electronically Signed   By: Corrie Mckusick D.O.   On: 04/26/2019 12:20    A/P:  Aortic stenosis status post TAVR, last Plavix was 6/27 New onset atrial fibrillation noted at [facility]  Cardiology consulted by primary team for further evaluation and preoperative clearance; planning to see in the a.m. Hypertension Hyperlipidemia COPD  Acute cholecystitis: IV antibiotics (zosyn), fluid resuscitation, pain and nausea control, n.p.o. at midnight (okay for clears up till midnight), and cardiology consultation pending for preoperative clearance.  Potential OR tomorrow.  I discussed with patient recommendation for and technique of laparoscopic cholecystectomy with possible cholangiogram. Discussed risks of surgery including bleeding, pain, scarring, intraabdominal injury specifically to the common bile duct and sequelae, bile leak, conversion to open surgery, blood clot, pneumonia, heart attack/arrhythmia, stroke, failure to resolve symptoms,  etc. Questions welcomed and answered.   She is agreeable to proceed.  Discussed that Dr. Barry Dienes will be the surgeon this week and she will meet her prior to surgery.    Romana Juniper, MD [facility] Surgery, [place] Pager [phone]

## 2019-04-26 NOTE — Progress Notes (Signed)
Dr Doristine Bosworth informed of patient arrival to Rm [id number]

## 2019-04-26 NOTE — ED Triage Notes (Signed)
Last night started vomiting around 2030 with pain to RUQ, denies diarrhea. Also has felt SOB this am

## 2019-04-26 NOTE — ED Provider Notes (Signed)
[facility] EMERGENCY DEPARTMENT Provider Note   CSN: [id number] Arrival date & time: 04/26/19  0945     History   Chief Complaint Chief Complaint  Patient presents with  . Abdominal Pain    HPI Dominique [name] is a 78 y.o. female.     Patient is a 78 year old female with past medical history of pulmonary hypertension, hypertension, recent transcatheter aortic valve replacement, and thoracic ascending aortic aneurysm.  Patient presents today with complaints of abdominal pain, nausea, and vomiting.  This started yesterday evening and has persisted through the night.  Dominique [name] describes pain in the right upper quadrant that is constant and worse with palpation.  Dominique [name] denies any constipation or diarrhea.  Dominique [name] denies any bloody stool or vomit.  The history is provided by the patient.  Abdominal Pain Pain location:  RUQ Pain quality: cramping   Pain radiates to:  Does not radiate Pain severity:  Moderate Onset quality:  Sudden Duration:  12 hours Timing:  Constant Progression:  Worsening Chronicity:  New Relieved by:  Nothing Worsened by:  Nothing Ineffective treatments:  None tried   Past Medical History:  Diagnosis Date  . Emphysema lung ([place])   . Hyperlipidemia    LDL goal < 70  . Hypertension   . NSVT (nonsustained ventricular tachycardia) ([place]) 03/02/2019  . Pulmonary HTN ([place]) 06/05/2016   Moderate with PASP 69mmHg by echo 04/2016 likely Group 3 from COPD and possibly Group 2 from pulmonary venous HTN associated with moderate AS  . Pulmonary nodules    seen on pre TAVR CT, needs 12 month CT follow up  . 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 Sacral fracture ([place])   . Severe aortic stenosis   . Thoracic ascending aortic aneurysm [facility])    needs yearly follow up    Patient Active Problem List   Diagnosis Date Noted  . NSVT (nonsustained ventricular  tachycardia) ([place]) 03/02/2019  . Ischemic leg 01/01/2019  . Thoracic ascending aortic aneurysm ([place]) 12/30/2018  . S/P TAVR (transcatheter aortic valve replacement) 12/30/2018  . CAD (coronary artery disease), native coronary artery   . Multiple lung nodules 06/15/2016  . Severe aortic stenosis   . PCP NOTES >>>>>>> 10/15/2015  . Vitamin D deficiency 10/14/2015  . Sacral fracture ([place])   . Sciatica 06/08/2015  . History of colonic polyps 03/30/2015  . Hypocalcemia 12/27/2014  . Essential hypertension 03/19/2014  . Hyperlipidemia 03/19/2014  . COPD GOLD II 03/19/2014    Past Surgical History:  Procedure Laterality Date  . APPENDECTOMY  1951  . CATARACT EXTRACTION    . CESAREAN SECTION     1971  . COLONOSCOPY  2018  . FEMORAL-POPLITEAL BYPASS GRAFT Left 12/31/2018   Procedure: PATCH ANGIOPLASTY REPAIR LEFT FEMORAL ARTERY USING HEMASHIELD PLATINUM FINESSE PATCH;  Surgeon: Rosetta Posner, MD;  Location: [place];  Service: Vascular;  Laterality: Left;  . IR RADIOLOGY PERIPHERAL GUIDED IV START  05/30/2017  . IR RADIOLOGY PERIPHERAL GUIDED IV START  09/02/2018  . IR RADIOLOGY PERIPHERAL GUIDED IV START  09/02/2018  . IR US GUIDE VASC ACCESS LEFT  09/02/2018  . IR US GUIDE VASC ACCESS RIGHT  05/30/2017  . IR US GUIDE VASC ACCESS RIGHT  09/02/2018  . JOINT REPLACEMENT  2016   sacroplasty  . LAMINECTOMY    . RIGHT/LEFT HEART CATH AND CORONARY ANGIOGRAPHY N/A 11/27/2018   Procedure:  RIGHT/LEFT HEART CATH AND CORONARY ANGIOGRAPHY;  Surgeon: Sherren Mocha, MD;  Location: [place] CV [facility];  Service: Cardiovascular;  Laterality: N/A;  . TEE WITHOUT CARDIOVERSION N/A 12/30/2018   Procedure: TRANSESOPHAGEAL ECHOCARDIOGRAM (TEE);  Surgeon: Sherren Mocha, MD;  Location: [facility];  Service: Open Heart Surgery;  Laterality: N/A;  . TOTAL HIP ARTHROPLASTY Bilateral   . TRANSCATHETER AORTIC VALVE REPLACEMENT, TRANSFEMORAL N/A 12/30/2018   Procedure: TRANSCATHETER AORTIC VALVE REPLACEMENT,  TRANSFEMORAL;  Surgeon: Sherren Mocha, MD;  Location: [facility];  Service: Open Heart Surgery;  Laterality: N/A;  . TUBAL LIGATION  1975     OB History   No obstetric history on file.      Home Medications    Prior to Admission medications   Medication Sig Start Date End Date Taking? Authorizing Provider  albuterol (PROVENTIL HFA;VENTOLIN HFA) 108 (90 Base) MCG/ACT inhaler Inhale 2 puffs into the lungs every 6 (six) hours as needed for wheezing.    [provider]  amLODipine (NORVASC) 10 MG tablet Take 1 tablet (10 mg total) by mouth daily. 02/19/19 05/20/19  Eileen Stanford, PA-C  amoxicillin (AMOXIL) 500 MG tablet Take 4 tablets (2,000mg ) one hour prior to all dental visits. Patient not taking: Reported on 03/27/2019 01/07/19   Eileen Stanford, PA-C  aspirin EC 81 MG tablet Take 81 mg by mouth daily.    [provider]  benazepril (LOTENSIN) 20 MG tablet Take 1 tablet (20 mg total) by mouth daily. 08/14/18   Colon Branch, MD  Calcium Carb-Cholecalciferol (CALCIUM 600 + D PO) Take 600 mg by mouth 3 (three) times a week.     [provider]  Cholecalciferol (VITAMIN D3) 5000 UNITS CAPS Take 5,000 Units by mouth daily.     [provider]  clopidogrel (PLAVIX) 75 MG tablet Take 1 tablet (75 mg total) by mouth daily with breakfast. 01/01/19   Eileen Stanford, PA-C  denosumab (PROLIA) 60 MG/ML SOLN injection Inject 60 mg into the skin every 6 (six) months. Administer in upper arm, thigh, or abdomen    [provider]  EPINEPHrine 0.3 mg/0.3 mL IJ SOAJ injection Inject 0.3 mg into the muscle as needed for anaphylaxis.  12/01/17   [provider]  loratadine (CLARITIN) 10 MG tablet Take 10 mg by mouth daily.    [provider]  magnesium oxide (MAG-OX) 400 (241.3 Mg) MG tablet Take 1 tablet (400 mg total) by mouth daily. 03/02/19   [name], MD  metoprolol succinate (TOPROL XL) 25 MG 24 hr tablet Take 1 tablet  (25 mg total) by mouth daily. 03/02/19   [name], MD  Multiple Vitamin (MULTIVITAMIN WITH MINERALS) TABS tablet Take 1 tablet by mouth daily.    [provider]  rosuvastatin (CRESTOR) 40 MG tablet TAKE 1 TABLET BY MOUTH EVERY DAY 08/14/18   [name], MD  triamcinolone (NASACORT ALLERGY 24HR) 55 MCG/ACT AERO nasal inhaler Place 1 spray into the nose daily as needed (congestion).    [provider]    Family History Family History  Problem Relation Age of Onset  . Breast cancer Mother        breast  . Diabetes Son        type 1  . Cancer Brother        lung  . Colon cancer Neg Hx   . CAD Neg Hx   . Colon polyps Neg Hx   . Rectal cancer Neg Hx   .  Stomach cancer Neg Hx     Social History Social History   Tobacco Use  . Smoking status: Former Smoker    Packs/day: 1.00    Years: 40.00    Pack years: 40.00    Types: Cigarettes    Quit date: 03/19/2004    Years since quitting: 15.1  . Smokeless tobacco: Never Used  Substance Use Topics  . Alcohol use: Yes    Alcohol/week: 3.0 - 4.0 standard drinks    Types: 3 - 4 Glasses of wine per week    Comment: socially  . Drug use: No     Allergies   Patient has no known allergies.   Review of Systems Review of Systems  Gastrointestinal: Positive for abdominal pain.  All other systems reviewed and are negative.    Physical Exam Updated Vital Signs BP 127/87 (BP Location: Right Arm)   Pulse (!) 108   Temp 98.2 F (36.8 C) (Oral)   Resp (!) 24   SpO2 94%   Physical Exam Vitals signs and nursing note reviewed.  Constitutional:      General: Dominique [name] is not in acute distress.    Appearance: Dominique [name] is well-developed. Dominique [name] is not diaphoretic.  HENT:     Head: Normocephalic and atraumatic.  Neck:     Musculoskeletal: Normal range of motion and neck supple.  Cardiovascular:     Rate and Rhythm: Normal rate and regular rhythm.     Heart sounds: No murmur. No friction rub. No gallop.    Pulmonary:     Effort: Pulmonary effort is normal. No respiratory distress.     Breath sounds: Normal breath sounds. No wheezing.  Abdominal:     General: Bowel sounds are normal. There is no distension.     Palpations: Abdomen is soft.     Tenderness: There is abdominal tenderness in the right upper quadrant and epigastric area. There is no right CVA tenderness, left CVA tenderness, guarding or rebound.  Musculoskeletal: Normal range of motion.  Skin:    General: Skin is warm and dry.  Neurological:     Mental Status: Dominique [name] is alert and oriented to person, place, and time.      ED Treatments / Results  Labs (all labs ordered are listed, but only abnormal results are displayed) Labs Reviewed  LIPASE, BLOOD  CBC WITH DIFFERENTIAL/PLATELET  COMPREHENSIVE METABOLIC PANEL  TROPONIN I (HIGH SENSITIVITY)  TROPONIN I (HIGH SENSITIVITY)  URINALYSIS, ROUTINE W REFLEX MICROSCOPIC    EKG EKG Interpretation  Date/Time:  Sunday April 26 2019 09:57:25 EDT Ventricular Rate:  129 PR Interval:    QRS Duration: 90 QT Interval:  325 QTC Calculation: 477 R Axis:   51 Text Interpretation:  Atrial fibrillation Borderline repolarization abnormality Confirmed by Veryl Speak [phone]) on 04/26/2019 10:01:07 AM   Radiology No results found.  Procedures Procedures (including critical care time)  Medications Ordered in ED Medications  ondansetron (ZOFRAN) injection 4 mg (has no administration in time range)  sodium chloride 0.9 % bolus 500 mL (has no administration in time range)     Initial Impression / Assessment and Plan / ED Course  I have reviewed the triage vital signs and the nursing notes.  Pertinent labs & imaging results that were available during my care of the patient were reviewed by me and considered in my medical decision making (see chart for details).  Patient presenting with right upper quadrant pain.  Dominique [name] has a white count of nearly 21,000 and CT scan confirms  acute  cholecystitis.  Patient given Zosyn.  Care discussed with Dr. Windle Guard from general surgery.  Dominique [name] would like the patient to be admitted to the hospitalist service.  Care discussed also with Dr. Doristine Bosworth who agrees to accept the patient in transfer.  Also, patient did present here initially in atrial fibrillation which Dominique [name] denies ever having had before.  Dominique [name] has since converted back to a sinus rhythm.  EKG appears similar to prior studies.  CRITICAL CARE Performed by: Veryl Speak Total critical care time: 35 minutes Critical care time was exclusive of separately billable procedures and treating other patients. Critical care was necessary to treat or prevent imminent or life-threatening deterioration. Critical care was time spent personally by me on the following activities: development of treatment plan with patient and/or surrogate as well as nursing, discussions with consultants, evaluation of patient's response to treatment, examination of patient, obtaining history from patient or surrogate, ordering and performing treatments and interventions, ordering and review of laboratory studies, ordering and review of radiographic studies, pulse oximetry and re-evaluation of patient's condition.   Final Clinical Impressions(s) / ED Diagnoses   Final diagnoses:  None    ED Discharge Orders    None       Veryl Speak, MD 04/26/19 1306

## 2019-04-26 NOTE — ED Notes (Signed)
US in room 

## 2019-04-26 NOTE — ED Notes (Signed)
Patient transported to CT 

## 2019-04-26 NOTE — Plan of Care (Signed)
Doing well.  No reports of pain.

## 2019-04-26 NOTE — H&P (Addendum)
History and Physical    Dominique [name] BSW:[id number] DOB: [birth year] DOA: 04/26/2019  PCP: Colon Branch, MD  Patient coming from: [facility]  I have personally briefly reviewed patient's old medical records in [place]  Chief Complaint: RUQ PAIN  HPI: Dominique [name] is a 78 y.o. female with medical history significant for aortic valve disease status post TAVR March 3 on Plavix last dose Saturday, June 27 as well as hypertension, hyperlipidemia, COPD, and history of nonsustained V. Tach. Patient presented to [place] with reported abdominal pain that came on yesterday at 8:30 PM.  She reported right upper quadrant abdominal pain spreading to generalized abdominal pain with nausea and vomiting till approximately 3 AM.  Patient reports it self resolved and she went to sleep and was still feeling poorly.  She woke up with persistent abdominal pain again vomiting.  She presented to [place] for further evaluation.  There patient and work-up revealing pericholecystic edema with a normal common bile duct.  Because of presentation consistent with an acute cholecystitis picture the case was discussed with general surgery because of medical problems as above they requested hospitalist be contacted.  They in turn admitted the patient's for further eval and treatment.   ED Course: BMP normal for CO2 25 BUN and creatinine 17 and 0.7 AST and ALT of 36 and 25 with a total bili of 0.5 and alk phos of 60.  She did have a's white count of 21,000 with a negative UA.  CT of abdomen pelvis was concerning for cholecystitis with a normal common bile duct.  Of note patient was reported to have atrial fibrillation at outside facility but converted to a normal sinus rhythm there.  She also received Zosyn x1.  The case discussed with general surgery because of complex medical problems are requested hospitalist to admit as above  Review of Systems: As per HPI otherwise 10 point review of systems  done notable for abdominal pain nausea and vomiting, all other reviewed and are negative Past Medical History:  Diagnosis Date  . Emphysema lung ([place])   . Hyperlipidemia    LDL goal < 70  . Hypertension   . NSVT (nonsustained ventricular tachycardia) ([place]) 03/02/2019  . Pulmonary HTN ([place]) 06/05/2016   Moderate with PASP 50mHg by echo 04/2016 likely Group 3 from COPD and possibly Group 2 from pulmonary venous HTN associated with moderate AS  . Pulmonary nodules    seen on pre TAVR CT, needs 12 month CT follow up  . S/P TAVR (transcatheter aortic valve replacement) 12/30/2018   Medtronic Evolut Pro-Plus THV (size 26 mm, serial # [id number] via the TF approach  . Sacral fracture ([place]   . Severe aortic stenosis   . Thoracic ascending aortic aneurysm ([facility]    needs yearly follow up    Past Surgical History:  Procedure Laterality Date  . APPENDECTOMY  1951  . CATARACT EXTRACTION    . CESAREAN SECTION     1971  . COLONOSCOPY  2018  . FEMORAL-POPLITEAL BYPASS GRAFT Left 12/31/2018   Procedure: PATCH ANGIOPLASTY REPAIR LEFT FEMORAL ARTERY USING HEMASHIELD PLATINUM FINESSE PATCH;  Surgeon: ERosetta Posner MD;  [place] [facility]  Service: Vascular;  Laterality: Left;  . IR RADIOLOGY PERIPHERAL GUIDED IV START  05/30/2017  . IR RADIOLOGY PERIPHERAL GUIDED IV START  09/02/2018  . IR RADIOLOGY PERIPHERAL GUIDED IV START  09/02/2018  . IR [place] VASC ACCESS LEFT  09/02/2018  .  IR US GUIDE VASC ACCESS RIGHT  05/30/2017  . IR US GUIDE VASC ACCESS RIGHT  09/02/2018  . JOINT REPLACEMENT  2016   sacroplasty  . LAMINECTOMY    . RIGHT/LEFT HEART CATH AND CORONARY ANGIOGRAPHY N/A 11/27/2018   Procedure: RIGHT/LEFT HEART CATH AND CORONARY ANGIOGRAPHY;  Surgeon: Sherren Mocha, MD;  Location: [place] CV LAB;  Service: Cardiovascular;  Laterality: N/A;  . TEE WITHOUT CARDIOVERSION N/A 12/30/2018   Procedure: TRANSESOPHAGEAL ECHOCARDIOGRAM (TEE);  Surgeon: Sherren Mocha, MD;  Location: [place] CV LAB;   Service: Open Heart Surgery;  Laterality: N/A;  . TOTAL HIP ARTHROPLASTY Bilateral   . TRANSCATHETER AORTIC VALVE REPLACEMENT, TRANSFEMORAL N/A 12/30/2018   Procedure: TRANSCATHETER AORTIC VALVE REPLACEMENT, TRANSFEMORAL;  Surgeon: Sherren Mocha, MD;  Location: [place] CV LAB;  Service: Open Heart Surgery;  Laterality: N/A;  . [place]     reports that she quit smoking about 15 years ago. Her smoking use included cigarettes. She has a 40.00 pack-year smoking history. She has never used smokeless tobacco. She reports current alcohol use of about 3.0 - 4.0 standard drinks of alcohol per week. She reports that she does not use drugs.  No Known Allergies  Family History  Problem Relation Age of Onset  . Breast cancer Mother        breast  . Diabetes Son        type 1  . Cancer Brother        lung  . Colon cancer Neg Hx   . CAD Neg Hx   . Colon polyps Neg Hx   . Rectal cancer Neg Hx   . Stomach cancer Neg Hx      Prior to Admission medications   Medication Sig Start Date End Date Taking? Authorizing Provider  albuterol (PROVENTIL HFA;VENTOLIN HFA) 108 (90 Base) MCG/ACT inhaler Inhale 2 puffs into the lungs every 6 (six) hours as needed for wheezing.    [provider]  amLODipine (NORVASC) 10 MG tablet Take 1 tablet (10 mg total) by mouth daily. 02/19/19 05/20/19  Eileen Stanford, PA-C  amoxicillin (AMOXIL) 500 MG tablet Take 4 tablets (2,026m) one hour prior to all dental visits. Patient not taking: Reported on 03/27/2019 01/07/19   TEileen Stanford PA-C  aspirin EC 81 MG tablet Take 81 mg by mouth daily.    [provider]  benazepril (LOTENSIN) 20 MG tablet Take 1 tablet (20 mg total) by mouth daily. 08/14/18   PColon Branch MD  Calcium Carb-Cholecalciferol (CALCIUM 600 + D PO) Take 600 mg by mouth 3 (three) times a week.     [provider]  Cholecalciferol (VITAMIN D3) 5000 UNITS CAPS Take 5,000 Units by mouth daily.     [provider]  clopidogrel (PLAVIX) 75 MG tablet Take 1 tablet (75 mg total) by mouth daily with breakfast. 01/01/19   TEileen Stanford PA-C  denosumab (PROLIA) 60 MG/ML SOLN injection Inject 60 mg into the skin every 6 (six) months. Administer in upper arm, thigh, or abdomen    [provider]  EPINEPHrine 0.3 mg/0.3 mL IJ SOAJ injection Inject 0.3 mg into the muscle as needed for anaphylaxis.  12/01/17   [provider]  loratadine (CLARITIN) 10 MG tablet Take 10 mg by mouth daily.    [provider]  magnesium oxide (MAG-OX) 400 (241.3 Mg) MG tablet Take 1 tablet (400 mg total) by mouth daily. 03/02/19   RSkeet Latch MD  metoprolol  succinate (TOPROL XL) 25 MG 24 hr tablet Take 1 tablet (25 mg total) by mouth daily. 03/02/19   [name], MD  Multiple Vitamin (MULTIVITAMIN WITH MINERALS) TABS tablet Take 1 tablet by mouth daily.    [provider]  rosuvastatin (CRESTOR) 40 MG tablet TAKE 1 TABLET BY MOUTH EVERY DAY 08/14/18   [name], MD  triamcinolone (NASACORT ALLERGY 24HR) 55 MCG/ACT AERO nasal inhaler Place 1 spray into the nose daily as needed (congestion).    [provider]    Physical Exam: Vitals:   04/26/19 1520 04/26/19 1547 04/26/19 1634 04/26/19 1721  BP:  (!) 120/51 110/74   Pulse: 78 80 74   Resp: (!) _0 Temp:   99 F (37.2 C)   TempSrc:   Oral   SpO2: 100% 100% 99%   Weight:    72.7 kg  Height:    5' 6" (1.676 m)    Constitutional: NAD, calm, comfortable Vitals:   04/26/19 1520 04/26/19 1547 04/26/19 1634 04/26/19 1721  BP:  (!) 120/51 110/74   Pulse: 78 80 74   Resp: (!) _1 Temp:   99 F (37.2 C)   TempSrc:   Oral   SpO2: 100% 100% 99%   Weight:    72.7 kg  Height:    5' 6" (1.676 m)   Eyes: PERRL, lids and conjunctivae normal ENMT: Mucous membranes are moist. Posterior pharynx clear of any exudate or lesions.Normal dentition.  Neck: normal, supple, no masses, no thyromegaly  Respiratory: clear to auscultation bilaterally, no wheezing, no crackles. Normal respiratory effort. No accessory muscle use.  Cardiovascular: Regular rate and rhythm, no murmurs / rubs / gallops. No extremity edema. 2+ pedal pulses. No carotid bruits.  Abdomen: Tender to palpation right upper quadrant no rebound no guarding not rigid distant bowel sounds.  Musculoskeletal: no clubbing / cyanosis. No joint deformity upper and lower extremities. Good ROM, no contractures. Normal muscle tone.  Skin: no rashes, lesions, ulcers. No induration Neurologic: CN 2-12 grossly intact. Sensation intact, DTR normal. Strength 5/5 in all 4.  Psychiatric: Normal judgment and insight. Alert and oriented x 3. Normal mood.     Labs on Admission: I have personally reviewed following labs and imaging studies  CBC: Recent Labs  Lab 04/26/19 1013  WBC 20.6*  NEUTROABS 18.2*  HGB 13.8  HCT 42.9  MCV 91.9  PLT 867   Basic Metabolic Panel: Recent Labs  Lab 04/26/19 1013  NA 138  K 3.8  CL 102  CO2 25  GLUCOSE 157*  BUN 17  CREATININE 0.70  CALCIUM 10.2   GFR: Estimated Creatinine Clearance: 59.2 mL/min (by C-G formula based on SCr of 0.7 mg/dL). Liver Function Tests: Recent Labs  Lab 04/26/19 1013  AST 36  ALT 25  ALKPHOS 60  BILITOT 0.5  PROT 7.3  ALBUMIN 4.1   Recent Labs  Lab 04/26/19 1013  LIPASE 28   No results for input(s): AMMONIA in the last 168 hours. Coagulation Profile: No results for input(s): INR, PROTIME in the last 168 hours. Cardiac Enzymes: No results for input(s): CKTOTAL, CKMB, CKMBINDEX, TROPONINI in the last 168 hours. BNP (last 3 results) No results for input(s): PROBNP in the last 8760 hours. HbA1C: No results for input(s): HGBA1C in the last 72 hours. CBG: No results for input(s): GLUCAP in the last 168 hours. Lipid Profile: No results for input(s): CHOL, HDL, LDLCALC, TRIG, CHOLHDL, LDLDIRECT in the last 72  hours. Thyroid Function Tests: No results  for input(s): TSH, T4TOTAL, FREET4, T3FREE, THYROIDAB in the last 72 hours. Anemia Panel: No results for input(s): VITAMINB12, FOLATE, FERRITIN, TIBC, IRON, RETICCTPCT in the last 72 hours. Urine analysis:    Component Value Date/Time   COLORURINE YELLOW 04/26/2019 1130   APPEARANCEUR CLEAR 04/26/2019 1130   LABSPEC 1.010 04/26/2019 1130   PHURINE 6.5 04/26/2019 1130   GLUCOSEU NEGATIVE 04/26/2019 1130   GLUCOSEU NEGATIVE 12/27/2014 1408   HGBUR NEGATIVE 04/26/2019 1130   BILIRUBINUR NEGATIVE 04/26/2019 1130   BILIRUBINUR Negative 07/08/2015 1324   KETONESUR NEGATIVE 04/26/2019 1130   PROTEINUR NEGATIVE 04/26/2019 1130   UROBILINOGEN 0.2 07/08/2015 1324   UROBILINOGEN 0.2 12/27/2014 1408   NITRITE NEGATIVE 04/26/2019 1130   LEUKOCYTESUR NEGATIVE 04/26/2019 1130    Radiological Exams on Admission: Dg Chest 2 View  Result Date: 04/26/2019 CLINICAL DATA:  Shortness of breath EXAM: CHEST - 2 VIEW COMPARISON:  December 30, 2018 FINDINGS: There is no appreciable edema or consolidation. Heart size and pulmonary vascularity are normal. There is an aortic valve replacement. No adenopathy. No bone lesions. No pneumothorax. IMPRESSION: Status post aortic valve replacement. No edema or consolidation. Heart size normal. Electronically Signed   By: Lowella Grip III M.D.   On: 04/26/2019 11:35   Ct Abdomen Pelvis W Contrast  Result Date: 04/26/2019 CLINICAL DATA:  Right upper quadrant pain with nausea and vomiting beginning last night EXAM: CT ABDOMEN AND PELVIS WITH CONTRAST TECHNIQUE: Multidetector CT imaging of the abdomen and pelvis was performed using the standard protocol following bolus administration of intravenous contrast. CONTRAST:  139m OMNIPAQUE IOHEXOL 300 MG/ML  SOLN COMPARISON:  12/09/2018 FINDINGS: Lower chest: Transcatheter aortic valve replacement. No acute finding Hepatobiliary: No focal liver abnormality.Full gallbladder with heterogeneous mural enhancement and mild  pericholecystic edema. Layering sludge or calculi seen towards the fundus. No bile duct dilatation Pancreas: Unremarkable. Spleen: Unremarkable. Adrenals/Urinary Tract: Negative adrenals. No hydronephrosis or stone. Renal sinus and right renal cortical cysts. Decompressed bladder which is essentially completely obscured by artifact from hip prostheses. Stomach/Bowel: Extensive distal colonic diverticulosis without acute inflammation. No pericecal inflammation. No bowel obstruction Vascular/Lymphatic: Atheromatous calcification. No acute vascular finding. No mass or adenopathy. Reproductive:Negative Other: No ascites or pneumoperitoneum. Musculoskeletal: Bilateral hip prosthesis. Bilateral sacral plasty. Lower lumbar laminectomy. Osteopenia and mild dextrocurvature of the lumbar spine. Remote left inferior pubic ramus fracture. IMPRESSION: Cholecystitis. Calcified gallstones are not visualized and ultrasound would be contributory. Electronically Signed   By: JMonte FantasiaM.D.   On: 04/26/2019 11:35   [place]domen Limited Ruq  Result Date: 04/26/2019 CLINICAL DATA:  78year old female with right upper quadrant pain CT demonstrates cholecystitis EXAM: ULTRASOUND ABDOMEN LIMITED RIGHT UPPER QUADRANT COMPARISON:  CT 04/26/2019 FINDINGS: Gallbladder: Sonographic Murphy's sign recorded as negative. Gallbladder wall thickening measuring 6 mm. Hyperechoic material with posterior shadowing layer dependently within the gallbladder lumen, compatible with recent CT. Trace pericholecystic fluid. Common bile duct: Diameter: 3 mm Liver: No focal lesion identified. Within normal limits in parenchymal echogenicity. Portal vein is patent on color Doppler imaging with normal direction of blood flow towards the liver. IMPRESSION: Sonographic survey demonstrates cholelithiasis. The ultrasound study is equivocal for acute cholecystitis, as the sonographic Murphy's sign is negative, while there are suspicious findings of gallbladder  wall thickening and pericholecystic fluid in the setting of cholelithiasis. If a confirmatory imaging test is needed for acute calculus cholecystitis, HIDA study would be the test of choice. Electronically Signed   By: JCorrie Mckusick  D.O.   On: 04/26/2019 12:20    EKG: Independently reviewed.  Personally reviewed no evidence of A. fib normal sinus rhythm  Assessment/Plan Active Problems:   Acute cholecystitis  aortic stenosis s/p TAVR HTN HLD COPD   Acute cholecystitis.  Patient made n.p.o., hold Plavix last dose June 27, discussed the case with cardiology on-call who agreed with holding for surgery, general surgery consult placed.  Analgesics as needed  Aortic stenosis status post TAVR.  As above discussed the case with cardiology on-call who will see in consultation.  I discussed anticoagulation patient is on his Plavix.  Okay to stop for the surgery and resume when cleared by surgery for resumption.  They will see the patient in consultation tomorrow.  If there is an urgent need please recontact cardiology for this evening  Hypertension continue patient's home medications without change.  Lipidemia.  Patient can resume statin on discharge  COPD patient has a 50-pack-year history of smoking although none in 15 years she is not in acute exacerbation continue home medications.  DVT prophylaxis: SCD/Compression stockings  Code Status: Full Code Status History    Date Active Date Inactive Code Status Order ID Comments User Context   12/30/2018 1326 01/01/2019 1529 Full Code [id number]  Crista Luria Inpatient   11/27/2018 4098 11/27/2018 1408 Full Code [id number]  Sherren Mocha, MD Inpatient   Advance Care Planning Activity    Advance Directive Documentation     Most Recent Value  Type of Advance Directive  Healthcare Power of Attorney, Living will  Pre-existing out of facility DNR order (yellow form or pink MOST form)  -  "MOST" Form in Place?  -     Family Communication:  none present at bedside, pt understood plan of care and is in agreement  Disposition Plan:   Patient will be an inpatient requiring expert subspecialty consultation, anticipated surgical evaluation, discontinuation of anticoagulation, as well as cardiology consultation.  Without these treatments and evaluations patient risk of life-threatening clinical deterioration Consults called: cardiology, general surgery Admission status: Inpatient   Nicolette Bang MD [facility]   If 7PM-7AM, please contact night-coverage   04/26/2019, 5:57 PM

## 2019-04-26 NOTE — Progress Notes (Signed)
Contacted by Dr. Stark Jock about pt with cholecystitis. Patient to be transferred to medicine service and gen surg will consult when she gets here.

## 2019-04-26 NOTE — ED Notes (Signed)
ED TO INPATIENT HANDOFF REPORT  ED Nurse Name and Phone #: Lucita Ferrara Name/Age/Gender Dominique [name] 78 y.o. female Room/Bed: MH09/MH09  Code Status   Code Status: Prior  Home/SNF/Other Home Patient oriented to: self, place, time and situation Is this baseline? Yes   Triage Complete: Triage complete  Chief Complaint Emesis; Weakness; SOB  Triage Note Last night started vomiting around 2030 with pain to RUQ, denies diarrhea. Also has felt SOB this am    Allergies No Known Allergies  Level of Care/Admitting Diagnosis ED Disposition    ED Disposition Condition Comment   Admit  Hospital Area: [place] [[id number]]  Level of Care: Telemetry [5]  Admit to tele based on following criteria: Other see comments  Comments: nea afib/cholecystitis  Covid Evaluation: N/A  Diagnosis: Acute cholecystitis [575.0.ICD-9-CM]  Admitting Physician: Darliss Cheney [[id number]]  Attending Physician: Darliss Cheney [[id number]]  Estimated length of stay: 3 - 4 days  Certification:: I certify this patient will need inpatient services for at least 2 midnights  PT Class (Do Not Modify): Inpatient [101]  PT Acc Code (Do Not Modify): Private [1]       B Medical/Surgery History Past Medical History:  Diagnosis Date  . Emphysema lung ([place])   . Hyperlipidemia    LDL goal < 70  . Hypertension   . NSVT (nonsustained ventricular tachycardia) ([place]) 03/02/2019  . Pulmonary HTN ([place]) 06/05/2016   Moderate with PASP 64mmHg by echo 04/2016 likely Group 3 from COPD and possibly Group 2 from pulmonary venous HTN associated with moderate AS  . Pulmonary nodules    seen on pre TAVR CT, needs 12 month CT follow up  . 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 Sacral fracture ([place])   . Severe aortic stenosis   . Thoracic ascending aortic aneurysm [facility])    needs yearly follow up   Past  Surgical History:  Procedure Laterality Date  . APPENDECTOMY  1951  . CATARACT EXTRACTION    . CESAREAN SECTION     1971  . COLONOSCOPY  2018  . FEMORAL-POPLITEAL BYPASS GRAFT Left 12/31/2018   Procedure: PATCH ANGIOPLASTY REPAIR LEFT FEMORAL ARTERY USING HEMASHIELD PLATINUM FINESSE PATCH;  Surgeon: Rosetta Posner, MD;  Location: [place];  Service: Vascular;  Laterality: Left;  . IR RADIOLOGY PERIPHERAL GUIDED IV START  05/30/2017  . IR RADIOLOGY PERIPHERAL GUIDED IV START  09/02/2018  . IR RADIOLOGY PERIPHERAL GUIDED IV START  09/02/2018  . IR US GUIDE VASC ACCESS LEFT  09/02/2018  . IR US GUIDE VASC ACCESS RIGHT  05/30/2017  . IR US GUIDE VASC ACCESS RIGHT  09/02/2018  . JOINT REPLACEMENT  2016   sacroplasty  . LAMINECTOMY    . RIGHT/LEFT HEART CATH AND CORONARY ANGIOGRAPHY N/A 11/27/2018   Procedure: RIGHT/LEFT HEART CATH AND CORONARY ANGIOGRAPHY;  Surgeon: Sherren Mocha, MD;  Location: [place] [facility];  Service: Cardiovascular;  Laterality: N/A;  . TEE WITHOUT CARDIOVERSION N/A 12/30/2018   Procedure: TRANSESOPHAGEAL ECHOCARDIOGRAM (TEE);  Surgeon: Sherren Mocha, MD;  Location: [place] [facility];  Service: Open Heart Surgery;  Laterality: N/A;  . TOTAL HIP ARTHROPLASTY Bilateral   . TRANSCATHETER AORTIC VALVE REPLACEMENT, TRANSFEMORAL N/A 12/30/2018   Procedure: TRANSCATHETER AORTIC VALVE REPLACEMENT, TRANSFEMORAL;  Surgeon: Sherren Mocha, MD;  Location: [place] [facility];  Service: Open Heart Surgery;  Laterality: N/A;  . TUBAL LIGATION  1975     A IV Location/Drains/Wounds Patient Lines/Drains/Airways Status   Active Line/Drains/Airways    Name:   Placement date:   Placement time:   Site:   Days:   Peripheral IV 04/26/19 Right Wrist   04/26/19    1011    Wrist   less than 1   Incision (Closed) 12/31/18 Groin Left   12/31/18    1707     116          Intake/Output Last 24 hours  Intake/Output Summary (Last 24 hours) at 04/26/2019 1332 Last data filed at 04/26/2019 1246 Gross  per 24 hour  Intake 506.24 ml  Output -  Net 506.24 ml    Labs/Imaging Results for orders placed or performed during the hospital encounter of 04/26/19 (from the past 48 hour(s))  Lipase, blood     Status: None   Collection Time: 04/26/19 10:13 AM  Result Value Ref Range   Lipase 28 11 - 51 U/L    Comment: Performed at [facility], [address] [place], [place] [address]  CBC with Differential     Status: Abnormal   Collection Time: 04/26/19 10:13 AM  Result Value Ref Range   WBC 20.6 (H) 4.0 - 10.5 K/uL   RBC 4.67 3.87 - 5.11 MIL/uL   Hemoglobin 13.8 12.0 - 15.0 g/dL   HCT 42.9 36.0 - 46.0 %   MCV 91.9 80.0 - 100.0 fL   MCH 29.6 26.0 - 34.0 pg   MCHC 32.2 30.0 - 36.0 g/dL   RDW 13.5 11.5 - 15.5 %   Platelets 295 150 - 400 K/uL   nRBC 0.0 0.0 - 0.2 %   Neutrophils Relative % 89 %   Neutro Abs 18.2 (H) 1.7 - 7.7 K/uL   Lymphocytes Relative 4 %   Lymphs Abs 0.9 0.7 - 4.0 K/uL   Monocytes Relative 6 %   Monocytes Absolute 1.3 (H) 0.1 - 1.0 K/uL   Eosinophils Relative 0 %   Eosinophils Absolute 0.0 0.0 - 0.5 K/uL   Basophils Relative 0 %   Basophils Absolute 0.1 0.0 - 0.1 K/uL   Immature Granulocytes 1 %   Abs Immature Granulocytes 0.13 (H) 0.00 - 0.07 K/uL    Comment: Performed at [facility], [address] [place], [place] [address]  Comprehensive metabolic panel     Status: Abnormal   Collection Time: 04/26/19 10:13 AM  Result Value Ref Range   Sodium 138 135 - 145 mmol/L   Potassium 3.8 3.5 - 5.1 mmol/L   Chloride 102 98 - 111 mmol/L   CO2 25 22 - 32 mmol/L   Glucose, Bld 157 (H) 70 - 99 mg/dL   BUN 17 8 - 23 mg/dL   Creatinine, Ser 0.70 0.44 - 1.00 mg/dL   Calcium 10.2 8.9 - 10.3 mg/dL   Total Protein 7.3 6.5 - 8.1 g/dL   Albumin 4.1 3.5 - 5.0 g/dL   AST 36 15 - 41 U/L   ALT 25 0 - 44 U/L   Alkaline Phosphatase 60 38 - 126 U/L   Total Bilirubin 0.5 0.3 - 1.2 mg/dL   GFR calc non Af Amer >60 >60 mL/min   GFR calc Af Amer >60 >60  mL/min   Anion gap 11 5 - 15    Comment: Performed at [facility], [address] [place], [place] [address]  Troponin I (High Sensitivity)     Status: None  Collection Time: 04/26/19 10:13 AM  Result Value Ref Range   Troponin I (High Sensitivity) 8 <18 ng/L    Comment: (NOTE) Elevated high sensitivity troponin I (hsTnI) values and significant  changes across serial measurements may suggest ACS but many other  chronic and acute conditions are known to elevate hsTnI results.  Refer to the "Links" section for chest pain algorithms and additional  guidance. Performed at [facility], [address] [place], [place] [address]   Urinalysis, Routine w reflex microscopic     Status: None   Collection Time: 04/26/19 11:30 AM  Result Value Ref Range   Color, Urine YELLOW YELLOW   APPearance CLEAR CLEAR   Specific Gravity, Urine 1.010 1.005 - 1.030   pH 6.5 5.0 - 8.0   Glucose, UA NEGATIVE NEGATIVE mg/dL   Hgb urine dipstick NEGATIVE NEGATIVE   Bilirubin Urine NEGATIVE NEGATIVE   Ketones, ur NEGATIVE NEGATIVE mg/dL   Protein, ur NEGATIVE NEGATIVE mg/dL   Nitrite NEGATIVE NEGATIVE   Leukocytes,Ua NEGATIVE NEGATIVE    Comment: Microscopic not done on urines with negative protein, blood, leukocytes, nitrite, or glucose < 500 mg/dL. Performed at [facility], [address] [place], [place] [address]   Troponin I (High Sensitivity)     Status: None   Collection Time: 04/26/19 12:24 PM  Result Value Ref Range   Troponin I (High Sensitivity) 7 <18 ng/L    Comment: (NOTE) Elevated high sensitivity troponin I (hsTnI) values and significant  changes across serial measurements may suggest ACS but many other  chronic and acute conditions are known to elevate hsTnI results.  Refer to the "Links" section for chest pain algorithms and additional  guidance. Performed at [facility], [address] [place], [place] [address]    Dg Chest 2  View  Result Date: 04/26/2019 CLINICAL DATA:  Shortness of breath EXAM: CHEST - 2 VIEW COMPARISON:  December 30, 2018 FINDINGS: There is no appreciable edema or consolidation. Heart size and pulmonary vascularity are normal. There is an aortic valve replacement. No adenopathy. No bone lesions. No pneumothorax. IMPRESSION: Status post aortic valve replacement. No edema or consolidation. Heart size normal. Electronically Signed   By: Lowella Grip III M.D.   On: 04/26/2019 11:35   Ct Abdomen Pelvis W Contrast  Result Date: 04/26/2019 CLINICAL DATA:  Right upper quadrant pain with nausea and vomiting beginning last night EXAM: CT ABDOMEN AND PELVIS WITH CONTRAST TECHNIQUE: Multidetector CT imaging of the abdomen and pelvis was performed using the standard protocol following bolus administration of intravenous contrast. CONTRAST:  185mL OMNIPAQUE IOHEXOL 300 MG/ML  SOLN COMPARISON:  12/09/2018 FINDINGS: Lower chest: Transcatheter aortic valve replacement. No acute finding Hepatobiliary: No focal liver abnormality.Full gallbladder with heterogeneous mural enhancement and mild pericholecystic edema. Layering sludge or calculi seen towards the fundus. No bile duct dilatation Pancreas: Unremarkable. Spleen: Unremarkable. Adrenals/Urinary Tract: Negative adrenals. No hydronephrosis or stone. Renal sinus and right renal cortical cysts. Decompressed bladder which is essentially completely obscured by artifact from hip prostheses. Stomach/Bowel: Extensive distal colonic diverticulosis without acute inflammation. No pericecal inflammation. No bowel obstruction Vascular/Lymphatic: Atheromatous calcification. No acute vascular finding. No mass or adenopathy. Reproductive:Negative Other: No ascites or pneumoperitoneum. Musculoskeletal: Bilateral hip prosthesis. Bilateral sacral plasty. Lower lumbar laminectomy. Osteopenia and mild dextrocurvature of the lumbar spine. Remote left inferior pubic ramus fracture. IMPRESSION:  Cholecystitis. Calcified gallstones are not visualized and ultrasound would be contributory. Electronically Signed   By: Angelica Chessman  Watts M.D.   On: 04/26/2019 11:35   US Abdomen Limited Ruq  Result Date: 04/26/2019 CLINICAL DATA:  78 year old female with right upper quadrant pain CT demonstrates cholecystitis EXAM: ULTRASOUND ABDOMEN LIMITED RIGHT UPPER QUADRANT COMPARISON:  CT 04/26/2019 FINDINGS: Gallbladder: Sonographic Murphy's sign recorded as negative. Gallbladder wall thickening measuring 6 mm. Hyperechoic material with posterior shadowing layer dependently within the gallbladder lumen, compatible with recent CT. Trace pericholecystic fluid. Common bile duct: Diameter: 3 mm Liver: No focal lesion identified. Within normal limits in parenchymal echogenicity. Portal vein is patent on color Doppler imaging with normal direction of blood flow towards the liver. IMPRESSION: Sonographic survey demonstrates cholelithiasis. The ultrasound study is equivocal for acute cholecystitis, as the sonographic Murphy's sign is negative, while there are suspicious findings of gallbladder wall thickening and pericholecystic fluid in the setting of cholelithiasis. If a confirmatory imaging test is needed for acute calculus cholecystitis, HIDA study would be the test of choice. Electronically Signed   By: Corrie Mckusick D.O.   On: 04/26/2019 12:20    Pending Labs Unresulted Labs (From admission, onward)    Start     Ordered   04/26/19 1304  SARS Coronavirus 2 (Hosp order,Performed in [facility] lab via [employer] ID)  (Asymptomatic Patients Labs)  Once,   STAT    Question:  Rule Out  Answer:  Yes   04/26/19 1304          Vitals/Pain Today's Vitals   04/26/19 1244 04/26/19 1245 04/26/19 1300 04/26/19 1307  BP:    122/75  Pulse: 84 82 83 83  Resp:   (!) 25 (!) 21  Temp:      TempSrc:      SpO2: 100% 100% 99% 99%  PainSc:        Isolation Precautions No active isolations  Medications Medications   piperacillin-tazobactam (ZOSYN) IVPB 3.375 g ( Intravenous Rate/Dose Verify 04/26/19 1246)  ondansetron (ZOFRAN) injection 4 mg (4 mg Intravenous Given 04/26/19 1017)  sodium chloride 0.9 % bolus 500 mL (0 mLs Intravenous Stopped 04/26/19 1226)  iohexol (OMNIPAQUE) 300 MG/ML solution 100 mL (100 mLs Intravenous Contrast Given 04/26/19 1100)    Mobility walks Low fall risk   Focused Assessments Cardiac Assessment Handoff:  Cardiac Rhythm: Normal sinus rhythm Lab Results  Component Value Date   TROPONINI <0.03 12/19/2014   No results found for: DDIMER Does the Patient currently have chest pain? No     R Recommendations: See Admitting Provider Note  Report given to:   Additional Notes:

## 2019-04-26 NOTE — Progress Notes (Signed)
[facility] adm informed of patient arrival to 1408 via text page

## 2019-04-26 NOTE — ED Notes (Signed)
Report given to [facility]. 

## 2019-04-27 ENCOUNTER — Encounter (HOSPITAL_COMMUNITY): Payer: Self-pay | Admitting: Physician Assistant

## 2019-04-27 ENCOUNTER — Encounter: Payer: Self-pay | Admitting: Thoracic Surgery (Cardiothoracic Vascular Surgery)

## 2019-04-27 DIAGNOSIS — K810 Acute cholecystitis: Secondary | ICD-10-CM

## 2019-04-27 DIAGNOSIS — I472 Ventricular tachycardia: Secondary | ICD-10-CM

## 2019-04-27 DIAGNOSIS — Z01810 Encounter for preprocedural cardiovascular examination: Secondary | ICD-10-CM

## 2019-04-27 DIAGNOSIS — I480 Paroxysmal atrial fibrillation: Secondary | ICD-10-CM

## 2019-04-27 DIAGNOSIS — I10 Essential (primary) hypertension: Secondary | ICD-10-CM

## 2019-04-27 DIAGNOSIS — E785 Hyperlipidemia, unspecified: Secondary | ICD-10-CM

## 2019-04-27 DIAGNOSIS — I4891 Unspecified atrial fibrillation: Secondary | ICD-10-CM

## 2019-04-27 DIAGNOSIS — I2510 Atherosclerotic heart disease of native coronary artery without angina pectoris: Secondary | ICD-10-CM

## 2019-04-27 LAB — BASIC METABOLIC PANEL
Anion gap: 11 (ref 5–15)
BUN: 16 mg/dL (ref 8–23)
CO2: 24 mmol/L (ref 22–32)
Calcium: 8.4 mg/dL — ABNORMAL LOW (ref 8.9–10.3)
Chloride: 105 mmol/L (ref 98–111)
Creatinine, Ser: 0.83 mg/dL (ref 0.44–1.00)
GFR calc Af Amer: >60 mL/min (ref >60)
GFR calc non Af Amer: >60 mL/min (ref >60)
Glucose, Bld: 103 mg/dL — ABNORMAL HIGH (ref 70–99)
Potassium: 3.7 mmol/L (ref 3.5–5.1)
Sodium: 140 mmol/L (ref 135–145)

## 2019-04-27 LAB — CBC
HCT: 38.5 % (ref 36.0–46.0)
Hemoglobin: 12 g/dL (ref 12.0–15.0)
MCH: 29.9 pg (ref 26.0–34.0)
MCHC: 31.2 g/dL (ref 30.0–36.0)
MCV: 95.8 fL (ref 80.0–100.0)
Platelets: 224 10*3/uL (ref 150–400)
RBC: 4.02 MIL/uL (ref 3.87–5.11)
RDW: 14 % (ref 11.5–15.5)
WBC: 15.2 10*3/uL — ABNORMAL HIGH (ref 4.0–10.5)
nRBC: 0 % (ref 0.0–0.2)

## 2019-04-27 LAB — MAGNESIUM: Magnesium: 1.9 mg/dL (ref 1.7–2.4)

## 2019-04-27 LAB — SURGICAL PCR SCREEN
MRSA, PCR: NEGATIVE
Staphylococcus aureus: POSITIVE — AB

## 2019-04-27 MED ORDER — POTASSIUM CHLORIDE CRYS ER 20 MEQ PO TBCR
20.0000 meq | EXTENDED_RELEASE_TABLET | Freq: Once | ORAL | Status: AC
  Administered 2019-04-27: 20 meq via ORAL
  Filled 2019-04-27: qty 1

## 2019-04-27 MED ORDER — SODIUM CHLORIDE 0.9 % IV BOLUS
250.0000 mL | Freq: Once | INTRAVENOUS | Status: AC
  Administered 2019-04-27: 250 mL via INTRAVENOUS

## 2019-04-27 MED ORDER — METOPROLOL TARTRATE 5 MG/5ML IV SOLN
5.0000 mg | Freq: Three times a day (TID) | INTRAVENOUS | Status: DC
  Administered 2019-04-27: 5 mg via INTRAVENOUS
  Filled 2019-04-27 (×2): qty 5

## 2019-04-27 MED ORDER — MUPIROCIN 2 % EX OINT
1.0000 "application " | TOPICAL_OINTMENT | Freq: Two times a day (BID) | CUTANEOUS | Status: DC
  Administered 2019-04-27 – 2019-04-28 (×3): 1 via NASAL
  Filled 2019-04-27: qty 22

## 2019-04-27 MED ORDER — CHLORHEXIDINE GLUCONATE CLOTH 2 % EX PADS
6.0000 | MEDICATED_PAD | Freq: Every day | CUTANEOUS | Status: DC
  Administered 2019-04-27 – 2019-04-28 (×2): 6 via TOPICAL

## 2019-04-27 NOTE — Consult Note (Addendum)
Cardiology Consultation:   Patient ID: Dominique [name]; [id number]; 1940-12-28   Admit date: 04/26/2019 Date of Consult: 04/27/2019  Primary Care Provider: Colon Branch, MD Primary Cardiologist: [name], MD Primary Electrophysiologist:  None  Chief Complaint: abdominal pain, vomiting  Patient Profile:   Dominique [name] is a 78 y.o. female with a hx of aortic stenosis s/p TAVR 12/2018 (c/b ischemic L leg s/p emergent repair of left femoral artery with endarterectomy and Dacron patch angioplasty), normal coronaries by cath 10/2018, COPD, HLD, HTN, mild pulm HTN (not noted on 12/2018 echo) hypomagnesemia, NSVT (by event monitor 01/2019), thoracic aortic aneurysm, pulmonary nodules  who is being seen today for the evaluation of pre-op clearance and rapid atrial fib at the request of Dr. Kae Heller.  History of Present Illness:   Pre-TAVR cath 10/2018 showed angiographically normal coronaries. She underwent TAVR in 12/2018, post-op course complicated by severe acute ischemia felt related to probable arterial flap and closure site and underwent vascular procedure mentioned above. Post-op TAVR echo 12/2018 showed EF 60-65%, normal RV, normal diastolic parameters, low/stable TAVR gradient.  She had some palpitations and pre-syncope as an outpatient post-operatively. Event monitor in 01/2019 appeared to show a run of wide complex tachycardia - MD read monitor as artifactual although review of monitor does appear c/w run of VT (7 seconds per monitoring company report) - she was asymptomatic at the time. She was subsequently started on magnesium and metoprolol with improvement in symptoms.  Post-TAVR she struggled with some fatigue and dyspnea but states the last few days prior to this admission she was feeling great and able to perform all ADLs without any CP or SOB. She was admitted then admitted 6/28 with acute onset of persistent vomiting and RUQ with imaging concerning for acute cholecysitis. Labs showed  leukocytosis with WBC 20k and normal LFTs, negative troponin, Covid negative. Initial EKG notable for atrial fib RVR with f/u tracing NSR with occ PACs. Although previously symptomatic with palpitations she was unaware of her AF while it was happening. She currently feels much better, just a little RUQ pain when shifting in bed. Telemetry shows NSR since admission.   Past Medical History:  Diagnosis Date   Atrial fibrillation ([place])    a. dx 03/2019 while in hospital with cholecystitis   COPD (chronic obstructive pulmonary disease) ([place])    Critical lower limb ischemia    a. after TAVR developed ischemic L leg likely due to flap/closure, s/p emergent repair of left femoral artery with endarterectomy and Dacron patch angioplasty.   Emphysema lung (HCC)    Hyperlipidemia    LDL goal < 70   Hypertension    Hypomagnesemia    Normal coronary arteries 10/2018   NSVT (nonsustained ventricular tachycardia) ([place]) 03/02/2019   Pulmonary HTN ([place]) 06/05/2016   Moderate with PASP 52mmHg by echo 04/2016 likely Group 3 from COPD and possibly Group 2 from pulmonary venous HTN associated with moderate AS - f/u echo 12/2018 post TAVR showed normal RVSP   Pulmonary nodules    seen on pre TAVR CT, needs 12 month CT follow up   S/P TAVR (transcatheter aortic valve replacement) 12/30/2018   Medtronic Evolut Pro-Plus THV (size 26 mm, serial # [id number]) via the TF approach   Sacral fracture (HCC)    Severe aortic stenosis    a. s/p TAVR 12/2018.   Thoracic ascending aortic aneurysm [facility])    needs yearly follow up    Past Surgical History:  Procedure Laterality Date  APPENDECTOMY  1951   CATARACT EXTRACTION     CESAREAN SECTION     1971   COLONOSCOPY  2018   FEMORAL-POPLITEAL BYPASS GRAFT Left 12/31/2018   Procedure: PATCH ANGIOPLASTY REPAIR LEFT FEMORAL ARTERY USING HEMASHIELD PLATINUM FINESSE PATCH;  Surgeon: Rosetta Posner, MD;  Location: [facility];  Service: Vascular;  Laterality: Left;    IR RADIOLOGY PERIPHERAL GUIDED IV START  05/30/2017   IR RADIOLOGY PERIPHERAL GUIDED IV START  09/02/2018   IR RADIOLOGY PERIPHERAL GUIDED IV START  09/02/2018   IR US GUIDE VASC ACCESS LEFT  09/02/2018   IR US GUIDE [place] RIGHT  05/30/2017   IR US GUIDE [place] RIGHT  09/02/2018   JOINT REPLACEMENT  2016   sacroplasty   LAMINECTOMY     RIGHT/LEFT HEART CATH AND CORONARY ANGIOGRAPHY N/A 11/27/2018   Procedure: RIGHT/LEFT HEART CATH AND CORONARY ANGIOGRAPHY;  Surgeon: Sherren Mocha, MD;  Location: [facility];  Service: Cardiovascular;  Laterality: N/A;   TEE WITHOUT CARDIOVERSION N/A 12/30/2018   Procedure: TRANSESOPHAGEAL ECHOCARDIOGRAM (TEE);  Surgeon: Sherren Mocha, MD;  Location: [facility];  Service: Open Heart Surgery;  Laterality: N/A;   TOTAL HIP ARTHROPLASTY Bilateral    TRANSCATHETER AORTIC VALVE REPLACEMENT, TRANSFEMORAL N/A 12/30/2018   Procedure: TRANSCATHETER AORTIC VALVE REPLACEMENT, TRANSFEMORAL;  Surgeon: Sherren Mocha, MD;  Location: [facility];  Service: Open Heart Surgery;  Laterality: N/A;   TUBAL LIGATION  1975     Inpatient Medications: Scheduled Meds:  metoprolol tartrate  5 mg Intravenous Q8H   Continuous Infusions:  sodium chloride Stopped (04/27/19 0418)   piperacillin-tazobactam (ZOSYN)  IV 3.375 g (04/27/19 0418)   PRN Meds: acetaminophen **OR** acetaminophen, albuterol, diphenhydrAMINE, HYDROmorphone (DILAUDID) injection, ketorolac, ondansetron **OR** ondansetron (ZOFRAN) IV, traZODone  Home Meds: Prior to Admission medications   Medication Sig Start Date End Date Taking? Authorizing Provider  albuterol (PROVENTIL HFA;VENTOLIN HFA) 108 (90 Base) MCG/ACT inhaler Inhale 2 puffs into the lungs every 6 (six) hours as needed for wheezing.   Yes [provider]  amLODipine (NORVASC) 10 MG tablet Take 1 tablet (10 mg total) by mouth daily. 02/19/19 05/20/19 Yes Eileen Stanford, PA-C  aspirin EC 81 MG tablet  Take 81 mg by mouth daily.   Yes [provider]  benazepril (LOTENSIN) 20 MG tablet Take 1 tablet (20 mg total) by mouth daily. 08/14/18  Yes Paz, Alda Berthold, MD  Calcium Carb-Cholecalciferol (CALCIUM 600 + D PO) Take 600 mg by mouth 3 (three) times a week.    Yes [provider]  Cholecalciferol (VITAMIN D3) 5000 UNITS CAPS Take 5,000 Units by mouth daily.    Yes [provider]  clopidogrel (PLAVIX) 75 MG tablet Take 1 tablet (75 mg total) by mouth daily with breakfast. 01/01/19  Yes Eileen Stanford, PA-C  denosumab (PROLIA) 60 MG/ML SOLN injection Inject 60 mg into the skin every 6 (six) months. Administer in upper arm, thigh, or abdomen   Yes [provider]  EPINEPHrine 0.3 mg/0.3 mL IJ SOAJ injection Inject 0.3 mg into the muscle as needed for anaphylaxis.  12/01/17  Yes [provider]  loratadine (CLARITIN) 10 MG tablet Take 10 mg by mouth daily as needed for allergies.    Yes [provider]  magnesium oxide (MAG-OX) 400 (241.3 Mg) MG tablet Take 1 tablet (400 mg total) by mouth daily. 03/02/19  Yes [name], MD  Multiple Vitamin (MULTIVITAMIN WITH MINERALS) TABS tablet Take 1 tablet  by mouth daily.   Yes [provider]  rosuvastatin (CRESTOR) 40 MG tablet TAKE 1 TABLET BY MOUTH EVERY DAY 08/14/18  Yes [name], MD  metoprolol succinate (TOPROL XL) 25 MG 24 hr tablet Take 1 tablet (25 mg total) by mouth daily. Patient not taking: Reported on 04/26/2019 03/02/19   [name], MD    Allergies:   No Known Allergies  Social History:   Social History   Socioeconomic History   Marital status: Widowed    Spouse name: Not on file   Number of children: 2   Years of education: Not on file   Highest education level: Not on file  Occupational History   Occupation: n/a  Social Designer, fashion/clothing strain: Not on file   Food insecurity    Worry: Not on file    Inability: Not on file    Transportation needs    Medical: Not on file    Non-medical: Not on file  Tobacco Use   Smoking status: Former Smoker    Packs/day: 1.00    Years: 40.00    Pack years: 40.00    Types: Cigarettes    Quit date: 03/19/2004    Years since quitting: 15.1   Smokeless tobacco: Never Used  Substance and Sexual Activity   Alcohol use: Yes    Alcohol/week: 3.0 - 4.0 standard drinks    Types: 3 - 4 Glasses of wine per week    Comment: socially   Drug use: No   Sexual activity: Not on file  Lifestyle   Physical activity    Days per week: Not on file    Minutes per session: Not on file   Stress: Not on file  Relationships   Social connections    Talks on phone: Not on file    Gets together: Not on file    Attends religious service: Not on file    Active member of club or organization: Not on file    Attends meetings of clubs or organizations: Not on file    Relationship status: Not on file   Intimate partner violence    Fear of current or ex partner: Not on file    Emotionally abused: Not on file    Physically abused: Not on file    Forced sexual activity: Not on file  Other Topics Concern   Not on file  Social History Narrative   From [place]   1 living son    Family History:   The patient's family history includes Breast cancer in her mother; Cancer in her brother; Diabetes in her son. There is no history of Colon cancer, CAD, Colon polyps, Rectal cancer, or Stomach cancer.  ROS:  Please see the history of present illness.  All other ROS reviewed and negative.     Physical Exam/Data:   Vitals:   04/26/19 1634 04/26/19 1721 04/26/19 2030 04/27/19 0422  BP: 110/74  (!) 115/48 (!) 111/48  Pulse: 74  69 70  Resp: 18  20 (!) 23  Temp: 99 F (37.2 C)  99.1 F (37.3 C) 97.6 F (36.4 C)  TempSrc: Oral  Oral Oral  SpO2: 99%  100% 98%  Weight:  72.7 kg    Height:  5\' 6"  (1.676 m)      Intake/Output Summary (Last 24 hours) at 04/27/2019 0817 Last data filed at  04/27/2019 0600 Gross per 24 hour  Intake 1082.21 ml  Output --  Net 1082.21 ml   Last  3 Weights 04/26/2019 04/01/2019 02/19/2019  Weight (lbs) 160 lb 4.4 oz 157 lb 155 lb  Weight (kg) 72.7 kg 71.215 kg 70.308 kg    Body mass index is 25.87 kg/m.  General: Well developed, well nourished WF in no acute distress, lying flat in bed without dyspnea Head: Normocephalic, atraumatic, sclera non-icteric, no xanthomas, nares are without discharge.  Neck: Negative for carotid bruits. JVD not elevated. Lungs: Clear bilaterally to auscultation without wheezes, rales, or rhonchi. Breathing is unlabored. Heart: RRR with S1 S2. Very soft SEM RUSB. No rubs or gallops appreciated. Abdomen: Soft,  non-distended with normoactive bowel sounds. No hepatomegaly. No rebound/guarding. No obvious abdominal masses. Msk:  Strength and tone appear normal for age. Extremities: No clubbing or cyanosis. No edema.  Distal pedal pulses are 2+ and equal bilaterally. Neuro: Alert and oriented X 3. No facial asymmetry. No focal deficit. Moves all extremities spontaneously. Psych:  Responds to questions appropriately with a normal affect.  EKG:  The EKG was personally reviewed and demonstrates: 1) 6/28 @ 09:57 - atrial fib with RVR 129bpm, nonspecific STT changes 2) 6/28 @ 12:35 - NSR with occasional PACs, QTC 464ms, no acute STT changes   Relevant CV Studies: Most recent pertinent cardiac studies are outlined above.   Laboratory Data:  Chemistry Recent Labs  Lab 04/26/19 1013 04/27/19 0526  NA 138 140  K 3.8 3.7  CL 102 105  CO2 25 24  GLUCOSE 157* 103*  BUN 17 16  CREATININE 0.70 0.83  CALCIUM 10.2 8.4*  GFRNONAA >60 >60  GFRAA >60 >60  ANIONGAP 11 11    Recent Labs  Lab 04/26/19 1013  PROT 7.3  ALBUMIN 4.1  AST 36  ALT 25  ALKPHOS 60  BILITOT 0.5   Hematology Recent Labs  Lab 04/26/19 1013 04/27/19 0526  WBC 20.6* 15.2*  RBC 4.67 4.02  HGB 13.8 12.0  HCT 42.9 38.5  MCV 91.9 95.8  MCH  29.6 29.9  MCHC 32.2 31.2  RDW 13.5 14.0  PLT 295 224   Cardiac EnzymesNo results for input(s): TROPONINI in the last 168 hours. No results for input(s): TROPIPOC in the last 168 hours.  BNPNo results for input(s): BNP, PROBNP in the last 168 hours.  DDimer No results for input(s): DDIMER in the last 168 hours.  Radiology/Studies:  Dg Chest 2 View  Result Date: 04/26/2019 CLINICAL DATA:  Shortness of breath EXAM: CHEST - 2 VIEW COMPARISON:  December 30, 2018 FINDINGS: There is no appreciable edema or consolidation. Heart size and pulmonary vascularity are normal. There is an aortic valve replacement. No adenopathy. No bone lesions. No pneumothorax. IMPRESSION: Status post aortic valve replacement. No edema or consolidation. Heart size normal. Electronically Signed   By: Lowella Grip III M.D.   On: 04/26/2019 11:35   Ct Abdomen Pelvis W Contrast  Result Date: 04/26/2019 CLINICAL DATA:  Right upper quadrant pain with nausea and vomiting beginning last night EXAM: CT ABDOMEN AND PELVIS WITH CONTRAST TECHNIQUE: Multidetector CT imaging of the abdomen and pelvis was performed using the standard protocol following bolus administration of intravenous contrast. CONTRAST:  161mL OMNIPAQUE IOHEXOL 300 MG/ML  SOLN COMPARISON:  12/09/2018 FINDINGS: Lower chest: Transcatheter aortic valve replacement. No acute finding Hepatobiliary: No focal liver abnormality.Full gallbladder with heterogeneous mural enhancement and mild pericholecystic edema. Layering sludge or calculi seen towards the fundus. No bile duct dilatation Pancreas: Unremarkable. Spleen: Unremarkable. Adrenals/Urinary Tract: Negative adrenals. No hydronephrosis or stone. Renal sinus and right renal cortical cysts. Decompressed  bladder which is essentially completely obscured by artifact from hip prostheses. Stomach/Bowel: Extensive distal colonic diverticulosis without acute inflammation. No pericecal inflammation. No bowel obstruction  Vascular/Lymphatic: Atheromatous calcification. No acute vascular finding. No mass or adenopathy. Reproductive:Negative Other: No ascites or pneumoperitoneum. Musculoskeletal: Bilateral hip prosthesis. Bilateral sacral plasty. Lower lumbar laminectomy. Osteopenia and mild dextrocurvature of the lumbar spine. Remote left inferior pubic ramus fracture. IMPRESSION: Cholecystitis. Calcified gallstones are not visualized and ultrasound would be contributory. Electronically Signed   By: Monte Fantasia M.D.   On: 04/26/2019 11:35   US Abdomen Limited Ruq  Result Date: 04/26/2019 CLINICAL DATA:  78 year old female with right upper quadrant pain CT demonstrates cholecystitis EXAM: ULTRASOUND ABDOMEN LIMITED RIGHT UPPER QUADRANT COMPARISON:  CT 04/26/2019 FINDINGS: Gallbladder: Sonographic Murphy's sign recorded as negative. Gallbladder wall thickening measuring 6 mm. Hyperechoic material with posterior shadowing layer dependently within the gallbladder lumen, compatible with recent CT. Trace pericholecystic fluid. Common bile duct: Diameter: 3 mm Liver: No focal lesion identified. Within normal limits in parenchymal echogenicity. Portal vein is patent on color Doppler imaging with normal direction of blood flow towards the liver. IMPRESSION: Sonographic survey demonstrates cholelithiasis. The ultrasound study is equivocal for acute cholecystitis, as the sonographic Murphy's sign is negative, while there are suspicious findings of gallbladder wall thickening and pericholecystic fluid in the setting of cholelithiasis. If a confirmatory imaging test is needed for acute calculus cholecystitis, HIDA study would be the test of choice. Electronically Signed   By: Corrie Mckusick D.O.   On: 04/26/2019 12:20    Assessment and Plan:   1. Pre-op cardiovascular examination - this patient is previously known to have had repair of her aortic valve as well as normal coronary arteries. Her revised cardiac risk index is calculated at  0.4% indicating low risk of CV events. She has not had any unstable symptoms from a cardiac standpoint recently (aside from independent issue of newly recognized atrial fib). Will confirm with cardiology MD but do not suspect any concern about preop clearance acutely. The bigger issue will be deciding on anticoagulation post-operatively. She was on ASA/Plavix as above (in setting of TAVR + femoral repair) but may require transition to [place].  2. Newly recognized atrial fib RVR - initial EKG notable for atrial fib RVR with f/u tracing NSR with occ PACs. CHADSVASC at least 4 (HTN, age x 2, sex -> not clear if her vascular event counts as vascular disease since it was procedurally related). She has had previous hx of palpitations but was relatively asymptomatic with this episode. Will discuss plan for anticoagulation going forward with MD. Will initiate metoprolol 5mg  IV q8 hr while NPO. Check TSH with AM labs.  3. HTN - BP a little on the softer side. Will hold home antihypertensives (amlodipine, benazepril) and add metoprolol 5mg  IV q8 hours. This will allow [place] room to use AVN blocking agents should she develop recurrent AF post-operatively.  4. Aortic stenosis s/p TAVR c/b ischemic left leg s/p femoral endarterectomy and patch angioplasty - clinically stable.  5. Hx of NSVT - follow on telemetry. Resume home magnesium when taking orals regularly. See above regarding beta blocker.  If able to take oral meds from surgical team standpoint would give KCl 33meq x 1 to keep K 4.0 or greater - wrote care order for nurse to check with surgery since NPO.   For questions or updates, please contact [place] Please consult [email or web address] for contact info under Cardiology/STEMI.   Signed, Charlie Pitter, PA-C  04/27/2019 8:17 AM  The patient was seen, examined and discussed with Melina Copa, PA-C and I agree with the above.   78 y.o. female with a hx of aortic stenosis s/p TAVR 12/2018 (c/b ischemic L leg s/p  emergent repair of left femoral artery with endarterectomy and Dacron patch angioplasty), normal coronaries by cath 10/2018, COPD, HLD, HTN, mild pulm HTN (not noted on 12/2018 echo) hypomagnesemia, NSVT (by event monitor 01/2019), thoracic aortic aneurysm, pulmonary nodules  who is being seen today for the evaluation of pre-op clearance and rapid atrial fib. Post-op TAVR echo 12/2018 showed EF 60-65%, normal RV, normal diastolic parameters, low/stable TAVR gradient.  She had some palpitations and pre-syncope as an outpatient post-operatively. Event monitor in 01/2019 appeared to show a run of wide complex tachycardia - MD read monitor as artifactual although review of monitor does appear c/w run of VT (7 seconds per monitoring company report) - she was asymptomatic at the time. She was subsequently started on magnesium and metoprolol with improvement in symptoms. She was admitted then admitted 6/28 with acute onset of persistent vomiting and RUQ with imaging concerning for acute cholecysitis. Labs showed leukocytosis with WBC 20k and normal LFTs, negative troponin, Covid negative. Initial EKG notable for atrial fib RVR with f/u tracing NSR with occ PACs.  The patient has been recently less active however still walking her dog daily without any chest pain or shortness of breath, she recently underwent 30-day event monitor with no evidence for atrial fibrillation.  Given that her episode was very short and in the settings of acute cholecystitis, with normal LV function and normal left atrial size in the absence of significant mitral regurgitation.  For now I would not start full anticoagulation. Regarding preop evaluation, the patient has no signs of acute chest pain neither of congestive heart failure, her recently placed bioprosthetic valve is functioning valve with normal transaortic gradients. She does not require any further testing or interventions prior to the gallbladder surgery.  We will follow.  Ena Dawley, MD 04/27/2019

## 2019-04-27 NOTE — Progress Notes (Signed)
    CC: Right upper quadrant pain, nausea and vomiting  Subjective: She continues have pain in her right upper quadrant.  She is anxious to have surgery and go home tomorrow; it is her 78th birthday tomorrow.  Objective: Vital signs in last 24 hours: Temp:  [97.6 F (36.4 C)-99.1 F (37.3 C)] 97.6 F (36.4 C) (06/29 0422) Pulse Rate:  [58-117] 70 (06/29 0422) Resp:  [14-26] 23 (06/29 0422) BP: (104-135)/(48-75) 111/48 (06/29 0422) SpO2:  [88 %-100 %] 98 % (06/29 0422) Weight:  [72.7 kg] 72.7 kg (06/28 1721) Last BM Date: 04/24/19 1082 IV N.p.o. Urine x1 recorded Afebrile vital signs are stable. CMP is stable. WBC 20.6(6/28) >> 15.2(6/29) Right upper quadrant abdominal ultrasound 6/28: Gallbladder wall thickening measuring 6 mm.  Cholelithiasis, probable acute cholecystitis. CT scan 6/28: Cholecystitis calcified gallstones not visualized2   Intake/Output from previous day: 06/28 0701 - 06/29 0700 In: 1082.2 [I.V.:467; IV Piggyback:615.2] Out: -  Intake/Output this shift: No intake/output data recorded.  General appearance: alert, cooperative and no distress Resp: clear to auscultation bilaterally GI: Soft, tender in the right upper quadrant.  Nausea and vomiting is better.  Lab Results:  Recent Labs    04/26/19 1013 04/27/19 0526  WBC 20.6* 15.2*  HGB 13.8 12.0  HCT 42.9 38.5  PLT 295 224    BMET Recent Labs    04/26/19 1013 04/27/19 0526  NA 138 140  K 3.8 3.7  CL 102 105  CO2 25 24  GLUCOSE 157* 103*  BUN 17 16  CREATININE 0.70 0.83  CALCIUM 10.2 8.4*   PT/INR No results for input(s): LABPROT, INR in the last 72 hours.  Recent Labs  Lab 04/26/19 1013  AST 36  ALT 25  ALKPHOS 60  BILITOT 0.5  PROT 7.3  ALBUMIN 4.1     Lipase     Component Value Date/Time   LIPASE 28 04/26/2019 1013     Medications: . metoprolol tartrate  5 mg Intravenous Q8H   . sodium chloride Stopped (04/27/19 0418)  . piperacillin-tazobactam (ZOSYN)  IV  3.375 g (04/27/19 0418)    Assessment/Plan Aortic stenosis status post TAVR, last Plavix was 6/27 New onset atrial fibrillation noted at [facility]             Cardiology consulted by primary team for further evaluation and preoperative clearance; planning to see in the a.m. Hypertension Hyperlipidemia COPD  Acute cholecystitis   FEN: N.p.o./ IV fluids off ID: Zosyn 6/28 >> day 2 DVT: SCDs Follow-up: [facility] POC: Hofmann,Sven Son [phone]    Hofmann,Mariom Other   [phone]    Plan: Awaiting final clearance from cardiology.  She seems better.  She would really like to get her surgery done today but her last dose of Plavix was 04/25/19.  I have told her her surgery will probably not be until tomorrow.  I will keep her n.p.o. for now in case something changes.  LOS: 1 day    Hensley Aziz 04/27/2019 [phone]

## 2019-04-27 NOTE — Progress Notes (Signed)
PROGRESS NOTE    Dominique [name]  MVE:[id number] DOB: January 08, 1941 DOA: 04/26/2019 PCP: Colon Branch, MD   Brief Narrative:   Dominique [name] is a 78 y.o. female with medical history significant for aortic valve disease status post TAVR March 3 on Plavix last dose Saturday, June 27 as well as hypertension, hyperlipidemia, COPD, and history of nonsustained V. Tach. Patient presented to [facility] with reported abdominal pain that came on yesterday at 8:30 PM.  She reported right upper quadrant abdominal pain spreading to generalized abdominal pain with nausea and vomiting till approximately 3 AM.  Patient reports it self resolved and she went to sleep and was still feeling poorly.  She woke up with persistent abdominal pain again vomiting.  She presented to [facility] for further evaluation.  There patient and work-up revealing pericholecystic edema with a normal common bile duct.  Because of presentation consistent with an acute cholecystitis picture the case was discussed with general surgery because of medical problems as above they requested hospitalist be contacted.  They in turn admitted the patient's for further eval and treatment.   ED Course: BMP normal for CO2 25 BUN and creatinine 17 and 0.7 AST and ALT of 36 and 25 with a total bili of 0.5 and alk phos of 60.  She did have a's white count of 21,000 with a negative UA.  CT of abdomen pelvis was concerning for cholecystitis with a normal common bile duct.  Of note patient was reported to have atrial fibrillation at outside facility but converted to a normal sinus rhythm there.  She also received Zosyn x1.  The case discussed with general surgery because of complex medical problems are requested hospitalist to admit as above   Assessment & Plan:   Principal Problem:   Acute cholecystitis Active Problems:   Essential hypertension   Hyperlipidemia   COPD GOLD II   CAD (coronary artery disease), native coronary  artery   NSVT (nonsustained ventricular tachycardia) (HCC)   Acute cholecystitis.  cont n.p.o., hold ASA/Plavix last dose June 27 AM, as noted-discussed the case with cardiology on-call who agreed with holding for surgery, general surgery consult appreciated.  Analgesics as needed  Aortic stenosis status post TAVR.  As above discussed the case with cardiology on-call.  holding plavix, further recs per cards  Hypertension continue patient's home medications without change.  Lipidemia.  Patient can resume statin on discharge  COPD patient has a 50-pack-year history of smoking although none in 15 years she is not in acute exacerbation continue home medications.   DVT prophylaxis: SCD/Compression stockings  Code Status: Full    Code Status Orders  (From admission, onward)         Start     Ordered   04/26/19 1802  Full code  Continuous     04/26/19 1801        Code Status History    Date Active Date Inactive Code Status Order ID Comments User Context   12/30/2018 1326 01/01/2019 1529 Full Code [id number]  Crista Luria Inpatient   11/27/2018 6546 11/27/2018 1408 Full Code [id number]  Sherren Mocha, MD Inpatient   Advance Care Planning Activity    Advance Directive Documentation     Most Recent Value  Type of Advance Directive  Healthcare Power of Attorney, Living will  Pre-existing out of facility DNR order (yellow form or pink MOST form)  --  "MOST" Form in Place?  --  Family Communication: [phone] son, N/A, LM on vmail Disposition Plan:   Patient will remain inpatient requiring continued expert subspecialty consultation with cardiology and gen surgery, discontinuation of anticoagulation, telemetry, fluid and electrolyte monitoring.  Without these treatments and evaluations patient risk of life-threatening clinical deterioration  Consults called: None Admission status: Inpatient   Consultants:   cardiolology and gen surg  Procedures:  Dg Chest 2  View  Result Date: 04/26/2019 CLINICAL DATA:  Shortness of breath EXAM: CHEST - 2 VIEW COMPARISON:  December 30, 2018 FINDINGS: There is no appreciable edema or consolidation. Heart size and pulmonary vascularity are normal. There is an aortic valve replacement. No adenopathy. No bone lesions. No pneumothorax. IMPRESSION: Status post aortic valve replacement. No edema or consolidation. Heart size normal. Electronically Signed   By: Lowella Grip III M.D.   On: 04/26/2019 11:35   Ct Abdomen Pelvis W Contrast  Result Date: 04/26/2019 CLINICAL DATA:  Right upper quadrant pain with nausea and vomiting beginning last night EXAM: CT ABDOMEN AND PELVIS WITH CONTRAST TECHNIQUE: Multidetector CT imaging of the abdomen and pelvis was performed using the standard protocol following bolus administration of intravenous contrast. CONTRAST:  124m OMNIPAQUE IOHEXOL 300 MG/ML  SOLN COMPARISON:  12/09/2018 FINDINGS: Lower chest: Transcatheter aortic valve replacement. No acute finding Hepatobiliary: No focal liver abnormality.Full gallbladder with heterogeneous mural enhancement and mild pericholecystic edema. Layering sludge or calculi seen towards the fundus. No bile duct dilatation Pancreas: Unremarkable. Spleen: Unremarkable. Adrenals/Urinary Tract: Negative adrenals. No hydronephrosis or stone. Renal sinus and right renal cortical cysts. Decompressed bladder which is essentially completely obscured by artifact from hip prostheses. Stomach/Bowel: Extensive distal colonic diverticulosis without acute inflammation. No pericecal inflammation. No bowel obstruction Vascular/Lymphatic: Atheromatous calcification. No acute vascular finding. No mass or adenopathy. Reproductive:Negative Other: No ascites or pneumoperitoneum. Musculoskeletal: Bilateral hip prosthesis. Bilateral sacral plasty. Lower lumbar laminectomy. Osteopenia and mild dextrocurvature of the lumbar spine. Remote left inferior pubic ramus fracture. IMPRESSION:  Cholecystitis. Calcified gallstones are not visualized and ultrasound would be contributory. Electronically Signed   By: JMonte FantasiaM.D.   On: 04/26/2019 11:35   [place]domen Limited Ruq  Result Date: 04/26/2019 CLINICAL DATA:  78year old female with right upper quadrant pain CT demonstrates cholecystitis EXAM: ULTRASOUND ABDOMEN LIMITED RIGHT UPPER QUADRANT COMPARISON:  CT 04/26/2019 FINDINGS: Gallbladder: Sonographic Murphy's sign recorded as negative. Gallbladder wall thickening measuring 6 mm. Hyperechoic material with posterior shadowing layer dependently within the gallbladder lumen, compatible with recent CT. Trace pericholecystic fluid. Common bile duct: Diameter: 3 mm Liver: No focal lesion identified. Within normal limits in parenchymal echogenicity. Portal vein is patent on color Doppler imaging with normal direction of blood flow towards the liver. IMPRESSION: Sonographic survey demonstrates cholelithiasis. The ultrasound study is equivocal for acute cholecystitis, as the sonographic Murphy's sign is negative, while there are suspicious findings of gallbladder wall thickening and pericholecystic fluid in the setting of cholelithiasis. If a confirmatory imaging test is needed for acute calculus cholecystitis, HIDA study would be the test of choice. Electronically Signed   By: JCorrie MckusickD.O.   On: 04/26/2019 12:20     Antimicrobials:   Zosyn day 2    Subjective: Reports pain free until she starts to move more Resting comfortably in bed NPO  Objective: Vitals:   04/26/19 1634 04/26/19 1721 04/26/19 2030 04/27/19 0422  BP: 110/74  (!) 115/48 (!) 111/48  Pulse: 74  69 70  Resp: 18  20 (!) 23  Temp: 99 F (37.2 C)  99.1 F (37.3 C) 97.6 F (36.4 C)  TempSrc: Oral  Oral Oral  SpO2: 99%  100% 98%  Weight:  72.7 kg    Height:  5' 6" (1.676 m)      Intake/Output Summary (Last 24 hours) at 04/27/2019 1118 Last data filed at 04/27/2019 0600 Gross per 24 hour  Intake 1082.21  ml  Output --  Net 1082.21 ml   Filed Weights   04/26/19 1721  Weight: 72.7 kg    Examination:  General exam: Appears calm and comfortable  Respiratory system: Clear to auscultation. Respiratory effort normal. Cardiovascular system: S1 & S2 heard, RRR. No JVD, murmurs, rubs, gallops or clicks. No pedal edema. Gastrointestinal system: Abdomen is nondistended, soft, MOD tender. No organomegaly or masses felt. QUIET bowel sounds heard. Central nervous system: Alert and oriented. No focal neurological deficits. Extremities: WWP, no drainign lesions, cap refill<3sec Skin: No rashes, lesions or ulcers Psychiatry: Judgement and insight appear normal. Mood & affect appropriate.     Data Reviewed: I have personally reviewed following labs and imaging studies  CBC: Recent Labs  Lab 04/26/19 1013 04/27/19 0526  WBC 20.6* 15.2*  NEUTROABS 18.2*  --   HGB 13.8 12.0  HCT 42.9 38.5  MCV 91.9 95.8  PLT 295 540   Basic Metabolic Panel: Recent Labs  Lab 04/26/19 1013 04/27/19 0525 04/27/19 0526  NA 138  --  140  K 3.8  --  3.7  CL 102  --  105  CO2 25  --  24  GLUCOSE 157*  --  103*  BUN 17  --  16  CREATININE 0.70  --  0.83  CALCIUM 10.2  --  8.4*  MG  --  1.9  --    GFR: Estimated Creatinine Clearance: 57.1 mL/min (by C-G formula based on SCr of 0.83 mg/dL). Liver Function Tests: Recent Labs  Lab 04/26/19 1013  AST 36  ALT 25  ALKPHOS 60  BILITOT 0.5  PROT 7.3  ALBUMIN 4.1   Recent Labs  Lab 04/26/19 1013  LIPASE 28   No results for input(s): AMMONIA in the last 168 hours. Coagulation Profile: No results for input(s): INR, PROTIME in the last 168 hours. Cardiac Enzymes: No results for input(s): CKTOTAL, CKMB, CKMBINDEX, TROPONINI in the last 168 hours. BNP (last 3 results) No results for input(s): PROBNP in the last 8760 hours. HbA1C: No results for input(s): HGBA1C in the last 72 hours. CBG: No results for input(s): GLUCAP in the last 168 hours. Lipid  Profile: No results for input(s): CHOL, HDL, LDLCALC, TRIG, CHOLHDL, LDLDIRECT in the last 72 hours. Thyroid Function Tests: No results for input(s): TSH, T4TOTAL, FREET4, T3FREE, THYROIDAB in the last 72 hours. Anemia Panel: No results for input(s): VITAMINB12, FOLATE, FERRITIN, TIBC, IRON, RETICCTPCT in the last 72 hours. Sepsis Labs: No results for input(s): PROCALCITON, LATICACIDVEN in the last 168 hours.  Recent Results (from the past 240 hour(s))  SARS Coronavirus 2 (Hosp order,Performed in [facility] lab via [employer] ID)     Status: None   Collection Time: 04/26/19  1:08 PM   Specimen: Dry Nasal Swab ([employer] ID Now)  Result Value Ref Range Status   SARS Coronavirus 2 ([employer] Now) NEGATIVE NEGATIVE Final    Comment: (NOTE) Interpretive Result Comment(s): COVID 19 Positive SARS CoV 2 target nucleic acids are DETECTED. The SARS CoV 2 RNA is generally detectable in upper and lower respiratory specimens during the acute phase of infection.  Positive results are indicative  of active infection with SARS CoV 2.  Clinical correlation with patient history and other diagnostic information is necessary to determine patient infection status.  Positive results do not rule out bacterial infection or coinfection with other viruses. The expected result is Negative. COVID 19 Negative SARS CoV 2 target nucleic acids are NOT DETECTED. The SARS CoV 2 RNA is generally detectable in upper and lower respiratory specimens during the acute phase of infection.  Negative results do not preclude SARS CoV 2 infection, do not rule out coinfections with other pathogens, and should not be used as the sole basis for treatment or other patient management decisions.  Negative results must be combined with clinical  observations, patient history, and epidemiological information. The expected result is Negative. Invalid Presence or absence of SARS CoV 2 nucleic acids cannot be determined. Repeat testing  was performed on the submitted specimen and repeated Invalid results were obtained.  If clinically indicated, additional testing on a new specimen with an alternate test methodology [phone]) is advised.  The SARS CoV 2 RNA is generally detectable in upper and lower respiratory specimens during the acute phase of infection. The expected result is Negative. Fact Sheet for Patients:  GolfingFamily.no Fact Sheet for Healthcare Providers: https://www.hernandez-brewer.com/ This test is not yet approved or cleared by the [place] FDA and has been authorized for detection and/or diagnosis of SARS CoV 2 by FDA under an Emergency Use Authorization (EUA).  This EUA will remain in effect (meaning this test can be used) for the duration of the COVID19 d eclaration under Section 564(b)(1) of the Act, 21 U.S.C. section 512 504 9244 3(b)(1), unless the authorization is terminated or revoked sooner. Performed at [facility], [address], [place], [place] [address]          Radiology Studies: Dg Chest 2 View  Result Date: 04/26/2019 CLINICAL DATA:  Shortness of breath EXAM: CHEST - 2 VIEW COMPARISON:  December 30, 2018 FINDINGS: There is no appreciable edema or consolidation. Heart size and pulmonary vascularity are normal. There is an aortic valve replacement. No adenopathy. No bone lesions. No pneumothorax. IMPRESSION: Status post aortic valve replacement. No edema or consolidation. Heart size normal. Electronically Signed   By: Lowella Grip III M.D.   On: 04/26/2019 11:35   Ct Abdomen Pelvis W Contrast  Result Date: 04/26/2019 CLINICAL DATA:  Right upper quadrant pain with nausea and vomiting beginning last night EXAM: CT ABDOMEN AND PELVIS WITH CONTRAST TECHNIQUE: Multidetector CT imaging of the abdomen and pelvis was performed using the standard protocol following bolus administration of intravenous contrast. CONTRAST:  197m OMNIPAQUE IOHEXOL 300  MG/ML  SOLN COMPARISON:  12/09/2018 FINDINGS: Lower chest: Transcatheter aortic valve replacement. No acute finding Hepatobiliary: No focal liver abnormality.Full gallbladder with heterogeneous mural enhancement and mild pericholecystic edema. Layering sludge or calculi seen towards the fundus. No bile duct dilatation Pancreas: Unremarkable. Spleen: Unremarkable. Adrenals/Urinary Tract: Negative adrenals. No hydronephrosis or stone. Renal sinus and right renal cortical cysts. Decompressed bladder which is essentially completely obscured by artifact from hip prostheses. Stomach/Bowel: Extensive distal colonic diverticulosis without acute inflammation. No pericecal inflammation. No bowel obstruction Vascular/Lymphatic: Atheromatous calcification. No acute vascular finding. No mass or adenopathy. Reproductive:Negative Other: No ascites or pneumoperitoneum. Musculoskeletal: Bilateral hip prosthesis. Bilateral sacral plasty. Lower lumbar laminectomy. Osteopenia and mild dextrocurvature of the lumbar spine. Remote left inferior pubic ramus fracture. IMPRESSION: Cholecystitis. Calcified gallstones are not visualized and ultrasound would be contributory. Electronically Signed   By: JNeva SeatD.  On: 04/26/2019 11:35   US Abdomen Limited Ruq  Result Date: 04/26/2019 CLINICAL DATA:  78 year old female with right upper quadrant pain CT demonstrates cholecystitis EXAM: ULTRASOUND ABDOMEN LIMITED RIGHT UPPER QUADRANT COMPARISON:  CT 04/26/2019 FINDINGS: Gallbladder: Sonographic Murphy's sign recorded as negative. Gallbladder wall thickening measuring 6 mm. Hyperechoic material with posterior shadowing layer dependently within the gallbladder lumen, compatible with recent CT. Trace pericholecystic fluid. Common bile duct: Diameter: 3 mm Liver: No focal lesion identified. Within normal limits in parenchymal echogenicity. Portal vein is patent on color Doppler imaging with normal direction of blood flow towards the  liver. IMPRESSION: Sonographic survey demonstrates cholelithiasis. The ultrasound study is equivocal for acute cholecystitis, as the sonographic Murphy's sign is negative, while there are suspicious findings of gallbladder wall thickening and pericholecystic fluid in the setting of cholelithiasis. If a confirmatory imaging test is needed for acute calculus cholecystitis, HIDA study would be the test of choice. Electronically Signed   By: Corrie Mckusick D.O.   On: 04/26/2019 12:20        Scheduled Meds:  metoprolol tartrate  5 mg Intravenous Q8H   Continuous Infusions:  sodium chloride Stopped (04/27/19 0418)   piperacillin-tazobactam (ZOSYN)  IV 3.375 g (04/27/19 1118)     LOS: 1 day    Time spent: 63 min     Nicolette Bang, MD [facility]  If 7PM-7AM, please contact night-coverage  04/27/2019, 11:18 AM

## 2019-04-28 ENCOUNTER — Encounter (HOSPITAL_COMMUNITY)
Admission: EM | Disposition: A | Discharge status: Home / Self Care | Payer: Self-pay | Source: Home / Self Care | Attending: Internal Medicine

## 2019-04-28 ENCOUNTER — Inpatient Hospital Stay (HOSPITAL_COMMUNITY): Payer: Medicare Other | Admitting: Anesthesiology

## 2019-04-28 HISTORY — PX: CHOLECYSTECTOMY: SHX55

## 2019-04-28 LAB — BASIC METABOLIC PANEL
Anion gap: 7 (ref 5–15)
BUN: 18 mg/dL (ref 8–23)
CO2: 23 mmol/L (ref 22–32)
Calcium: 7.3 mg/dL — ABNORMAL LOW (ref 8.9–10.3)
Chloride: 108 mmol/L (ref 98–111)
Creatinine, Ser: 0.94 mg/dL (ref 0.44–1.00)
GFR calc Af Amer: >60 mL/min (ref >60)
GFR calc non Af Amer: 58 mL/min — ABNORMAL LOW (ref >60)
Glucose, Bld: 98 mg/dL (ref 70–99)
Potassium: 3.8 mmol/L (ref 3.5–5.1)
Sodium: 138 mmol/L (ref 135–145)

## 2019-04-28 LAB — MAGNESIUM: Magnesium: 1.8 mg/dL (ref 1.7–2.4)

## 2019-04-28 LAB — TSH: TSH: 1.185 u[IU]/mL (ref 0.350–4.500)

## 2019-04-28 SURGERY — LAPAROSCOPIC CHOLECYSTECTOMY
Anesthesia: General | Site: Abdomen

## 2019-04-28 MED ORDER — ROCURONIUM BROMIDE 50 MG/5ML IV SOSY
PREFILLED_SYRINGE | INTRAVENOUS | Status: DC | PRN
  Administered 2019-04-28: 40 mg via INTRAVENOUS

## 2019-04-28 MED ORDER — ESMOLOL HCL 100 MG/10ML IV SOLN
INTRAVENOUS | Status: AC
  Filled 2019-04-28: qty 10

## 2019-04-28 MED ORDER — BUPIVACAINE-EPINEPHRINE (PF) 0.25% -1:200000 IJ SOLN
INTRAMUSCULAR | Status: DC | PRN
  Administered 2019-04-28: 10 mL

## 2019-04-28 MED ORDER — BUPIVACAINE-EPINEPHRINE (PF) 0.25% -1:200000 IJ SOLN
INTRAMUSCULAR | Status: AC
  Filled 2019-04-28: qty 30

## 2019-04-28 MED ORDER — DEXAMETHASONE SODIUM PHOSPHATE 10 MG/ML IJ SOLN
INTRAMUSCULAR | Status: AC
  Filled 2019-04-28: qty 1

## 2019-04-28 MED ORDER — 0.9 % SODIUM CHLORIDE (POUR BTL) OPTIME
TOPICAL | Status: DC | PRN
  Administered 2019-04-28: 1000 mL

## 2019-04-28 MED ORDER — ONDANSETRON HCL 4 MG/2ML IJ SOLN
INTRAMUSCULAR | Status: AC
  Filled 2019-04-28: qty 2

## 2019-04-28 MED ORDER — ONDANSETRON HCL 4 MG/2ML IJ SOLN: 4.0000 mg | Freq: Once | INTRAMUSCULAR | Status: DC | PRN

## 2019-04-28 MED ORDER — METOPROLOL TARTRATE 5 MG/5ML IV SOLN
2.5000 mg | Freq: Three times a day (TID) | INTRAVENOUS | Status: DC
  Administered 2019-04-28 – 2019-04-29 (×2): 2.5 mg via INTRAVENOUS
  Filled 2019-04-28 (×2): qty 5

## 2019-04-28 MED ORDER — ACETAMINOPHEN 325 MG PO TABS: 325.0000 mg | ORAL_TABLET | ORAL | Status: DC | PRN

## 2019-04-28 MED ORDER — SODIUM CHLORIDE 0.9 % IV SOLN
INTRAVENOUS | Status: DC
  Administered 2019-04-29: 02:00:00 via INTRAVENOUS

## 2019-04-28 MED ORDER — LACTATED RINGERS IV SOLN
INTRAVENOUS | Status: DC
  Administered 2019-04-28: 11:00:00 via INTRAVENOUS

## 2019-04-28 MED ORDER — ONDANSETRON HCL 4 MG/2ML IJ SOLN
INTRAMUSCULAR | Status: DC | PRN
  Administered 2019-04-28: 4 mg via INTRAVENOUS

## 2019-04-28 MED ORDER — LIDOCAINE 2% (20 MG/ML) 5 ML SYRINGE
INTRAMUSCULAR | Status: AC
  Filled 2019-04-28: qty 5

## 2019-04-28 MED ORDER — MEPERIDINE HCL 50 MG/ML IJ SOLN: 6.2500 mg | INTRAMUSCULAR | Status: DC | PRN

## 2019-04-28 MED ORDER — PROPOFOL 10 MG/ML IV BOLUS
INTRAVENOUS | Status: AC
  Filled 2019-04-28: qty 20

## 2019-04-28 MED ORDER — OXYCODONE HCL 5 MG/5ML PO SOLN: 5.0000 mg | Freq: Once | ORAL | Status: DC | PRN

## 2019-04-28 MED ORDER — LACTATED RINGERS IR SOLN
Status: DC | PRN
  Administered 2019-04-28: 2000 mL

## 2019-04-28 MED ORDER — LACTATED RINGERS IR SOLN: Status: DC | PRN

## 2019-04-28 MED ORDER — PIPERACILLIN-TAZOBACTAM 3.375 G IVPB 30 MIN
3.3750 g | INTRAVENOUS | Status: AC
  Administered 2019-04-28: 3.375 g via INTRAVENOUS

## 2019-04-28 MED ORDER — SUCCINYLCHOLINE CHLORIDE 200 MG/10ML IV SOSY
PREFILLED_SYRINGE | INTRAVENOUS | Status: AC
  Filled 2019-04-28: qty 10

## 2019-04-28 MED ORDER — SUGAMMADEX SODIUM 200 MG/2ML IV SOLN
INTRAVENOUS | Status: DC | PRN
  Administered 2019-04-28: 200 mg via INTRAVENOUS

## 2019-04-28 MED ORDER — ESMOLOL HCL 100 MG/10ML IV SOLN
INTRAVENOUS | Status: DC | PRN
  Administered 2019-04-28: 20 mg via INTRAVENOUS

## 2019-04-28 MED ORDER — LIDOCAINE HCL (PF) 1 % IJ SOLN
INTRAMUSCULAR | Status: DC | PRN
  Administered 2019-04-28: 10 mL

## 2019-04-28 MED ORDER — PIPERACILLIN-TAZOBACTAM 3.375 G IVPB
3.3750 g | Freq: Three times a day (TID) | INTRAVENOUS | Status: AC
  Administered 2019-04-28 – 2019-04-29 (×2): 3.375 g via INTRAVENOUS
  Filled 2019-04-28 (×2): qty 50

## 2019-04-28 MED ORDER — FENTANYL CITRATE (PF) 100 MCG/2ML IJ SOLN
INTRAMUSCULAR | Status: DC | PRN
  Administered 2019-04-28: 25 ug via INTRAVENOUS
  Administered 2019-04-28: 50 ug via INTRAVENOUS
  Administered 2019-04-28: 25 ug via INTRAVENOUS

## 2019-04-28 MED ORDER — TRAMADOL HCL 50 MG PO TABS: 50.0000 mg | ORAL_TABLET | Freq: Four times a day (QID) | ORAL | Status: DC | PRN

## 2019-04-28 MED ORDER — LIDOCAINE HCL (PF) 1 % IJ SOLN
INTRAMUSCULAR | Status: AC
  Filled 2019-04-28: qty 30

## 2019-04-28 MED ORDER — FENTANYL CITRATE (PF) 100 MCG/2ML IJ SOLN
INTRAMUSCULAR | Status: AC
  Filled 2019-04-28: qty 2

## 2019-04-28 MED ORDER — ROCURONIUM BROMIDE 10 MG/ML (PF) SYRINGE
PREFILLED_SYRINGE | INTRAVENOUS | Status: AC
  Filled 2019-04-28: qty 10

## 2019-04-28 MED ORDER — EPHEDRINE 5 MG/ML INJ
INTRAVENOUS | Status: AC
  Filled 2019-04-28: qty 10

## 2019-04-28 MED ORDER — LIDOCAINE 2% (20 MG/ML) 5 ML SYRINGE
INTRAMUSCULAR | Status: DC | PRN
  Administered 2019-04-28: 50 mg via INTRAVENOUS

## 2019-04-28 MED ORDER — DEXAMETHASONE SODIUM PHOSPHATE 10 MG/ML IJ SOLN
INTRAMUSCULAR | Status: DC | PRN
  Administered 2019-04-28: 10 mg via INTRAVENOUS

## 2019-04-28 MED ORDER — OXYCODONE HCL 5 MG PO TABS: 5.0000 mg | ORAL_TABLET | Freq: Once | ORAL | Status: DC | PRN

## 2019-04-28 MED ORDER — EPHEDRINE SULFATE-NACL 50-0.9 MG/10ML-% IV SOSY
PREFILLED_SYRINGE | INTRAVENOUS | Status: DC | PRN
  Administered 2019-04-28: 5 mg via INTRAVENOUS

## 2019-04-28 MED ORDER — FENTANYL CITRATE (PF) 100 MCG/2ML IJ SOLN
25.0000 ug | INTRAMUSCULAR | Status: DC | PRN
  Administered 2019-04-28 (×2): 50 ug via INTRAVENOUS

## 2019-04-28 MED ORDER — ACETAMINOPHEN 160 MG/5ML PO SOLN: 325.0000 mg | ORAL | Status: DC | PRN

## 2019-04-28 MED ORDER — PHENYLEPHRINE 40 MCG/ML (10ML) SYRINGE FOR IV PUSH (FOR BLOOD PRESSURE SUPPORT)
PREFILLED_SYRINGE | INTRAVENOUS | Status: DC | PRN
  Administered 2019-04-28: 40 ug via INTRAVENOUS
  Administered 2019-04-28: 80 ug via INTRAVENOUS
  Administered 2019-04-28: 40 ug via INTRAVENOUS

## 2019-04-28 MED ORDER — PHENYLEPHRINE 40 MCG/ML (10ML) SYRINGE FOR IV PUSH (FOR BLOOD PRESSURE SUPPORT)
PREFILLED_SYRINGE | INTRAVENOUS | Status: AC
  Filled 2019-04-28: qty 10

## 2019-04-28 MED ORDER — SUCCINYLCHOLINE CHLORIDE 200 MG/10ML IV SOSY
PREFILLED_SYRINGE | INTRAVENOUS | Status: DC | PRN
  Administered 2019-04-28: 80 mg via INTRAVENOUS

## 2019-04-28 MED ORDER — SUGAMMADEX SODIUM 200 MG/2ML IV SOLN
INTRAVENOUS | Status: AC
  Filled 2019-04-28: qty 2

## 2019-04-28 MED ORDER — FENTANYL CITRATE (PF) 250 MCG/5ML IJ SOLN
INTRAMUSCULAR | Status: AC
  Filled 2019-04-28: qty 5

## 2019-04-28 MED ORDER — PROPOFOL 10 MG/ML IV BOLUS
INTRAVENOUS | Status: DC | PRN
  Administered 2019-04-28: 80 mg via INTRAVENOUS

## 2019-04-28 SURGICAL SUPPLY — 40 items
APPLIER CLIP ROT 10 11.4 M/L (STAPLE) ×3
CHLORAPREP W/TINT 26 (MISCELLANEOUS) ×3 IMPLANT
CLIP APPLIE ROT 10 11.4 M/L (STAPLE) ×1 IMPLANT
CLIP VESOLOCK MED LG 6/CT (CLIP) IMPLANT
COVER MAYO STAND STRL (DRAPES) IMPLANT
COVER SURGICAL LIGHT HANDLE (MISCELLANEOUS) ×3 IMPLANT
COVER WAND RF STERILE (DRAPES) ×2 IMPLANT
DECANTER SPIKE VIAL GLASS SM (MISCELLANEOUS) ×3 IMPLANT
DERMABOND ADVANCED (GAUZE/BANDAGES/DRESSINGS) ×2
DERMABOND ADVANCED .7 DNX12 (GAUZE/BANDAGES/DRESSINGS) IMPLANT
DRAPE C-ARM 42X120 X-RAY (DRAPES) IMPLANT
ELECT L-HOOK LAP 45CM DISP (ELECTROSURGICAL)
ELECT PENCIL ROCKER SW 15FT (MISCELLANEOUS) ×3 IMPLANT
ELECT REM PT RETURN 15FT ADLT (MISCELLANEOUS) ×3 IMPLANT
ELECTRODE L-HOOK LAP 45CM DISP (ELECTROSURGICAL) IMPLANT
GLOVE BIO SURGEON STRL SZ 6 (GLOVE) ×3 IMPLANT
GLOVE INDICATOR 6.5 STRL GRN (GLOVE) ×3 IMPLANT
GOWN STRL REUS W/TWL 2XL LVL3 (GOWN DISPOSABLE) ×3 IMPLANT
GOWN STRL REUS W/TWL XL LVL3 (GOWN DISPOSABLE) ×6 IMPLANT
HEMOSTAT SNOW SURGICEL 2X4 (HEMOSTASIS) ×2 IMPLANT
KIT BASIN OR (CUSTOM PROCEDURE TRAY) ×3 IMPLANT
KIT TURNOVER KIT A (KITS) ×2 IMPLANT
L-HOOK LAP DISP 36CM (ELECTROSURGICAL) ×3
LHOOK LAP DISP 36CM (ELECTROSURGICAL) IMPLANT
PAD POSITIONING PINK XL (MISCELLANEOUS) IMPLANT
POUCH SPECIMEN RETRIEVAL 10MM (ENDOMECHANICALS) IMPLANT
PROTECTOR NERVE ULNAR (MISCELLANEOUS) IMPLANT
SCISSORS LAP 5X35 DISP (ENDOMECHANICALS) ×3 IMPLANT
SET CHOLANGIOGRAPH MIX (MISCELLANEOUS) IMPLANT
SET IRRIG TUBING LAPAROSCOPIC (IRRIGATION / IRRIGATOR) ×3 IMPLANT
SET TUBE SMOKE EVAC HIGH FLOW (TUBING) ×2 IMPLANT
SLEEVE XCEL OPT CAN 5 100 (ENDOMECHANICALS) ×3 IMPLANT
SUT MNCRL AB 4-0 PS2 18 (SUTURE) ×3 IMPLANT
TAPE CLOTH 4X10 WHT NS (GAUZE/BANDAGES/DRESSINGS) IMPLANT
TOWEL OR 17X26 10 PK STRL BLUE (TOWEL DISPOSABLE) ×3 IMPLANT
TOWEL OR NON WOVEN STRL DISP B (DISPOSABLE) ×3 IMPLANT
TRAY LAPAROSCOPIC (CUSTOM PROCEDURE TRAY) ×3 IMPLANT
TROCAR BLADELESS OPT 5 100 (ENDOMECHANICALS) ×3 IMPLANT
TROCAR XCEL BLUNT TIP 100MML (ENDOMECHANICALS) ×3 IMPLANT
TROCAR XCEL NON-BLD 11X100MML (ENDOMECHANICALS) ×3 IMPLANT

## 2019-04-28 NOTE — Anesthesia Procedure Notes (Signed)
Procedure Name: Intubation Date/Time: 04/28/2019 12:30 PM Performed by: Maxwell Caul, CRNA Pre-anesthesia Checklist: Patient identified, Emergency Drugs available, Suction available and Patient being monitored Patient Re-evaluated:Patient Re-evaluated prior to induction Oxygen Delivery Method: Circle system utilized Preoxygenation: Pre-oxygenation with 100% oxygen Induction Type: IV induction Laryngoscope Size: Mac and 3 Grade View: Grade I Tube type: Oral Tube size: 7.0 mm Number of attempts: 1 Airway Equipment and Method: Stylet Placement Confirmation: ETT inserted through vocal cords under direct vision,  positive ETCO2 and breath sounds checked- equal and bilateral Secured at: 21 cm Tube secured with: Tape Dental Injury: Teeth and Oropharynx as per pre-operative assessment

## 2019-04-28 NOTE — Progress Notes (Signed)
CRITICAL VALUE ALERT  Critical Value:  Staph Aureus on MRSA PCR  Date & Time Notied:  04/27/2019  Provider Notified: Lamar Blinks NP  Orders Received/Actions taken: standing orders initiated

## 2019-04-28 NOTE — Anesthesia Postprocedure Evaluation (Signed)
Anesthesia Post Note  Patient: Dominique [name]  Procedure(s) Performed: LAPAROSCOPIC CHOLECYSTECTOMY (N/A Abdomen)     Patient location during evaluation: PACU Anesthesia Type: General Level of consciousness: awake and alert Pain management: pain level controlled Vital Signs Assessment: post-procedure vital signs reviewed and stable Respiratory status: spontaneous breathing, nonlabored ventilation, respiratory function stable and patient connected to nasal cannula oxygen Cardiovascular status: blood pressure returned to baseline and stable Postop Assessment: no apparent nausea or vomiting Anesthetic complications: no    Last Vitals:  Vitals:   04/28/19 1415 04/28/19 1436  BP:  (!) 110/59  Pulse:  71  Resp: 15 18  Temp:  (!) 36.3 C  SpO2:  98%    Last Pain:  Vitals:   04/28/19 1436  TempSrc: Oral  PainSc:                  Gayathri Futrell

## 2019-04-28 NOTE — Progress Notes (Signed)
PROGRESS NOTE    Dominique [name]  OMV:[id number] DOB: [birth year] DOA: 04/26/2019 PCP: Colon Branch, MD   Brief Narrative:  Per admitting MD: Hassell Halim Boardmanis a 78 y.o.femalewith medical history significant foraortic valve disease status post TAVR March 3 on Plavix last dose Saturday, June 27 as well ashypertension, hyperlipidemia, COPD, and history of nonsustained V. Tach. Patient presented to [facility] with reported abdominal pain that came on yesterday at 8:30 PM. She reported right upper quadrant abdominal pain spreading to generalized abdominal pain with nausea and vomiting till approximately 3 AM. Patient reports it self resolved and she went to sleep and was still feeling poorly. She woke up with persistent abdominal pain again vomiting. She presented to [facility] for further evaluation. There patient and work-up revealing pericholecystic edema with a normal common bile duct. Because of presentation consistent with an acute cholecystitis picture the case was discussed with general surgery because of medical problems as above they requested hospitalist be contacted. They in turn admitted the patient's for further eval and treatment.   ED Course:BMP normal for CO2 25 BUN and creatinine 17 and 0.7 AST and ALT of 36 and 25 with a total bili of 0.5 and alk phos of 60. She did have a's white count of 21,000 with a negative UA. CT of abdomen pelvis was concerning for cholecystitis with a normal common bile duct. Of note patient was reported to have atrial fibrillation at outside facility but converted to a normal sinus rhythm there. She also received Zosyn x1. The case discussed with general surgery because of complex medical problems are requested hospitalist to admit as above   Assessment & Plan:   Principal Problem:   Acute cholecystitis Active Problems:   Essential hypertension   Hyperlipidemia   COPD GOLD II   CAD (coronary artery disease),  native coronary artery   NSVT (nonsustained ventricular tachycardia) (HCC)   Paroxysmal atrial fibrillation (HCC)   Acute cholecystitis. cont n.p.o., hold ASA/Plavix last dose June 27 AM, as noted-discussed the case with cardiology on-call who agreed with holding for surgery, general surgery consult appreciated. LAP CHOLE LATER THIS AFTERNOON, c/w zosyn 2/2 significant wbc on admission-day 3, Analgesics as needed  Aortic stenosis status post TAVR. As above discussed the case with cardiology on-call. holding plavix, further recs per cards  New  Atrial fib: c/w bb iv while npo for lap chole, bb when po resumes, cards with no recommendation for full anticoag currently, home med regimen PTA  ASA/PLAVIX  Hypertension continue patient's home medications without change.  Lipidemia. Patient can resume statin on discharge  COPD patient has a 50-pack-year history of smoking although none in 15 years she is not in acute exacerbation continue home medications.   DVT prophylaxis: SCD/Compression stockings  Code Status: full    Code Status Orders  (From admission, onward)         Start     Ordered   04/26/19 1802  Full code  Continuous     04/26/19 1801        Code Status History    Date Active Date Inactive Code Status Order ID Comments User Context   12/30/2018 1326 01/01/2019 1529 Full Code [id number]  Crista Luria Inpatient   11/27/2018 7654 11/27/2018 1408 Full Code [id number]  Sherren Mocha, MD Inpatient   Advance Care Planning Activity    Advance Directive Documentation     Most Recent Value  Type of Advance Directive  Healthcare  Power of Attorney, Living will  Pre-existing out of facility DNR order (yellow form or pink MOST form)  --  "MOST" Form in Place?  --     Family Communication: lm with son Disposition Plan:   Patient will remain inpatient requiring continued expert subspecialty consultation with cardiology and gen surgery, discontinuation of  anticoagulation, telemetry, fluid and electrolyte monitoring. Without these treatments and evaluations patient risk of life-threatening clinical deterioration   Consults called: None Admission status: Inpatient   Consultants:   cardiology and gen surgery  Procedures:  Dg Chest 2 View  Result Date: 04/26/2019 CLINICAL DATA:  Shortness of breath EXAM: CHEST - 2 VIEW COMPARISON:  December 30, 2018 FINDINGS: There is no appreciable edema or consolidation. Heart size and pulmonary vascularity are normal. There is an aortic valve replacement. No adenopathy. No bone lesions. No pneumothorax. IMPRESSION: Status post aortic valve replacement. No edema or consolidation. Heart size normal. Electronically Signed   By: Lowella Grip III M.D.   On: 04/26/2019 11:35   Ct Abdomen Pelvis W Contrast  Result Date: 04/26/2019 CLINICAL DATA:  Right upper quadrant pain with nausea and vomiting beginning last night EXAM: CT ABDOMEN AND PELVIS WITH CONTRAST TECHNIQUE: Multidetector CT imaging of the abdomen and pelvis was performed using the standard protocol following bolus administration of intravenous contrast. CONTRAST:  137m OMNIPAQUE IOHEXOL 300 MG/ML  SOLN COMPARISON:  12/09/2018 FINDINGS: Lower chest: Transcatheter aortic valve replacement. No acute finding Hepatobiliary: No focal liver abnormality.Full gallbladder with heterogeneous mural enhancement and mild pericholecystic edema. Layering sludge or calculi seen towards the fundus. No bile duct dilatation Pancreas: Unremarkable. Spleen: Unremarkable. Adrenals/Urinary Tract: Negative adrenals. No hydronephrosis or stone. Renal sinus and right renal cortical cysts. Decompressed bladder which is essentially completely obscured by artifact from hip prostheses. Stomach/Bowel: Extensive distal colonic diverticulosis without acute inflammation. No pericecal inflammation. No bowel obstruction Vascular/Lymphatic: Atheromatous calcification. No acute vascular finding. No  mass or adenopathy. Reproductive:Negative Other: No ascites or pneumoperitoneum. Musculoskeletal: Bilateral hip prosthesis. Bilateral sacral plasty. Lower lumbar laminectomy. Osteopenia and mild dextrocurvature of the lumbar spine. Remote left inferior pubic ramus fracture. IMPRESSION: Cholecystitis. Calcified gallstones are not visualized and ultrasound would be contributory. Electronically Signed   By: JMonte FantasiaM.D.   On: 04/26/2019 11:35   [place]domen Limited Ruq  Result Date: 04/26/2019 CLINICAL DATA:  78year old female with right upper quadrant pain CT demonstrates cholecystitis EXAM: ULTRASOUND ABDOMEN LIMITED RIGHT UPPER QUADRANT COMPARISON:  CT 04/26/2019 FINDINGS: Gallbladder: Sonographic Murphy's sign recorded as negative. Gallbladder wall thickening measuring 6 mm. Hyperechoic material with posterior shadowing layer dependently within the gallbladder lumen, compatible with recent CT. Trace pericholecystic fluid. Common bile duct: Diameter: 3 mm Liver: No focal lesion identified. Within normal limits in parenchymal echogenicity. Portal vein is patent on color Doppler imaging with normal direction of blood flow towards the liver. IMPRESSION: Sonographic survey demonstrates cholelithiasis. The ultrasound study is equivocal for acute cholecystitis, as the sonographic Murphy's sign is negative, while there are suspicious findings of gallbladder wall thickening and pericholecystic fluid in the setting of cholelithiasis. If a confirmatory imaging test is needed for acute calculus cholecystitis, HIDA study would be the test of choice. Electronically Signed   By: JCorrie MckusickD.O.   On: 04/26/2019 12:20     Antimicrobials:   Zosyn day 3    Subjective: No acute events overnight, patient reports pain well controlled Remains n.p.o. for anticipated lap chole today  Objective: Vitals:   04/27/19 2134 04/27/19 2136 04/27/19 2258 04/28/19  0516  BP: (!) 89/42 (!) 82/40 108/79 (!) 106/51    Pulse: 65  65 68  Resp: 14   18  Temp: 98.4 F (36.9 C)   98.7 F (37.1 C)  TempSrc: Oral   Oral  SpO2: 99%   96%  Weight:      Height:        Intake/Output Summary (Last 24 hours) at 04/28/2019 1134 Last data filed at 04/28/2019 0600 Gross per 24 hour  Intake 2265.39 ml  Output --  Net 2265.39 ml   Filed Weights   04/26/19 1721  Weight: 72.7 kg    Examination:  General exam: Appears calm and comfortable  Respiratory system: Clear to auscultation. Respiratory effort normal. Cardiovascular system: S1 & S2 heard, RRR. No JVD, murmurs, rubs, gallops or clicks. No pedal edema. Gastrointestinal system: Abdomen is nondistended, soft and mildly tender. No organomegaly or masses felt. Normal bowel sounds heard. Central nervous system: Alert and oriented. No focal neurological deficits. Extremities: warm and well perfused, no contractures Skin: No rashes, lesions or ulcers Psychiatry: Judgement and insight appear normal. Mood & affect appropriate.     Data Reviewed: I have personally reviewed following labs and imaging studies  CBC: Recent Labs  Lab 04/26/19 1013 04/27/19 0526  WBC 20.6* 15.2*  NEUTROABS 18.2*  --   HGB 13.8 12.0  HCT 42.9 38.5  MCV 91.9 95.8  PLT 295 161   Basic Metabolic Panel: Recent Labs  Lab 04/26/19 1013 04/27/19 0525 04/27/19 0526 04/28/19 0510  NA 138  --  140 138  K 3.8  --  3.7 3.8  CL 102  --  105 108  CO2 25  --  24 23  GLUCOSE 157*  --  103* 98  BUN 17  --  16 18  CREATININE 0.70  --  0.83 0.94  CALCIUM 10.2  --  8.4* 7.3*  MG  --  1.9  --  1.8   GFR: Estimated Creatinine Clearance: 49.6 mL/min (by C-G formula based on SCr of 0.94 mg/dL). Liver Function Tests: Recent Labs  Lab 04/26/19 1013  AST 36  ALT 25  ALKPHOS 60  BILITOT 0.5  PROT 7.3  ALBUMIN 4.1   Recent Labs  Lab 04/26/19 1013  LIPASE 28   No results for input(s): AMMONIA in the last 168 hours. Coagulation Profile: No results for input(s): INR,  PROTIME in the last 168 hours. Cardiac Enzymes: No results for input(s): CKTOTAL, CKMB, CKMBINDEX, TROPONINI in the last 168 hours. BNP (last 3 results) No results for input(s): PROBNP in the last 8760 hours. HbA1C: No results for input(s): HGBA1C in the last 72 hours. CBG: No results for input(s): GLUCAP in the last 168 hours. Lipid Profile: No results for input(s): CHOL, HDL, LDLCALC, TRIG, CHOLHDL, LDLDIRECT in the last 72 hours. Thyroid Function Tests: Recent Labs    04/28/19 0510  TSH 1.185   Anemia Panel: No results for input(s): VITAMINB12, FOLATE, FERRITIN, TIBC, IRON, RETICCTPCT in the last 72 hours. Sepsis Labs: No results for input(s): PROCALCITON, LATICACIDVEN in the last 168 hours.  Recent Results (from the past 240 hour(s))  SARS Coronavirus 2 (Hosp order,Performed in [facility] lab via [employer] ID)     Status: None   Collection Time: 04/26/19  1:08 PM   Specimen: Dry Nasal Swab (Abbott ID Now)  Result Value Ref Range Status   SARS Coronavirus 2 (Abbott ID Now) NEGATIVE NEGATIVE Final    Comment: (NOTE) Interpretive Result Comment(s): COVID 19 Positive  SARS CoV 2 target nucleic acids are DETECTED. The SARS CoV 2 RNA is generally detectable in upper and lower respiratory specimens during the acute phase of infection.  Positive results are indicative of active infection with SARS CoV 2.  Clinical correlation with patient history and other diagnostic information is necessary to determine patient infection status.  Positive results do not rule out bacterial infection or coinfection with other viruses. The expected result is Negative. COVID 19 Negative SARS CoV 2 target nucleic acids are NOT DETECTED. The SARS CoV 2 RNA is generally detectable in upper and lower respiratory specimens during the acute phase of infection.  Negative results do not preclude SARS CoV 2 infection, do not rule out coinfections with other pathogens, and should not be used as the  sole basis for treatment or other patient management decisions.  Negative results must be combined with clinical  observations, patient history, and epidemiological information. The expected result is Negative. Invalid Presence or absence of SARS CoV 2 nucleic acids cannot be determined. Repeat testing was performed on the submitted specimen and repeated Invalid results were obtained.  If clinically indicated, additional testing on a new specimen with an alternate test methodology [phone]) is advised.  The SARS CoV 2 RNA is generally detectable in upper and lower respiratory specimens during the acute phase of infection. The expected result is Negative. Fact Sheet for Patients:  GolfingFamily.no Fact Sheet for Healthcare Providers: https://www.hernandez-brewer.com/ This test is not yet approved or cleared by the [place] FDA and has been authorized for detection and/or diagnosis of SARS CoV 2 by FDA under an Emergency Use Authorization (EUA).  This EUA will remain in effect (meaning this test can be used) for the duration of the COVID19 d eclaration under Section 564(b)(1) of the Act, 21 U.S.C. section 6142699078 3(b)(1), unless the authorization is terminated or revoked sooner. Performed at [facility], [address], [place], [place] [address]   Surgical PCR screen     Status: Abnormal   Collection Time: 04/27/19  7:55 PM   Specimen: Nasal Mucosa; Nasal Swab  Result Value Ref Range Status   MRSA, PCR NEGATIVE NEGATIVE Final   Staphylococcus aureus POSITIVE (A) NEGATIVE Final    Comment: (NOTE) The Xpert SA Assay (FDA approved for NASAL specimens in patients 56 years of age and older), is one component of a comprehensive surveillance program. It is not intended to diagnose infection nor to guide or monitor treatment. Performed at [facility], [place] [address], [place], [place] [address]           Radiology Studies: US Abdomen Limited Ruq  Result Date: 04/26/2019 CLINICAL DATA:  78 year old female with right upper quadrant pain CT demonstrates cholecystitis EXAM: ULTRASOUND ABDOMEN LIMITED RIGHT UPPER QUADRANT COMPARISON:  CT 04/26/2019 FINDINGS: Gallbladder: Sonographic Murphy's sign recorded as negative. Gallbladder wall thickening measuring 6 mm. Hyperechoic material with posterior shadowing layer dependently within the gallbladder lumen, compatible with recent CT. Trace pericholecystic fluid. Common bile duct: Diameter: 3 mm Liver: No focal lesion identified. Within normal limits in parenchymal echogenicity. Portal vein is patent on color Doppler imaging with normal direction of blood flow towards the liver. IMPRESSION: Sonographic survey demonstrates cholelithiasis. The ultrasound study is equivocal for acute cholecystitis, as the sonographic Murphy's sign is negative, while there are suspicious findings of gallbladder wall thickening and pericholecystic fluid in the setting of cholelithiasis. If a confirmatory imaging test is needed for acute calculus cholecystitis, HIDA study would be the test  of choice. Electronically Signed   By: Corrie Mckusick D.O.   On: 04/26/2019 12:20        Scheduled Meds:  [MAR Hold] Chlorhexidine Gluconate Cloth  6 each Topical Daily   [MAR Hold] metoprolol tartrate  2.5 mg Intravenous Q8H   [MAR Hold] mupirocin ointment  1 application Nasal BID   Continuous Infusions:  sodium chloride 100 mL/hr at 04/28/19 1022   lactated ringers 20 mL/hr at 04/28/19 1129   [MAR Hold] piperacillin-tazobactam (ZOSYN)  IV 12.5 mL/hr at 04/28/19 0600     LOS: 2 days    Time spent: 35 min    Nicolette Bang, MD [facility]  If 7PM-7AM, please contact night-coverage  04/28/2019, 11:34 AM

## 2019-04-28 NOTE — Op Note (Signed)
Laparoscopic Cholecystectomy  Indications: This patient presents with acute cholecystitis and will undergo laparoscopic cholecystectomy.  Pre-operative Diagnosis: acute cholecystitis  Post-operative Diagnosis: Same  Surgeon: Stark Klein   Assistants: Romana Juniper, MD  Anesthesia: General endotracheal anesthesia and local  ASA Class: 3  Procedure Details  The patient was seen again in the Holding Room. The risks, benefits, complications, treatment options, and expected outcomes were discussed with the patient. The possibilities of  bleeding, recurrent infection, damage to nearby structures, the need for additional procedures, failure to diagnose a condition, the possible need to convert to an open procedure, and creating a complication requiring transfusion or operation were discussed with the patient. The likelihood of improving the patient's symptoms with return to their baseline status is good.    The patient and/or family concurred with the proposed plan, giving informed consent. The site of surgery properly noted. The patient was taken to Operating Room, and the procedure verified as Laparoscopic Cholecystectomy with possible Intraoperative Cholangiogram. A Time Out was held and the above information confirmed.  Prior to the induction of general anesthesia, antibiotic prophylaxis was administered. General endotracheal anesthesia was then administered and tolerated well. After the induction, the abdomen was prepped with Chloraprep and draped in the sterile fashion. The patient was positioned in the supine position.  Local anesthetic agent was injected into the skin near the umbilicus and an incision made. We dissected down to the abdominal fascia with blunt dissection.  The fascia was incised vertically and we entered the peritoneal cavity bluntly.  A pursestring suture of 0-Vicryl was placed around the fascial opening.  The Hasson cannula was inserted and secured with the stay suture.   Pneumoperitoneum was then created with CO2 and tolerated well without any adverse changes in the patient's vital signs. An 11-mm port was placed in the subxiphoid position.  Two 5-mm ports were placed in the right upper quadrant. All skin incisions were infiltrated with a local anesthetic agent before making the incision and placing the trocars.   We positioned the patient in reverse Trendelenburg, tilted slightly to the patient's left.  The gallbladder was identified and noted to be distended and partially necrotic.  The nezhat suction was used to aspirate the gallbladder.  The fundus was grasped and retracted cephalad, but the wall was very weak and there was spillage of stones. Adhesions were lysed bluntly and with the electrocautery where indicated, taking care not to injure any adjacent organs or viscus. The infundibulum was grasped and retracted laterally, exposing the peritoneum overlying the triangle of Calot. This was then divided and exposed in a blunt fashion.   The cystic duct and cystic artery was clearly identified and bluntly dissected circumferentially.  A critical view of the cystic duct and cystic artery was obtained.  These were identified, dissected free, ligated with clips and divided.  The gallbladder was dissected from the liver bed in retrograde fashion with the electrocautery. The gallbladder was removed and placed in an Endocatch bag.  The gallbladder and Endocatch bag were then removed through the umbilical port site.  The liver bed was irrigated and inspected. Hemostasis was achieved with the electrocautery. Copious irrigation was utilized and was repeatedly aspirated until clear. The large suction was used and care was taken to search for any retained stones prior to stopping.     We again inspected the right upper quadrant for hemostasis.  Because of her plavix, a piece of SNOW hemostatic agent was placed in the gallbladder fossa.  Pneumoperitoneum was released  as we removed the  trocars.   The pursestring suture was used to close the umbilical fascia.  4-0 Monocryl was used to close the skin.   The skin was cleaned and dry, and Dermabond was applied. The patient was then extubated and brought to the recovery room in stable condition. Instrument, sponge, and needle counts were correct at closure and at the conclusion of the case.   Findings: Distended and partially necrotic gallbladder.    Estimated Blood Loss: 50 mL         Drains: none          Specimens: Gallbladder to pathology       Complications: None; patient tolerated the procedure well.         Disposition: PACU - hemodynamically stable.         Condition: stable

## 2019-04-28 NOTE — Transfer of Care (Signed)
Immediate Anesthesia Transfer of Care Note  Patient: Dominique [name]  Procedure(s) Performed: LAPAROSCOPIC CHOLECYSTECTOMY (N/A Abdomen)  Patient Location: PACU  Anesthesia Type:General  Level of Consciousness: awake, alert  and oriented  Airway & Oxygen Therapy: Patient Spontanous Breathing and Patient connected to face mask oxygen  Post-op Assessment: Report given to RN and Post -op Vital signs reviewed and stable  Post vital signs: Reviewed and stable  Last Vitals:  Vitals Value Taken Time  BP 118/50 04/28/19 1338  Temp    Pulse 76 04/28/19 1339  Resp 19 04/28/19 1339  SpO2 100 % 04/28/19 1339  Vitals shown include unvalidated device data.  Last Pain:  Vitals:   04/28/19 0840  TempSrc:   PainSc: 0-No pain      Patients Stated Pain Goal: 0 (45/99/77 4142)  Complications: No apparent anesthesia complications

## 2019-04-28 NOTE — Progress Notes (Signed)
   Patient planned for OR this AM. Telemetry reviewed, NSR without recurrent AF. BP has been on the softer side, requiring holding of IV metoprolol. Will decrease dose to 2.5mg  q8hr with hold parameters. Home benazepril and amlodipine remain on hold. Will follow from a distance.  Giancarlos Berendt PA-C

## 2019-04-28 NOTE — Anesthesia Preprocedure Evaluation (Addendum)
Anesthesia Evaluation  Patient identified by MRN, date of birth, ID band Patient awake    Reviewed: Allergy & Precautions, NPO status , Patient's Chart, lab work & pertinent test results  Airway Mallampati: II  TM Distance: >3 FB Neck ROM: Full    Dental no notable dental hx.    Pulmonary COPD,  COPD inhaler, former smoker,    Pulmonary exam normal breath sounds clear to auscultation       Cardiovascular hypertension, Pt. on medications Normal cardiovascular exam Rhythm:Regular Rate:Normal  Pulmonary HTN   Pulmonary nodules  ECHO   1. The left ventricle has normal systolic function with an ejection fraction of 60-65%. The cavity size was normal. Left ventricular diastolic parameters were normal.  2. The right ventricle has normal systolic function. The cavity was normal. There is no increase in right ventricular wall thickness.  3. No evidence present in the left atrial appendage.  4. Mild thickening of the mitral valve leaflet.  5. Post TAVR with 26 mm Medtronic Core Valve Mean gradient low and stable since echo done 12/31/18 and no significant PVL.  6. No pulmonic valve vegetation visualized.   Neuro/Psych negative neurological ROS     GI/Hepatic negative GI ROS, Neg liver ROS,   Endo/Other  negative endocrine ROS  Renal/GU negative Renal ROS     Musculoskeletal negative musculoskeletal ROS (+)   Abdominal   Peds  Hematology  (+) Blood dyscrasia, anemia , HLD   Anesthesia Other Findings Left leg ischemic  Reproductive/Obstetrics                            Anesthesia Physical  Anesthesia Plan  ASA: III  Anesthesia Plan: General   Post-op Pain Management:    Induction: Intravenous  PONV Risk Score and Plan: 3 and Ondansetron, Dexamethasone, Midazolam and Treatment may vary due to age or medical condition  Airway Management Planned: Oral ETT  Additional Equipment:    Intra-op Plan:   Post-operative Plan: Extubation in OR  Informed Consent: I have reviewed the patients History and Physical, chart, labs and discussed the procedure including the risks, benefits and alternatives for the proposed anesthesia with the patient or authorized representative who has indicated his/her understanding and acceptance.     Dental advisory given  Plan Discussed with: CRNA, Anesthesiologist and Surgeon  Anesthesia Plan Comments:         Anesthesia Quick Evaluation

## 2019-04-28 NOTE — Progress Notes (Signed)
Day of Surgery    CC:  Subjective: Doing well this AM, waiting on surgery. Still has pain RUQ.  Objective: Vital signs in last 24 hours: Temp:  [98.4 F (36.9 C)-99.9 F (37.7 C)] 98.7 F (37.1 C) (06/30 0516) Pulse Rate:  [65-70] 68 (06/30 0516) Resp:  [14-18] 18 (06/30 0516) BP: (82-108)/(40-79) 106/51 (06/30 0516) SpO2:  [96 %-99 %] 96 % (06/30 0516) Last BM Date: 04/25/19 520 Po 2700 IV Urine x 3 Afebrile, VSS Labs OK Intake/Output from previous day: 06/29 0701 - 06/30 0700 In: 2265.4 [P.O.:520; I.V.:1596; IV Piggyback:149.4] Out: -  Intake/Output this shift: No intake/output data recorded.  General appearance: alert, cooperative and no distress Resp: clear to auscultation bilaterally GI: soft, tender in the RUQ  Lab Results:  Recent Labs    04/26/19 1013 04/27/19 0526  WBC 20.6* 15.2*  HGB 13.8 12.0  HCT 42.9 38.5  PLT 295 224    BMET Recent Labs    04/27/19 0526 04/28/19 0510  NA 140 138  K 3.7 3.8  CL 105 108  CO2 24 23  GLUCOSE 103* 98  BUN 16 18  CREATININE 0.83 0.94  CALCIUM 8.4* 7.3*   PT/INR No results for input(s): LABPROT, INR in the last 72 hours.  Recent Labs  Lab 04/26/19 1013  AST 36  ALT 25  ALKPHOS 60  BILITOT 0.5  PROT 7.3  ALBUMIN 4.1     Lipase     Component Value Date/Time   LIPASE 28 04/26/2019 1013     Medications: . Chlorhexidine Gluconate Cloth  6 each Topical Daily  . metoprolol tartrate  2.5 mg Intravenous Q8H  . mupirocin ointment  1 application Nasal BID    Assessment/Plan Aortic stenosis status post TAVR,last Plavix was6/27 New onset atrial fibrillation noted [facility] Cardiology consulted by primary team for further evaluation and preoperative clearance;planning to see in the a.m. Hypertension Hyperlipidemia COPD  Acute cholecystitis   FEN: N.p.o./ IV fluids off ID: Zosyn 6/28 >> day 2 DVT: SCDs Follow-up: D[facility] clinic POC: Hofmann,Sven Son [phone]     Hofmann,Mariom Other   [phone]       Plan:  Laparoscopic cholecystectomy later today.   LOS: 2 days    Dominique [name] 04/28/2019 [phone]

## 2019-04-29 ENCOUNTER — Encounter (HOSPITAL_COMMUNITY): Payer: Self-pay | Admitting: General Surgery

## 2019-04-29 ENCOUNTER — Telehealth: Payer: Self-pay

## 2019-04-29 DIAGNOSIS — E7800 Pure hypercholesterolemia, unspecified: Secondary | ICD-10-CM

## 2019-04-29 LAB — CBC
HCT: 33.9 % — ABNORMAL LOW (ref 36.0–46.0)
Hemoglobin: 10.7 g/dL — ABNORMAL LOW (ref 12.0–15.0)
MCH: 30.2 pg (ref 26.0–34.0)
MCHC: 31.6 g/dL (ref 30.0–36.0)
MCV: 95.8 fL (ref 80.0–100.0)
Platelets: 226 10*3/uL (ref 150–400)
RBC: 3.54 MIL/uL — ABNORMAL LOW (ref 3.87–5.11)
RDW: 13.3 % (ref 11.5–15.5)
WBC: 10.5 10*3/uL (ref 4.0–10.5)
nRBC: 0 % (ref 0.0–0.2)

## 2019-04-29 LAB — BASIC METABOLIC PANEL
Anion gap: 6 (ref 5–15)
BUN: 13 mg/dL (ref 8–23)
CO2: 22 mmol/L (ref 22–32)
Calcium: 7.3 mg/dL — ABNORMAL LOW (ref 8.9–10.3)
Chloride: 110 mmol/L (ref 98–111)
Creatinine, Ser: 0.66 mg/dL (ref 0.44–1.00)
GFR calc Af Amer: >60 mL/min (ref >60)
GFR calc non Af Amer: >60 mL/min (ref >60)
Glucose, Bld: 136 mg/dL — ABNORMAL HIGH (ref 70–99)
Potassium: 4.2 mmol/L (ref 3.5–5.1)
Sodium: 138 mmol/L (ref 135–145)

## 2019-04-29 MED ORDER — TRAMADOL HCL 50 MG PO TABS: 50.0000 mg | ORAL_TABLET | Freq: Four times a day (QID) | ORAL | 0 refills | Status: DC | PRN

## 2019-04-29 MED ORDER — ACETAMINOPHEN 325 MG PO TABS: 650.0000 mg | ORAL_TABLET | Freq: Four times a day (QID) | ORAL | Status: DC | PRN

## 2019-04-29 MED ORDER — DIPHENHYDRAMINE HCL 25 MG PO CAPS: 25.0000 mg | ORAL_CAPSULE | Freq: Every evening | ORAL | 0 refills | Status: DC | PRN

## 2019-04-29 MED ORDER — METOPROLOL SUCCINATE ER 25 MG PO TB24: 25.0000 mg | ORAL_TABLET | Freq: Every day | ORAL | 1 refills | Status: DC

## 2019-04-29 MED ORDER — ASPIRIN 81 MG PO CHEW: 81.0000 mg | CHEWABLE_TABLET | Freq: Every day | ORAL | Status: DC

## 2019-04-29 MED ORDER — CLOPIDOGREL BISULFATE 75 MG PO TABS: 75.0000 mg | ORAL_TABLET | Freq: Every day | ORAL | 1 refills | Status: DC

## 2019-04-29 MED ORDER — METOPROLOL SUCCINATE ER 25 MG PO TB24: 25.0000 mg | ORAL_TABLET | Freq: Every day | ORAL | Status: DC

## 2019-04-29 MED ORDER — ONDANSETRON 4 MG PO TBDP: 4.0000 mg | ORAL_TABLET | Freq: Three times a day (TID) | ORAL | 0 refills | Status: DC | PRN

## 2019-04-29 NOTE — Telephone Encounter (Signed)
-----   Message from Sarina Ill, RN sent at 04/29/2019  9:13 AM EDT -----  ----- Message ----- From: Ledora Bottcher, PA Sent: 04/29/2019   9:00 AM EDT To: [address] Triage  Please arrange hospital follow up with Dr. [name] or an APP on her team for an in -office visit with EKG in 2 weeks.   Thanks Angie

## 2019-04-29 NOTE — Telephone Encounter (Signed)
Appt is 05-12-2019 @245pm  w/Lawrence, NP

## 2019-04-29 NOTE — Progress Notes (Signed)
[facility] Progress Note  1 Day Post-Op  Subjective: CC: no complaints Patient denies abdominal pain. Denies nausea. Tolerating diet, passing some gas. Would like to go home today and prefers telephone follow up visit to coming into the office.   Objective: Vital signs in last 24 hours: Temp:  [97.4 F (36.3 C)-99.1 F (37.3 C)] 97.9 F (36.6 C) (07/01 0526) Pulse Rate:  [66-80] 66 (07/01 0526) Resp:  [15-25] 16 (07/01 0526) BP: (101-125)/(50-60) 120/55 (07/01 0526) SpO2:  [88 %-100 %] 98 % (07/01 0526) Last BM Date: 04/25/19  Intake/Output from previous day: 06/30 0701 - 07/01 0700 In: 2437.8 [P.O.:360; I.V.:1954.9; IV Piggyback:122.9] Out: 50 [Blood:50] Intake/Output this shift: No intake/output data recorded.  PE: Gen:  Alert, NAD, pleasant Card:  Regular rate and rhythm, pedal pulses 2+ BL Pulm:  Normal effort, clear to auscultation bilaterally Abd: Soft, non-tender, non-distended, +BS, no HSM, incisions C/D/I Skin: warm and dry, no rashes  Psych: A&Ox3   Lab Results:  Recent Labs    04/27/19 0526 04/29/19 0511  WBC 15.2* 10.5  HGB 12.0 10.7*  HCT 38.5 33.9*  PLT 224 226   BMET Recent Labs    04/28/19 0510 04/29/19 0713  NA 138 138  K 3.8 4.2  CL 108 110  CO2 23 22  GLUCOSE 98 136*  BUN 18 13  CREATININE 0.94 0.66  CALCIUM 7.3* 7.3*   PT/INR No results for input(s): LABPROT, INR in the last 72 hours. CMP     Component Value Date/Time   NA 138 04/29/2019 0713   NA 140 11/13/2018 1310   K 4.2 04/29/2019 0713   CL 110 04/29/2019 0713   CO2 22 04/29/2019 0713   GLUCOSE 136 (H) 04/29/2019 0713   BUN 13 04/29/2019 0713   BUN 16 11/13/2018 1310   CREATININE 0.66 04/29/2019 0713   CREATININE 1.06 (H) 06/05/2016 1637   CALCIUM 7.3 (L) 04/29/2019 0713   PROT 7.3 04/26/2019 1013   PROT 6.9 03/10/2018 1108   ALBUMIN 4.1 04/26/2019 1013   ALBUMIN 4.4 03/10/2018 1108   AST 36 04/26/2019 1013   ALT 25 04/26/2019 1013   ALKPHOS 60  04/26/2019 1013   BILITOT 0.5 04/26/2019 1013   BILITOT 0.3 03/10/2018 1108   GFRNONAA >60 04/29/2019 0713   GFRAA >60 04/29/2019 0713   Lipase     Component Value Date/Time   LIPASE 28 04/26/2019 1013       Studies/Results: No results found.  Anti-infectives: Anti-infectives (From admission, onward)   Start     Dose/Rate Route Frequency Ordered Stop   04/28/19 2000  piperacillin-tazobactam (ZOSYN) IVPB 3.375 g     3.375 g 12.5 mL/hr over 240 Minutes Intravenous Every 8 hours 04/28/19 1440 04/29/19 0810   04/28/19 1300  piperacillin-tazobactam (ZOSYN) IVPB 3.375 g     3.375 g 100 mL/hr over 30 Minutes Intravenous On call to O.R. 04/28/19 1258 04/28/19 1303   04/26/19 2000  piperacillin-tazobactam (ZOSYN) IVPB 3.375 g  Status:  Discontinued     3.375 g 12.5 mL/hr over 240 Minutes Intravenous Every 8 hours 04/26/19 1800 04/28/19 1440   04/26/19 1800  piperacillin-tazobactam (ZOSYN) IVPB 3.375 g  Status:  Discontinued     3.375 g 100 mL/hr over 30 Minutes Intravenous Every 6 hours 04/26/19 1756 04/26/19 1759   04/26/19 1200  piperacillin-tazobactam (ZOSYN) IVPB 3.375 g     3.375 g 12.5 mL/hr over 240 Minutes Intravenous  Once 04/26/19 1150 04/27/19 1510  Assessment/Plan Aortic stenosis status post TAVR,last Plavix was6/27 New onset atrial fibrillation noted [facility] Cardiology consulted by primary team for further evaluation and preoperative clearance;planning to see in the a.m. Hypertension Hyperlipidemia COPD  Acute cholecystitis S/p laparoscopic cholecystectomy 04/28/19 Dr. Barry Dienes - POD#1 - tolerating diet and passing some flatus - pain well controlled - no further abx needed from a surgery standpoint - stable for discharge home. Follow up in chart, pain Rx sent to pharmacy  FEN: reg diet  ID: Zosyn 6/28>> no further abx needed  DVT: SCDs Follow-up: D[facility] clinic POC: Willow Ora (son) [phone] or Algis Greenhouse (daughter in law)  [phone]  LOS: 3 days    Brigid Re , [facility] Surgery 04/29/2019, 10:32 AM Pager: [phone] Consults: [phone]

## 2019-04-29 NOTE — Telephone Encounter (Signed)
Still admitted-04/26/2019 - present (3 days) [facility]

## 2019-04-29 NOTE — Telephone Encounter (Signed)
-----   Message from Ledora Bottcher, [place] sent at 04/29/2019  2:36 PM EDT ----- Can you please call patient with her appt? She was discharged before I could get it in her AVS.   Thanks Angie

## 2019-04-29 NOTE — Discharge Summary (Signed)
Physician Discharge Summary   Patient ID: Dominique [name] MRN: [id number] DOB/AGE: [birth year] 78 y.o.  Admit date: 04/26/2019 Discharge date: 04/29/2019  Primary Care Physician:  Colon Branch, MD   Recommendations for Outpatient Follow-up:  1. Follow up with PCP in 1-2 weeks 2. Patient recommended to hold antihypertensives amlodipine and benazepril.  Restart Toprol-XL on 7/2. 3. Patient recommended to follow-up with cardiology and general surgery.  Home Health: None  Equipment/Devices:   Discharge Condition: stable  CODE STATUS: FUL Diet recommendation: Heart healthy diet   Discharge Diagnoses:    . Acute cholecystitis status post laparoscopic cholecystectomy . Essential hypertension . Hyperlipidemia . COPD GOLD II . CAD (coronary artery disease), native coronary artery Paroxysmal atrial fibrillation with RVR, new diagnosis   Consults: Cardiology General surgery    Allergies:  No Known Allergies   DISCHARGE MEDICATIONS: Allergies as of 04/29/2019   No Known Allergies     Medication List    STOP taking these medications   amLODipine 10 MG tablet Commonly known as: NORVASC   benazepril 20 MG tablet Commonly known as: LOTENSIN     TAKE these medications   acetaminophen 325 MG tablet Commonly known as: TYLENOL Take 2 tablets (650 mg total) by mouth every 6 (six) hours as needed for mild pain (or Fever >/= 101).   albuterol 108 (90 Base) MCG/ACT inhaler Commonly known as: VENTOLIN HFA Inhale 2 puffs into the lungs every 6 (six) hours as needed for wheezing.   aspirin EC 81 MG tablet Take 81 mg by mouth daily.   CALCIUM 600 + D PO Take 600 mg by mouth 3 (three) times a week.   clopidogrel 75 MG tablet Commonly known as: PLAVIX Take 1 tablet (75 mg total) by mouth daily with breakfast.   denosumab 60 MG/ML Soln injection Commonly known as: PROLIA Inject 60 mg into the skin every 6 (six) months. Administer in upper arm, thigh, or abdomen    diphenhydrAMINE 25 mg capsule Commonly known as: BENADRYL Take 1 capsule (25 mg total) by mouth at bedtime as needed for sleep.   EPINEPHrine 0.3 mg/0.3 mL Soaj injection Commonly known as: EPI-PEN Inject 0.3 mg into the muscle as needed for anaphylaxis.   loratadine 10 MG tablet Commonly known as: CLARITIN Take 10 mg by mouth daily as needed for allergies.   magnesium oxide 400 (241.3 Mg) MG tablet Commonly known as: MAG-OX Take 1 tablet (400 mg total) by mouth daily.   metoprolol succinate 25 MG 24 hr tablet Commonly known as: Toprol XL Take 1 tablet (25 mg total) by mouth daily. Start taking on: April 30, 2019   multivitamin with minerals Tabs tablet Take 1 tablet by mouth daily.   ondansetron 4 MG disintegrating tablet Commonly known as: Zofran ODT Take 1 tablet (4 mg total) by mouth every 8 (eight) hours as needed for nausea or vomiting.   rosuvastatin 40 MG tablet Commonly known as: CRESTOR TAKE 1 TABLET BY MOUTH EVERY DAY   traMADol 50 MG tablet Commonly known as: ULTRAM Take 1 tablet (50 mg total) by mouth every 6 (six) hours as needed for moderate pain (pain not relieved by PO tylenol).   Vitamin D3 125 MCG (5000 UT) Caps Take 5,000 Units by mouth daily.        Brief H and P: For complete details please refer to admission H and P, but in brief Dominique [name] a 78 y.o.femalewith medical history significant foraortic valve disease status post TAVR March 3  on Plavix last dose Saturday, June 27 as well ashypertension, hyperlipidemia, COPD, and history of nonsustained V. Tach. Patient presented to [facility] with reported abdominal pain that came on yesterday at 8:30 PM. She reported right upper quadrant abdominal pain spreading to generalized abdominal pain with nausea and vomiting till approximately 3 AM. Patient reports it self resolved and she went to sleep and was still feeling poorly. She woke up with persistent abdominal pain again  vomiting. She presented to [facility] for further evaluation. There patient and work-up revealing pericholecystic edema with a normal common bile duct.  Patient was admitted with acute cholecystitis.  Hospital Course:     Acute cholecystitis -Patient was admitted to the medical service, she was placed on n.p.o. status with IV fluids -Aspirin and Plavix were placed on hold on 6/27 AM -General surgery was consulted, patient underwent laparoscopic cholecystectomy on 6/30, postop day #1. -She was placed on IV fluids and IV antibiotics with Zosyn -At the time of admission, WBC count 20.6, improved to normal 10.5 today Cleared by general surgery for discharge home.,  Tolerating solid diet  Paroxysmal atrial fibrillation with RVR -On 6/28, patient was noted to be in atrial fibrillation with RVR, heart rate in 120s, converted to normal sinus rhythm on 6/29 -Cardiology was consulted, patient was placed on IV Lopressor scheduled -TSH normal. -Patient was seen by cardiology, Dr. Meda Coffee, recommended to continue aspirin and Plavix - hold off on anticoagulation for now  Aortic stenosis status post TAVR complicated by ischemic left leg requiring femoral endarterectomy and patch angioplasty -PTA, patient was on aspirin and Plavix following angioplasty of the femoral artery, on hold for surgery.  Patient will resume aspirin and Plavix. -Per cardiology, no anticoagulation for now, will arrange for 2-week ZIO patch monitoring at the time of outpatient appointment   Essential hypertension -BP currently stable     COPD GOLD II -No wheezing, continue albuterol inhaler as needed  Anemia, normocytic -Likely has anemia of chronic disease, was 13.8 prior to surgery -H&H currently stable, 10.7 does not need transfusion   Day of Discharge S: No complaints, hoping to go home today  BP (!) 120/55 (BP Location: Left Arm)   Pulse 66   Temp 97.9 F (36.6 C) (Oral)   Resp 16   Ht 5\' 6"  (1.676  m)   Wt 72.7 kg   SpO2 98%   BMI 25.87 kg/m   Physical Exam: General: Alert and awake oriented x3 not in any acute distress. HEENT: anicteric sclera, pupils reactive to light and accommodation CVS: S1-S2 clear no murmur rubs or gallops Chest: clear to auscultation bilaterally, no wheezing rales or rhonchi Abdomen: soft nontender, nondistended, normal bowel sounds, incisions CDI Extremities: no cyanosis, clubbing or edema noted bilaterally Neuro: Cranial nerves II-XII intact, no focal neurological deficits   The results of significant diagnostics from this hospitalization (including imaging, microbiology, ancillary and laboratory) are listed below for reference.      Procedures/Studies:  Dg Chest 2 View  Result Date: 04/26/2019 CLINICAL DATA:  Shortness of breath EXAM: CHEST - 2 VIEW COMPARISON:  December 30, 2018 FINDINGS: There is no appreciable edema or consolidation. Heart size and pulmonary vascularity are normal. There is an aortic valve replacement. No adenopathy. No bone lesions. No pneumothorax. IMPRESSION: Status post aortic valve replacement. No edema or consolidation. Heart size normal. Electronically Signed   By: Lowella Grip III M.D.   On: 04/26/2019 11:35   Ct Abdomen Pelvis W Contrast  Result Date: 04/26/2019 CLINICAL DATA:  Right upper quadrant pain with nausea and vomiting beginning last night EXAM: CT ABDOMEN AND PELVIS WITH CONTRAST TECHNIQUE: Multidetector CT imaging of the abdomen and pelvis was performed using the standard protocol following bolus administration of intravenous contrast. CONTRAST:  132mL OMNIPAQUE IOHEXOL 300 MG/ML  SOLN COMPARISON:  12/09/2018 FINDINGS: Lower chest: Transcatheter aortic valve replacement. No acute finding Hepatobiliary: No focal liver abnormality.Full gallbladder with heterogeneous mural enhancement and mild pericholecystic edema. Layering sludge or calculi seen towards the fundus. No bile duct dilatation Pancreas: Unremarkable.  Spleen: Unremarkable. Adrenals/Urinary Tract: Negative adrenals. No hydronephrosis or stone. Renal sinus and right renal cortical cysts. Decompressed bladder which is essentially completely obscured by artifact from hip prostheses. Stomach/Bowel: Extensive distal colonic diverticulosis without acute inflammation. No pericecal inflammation. No bowel obstruction Vascular/Lymphatic: Atheromatous calcification. No acute vascular finding. No mass or adenopathy. Reproductive:Negative Other: No ascites or pneumoperitoneum. Musculoskeletal: Bilateral hip prosthesis. Bilateral sacral plasty. Lower lumbar laminectomy. Osteopenia and mild dextrocurvature of the lumbar spine. Remote left inferior pubic ramus fracture. IMPRESSION: Cholecystitis. Calcified gallstones are not visualized and ultrasound would be contributory. Electronically Signed   By: Monte Fantasia M.D.   On: 04/26/2019 11:35   US Abdomen Limited Ruq  Result Date: 04/26/2019 CLINICAL DATA:  78 year old female with right upper quadrant pain CT demonstrates cholecystitis EXAM: ULTRASOUND ABDOMEN LIMITED RIGHT UPPER QUADRANT COMPARISON:  CT 04/26/2019 FINDINGS: Gallbladder: Sonographic Murphy's sign recorded as negative. Gallbladder wall thickening measuring 6 mm. Hyperechoic material with posterior shadowing layer dependently within the gallbladder lumen, compatible with recent CT. Trace pericholecystic fluid. Common bile duct: Diameter: 3 mm Liver: No focal lesion identified. Within normal limits in parenchymal echogenicity. Portal vein is patent on color Doppler imaging with normal direction of blood flow towards the liver. IMPRESSION: Sonographic survey demonstrates cholelithiasis. The ultrasound study is equivocal for acute cholecystitis, as the sonographic Murphy's sign is negative, while there are suspicious findings of gallbladder wall thickening and pericholecystic fluid in the setting of cholelithiasis. If a confirmatory imaging test is needed for  acute calculus cholecystitis, HIDA study would be the test of choice. Electronically Signed   By: Corrie Mckusick D.O.   On: 04/26/2019 12:20      LAB RESULTS: Basic Metabolic Panel: Recent Labs  Lab 04/28/19 0510 04/29/19 0713  NA 138 138  K 3.8 4.2  CL 108 110  CO2 23 22  GLUCOSE 98 136*  BUN 18 13  CREATININE 0.94 0.66  CALCIUM 7.3* 7.3*  MG 1.8  --    Liver Function Tests: Recent Labs  Lab 04/26/19 1013  AST 36  ALT 25  ALKPHOS 60  BILITOT 0.5  PROT 7.3  ALBUMIN 4.1   Recent Labs  Lab 04/26/19 1013  LIPASE 28   No results for input(s): AMMONIA in the last 168 hours. CBC: Recent Labs  Lab 04/26/19 1013 04/27/19 0526 04/29/19 0511  WBC 20.6* 15.2* 10.5  NEUTROABS 18.2*  --   --   HGB 13.8 12.0 10.7*  HCT 42.9 38.5 33.9*  MCV 91.9 95.8 95.8  PLT 295 224 226   Cardiac Enzymes: No results for input(s): CKTOTAL, CKMB, CKMBINDEX, TROPONINI in the last 168 hours. BNP: Invalid input(s): POCBNP CBG: No results for input(s): GLUCAP in the last 168 hours.    Disposition and Follow-up: Discharge Instructions    Diet - low sodium heart healthy   Complete by: As directed    Discharge instructions   Complete by: As directed    Please  restart Toprol on 04/30/2019.  Continue to hold Norvasc and benazepril, your other BP meds until follow-up with cardiology.   Increase activity slowly   Complete by: As directed        DISPOSITION: [facility], [place]. Call on 05/19/2019.   Specialty: General Surgery Why: Your appointment is at 07/21 at 8:30 AM. A provider will call you during scheduled appointment time. Please send a photo of incisions with name and DOB to [email or web address] the day prior to appointment.  Contact information: [place] [address] [phone]        [name], MD. Schedule an appointment as soon as possible for a visit in 1  week(s).   Specialty: Cardiology Why: For hospital follow-up Contact information: [address] [place] [address] [phone]            Time coordinating discharge:   35 mins   Signed:   Estill Cotta M.D. [facility] 04/29/2019, 1:07 PM

## 2019-04-29 NOTE — Evaluation (Signed)
Physical Therapy Evaluation Patient Details Name: Dominique [name] MRN: [id number] DOB: December 19, 1940 Today's Date: 04/29/2019   History of Present Illness  Dominique [name] is a 78 y.o. female with medical history significant for aortic valve disease status post TAVR March 3 on Plavix last dose Saturday, June 27 as well as hypertension, hyperlipidemia, COPD, and history of nonsustained V. Tach.Patient presented to [facility] with reported abdominal pain. Pt s/p lap cholecystectomy.  Clinical Impression  Pt is independent with basic mobility on the unit. Pt is free to ambulate in her room or hall until d/c from hospital. Pt educated on increasing activity level to address endurance level. Pt does not need any further skilled PT and is ready for d/c home when cleared medically. Pt is d/c from PT services.    Follow Up Recommendations No PT follow up    Equipment Recommendations  None recommended by PT    Recommendations for Other Services       Precautions / Restrictions Precautions Precautions: None Restrictions Weight Bearing Restrictions: No      Mobility  Bed Mobility Overal bed mobility: Independent                Transfers Overall transfer level: Independent Equipment used: None                Ambulation/Gait Ambulation/Gait assistance: Independent Gait Distance (Feet): 200 Feet Assistive device: None Gait Pattern/deviations: WFL(Within Functional Limits)        Stairs            Wheelchair Mobility    Modified Rankin (Stroke Patients Only)       Balance Overall balance assessment: No apparent balance deficits (not formally assessed)                                           Pertinent Vitals/Pain Pain Assessment: 0-10 Pain Score: 2  Pain Location: abdomin Pain Descriptors / Indicators: Discomfort Pain Intervention(s): Limited activity within patient's tolerance;Monitored during session    Home Living  Family/patient expects to be discharged to:: Private residence Living Arrangements: Alone Available Help at Discharge: Family Type of Home: House Home Access: Level entry     Home Layout: One level Home Equipment: None      Prior Function Level of Independence: Independent               Hand Dominance        Extremity/Trunk Assessment   Upper Extremity Assessment Upper Extremity Assessment: Defer to OT evaluation    Lower Extremity Assessment Lower Extremity Assessment: Overall WFL for tasks assessed    Cervical / Trunk Assessment Cervical / Trunk Assessment: Normal  Communication   Communication: No difficulties  Cognition Arousal/Alertness: Awake/alert Behavior During Therapy: WFL for tasks assessed/performed Overall Cognitive Status: Within Functional Limits for tasks assessed                                        General Comments General comments (skin integrity, edema, etc.): no LOB with head turns, backwards walking, turns    Exercises     Assessment/Plan    PT Assessment Patent does not need any further PT services  PT Problem List         PT Treatment Interventions  PT Goals (Current goals can be found in the Care Plan section)  Acute Rehab PT Goals Patient Stated Goal: To return home    Frequency     Barriers to discharge        Co-evaluation               AM-PAC PT "6 Clicks" Mobility  Outcome Measure Help needed turning from your back to your side while in a flat bed without using bedrails?: None Help needed moving from lying on your back to sitting on the side of a flat bed without using bedrails?: None Help needed moving to and from a bed to a chair (including a wheelchair)?: None Help needed standing up from a chair using your arms (e.g., wheelchair or bedside chair)?: None Help needed to walk in hospital room?: None Help needed climbing 3-5 steps with a railing? : None 6 Click Score: 24    End  of Session Equipment Utilized During Treatment: Gait belt Activity Tolerance: Patient tolerated treatment well Patient left: in bed;with call bell/phone within reach Nurse Communication: Mobility status(pt is Independent) PT Visit Diagnosis: Other abnormalities of gait and mobility (R26.89)    Time: 5520-8022 PT Time Calculation (min) (ACUTE ONLY): 14 min   Charges:   PT Evaluation $PT Eval Low Complexity: [place], PT  Lelon Mast 04/29/2019, 11:44 AM

## 2019-04-29 NOTE — Care Management Important Message (Signed)
Important Message  Patient Details IM Letter given to Dessa Phi RN to present to the Patient Name: Dominique [name] MRN: [id number] Date of Birth: 1941-04-05   Medicare Important Message Given:  Yes     Kerin Salen 04/29/2019, 12:51 PM

## 2019-04-29 NOTE — Progress Notes (Addendum)
Progress Note  Patient Name: Dominique [name] Date of Encounter: 04/29/2019  Primary Cardiologist: [name], MD   Subjective   Tachycardic rate in the 120s last evening, she thinks she was sleeping, asymptomatic - will review rhythm with attending  Inpatient Medications    Scheduled Meds: . Chlorhexidine Gluconate Cloth  6 each Topical Daily  . metoprolol tartrate  2.5 mg Intravenous Q8H  . mupirocin ointment  1 application Nasal BID   Continuous Infusions: . sodium chloride 75 mL/hr at 04/29/19 0600   PRN Meds: acetaminophen **OR** acetaminophen, albuterol, diphenhydrAMINE, HYDROmorphone (DILAUDID) injection, ketorolac, ondansetron **OR** ondansetron (ZOFRAN) IV, traMADol, traZODone   Vital Signs    Vitals:   04/28/19 2001 04/28/19 2058 04/29/19 0205 04/29/19 0526  BP:  113/60 (!) 101/58 (!) 120/55  Pulse:  78 66 66  Resp:  18 18 16   Temp:  99.1 F (37.3 C) 98.3 F (36.8 C) 97.9 F (36.6 C)  TempSrc:  Oral Oral Oral  SpO2: 94% 96% 96% 98%  Weight:      Height:        Intake/Output Summary (Last 24 hours) at 04/29/2019 0852 Last data filed at 04/29/2019 0600 Gross per 24 hour  Intake 2437.79 ml  Output 50 ml  Net 2387.79 ml   Last 3 Weights 04/26/2019 04/01/2019 02/19/2019  Weight (lbs) 160 lb 4.4 oz 157 lb 155 lb  Weight (kg) 72.7 kg 71.215 kg 70.308 kg      Telemetry    Sinus rhythm Sinus tachycardia 2122-2127 HR 120s, rhythm difficult to determine - Personally Reviewed  ECG    04/26/19: atrial fibrillation with RVR, HR 129 04/27/19: sinus rhythm, HR 62, PAC  - Personally Reviewed  Physical Exam   GEN: No acute distress.   Neck: No JVD Cardiac: RRR, 1/6 systolic murmur, S2 Respiratory: Clear to auscultation bilaterally. GI: Soft, nontender, non-distended  MS: trace edema; No deformity. Neuro:  Nonfocal  Psych: Normal affect   Labs    High Sensitivity Troponin:   Recent Labs  Lab 04/26/19 1013 04/26/19 1224  TROPONINIHS 8 7       Cardiac EnzymesNo results for input(s): TROPONINI in the last 168 hours. No results for input(s): TROPIPOC in the last 168 hours.   Chemistry Recent Labs  Lab 04/26/19 1013 04/27/19 0526 04/28/19 0510 04/29/19 0713  NA 138 140 138 138  K 3.8 3.7 3.8 4.2  CL 102 105 108 110  CO2 25 24 23 22   GLUCOSE 157* 103* 98 136*  BUN 17 16 18 13   CREATININE 0.70 0.83 0.94 0.66  CALCIUM 10.2 8.4* 7.3* 7.3*  PROT 7.3  --   --   --   ALBUMIN 4.1  --   --   --   AST 36  --   --   --   ALT 25  --   --   --   ALKPHOS 60  --   --   --   BILITOT 0.5  --   --   --   GFRNONAA >60 >60 58* >60  GFRAA >60 >60 >60 >60  ANIONGAP 11 11 7 6      Hematology Recent Labs  Lab 04/26/19 1013 04/27/19 0526 04/29/19 0511  WBC 20.6* 15.2* 10.5  RBC 4.67 4.02 3.54*  HGB 13.8 12.0 10.7*  HCT 42.9 38.5 33.9*  MCV 91.9 95.8 95.8  MCH 29.6 29.9 30.2  MCHC 32.2 31.2 31.6  RDW 13.5 14.0 13.3  PLT 295 224 226  BNPNo results for input(s): BNP, PROBNP in the last 168 hours.   DDimer No results for input(s): DDIMER in the last 168 hours.   Radiology    No results found.  Cardiac Studies   Cardiac telemetry monitor 01/29/19: The basic rhythm is normal sinus There are no bradycardic events There is no atrial fibrillation or flutter There are episodes of a wide complex that appears to be artifactual   Echo 01/26/19:  1. The left ventricle has normal systolic function with an ejection fraction of 60-65%. The cavity size was normal. Left ventricular diastolic parameters were normal.  2. The right ventricle has normal systolic function. The cavity was normal. There is no increase in right ventricular wall thickness.  3. No evidence present in the left atrial appendage.  4. Mild thickening of the mitral valve leaflet.  5. Post TAVR with 26 mm Medtronic Core Valve Mean gradient low and stable since echo done 12/31/18 and no significant PVL.  6. No pulmonic valve vegetation visualized.   Right and left  heart cath 11/27/18: 1. Angiographically normal coronary arteries (left dominant) 2. Calcified, restricted aortic valve by plain fluoroscopy with hemodynamic findings consistent with moderate aortic stenosis (mean gradient 17 mmHg, AVA 1.47 square cm) 3. Normal right heart pressures  The patient has progressive dyspnea, NYHA III sx's, with echo findings consistent with severe AS. Will proceed with multidisciplinary evaluation of aortic stenosis.  Patient Profile     78 y.o. female with a hx of aortic stenosis s/p TAVR 12/2018 (c/b ischemic L leg s/p emergent repair of left femoral artery with endarterectomy and Dacron patch angioplasty), normal coronaries by cath 10/2018, COPD, HLD, HTN, mild pulm HTN (not noted on 12/2018 echo) hypomagnesemia, NSVT (by event monitor 01/2019), thoracic aortic aneurysm, pulmonary nodules  who is being followed for  pre-op clearance and rapid atrial fib.   Assessment & Plan    1. Laparoscopic cholecystectomy - surgery yesterday without cardiac complications - she has started PO food this morning and tolerating well - would like to discharge this afternoon   2. Atrial fibrillation with RVR - new diagnosis this admission 3. NSVT on monitor vs artifact, treated with Mg and lopressor - telemetry with 5 min bout of tachycardia last evening 2122-2127 in the 120s, difficult to determine rhythm, will review with attending - lopressor given at 2139 and again 0610 - lopressor IV 2.5 mg q8hr scheduled - asymptomatic with Afib on EKG, asymptomatic with tachycardia last evening, she was sleeping - TSH WNL, Mg 1.8, K 4.2 - This patients CHA2DS2-VASc Score and unadjusted Ischemic Stroke Rate (% per year) is equal to 4.8 % stroke rate/year from a score of 17 (age, female, HTN) - will need to discuss anticoagulation in the setting of DAPT following femoral artery angioplasty  4. Hypertension - home medications 10 mg norvasc, 20 mg benazepril, 25 mg toprol daily - on hold -  Pressures well-controlled 540-086P systolic - renal function normal - resume 10 mg benazepril (1/2 tablet) at discharge as pressure allows  5. Aortic stenosis s/p TAVR complicated by ischemic left leg requiring femoral endarterectomy and patch angioplasty - stable on echo 12/2018 - on ASA and plavix following angioplasty of femoral artery - has been on hold while NPO - if anticoagulated for PAF as above, will discuss mono antiplatelet therapy moving forward  6. Anemia - post-op Hb 10.7, was 13.8 prior to surgery - per surgery  7. Leukocytosis - trending down, 10.5 this morning - per surgery  Will  arrange cardiology follow up. Resume BP medications as tolerated.    For questions or updates, please contact [place] Please consult [email or web address] for contact info under     Signed, Ledora Bottcher, PA  04/29/2019, 8:52 AM     The patient was seen, examined and discussed with Minette Brine , PA-C and I agree with the above.   The patient is looking well, appears mildly dehydrated with hypotension and tachycardia, I would encourage PO fluids, hold all of her BP meds - norvasc, benazepril, restart Toprol XL tomorrow am, we will arrange for an early follow up in our clinic next week. No anticoagulation, we will arrange for 2 week Zio patch monitoring at the time of outpatient appointment.  Ena Dawley, MD

## 2019-04-29 NOTE — Discharge Instructions (Signed)
[facility] ______[facility] LAPAROSCOPIC SURGERY: POST OP INSTRUCTIONS Always review your discharge instruction sheet given to you by the facility where your surgery was performed. IF YOU HAVE DISABILITY OR FAMILY LEAVE FORMS, YOU MUST BRING THEM TO THE OFFICE FOR PROCESSING.   DO NOT GIVE THEM TO YOUR DOCTOR.  1. A prescription for pain medication may be given to you upon discharge.  Take your pain medication as prescribed, if needed.  If narcotic pain medicine is not needed, then you may take acetaminophen (Tylenol) or ibuprofen (Advil) as needed. 2. Take your usually prescribed medications unless otherwise directed. 3. If you need a refill on your pain medication, please contact your pharmacy.  They will contact our office to request authorization. Prescriptions will not be filled after 5pm or on week-ends. 4. You should follow a light diet the first few days after arrival home, such as soup and crackers, etc.  Be sure to include lots of fluids daily. 5. Most patients will experience some swelling and bruising in the area of the incisions.  Ice packs will help.  Swelling and bruising can take several days to resolve.  6. It is common to experience some constipation if taking pain medication after surgery.  Increasing fluid intake and taking a stool softener (such as Colace) will usually help or prevent this problem from occurring.  A mild laxative (Milk of Magnesia or Miralax) should be taken according to package instructions if there are no bowel movements after 48 hours. 7. Unless discharge instructions indicate otherwise, you may remove your bandages 24-48 hours after surgery, and you may shower at that time.  You may have steri-strips (small skin tapes) in place directly over the incision.  These strips should be left on the skin for 7-10 days.  If your surgeon used skin glue on the incision, you may shower in 24 hours.  The glue will flake off over the next 2-3 weeks.  Any sutures or  staples will be removed at the office during your follow-up visit. 8. ACTIVITIES:  You may resume regular (light) daily activities beginning the next day--such as daily self-care, walking, climbing stairs--gradually increasing activities as tolerated.  You may have sexual intercourse when it is comfortable.  Refrain from any heavy lifting or straining until approved by your doctor. a. You may drive when you are no longer taking prescription pain medication, you can comfortably wear a seatbelt, and you can safely maneuver your car and apply brakes.  9. You should see your doctor in the office for a follow-up appointment approximately 2-3 weeks after your surgery.  Make sure that you call for this appointment within a day or two after you arrive home to insure a convenient appointment time.  WHEN TO CALL YOUR DOCTOR: 1. Fever over 101.0 2. Inability to urinate 3. Continued bleeding from incision. 4. Increased pain, redness, or drainage from the incision. 5. Increasing abdominal pain  The clinic staff is available to answer your questions during regular business hours.  Please dont hesitate to call and ask to speak to one of the nurses for clinical concerns.  If you have a medical emergency, go to the nearest emergency room or call 911.  A surgeon from [facility] is always on call at the hospital. [address] [place], [place], [place]  [address] ? [address] [place], [place], [place]   [address] [phone] ? [phone] ? FAX [phone] Web site: [email or web address]      Managing Your Pain  After Surgery Without Opioids    Thank you for participating in our program to help patients manage their pain after surgery without opioids. This is part of our effort to provide you with the best care possible, without exposing you or your family to the risk that opioids pose.  What pain can I expect after surgery? You can expect to have some pain after  surgery. This is normal. The pain is typically worse the day after surgery, and quickly begins to get better. Many studies have found that many patients are able to manage their pain after surgery with Over-the-Counter (OTC) medications such as Tylenol and Motrin. If you have a condition that does not allow you to take Tylenol or Motrin, notify your surgical team.  How will I manage my pain? The best strategy for controlling your pain after surgery is around the clock pain control with Tylenol (acetaminophen) and Motrin (ibuprofen or Advil). Alternating these medications with each other allows you to maximize your pain control. In addition to Tylenol and Motrin, you can use heating pads or ice packs on your incisions to help reduce your pain.  How will I alternate your regular strength over-the-counter pain medication? You will take a dose of pain medication every three hours. ; Start by taking 650 mg of Tylenol (2 pills of 325 mg) ; 3 hours later take 600 mg of Motrin (3 pills of 200 mg) ; 3 hours after taking the Motrin take 650 mg of Tylenol ; 3 hours after that take 600 mg of Motrin.   - 1 -  See example - if your first dose of Tylenol is at 12:00 PM   12:00 PM Tylenol 650 mg (2 pills of 325 mg)  3:00 PM Motrin 600 mg (3 pills of 200 mg)  6:00 PM Tylenol 650 mg (2 pills of 325 mg)  9:00 PM Motrin 600 mg (3 pills of 200 mg)  Continue alternating every 3 hours   We recommend that you follow this schedule around-the-clock for at least 3 days after surgery, or until you feel that it is no longer needed. Use the table on the last page of this handout to keep track of the medications you are taking. Important: Do not take more than 3000mg  of Tylenol or 3200mg  of Motrin in a 24-hour period. Do not take ibuprofen/Motrin if you have a history of bleeding stomach ulcers, severe kidney disease, &/or actively taking a blood thinner  What if I still have pain? If you have pain that is not  controlled with the over-the-counter pain medications (Tylenol and Motrin or Advil) you might have what we call breakthrough pain. You will receive a prescription for a small amount of an opioid pain medication such as Oxycodone, Tramadol, or Tylenol with Codeine. Use these opioid pills in the first 24 hours after surgery if you have breakthrough pain. Do not take more than 1 pill every 4-6 hours.  If you still have uncontrolled pain after using all opioid pills, don't hesitate to call our staff using the number provided. We will help make sure you are managing your pain in the best way possible, and if necessary, we can provide a prescription for additional pain medication.   Day 1    Time  Name of Medication Number of pills taken  Amount of Acetaminophen  Pain Level   Comments  AM PM       AM PM       AM PM  AM PM       AM PM       AM PM       AM PM       AM PM       Total Daily amount of Acetaminophen Do not take more than  3,000 mg per day      Day 2    Time  Name of Medication Number of pills taken  Amount of Acetaminophen  Pain Level   Comments  AM PM       AM PM       AM PM       AM PM       AM PM       AM PM       AM PM       AM PM       Total Daily amount of Acetaminophen Do not take more than  3,000 mg per day      Day 3    Time  Name of Medication Number of pills taken  Amount of Acetaminophen  Pain Level   Comments  AM PM       AM PM       AM PM       AM PM          AM PM       AM PM       AM PM       AM PM       Total Daily amount of Acetaminophen Do not take more than  3,000 mg per day      Day 4    Time  Name of Medication Number of pills taken  Amount of Acetaminophen  Pain Level   Comments  AM PM       AM PM       AM PM       AM PM       AM PM       AM PM       AM PM       AM PM       Total Daily amount of Acetaminophen Do not take more than  3,000 mg per day      Day 5    Time  Name of Medication  Number of pills taken  Amount of Acetaminophen  Pain Level   Comments  AM PM       AM PM       AM PM       AM PM       AM PM       AM PM       AM PM       AM PM       Total Daily amount of Acetaminophen Do not take more than  3,000 mg per day       Day 6    Time  Name of Medication Number of pills taken  Amount of Acetaminophen  Pain Level  Comments  AM PM       AM PM       AM PM       AM PM       AM PM       AM PM       AM PM       AM PM       Total Daily amount of Acetaminophen Do not take more than  3,000 mg per day      Day 7    Time  Name of Medication Number of pills taken  Amount of Acetaminophen  Pain Level   Comments  AM PM       AM PM       AM PM       AM PM       AM PM       AM PM       AM PM       AM PM       Total Daily amount of Acetaminophen Do not take more than  3,000 mg per day        For additional information about how and where to safely dispose of unused opioid medications - RoleLink.com.br  Disclaimer: This document contains information and/or instructional materials adapted from [facility] for the typical patient with your condition. It does not replace medical advice from your health care provider because your experience may differ from that of the typical patient. Talk to your health care provider if you have any questions about this document, your condition or your treatment plan. Adapted from [place] If you have a gallbladder condition, you may have trouble digesting fats. Eating a low-fat diet can help reduce your symptoms, and may be helpful before and after having surgery to remove your gallbladder (cholecystectomy). Your health care provider may recommend that you work with a diet and nutrition specialist (dietitian) to help you reduce the amount of fat in your diet. What are tips for following this plan? General guidelines  Limit your fat intake to less  than 30% of your total daily calories. If you eat around 1,800 calories each day, this is less than 60 grams (g) of fat per day.  Fat is an important part of a healthy diet. Eating a low-fat diet can make it hard to maintain a healthy body weight. Ask your dietitian how much fat, calories, and other nutrients you need each day.  Eat small, frequent meals throughout the day instead of three large meals.  Drink at least 8-10 cups of fluid a day. Drink enough fluid to keep your urine clear or pale yellow.  Limit alcohol intake to no more than 1 drink a day for nonpregnant women and 2 drinks a day for men. One drink equals 12 oz of beer, 5 oz of wine, or 1 oz of hard liquor. Reading food labels  Check Nutrition Facts on food labels for the amount of fat per serving. Choose foods with less than 3 grams of fat per serving. Shopping  Choose nonfat and low-fat healthy foods. Look for the words nonfat, low fat, or fat free.  Avoid buying processed or prepackaged foods. Cooking  Cook using low-fat methods, such as baking, broiling, grilling, or boiling.  Cook with small amounts of healthy fats, such as olive oil, grapeseed oil, canola oil, or sunflower oil. What foods are recommended?   All fresh, frozen, or canned fruits and vegetables.  Whole grains.  Low-fat or non-fat (skim) milk and yogurt.  Lean meat, skinless poultry, fish, eggs, and beans.  Low-fat protein supplement powders or drinks.  Spices and herbs. What foods are not recommended?  High-fat foods. These include baked goods, fast food, fatty cuts of meat, ice cream, french toast, sweet rolls, pizza, cheese bread, foods covered with butter, creamy sauces, or cheese.  Fried foods. These include french fries, tempura, battered fish, breaded chicken,  fried breads, and sweets.  Foods with strong odors.  Foods that cause bloating and gas. Summary  A low-fat diet can be helpful if you have a gallbladder condition, or  before and after gallbladder surgery.  Limit your fat intake to less than 30% of your total daily calories. This is about 60 g of fat if you eat 1,800 calories each day.  Eat small, frequent meals throughout the day instead of three large meals. This information is not intended to replace advice given to you by your health care provider. Make sure you discuss any questions you have with your health care provider. Document Released: 10/20/2013 Document Revised: 02/05/2019 Document Reviewed: 11/22/2016 Elsevier Patient Education  2020 [employer].

## 2019-04-30 ENCOUNTER — Telehealth (HOSPITAL_COMMUNITY): Payer: Self-pay

## 2019-04-30 ENCOUNTER — Telehealth: Payer: Self-pay | Admitting: *Deleted

## 2019-04-30 NOTE — Telephone Encounter (Signed)
Called pt for TCM. States she is doing "very well". Declines to schedule hospital follow up with PCP, reporting she already has one with cardiology. Pt states she will call if she needs anything prior to her already scheduled appt.

## 2019-04-30 NOTE — Telephone Encounter (Signed)
Closed referral per nurse navigator. Tedra Sen[place]. Support Rep II

## 2019-04-30 NOTE — Telephone Encounter (Signed)
TOC call, spoke to patient she is feeling well this morning.Stated she understands discharge instructions and is taking medications as prescribed.Advised to keep post hospital appointment with Jory Sims DNP 7/14 at 2:45 pm.

## 2019-04-30 NOTE — Telephone Encounter (Signed)
04/26/2019 - 04/29/2019 [place]

## 2019-05-08 ENCOUNTER — Other Ambulatory Visit: Payer: Self-pay

## 2019-05-08 DIAGNOSIS — S75002D Unspecified injury of femoral artery, left leg, subsequent encounter: Secondary | ICD-10-CM

## 2019-05-11 ENCOUNTER — Telehealth: Payer: Self-pay | Admitting: Adult Health

## 2019-05-11 NOTE — Telephone Encounter (Signed)
LVM, reminding pt of her appt with Jory Sims on 05-12-19.

## 2019-05-11 NOTE — Progress Notes (Signed)
Cardiology Office Note   Date:  05/12/2019   ID:  Dominique [name], DOB 1941-07-30, MRN [id number]  PCP:  Colon Branch, MD  Cardiologist:  Dr.  [name]  CC: [facility] follow up   History of Present Illness: Dominique [name] is a 78 y.o. female who presents for post hospital follow-up after admission for acute cholecystitis.  Her labs did reveal leukocytosis with white blood cells of 20 K and normal LFTs.  She was COVID negative.  She was seen on consultation by Dr. Lilian Kapur, in the setting of atrial fib with RVR confined by EKG..  The patient has a history of TAVR, with preprocedure cath showing angiographically normal coronaries.  TAVR was completed on 01/16/2019.  Postop course was complicated by severe acute ischemia felt related to probable atrial flap and closure site and she underwent emergent repair of the left femoral artery with endarterectomy and Dacron patch angioplasty.  Prior to hospitalization, the patient was feeling great, she had no complaints of chest pain shortness of breath and was able to perform all ADLs.  However on day of admission she was having significant nausea and vomiting.  She did have some right upper quadrant pain when she was moving about in her hospital bed.  On consultation on 04/27/2019 the patient was found to be a low risk for cardiovascular events perioperatively.  She was not plan for any cardiovascular testing and was okay to proceed with cholecystectomy.  She was found to be in normal sinus rhythm on evaluation.  Due to hypotension, benazepril and amlodipine were placed on hold.  Postoperatively, the patient became tachycardic with heart rates in the 120 bpm range while she was asleep.  She was treated with Lopressor.  2.5 mg IV x1 and every 8 hours. She has a CHADS VASC Score of 4.  She was not placed on anticoagulation therapy and was arranged to have a Zio  patch monitoring at home as an outpatient to evaluate recurrence of atrial fibrillation and  tachypalpitations.  Toprol XL 25 mg was restarted on discharge.  She comes today feeling much better.  She has multiple questions about whether or not she needs to restart her antihypertensive medications or be started on anticoagulation.  She denies rapid heart rhythm or irregular heart rhythm except for once when she felt her heart pounding irregularly with some flushing.  This occurred while she was in bed at night.  Otherwise she has been doing well without any pain in her left leg, no chest pain, no dyspnea on exertion.  She is out walking again.  She is ready "to get back to normal" and move forward with her life.  She has been very compliant with her medications.  Past Medical History:  Diagnosis Date  . Atrial fibrillation ([place])    a. dx 03/2019 while in hospital with cholecystitis  . COPD (chronic obstructive pulmonary disease) ([place])   . Critical lower limb ischemia    a. after TAVR developed ischemic L leg likely due to flap/closure, s/p emergent repair of left femoral artery with endarterectomy and Dacron patch angioplasty.  . Emphysema lung ([place])   . Hyperlipidemia    LDL goal < 70  . Hypertension   . Hypomagnesemia   . Normal coronary arteries 10/2018  . NSVT (nonsustained ventricular tachycardia) ([place]) 03/02/2019  . Pulmonary HTN ([place]) 06/05/2016   Moderate with PASP 1mmHg by echo 04/2016 likely Group 3 from COPD and possibly Group 2 from pulmonary venous HTN associated  with moderate AS - f/u echo 12/2018 post TAVR showed normal RVSP  . Pulmonary nodules    seen on pre TAVR CT, needs 12 month CT follow up  . 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 Sacral fracture ([place])   . Severe aortic stenosis    a. s/p TAVR 12/2018.  Marland Kitchen Thoracic ascending aortic aneurysm [facility])    needs yearly follow up    Past Surgical History:  Procedure Laterality Date  . APPENDECTOMY  1951  .  CATARACT EXTRACTION    . CESAREAN SECTION     1971  . CHOLECYSTECTOMY N/A 04/28/2019   Procedure: LAPAROSCOPIC CHOLECYSTECTOMY;  Surgeon: Stark Klein, MD;  Location: [facility];  Service: General;  Laterality: N/A;  . COLONOSCOPY  2018  . FEMORAL-POPLITEAL BYPASS GRAFT Left 12/31/2018   Procedure: PATCH ANGIOPLASTY REPAIR LEFT FEMORAL ARTERY USING HEMASHIELD PLATINUM FINESSE PATCH;  Surgeon: Rosetta Posner, MD;  Location: [place];  Service: Vascular;  Laterality: Left;  . IR RADIOLOGY PERIPHERAL GUIDED IV START  05/30/2017  . IR RADIOLOGY PERIPHERAL GUIDED IV START  09/02/2018  . IR RADIOLOGY PERIPHERAL GUIDED IV START  09/02/2018  . IR US GUIDE VASC ACCESS LEFT  09/02/2018  . IR US GUIDE VASC ACCESS RIGHT  05/30/2017  . IR US GUIDE VASC ACCESS RIGHT  09/02/2018  . JOINT REPLACEMENT  2016   sacroplasty  . LAMINECTOMY    . RIGHT/LEFT HEART CATH AND CORONARY ANGIOGRAPHY N/A 11/27/2018   Procedure: RIGHT/LEFT HEART CATH AND CORONARY ANGIOGRAPHY;  Surgeon: Sherren Mocha, MD;  Location: [place] [facility];  Service: Cardiovascular;  Laterality: N/A;  . TEE WITHOUT CARDIOVERSION N/A 12/30/2018   Procedure: TRANSESOPHAGEAL ECHOCARDIOGRAM (TEE);  Surgeon: Sherren Mocha, MD;  Location: [place] [facility];  Service: Open Heart Surgery;  Laterality: N/A;  . TOTAL HIP ARTHROPLASTY Bilateral   . TRANSCATHETER AORTIC VALVE REPLACEMENT, TRANSFEMORAL N/A 12/30/2018   Procedure: TRANSCATHETER AORTIC VALVE REPLACEMENT, TRANSFEMORAL;  Surgeon: Sherren Mocha, MD;  Location: [place] [facility];  Service: Open Heart Surgery;  Laterality: N/A;  . TUBAL LIGATION  1975     Current Outpatient Medications  Medication Sig Dispense Refill  . acetaminophen (TYLENOL) 325 MG tablet Take 2 tablets (650 mg total) by mouth every 6 (six) hours as needed for mild pain (or Fever >/= 101).    Marland Kitchen albuterol (PROVENTIL HFA;VENTOLIN HFA) 108 (90 Base) MCG/ACT inhaler Inhale 2 puffs into the lungs every 6 (six) hours as needed for wheezing.     Marland Kitchen aspirin EC 81 MG tablet Take 81 mg by mouth daily.    . Calcium Carb-Cholecalciferol (CALCIUM 600 + D PO) Take 600 mg by mouth 3 (three) times a week.     . Cholecalciferol (VITAMIN D3) 5000 UNITS CAPS Take 5,000 Units by mouth daily.     Marland Kitchen denosumab (PROLIA) 60 MG/ML SOLN injection Inject 60 mg into the skin every 6 (six) months. Administer in upper arm, thigh, or abdomen    . EPINEPHrine 0.3 mg/0.3 mL IJ SOAJ injection Inject 0.3 mg into the muscle as needed for anaphylaxis.   12  . loratadine (CLARITIN) 10 MG tablet Take 10 mg by mouth daily as needed for allergies.     . magnesium oxide (MAG-OX) 400 (241.3 Mg) MG tablet Take 1 tablet (400 mg total) by mouth daily. 90 tablet 1  . metoprolol succinate (TOPROL XL) 25 MG 24  hr tablet Take 1 tablet (25 mg total) by mouth daily. 90 tablet 1  . Multiple Vitamin (MULTIVITAMIN WITH MINERALS) TABS tablet Take 1 tablet by mouth daily.    . ondansetron (ZOFRAN ODT) 4 MG disintegrating tablet Take 1 tablet (4 mg total) by mouth every 8 (eight) hours as needed for nausea or vomiting. 20 tablet 0  . traMADol (ULTRAM) 50 MG tablet Take 1 tablet (50 mg total) by mouth every 6 (six) hours as needed for moderate pain (pain not relieved by PO tylenol). 15 tablet 0  . clopidogrel (PLAVIX) 75 MG tablet Take 1 tablet (75 mg total) by mouth daily with breakfast. (Patient not taking: Reported on 05/12/2019) 90 tablet 1  . diphenhydrAMINE (BENADRYL) 25 mg capsule Take 1 capsule (25 mg total) by mouth at bedtime as needed for sleep. (Patient not taking: Reported on 05/12/2019) 30 capsule 0  . rosuvastatin (CRESTOR) 40 MG tablet TAKE 1 TABLET BY MOUTH EVERY DAY (Patient not taking: Reported on 05/12/2019) 90 tablet 3   No current facility-administered medications for this visit.     Allergies:   Patient has no known allergies.    Social History:  The patient  reports that she quit smoking about 15 years ago. Her smoking use included cigarettes. She has a 40.00  pack-year smoking history. She has never used smokeless tobacco. She reports current alcohol use of about 3.0 - 4.0 standard drinks of alcohol per week. She reports that she does not use drugs.   Family History:  The patient's family history includes Breast cancer in her mother; Cancer in her brother; Diabetes in her son.    ROS: All other systems are reviewed and negative. Unless otherwise mentioned in H&P    PHYSICAL EXAM: VS:  BP 122/80   Pulse 85   Temp 98.4 F (36.9 C)   Wt 157 lb 9.6 oz (71.5 kg)   SpO2 98%   BMI 25.44 kg/m  , BMI Body mass index is 25.44 kg/m. GEN: Well nourished, well developed, in no acute distress HEENT: normal Neck: no JVD, carotid bruits, or masses Cardiac: RRR; soft 1/6 systolic murmurs, rubs, or gallops,no edema  Respiratory:  Clear to auscultation bilaterally, normal work of breathing GI: soft, nontender, nondistended, + BS MS: no deformity or atrophy. Well healed incision to the left groin. Strong pulses. No bruit.  Skin: warm and dry, no rash Neuro:  Strength and sensation are intact Psych: euthymic mood, full affect   EKG: Sinus rhythm, heart rate of 75 bpm.  Recent Labs: 12/26/2018: B Natriuretic Peptide 65.3 04/26/2019: ALT 25 04/28/2019: Magnesium 1.8; TSH 1.185 04/29/2019: BUN 13; Creatinine, Ser 0.66; Hemoglobin 10.7; Platelets 226; Potassium 4.2; Sodium 138    Lipid Panel    Component Value Date/Time   CHOL 156 03/10/2018 1108   TRIG 61 03/10/2018 1108   HDL 71 03/10/2018 1108   CHOLHDL 2.2 03/10/2018 1108   CHOLHDL 2 11/13/2017 1127   VLDL 13.2 11/13/2017 1127   LDLCALC 73 03/10/2018 1108      Wt Readings from Last 3 Encounters:  05/12/19 157 lb 9.6 oz (71.5 kg)  04/26/19 160 lb 4.4 oz (72.7 kg)  04/01/19 157 lb (71.2 kg)      Other studies Reviewed: Echocardiogram 02/10/2019  1. The left ventricle has normal systolic function with an ejection fraction of 60-65%. The cavity size was normal. Left ventricular diastolic  parameters were normal.  2. The right ventricle has normal systolic function. The cavity was normal. There is  no increase in right ventricular wall thickness.  3. No evidence present in the left atrial appendage.  4. Mild thickening of the mitral valve leaflet.  5. Post TAVR with 26 mm Medtronic Core Valve Mean gradient low and stable since echo done 12/31/18 and no significant PVL.  6. No pulmonic valve vegetation visualized.  ASSESSMENT AND PLAN:  1.  Atrial fibrillation: One episode while in the ED in the setting of acute cholecystitis and abdominal pain, likely related to physiological stress in the setting of illness.  The patient has had no further episodes of atrial fibrillation, but did have some atrial tachycardia during hospitalization in late June 2020.  I will not start her on anticoagulation without documented atrial fibrillation which is been recurrent.  She is to have a 2-week ZIO cardiac monitor placed for further evaluation of atrial arrhythmias and/or tachycardia.  For now she will remain on metoprolol XL 25 mg daily.  If she has any signs of atrial fib during her cardiac monitor duration we will, of course, begin her on anticoagulation.  2.  Hypertension: Blood pressure is normal today.  I will not restart amlodipine or benazepril at this time  3.  Status post TAVR: Completed on 01/16/2019.  She is scheduled for repeat echocardiogram in March 2021.Marland Kitchen  She will need SBE prophylaxis.  4.  Status post acute ischemia of the left leg: Noted postoperatively from TAVR requiring surgical intervention and emergent repair of the left femoral artery with endarterectomy and Dacron patch angioplasty.  She is doing well and without any pain.  Incision of the left groin is well-healed.  5.  Status post cholecystectomy: Has recovered well has no further abdominal pain nausea or vomiting.  Current medicines are reviewed at length with the patient today.    Labs/ tests ordered today include: ZIO  2-week cardiac monitor  Phill Myron. [place] Pugh, ANP, AACC   05/12/2019 3:12 PM    [facility] [address] Office [phone] Fax [phone]

## 2019-05-12 ENCOUNTER — Other Ambulatory Visit: Payer: Self-pay

## 2019-05-12 ENCOUNTER — Ambulatory Visit (INDEPENDENT_AMBULATORY_CARE_PROVIDER_SITE_OTHER): Payer: Medicare Other | Admitting: Adult Health

## 2019-05-12 ENCOUNTER — Encounter: Payer: Self-pay | Admitting: Adult Health

## 2019-05-12 VITALS — BP 122/80 | HR 85 | Temp 98.4°F | Wt 157.6 lb

## 2019-05-12 DIAGNOSIS — I2510 Atherosclerotic heart disease of native coronary artery without angina pectoris: Secondary | ICD-10-CM

## 2019-05-12 DIAGNOSIS — Z952 Presence of prosthetic heart valve: Secondary | ICD-10-CM | POA: Diagnosis not present

## 2019-05-12 DIAGNOSIS — I480 Paroxysmal atrial fibrillation: Secondary | ICD-10-CM

## 2019-05-12 DIAGNOSIS — I10 Essential (primary) hypertension: Secondary | ICD-10-CM | POA: Diagnosis not present

## 2019-05-12 DIAGNOSIS — I471 Supraventricular tachycardia: Secondary | ICD-10-CM | POA: Diagnosis not present

## 2019-05-12 NOTE — Patient Instructions (Addendum)
Medication Instructions:  Continue current medications  If you need a refill on your cardiac medications before your next appointment, please call your pharmacy.  Labwork: None Ordered  Testing/Procedures: Your physician has recommended that you wear a Zio Patch monitor for 2 weeks. Zio Patch monitors are medical devices that record the heart's electrical activity. Doctors most often use these monitors to diagnose arrhythmias. Arrhythmias are problems with the speed or rhythm of the heartbeat. The monitor is a small, portable device. You can wear one while you do your normal daily activities. This is usually used to diagnose what is causing palpitations/syncope (passing out).  Follow-Up: . Your physician recommends that you schedule a follow-up appointment in: 1 Month Monday August 17th @ 2:15 pm   At [facility], you and your health needs are our priority.  As part of our continuing mission to provide you with exceptional heart care, we have created designated Provider Care Teams.  These Care Teams include your primary Cardiologist (physician) and Advanced Practice Providers (APPs -  Physician Assistants and Nurse Practitioners) who all work together to provide you with the care you need, when you need it.  Thank you for choosing [facility] at [facility]!!

## 2019-05-14 ENCOUNTER — Telehealth (HOSPITAL_COMMUNITY): Payer: Self-pay | Admitting: *Deleted

## 2019-05-14 ENCOUNTER — Telehealth: Payer: Self-pay | Admitting: *Deleted

## 2019-05-14 NOTE — Telephone Encounter (Signed)
14 day ZIO XT long term holter monitor to be mailed to the patients home.  Instructions reviewed briefly as the are included in the monitor kit.

## 2019-05-14 NOTE — Telephone Encounter (Signed)
The above patient or their representative was contacted and gave the following answers to these questions:         Do you have any of the following symptoms?n  Fever                    Cough                   Shortness of breath  Do  you have any of the following other symptoms? n   muscle pain         vomiting,        diarrhea        rash         weakness        red eye        abdominal pain         bruising          bruising or bleeding              joint pain           severe headache    Have you been in contact with someone who was or has been sick in the past 2 weeks?n  Yes                 Unsure                         Unable to assess   Does the person that you were in contact with have any of the following symptoms?   Cough         shortness of breath           muscle pain         vomiting,            diarrhea            rash            weakness           fever            red eye           abdominal pain           bruising  or  bleeding                joint pain                severe headache               Have you  or someone you have been in contact with traveled internationally in th last month?   n      If yes, which countries?   Have you  or someone you have been in contact with traveled outside [place] in th last month?   n      If yes, which state and city?   COMMENTS OR ACTION PLAN FOR THIS PATIENT:         

## 2019-05-15 ENCOUNTER — Encounter: Payer: Self-pay | Admitting: Family

## 2019-05-15 ENCOUNTER — Ambulatory Visit (INDEPENDENT_AMBULATORY_CARE_PROVIDER_SITE_OTHER): Payer: Medicare Other | Admitting: Family

## 2019-05-15 ENCOUNTER — Ambulatory Visit (HOSPITAL_COMMUNITY)
Admission: RE | Admit: 2019-05-15 | Discharge: 2019-05-15 | Disposition: A | Discharge status: Home / Self Care | Payer: Medicare Other | Source: Ambulatory Visit | Attending: Family | Admitting: Family

## 2019-05-15 ENCOUNTER — Other Ambulatory Visit: Payer: Self-pay

## 2019-05-15 VITALS — BP 159/81 | HR 78 | Temp 98.1°F | Resp 12 | Ht 66.0 in | Wt 159.3 lb

## 2019-05-15 DIAGNOSIS — S75002D Unspecified injury of femoral artery, left leg, subsequent encounter: Secondary | ICD-10-CM | POA: Diagnosis not present

## 2019-05-15 DIAGNOSIS — I2510 Atherosclerotic heart disease of native coronary artery without angina pectoris: Secondary | ICD-10-CM | POA: Diagnosis not present

## 2019-05-15 DIAGNOSIS — Z8679 Personal history of other diseases of the circulatory system: Secondary | ICD-10-CM | POA: Diagnosis not present

## 2019-05-15 DIAGNOSIS — Z9889 Other specified postprocedural states: Secondary | ICD-10-CM | POA: Diagnosis not present

## 2019-05-15 NOTE — Progress Notes (Signed)
[facility] [place]   CC: Follow up peripheral artery occlusive disease  History of Present Illness ISHANI GOLDWASSER is a 78 y.o. female who is s/p transfemoral aortic valve replacement by Dr. [name] on 12-30-18. She then required a repair of left femoral artery with endarterectomy and Dacron patch angioplasty on 12-31-18 by Dr. [name] for Ischemic left leg following the TAVR.   She returns today for routine follow up.   Pt states she felt much improved after the TAVR, more energy, no shortness of breath.   She denies any pain or weakness in the muscles of her legs with walking, she denies any history of peripheral artery disease.   She denies any known hx of stroke or TIA.   Diabetic: No Tobacco use: former smoker, quit in 2005, smoked x 40 years   Pt meds include: Statin :Yes Betablocker: yes ASA: Yes Other anticoagulants/antiplatelets: no  Past Medical History:  Diagnosis Date  . Atrial fibrillation ([place])    a. dx 03/2019 while in hospital with cholecystitis  . COPD (chronic obstructive pulmonary disease) ([place])   . Critical lower limb ischemia    a. after TAVR developed ischemic L leg likely due to flap/closure, s/p emergent repair of left femoral artery with endarterectomy and Dacron patch angioplasty.  . Emphysema lung ([place])   . Hyperlipidemia    LDL goal < 70  . Hypertension   . Hypomagnesemia   . Normal coronary arteries 10/2018  . NSVT (nonsustained ventricular tachycardia) ([place]) 03/02/2019  . Pulmonary HTN ([place]) 06/05/2016   Moderate with PASP 39mmHg by echo 04/2016 likely Group 3 from COPD and possibly Group 2 from pulmonary venous HTN associated with moderate AS - f/u echo 12/2018 post TAVR showed normal RVSP  . Pulmonary nodules    seen on pre TAVR CT, needs 12 month CT follow up  . 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 Sacral  fracture ([place])   . Severe aortic stenosis    a. s/p TAVR 12/2018.  Marland Kitchen Thoracic ascending aortic aneurysm [facility])    needs yearly follow up    Social History Social History   Tobacco Use  . Smoking status: Former Smoker    Packs/day: 1.00    Years: 40.00    Pack years: 40.00    Types: Cigarettes    Quit date: 03/19/2004    Years since quitting: 15.1  . Smokeless tobacco: Never Used  Substance Use Topics  . Alcohol use: Yes    Alcohol/week: 3.0 - 4.0 standard drinks    Types: 3 - 4 Glasses of wine per week    Comment: socially  . Drug use: No    Family History Family History  Problem Relation Age of Onset  . Breast cancer Mother        breast  . Diabetes Son        type 1  . Cancer Brother        lung  . Colon cancer Neg Hx   . CAD Neg Hx   . Colon polyps Neg Hx   . Rectal cancer Neg Hx   . Stomach cancer Neg Hx     Past Surgical History:  Procedure Laterality Date  . APPENDECTOMY  1951  . CATARACT EXTRACTION    . CESAREAN SECTION     1971  . CHOLECYSTECTOMY N/A 04/28/2019   Procedure: LAPAROSCOPIC CHOLECYSTECTOMY;  Surgeon: Stark Klein, MD;  [facility];  Service: General;  Laterality: N/A;  . COLONOSCOPY  2018  . FEMORAL-POPLITEAL BYPASS GRAFT Left 12/31/2018   Procedure: PATCH ANGIOPLASTY REPAIR LEFT FEMORAL ARTERY USING HEMASHIELD PLATINUM FINESSE PATCH;  Surgeon: Rosetta Posner, MD;  Location: [place];  Service: Vascular;  Laterality: Left;  . IR RADIOLOGY PERIPHERAL GUIDED IV START  05/30/2017  . IR RADIOLOGY PERIPHERAL GUIDED IV START  09/02/2018  . IR RADIOLOGY PERIPHERAL GUIDED IV START  09/02/2018  . IR US GUIDE VASC ACCESS LEFT  09/02/2018  . IR US GUIDE VASC ACCESS RIGHT  05/30/2017  . IR US GUIDE VASC ACCESS RIGHT  09/02/2018  . JOINT REPLACEMENT  2016   sacroplasty  . LAMINECTOMY    . RIGHT/LEFT HEART CATH AND CORONARY ANGIOGRAPHY N/A 11/27/2018   Procedure: RIGHT/LEFT HEART CATH AND CORONARY ANGIOGRAPHY;  Surgeon: Sherren Mocha, MD;  Location: [place] [facility];  Service: Cardiovascular;  Laterality: N/A;  . TEE WITHOUT CARDIOVERSION N/A 12/30/2018   Procedure: TRANSESOPHAGEAL ECHOCARDIOGRAM (TEE);  Surgeon: Sherren Mocha, MD;  Location: [facility] [facility];  Service: Open Heart Surgery;  Laterality: N/A;  . TOTAL HIP ARTHROPLASTY Bilateral   . TRANSCATHETER AORTIC VALVE REPLACEMENT, TRANSFEMORAL N/A 12/30/2018   Procedure: TRANSCATHETER AORTIC VALVE REPLACEMENT, TRANSFEMORAL;  Surgeon: Sherren Mocha, MD;  Location: [place] [facility];  Service: Open Heart Surgery;  Laterality: N/A;  . TUBAL LIGATION  1975    No Known Allergies  Current Outpatient Medications  Medication Sig Dispense Refill  . acetaminophen (TYLENOL) 325 MG tablet Take 2 tablets (650 mg total) by mouth every 6 (six) hours as needed for mild pain (or Fever >/= 101).    Marland Kitchen albuterol (PROVENTIL HFA;VENTOLIN HFA) 108 (90 Base) MCG/ACT inhaler Inhale 2 puffs into the lungs every 6 (six) hours as needed for wheezing.    Marland Kitchen aspirin EC 81 MG tablet Take 81 mg by mouth daily.    . Calcium Carb-Cholecalciferol (CALCIUM 600 + D PO) Take 600 mg by mouth 3 (three) times a week.     . Cholecalciferol (VITAMIN D3) 5000 UNITS CAPS Take 5,000 Units by mouth daily.     Marland Kitchen denosumab (PROLIA) 60 MG/ML SOLN injection Inject 60 mg into the skin every 6 (six) months. Administer in upper arm, thigh, or abdomen    . EPINEPHrine 0.3 mg/0.3 mL IJ SOAJ injection Inject 0.3 mg into the muscle as needed for anaphylaxis.   12  . loratadine (CLARITIN) 10 MG tablet Take 10 mg by mouth daily as needed for allergies.     . magnesium oxide (MAG-OX) 400 (241.3 Mg) MG tablet Take 1 tablet (400 mg total) by mouth daily. 90 tablet 1  . metoprolol succinate (TOPROL XL) 25 MG 24 hr tablet Take 1 tablet (25 mg total) by mouth daily. 90 tablet 1  . Multiple Vitamin (MULTIVITAMIN WITH MINERALS) TABS tablet Take 1 tablet by mouth daily.    . ondansetron (ZOFRAN ODT) 4 MG disintegrating tablet Take 1 tablet (4 mg  total) by mouth every 8 (eight) hours as needed for nausea or vomiting. 20 tablet 0  . rosuvastatin (CRESTOR) 40 MG tablet TAKE 1 TABLET BY MOUTH EVERY DAY 90 tablet 3   No current facility-administered medications for this visit.     ROS: See HPI for pertinent positives and negatives.   Physical Examination  Vitals:   05/15/19 1137  BP: (!) 159/81  Pulse: 78  Resp: 12  Temp: 98.1 F (36.7 C)  TempSrc: Temporal  SpO2: 99%  Weight: 159 lb 4.8 oz (72.3 kg)  Height: 5\' 6"  (1.676 m)   Body mass index is 25.71 kg/m.  General: A&O x 3, WDWN, fit appearing elderly female. Gait: normal HEENT: No gross abnormalities.  Pulmonary: Respirations are non labored, CTAB, good air movement in all fields Cardiac: regular rhythm, no detected murmur.         Carotid Bruits Right Left   Negative Negative   Radial pulses are 2+ palpable bilaterally   Adominal aortic pulse is not palpable                         VASCULAR EXAM: Extremities without ischemic changes, without Gangrene; without open wounds. Mild to moderate scar tissue left groin, no swelling, no pain.                                                                                                          LE Pulses Right Left       FEMORAL  2+ palpable  2+ palpable        POPLITEAL  not palpable   not palpable       POSTERIOR TIBIAL  2+ palpable   2+ palpable        DORSALIS PEDIS      ANTERIOR TIBIAL not palpable  not palpable    Abdomen: soft, NT, no palpable masses. Skin: no rashes, no cellulitis, no ulcers noted. Musculoskeletal: no muscle wasting or atrophy.  Neurologic: A&O X 3; appropriate affect, Sensation is normal; MOTOR FUNCTION:  moving all extremities equally, motor strength 5/5 throughout. Speech is fluent/normal. CN 2-12 intact. Psychiatric: Thought content is normal, mood appropriate for clinical situation.    DATA  ABI (Date: 05/15/2019): ABI  Findings: +---------+------------------+-----+---------+--------+ Right    Rt Pressure (mmHg)IndexWaveform Comment  +---------+------------------+-----+---------+--------+ Brachial 166                                      +---------+------------------+-----+---------+--------+ PTA      197               1.17 triphasic         +---------+------------------+-----+---------+--------+ DP       188               1.12 triphasic         +---------+------------------+-----+---------+--------+ Great Toe127               0.76 Normal            +---------+------------------+-----+---------+--------+  +---------+------------------+-----+---------+-------+ Left     Lt Pressure (mmHg)IndexWaveform Comment +---------+------------------+-----+---------+-------+ Brachial 168                                     +---------+------------------+-----+---------+-------+ PTA      204  1.21 triphasic        +---------+------------------+-----+---------+-------+ DP       197               1.17 triphasic        +---------+------------------+-----+---------+-------+ Great Toe163               0.97 Normal           +---------+------------------+-----+---------+-------+  +-------+-----------+-----------+------------+------------+ ABI/TBIToday's ABIToday's TBIPrevious ABIPrevious TBI +-------+-----------+-----------+------------+------------+ Right  1.17       0.76                                +-------+-----------+-----------+------------+------------+ Left   1.21       0.97                                +-------+-----------+-----------+------------+------------+   Summary: Right: Resting right ankle-brachial index is within normal range. No evidence of significant right lower extremity arterial disease. The right toe-brachial index is normal.  Left: Resting left ankle-brachial index is within normal range. No  evidence of significant left lower extremity arterial disease. The left toe-brachial index is normal.   ASSESSMENT: ANSELMA HERBEL is a 78 y.o. female who  is s/p transfemoral aortic valve replacement by Dr. [name] on 12-30-18. She then required a repair of left femoral artery with endarterectomy and Dacron patch angioplasty on 12-31-18 by Dr. [name] for Ischemic left leg following the TAVR.   She has no claudication symptoms with walking, is quite active.  Pt states she felt much improved after the TAVR, more energy, no shortness of breath.    Arterial duplex of left leg in March 2020 showed no stenosis. ABI's today are normal bilaterally with all triphasic waveforms.   Bilateral PT pulses are 2+ palpable. No signs of ischemia in either lower extremity.   She takes a daily ASA and a statin.   PLAN:  Based on the patient's vascular studies, HPI, and examination, pt will return to clinic on an as needed basis.   Clemon Chambers, RN, MSN, FNP-C [facility] Phone: [phone]  Clinic MD: Donzetta Matters  05/15/19 12:14 PM

## 2019-05-21 ENCOUNTER — Ambulatory Visit (INDEPENDENT_AMBULATORY_CARE_PROVIDER_SITE_OTHER): Payer: Medicare Other

## 2019-05-21 DIAGNOSIS — I480 Paroxysmal atrial fibrillation: Secondary | ICD-10-CM

## 2019-05-21 NOTE — Progress Notes (Addendum)
Virtual Visit via Video Note  I connected with patient on 05/25/19 at 11:00 AM EDT by audio enabled telemedicine application and verified that I am speaking with the correct person using two identifiers.   THIS ENCOUNTER IS A VIRTUAL VISIT DUE TO COVID-19 - PATIENT WAS NOT SEEN IN THE OFFICE. PATIENT HAS CONSENTED TO VIRTUAL VISIT / TELEMEDICINE VISIT   Location of patient: home  Location of provider: office  I discussed the limitations of evaluation and management by telemedicine and the availability of in person appointments. The patient expressed understanding and agreed to proceed.   Subjective:   Dominique [name] is a 78 y.o. female who presents for Medicare Annual (Subsequent) preventive examination.  Review of Systems: No ROS.  Medicare Wellness Virtual Visit.  Visual/audio telehealth visit, UTA vital signs.   See social history for additional risk factors. Cardiac Risk Factors include: advanced age (>26men, >70 women);dyslipidemia;hypertension Sleep patterns:  No issues Home Safety/Smoke Alarms: Feels safe in home. Smoke alarms in place.  Lives alone with dog. 1 level. Son and dtr n law check on her often.   Female:    Mammo-  05/14/18     Dexa scan-  Active order      CCS-11/09/16.     Objective:     Vitals: BP (!) 148/79 Comment: pt reported vitals  Pulse 73    Advanced Directives 05/25/2019 04/26/2019 12/30/2018 12/26/2018 12/11/2018 11/27/2018 07/27/2016  Does Patient Have a Medical Advance Directive? Yes Yes Yes Yes Yes Yes Yes  Type of Paramedic of [place];Living will [place];Living will [place];Living will [place];Living will Living will;Healthcare Power of Attorney Living will Living will;Healthcare Power of Attorney  Does patient want to make changes to medical advance directive? No - Patient declined No - Patient declined No - Patient declined - - No - Patient declined -  Copy of  [place] in Chart? Yes - validated most recent copy scanned in chart (See row information) No - copy requested Yes - validated most recent copy scanned in chart (See row information) Yes - validated most recent copy scanned in chart (See row information) - - -  Would patient like information on creating a medical advance directive? - No - Patient declined - - - - -    Tobacco Social History   Tobacco Use  Smoking Status Former Smoker  . Packs/day: 1.00  . Years: 40.00  . Pack years: 40.00  . Types: Cigarettes  . Quit date: 03/19/2004  . Years since quitting: 15.1  Smokeless Tobacco Never Used     Counseling given: Not Answered   Clinical Intake: Pain : No/denies pain   Past Medical History:  Diagnosis Date  . Atrial fibrillation ([place])    a. dx 03/2019 while in hospital with cholecystitis  . COPD (chronic obstructive pulmonary disease) ([place])   . Critical lower limb ischemia    a. after TAVR developed ischemic L leg likely due to flap/closure, s/p emergent repair of left femoral artery with endarterectomy and Dacron patch angioplasty.  . Emphysema lung ([place])   . Hyperlipidemia    LDL goal < 70  . Hypertension   . Hypomagnesemia   . Normal coronary arteries 10/2018  . NSVT (nonsustained ventricular tachycardia) ([place]) 03/02/2019  . Pulmonary HTN ([place]) 06/05/2016   Moderate with PASP 58mmHg by echo 04/2016 likely Group 3 from COPD and possibly Group 2 from pulmonary venous HTN associated with moderate AS -  f/u echo 12/2018 post TAVR showed normal RVSP  . Pulmonary nodules    seen on pre TAVR CT, needs 12 month CT follow up  . 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 Sacral fracture ([place])   . Severe aortic stenosis    a. s/p TAVR 12/2018.  Marland Kitchen Thoracic ascending aortic aneurysm [facility])    needs yearly follow up   Past Surgical History:  Procedure Laterality  Date  . APPENDECTOMY  1951  . CATARACT EXTRACTION    . CESAREAN SECTION     1971  . CHOLECYSTECTOMY N/A 04/28/2019   Procedure: LAPAROSCOPIC CHOLECYSTECTOMY;  Surgeon: Stark Klein, MD;  [facility];  Service: General;  Laterality: N/A;  . COLONOSCOPY  2018  . FEMORAL-POPLITEAL BYPASS GRAFT Left 12/31/2018   Procedure: PATCH ANGIOPLASTY REPAIR LEFT FEMORAL ARTERY USING HEMASHIELD PLATINUM FINESSE PATCH;  Surgeon: Rosetta Posner, MD;  Location: [place];  Service: Vascular;  Laterality: Left;  . IR RADIOLOGY PERIPHERAL GUIDED IV START  05/30/2017  . IR RADIOLOGY PERIPHERAL GUIDED IV START  09/02/2018  . IR RADIOLOGY PERIPHERAL GUIDED IV START  09/02/2018  . IR US GUIDE VASC ACCESS LEFT  09/02/2018  . IR US GUIDE VASC ACCESS RIGHT  05/30/2017  . IR US GUIDE VASC ACCESS RIGHT  09/02/2018  . JOINT REPLACEMENT  2016   sacroplasty  . LAMINECTOMY    . RIGHT/LEFT HEART CATH AND CORONARY ANGIOGRAPHY N/A 11/27/2018   Procedure: RIGHT/LEFT HEART CATH AND CORONARY ANGIOGRAPHY;  Surgeon: Sherren Mocha, MD;  Location: [place] [facility];  Service: Cardiovascular;  Laterality: N/A;  . TEE WITHOUT CARDIOVERSION N/A 12/30/2018   Procedure: TRANSESOPHAGEAL ECHOCARDIOGRAM (TEE);  Surgeon: Sherren Mocha, MD;  Location: [place] [facility];  Service: Open Heart Surgery;  Laterality: N/A;  . TOTAL HIP ARTHROPLASTY Bilateral   . TRANSCATHETER AORTIC VALVE REPLACEMENT, TRANSFEMORAL N/A 12/30/2018   Procedure: TRANSCATHETER AORTIC VALVE REPLACEMENT, TRANSFEMORAL;  Surgeon: Sherren Mocha, MD;  Location: [place] [facility];  Service: Open Heart Surgery;  Laterality: N/A;  . TUBAL LIGATION  1975   Family History  Problem Relation Age of Onset  . Breast cancer Mother        breast  . Diabetes Son        type 1  . Cancer Brother        lung  . Colon cancer Neg Hx   . CAD Neg Hx   . Colon polyps Neg Hx   . Rectal cancer Neg Hx   . Stomach cancer Neg Hx    Social History   Socioeconomic History  . Marital  status: Widowed    Spouse name: Not on file  . Number of children: 2  . Years of education: Not on file  . Highest education level: Not on file  Occupational History  . Occupation: n/a  Social Needs  . Financial resource strain: Not on file  . Food insecurity    Worry: Not on file    Inability: Not on file  . Transportation needs    Medical: Not on file    Non-medical: Not on file  Tobacco Use  . Smoking status: Former Smoker    Packs/day: 1.00    Years: 40.00    Pack years: 40.00    Types: Cigarettes    Quit date: 03/19/2004    Years since quitting: 15.1  . Smokeless tobacco: Never Used  Substance and Sexual Activity  . Alcohol use: Yes    Alcohol/week: 3.0 - 4.0 standard drinks    Types: 3 - 4 Glasses of wine per week    Comment: socially  . Drug use: No  . Sexual activity: Not on file  Lifestyle  . Physical activity    Days per week: Not on file    Minutes per session: Not on file  . Stress: Not on file  Relationships  . Social Herbalist on phone: Not on file    Gets together: Not on file    Attends religious service: Not on file    Active member of club or organization: Not on file    Attends meetings of clubs or organizations: Not on file    Relationship status: Not on file  Other Topics Concern  . Not on file  Social History Narrative   From [place]   1 living son    Outpatient Encounter Medications as of 05/25/2019  Medication Sig  . acetaminophen (TYLENOL) 325 MG tablet Take 2 tablets (650 mg total) by mouth every 6 (six) hours as needed for mild pain (or Fever >/= 101).  Marland Kitchen albuterol (PROVENTIL HFA;VENTOLIN HFA) 108 (90 Base) MCG/ACT inhaler Inhale 2 puffs into the lungs every 6 (six) hours as needed for wheezing.  Marland Kitchen aspirin EC 81 MG tablet Take 81 mg by mouth daily.  . Calcium Carb-Cholecalciferol (CALCIUM 600 + D PO) Take 600 mg by mouth 3 (three) times a week.   . Cholecalciferol (VITAMIN D3) 5000 UNITS CAPS Take 5,000 Units by mouth daily.    Marland Kitchen denosumab (PROLIA) 60 MG/ML SOLN injection Inject 60 mg into the skin every 6 (six) months. Administer in upper arm, thigh, or abdomen  . EPINEPHrine 0.3 mg/0.3 mL IJ SOAJ injection Inject 0.3 mg into the muscle as needed for anaphylaxis.   Marland Kitchen loratadine (CLARITIN) 10 MG tablet Take 10 mg by mouth daily as needed for allergies.   . magnesium oxide (MAG-OX) 400 (241.3 Mg) MG tablet Take 1 tablet (400 mg total) by mouth daily.  . metoprolol succinate (TOPROL XL) 25 MG 24 hr tablet Take 1 tablet (25 mg total) by mouth daily.  . Multiple Vitamin (MULTIVITAMIN WITH MINERALS) TABS tablet Take 1 tablet by mouth daily.  . ondansetron (ZOFRAN ODT) 4 MG disintegrating tablet Take 1 tablet (4 mg total) by mouth every 8 (eight) hours as needed for nausea or vomiting.  . rosuvastatin (CRESTOR) 40 MG tablet TAKE 1 TABLET BY MOUTH EVERY DAY   No facility-administered encounter medications on file as of 05/25/2019.     Activities of Daily Living In your present state of health, do you have any difficulty performing the following activities: 05/25/2019 04/26/2019  Hearing? N N  Vision? N N  Difficulty concentrating or making decisions? N N  Walking or climbing stairs? N Y  Dressing or bathing? N N  Doing errands, shopping? N N  Preparing Food and eating ? N -  Using the Toilet? N -  In the past six months, have you accidently leaked urine? N -  Do you have problems with loss of bowel control? N -  Managing your Medications? N -  Managing your Finances? N -  Housekeeping or managing your Housekeeping? N -  Some recent data might be hidden    Patient Care Team: Colon Branch, MD as PCP - General (Internal Medicine) [name], MD as PCP - Cardiology (Cardiology) Dr Selena Batten and  Ankle as Consulting Physician Scientist, forensic) [facility] (Orthopedic Surgery) Linda Hedges, DO as Consulting Physician (Obstetrics and Gynecology)    Assessment:   This is a routine  wellness examination for [place]. Physical assessment deferred to PCP.  Exercise Activities and Dietary recommendations Current Exercise Habits: The patient does not participate in regular exercise at present, Exercise limited by: None identified   Diet (meal preparation, eat out, water intake, caffeinated beverages, dairy products, fruits and vegetables): 24 hr recall Breakfast:rice and beans Lunch-skipped Dinner: rice and beans  Goals    . Increase physical activity       Fall Risk Fall Risk  05/25/2019 11/13/2017 10/03/2017 10/15/2016 10/14/2015  Falls in the past year? 0 No No No No  Comment - - Emmi Telephone Survey: data to providers prior to load - -     Depression Screen PHQ 2/9 Scores 05/25/2019 11/13/2017 10/15/2016 10/14/2015  PHQ - 2 Score 0 0 0 0     Cognitive Function Ad8 score reviewed for issues:  Issues making decisions:no  Less interest in hobbies / activities:no  Repeats questions, stories (family complaining):no  Trouble using ordinary gadgets (microwave, computer, phone):no  Forgets the month or year: no  Mismanaging finances: no  Remembering appts:no Daily problems with thinking and/or memory:no Ad8 score is=0         Immunization History  Administered Date(s) Administered  . Influenza, High Dose Seasonal PF 10/03/2016, 08/07/2018  . Influenza-Unspecified 09/06/2015, 07/18/2017  . Pneumococcal Conjugate-13 09/27/2014  . Pneumococcal Polysaccharide-23 10/29/2010  . Td 04/24/2016  . Zoster Recombinat (Shingrix) 10/18/2017, 03/04/2018    Screening Tests Health Maintenance  Topic Date Due  . DEXA SCAN  09/01/2017  . MAMMOGRAM  05/15/2019  . INFLUENZA VACCINE  05/30/2019  . TETANUS/TDAP  04/24/2026  . PNA vac Low Risk Adult  Completed      Plan:    Pt c/o headaches daily that last for about 5 min, occasionally with vision changes, then go away. Pt checks BP daily at home and states it has been pretty consistent. Reports today 148/79. PCP  made aware and pt scheduled to come in at 120pm. Pt instructed to call 911 or go to ER if symptoms change or get worse. Pt denies any s/sx right now.  See you next year!  Continue to eat heart healthy diet (full of fruits, vegetables, whole grains, lean protein, water--limit salt, fat, and sugar intake) and increase physical activity as tolerated.  Continue doing brain stimulating activities (puzzles, reading, adult coloring books, staying active) to keep memory sharp.     I have personally reviewed and noted the following in the patient's chart:   . Medical and social history . Use of alcohol, tobacco or illicit drugs  . Current medications and supplements . Functional ability and status . Nutritional status . Physical activity . Advanced directives . List of other physicians . Hospitalizations, surgeries, and ER visits in previous 12 months . Vitals . Screenings to include cognitive, depression, and falls . Referrals and appointments  In addition, I have reviewed and discussed with patient certain preventive protocols, quality metrics, and best practice recommendations. A written personalized care plan for preventive services as well as general preventive health recommendations were provided to patient.     Naaman Plummer [place], [place]  05/25/2019  Kathlene November, MD

## 2019-05-23 ENCOUNTER — Other Ambulatory Visit: Payer: Self-pay | Admitting: Internal Medicine

## 2019-05-25 ENCOUNTER — Ambulatory Visit (HOSPITAL_BASED_OUTPATIENT_CLINIC_OR_DEPARTMENT_OTHER)
Admission: RE | Admit: 2019-05-25 | Discharge: 2019-05-25 | Disposition: A | Discharge status: Home / Self Care | Payer: Medicare Other | Source: Ambulatory Visit | Attending: Internal Medicine | Admitting: Internal Medicine

## 2019-05-25 ENCOUNTER — Other Ambulatory Visit: Payer: Self-pay

## 2019-05-25 ENCOUNTER — Encounter: Payer: Self-pay | Admitting: *Deleted

## 2019-05-25 ENCOUNTER — Encounter: Payer: Self-pay | Admitting: Internal Medicine

## 2019-05-25 ENCOUNTER — Ambulatory Visit (INDEPENDENT_AMBULATORY_CARE_PROVIDER_SITE_OTHER): Payer: Medicare Other | Admitting: Internal Medicine

## 2019-05-25 ENCOUNTER — Ambulatory Visit (INDEPENDENT_AMBULATORY_CARE_PROVIDER_SITE_OTHER): Payer: Medicare Other | Admitting: *Deleted

## 2019-05-25 VITALS — BP 153/74 | HR 71 | Temp 97.8°F | Resp 18 | Ht 66.0 in | Wt 149.2 lb

## 2019-05-25 VITALS — BP 148/79 | HR 73

## 2019-05-25 DIAGNOSIS — G459 Transient cerebral ischemic attack, unspecified: Secondary | ICD-10-CM | POA: Insufficient documentation

## 2019-05-25 DIAGNOSIS — Z0000 Encounter for general adult medical examination without abnormal findings: Secondary | ICD-10-CM

## 2019-05-25 DIAGNOSIS — I2510 Atherosclerotic heart disease of native coronary artery without angina pectoris: Secondary | ICD-10-CM

## 2019-05-25 DIAGNOSIS — G4452 New daily persistent headache (NDPH): Secondary | ICD-10-CM

## 2019-05-25 DIAGNOSIS — I10 Essential (primary) hypertension: Secondary | ICD-10-CM | POA: Diagnosis not present

## 2019-05-25 DIAGNOSIS — R51 Headache: Secondary | ICD-10-CM | POA: Diagnosis not present

## 2019-05-25 DIAGNOSIS — I480 Paroxysmal atrial fibrillation: Secondary | ICD-10-CM | POA: Diagnosis not present

## 2019-05-25 MED ORDER — AMLODIPINE BESYLATE 2.5 MG PO TABS: 2.5000 mg | ORAL_TABLET | Freq: Every day | ORAL | 0 refills | Status: DC

## 2019-05-25 NOTE — Progress Notes (Signed)
Pre visit review using our clinic review tool, if applicable. No additional management support is needed unless otherwise documented below in the visit note. 

## 2019-05-25 NOTE — Telephone Encounter (Signed)
Per cardiology note 05/12/2019, she was only on metoprolol, no plans to restart medications unless BP is elevated. Advise patient: No need for amlodipine unless BP is more 130s/80s.

## 2019-05-25 NOTE — Patient Instructions (Signed)
  GO TO THE FRONT DESK Schedule your next appointment   for a checkup in 4 weeks   STOP BY THE FIRST FLOOR:  get a CAT scan of the head  Add amlodipine 2.5 mg 1 tablet daily to get better blood pressure control  We are arranging for a carotid ultrasound and neurologist evaluation   Check the  blood pressure every day BP GOAL is between 110/65 and  135/85.

## 2019-05-25 NOTE — Progress Notes (Signed)
Subjective:    Patient ID: Dominique [name], female    DOB: 13-Sep-1941, 78 y.o.   MRN: [id number]  DOS:  05/25/2019 Type of visit - description: Acute visit, headache. Chart reviewed: Since last office visit, was admitted to the hospital 04/26/2019 discharge 3 days later, she was found to have acute cholecystitis and had surgery. She also was diagnosed with paroxysmal A. fib with RVR when she was up emergency room.  She converted to NSR, cardiology consulted, was discharged on aspirin ; Plavix was d/c before d/c   Today, she reports that since she was discharged from the hospital she is experiencing headaches. Episodes are daily , intensity 4/10, it can be at rest or right-sided, + responds  to Tylenol. No associated nausea, photo or phonophobia.  Also, long history of blurry vision from time to time. Since she left the hospital, she is having blurred vision every day and associated with diplopia. Episodes last 5 to 10 minutes The patient also states sxs are better after she takes caffeine. The blurred vision is in the whole visual field. Visual disturbances nitially    associated with headache but now happens independently.   Review of Systems No fever chills no weight loss.  No unusual aches or pains No chest pain no difficulty breathing. Occasional palpitations Denies dizziness, slurred speech. No neck stiffness  Past Medical History:  Diagnosis Date  . Atrial fibrillation ([place])    a. dx 03/2019 while in hospital with cholecystitis  . COPD (chronic obstructive pulmonary disease) ([place])   . Critical lower limb ischemia    a. after TAVR developed ischemic L leg likely due to flap/closure, s/p emergent repair of left femoral artery with endarterectomy and Dacron patch angioplasty.  . Emphysema lung ([place])   . Hyperlipidemia    LDL goal < 70  . Hypertension   . Hypomagnesemia   . Normal coronary arteries 10/2018  . NSVT (nonsustained ventricular tachycardia) ([place]) 03/02/2019  .  Pulmonary HTN ([place]) 06/05/2016   Moderate with PASP 30mmHg by echo 04/2016 likely Group 3 from COPD and possibly Group 2 from pulmonary venous HTN associated with moderate AS - f/u echo 12/2018 post TAVR showed normal RVSP  . Pulmonary nodules    seen on pre TAVR CT, needs 12 month CT follow up  . 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 Sacral fracture ([place])   . Severe aortic stenosis    a. s/p TAVR 12/2018.  Marland Kitchen Thoracic ascending aortic aneurysm [facility])    needs yearly follow up    Past Surgical History:  Procedure Laterality Date  . APPENDECTOMY  1951  . CATARACT EXTRACTION    . CESAREAN SECTION     1971  . CHOLECYSTECTOMY N/A 04/28/2019   Procedure: LAPAROSCOPIC CHOLECYSTECTOMY;  Surgeon: Stark Klein, MD;  Location: [facility];  Service: General;  Laterality: N/A;  . COLONOSCOPY  2018  . FEMORAL-POPLITEAL BYPASS GRAFT Left 12/31/2018   Procedure: PATCH ANGIOPLASTY REPAIR LEFT FEMORAL ARTERY USING HEMASHIELD PLATINUM FINESSE PATCH;  Surgeon: Rosetta Posner, MD;  Location: [facility];  Service: Vascular;  Laterality: Left;  . IR RADIOLOGY PERIPHERAL GUIDED IV START  05/30/2017  . IR RADIOLOGY PERIPHERAL GUIDED IV START  09/02/2018  . IR RADIOLOGY PERIPHERAL GUIDED IV START  09/02/2018  . IR US GUIDE VASC ACCESS LEFT  09/02/2018  . IR US GUIDE VASC ACCESS RIGHT  05/30/2017  .  IR US GUIDE VASC ACCESS RIGHT  09/02/2018  . JOINT REPLACEMENT  2016   sacroplasty  . LAMINECTOMY    . RIGHT/LEFT HEART CATH AND CORONARY ANGIOGRAPHY N/A 11/27/2018   Procedure: RIGHT/LEFT HEART CATH AND CORONARY ANGIOGRAPHY;  Surgeon: Sherren Mocha, MD;  Location: [place];  Service: Cardiovascular;  Laterality: N/A;  . TEE WITHOUT CARDIOVERSION N/A 12/30/2018   Procedure: TRANSESOPHAGEAL ECHOCARDIOGRAM (TEE);  Surgeon: Sherren Mocha, MD;  Location: [place] [facility];  Service: Open Heart Surgery;  Laterality: N/A;   . TOTAL HIP ARTHROPLASTY Bilateral   . TRANSCATHETER AORTIC VALVE REPLACEMENT, TRANSFEMORAL N/A 12/30/2018   Procedure: TRANSCATHETER AORTIC VALVE REPLACEMENT, TRANSFEMORAL;  Surgeon: Sherren Mocha, MD;  Location: [place] [facility];  Service: Open Heart Surgery;  Laterality: N/A;  . TUBAL LIGATION  1975    Social History   Socioeconomic History  . Marital status: Widowed    Spouse name: Not on file  . Number of children: 2  . Years of education: Not on file  . Highest education level: Not on file  Occupational History  . Occupation: n/a  Social Needs  . Financial resource strain: Not on file  . Food insecurity    Worry: Not on file    Inability: Not on file  . Transportation needs    Medical: Not on file    Non-medical: Not on file  Tobacco Use  . Smoking status: Former Smoker    Packs/day: 1.00    Years: 40.00    Pack years: 40.00    Types: Cigarettes    Quit date: 03/19/2004    Years since quitting: 15.1  . Smokeless tobacco: Never Used  Substance and Sexual Activity  . Alcohol use: Yes    Alcohol/week: 3.0 - 4.0 standard drinks    Types: 3 - 4 Glasses of wine per week    Comment: socially  . Drug use: No  . Sexual activity: Not on file  Lifestyle  . Physical activity    Days per week: Not on file    Minutes per session: Not on file  . Stress: Not on file  Relationships  . Social Herbalist on phone: Not on file    Gets together: Not on file    Attends religious service: Not on file    Active member of club or organization: Not on file    Attends meetings of clubs or organizations: Not on file    Relationship status: Not on file  . Intimate partner violence    Fear of current or ex partner: Not on file    Emotionally abused: Not on file    Physically abused: Not on file    Forced sexual activity: Not on file  Other Topics Concern  . Not on file  Social History Narrative   From [place]   1 living son      Allergies as of 05/25/2019   No  Known Allergies     Medication List       Accurate as of May 25, 2019  9:07 PM. If you have any questions, ask your nurse or doctor.        acetaminophen 325 MG tablet Commonly known as: TYLENOL Take 2 tablets (650 mg total) by mouth every 6 (six) hours as needed for mild pain (or Fever >/= 101).   albuterol 108 (90 Base) MCG/ACT inhaler Commonly known as: VENTOLIN HFA Inhale 2 puffs into the lungs every 6 (six) hours as needed  for wheezing.   amLODipine 2.5 MG tablet Commonly known as: NORVASC Take 1 tablet (2.5 mg total) by mouth daily. Started by: Kathlene November, MD   aspirin EC 81 MG tablet Take 81 mg by mouth daily.   CALCIUM 600 + D PO Take 600 mg by mouth 3 (three) times a week.   denosumab 60 MG/ML Soln injection Commonly known as: PROLIA Inject 60 mg into the skin every 6 (six) months. Administer in upper arm, thigh, or abdomen   EPINEPHrine 0.3 mg/0.3 mL Soaj injection Commonly known as: EPI-PEN Inject 0.3 mg into the muscle as needed for anaphylaxis.   loratadine 10 MG tablet Commonly known as: CLARITIN Take 10 mg by mouth daily as needed for allergies.   magnesium oxide 400 (241.3 Mg) MG tablet Commonly known as: MAG-OX Take 1 tablet (400 mg total) by mouth daily.   metoprolol succinate 25 MG 24 hr tablet Commonly known as: Toprol XL Take 1 tablet (25 mg total) by mouth daily.   multivitamin with minerals Tabs tablet Take 1 tablet by mouth daily.   ondansetron 4 MG disintegrating tablet Commonly known as: Zofran ODT Take 1 tablet (4 mg total) by mouth every 8 (eight) hours as needed for nausea or vomiting.   rosuvastatin 40 MG tablet Commonly known as: CRESTOR TAKE 1 TABLET BY MOUTH EVERY DAY   Vitamin D3 125 MCG (5000 UT) Caps Take 5,000 Units by mouth daily.           Objective:   Physical Exam BP (!) 153/74 (BP Location: Left Arm, Patient Position: Sitting, Cuff Size: Small)   Pulse 71   Temp 97.8 F (36.6 C) (Oral)   Resp 18   Ht  5\' 6"  (1.676 m)   Wt 149 lb 4 oz (67.7 kg)   SpO2 93%   BMI 24.09 kg/m  General:   Well developed, NAD, BMI noted. HEENT:  Normocephalic . Face symmetric, atraumatic Carotid arteries: Palpable pulses bilaterally. Temples: No TTP, TA palpable. Lungs:  CTA B Normal respiratory effort, no intercostal retractions, no accessory muscle use. Heart: RRR,  no murmur.  No pretibial edema bilaterally  Skin: Not pale. Not jaundice Neurologic:  alert & oriented X3.  Speech normal, gait appropriate for age and unassisted EOMI, pupils equal and reactive, DTR symmetric. Psych--  Cognition and judgment appear intact.  Cooperative with normal attention span and concentration.  Behavior appropriate. No anxious or depressed appearing.      Assessment      Assessment HTN Hyperlipidemia PULM: --Former smoker, quit 2006 --H/o Emphysema, remote -- CT chest angio  05-2016---> pulm nodule x2,reticular infiltrate ; f/u CT 11/2016 stable nodule, no f/u -- CT chest angio  05-2016--->  enlarged R P.A. and Ao ; hac CT coronary 05/2017, f/u per cards CV: ----H/o murmur (since childhood) ----DOE: [facility]ling  8-017, CT angio chest 05-2016:  no PE  -----s/p TVAR 12/2018 ---Paroxysmal A. fib DX 6/28/ 2020 in the context of acute cholecystitis, converted to NSR, not anticoagulated as of 05/25/2019 -[facility] --DJD --Sacral Fx, sacral insufficiency, s/p sacroplasty (radiology) 07-2015 --H/o osteoporosis :  took fosamax while in [place], Fosamax rx again by ortho after sacral Fx 07-2015, never took it, first Prolia 11-14-2015 --T score 08-2015 >> normal (likely normal d/t DJD?) H/o vit D def: Normal level 09-2015   PLAN  New onset of headache: 78 year old female, presents with new onset of headache in the context of a TVAR 12-2018, new onset of paroxysmal A. fib 04/26/2019, with  no associated fever, chills or muscle aches. She does have blurred vision and diplopia. DDX is large, most concerning TIA. EKG today  NSR , rate 70 Plan: BMP, CBC, sed rate, CT head without stat, carotid ultrasound, urgent neuro referral, continue aspirin   Further advised with results, ER if severe or persistent symptoms. P- Atrial fibrillation:  diagnosed with atrial fibrillation at the  emergency room in the setting of acute cholecystitis and abdominal pain.  Anticoagulation not started, she was d/c on ASA .(Plavix d/c during admission for GB and not restarted b/c was rx d/t a femoral artery injury). Saw cardiology 05/12/2019 on f/u , was Rx a 2-week ZIO cardiac monitor which she is wearing .  EKG today NSR.  No change. HTN: Currently on metoprolol, BP today slightly elevated, at home.  Is in the 150s over 28s. Restart low-dose amlodipine 2.5 mg RTC 4 months    Today, I spent more than 40 min with the patient: >50% of the time counseling regards new onset of headache, explained the patient why I suspect sxs could be a TIA, I did extensive chart review because since I saw her she had surgery on the Diagnosis of paroxysmal atrial fibrillation.

## 2019-05-25 NOTE — Patient Instructions (Signed)
See you next year!  Continue to eat heart healthy diet (full of fruits, vegetables, whole grains, lean protein, water--limit salt, fat, and sugar intake) and increase physical activity as tolerated.  Continue doing brain stimulating activities (puzzles, reading, adult coloring books, staying active) to keep memory sharp.    Dominique [name] , Thank you for taking time to come for your Medicare Wellness Visit. I appreciate your ongoing commitment to your health goals. Please review the following plan we discussed and let me know if I can assist you in the future.   These are the goals we discussed: Goals    . Increase physical activity       This is a list of the screening recommended for you and due dates:  Health Maintenance  Topic Date Due  . DEXA scan (bone density measurement)  09/01/2017  . Mammogram  05/15/2019  . Flu Shot  05/30/2019  . Tetanus Vaccine  04/24/2026  . Pneumonia vaccines  Completed    Health Maintenance After Age 93 After age 69, you are at a higher risk for certain long-term diseases and infections as well as injuries from falls. Falls are a major cause of broken bones and head injuries in people who are older than age 45. Getting regular preventive care can help to keep you healthy and well. Preventive care includes getting regular testing and making lifestyle changes as recommended by your health care provider. Talk with your health care provider about:  Which screenings and tests you should have. A screening is a test that checks for a disease when you have no symptoms.  A diet and exercise plan that is right for you. What should I know about screenings and tests to prevent falls? Screening and testing are the best ways to find a health problem early. Early diagnosis and treatment give you the best chance of managing medical conditions that are common after age 54. Certain conditions and lifestyle choices may make you more likely to have a fall. Your health care  provider may recommend:  Regular vision checks. Poor vision and conditions such as cataracts can make you more likely to have a fall. If you wear glasses, make sure to get your prescription updated if your vision changes.  Medicine review. Work with your health care provider to regularly review all of the medicines you are taking, including over-the-counter medicines. Ask your health care provider about any side effects that may make you more likely to have a fall. Tell your health care provider if any medicines that you take make you feel dizzy or sleepy.  Osteoporosis screening. Osteoporosis is a condition that causes the bones to get weaker. This can make the bones weak and cause them to break more easily.  Blood pressure screening. Blood pressure changes and medicines to control blood pressure can make you feel dizzy.  Strength and balance checks. Your health care provider may recommend certain tests to check your strength and balance while standing, walking, or changing positions.  Foot health exam. Foot pain and numbness, as well as not wearing proper footwear, can make you more likely to have a fall.  Depression screening. You may be more likely to have a fall if you have a fear of falling, feel emotionally low, or feel unable to do activities that you used to do.  Alcohol use screening. Using too much alcohol can affect your balance and may make you more likely to have a fall. What actions can I take to lower my  risk of falls? General instructions  Talk with your health care provider about your risks for falling. Tell your health care provider if: ? You fall. Be sure to tell your health care provider about all falls, even ones that seem minor. ? You feel dizzy, sleepy, or off-balance.  Take over-the-counter and prescription medicines only as told by your health care provider. These include any supplements.  Eat a healthy diet and maintain a healthy weight. A healthy diet includes  low-fat dairy products, low-fat (lean) meats, and fiber from whole grains, beans, and lots of fruits and vegetables. Home safety  Remove any tripping hazards, such as rugs, cords, and clutter.  Install safety equipment such as grab bars in bathrooms and safety rails on stairs.  Keep rooms and walkways well-lit. Activity   Follow a regular exercise program to stay fit. This will help you maintain your balance. Ask your health care provider what types of exercise are appropriate for you.  If you need a cane or walker, use it as recommended by your health care provider.  Wear supportive shoes that have nonskid soles. Lifestyle  Do not drink alcohol if your health care provider tells you not to drink.  If you drink alcohol, limit how much you have: ? 0-1 drink a day for women. ? 0-2 drinks a day for men.  Be aware of how much alcohol is in your drink. In the [place] one drink equals one typical bottle of beer (12 oz), one-half glass of wine (5 oz), or one shot of hard liquor (1 oz).  Do not use any products that contain nicotine or tobacco, such as cigarettes and e-cigarettes. If you need help quitting, ask your health care provider. Summary  Having a healthy lifestyle and getting preventive care can help to protect your health and wellness after age 38.  Screening and testing are the best way to find a health problem early and help you avoid having a fall. Early diagnosis and treatment give you the best chance for managing medical conditions that are more common for people who are older than age 36.  Falls are a major cause of broken bones and head injuries in people who are older than age 47. Take precautions to prevent a fall at home.  Work with your health care provider to learn what changes you can make to improve your health and wellness and to prevent falls. This information is not intended to replace advice given to you by your health care provider. Make sure you discuss any  questions you have with your health care provider. Document Released: 08/28/2017 Document Revised: 02/05/2019 Document Reviewed: 08/28/2017 Elsevier Patient Education  2020 [employer].

## 2019-05-25 NOTE — Telephone Encounter (Signed)
Refill request for amlodipine- no longer on med list. Please advise.

## 2019-05-26 ENCOUNTER — Ambulatory Visit: Payer: Self-pay | Admitting: Internal Medicine

## 2019-05-26 LAB — CBC WITH DIFFERENTIAL/PLATELET
Basophils Absolute: 0.1 10*3/uL (ref 0.0–0.1)
Basophils Relative: 1.4 % (ref 0.0–3.0)
Eosinophils Absolute: 0.6 10*3/uL (ref 0.0–0.7)
Eosinophils Relative: 8.3 % — ABNORMAL HIGH (ref 0.0–5.0)
HCT: 40.2 % (ref 36.0–46.0)
Hemoglobin: 13.2 g/dL (ref 12.0–15.0)
Lymphocytes Relative: 19.4 % (ref 12.0–46.0)
Lymphs Abs: 1.3 10*3/uL (ref 0.7–4.0)
MCHC: 32.8 g/dL (ref 30.0–36.0)
MCV: 92.2 fl (ref 78.0–100.0)
Monocytes Absolute: 0.7 10*3/uL (ref 0.1–1.0)
Monocytes Relative: 10.8 % (ref 3.0–12.0)
Neutro Abs: 4.1 10*3/uL (ref 1.4–7.7)
Neutrophils Relative %: 60.1 % (ref 43.0–77.0)
Platelets: 316 10*3/uL (ref 150.0–400.0)
RBC: 4.35 Mil/uL (ref 3.87–5.11)
RDW: 14.7 % (ref 11.5–15.5)
WBC: 6.9 10*3/uL (ref 4.0–10.5)

## 2019-05-26 LAB — BASIC METABOLIC PANEL
BUN: 14 mg/dL (ref 6–23)
CO2: 28 mEq/L (ref 19–32)
Calcium: 9.2 mg/dL (ref 8.4–10.5)
Chloride: 105 mEq/L (ref 96–112)
Creatinine, Ser: 0.69 mg/dL (ref 0.40–1.20)
GFR: 82.06 mL/min (ref >60.00)
Glucose, Bld: 100 mg/dL — ABNORMAL HIGH (ref 70–99)
Potassium: 4.5 mEq/L (ref 3.5–5.1)
Sodium: 140 mEq/L (ref 135–145)

## 2019-05-26 LAB — SEDIMENTATION RATE: Sed Rate: 35 mm/hr — ABNORMAL HIGH (ref 0–30)

## 2019-05-26 NOTE — Assessment & Plan Note (Signed)
New onset of headache: 78 year old female, presents with new onset of headache in the context of a TVAR 12-2018, new onset of paroxysmal A. fib 04/26/2019, with no associated fever, chills or muscle aches. She does have blurred vision and diplopia. DDX is large, most concerning TIA. EKG today NSR , rate 70 Plan: BMP, CBC, sed rate, CT head without stat, carotid ultrasound, urgent neuro referral, continue aspirin   Further advised with results, ER if severe or persistent symptoms. P- Atrial fibrillation:  diagnosed with atrial fibrillation at the  emergency room in the setting of acute cholecystitis and abdominal pain.  Anticoagulation not started, she was d/c on ASA .(Plavix d/c during admission for GB and not restarted b/c was rx d/t a femoral artery injury). Saw cardiology 05/12/2019 on f/u , was Rx a 2-week ZIO cardiac monitor which she is wearing .  EKG today NSR.  No change. HTN: Currently on metoprolol, BP today slightly elevated, at home.  Is in the 150s over 54s. Restart low-dose amlodipine 2.5 mg RTC 4 months

## 2019-05-27 ENCOUNTER — Telehealth: Payer: Self-pay | Admitting: Internal Medicine

## 2019-05-27 NOTE — Telephone Encounter (Signed)
Labs and CT essentially negative, patient called and made aware.  She is feeling about the same, to see neurology tomorrow.

## 2019-05-28 ENCOUNTER — Telehealth: Payer: Self-pay | Admitting: Neurology

## 2019-05-28 ENCOUNTER — Other Ambulatory Visit: Payer: Self-pay

## 2019-05-28 ENCOUNTER — Encounter: Payer: Self-pay | Admitting: Neurology

## 2019-05-28 ENCOUNTER — Ambulatory Visit (INDEPENDENT_AMBULATORY_CARE_PROVIDER_SITE_OTHER): Payer: Medicare Other | Admitting: Neurology

## 2019-05-28 VITALS — BP 137/76 | HR 87 | Temp 98.2°F | Ht 66.0 in | Wt 161.0 lb

## 2019-05-28 DIAGNOSIS — I2510 Atherosclerotic heart disease of native coronary artery without angina pectoris: Secondary | ICD-10-CM

## 2019-05-28 DIAGNOSIS — G43009 Migraine without aura, not intractable, without status migrainosus: Secondary | ICD-10-CM

## 2019-05-28 DIAGNOSIS — H538 Other visual disturbances: Secondary | ICD-10-CM

## 2019-05-28 DIAGNOSIS — I480 Paroxysmal atrial fibrillation: Secondary | ICD-10-CM | POA: Diagnosis not present

## 2019-05-28 DIAGNOSIS — R799 Abnormal finding of blood chemistry, unspecified: Secondary | ICD-10-CM | POA: Diagnosis not present

## 2019-05-28 DIAGNOSIS — G459 Transient cerebral ischemic attack, unspecified: Secondary | ICD-10-CM | POA: Diagnosis not present

## 2019-05-28 DIAGNOSIS — R7309 Other abnormal glucose: Secondary | ICD-10-CM | POA: Diagnosis not present

## 2019-05-28 NOTE — Patient Instructions (Addendum)
I had a long discussion with the patient regarding her longstanding history of episodic headaches and blurred vision episodes which likely is seem like migraines though she had a prolonged episode with daily headaches for 3 weeks following recent gallbladder surgery however it seems to be improving.  I recommend checking MRI scan of the brain and MRA of the brain and neck given history of atrial fibrillation evaluate for silent strokes.  Continue aspirin for stroke prevention for now and maintain aggressive risk factor modification with strict control of hypertension with blood pressure goal below 130/90, lipids with LDL cholesterol goal below 70 mg percent and diabetes with hemoglobin A1c goal below 6.5%.  Keep appointment for upcoming carotid ultrasound study next week.  If there is evidence of silent strokes on MRI may consider switching aspirin to anticoagulation with Eliquis in the future.  She will return for follow-up in 2 months or call earlier if necessary

## 2019-05-28 NOTE — Progress Notes (Signed)
[facility] [address] [place]. [place] [address] [phone]       OFFICE CONSULT NOTE  [name] Date of Birth:  01/12/1941 Medical Record Number:  [id number]   Referring MD: Belinda Fisher  Reason for Referral: Headache and blurred vision HPI: Dominique [name] is a 78 year old Caucasian lady of [place] origin who is seen today for initial office consultation visit for headaches and blurred vision episodes.  History is obtained from her, review of electronic medical records and personally reviewed imaging films in PACS.  She states that she has a longstanding history of intermittent headaches and blurred vision since she was in her 48s.  She had outgrown them but when she was in her late 61s they started recurring at a frequency of once per month or so.  However since the last 3 weeks following gallbladder surgery on 04/28/2019 she has had daily headaches as well as blurred vision episodes which surprisingly seem to have stopped in the last 2 days.  She describes the headache as starting in the left temples with moderate intensity not accompanied by nausea vomiting light or sound sensitivity.  She takes Tylenol which seems to work and the headache goes away.  The blurred vision involving both eyes and last about 5 to 10 minutes.  After she has a cup of coffee with sugar that disappears as well.  She denies any true vertigo, speech difficulties, loss of vision, gait or balance problems or numbness.  She denies any episodes in the past suggestive of strokes or definite TIAs.  She was recently diagnosed to have atrial fibrillation on 04/26/2019 and is currently wearing a zio patch monitor to further documented.  She has a chads 2 vascular score of 4.  She is currently on Toprol-XL she was previously on aspirin and Plavix but after her gallbladder surgery was switched only to aspirin alone.  She is tolerating well without bruising or bleeding.  She denies symptoms of scalp tenderness,  jaw claudication, arthralgias or myalgias.  Blood pressures well controlled today it is 137/76.  She has history of aortic valve replacement transfemoral early following which she had some complications requiring emergent femoral arterial endarterectomy with Dacron patch angioplasty.  She did undergo CT scan of the head on 05/25/2019 which I personally reviewed shows no acute abnormality.  She is not had an MRI.  She is scheduled to undergo carotid ultrasound on 06/01/2019.  She has not had any recent lipid profile or hemoglobin A1c checked.  ROS:   14 system review of systems is positive for headache, blurred vision decreased hearing, memory loss and all other systems negative PMH:  Past Medical History:  Diagnosis Date   Atrial fibrillation ([place])    a. dx 03/2019 while in hospital with cholecystitis   COPD (chronic obstructive pulmonary disease) ([place])    Critical lower limb ischemia    a. after TAVR developed ischemic L leg likely due to flap/closure, s/p emergent repair of left femoral artery with endarterectomy and Dacron patch angioplasty.   Emphysema lung ([facility])    Hyperlipidemia    LDL goal < 70   Hypertension    Hypomagnesemia    Normal coronary arteries 10/2018   NSVT (nonsustained ventricular tachycardia) ([place]) 03/02/2019   Pulmonary HTN ([place]) 06/05/2016   Moderate with PASP 75mmHg by echo 04/2016 likely Group 3 from COPD and possibly Group 2 from pulmonary venous HTN associated with moderate AS - f/u echo 12/2018 post TAVR showed normal RVSP  Pulmonary nodules    seen on pre TAVR CT, needs 12 month CT follow up   S/P TAVR (transcatheter aortic valve replacement) 12/30/2018   Medtronic Evolut Pro-Plus THV (size 26 mm, serial # [id number]) via the TF approach   Sacral fracture ([facility])    Severe aortic stenosis    a. s/p TAVR 12/2018.   Thoracic ascending aortic aneurysm [facility])    needs yearly follow up    Social History:  Social History   Socioeconomic History   Marital  status: Widowed    Spouse name: Not on file   Number of children: 2   Years of education: Not on file   Highest education level: Not on file  Occupational History   Occupation: n/a  Social Designer, fashion/clothing strain: Not on file   Food insecurity    Worry: Not on file    Inability: Not on file   Transportation needs    Medical: Not on file    Non-medical: Not on file  Tobacco Use   Smoking status: Former Smoker    Packs/day: 1.00    Years: 40.00    Pack years: 40.00    Types: Cigarettes    Quit date: 03/19/2004    Years since quitting: 15.2   Smokeless tobacco: Never Used  Substance and Sexual Activity   Alcohol use: Yes    Alcohol/week: 3.0 - 4.0 standard drinks    Types: 3 - 4 Glasses of wine per week    Comment: socially   Drug use: No   Sexual activity: Not on file  Lifestyle   Physical activity    Days per week: Not on file    Minutes per session: Not on file   Stress: Not on file  Relationships   Social connections    Talks on phone: Not on file    Gets together: Not on file    Attends religious service: Not on file    Active member of club or organization: Not on file    Attends meetings of clubs or organizations: Not on file    Relationship status: Not on file   Intimate partner violence    Fear of current or ex partner: Not on file    Emotionally abused: Not on file    Physically abused: Not on file    Forced sexual activity: Not on file  Other Topics Concern   Not on file  Social History Narrative   From [place]   1 living son    Medications:   Current Outpatient Medications on File Prior to Visit  Medication Sig Dispense Refill   acetaminophen (TYLENOL) 325 MG tablet Take 2 tablets (650 mg total) by mouth every 6 (six) hours as needed for mild pain (or Fever >/= 101).     amLODipine (NORVASC) 2.5 MG tablet Take 1 tablet (2.5 mg total) by mouth daily. 30 tablet 0   aspirin EC 81 MG tablet Take 81 mg by mouth daily.      Calcium Carb-Cholecalciferol (CALCIUM 600 + D PO) Take 600 mg by mouth 3 (three) times a week.      Cholecalciferol (VITAMIN D3) 5000 UNITS CAPS Take 5,000 Units by mouth daily.      denosumab (PROLIA) 60 MG/ML SOLN injection Inject 60 mg into the skin every 6 (six) months. Administer in upper arm, thigh, or abdomen     EPINEPHrine 0.3 mg/0.3 mL IJ SOAJ injection Inject 0.3 mg into the muscle as needed for anaphylaxis.  12   loratadine (CLARITIN) 10 MG tablet Take 10 mg by mouth daily as needed for allergies.      magnesium oxide (MAG-OX) 400 (241.3 Mg) MG tablet Take 1 tablet (400 mg total) by mouth daily. 90 tablet 1   metoprolol succinate (TOPROL XL) 25 MG 24 hr tablet Take 1 tablet (25 mg total) by mouth daily. 90 tablet 1   Multiple Vitamin (MULTIVITAMIN WITH MINERALS) TABS tablet Take 1 tablet by mouth daily.     rosuvastatin (CRESTOR) 40 MG tablet TAKE 1 TABLET BY MOUTH EVERY DAY 90 tablet 3   albuterol (PROVENTIL HFA;VENTOLIN HFA) 108 (90 Base) MCG/ACT inhaler Inhale 2 puffs into the lungs every 6 (six) hours as needed for wheezing.     ondansetron (ZOFRAN ODT) 4 MG disintegrating tablet Take 1 tablet (4 mg total) by mouth every 8 (eight) hours as needed for nausea or vomiting. (Patient not taking: Reported on 05/28/2019) 20 tablet 0   No current facility-administered medications on file prior to visit.     Allergies:  No Known Allergies  Physical Exam General: Frail elderly Caucasian lady, seated, in no evident distress Head: head normocephalic and atraumatic.   Neck: supple with no carotid or supraclavicular bruits Cardiovascular: regular rate and rhythm, soft ejection murmur over the base. Musculoskeletal: no deformity Skin:  no rash/petichiae Vascular:  Normal pulses all extremities  Neurologic Exam Mental Status: Awake and fully alert. Oriented to place and time. Recent and remote memory intact. Attention span, concentration and fund of knowledge appropriate. Mood  and affect appropriate.  Cranial Nerves: Fundoscopic exam reveals sharp disc margins. Pupils equal, briskly reactive to light. Extraocular movements full without nystagmus. Visual fields full to confrontation. Hearing intact. Facial sensation intact. Face, tongue, palate moves normally and symmetrically.  Motor: Normal bulk and tone. Normal strength in all tested extremity muscles. Sensory.: intact to touch , pinprick , position and vibratory sensation.  Coordination: Rapid alternating movements normal in all extremities. Finger-to-nose and heel-to-shin performed accurately bilaterally. Gait and Station: Arises from chair without difficulty. Stance is normal. Gait demonstrates normal stride length and balance . Able to heel, toe and tandem walk with moderate difficulty.  Reflexes: 1+ and symmetric. Toes downgoing.   NIHSS  0 Modified Rankin  1   ASSESSMENT: 78 year old Caucasian lady with longstanding recurrent episodes of headache and transient blurred vision possibly migrainous in nature.  Recent episode of daily headaches for 3 weeks following gallbladder surgery which appears to be improving.  New diagnosis of paroxysmal atrial fibrillation but chads 2 Vascor of 4 and placed on aspirin alone for now.     PLAN: I had a long discussion with the patient regarding her longstanding history of episodic headaches and blurred vision episodes which likely is seem like migraines though she had a prolonged episode with daily headaches for 3 weeks following recent gallbladder surgery however it seems to be improving.  I recommend checking MRI scan of the brain and MRA of the brain and neck given history of atrial fibrillation evaluate for silent strokes.  Continue aspirin for stroke prevention for now and maintain aggressive risk factor modification with strict control of hypertension with blood pressure goal below 130/90, lipids with LDL cholesterol goal below 70 mg percent and diabetes with hemoglobin  A1c goal below 6.5%.  Keep appointment for upcoming carotid ultrasound study next week.  If there is evidence of silent strokes on MRI may consider switching aspirin to anticoagulation with Eliquis in the future.  Greater than 50%  time during this 45-minute consultation visit was spent on counseling and coordination of care about her recurrent episodes of headache and blurred vision and answering questions she will return for follow-up in 2 months or call earlier if necessary Antony Contras, MD  [facility] [address] [place], [place] [address]  Phone [phone] Fax [phone] Note: This document was prepared with digital dictation and possible smart phrase technology. Any transcriptional errors that result from this process are unintentional.

## 2019-05-28 NOTE — Telephone Encounter (Signed)
medicare/bcbs supp order sent to GI. No auth they will reach out to the patient to schedule.  

## 2019-05-29 LAB — LIPID PANEL
Chol/HDL Ratio: 2.6 ratio (ref 0.0–4.4)
Cholesterol, Total: 139 mg/dL (ref 100–199)
HDL: 54 mg/dL (ref >39)
LDL Calculated: 59 mg/dL (ref 0–99)
Triglycerides: 129 mg/dL (ref 0–149)
VLDL Cholesterol Cal: 26 mg/dL (ref 5–40)

## 2019-05-29 LAB — HEMOGLOBIN A1C
Est. average glucose Bld gHb Est-mCnc: 114 mg/dL
Hgb A1c MFr Bld: 5.6 % (ref 4.8–5.6)

## 2019-06-01 ENCOUNTER — Ambulatory Visit (HOSPITAL_BASED_OUTPATIENT_CLINIC_OR_DEPARTMENT_OTHER)
Admission: RE | Admit: 2019-06-01 | Discharge: 2019-06-01 | Disposition: A | Discharge status: Home / Self Care | Payer: Medicare Other | Source: Ambulatory Visit | Attending: Internal Medicine | Admitting: Internal Medicine

## 2019-06-01 ENCOUNTER — Other Ambulatory Visit: Payer: Self-pay

## 2019-06-01 DIAGNOSIS — G459 Transient cerebral ischemic attack, unspecified: Secondary | ICD-10-CM | POA: Diagnosis not present

## 2019-06-01 DIAGNOSIS — G4452 New daily persistent headache (NDPH): Secondary | ICD-10-CM | POA: Insufficient documentation

## 2019-06-01 NOTE — Progress Notes (Signed)
Bilateral carotid doppler performed   06/01/19 Cardell Peach RDCS, RVT

## 2019-06-02 ENCOUNTER — Ambulatory Visit (INDEPENDENT_AMBULATORY_CARE_PROVIDER_SITE_OTHER): Payer: Medicare Other | Admitting: Internal Medicine

## 2019-06-02 ENCOUNTER — Encounter (HOSPITAL_BASED_OUTPATIENT_CLINIC_OR_DEPARTMENT_OTHER): Payer: Self-pay

## 2019-06-02 ENCOUNTER — Emergency Department (HOSPITAL_BASED_OUTPATIENT_CLINIC_OR_DEPARTMENT_OTHER)
Admission: EM | Admit: 2019-06-02 | Discharge: 2019-06-02 | Disposition: A | Discharge status: Home / Self Care | Payer: Medicare Other | Attending: Emergency Medicine | Admitting: Emergency Medicine

## 2019-06-02 ENCOUNTER — Other Ambulatory Visit: Payer: Self-pay

## 2019-06-02 ENCOUNTER — Emergency Department (HOSPITAL_BASED_OUTPATIENT_CLINIC_OR_DEPARTMENT_OTHER): Payer: Medicare Other

## 2019-06-02 ENCOUNTER — Telehealth: Payer: Self-pay | Admitting: Internal Medicine

## 2019-06-02 ENCOUNTER — Encounter: Payer: Self-pay | Admitting: Internal Medicine

## 2019-06-02 VITALS — BP 103/63 | HR 92 | Temp 97.9°F | Resp 20 | Ht 66.0 in | Wt 155.4 lb

## 2019-06-02 DIAGNOSIS — Z7982 Long term (current) use of aspirin: Secondary | ICD-10-CM | POA: Diagnosis not present

## 2019-06-02 DIAGNOSIS — Z20828 Contact with and (suspected) exposure to other viral communicable diseases: Secondary | ICD-10-CM | POA: Insufficient documentation

## 2019-06-02 DIAGNOSIS — R112 Nausea with vomiting, unspecified: Secondary | ICD-10-CM | POA: Diagnosis not present

## 2019-06-02 DIAGNOSIS — J449 Chronic obstructive pulmonary disease, unspecified: Secondary | ICD-10-CM | POA: Diagnosis not present

## 2019-06-02 DIAGNOSIS — R1084 Generalized abdominal pain: Secondary | ICD-10-CM | POA: Diagnosis present

## 2019-06-02 DIAGNOSIS — Z87891 Personal history of nicotine dependence: Secondary | ICD-10-CM | POA: Insufficient documentation

## 2019-06-02 DIAGNOSIS — R197 Diarrhea, unspecified: Secondary | ICD-10-CM | POA: Insufficient documentation

## 2019-06-02 DIAGNOSIS — Z79899 Other long term (current) drug therapy: Secondary | ICD-10-CM | POA: Insufficient documentation

## 2019-06-02 DIAGNOSIS — N179 Acute kidney failure, unspecified: Secondary | ICD-10-CM | POA: Diagnosis not present

## 2019-06-02 DIAGNOSIS — E860 Dehydration: Secondary | ICD-10-CM

## 2019-06-02 DIAGNOSIS — R1114 Bilious vomiting: Secondary | ICD-10-CM | POA: Diagnosis not present

## 2019-06-02 DIAGNOSIS — I10 Essential (primary) hypertension: Secondary | ICD-10-CM | POA: Insufficient documentation

## 2019-06-02 DIAGNOSIS — R0602 Shortness of breath: Secondary | ICD-10-CM | POA: Diagnosis not present

## 2019-06-02 DIAGNOSIS — I2510 Atherosclerotic heart disease of native coronary artery without angina pectoris: Secondary | ICD-10-CM | POA: Insufficient documentation

## 2019-06-02 DIAGNOSIS — R1110 Vomiting, unspecified: Secondary | ICD-10-CM | POA: Diagnosis not present

## 2019-06-02 LAB — HEMOCCULT GUIAC POC 1CARD (OFFICE): Fecal Occult Blood, POC: NEGATIVE

## 2019-06-02 LAB — COMPREHENSIVE METABOLIC PANEL
ALT: 57 U/L — ABNORMAL HIGH (ref 0–44)
AST: 89 U/L — ABNORMAL HIGH (ref 15–41)
Albumin: 3.5 g/dL (ref 3.5–5.0)
Alkaline Phosphatase: 88 U/L (ref 38–126)
Anion gap: 12 (ref 5–15)
BUN: 22 mg/dL (ref 8–23)
CO2: 24 mmol/L (ref 22–32)
Calcium: 8.7 mg/dL — ABNORMAL LOW (ref 8.9–10.3)
Chloride: 99 mmol/L (ref 98–111)
Creatinine, Ser: 1.15 mg/dL — ABNORMAL HIGH (ref 0.44–1.00)
GFR calc Af Amer: 52 mL/min — ABNORMAL LOW (ref >60)
GFR calc non Af Amer: 45 mL/min — ABNORMAL LOW (ref >60)
Glucose, Bld: 116 mg/dL — ABNORMAL HIGH (ref 70–99)
Potassium: 3.5 mmol/L (ref 3.5–5.1)
Sodium: 135 mmol/L (ref 135–145)
Total Bilirubin: 0.7 mg/dL (ref 0.3–1.2)
Total Protein: 6.9 g/dL (ref 6.5–8.1)

## 2019-06-02 LAB — CBC WITH DIFFERENTIAL/PLATELET
Abs Immature Granulocytes: 0.06 10*3/uL (ref 0.00–0.07)
Basophils Absolute: 0.1 10*3/uL (ref 0.0–0.1)
Basophils Relative: 1 %
Eosinophils Absolute: 0 10*3/uL (ref 0.0–0.5)
Eosinophils Relative: 0 %
HCT: 40.3 % (ref 36.0–46.0)
Hemoglobin: 13 g/dL (ref 12.0–15.0)
Immature Granulocytes: 1 %
Lymphocytes Relative: 15 %
Lymphs Abs: 1.4 10*3/uL (ref 0.7–4.0)
MCH: 29.6 pg (ref 26.0–34.0)
MCHC: 32.3 g/dL (ref 30.0–36.0)
MCV: 91.8 fL (ref 80.0–100.0)
Monocytes Absolute: 0.7 10*3/uL (ref 0.1–1.0)
Monocytes Relative: 8 %
Neutro Abs: 6.8 10*3/uL (ref 1.7–7.7)
Neutrophils Relative %: 75 %
Platelets: 185 10*3/uL (ref 150–400)
RBC: 4.39 MIL/uL (ref 3.87–5.11)
RDW: 13.9 % (ref 11.5–15.5)
WBC: 9.1 10*3/uL (ref 4.0–10.5)
nRBC: 0 % (ref 0.0–0.2)

## 2019-06-02 LAB — D-DIMER, QUANTITATIVE: D-Dimer, Quant: 3.03 ug/mL-FEU — ABNORMAL HIGH (ref 0.00–0.50)

## 2019-06-02 LAB — TROPONIN I (HIGH SENSITIVITY): Troponin I (High Sensitivity): 10 ng/L (ref <18)

## 2019-06-02 LAB — LIPASE, BLOOD: Lipase: 38 U/L (ref 11–51)

## 2019-06-02 MED ORDER — ONDANSETRON 4 MG PO TBDP: ORAL_TABLET | ORAL | 0 refills | Status: DC

## 2019-06-02 MED ORDER — DOXYCYCLINE HYCLATE 100 MG PO CAPS: 100.0000 mg | ORAL_CAPSULE | Freq: Two times a day (BID) | ORAL | 0 refills | Status: DC

## 2019-06-02 MED ORDER — IOHEXOL 350 MG/ML SOLN
100.0000 mL | Freq: Once | INTRAVENOUS | Status: AC | PRN
  Administered 2019-06-02: 14:00:00 100 mL via INTRAVENOUS

## 2019-06-02 MED ORDER — ONDANSETRON HCL 4 MG/2ML IJ SOLN
4.0000 mg | Freq: Once | INTRAMUSCULAR | Status: AC
  Administered 2019-06-02: 12:00:00 4 mg via INTRAVENOUS
  Filled 2019-06-02: qty 2

## 2019-06-02 MED ORDER — SODIUM CHLORIDE 0.9 % IV BOLUS
1000.0000 mL | Freq: Once | INTRAVENOUS | Status: AC
  Administered 2019-06-02: 12:00:00 1000 mL via INTRAVENOUS

## 2019-06-02 MED FILL — ONDANSETRON ODT 4 MG TABLET: 4 | 3 days supply | Qty: 20 | Fill #0

## 2019-06-02 MED FILL — DOXYCYCLINE HYCLATE 100 MG: 100 | 7 days supply | Qty: 14 | Fill #0

## 2019-06-02 NOTE — ED Provider Notes (Signed)
[facility] EMERGENCY DEPARTMENT Provider Note   CSN: [id number] Arrival date & time: 06/02/19  1115    History   Chief Complaint Chief Complaint  Patient presents with   Emesis    HPI Dominique [name] is a 78 y.o. female.     78 yo F with a chief complaints of nausea vomiting and diarrhea.  She has been throwing up for about a week and then started having diarrhea today.  Describes the vomiting is yellowish.  Was able to get some pudding and some soup down a few days ago but nothing really since.  Has been getting progressively weak having trouble getting up and moving around.  No few episodes of loose stools today.  Not dark not bloody.  No abdominal pain except when pressure is applied to her abdomen by her family doctor upstairs.  States it is very mild to the epigastrium.  She also has been having some shortness of breath on exertion.  No cough no fever.  As I was leaving the room the patient stated that she is felt somewhat unwell since she had her gallbladder out about a month ago.  She had some headaches and blurry vision initially and then has had some generalized fatigue and shortness of breath on exertion off and on since.  The history is provided by the patient.  Emesis Associated symptoms: diarrhea   Associated symptoms: no abdominal pain, no arthralgias, no chills, no cough, no fever, no headaches and no myalgias   Illness Severity:  Moderate Onset quality:  Gradual Timing:  Constant Progression:  Worsening Chronicity:  New Associated symptoms: diarrhea, fatigue, nausea and vomiting   Associated symptoms: no abdominal pain, no chest pain, no congestion, no cough, no fever, no headaches, no myalgias, no rhinorrhea, no shortness of breath and no wheezing     Past Medical History:  Diagnosis Date   Atrial fibrillation ([place])    a. dx 03/2019 while in hospital with cholecystitis   COPD (chronic obstructive pulmonary disease) ([place])    Critical lower  limb ischemia    a. after TAVR developed ischemic L leg likely due to flap/closure, s/p emergent repair of left femoral artery with endarterectomy and Dacron patch angioplasty.   Emphysema lung (H[facility])    Hyperlipidemia    LDL goal < 70   Hypertension    Hypomagnesemia    Normal coronary arteries 10/2018   NSVT (nonsustained ventricular tachycardia) ([place]) 03/02/2019   Pulmonary HTN ([place]) 06/05/2016   Moderate with PASP 77mmHg by echo 04/2016 likely Group 3 from COPD and possibly Group 2 from pulmonary venous HTN associated with moderate AS - f/u echo 12/2018 post TAVR showed normal RVSP   Pulmonary nodules    seen on pre TAVR CT, needs 12 month CT follow up   S/P TAVR (transcatheter aortic valve replacement) 12/30/2018   Medtronic Evolut Pro-Plus THV (size 26 mm, serial # [id number]) via the TF approach   Sacral fracture (HCC)    Severe aortic stenosis    a. s/p TAVR 12/2018.   Thoracic ascending aortic aneurysm [facility])    needs yearly follow up    Patient Active Problem List   Diagnosis Date Noted   Paroxysmal atrial fibrillation ([place])    Acute cholecystitis 04/26/2019   NSVT (nonsustained ventricular tachycardia) ([place]) 03/02/2019   Ischemic leg 01/01/2019   Thoracic ascending aortic aneurysm ([place]) 12/30/2018   S/P TAVR (transcatheter aortic valve replacement) 12/30/2018   CAD (coronary artery disease), native coronary artery  Multiple lung nodules 06/15/2016   Severe aortic stenosis    PCP NOTES >>>>>>> 10/15/2015   Vitamin D deficiency 10/14/2015   Sacral fracture ([place])    Sciatica 06/08/2015   History of colonic polyps 03/30/2015   Hypocalcemia 12/27/2014   Essential hypertension 03/19/2014   Hyperlipidemia 03/19/2014   COPD GOLD II 03/19/2014    Past Surgical History:  Procedure Laterality Date   APPENDECTOMY  1951   CATARACT EXTRACTION     CESAREAN SECTION     1971   CHOLECYSTECTOMY N/A 04/28/2019   Procedure: LAPAROSCOPIC  CHOLECYSTECTOMY;  Surgeon: Stark Klein, MD;  Location: [facility];  Service: General;  Laterality: N/A;   COLONOSCOPY  2018   FEMORAL-POPLITEAL BYPASS GRAFT Left 12/31/2018   Procedure: PATCH ANGIOPLASTY REPAIR LEFT FEMORAL ARTERY USING [place];  Surgeon: Rosetta Posner, MD;  Location: [facility];  Service: Vascular;  Laterality: Left;   IR RADIOLOGY PERIPHERAL GUIDED IV START  05/30/2017   IR RADIOLOGY PERIPHERAL GUIDED IV START  09/02/2018   IR RADIOLOGY PERIPHERAL GUIDED IV START  09/02/2018   IR US GUIDE VASC ACCESS LEFT  09/02/2018   IR US GUIDE VASC ACCESS RIGHT  05/30/2017   IR US GUIDE VASC ACCESS RIGHT  09/02/2018   JOINT REPLACEMENT  2016   sacroplasty   LAMINECTOMY     RIGHT/LEFT HEART CATH AND CORONARY ANGIOGRAPHY N/A 11/27/2018   Procedure: RIGHT/LEFT HEART CATH AND CORONARY ANGIOGRAPHY;  Surgeon: Sherren Mocha, MD;  Location: [facility] CV LAB;  Service: Cardiovascular;  Laterality: N/A;   TEE WITHOUT CARDIOVERSION N/A 12/30/2018   Procedure: TRANSESOPHAGEAL ECHOCARDIOGRAM (TEE);  Surgeon: Sherren Mocha, MD;  Location: [facility] CV [facility];  Service: Open Heart Surgery;  Laterality: N/A;   TOTAL HIP ARTHROPLASTY Bilateral    TRANSCATHETER AORTIC VALVE REPLACEMENT, TRANSFEMORAL N/A 12/30/2018   Procedure: TRANSCATHETER AORTIC VALVE REPLACEMENT, TRANSFEMORAL;  Surgeon: Sherren Mocha, MD;  Location: [facility] CV LAB;  Service: Open Heart Surgery;  Laterality: N/A;   TUBAL LIGATION  1975     OB History   No obstetric history on file.      Home Medications    Prior to Admission medications   Medication Sig Start Date End Date Taking? Authorizing Provider  acetaminophen (TYLENOL) 325 MG tablet Take 2 tablets (650 mg total) by mouth every 6 (six) hours as needed for mild pain (or Fever >/= 101). 04/29/19   Rayburn, Floyce Stakes, PA-C  albuterol (PROVENTIL HFA;VENTOLIN HFA) 108 (90 Base) MCG/ACT inhaler Inhale 2 puffs into the lungs every 6 (six) hours as  needed for wheezing.    [provider]  amLODipine (NORVASC) 2.5 MG tablet Take 1 tablet (2.5 mg total) by mouth daily. 05/25/19   Colon Branch, MD  aspirin EC 81 MG tablet Take 81 mg by mouth daily.    [provider]  Calcium Carb-Cholecalciferol (CALCIUM 600 + D PO) Take 600 mg by mouth 3 (three) times a week.     [provider]  Cholecalciferol (VITAMIN D3) 5000 UNITS CAPS Take 5,000 Units by mouth daily.     [provider]  denosumab (PROLIA) 60 MG/ML SOLN injection Inject 60 mg into the skin every 6 (six) months. Administer in upper arm, thigh, or abdomen    [provider]  doxycycline (VIBRAMYCIN) 100 MG capsule Take 1 capsule (100 mg total) by mouth 2 (two) times daily. One po bid x 7 days 06/02/19   Deno Etienne, DO  EPINEPHrine 0.3 mg/0.3 mL  IJ SOAJ injection Inject 0.3 mg into the muscle as needed for anaphylaxis.  12/01/17   [provider]  loratadine (CLARITIN) 10 MG tablet Take 10 mg by mouth daily as needed for allergies.     [provider]  magnesium oxide (MAG-OX) 400 (241.3 Mg) MG tablet Take 1 tablet (400 mg total) by mouth daily. 03/02/19   [name], MD  metoprolol succinate (TOPROL XL) 25 MG 24 hr tablet Take 1 tablet (25 mg total) by mouth daily. 04/30/19   Rai, Vernelle Emerald, MD  Multiple Vitamin (MULTIVITAMIN WITH MINERALS) TABS tablet Take 1 tablet by mouth daily.    [provider]  ondansetron (ZOFRAN ODT) 4 MG disintegrating tablet 4mg  ODT q4 hours prn nausea/vomit 06/02/19   Deno Etienne, DO  rosuvastatin (CRESTOR) 40 MG tablet TAKE 1 TABLET BY MOUTH EVERY DAY 08/14/18   [name], MD    Family History Family History  Problem Relation Age of Onset   Breast cancer Mother        breast   Diabetes Son        type 1   Cancer Brother        lung   Colon cancer Neg Hx    CAD Neg Hx    Colon polyps Neg Hx    Rectal cancer Neg Hx    Stomach cancer Neg Hx     Social History Social  History   Tobacco Use   Smoking status: Former Smoker    Packs/day: 1.00    Years: 40.00    Pack years: 40.00    Types: Cigarettes    Quit date: 03/19/2004    Years since quitting: 15.2   Smokeless tobacco: Never Used  Substance Use Topics   Alcohol use: Yes    Alcohol/week: 3.0 - 4.0 standard drinks    Types: 3 - 4 Glasses of wine per week    Comment: socially   Drug use: No     Allergies   Patient has no known allergies.   Review of Systems Review of Systems  Constitutional: Positive for fatigue. Negative for chills and fever.  HENT: Negative for congestion and rhinorrhea.   Eyes: Negative for redness and visual disturbance.  Respiratory: Negative for cough, shortness of breath and wheezing.   Cardiovascular: Negative for chest pain and palpitations.  Gastrointestinal: Positive for diarrhea, nausea and vomiting. Negative for abdominal pain.  Genitourinary: Negative for dysuria and urgency.  Musculoskeletal: Negative for arthralgias and myalgias.  Skin: Negative for pallor and wound.  Neurological: Negative for dizziness and headaches.     Physical Exam Updated Vital Signs BP 121/64    Pulse 73    Temp 98.9 F (37.2 C) (Oral)    Resp 19    SpO2 98%   Physical Exam Vitals signs and nursing note reviewed.  Constitutional:      General: She is not in acute distress.    Appearance: She is well-developed. She is not diaphoretic.  HENT:     Head: Normocephalic and atraumatic.  Eyes:     Pupils: Pupils are equal, round, and reactive to light.  Neck:     Musculoskeletal: Normal range of motion and neck supple.  Cardiovascular:     Rate and Rhythm: Normal rate and regular rhythm.     Heart sounds: No murmur. No friction rub. No gallop.   Pulmonary:     Effort: Pulmonary effort is normal.     Breath sounds: No wheezing or rales.  Comments: Tachypnea Abdominal:     General: There is no distension.     Palpations: Abdomen is soft.     Tenderness: There is  abdominal tenderness (mild epigastric). There is no guarding.  Musculoskeletal:        General: No tenderness.  Skin:    General: Skin is warm and dry.  Neurological:     Mental Status: She is alert and oriented to person, place, and time.  Psychiatric:        Behavior: Behavior normal.      ED Treatments / Results  Labs (all labs ordered are listed, but only abnormal results are displayed) Labs Reviewed  COMPREHENSIVE METABOLIC PANEL - Abnormal; Notable for the following components:      Result Value   Glucose, Bld 116 (*)    Creatinine, Ser 1.15 (*)    Calcium 8.7 (*)    AST 89 (*)    ALT 57 (*)    GFR calc non Af Amer 45 (*)    GFR calc Af Amer 52 (*)    All other components within normal limits  D-DIMER, QUANTITATIVE (NOT AT [facility]) - Abnormal; Notable for the following components:   D-Dimer, Quant 3.03 (*)    All other components within normal limits  NOVEL CORONAVIRUS, NAA (HOSPITAL ORDER, SEND-OUT TO REF LAB)  CBC WITH DIFFERENTIAL/PLATELET  LIPASE, BLOOD  TROPONIN I (HIGH SENSITIVITY)  TROPONIN I (HIGH SENSITIVITY)    EKG EKG Interpretation  Date/Time:  Tuesday June 02 2019 11:55:45 EDT Ventricular Rate:  81 PR Interval:    QRS Duration: 88 QT Interval:  384 QTC Calculation: 446 R Axis:   61 Text Interpretation:  Sinus rhythm No significant change since last tracing Confirmed by Deno Etienne [phone]) on 06/02/2019 1:14:37 PM   Radiology Dg Chest 2 View  Result Date: 06/02/2019 CLINICAL DATA:  Shortness of breath EXAM: CHEST - 2 VIEW COMPARISON:  05/26/2019 FINDINGS: Overlying cardiac device. Aortic valve replacement again noted. Heart size is normal. Lungs are clear without focal consolidation, pleural effusion, or pneumothorax. IMPRESSION: No active cardiopulmonary disease. Electronically Signed   By: Davina Poke M.D.   On: 06/02/2019 11:58   Ct Angio Chest Pe W And/or Wo Contrast  Result Date: 06/02/2019 CLINICAL DATA:  One-week vomiting, fatigue and  shortness of breath. Generalized abdominal pain. Positive D-dimer, intermediate probability. EXAM: CT ANGIOGRAPHY CHEST CT ABDOMEN AND PELVIS WITH CONTRAST TECHNIQUE: Multidetector CT imaging of the chest was performed using the standard protocol during bolus administration of intravenous contrast. Multiplanar CT image reconstructions and MIPs were obtained to evaluate the vascular anatomy. Multidetector CT imaging of the abdomen and pelvis was performed using the standard protocol during bolus administration of intravenous contrast. CONTRAST:  131mL OMNIPAQUE IOHEXOL 350 MG/ML SOLN COMPARISON:  CT abdomen pelvis April 26, 2019, same day chest radiograph, chest angiogram 12/09/2018 FINDINGS: CTA CHEST FINDINGS Cardiovascular: Satisfactory opacification of the pulmonary arteries to the segmental level. No evidence of pulmonary embolism. The central pulmonary arteries are enlarged. Cardiac size is borderline enlarged. No pericardial effusion. Transcatheter aortic valve replacement is noted. Ascending aorta is upper limits normal measuring 3.9 cm in transverse dimension. Mediastinum/Nodes: Multiple hypoattenuating nodules within the thyroid, largest in the left lobe measuring up to 1.3 cm. No enlarged mediastinal or axillary lymph nodes. The trachea is unremarkable. Small hiatal hernia. Lungs/Pleura: Few patchy areas of ground-glass attenuation in the posterior left upper lobe and right upper lobe. Dependent areas of atelectasis. No pneumothorax. No effusion. Background of emphysematous change  and small airways disease. Musculoskeletal: No chest wall mass or suspicious bone lesions identified. Review of the MIP images confirms the above findings. CT ABDOMEN and PELVIS FINDINGS Hepatobiliary: No suspicious liver lesions. Mild intra and extrahepatic biliary ductal dilatation post cholecystectomy changes. Low-attenuation (8 HU) collection in the gallbladder fossa faint hazy stranding adjacent. Slight prominence of biliary  tree. Pancreas: Mild pancreatic atrophy. Otherwise unremarkable. No pancreatic ductal dilatation or surrounding inflammatory changes. Spleen: Normal in size without focal abnormality. Adrenals/Urinary Tract: Several fluid attenuation cysts are present in both kidneys, largest in the upper pole right kidney measures up to 2.1 cm. No suspicious renal lesions. No hydronephrosis. No visible urolithiasis. Distal urinary tract is difficult to evaluate given extensive streak artifact in the pelvis. Stomach/Bowel: Small hiatal hernia. The stomach and duodenal sweep are unremarkable. No bowel wall thickening or dilatation. No evidence of obstruction. The appendix is surgically absent. Scattered colonic diverticula without focal pericolonic inflammation to suggest diverticulitis. Distal sigmoid and rectum are poorly evaluated due to extensive streak artifact in the pelvis. Vascular/Lymphatic: Atherosclerotic plaque within the normal caliber aorta. No suspicious or enlarged lymph nodes in the included lymphatic chains. Reproductive: Retroflexed uterus. Adnexa are poorly evaluated due to streak artifact. Other: No abdominal wall hernia or abnormality. No abdominopelvic ascites. Postsurgical changes in the left groin likely from prior access site. Musculoskeletal: Bilateral total hip arthroplasties resultant extensive streak artifact limiting evaluation of the structures of pelvis as well as the adjacent osseous and soft tissues bones are diffusely demineralized. Multilevel Schmorl's node formation is noted in the spine. Lower lumbar laminectomies and bilateral sacroplasty is noted. Wedging deformity of L 3 is similar to prior exams. Review of the MIP images confirms the above findings. IMPRESSION: 1. No evidence of pulmonary embolism. Enlarged central pulmonary arteries, suggestive of pulmonary arterial hypertension. 2. Few patchy areas of ground-glass attenuation in the posterior left upper lobe and right upper lobe,  nonspecific, but favored to be infectious or inflammatory in etiology. 3. Mild intra and extrahepatic biliary ductal dilatation in the setting post cholecystectomy changes. Low-attenuation collection in the gallbladder fossa with faint hazy stranding adjacent to the gallbladder fossa, favored to reflect postoperative changes. Superimposed infection cannot be entirely excluded. Correlate with LFTs. 4. Bilateral total hip arthroplasties resultant extensive streak artifact limiting evaluation of the structures of the pelvis as well as the adjacent osseous and soft tissues. 5. Small hiatal hernia. 6. Stable L3 compression deformity. 7. Aortic Atherosclerosis (ICD10-I70.0). Electronically Signed   By: Lovena Le M.D.   On: 06/02/2019 14:35   Ct Abdomen Pelvis W Contrast  Result Date: 06/02/2019 CLINICAL DATA:  One-week vomiting, fatigue and shortness of breath. Generalized abdominal pain. Positive D-dimer, intermediate probability. EXAM: CT ANGIOGRAPHY CHEST CT ABDOMEN AND PELVIS WITH CONTRAST TECHNIQUE: Multidetector CT imaging of the chest was performed using the standard protocol during bolus administration of intravenous contrast. Multiplanar CT image reconstructions and MIPs were obtained to evaluate the vascular anatomy. Multidetector CT imaging of the abdomen and pelvis was performed using the standard protocol during bolus administration of intravenous contrast. CONTRAST:  128mL OMNIPAQUE IOHEXOL 350 MG/ML SOLN COMPARISON:  CT abdomen pelvis April 26, 2019, same day chest radiograph, chest angiogram 12/09/2018 FINDINGS: CTA CHEST FINDINGS Cardiovascular: Satisfactory opacification of the pulmonary arteries to the segmental level. No evidence of pulmonary embolism. The central pulmonary arteries are enlarged. Cardiac size is borderline enlarged. No pericardial effusion. Transcatheter aortic valve replacement is noted. Ascending aorta is upper limits normal measuring 3.9 cm in transverse dimension.  Mediastinum/Nodes: Multiple hypoattenuating nodules within the thyroid, largest in the left lobe measuring up to 1.3 cm. No enlarged mediastinal or axillary lymph nodes. The trachea is unremarkable. Small hiatal hernia. Lungs/Pleura: Few patchy areas of ground-glass attenuation in the posterior left upper lobe and right upper lobe. Dependent areas of atelectasis. No pneumothorax. No effusion. Background of emphysematous change and small airways disease. Musculoskeletal: No chest wall mass or suspicious bone lesions identified. Review of the MIP images confirms the above findings. CT ABDOMEN and PELVIS FINDINGS Hepatobiliary: No suspicious liver lesions. Mild intra and extrahepatic biliary ductal dilatation post cholecystectomy changes. Low-attenuation (8 HU) collection in the gallbladder fossa faint hazy stranding adjacent. Slight prominence of biliary tree. Pancreas: Mild pancreatic atrophy. Otherwise unremarkable. No pancreatic ductal dilatation or surrounding inflammatory changes. Spleen: Normal in size without focal abnormality. Adrenals/Urinary Tract: Several fluid attenuation cysts are present in both kidneys, largest in the upper pole right kidney measures up to 2.1 cm. No suspicious renal lesions. No hydronephrosis. No visible urolithiasis. Distal urinary tract is difficult to evaluate given extensive streak artifact in the pelvis. Stomach/Bowel: Small hiatal hernia. The stomach and duodenal sweep are unremarkable. No bowel wall thickening or dilatation. No evidence of obstruction. The appendix is surgically absent. Scattered colonic diverticula without focal pericolonic inflammation to suggest diverticulitis. Distal sigmoid and rectum are poorly evaluated due to extensive streak artifact in the pelvis. Vascular/Lymphatic: Atherosclerotic plaque within the normal caliber aorta. No suspicious or enlarged lymph nodes in the included lymphatic chains. Reproductive: Retroflexed uterus. Adnexa are poorly  evaluated due to streak artifact. Other: No abdominal wall hernia or abnormality. No abdominopelvic ascites. Postsurgical changes in the left groin likely from prior access site. Musculoskeletal: Bilateral total hip arthroplasties resultant extensive streak artifact limiting evaluation of the structures of pelvis as well as the adjacent osseous and soft tissues bones are diffusely demineralized. Multilevel Schmorl's node formation is noted in the spine. Lower lumbar laminectomies and bilateral sacroplasty is noted. Wedging deformity of L 3 is similar to prior exams. Review of the MIP images confirms the above findings. IMPRESSION: 1. No evidence of pulmonary embolism. Enlarged central pulmonary arteries, suggestive of pulmonary arterial hypertension. 2. Few patchy areas of ground-glass attenuation in the posterior left upper lobe and right upper lobe, nonspecific, but favored to be infectious or inflammatory in etiology. 3. Mild intra and extrahepatic biliary ductal dilatation in the setting post cholecystectomy changes. Low-attenuation collection in the gallbladder fossa with faint hazy stranding adjacent to the gallbladder fossa, favored to reflect postoperative changes. Superimposed infection cannot be entirely excluded. Correlate with LFTs. 4. Bilateral total hip arthroplasties resultant extensive streak artifact limiting evaluation of the structures of the pelvis as well as the adjacent osseous and soft tissues. 5. Small hiatal hernia. 6. Stable L3 compression deformity. 7. Aortic Atherosclerosis (ICD10-I70.0). Electronically Signed   By: Lovena Le M.D.   On: 06/02/2019 14:35    Procedures Procedures (including critical care time)  Medications Ordered in ED Medications  sodium chloride 0.9 % bolus 1,000 mL (0 mLs Intravenous Stopped 06/02/19 1333)  ondansetron (ZOFRAN) injection 4 mg (4 mg Intravenous Given 06/02/19 1227)  iohexol (OMNIPAQUE) 350 MG/ML injection 100 mL (100 mLs Intravenous Contrast  Given 06/02/19 1339)     Initial Impression / Assessment and Plan / ED Course  I have reviewed the triage vital signs and the nursing notes.  Pertinent labs & imaging results that were available during my care of the patient were reviewed by me and considered in my medical  decision making (see chart for details).        78 yo F with a chief complaints of nausea vomiting and diarrhea.  Patient had her gallbladder out about a month ago.  Went to tell her family doctor today after about a week of nausea and vomiting and decreased oral intake and fatigue.  Felt to be orthostatic and sent here for IV fluids and lab work.  I discussed the case with the physician upstairs prior to transfer.  On my exam the patient with very mild epigastric pain.  She is short of breath after just getting off of the stretcher.  We will obtain a laboratory evaluation we will bolus of IV fluids and reassess.  The patient had a d-dimer that was elevated.  CT scan was performed that was negative for pulmonary embolism.  There is some signs of inflammation in the left upper lobe.  Patient denies any significant cough.  She later endorses to me that she has had a couple ticks that were found on her.  No engorgement that she knows of.  She has a macular papular rash that is new over the past couple days to the lower extremities.  She is feeling much better on reassessment.  CT scan of the abdomen pelvis showed likely normal postoperative changes in the right upper quadrant.  Her LFTs are not significantly changed from prior.  She has a mild acute kidney injury.  She is able to tolerate an oral trial without difficulty here in the emergency department and would like to be discharged home.  PCP follow-up.  Dominique [name] was evaluated in Emergency Department on 06/02/2019 for the symptoms described in the history of present illness. He/she was evaluated in the context of the global COVID-19 pandemic, which necessitated  consideration that the patient might be at risk for infection with the SARS-CoV-2 virus that causes COVID-19. Institutional protocols and algorithms that pertain to the evaluation of patients at risk for COVID-19 are in a state of rapid change based on information released by regulatory bodies including the CDC and federal and state organizations. These policies and algorithms were followed during the patient's care in the ED.   3:27 PM:  I have discussed the diagnosis/risks/treatment options with the patient and believe the pt to be eligible for discharge home to follow-up with PCP. We also discussed returning to the ED immediately if new or worsening sx occur. We discussed the sx which are most concerning (e.g., sudden worsening pain, fever, inability to tolerate by mouth) that necessitate immediate return. Medications administered to the patient during their visit and any new prescriptions provided to the patient are listed below.  Medications given during this visit Medications  sodium chloride 0.9 % bolus 1,000 mL (0 mLs Intravenous Stopped 06/02/19 1333)  ondansetron (ZOFRAN) injection 4 mg (4 mg Intravenous Given 06/02/19 1227)  iohexol (OMNIPAQUE) 350 MG/ML injection 100 mL (100 mLs Intravenous Contrast Given 06/02/19 1339)     The patient appears reasonably screen and/or stabilized for discharge and I doubt any other medical condition or other [facility] requiring further screening, evaluation, or treatment in the ED at this time prior to discharge.    Final Clinical Impressions(s) / ED Diagnoses   Final diagnoses:  Nausea vomiting and diarrhea  AKI (acute kidney injury) [facility])    ED Discharge Orders         Ordered    ondansetron (ZOFRAN ODT) 4 MG disintegrating tablet     06/02/19 1524  doxycycline (VIBRAMYCIN) 100 MG capsule  2 times daily     06/02/19 [place], Marieta Markov, DO 06/02/19 1527

## 2019-06-02 NOTE — Assessment & Plan Note (Signed)
Persistent nausea, dehydration. The patient presents with 6-day history of nausea, on exam she is quite orthostatic, she has some epigastric abdominal pain without mass or rebound.  Stools are brown and Hemoccult negative.  She had a cholecystectomy few weeks ago. She also had paroxysmal A. fib, seems to be in NSR today.  Not anticoagulated. Plan: We will refer her to the ER for labs, likely IVF will be needed.  Further disposition per ER MD.  Case discussed with him. Skin lesion: Has a skin lesion on the back, erythematous, doubt it has to do with current symptoms but might need antibiotics

## 2019-06-02 NOTE — Telephone Encounter (Signed)
Patient reported yesterday to the vascular center technician that she was not feeling well. I called her today, feeling unwell for 6 days.  No fever, diarrhea, cough.  Will be seen today at 10

## 2019-06-02 NOTE — ED Notes (Signed)
Patient transported to CT 

## 2019-06-02 NOTE — Progress Notes (Signed)
Pre visit review using our clinic review tool, if applicable. No additional management support is needed unless otherwise documented below in the visit note. 

## 2019-06-02 NOTE — ED Notes (Signed)
2 unsuccessful IV attempts.

## 2019-06-02 NOTE — Patient Instructions (Signed)
Please go to the ER downstairs 

## 2019-06-02 NOTE — ED Notes (Signed)
Patient transported to X-ray 

## 2019-06-02 NOTE — ED Notes (Signed)
Pt given gingerale for PO challenge 

## 2019-06-02 NOTE — ED Triage Notes (Addendum)
Pt c/o n/v, SOB x 6 days-diarrhea started yesterday-pt to tx area in w/c-NAD

## 2019-06-02 NOTE — Progress Notes (Signed)
Subjective:    Patient ID: Dominique [name], female    DOB: [birth year], 78 y.o.   MRN: [id number]  DOS:  06/02/2019 Type of visit - description: Acute visit Not feeling well for the last 6 days: Decreased appetite, forcing herself to eat. She has nausea and vomited twice a day since then, yesterday for the first time she vomited only once.  Typically vomits bile, no hematemesis. Denies abdominal pain. Stools are infrequent and today she had loos stools without blood or any different color. Denies taking Motrin or other NSAIDs.  BP Readings from Last 3 Encounters:  06/02/19 103/63  05/28/19 137/76  05/25/19 (!) 153/74     Review of Systems No fever, some chills No heartburn No cough, slightly short of breath? Mild dizziness when she stands up   Past Medical History:  Diagnosis Date   Atrial fibrillation ([place])    a. dx 03/2019 while in hospital with cholecystitis   COPD (chronic obstructive pulmonary disease) ([place])    Critical lower limb ischemia    a. after TAVR developed ischemic L leg likely due to flap/closure, s/p emergent repair of left femoral artery with endarterectomy and Dacron patch angioplasty.   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 s/p TAVR 12/2018.   Thoracic ascending aortic aneurysm ([place])    needs yearly  follow up    Past Surgical History:  Procedure Laterality Date   [place] N/A 04/28/2019   Procedure: LAPAROSCOPIC CHOLECYSTECTOMY;  Surgeon: Stark Klein, MD;  Location: [facility];  Service: General;  Laterality: N/A;   COLONOSCOPY  2018   FEMORAL-POPLITEAL BYPASS GRAFT Left 12/31/2018   Procedure: PATCH ANGIOPLASTY REPAIR LEFT FEMORAL ARTERY USING [place];  Surgeon: Rosetta Posner, MD;  Location: [facility];  Service: Vascular;  Laterality: Left;   IR RADIOLOGY PERIPHERAL GUIDED IV START  05/30/2017   IR RADIOLOGY PERIPHERAL GUIDED IV START  09/02/2018   IR RADIOLOGY PERIPHERAL GUIDED IV START  09/02/2018   IR US GUIDE VASC ACCESS LEFT  09/02/2018   IR US GUIDE VASC ACCESS RIGHT  05/30/2017   IR US GUIDE VASC ACCESS RIGHT  09/02/2018   JOINT REPLACEMENT  2016   sacroplasty   LAMINECTOMY     RIGHT/LEFT HEART CATH AND CORONARY ANGIOGRAPHY N/A 11/27/2018   Procedure: RIGHT/LEFT HEART CATH AND CORONARY ANGIOGRAPHY;  Surgeon: Sherren Mocha, MD;  Location: [place] [facility];  Service: Cardiovascular;  Laterality: N/A;   TEE WITHOUT CARDIOVERSION N/A 12/30/2018   Procedure: TRANSESOPHAGEAL ECHOCARDIOGRAM (TEE);  Surgeon: Sherren Mocha, MD;  Location: [place] [facility];  Service: Open Heart Surgery;  Laterality: N/A;  TOTAL HIP ARTHROPLASTY Bilateral    TRANSCATHETER AORTIC VALVE REPLACEMENT, TRANSFEMORAL N/A 12/30/2018   Procedure: TRANSCATHETER AORTIC VALVE REPLACEMENT, TRANSFEMORAL;  Surgeon: Sherren Mocha, MD;  Location: [place] [facility];  Service: Open Heart Surgery;  Laterality: N/A;   TUBAL LIGATION  1975    Social History   Socioeconomic History   Marital status: Widowed    Spouse name: Not on file   Number of children: 2   Years of education: Not on file   Highest education level: Not on file  Occupational History   Occupation: n/a  Social Transport planner strain: Not on file   Food insecurity    Worry: Not on file    Inability: Not on file   Transportation needs    Medical: Not on file    Non-medical: Not on file  Tobacco Use   Smoking status: Former Smoker    Packs/day: 1.00    Years: 40.00    Pack years: 40.00    Types: Cigarettes    Quit date: 03/19/2004    Years since quitting: 15.2   Smokeless tobacco: Never Used  Substance and Sexual Activity   Alcohol use: Yes    Alcohol/week: 3.0 - 4.0 standard drinks    Types: 3 - 4 Glasses of wine per week    Comment: socially   Drug use: No   Sexual activity: Not on file  Lifestyle   Physical activity    Days per week: Not on file    Minutes per session: Not on file   Stress: Not on file  Relationships   Social connections    Talks on phone: Not on file    Gets together: Not on file    Attends religious service: Not on file    Active member of club or organization: Not on file    Attends meetings of clubs or organizations: Not on file    Relationship status: Not on file   Intimate partner violence    Fear of current or ex partner: Not on file    Emotionally abused: Not on file    Physically abused: Not on file    Forced sexual activity: Not on file  Other Topics Concern   Not on file  Social History Narrative   From [place]   1 living son      Allergies as of 06/02/2019   No Known Allergies     Medication List       Accurate as of June 02, 2019 10:43 AM. If you have any questions, ask your nurse or doctor.        acetaminophen 325 MG tablet Commonly known as: TYLENOL Take 2 tablets (650 mg total) by mouth every 6 (six) hours as needed for mild pain (or Fever >/= 101).   albuterol 108 (90 Base) MCG/ACT inhaler Commonly known as: VENTOLIN HFA Inhale 2 puffs into the lungs every 6 (six) hours as needed for wheezing.   amLODipine 2.5 MG tablet Commonly known as: NORVASC Take 1 tablet (2.5 mg total) by mouth daily.   aspirin EC  81 MG tablet Take 81 mg by mouth daily.   CALCIUM 600 + D PO Take 600 mg by mouth 3 (three) times a week.   denosumab 60 MG/ML Soln injection Commonly known as: PROLIA Inject 60 mg into the skin every 6 (six) months. Administer in upper arm, thigh, or abdomen   EPINEPHrine 0.3 mg/0.3 mL Soaj injection Commonly known as: EPI-PEN Inject 0.3  mg into the muscle as needed for anaphylaxis.   loratadine 10 MG tablet Commonly known as: CLARITIN Take 10 mg by mouth daily as needed for allergies.   magnesium oxide 400 (241.3 Mg) MG tablet Commonly known as: MAG-OX Take 1 tablet (400 mg total) by mouth daily.   metoprolol succinate 25 MG 24 hr tablet Commonly known as: Toprol XL Take 1 tablet (25 mg total) by mouth daily.   multivitamin with minerals Tabs tablet Take 1 tablet by mouth daily.   ondansetron 4 MG disintegrating tablet Commonly known as: Zofran ODT Take 1 tablet (4 mg total) by mouth every 8 (eight) hours as needed for nausea or vomiting.   rosuvastatin 40 MG tablet Commonly known as: CRESTOR TAKE 1 TABLET BY MOUTH EVERY DAY   Vitamin D3 125 MCG (5000 UT) Caps Take 5,000 Units by mouth daily.           Objective:   Physical Exam BP 103/63 (BP Location: Left Arm, Patient Position: Sitting, Cuff Size: Small)    Pulse 92    Temp 97.9 F (36.6 C) (Oral)    Resp 20    Ht 5\' 6"  (1.676 m)    Wt 155 lb 6 oz (70.5 kg)    SpO2 95%    BMI 25.08 kg/m  General:   Well developed, looks very tired but not toxic.  BMI noted.  HEENT:  Normocephalic . Face symmetric, atraumatic Lungs:  CTA B Normal respiratory effort, no intercostal retractions, no accessory muscle use. Heart: RRR,  no murmur.  no pretibial edema bilaterally  Abdomen:  Not distended, soft, + tenderness at the epigastric area without mass or rebound Skin: Erythematous lesion at the mid back noted. DRE: Brown stools, Hemoccult negative Neurologic:  alert & oriented X3.  Speech normal, gait appropriate  for age and unassisted Psych--  Cognition and judgment appear intact.  Cooperative with normal attention span and concentration.  Behavior appropriate. No anxious or depressed appearing.     Assessment     Assessment HTN Hyperlipidemia PULM: --Former smoker, quit 2006 --H/o Emphysema, remote -- CT chest angio  05-2016---> pulm nodule x2,reticular infiltrate ; f/u CT 11/2016 stable nodule, no f/u -- CT chest angio  05-2016--->  enlarged R P.A. and Ao ; hac CT coronary 05/2017, f/u per cards CV: ----H/o murmur (since childhood) ----DOE: Brantley Fling  8-017, CT angio chest 05-2016:  no PE  -----s/p TVAR 12/2018 ---Paroxysmal A. fib DX 6/28/ 2020 in the context of acute cholecystitis, converted to NSR, not anticoagulated as of 05/25/2019 -[facility] --DJD --Sacral Fx, sacral insufficiency, s/p sacroplasty (radiology) 07-2015 --H/o osteoporosis :  took fosamax while in [place], Fosamax rx again by ortho after sacral Fx 07-2015, never took it, first Prolia 11-14-2015 --T score 08-2015 >> normal (likely normal d/t DJD?) H/o vit D def: Normal level 09-2015   PLAN  Persistent nausea, dehydration. The patient presents with 6-day history of nausea, on exam she is quite orthostatic, she has some epigastric abdominal pain without mass or rebound.  Stools are brown and Hemoccult negative.  She had a cholecystectomy few weeks ago. She also had paroxysmal A. fib, seems to be in NSR today.  Not anticoagulated. Plan: We will refer her to the ER for labs, likely IVF will be needed.  Further disposition per ER MD.  Case discussed with him. Skin lesion: Has a skin lesion on the back, erythematous, doubt it has to do with current symptoms but might need antibiotics   Today,  I spent more than 25   min with the patient: >50% of the time counseling regards her current symptoms, doing a detailed physical exam, contacting ER MD, explaining the patient why she needs to go to the ER.

## 2019-06-02 NOTE — Discharge Instructions (Signed)
Please return for inability to eat or drink worsening abdominal pain worsening shortness of breath.

## 2019-06-03 LAB — NOVEL CORONAVIRUS, NAA (HOSP ORDER, SEND-OUT TO REF LAB; TAT 18-24 HRS): SARS-CoV-2, NAA: NOT DETECTED

## 2019-06-04 ENCOUNTER — Telehealth: Payer: Self-pay | Admitting: Internal Medicine

## 2019-06-04 NOTE — Telephone Encounter (Addendum)
Calling to check on the patient. She continue feeling unwell: Nausea, vomiting, no  hematemesis , has developed diarrhea for 1 day. She also had transient generalized rash, red spots, they are getting better. ER notes reviewed: Creatinine and LFTs increased.  CBC okay.  Coronavirus test negative. CT chest and abdomen showed No PE but glass ground opacities in the chest.  Also mild intra-and extrahepatic biliary ductal dilatation.  Possibly postoperative changes but a super imposed infx cannot be excluded. At this point, DDX includes   COVID-19 , biliary infection, viral diarrhea, other viral illnesses etc. We agreed on Continue doxycycline as prescribed by the ER Drink plenty fluids, Zofran as needed. Follow-up here in 10 days (provided she has no fever and she feels somewhat better), will call and make an appointment.   ER if: Ongoing symptoms for more than 2 to 3 days, weakness, dizziness, high fever, rash again, severe abdominal pain, unable to drink fluids. She verbalized understanding.

## 2019-06-08 ENCOUNTER — Other Ambulatory Visit: Payer: Self-pay

## 2019-06-08 ENCOUNTER — Emergency Department (HOSPITAL_BASED_OUTPATIENT_CLINIC_OR_DEPARTMENT_OTHER): Payer: Medicare Other

## 2019-06-08 ENCOUNTER — Telehealth: Payer: Self-pay | Admitting: Internal Medicine

## 2019-06-08 ENCOUNTER — Emergency Department (HOSPITAL_BASED_OUTPATIENT_CLINIC_OR_DEPARTMENT_OTHER)
Admission: EM | Admit: 2019-06-08 | Discharge: 2019-06-08 | Disposition: A | Discharge status: Home / Self Care | Payer: Medicare Other | Attending: Emergency Medicine | Admitting: Emergency Medicine

## 2019-06-08 ENCOUNTER — Encounter (HOSPITAL_BASED_OUTPATIENT_CLINIC_OR_DEPARTMENT_OTHER): Payer: Self-pay | Admitting: *Deleted

## 2019-06-08 DIAGNOSIS — Z7982 Long term (current) use of aspirin: Secondary | ICD-10-CM | POA: Insufficient documentation

## 2019-06-08 DIAGNOSIS — I10 Essential (primary) hypertension: Secondary | ICD-10-CM | POA: Insufficient documentation

## 2019-06-08 DIAGNOSIS — E876 Hypokalemia: Secondary | ICD-10-CM | POA: Insufficient documentation

## 2019-06-08 DIAGNOSIS — Z96642 Presence of left artificial hip joint: Secondary | ICD-10-CM | POA: Diagnosis not present

## 2019-06-08 DIAGNOSIS — J449 Chronic obstructive pulmonary disease, unspecified: Secondary | ICD-10-CM | POA: Insufficient documentation

## 2019-06-08 DIAGNOSIS — Z96641 Presence of right artificial hip joint: Secondary | ICD-10-CM | POA: Insufficient documentation

## 2019-06-08 DIAGNOSIS — R112 Nausea with vomiting, unspecified: Secondary | ICD-10-CM

## 2019-06-08 DIAGNOSIS — K219 Gastro-esophageal reflux disease without esophagitis: Secondary | ICD-10-CM | POA: Diagnosis not present

## 2019-06-08 DIAGNOSIS — Z952 Presence of prosthetic heart valve: Secondary | ICD-10-CM | POA: Diagnosis not present

## 2019-06-08 DIAGNOSIS — Z79899 Other long term (current) drug therapy: Secondary | ICD-10-CM | POA: Insufficient documentation

## 2019-06-08 DIAGNOSIS — K5900 Constipation, unspecified: Secondary | ICD-10-CM | POA: Diagnosis not present

## 2019-06-08 DIAGNOSIS — Z87891 Personal history of nicotine dependence: Secondary | ICD-10-CM | POA: Insufficient documentation

## 2019-06-08 LAB — URINALYSIS, ROUTINE W REFLEX MICROSCOPIC
Bilirubin Urine: NEGATIVE
Glucose, UA: NEGATIVE mg/dL
Hgb urine dipstick: NEGATIVE
Ketones, ur: NEGATIVE mg/dL
Leukocytes,Ua: NEGATIVE
Nitrite: NEGATIVE
Protein, ur: NEGATIVE mg/dL
Specific Gravity, Urine: 1.01 (ref 1.005–1.030)
pH: 6.5 (ref 5.0–8.0)

## 2019-06-08 LAB — COMPREHENSIVE METABOLIC PANEL
ALT: 35 U/L (ref 0–44)
AST: 46 U/L — ABNORMAL HIGH (ref 15–41)
Albumin: 3.3 g/dL — ABNORMAL LOW (ref 3.5–5.0)
Alkaline Phosphatase: 71 U/L (ref 38–126)
Anion gap: 11 (ref 5–15)
BUN: 17 mg/dL (ref 8–23)
CO2: 28 mmol/L (ref 22–32)
Calcium: 9.3 mg/dL (ref 8.9–10.3)
Chloride: 102 mmol/L (ref 98–111)
Creatinine, Ser: 0.78 mg/dL (ref 0.44–1.00)
GFR calc Af Amer: >60 mL/min (ref >60)
GFR calc non Af Amer: >60 mL/min (ref >60)
Glucose, Bld: 109 mg/dL — ABNORMAL HIGH (ref 70–99)
Potassium: 3.1 mmol/L — ABNORMAL LOW (ref 3.5–5.1)
Sodium: 141 mmol/L (ref 135–145)
Total Bilirubin: 0.5 mg/dL (ref 0.3–1.2)
Total Protein: 6.8 g/dL (ref 6.5–8.1)

## 2019-06-08 LAB — LIPASE, BLOOD: Lipase: 41 U/L (ref 11–51)

## 2019-06-08 LAB — CBC WITH DIFFERENTIAL/PLATELET
Abs Immature Granulocytes: 0.08 10*3/uL — ABNORMAL HIGH (ref 0.00–0.07)
Basophils Absolute: 0.1 10*3/uL (ref 0.0–0.1)
Basophils Relative: 1 %
Eosinophils Absolute: 0.4 10*3/uL (ref 0.0–0.5)
Eosinophils Relative: 4 %
HCT: 40.9 % (ref 36.0–46.0)
Hemoglobin: 13 g/dL (ref 12.0–15.0)
Immature Granulocytes: 1 %
Lymphocytes Relative: 22 %
Lymphs Abs: 2.2 10*3/uL (ref 0.7–4.0)
MCH: 29 pg (ref 26.0–34.0)
MCHC: 31.8 g/dL (ref 30.0–36.0)
MCV: 91.1 fL (ref 80.0–100.0)
Monocytes Absolute: 0.9 10*3/uL (ref 0.1–1.0)
Monocytes Relative: 9 %
Neutro Abs: 6.5 10*3/uL (ref 1.7–7.7)
Neutrophils Relative %: 63 %
Platelets: 418 10*3/uL — ABNORMAL HIGH (ref 150–400)
RBC: 4.49 MIL/uL (ref 3.87–5.11)
RDW: 14 % (ref 11.5–15.5)
WBC: 10.1 10*3/uL (ref 4.0–10.5)
nRBC: 0 % (ref 0.0–0.2)

## 2019-06-08 MED ORDER — ONDANSETRON HCL 4 MG/2ML IJ SOLN: 4.0000 mg | Freq: Once | INTRAMUSCULAR | Status: DC

## 2019-06-08 MED ORDER — SODIUM CHLORIDE 0.9 % IV SOLN: INTRAVENOUS | Status: DC

## 2019-06-08 MED ORDER — PANTOPRAZOLE SODIUM 40 MG IV SOLR
40.0000 mg | Freq: Once | INTRAVENOUS | Status: AC
  Administered 2019-06-08: 40 mg via INTRAVENOUS
  Filled 2019-06-08: qty 40

## 2019-06-08 MED ORDER — POTASSIUM CHLORIDE CRYS ER 20 MEQ PO TBCR
40.0000 meq | EXTENDED_RELEASE_TABLET | Freq: Once | ORAL | Status: AC
  Administered 2019-06-08: 40 meq via ORAL
  Filled 2019-06-08: qty 2

## 2019-06-08 MED ORDER — PANTOPRAZOLE SODIUM 20 MG PO TBEC: 20.0000 mg | DELAYED_RELEASE_TABLET | Freq: Every day | ORAL | 0 refills | Status: DC

## 2019-06-08 MED ORDER — ONDANSETRON HCL 4 MG/2ML IJ SOLN
4.0000 mg | Freq: Once | INTRAMUSCULAR | Status: AC
  Administered 2019-06-08: 4 mg via INTRAVENOUS
  Filled 2019-06-08: qty 2

## 2019-06-08 MED ORDER — SODIUM CHLORIDE 0.9 % IV BOLUS
500.0000 mL | Freq: Once | INTRAVENOUS | Status: AC
  Administered 2019-06-08: 500 mL via INTRAVENOUS

## 2019-06-08 NOTE — Discharge Instructions (Addendum)
Labs today show signs of improvement.  Start taking the Protonix.  Continue taking Zofran as needed.  Recommend follow-up with gastroenterology for upper endoscopy.  Make an appointment to follow-up with your regular doctor.  Continue to try to hydrate yourself small amounts of liquids with some sugar frequently.  Urinalysis negative for any signs of urinary tract infection.

## 2019-06-08 NOTE — Telephone Encounter (Signed)
I returned the patient's call: She continue with vomiting, daily for the last 2 weeks. No fever chills Appetite is really poor, p.o. tolerance is decreased feels weak, unable to drive her car. No hematemesis. Other symptoms such as a rash or headache are gone.  Patient has intractable vomiting, etiology unclear, recommend ER.  Patient agreed.

## 2019-06-08 NOTE — Telephone Encounter (Signed)
Patient states that she is still nauseous and is requesting call back from [facility] or PCP to discuss.

## 2019-06-08 NOTE — ED Triage Notes (Signed)
Vomiting x 2 weeks. She feels dehydrated. States symptoms started after having gallbladder surgery. She was seen here a week ago and feels no better.

## 2019-06-08 NOTE — Telephone Encounter (Signed)
Please advise 

## 2019-06-08 NOTE — ED Provider Notes (Signed)
[facility] EMERGENCY DEPARTMENT Provider Note   CSN: [id number] Arrival date & time: 06/08/19  1417    History   Chief Complaint Chief Complaint  Patient presents with  . Emesis    HPI Dominique [name] is a 78 y.o. female.     Patient referred for reevaluation due to persistent nausea and vomiting.  The diarrhea that was present on August 4 during her evaluation has resolved.  Patient is been having difficulties since gallbladder removal on June 30.  Patient is not to today followed up with GI medicine.  Patient says diarrhea is resolved vomiting persists nausea persist also seems to be having a lot of reflux.  Patient's been working hard to try to hydrate herself.  Patient denies any vomiting of blood.  Patient states Zofran is not helping.  Patient denies any fevers or upper respiratory symptoms.  Patient did have a CT of her abdomen as well as CT angios of the chest on August 4.  With no acute findings.  Patient is also concerned about having urinary tract infection.     Past Medical History:  Diagnosis Date  . Atrial fibrillation ([place])    a. dx 03/2019 while in hospital with cholecystitis  . COPD (chronic obstructive pulmonary disease) ([place])   . Critical lower limb ischemia    a. after TAVR developed ischemic L leg likely due to flap/closure, s/p emergent repair of left femoral artery with endarterectomy and Dacron patch angioplasty.  . Emphysema lung ([place])   . Hyperlipidemia    LDL goal < 70  . Hypertension   . Hypomagnesemia   . Normal coronary arteries 10/2018  . NSVT (nonsustained ventricular tachycardia) ([place]) 03/02/2019  . Pulmonary HTN ([place]) 06/05/2016   Moderate with PASP 71mmHg by echo 04/2016 likely Group 3 from COPD and possibly Group 2 from pulmonary venous HTN associated with moderate AS - f/u echo 12/2018 post TAVR showed normal RVSP  . Pulmonary nodules    seen on pre TAVR CT, needs 12 month CT follow up  . S/P TAVR (transcatheter aortic valve  replacement) 12/30/2018   Medtronic Evolut Pro-Plus THV (size 26 mm, serial # [id number]) via the TF approach  . Sacral fracture ([place])   . Severe aortic stenosis    a. s/p TAVR 12/2018.  Marland Kitchen Thoracic ascending aortic aneurysm [facility])    needs yearly follow up    Patient Active Problem List   Diagnosis Date Noted  . Paroxysmal atrial fibrillation (H[facility])   . Acute cholecystitis 04/26/2019  . NSVT (nonsustained ventricular tachycardia) ([place]) 03/02/2019  . Ischemic leg 01/01/2019  . Thoracic ascending aortic aneurysm ([place]) 12/30/2018  . S/P TAVR (transcatheter aortic valve replacement) 12/30/2018  . CAD (coronary artery disease), native coronary artery   . Multiple lung nodules 06/15/2016  . Severe aortic stenosis   . PCP NOTES >>>>>>> 10/15/2015  . Vitamin D deficiency 10/14/2015  . Sacral fracture ([place])   . Sciatica 06/08/2015  . History of colonic polyps 03/30/2015  . Hypocalcemia 12/27/2014  . Essential hypertension 03/19/2014  . Hyperlipidemia 03/19/2014  . COPD GOLD II 03/19/2014    Past Surgical History:  Procedure Laterality Date  . APPENDECTOMY  1951  . CATARACT EXTRACTION    . CESAREAN SECTION     1971  . CHOLECYSTECTOMY N/A 04/28/2019   Procedure: LAPAROSCOPIC CHOLECYSTECTOMY;  Surgeon: Stark Klein, MD;  Location: [facility];  Service: General;  Laterality: N/A;  . COLONOSCOPY  2018  . FEMORAL-POPLITEAL BYPASS GRAFT Left 12/31/2018  Procedure: PATCH ANGIOPLASTY REPAIR LEFT FEMORAL ARTERY USING HEMASHIELD PLATINUM FINESSE PATCH;  Surgeon: Rosetta Posner, MD;  Location: [facility];  Service: Vascular;  Laterality: Left;  . IR RADIOLOGY PERIPHERAL GUIDED IV START  05/30/2017  . IR RADIOLOGY PERIPHERAL GUIDED IV START  09/02/2018  . IR RADIOLOGY PERIPHERAL GUIDED IV START  09/02/2018  . IR US GUIDE VASC ACCESS LEFT  09/02/2018  . IR US GUIDE VASC ACCESS RIGHT  05/30/2017  . IR US GUIDE VASC ACCESS RIGHT  09/02/2018  . JOINT REPLACEMENT  2016   sacroplasty  . LAMINECTOMY    . RIGHT/LEFT  HEART CATH AND CORONARY ANGIOGRAPHY N/A 11/27/2018   Procedure: RIGHT/LEFT HEART CATH AND CORONARY ANGIOGRAPHY;  Surgeon: Sherren Mocha, MD;  Location: [facility] CV LAB;  Service: Cardiovascular;  Laterality: N/A;  . TEE WITHOUT CARDIOVERSION N/A 12/30/2018   Procedure: TRANSESOPHAGEAL ECHOCARDIOGRAM (TEE);  Surgeon: Sherren Mocha, MD;  Location: [facility];  Service: Open Heart Surgery;  Laterality: N/A;  . TOTAL HIP ARTHROPLASTY Bilateral   . TRANSCATHETER AORTIC VALVE REPLACEMENT, TRANSFEMORAL N/A 12/30/2018   Procedure: TRANSCATHETER AORTIC VALVE REPLACEMENT, TRANSFEMORAL;  Surgeon: Sherren Mocha, MD;  Location: [facility] LAB;  Service: Open Heart Surgery;  Laterality: N/A;  . TUBAL LIGATION  1975     OB History   No obstetric history on file.      Home Medications    Prior to Admission medications   Medication Sig Start Date End Date Taking? Authorizing Provider  acetaminophen (TYLENOL) 325 MG tablet Take 2 tablets (650 mg total) by mouth every 6 (six) hours as needed for mild pain (or Fever >/= 101). 04/29/19   Rayburn, Floyce Stakes, PA-C  albuterol (PROVENTIL HFA;VENTOLIN HFA) 108 (90 Base) MCG/ACT inhaler Inhale 2 puffs into the lungs every 6 (six) hours as needed for wheezing.    [provider]  amLODipine (NORVASC) 2.5 MG tablet Take 1 tablet (2.5 mg total) by mouth daily. 05/25/19   Colon Branch, MD  aspirin EC 81 MG tablet Take 81 mg by mouth daily.    [provider]  Calcium Carb-Cholecalciferol (CALCIUM 600 + D PO) Take 600 mg by mouth 3 (three) times a week.     [provider]  Cholecalciferol (VITAMIN D3) 5000 UNITS CAPS Take 5,000 Units by mouth daily.     [provider]  denosumab (PROLIA) 60 MG/ML SOLN injection Inject 60 mg into the skin every 6 (six) months. Administer in upper arm, thigh, or abdomen    [provider]  doxycycline (VIBRAMYCIN) 100 MG capsule Take 1 capsule (100 mg total) by mouth 2 (two) times  daily. One po bid x 7 days 06/02/19   Deno Etienne, DO  EPINEPHrine 0.3 mg/0.3 mL IJ SOAJ injection Inject 0.3 mg into the muscle as needed for anaphylaxis.  12/01/17   [provider]  loratadine (CLARITIN) 10 MG tablet Take 10 mg by mouth daily as needed for allergies.     [provider]  magnesium oxide (MAG-OX) 400 (241.3 Mg) MG tablet Take 1 tablet (400 mg total) by mouth daily. 03/02/19   [name], MD  metoprolol succinate (TOPROL XL) 25 MG 24 hr tablet Take 1 tablet (25 mg total) by mouth daily. 04/30/19   Rai, Vernelle Emerald, MD  Multiple Vitamin (MULTIVITAMIN WITH MINERALS) TABS tablet Take 1 tablet by mouth daily.    [provider]  ondansetron (ZOFRAN ODT) 4 MG disintegrating tablet 4mg  ODT q4 hours prn nausea/vomit  06/02/19   Deno Etienne, DO  pantoprazole (PROTONIX) 20 MG tablet Take 1 tablet (20 mg total) by mouth daily. 06/08/19   Fredia Sorrow, MD  rosuvastatin (CRESTOR) 40 MG tablet TAKE 1 TABLET BY MOUTH EVERY DAY 08/14/18   [name], MD    Family History Family History  Problem Relation Age of Onset  . Breast cancer Mother        breast  . Diabetes Son        type 1  . Cancer Brother        lung  . Colon cancer Neg Hx   . CAD Neg Hx   . Colon polyps Neg Hx   . Rectal cancer Neg Hx   . Stomach cancer Neg Hx     Social History Social History   Tobacco Use  . Smoking status: Former Smoker    Packs/day: 1.00    Years: 40.00    Pack years: 40.00    Types: Cigarettes    Quit date: 03/19/2004    Years since quitting: 15.2  . Smokeless tobacco: Never Used  Substance Use Topics  . Alcohol use: Yes    Alcohol/week: 3.0 - 4.0 standard drinks    Types: 3 - 4 Glasses of wine per week    Comment: socially  . Drug use: No     Allergies   Patient has no known allergies.   Review of Systems Review of Systems  Constitutional: Negative for chills and fever.  HENT: Negative for congestion, rhinorrhea and sore throat.   Eyes:  Negative for visual disturbance.  Respiratory: Negative for cough and shortness of breath.   Cardiovascular: Negative for chest pain and leg swelling.  Gastrointestinal: Positive for nausea and vomiting. Negative for abdominal pain and diarrhea.  Genitourinary: Negative for dysuria.  Musculoskeletal: Negative for back pain and neck pain.  Skin: Negative for rash.  Neurological: Negative for dizziness, light-headedness and headaches.  Hematological: Does not bruise/bleed easily.  Psychiatric/Behavioral: Negative for confusion.     Physical Exam Updated Vital Signs BP (!) 155/80   Pulse 74   Temp 98.1 F (36.7 C) (Oral)   Resp 18   Ht 1.676 m (5\' 6" )   Wt 70.5 kg   SpO2 96%   BMI 25.09 kg/m   Physical Exam Vitals signs and nursing note reviewed.  Constitutional:      General: She is not in acute distress.    Appearance: She is well-developed.  HENT:     Head: Normocephalic and atraumatic.  Eyes:     Extraocular Movements: Extraocular movements intact.     Conjunctiva/sclera: Conjunctivae normal.     Pupils: Pupils are equal, round, and reactive to light.  Neck:     Musculoskeletal: Normal range of motion and neck supple.  Cardiovascular:     Rate and Rhythm: Normal rate and regular rhythm.     Heart sounds: No murmur.  Pulmonary:     Effort: Pulmonary effort is normal. No respiratory distress.     Breath sounds: Normal breath sounds.  Abdominal:     Palpations: Abdomen is soft.     Tenderness: There is no abdominal tenderness.  Skin:    General: Skin is warm and dry.  Neurological:     General: No focal deficit present.     Mental Status: She is alert and oriented to person, place, and time.      ED Treatments / Results  Labs (all labs ordered are listed, but only abnormal results  are displayed) Labs Reviewed  COMPREHENSIVE METABOLIC PANEL - Abnormal; Notable for the following components:      Result Value   Potassium 3.1 (*)    Glucose, Bld 109 (*)     Albumin 3.3 (*)    AST 46 (*)    All other components within normal limits  CBC WITH DIFFERENTIAL/PLATELET - Abnormal; Notable for the following components:   Platelets 418 (*)    Abs Immature Granulocytes 0.08 (*)    All other components within normal limits  LIPASE, BLOOD  URINALYSIS, ROUTINE W REFLEX MICROSCOPIC    EKG None  Radiology Dg Abd Acute 2+v W 1v Chest  Result Date: 06/08/2019 CLINICAL DATA:  Cholecystectomy 1 month ago; vomiting and nausea since; diarrhea turned into constipation the last 2 days; no pain EXAM: DG ABDOMEN ACUTE W/ 1V CHEST COMPARISON:  CT, 06/02/2019 FINDINGS: There is no bowel dilation to suggest obstruction. There is no evidence of adynamic ileus. No significant increase in colonic stool. No free air. No evidence of renal or ureteral stones. Status post cholecystectomy. Heart is normal size. Stable prosthetic aortic valve. No mediastinal or hilar masses. Lungs hyperexpanded but clear. No acute skeletal abnormality. Status post cement stabilization of sacral fractures and status post bilateral total hip arthroplasties. IMPRESSION: 1. No acute findings. No evidence of bowel obstruction, generalized adynamic ileus or free air. 2. No acute cardiopulmonary disease. Electronically Signed   By: Lajean Manes M.D.   On: 06/08/2019 16:05    Procedures Procedures (including critical care time)  Medications Ordered in ED Medications  0.9 %  sodium chloride infusion (has no administration in time range)  ondansetron (ZOFRAN) injection 4 mg (4 mg Intravenous Not Given 06/08/19 1557)  sodium chloride 0.9 % bolus 500 mL (0 mLs Intravenous Stopped 06/08/19 1633)  ondansetron (ZOFRAN) injection 4 mg (4 mg Intravenous Given 06/08/19 1549)  potassium chloride SA (K-DUR) CR tablet 40 mEq (40 mEq Oral Given 06/08/19 1653)  pantoprazole (PROTONIX) injection 40 mg (40 mg Intravenous Given 06/08/19 1816)     Initial Impression / Assessment and Plan / ED Course  I have reviewed  the triage vital signs and the nursing notes.  Pertinent labs & imaging results that were available during my care of the patient were reviewed by me and considered in my medical decision making (see chart for details).       Repeat x-ray of chest and abdomen without any acute findings.  Labs actually show improvement in her renal function from before.  Suggesting that patient is doing better with hydration.  Urinalysis is negative.  Will treat patient with Protonix have her follow-up with gastroenterology for consideration for upper endoscopy and further evaluation.  Patient overall feeling better here with IV fluids antinausea medicine and Protonix.  Patient's been having the symptoms since her gallbladder removal on June 30.  But patient had CT scan abdomen pelvis on August 4 without any acute findings.  Patient's potassium was slightly low at 3.1.  Patient was given 40 mEq of potassium here orally which she tolerated fine.   Final Clinical Impressions(s) / ED Diagnoses   Final diagnoses:  Hypokalemia  Non-intractable vomiting with nausea, unspecified vomiting type  Gastroesophageal reflux disease, esophagitis presence not specified    ED Discharge Orders         Ordered    pantoprazole (PROTONIX) 20 MG tablet  Daily     06/08/19 1903           Fredia Sorrow, MD 06/08/19  1927  

## 2019-06-09 ENCOUNTER — Telehealth: Payer: Self-pay | Admitting: Internal Medicine

## 2019-06-09 ENCOUNTER — Telehealth: Payer: Self-pay | Admitting: Gastroenterology

## 2019-06-09 ENCOUNTER — Other Ambulatory Visit: Payer: Self-pay

## 2019-06-09 DIAGNOSIS — R112 Nausea with vomiting, unspecified: Secondary | ICD-10-CM

## 2019-06-09 DIAGNOSIS — R1114 Bilious vomiting: Secondary | ICD-10-CM

## 2019-06-09 DIAGNOSIS — Z9049 Acquired absence of other specified parts of digestive tract: Secondary | ICD-10-CM

## 2019-06-09 MED ORDER — PROMETHAZINE HCL 25 MG PO TABS: 25.0000 mg | ORAL_TABLET | Freq: Two times a day (BID) | ORAL | 0 refills | Status: DC | PRN

## 2019-06-09 NOTE — Telephone Encounter (Signed)
Noted! Thank you

## 2019-06-09 NOTE — Telephone Encounter (Signed)
Urgent GI referral placed- can she be seen this week? Pt is negative for COVID.

## 2019-06-09 NOTE — Progress Notes (Signed)
prometh

## 2019-06-09 NOTE — Telephone Encounter (Signed)
Hi Dominique [name], We received an urgent referral from Dr. Larose Kells (PCP) to see patient for bilious vomiting with nausea, Pt was at the ED yesterday, nothing available until the end of Aug with APP and Sep with Dr. Silverio Decamp. Pt wants to be seen asap. Please advise on scheduling. Thank you.

## 2019-06-09 NOTE — Telephone Encounter (Signed)
Spoke with the patient. She reports she is able to tolerate fluids and some solids today. No further diarrhea. Just completed antibiotics. She wonders if the antibiotic contributed to her symptoms.  Understands to maintain hydration. She will come for labs tomorrow. Appointment is 06/25/19. Will move the appointment if there are any cancellations.

## 2019-06-09 NOTE — Telephone Encounter (Signed)
Please review and advise. There are not openings on anyone's schedule except [facility] possibly tomorrow.

## 2019-06-09 NOTE — Telephone Encounter (Signed)
Went to the ER with persistent vomiting, potassium was low, LFTs improving, lipase, CBC essentially normal.  X-rays abdomen with no acute findings.  Was provided IV fluids and released home. Advise patient, I reviewed ER notes, arrange a GI referral please.  DX persistent nausea/vomiting

## 2019-06-09 NOTE — Telephone Encounter (Signed)
[facility] has contacted pt and sent msg to their triage nurse for sooner appt.

## 2019-06-09 NOTE — Telephone Encounter (Signed)
Please advise patient to drinks sips of fluids, electrolyte drinks/pedialyte. Recheck BMP tomorrow for potassium level and kidney function. Phenergan 25 mg BID as needed.  If persistent diarrhea will need to check GI pathogen panel.  If unable to tolerate adequate PO intake to maintain hydration will need to go back to ER for IV fluids and possible inpatient admission. Reviewed CT from last week, negative for bowel obstruction or acute pathology.  Schedule visit with APP within next available or sooner if have any cancellation.

## 2019-06-10 ENCOUNTER — Other Ambulatory Visit (INDEPENDENT_AMBULATORY_CARE_PROVIDER_SITE_OTHER): Payer: Medicare Other

## 2019-06-10 DIAGNOSIS — R112 Nausea with vomiting, unspecified: Secondary | ICD-10-CM | POA: Diagnosis not present

## 2019-06-10 LAB — BASIC METABOLIC PANEL
BUN: 14 mg/dL (ref 6–23)
CO2: 28 mEq/L (ref 19–32)
Calcium: 9.1 mg/dL (ref 8.4–10.5)
Chloride: 105 mEq/L (ref 96–112)
Creatinine, Ser: 0.7 mg/dL (ref 0.40–1.20)
GFR: 80.7 mL/min (ref >60.00)
Glucose, Bld: 95 mg/dL (ref 70–99)
Potassium: 3.6 mEq/L (ref 3.5–5.1)
Sodium: 141 mEq/L (ref 135–145)

## 2019-06-12 DIAGNOSIS — I480 Paroxysmal atrial fibrillation: Secondary | ICD-10-CM | POA: Diagnosis not present

## 2019-06-14 NOTE — Progress Notes (Signed)
Cardiology Office Note   Date:  06/15/2019   ID:  Sihaam, Chrobak 1941-03-08, MRN [id number]  PCP:  Colon Branch, MD  Cardiologist: Dr. [name] No chief complaint on file.    History of Present Illness: Dominique [name] is a 78 y.o. female who presents for ongoing assessment and management of aortic valve stenosis status post TAVR on 01/16/2019, preprocedure cath showing angiographically normal coronary arteries.  Postoperatively the patient had severe acute anemia felt to be related to probable atrial flap and closure site and underwent emergent repair of the left femoral artery with endarterectomy and Dacron patch angioplasty.  She was recently admitted to the hospital for cholecystectomy.   She was also noted postoperatively to be tachycardic with heart rates of 120 beats per minutes when she was asleep, and found to have atrial fibrillation and tachypalpitations.  She was placed on Toprol-XL 25 mg daily, she was not placed on anticoagulation.  On follow-up appointment 05/12/2019 she was feeling much better, she had multiple questions about whether or not she needed to restart antihypertensive medications or to be started on anticoagulation.  It was not felt that the patient needed to be placed on anticoagulation as her atrial fibrillation was related to physiologic stress in the setting of illness.  She had no further episodes of atrial fibrillation.  We placed her on a 2-week ZIO cardiac monitor for further evaluation of atrial arrhythmia and/or tachycardia and was continued on metoprolol.  Cardiac monitor read on 01/29/2019 revealed basic rhythm was normal sinus rhythm with no bradycardic events, no recurrent atrial fibrillation or flutter, there were episodes of wide-complex that appeared to be artifactual.   Mrs. Duque saw her primary care physician Dr. Kathlene November, complaining of headaches.  Questionable TIA.  Bilateral carotid Doppler ultrasounds were completed on 06/01/2019.  The right  ICA velocities were consistent with 60% to 79% stenosis.  Tardus waveform in the mid ICA distal to the stenosis indicated greater severity, and it was recommended that the patient have a CTA for better definition of RCI stenosis.  Left carotid velocities were consistent with 1 to 39% stenosis.  The patient was referred to neurology by Dr. Larose Kells.  They have scheduled her for an MRI of the brain on 06/30/2019 per notes by Dr.Sethi.   She presents to the clinic today and states she was recently at the hospital due to dizziness, nausea, and vomiting.  She states that this is not a new problem and she has been dealing with it sporadically for several years.  She states that due to this illness she has not been very active and has been sleeping more.  She states the only activity that she gets is when she lets her dog out throughout the day.  She states that her TAVR site has healed up well and that she has no pain in that area.  She denies weakness, elevated blood pressure. She also states she has an appointment scheduled in September with her optometrist as well as her appointment with Dr. Leonie Man.   She denies chest pain, increased shortness of breath, lower extremity edema, fatigue, palpitations, melena, hematuria, hemoptysis, diaphoresis, weakness, presyncope, syncope, orthopnea, and PND.   Past Medical History:  Diagnosis Date  . Atrial fibrillation ([place])    a. dx 03/2019 while in hospital with cholecystitis  . COPD (chronic obstructive pulmonary disease) ([place])   . Critical lower limb ischemia    a. after TAVR developed ischemic L leg likely due to flap/closure,  s/p emergent repair of left femoral artery with endarterectomy and Dacron patch angioplasty.  . Emphysema lung ([place])   . Hyperlipidemia    LDL goal < 70  . Hypertension   . Hypomagnesemia   . Normal coronary arteries 10/2018  . NSVT (nonsustained ventricular tachycardia) ([place]) 03/02/2019  . Pulmonary HTN ([place]) 06/05/2016   Moderate with PASP  59mmHg by echo 04/2016 likely Group 3 from COPD and possibly Group 2 from pulmonary venous HTN associated with moderate AS - f/u echo 12/2018 post TAVR showed normal RVSP  . Pulmonary nodules    seen on pre TAVR CT, needs 12 month CT follow up  . 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 Sacral fracture ([place])   . Severe aortic stenosis    a. s/p TAVR 12/2018.  Marland Kitchen Thoracic ascending aortic aneurysm [facility])    needs yearly follow up    Past Surgical History:  Procedure Laterality Date  . APPENDECTOMY  1951  . CATARACT EXTRACTION    . CESAREAN SECTION     1971  . CHOLECYSTECTOMY N/A 04/28/2019   Procedure: LAPAROSCOPIC CHOLECYSTECTOMY;  Surgeon: Stark Klein, MD;  Location: [facility];  Service: General;  Laterality: N/A;  . COLONOSCOPY  2018  . FEMORAL-POPLITEAL BYPASS GRAFT Left 12/31/2018   Procedure: PATCH ANGIOPLASTY REPAIR LEFT FEMORAL ARTERY USING HEMASHIELD PLATINUM FINESSE PATCH;  Surgeon: Rosetta Posner, MD;  Location: [facility];  Service: Vascular;  Laterality: Left;  . IR RADIOLOGY PERIPHERAL GUIDED IV START  05/30/2017  . IR RADIOLOGY PERIPHERAL GUIDED IV START  09/02/2018  . IR RADIOLOGY PERIPHERAL GUIDED IV START  09/02/2018  . IR US GUIDE VASC ACCESS LEFT  09/02/2018  . IR US GUIDE VASC ACCESS RIGHT  05/30/2017  . IR US GUIDE VASC ACCESS RIGHT  09/02/2018  . JOINT REPLACEMENT  2016   sacroplasty  . LAMINECTOMY    . RIGHT/LEFT HEART CATH AND CORONARY ANGIOGRAPHY N/A 11/27/2018   Procedure: RIGHT/LEFT HEART CATH AND CORONARY ANGIOGRAPHY;  Surgeon: Sherren Mocha, MD;  Location: [place] [facility];  Service: Cardiovascular;  Laterality: N/A;  . TEE WITHOUT CARDIOVERSION N/A 12/30/2018   Procedure: TRANSESOPHAGEAL ECHOCARDIOGRAM (TEE);  Surgeon: Sherren Mocha, MD;  Location: [place] [facility];  Service: Open Heart Surgery;  Laterality: N/A;  . TOTAL HIP ARTHROPLASTY Bilateral   .  TRANSCATHETER AORTIC VALVE REPLACEMENT, TRANSFEMORAL N/A 12/30/2018   Procedure: TRANSCATHETER AORTIC VALVE REPLACEMENT, TRANSFEMORAL;  Surgeon: Sherren Mocha, MD;  Location: [place] [facility];  Service: Open Heart Surgery;  Laterality: N/A;  . TUBAL LIGATION  1975     Current Outpatient Medications  Medication Sig Dispense Refill  . acetaminophen (TYLENOL) 325 MG tablet Take 2 tablets (650 mg total) by mouth every 6 (six) hours as needed for mild pain (or Fever >/= 101).    Marland Kitchen albuterol (PROVENTIL HFA;VENTOLIN HFA) 108 (90 Base) MCG/ACT inhaler Inhale 2 puffs into the lungs every 6 (six) hours as needed for wheezing.    Marland Kitchen amLODipine (NORVASC) 2.5 MG tablet Take 1 tablet (2.5 mg total) by mouth daily. 30 tablet 0  . aspirin EC 81 MG tablet Take 81 mg by mouth daily.    . Calcium Carb-Cholecalciferol (CALCIUM 600 + D PO) Take 600 mg by mouth 3 (three) times a week.     . Cholecalciferol (VITAMIN D3) 5000 UNITS CAPS Take 5,000 Units by mouth daily.     Marland Kitchen  denosumab (PROLIA) 60 MG/ML SOLN injection Inject 60 mg into the skin every 6 (six) months. Administer in upper arm, thigh, or abdomen    . doxycycline (VIBRAMYCIN) 100 MG capsule Take 1 capsule (100 mg total) by mouth 2 (two) times daily. One po bid x 7 days 14 capsule 0  . EPINEPHrine 0.3 mg/0.3 mL IJ SOAJ injection Inject 0.3 mg into the muscle as needed for anaphylaxis.   12  . loratadine (CLARITIN) 10 MG tablet Take 10 mg by mouth daily as needed for allergies.     . magnesium oxide (MAG-OX) 400 (241.3 Mg) MG tablet Take 1 tablet (400 mg total) by mouth daily. 90 tablet 1  . metoprolol succinate (TOPROL XL) 25 MG 24 hr tablet Take 1 tablet (25 mg total) by mouth daily. 90 tablet 1  . Multiple Vitamin (MULTIVITAMIN WITH MINERALS) TABS tablet Take 1 tablet by mouth daily.    . pantoprazole (PROTONIX) 20 MG tablet Take 1 tablet (20 mg total) by mouth daily. 14 tablet 0  . promethazine (PHENERGAN) 25 MG tablet Take 1 tablet (25 mg total) by  mouth 2 (two) times daily as needed for nausea or vomiting. 30 tablet 0  . rosuvastatin (CRESTOR) 40 MG tablet TAKE 1 TABLET BY MOUTH EVERY DAY 90 tablet 3   No current facility-administered medications for this visit.     Allergies:   Patient has no known allergies.    Social History:  The patient  reports that she quit smoking about 15 years ago. Her smoking use included cigarettes. She has a 40.00 pack-year smoking history. She has never used smokeless tobacco. She reports current alcohol use of about 3.0 - 4.0 standard drinks of alcohol per week. She reports that she does not use drugs.   Family History:  The patient's family history includes Breast cancer in her mother; Cancer in her brother; Diabetes in her son.    ROS: All other systems are reviewed and negative. Unless otherwise mentioned in H&P    PHYSICAL EXAM: VS:  BP 111/68   Pulse 81   Ht 5\' 6"  (1.676 m)   Wt 158 lb 6.4 oz (71.8 kg)   SpO2 95%   BMI 25.57 kg/m  , BMI Body mass index is 25.57 kg/m. GEN: Well nourished, well developed, in no acute distress HEENT: normal Neck: no JVD, carotid bruits, or masses Cardiac: RRR; no murmurs, rubs, or gallops,no edema  Respiratory:  Clear to auscultation bilaterally, normal work of breathing GI: soft, nontender, nondistended, + BS MS: no deformity or atrophy Skin: warm and dry, no rash Neuro:  Strength and sensation are intact Psych: euthymic mood, full affect   EKG:  EKG is not ordered today.    Recent Labs: 12/26/2018: B Natriuretic Peptide 65.3 04/28/2019: Magnesium 1.8; TSH 1.185 06/08/2019: ALT 35; Hemoglobin 13.0; Platelets 418 06/10/2019: BUN 14; Creatinine, Ser 0.70; Potassium 3.6; Sodium 141    Lipid Panel    Component Value Date/Time   CHOL 139 05/28/2019 1740   TRIG 129 05/28/2019 1740   HDL 54 05/28/2019 1740   CHOLHDL 2.6 05/28/2019 1740   CHOLHDL 2 11/13/2017 1127   VLDL 13.2 11/13/2017 1127   LDLCALC 59 05/28/2019 1740      Wt Readings from  Last 3 Encounters:  06/15/19 158 lb 6.4 oz (71.8 kg)  06/08/19 155 lb 6.8 oz (70.5 kg)  06/02/19 155 lb 6 oz (70.5 kg)      Other studies Reviewed: Carotid Doppler Studies 06/01/2019 Summary: Right Carotid:  Velocities in the right ICA are consistent with a 60-79%                stenosis. The Tardus waveform in the mid ICA distal to the                stenosis indicates greater severity and I advise a CTA to better                define the RICA stenosis. The ECA appears <50% stenosed.  Left Carotid: Velocities in the left ICA are consistent with a 1-39% stenosis.  Vertebrals:  Bilateral vertebral arteries demonstrate antegrade flow. Subclavians: Normal flow hemodynamics were seen in bilateral subclavian              arteries.  Cardiac Monitor 02/16/2019 The basic rhythm is normal sinus There are no bradycardic events There is no atrial fibrillation or flutter There are episodes of a wide complex that appears to be artifactual  Echocardiogram 01/26/2019 1. The left ventricle has normal systolic function with an ejection fraction of 60-65%. The cavity size was normal. Left ventricular diastolic parameters were normal.  2. The right ventricle has normal systolic function. The cavity was normal. There is no increase in right ventricular wall thickness.  3. No evidence present in the left atrial appendage.  4. Mild thickening of the mitral valve leaflet.  5. Post TAVR with 26 mm Medtronic Core Valve Mean gradient low and stable since echo done 12/31/18 and no significant PVL.  6. No pulmonic valve vegetation visualized.  ASSESSMENT AND PLAN:  1.  Atrial fibrillation- 1 episode in the setting of acute cholecystitis and abdominal pain.  This was believed to be due to the physiological stress of her illness.  The only other thing is that she has had were an episode of tachycardia during a hospitalization in June 2020.  A 2-week ZIO monitor showed sinus rhythm and a wide-complex rhythm that  was believed to be artifact.  Regular rate and rhythm today. Continue metoprolol succinate 25 mg tablet daily No anticoagulant recommended at this time.  2.  Essential hypertension- today 111/68 and well controlled at home.  Continue amlodipine 2.5 mg daily Continue metoprolol succinate 25 mg tablet daily Low-sodium heart healthy diet Increase physical activity as tolerated  3.  Status post TAVR- 01/16/2019, repeat echocardiogram March 2021. Recommend SBE prophylaxis  4.  Status post acute ischemia of the left leg- postop from TAVR and required surgical intervention and emergent repair of the femoral artery with endarterectomy and Dacron  patch placement  5.  Hyperlipidemia- 05/28/2019: Cholesterol, Total 139; HDL 54; LDL Calculated 59; Triglycerides 129 Continue rosuvastatin 40 mg tablet daily Monitored by PCP   6.Dizziness/ vomiting- Ed visit 8/10 Continue Phenergan 25 mg tablet 2 times daily as needed Repeat Bmp Increase activity as tolerated.  Current medicines are reviewed at length with the patient today.    Disposition: Follow-up with Dr. [name] in 2 months.  Labs/ tests ordered today include: Bmp  Jossie Ng. Aylssa Herrig NP-C      06/15/2019 3:04 PM    [facility] [place] [address] Office [phone] Fax [phone]

## 2019-06-15 ENCOUNTER — Encounter: Payer: Self-pay | Admitting: Adult Health

## 2019-06-15 ENCOUNTER — Ambulatory Visit (INDEPENDENT_AMBULATORY_CARE_PROVIDER_SITE_OTHER): Payer: Medicare Other | Admitting: General Practice

## 2019-06-15 ENCOUNTER — Other Ambulatory Visit: Payer: Self-pay

## 2019-06-15 VITALS — BP 111/68 | HR 81 | Ht 66.0 in | Wt 158.4 lb

## 2019-06-15 DIAGNOSIS — Z952 Presence of prosthetic heart valve: Secondary | ICD-10-CM | POA: Diagnosis not present

## 2019-06-15 DIAGNOSIS — I480 Paroxysmal atrial fibrillation: Secondary | ICD-10-CM | POA: Diagnosis not present

## 2019-06-15 DIAGNOSIS — R42 Dizziness and giddiness: Secondary | ICD-10-CM | POA: Diagnosis not present

## 2019-06-15 DIAGNOSIS — E785 Hyperlipidemia, unspecified: Secondary | ICD-10-CM | POA: Diagnosis not present

## 2019-06-15 DIAGNOSIS — S75002D Unspecified injury of femoral artery, left leg, subsequent encounter: Secondary | ICD-10-CM | POA: Diagnosis not present

## 2019-06-15 DIAGNOSIS — I10 Essential (primary) hypertension: Secondary | ICD-10-CM

## 2019-06-15 DIAGNOSIS — I2510 Atherosclerotic heart disease of native coronary artery without angina pectoris: Secondary | ICD-10-CM

## 2019-06-15 NOTE — Patient Instructions (Signed)
Medication Instructions:  Continue current medications  If you need a refill on your cardiac medications before your next appointment, please call your pharmacy.  Labwork: BMP Today HERE IN OUR OFFICE AT [facility]  You will NOT need to fast   Take the provided lab slips with you to the lab for your blood draw.   When you have your labs (blood work) drawn today and your tests are completely normal, you will receive your results only by MyChart Message (if you have MyChart) -OR-  A paper copy in the mail.  If you have any lab test that is abnormal or we need to change your treatment, we will call you to review these results.  Testing/Procedures: None Ordered   Follow-Up: . Your physician recommends that you schedule a follow-up appointment in: 2 Months with Dr [name]   At [facility], you and your health needs are our priority.  As part of our continuing mission to provide you with exceptional heart care, we have created designated Provider Care Teams.  These Care Teams include your primary Cardiologist (physician) and Advanced Practice Providers (APPs -  Physician Assistants and Nurse Practitioners) who all work together to provide you with the care you need, when you need it.  Thank you for choosing [facility] at [facility]!!

## 2019-06-16 ENCOUNTER — Other Ambulatory Visit: Payer: Self-pay | Admitting: Internal Medicine

## 2019-06-16 LAB — BASIC METABOLIC PANEL
BUN/Creatinine Ratio: 21 (ref 12–28)
BUN: 15 mg/dL (ref 8–27)
CO2: 22 mmol/L (ref 20–29)
Calcium: 9.6 mg/dL (ref 8.7–10.3)
Chloride: 102 mmol/L (ref 96–106)
Creatinine, Ser: 0.73 mg/dL (ref 0.57–1.00)
GFR calc Af Amer: 91 mL/min/{1.73_m2} (ref >59)
GFR calc non Af Amer: 79 mL/min/{1.73_m2} (ref >59)
Glucose: 106 mg/dL — ABNORMAL HIGH (ref 65–99)
Potassium: 4.3 mmol/L (ref 3.5–5.2)
Sodium: 140 mmol/L (ref 134–144)

## 2019-06-17 ENCOUNTER — Telehealth: Payer: Self-pay | Admitting: Internal Medicine

## 2019-06-17 ENCOUNTER — Other Ambulatory Visit: Payer: Medicare Other

## 2019-06-17 NOTE — Telephone Encounter (Signed)
Called patient to check how she is doing, she is finally doing better, GI symptoms improved.  She has an appointment with me in few days.  She will call me sooner if needed

## 2019-06-22 ENCOUNTER — Ambulatory Visit (INDEPENDENT_AMBULATORY_CARE_PROVIDER_SITE_OTHER): Payer: Medicare Other | Admitting: Internal Medicine

## 2019-06-22 ENCOUNTER — Other Ambulatory Visit: Payer: Self-pay | Admitting: Physician Assistant

## 2019-06-22 ENCOUNTER — Encounter: Payer: Self-pay | Admitting: Internal Medicine

## 2019-06-22 ENCOUNTER — Other Ambulatory Visit: Payer: Self-pay

## 2019-06-22 VITALS — BP 145/74 | HR 72 | Temp 97.2°F | Resp 18 | Ht 66.0 in | Wt 157.4 lb

## 2019-06-22 DIAGNOSIS — G4452 New daily persistent headache (NDPH): Secondary | ICD-10-CM | POA: Diagnosis not present

## 2019-06-22 DIAGNOSIS — R197 Diarrhea, unspecified: Secondary | ICD-10-CM

## 2019-06-22 DIAGNOSIS — I2510 Atherosclerotic heart disease of native coronary artery without angina pectoris: Secondary | ICD-10-CM | POA: Diagnosis not present

## 2019-06-22 DIAGNOSIS — R112 Nausea with vomiting, unspecified: Secondary | ICD-10-CM

## 2019-06-22 NOTE — Progress Notes (Signed)
Subjective:    Patient ID: Dominique [name], female    DOB: Mar 03, 1941, 78 y.o.   MRN: [id number]  DOS:  06/22/2019 Type of visit - description: Follow-up  She had 2 ER visit and other office visits since the last time I saw her  ER 06/02/2019: Was seen with nausea, vomiting and diarrhea. Abdomen was a slightly tender, she was noted to be short of breath, d-dimer was elevated, CT scan showed no pulmonary emboli. Also described: Few patchy areas of ground-glass attenuation in the posterior left upper lobe and right upper lobe, nonspecific, but favored to be infectious or inflammatory in etiology.  CT abdomen and pelvis showed some changes consistent with recent gallbladder surgery.   Blood work unremarkable except for slightly increased creatinine.  COVID-19 testing negative. Was discharged after IV fluids.   ER 06/08/2019 Went to the ER for reevaluation of nausea and vomiting.  Diarrhea  was resolved. Abdominal exam was benign, abdominal x-ray is negative, creatinine improved, she was recommended Protonix, got IV fluids and discharged home.  06/15/2019: Cardiology for a follow-up on A. fib, TVAR, was recommended to continue metoprolol, no anticoagulation needed.   Review of Systems The patient had nausea, vomiting, diarrhea, went to the ER twice, notes reviewed.  She has been gradually getting better in the last few days and today is essentially back to normal Headaches, blurry vision: Seen by neurology, work-up in progress  At this point she denies chest pain, difficulty breathing.  Appetite is good.  No more GI symptoms.  Past Medical History:  Diagnosis Date  . Atrial fibrillation ([place])    a. dx 03/2019 while in hospital with cholecystitis  . COPD (chronic obstructive pulmonary disease) ([place])   . Critical lower limb ischemia    a. after TAVR developed ischemic L leg likely due to flap/closure, s/p emergent repair of left femoral artery with endarterectomy and Dacron patch  angioplasty.  . Emphysema lung ([place])   . Hyperlipidemia    LDL goal < 70  . Hypertension   . Hypomagnesemia   . Normal coronary arteries 10/2018  . NSVT (nonsustained ventricular tachycardia) ([place]) 03/02/2019  . Pulmonary HTN ([place]) 06/05/2016   Moderate with PASP 34mmHg by echo 04/2016 likely Group 3 from COPD and possibly Group 2 from pulmonary venous HTN associated with moderate AS - f/u echo 12/2018 post TAVR showed normal RVSP  . Pulmonary nodules    seen on pre TAVR CT, needs 12 month CT follow up  . S/P TAVR (transcatheter aortic valve replacement) 12/30/2018   Medtronic Evolut Pro-Plus THV (size 26 mm, serial # [id number]) via the TF approach  . Sacral fracture ([place])   . Severe aortic stenosis    a. s/p TAVR 12/2018.  Marland Kitchen Thoracic ascending aortic aneurysm [facility])    needs yearly follow up    Past Surgical History:  Procedure Laterality Date  . APPENDECTOMY  1951  . CATARACT EXTRACTION    . CESAREAN SECTION     1971  . CHOLECYSTECTOMY N/A 04/28/2019   Procedure: LAPAROSCOPIC CHOLECYSTECTOMY;  Surgeon: Stark Klein, MD;  Location: [facility];  Service: General;  Laterality: N/A;  . COLONOSCOPY  2018  . FEMORAL-POPLITEAL BYPASS GRAFT Left 12/31/2018   Procedure: PATCH ANGIOPLASTY REPAIR LEFT FEMORAL ARTERY USING HEMASHIELD PLATINUM FINESSE PATCH;  Surgeon: Rosetta Posner, MD;  Location: [place];  Service: Vascular;  Laterality: Left;  . IR RADIOLOGY PERIPHERAL GUIDED IV START  05/30/2017  . IR RADIOLOGY PERIPHERAL GUIDED IV START  09/02/2018  .  IR RADIOLOGY PERIPHERAL GUIDED IV START  09/02/2018  . IR US GUIDE VASC ACCESS LEFT  09/02/2018  . IR US GUIDE VASC ACCESS RIGHT  05/30/2017  . IR US GUIDE VASC ACCESS RIGHT  09/02/2018  . JOINT REPLACEMENT  2016   sacroplasty  . LAMINECTOMY    . RIGHT/LEFT HEART CATH AND CORONARY ANGIOGRAPHY N/A 11/27/2018   Procedure: RIGHT/LEFT HEART CATH AND CORONARY ANGIOGRAPHY;  Surgeon: Sherren Mocha, MD;  Location: [place] [facility];  Service: Cardiovascular;   Laterality: N/A;  . TEE WITHOUT CARDIOVERSION N/A 12/30/2018   Procedure: TRANSESOPHAGEAL ECHOCARDIOGRAM (TEE);  Surgeon: Sherren Mocha, MD;  Location: [place] [facility];  Service: Open Heart Surgery;  Laterality: N/A;  . TOTAL HIP ARTHROPLASTY Bilateral   . TRANSCATHETER AORTIC VALVE REPLACEMENT, TRANSFEMORAL N/A 12/30/2018   Procedure: TRANSCATHETER AORTIC VALVE REPLACEMENT, TRANSFEMORAL;  Surgeon: Sherren Mocha, MD;  Location: [place] [facility];  Service: Open Heart Surgery;  Laterality: N/A;  . TUBAL LIGATION  1975    Social History   Socioeconomic History  . Marital status: Widowed    Spouse name: Not on file  . Number of children: 2  . Years of education: Not on file  . Highest education level: Not on file  Occupational History  . Occupation: n/a  Social Needs  . Financial resource strain: Not on file  . Food insecurity    Worry: Not on file    Inability: Not on file  . Transportation needs    Medical: Not on file    Non-medical: Not on file  Tobacco Use  . Smoking status: Former Smoker    Packs/day: 1.00    Years: 40.00    Pack years: 40.00    Types: Cigarettes    Quit date: 03/19/2004    Years since quitting: 15.2  . Smokeless tobacco: Never Used  Substance and Sexual Activity  . Alcohol use: Yes    Alcohol/week: 3.0 - 4.0 standard drinks    Types: 3 - 4 Glasses of wine per week    Comment: socially  . Drug use: No  . Sexual activity: Not on file  Lifestyle  . Physical activity    Days per week: Not on file    Minutes per session: Not on file  . Stress: Not on file  Relationships  . Social Herbalist on phone: Not on file    Gets together: Not on file    Attends religious service: Not on file    Active member of club or organization: Not on file    Attends meetings of clubs or organizations: Not on file    Relationship status: Not on file  . Intimate partner violence    Fear of current or ex partner: Not on file    Emotionally abused: Not  on file    Physically abused: Not on file    Forced sexual activity: Not on file  Other Topics Concern  . Not on file  Social History Narrative   From [place]   1 living son      Allergies as of 06/22/2019   No Known Allergies     Medication List       Accurate as of June 22, 2019 11:59 PM. If you have any questions, ask your nurse or doctor.        acetaminophen 325 MG tablet Commonly known as: TYLENOL Take 2 tablets (650 mg total) by mouth every 6 (six) hours as needed for mild pain (  or Fever >/= 101).   albuterol 108 (90 Base) MCG/ACT inhaler Commonly known as: VENTOLIN HFA Inhale 2 puffs into the lungs every 6 (six) hours as needed for wheezing.   amLODipine 2.5 MG tablet Commonly known as: NORVASC Take 1 tablet (2.5 mg total) by mouth daily.   aspirin EC 81 MG tablet Take 81 mg by mouth daily.   CALCIUM 600 + D PO Take 600 mg by mouth 3 (three) times a week.   denosumab 60 MG/ML Soln injection Commonly known as: PROLIA Inject 60 mg into the skin every 6 (six) months. Administer in upper arm, thigh, or abdomen   doxycycline 100 MG capsule Commonly known as: VIBRAMYCIN Take 1 capsule (100 mg total) by mouth 2 (two) times daily. One po bid x 7 days   EPINEPHrine 0.3 mg/0.3 mL Soaj injection Commonly known as: EPI-PEN Inject 0.3 mg into the muscle as needed for anaphylaxis.   loratadine 10 MG tablet Commonly known as: CLARITIN Take 10 mg by mouth daily as needed for allergies.   magnesium oxide 400 (241.3 Mg) MG tablet Commonly known as: MAG-OX Take 1 tablet (400 mg total) by mouth daily.   metoprolol succinate 25 MG 24 hr tablet Commonly known as: Toprol XL Take 1 tablet (25 mg total) by mouth daily.   multivitamin with minerals Tabs tablet Take 1 tablet by mouth daily.   pantoprazole 20 MG tablet Commonly known as: PROTONIX Take 1 tablet (20 mg total) by mouth daily.   promethazine 25 MG tablet Commonly known as: PHENERGAN Take 1 tablet (25  mg total) by mouth 2 (two) times daily as needed for nausea or vomiting.   rosuvastatin 40 MG tablet Commonly known as: CRESTOR TAKE 1 TABLET BY MOUTH EVERY DAY   Vitamin D3 125 MCG (5000 UT) Caps Take 5,000 Units by mouth daily.           Objective:   Physical Exam BP (!) 145/74 (BP Location: Left Arm, Patient Position: Sitting, Cuff Size: Small)   Pulse 72   Temp (!) 97.2 F (36.2 C) (Temporal)   Resp 18   Ht 5\' 6"  (1.676 m)   Wt 157 lb 6 oz (71.4 kg)   SpO2 95%   BMI 25.40 kg/m  General:   Well developed, NAD, BMI noted.  HEENT:  Normocephalic . Face symmetric, atraumatic Lungs:  CTA B Normal respiratory effort, no intercostal retractions, no accessory muscle use. Heart: RRR,  no murmur.  no pretibial edema bilaterally  Abdomen:  Not distended, soft, non-tender. No rebound or rigidity.   Skin: Not pale. Not jaundice Neurologic:  alert & oriented X3.  Speech normal, gait appropriate for age and unassisted Psych--  Cognition and judgment appear intact.  Cooperative with normal attention span and concentration.  Behavior appropriate. No anxious or depressed appearing.     Assessment      Assessment HTN Hyperlipidemia PULM: --Former smoker, quit 2006 --H/o Emphysema, remote -- CT chest angio  05-2016---> pulm nodule x2,reticular infiltrate ; f/u CT 11/2016 stable nodule, no f/u -- CT chest angio  05-2016--->  enlarged R P.A. and Ao ; hac CT coronary 05/2017, f/u per cards CV: ----H/o murmur (since childhood) ----DOE: (-) myoview  8-017, CT angio chest 05-2016:  no PE  -----s/p TVAR 12/2018 ---Paroxysmal A. fib DX 6/28/ 2020 in the context of acute cholecystitis, converted to NSR, not anticoagulated as of 05/25/2019 -MSK --DJD --Sacral Fx, sacral insufficiency, s/p sacroplasty (radiology) 07-2015 --H/o osteoporosis :  took fosamax while  in [place], Fosamax rx again by ortho after sacral Fx 07-2015, never took it, first Prolia 11-14-2015 --T score 08-2015  >> normal (likely normal d/t DJD?) H/o vit D def: Normal level 09-2015   PLAN  Nausea, vomiting, diarrhea: Symptoms resolved, went to the ER twice, see HPI for summary of those visits. On looking back, despite that she tested negative, she could have had COVID-19 given symptoms and  groundglass opacities on CT chest. At this point she is well and no longer contagious per CDC guidelines. HAs : Saw neurology 05/28/2019, R carotid ultrasound 06/01/2019 showed with 60 to 79% blockage on the right side. Those results were discussed with neurology, plan is to give final recommendations once the brain MRI is done. HTN: Well-controlled, last BMP satisfactory H/o Atrial fibrillation: Not an issue at this point. Flu shot next month RTC 4 months CPX   Today, I spent more than  25  min with the patient: >50% of the time counseling regards the recent 2 ER visits, reviewing the chart, explaining her why I suspect she could have had COVID

## 2019-06-22 NOTE — Patient Instructions (Signed)
   GO TO THE FRONT DESK Schedule your next appointment   For a check up in 4 months    

## 2019-06-22 NOTE — Progress Notes (Signed)
Pre visit review using our clinic review tool, if applicable. No additional management support is needed unless otherwise documented below in the visit note. 

## 2019-06-23 DIAGNOSIS — L814 Other melanin hyperpigmentation: Secondary | ICD-10-CM | POA: Diagnosis not present

## 2019-06-23 DIAGNOSIS — D225 Melanocytic nevi of trunk: Secondary | ICD-10-CM | POA: Diagnosis not present

## 2019-06-23 DIAGNOSIS — L820 Inflamed seborrheic keratosis: Secondary | ICD-10-CM | POA: Diagnosis not present

## 2019-06-23 DIAGNOSIS — L821 Other seborrheic keratosis: Secondary | ICD-10-CM | POA: Diagnosis not present

## 2019-06-23 NOTE — Assessment & Plan Note (Signed)
Nausea, vomiting, diarrhea: Symptoms resolved, went to the ER twice, see HPI for summary of those visits. On looking back, despite that she tested negative, she could have had COVID-19 given symptoms and  groundglass opacities on CT chest. At this point she is well and no longer contagious per CDC guidelines. HAs : Saw neurology 05/28/2019, R carotid ultrasound 06/01/2019 showed with 60 to 79% blockage on the right side. Those results were discussed with neurology, plan is to give final recommendations once the brain MRI is done. HTN: Well-controlled, last BMP satisfactory H/o Atrial fibrillation: Not an issue at this point. Flu shot next month RTC 4 months CPX

## 2019-06-24 DIAGNOSIS — Z01419 Encounter for gynecological examination (general) (routine) without abnormal findings: Secondary | ICD-10-CM | POA: Diagnosis not present

## 2019-06-24 DIAGNOSIS — Z6826 Body mass index (BMI) 26.0-26.9, adult: Secondary | ICD-10-CM | POA: Diagnosis not present

## 2019-06-24 DIAGNOSIS — Z1231 Encounter for screening mammogram for malignant neoplasm of breast: Secondary | ICD-10-CM | POA: Diagnosis not present

## 2019-06-24 LAB — HM MAMMOGRAPHY

## 2019-06-24 NOTE — Telephone Encounter (Signed)
Called back to the patient. No answer. Left a message on her voicemail. If she is well and does not feel she has any digestive issues, it is understandable she would want to cancel. She was scheduled urgently due to nausea and vomiting. See the PCP note regarding this most recently.

## 2019-06-24 NOTE — Telephone Encounter (Signed)
Pt reported that she saw Dr. Larose Kells and is currently feeling fine.  Please advise whether to keep appt.

## 2019-06-25 ENCOUNTER — Ambulatory Visit: Payer: Medicare Other | Admitting: Nurse Practitioner

## 2019-06-30 ENCOUNTER — Other Ambulatory Visit: Payer: Self-pay

## 2019-06-30 ENCOUNTER — Ambulatory Visit
Admission: RE | Admit: 2019-06-30 | Discharge: 2019-06-30 | Disposition: A | Discharge status: Home / Self Care | Payer: Medicare Other | Source: Ambulatory Visit | Attending: Neurology | Admitting: Neurology

## 2019-06-30 ENCOUNTER — Ambulatory Visit: Payer: Medicare Other | Admitting: Internal Medicine

## 2019-06-30 DIAGNOSIS — H538 Other visual disturbances: Secondary | ICD-10-CM

## 2019-06-30 MED ORDER — GADOBENATE DIMEGLUMINE 529 MG/ML IV SOLN
15.0000 mL | Freq: Once | INTRAVENOUS | Status: AC | PRN
  Administered 2019-06-30: 15 mL via INTRAVENOUS

## 2019-07-13 ENCOUNTER — Other Ambulatory Visit: Payer: Self-pay | Admitting: Internal Medicine

## 2019-07-13 NOTE — Telephone Encounter (Signed)
Spoke with the patient, she has not been taking benazepril in a while, feels well, BPs are good. No refill

## 2019-07-13 NOTE — Telephone Encounter (Signed)
Refill request for benazepril 20mg - no longer on med list. Please advise.

## 2019-07-14 ENCOUNTER — Ambulatory Visit (INDEPENDENT_AMBULATORY_CARE_PROVIDER_SITE_OTHER): Payer: Medicare Other | Admitting: *Deleted

## 2019-07-14 ENCOUNTER — Other Ambulatory Visit: Payer: Self-pay

## 2019-07-14 DIAGNOSIS — Z23 Encounter for immunization: Secondary | ICD-10-CM | POA: Diagnosis not present

## 2019-07-14 NOTE — Progress Notes (Signed)
Patient here for flu vaccine.  Vaccine given and patient tolerated well.

## 2019-07-29 DIAGNOSIS — H43393 Other vitreous opacities, bilateral: Secondary | ICD-10-CM | POA: Diagnosis not present

## 2019-07-29 DIAGNOSIS — H33321 Round hole, right eye: Secondary | ICD-10-CM | POA: Diagnosis not present

## 2019-07-29 DIAGNOSIS — H524 Presbyopia: Secondary | ICD-10-CM | POA: Diagnosis not present

## 2019-07-29 DIAGNOSIS — H04123 Dry eye syndrome of bilateral lacrimal glands: Secondary | ICD-10-CM | POA: Diagnosis not present

## 2019-07-29 DIAGNOSIS — G43819 Other migraine, intractable, without status migrainosus: Secondary | ICD-10-CM | POA: Diagnosis not present

## 2019-08-19 ENCOUNTER — Other Ambulatory Visit: Payer: Self-pay

## 2019-08-20 ENCOUNTER — Other Ambulatory Visit (HOSPITAL_COMMUNITY)
Admission: RE | Admit: 2019-08-20 | Discharge: 2019-08-20 | Disposition: A | Discharge status: Home / Self Care | Payer: Medicare Other | Source: Ambulatory Visit | Attending: Internal Medicine | Admitting: Internal Medicine

## 2019-08-20 ENCOUNTER — Ambulatory Visit (INDEPENDENT_AMBULATORY_CARE_PROVIDER_SITE_OTHER): Payer: Medicare Other | Admitting: Internal Medicine

## 2019-08-20 ENCOUNTER — Encounter: Payer: Self-pay | Admitting: Internal Medicine

## 2019-08-20 ENCOUNTER — Other Ambulatory Visit: Payer: Self-pay

## 2019-08-20 VITALS — BP 164/71 | HR 73 | Temp 95.9°F | Resp 16 | Ht 66.0 in | Wt 162.0 lb

## 2019-08-20 DIAGNOSIS — N760 Acute vaginitis: Secondary | ICD-10-CM

## 2019-08-20 DIAGNOSIS — I10 Essential (primary) hypertension: Secondary | ICD-10-CM

## 2019-08-20 DIAGNOSIS — I2510 Atherosclerotic heart disease of native coronary artery without angina pectoris: Secondary | ICD-10-CM

## 2019-08-20 DIAGNOSIS — R399 Unspecified symptoms and signs involving the genitourinary system: Secondary | ICD-10-CM

## 2019-08-20 DIAGNOSIS — R55 Syncope and collapse: Secondary | ICD-10-CM | POA: Diagnosis not present

## 2019-08-20 LAB — POC URINALSYSI DIPSTICK (AUTOMATED)
Bilirubin, UA: NEGATIVE
Blood, UA: NEGATIVE
Glucose, UA: NEGATIVE
Ketones, UA: NEGATIVE
Nitrite, UA: NEGATIVE
Protein, UA: NEGATIVE
Spec Grav, UA: 1.01 (ref 1.010–1.025)
Urobilinogen, UA: 0.2 E.U./dL
pH, UA: 6 (ref 5.0–8.0)

## 2019-08-20 LAB — URINALYSIS, ROUTINE W REFLEX MICROSCOPIC
Bilirubin Urine: NEGATIVE
Hgb urine dipstick: NEGATIVE
Ketones, ur: NEGATIVE
Nitrite: NEGATIVE
RBC / HPF: NONE SEEN (ref 0–?)
Specific Gravity, Urine: <=1.005 — AB (ref 1.000–1.030)
Total Protein, Urine: NEGATIVE
Urine Glucose: NEGATIVE
Urobilinogen, UA: 0.2 (ref 0.0–1.0)
pH: 6.5 (ref 5.0–8.0)

## 2019-08-20 MED ORDER — AMOXICILLIN 500 MG PO TABS: ORAL_TABLET | ORAL | 0 refills | Status: DC

## 2019-08-20 MED ORDER — AMLODIPINE BESYLATE 5 MG PO TABS: 5.0000 mg | ORAL_TABLET | Freq: Every day | ORAL | 6 refills | Status: DC

## 2019-08-20 NOTE — Progress Notes (Signed)
Subjective:    Patient ID: Dominique [name], female    DOB: [birth year], 78 y.o.   MRN: [id number]  DOS:  08/20/2019 Type of visit - description: Acute, multiple symptoms 2 weeks history of vaginal itching without discharge or rash.  Also having urinary frequency for 4 weeks, no gross hematuria, some dysuria. Wonders if above sxs d/t DM  Also reports she "almost pass out" 3 times in the last 3 to 4 weeks. Typically she is a standing and suddenly has the feeling that she is falling, gets dizzy, needs to grab onto something; in a couple of occasions she was helped by friends and sat down, symptoms subsided within a couple of minutes. No LOC No chest pain or palpitations No nausea or vomiting No bladder or bowel incontinence No headaches I asked about visual disturbances and she reports that that is a chronic issue on and off but no necessarily related with the episodes described above.  Request a prescription for amoxicillin for SBE prophylaxis   BP Readings from Last 3 Encounters:  08/20/19 (!) 164/71  06/22/19 (!) 145/74  06/15/19 111/68     Review of Systems  As above  Past Medical History:  Diagnosis Date  . Atrial fibrillation ([place])    a. dx 03/2019 while in hospital with cholecystitis  . COPD (chronic obstructive pulmonary disease) ([place])   . Critical lower limb ischemia    a. after TAVR developed ischemic L leg likely due to flap/closure, s/p emergent repair of left femoral artery with endarterectomy and Dacron patch angioplasty.  . Emphysema lung ([place])   . Hyperlipidemia    LDL goal < 70  . Hypertension   . Hypomagnesemia   . Normal coronary arteries 10/2018  . NSVT (nonsustained ventricular tachycardia) ([place]) 03/02/2019  . Pulmonary HTN ([place]) 06/05/2016   Moderate with PASP 61mmHg by echo 04/2016 likely Group 3 from COPD and possibly Group 2 from pulmonary venous HTN associated with moderate AS - f/u echo 12/2018 post TAVR showed normal RVSP  . Pulmonary nodules    seen on pre TAVR CT, needs 12 month CT follow up  . S/P TAVR (transcatheter aortic valve replacement) 12/30/2018   Medtronic Evolut Pro-Plus THV (size 26 mm, serial # [id number]) via the TF approach  . Sacral fracture ([place])   . Severe aortic stenosis    a. s/p TAVR 12/2018.  Marland Kitchen Thoracic ascending aortic aneurysm [facility])    needs yearly follow up    Past Surgical History:  Procedure Laterality Date  . APPENDECTOMY  1951  . CATARACT EXTRACTION    . CESAREAN SECTION     1971  . CHOLECYSTECTOMY N/A 04/28/2019   Procedure: LAPAROSCOPIC CHOLECYSTECTOMY;  Surgeon: Stark Klein, MD;  [facility];  Service: General;  Laterality: N/A;  . COLONOSCOPY  2018  . FEMORAL-POPLITEAL BYPASS GRAFT Left 12/31/2018   Procedure: PATCH ANGIOPLASTY REPAIR LEFT FEMORAL ARTERY USING HEMASHIELD PLATINUM FINESSE PATCH;  Surgeon: Rosetta Posner, MD;  Location: [place];  Service: Vascular;  Laterality: Left;  . IR RADIOLOGY PERIPHERAL GUIDED IV START  05/30/2017  . IR RADIOLOGY PERIPHERAL GUIDED IV START  09/02/2018  . IR RADIOLOGY PERIPHERAL GUIDED IV START  09/02/2018  . IR US GUIDE VASC ACCESS LEFT  09/02/2018  . IR US GUIDE VASC ACCESS RIGHT  05/30/2017  . IR US GUIDE VASC ACCESS RIGHT  09/02/2018  . JOINT REPLACEMENT  2016   sacroplasty  . LAMINECTOMY    . RIGHT/LEFT HEART CATH AND CORONARY ANGIOGRAPHY  N/A 11/27/2018   Procedure: RIGHT/LEFT HEART CATH AND CORONARY ANGIOGRAPHY;  Surgeon: Sherren Mocha, MD;  Location: [place] [facility];  Service: Cardiovascular;  Laterality: N/A;  . TEE WITHOUT CARDIOVERSION N/A 12/30/2018   Procedure: TRANSESOPHAGEAL ECHOCARDIOGRAM (TEE);  Surgeon: Sherren Mocha, MD;  Location: [place] [facility];  Service: Open Heart Surgery;  Laterality: N/A;  . TOTAL HIP ARTHROPLASTY Bilateral   . TRANSCATHETER AORTIC VALVE REPLACEMENT, TRANSFEMORAL N/A 12/30/2018   Procedure: TRANSCATHETER AORTIC VALVE REPLACEMENT, TRANSFEMORAL;  Surgeon: Sherren Mocha, MD;  Location: [place] [facility];  Service:  Open Heart Surgery;  Laterality: N/A;  . TUBAL LIGATION  1975    Social History   Socioeconomic History  . Marital status: Widowed    Spouse name: Not on file  . Number of children: 2  . Years of education: Not on file  . Highest education level: Not on file  Occupational History  . Occupation: n/a  Social Needs  . Financial resource strain: Not on file  . Food insecurity    Worry: Not on file    Inability: Not on file  . Transportation needs    Medical: Not on file    Non-medical: Not on file  Tobacco Use  . Smoking status: Former Smoker    Packs/day: 1.00    Years: 40.00    Pack years: 40.00    Types: Cigarettes    Quit date: 03/19/2004    Years since quitting: 15.4  . Smokeless tobacco: Never Used  Substance and Sexual Activity  . Alcohol use: Yes    Alcohol/week: 3.0 - 4.0 standard drinks    Types: 3 - 4 Glasses of wine per week    Comment: socially  . Drug use: No  . Sexual activity: Not on file  Lifestyle  . Physical activity    Days per week: Not on file    Minutes per session: Not on file  . Stress: Not on file  Relationships  . Social Herbalist on phone: Not on file    Gets together: Not on file    Attends religious service: Not on file    Active member of club or organization: Not on file    Attends meetings of clubs or organizations: Not on file    Relationship status: Not on file  . Intimate partner violence    Fear of current or ex partner: Not on file    Emotionally abused: Not on file    Physically abused: Not on file    Forced sexual activity: Not on file  Other Topics Concern  . Not on file  Social History Narrative   From [place]   1 living son      Allergies as of 08/20/2019   No Known Allergies     Medication List       Accurate as of August 20, 2019  8:36 PM. If you have any questions, ask your nurse or doctor.        acetaminophen 325 MG tablet Commonly known as: TYLENOL Take 2 tablets (650 mg total) by  mouth every 6 (six) hours as needed for mild pain (or Fever >/= 101).   albuterol 108 (90 Base) MCG/ACT inhaler Commonly known as: VENTOLIN HFA Inhale 2 puffs into the lungs every 6 (six) hours as needed for wheezing.   amLODipine 5 MG tablet Commonly known as: NORVASC Take 1 tablet (5 mg total) by mouth daily. What changed:   medication strength  how much  to take Changed by: Kathlene November, MD   amoxicillin 500 MG tablet Commonly known as: AMOXIL Take 4 tablets (2,000mg ) one hour prior to all dental visits. Started by: Kathlene November, MD   aspirin EC 81 MG tablet Take 81 mg by mouth daily.   CALCIUM 600 + D PO Take 600 mg by mouth 3 (three) times a week.   denosumab 60 MG/ML Soln injection Commonly known as: PROLIA Inject 60 mg into the skin every 6 (six) months. Administer in upper arm, thigh, or abdomen   doxycycline 100 MG capsule Commonly known as: VIBRAMYCIN Take 1 capsule (100 mg total) by mouth 2 (two) times daily. One po bid x 7 days   EPINEPHrine 0.3 mg/0.3 mL Soaj injection Commonly known as: EPI-PEN Inject 0.3 mg into the muscle as needed for anaphylaxis.   loratadine 10 MG tablet Commonly known as: CLARITIN Take 10 mg by mouth daily as needed for allergies.   magnesium oxide 400 (241.3 Mg) MG tablet Commonly known as: MAG-OX Take 1 tablet (400 mg total) by mouth daily.   metoprolol succinate 25 MG 24 hr tablet Commonly known as: Toprol XL Take 1 tablet (25 mg total) by mouth daily.   multivitamin with minerals Tabs tablet Take 1 tablet by mouth daily.   pantoprazole 20 MG tablet Commonly known as: PROTONIX Take 1 tablet (20 mg total) by mouth daily.   promethazine 25 MG tablet Commonly known as: PHENERGAN Take 1 tablet (25 mg total) by mouth 2 (two) times daily as needed for nausea or vomiting.   rosuvastatin 40 MG tablet Commonly known as: CRESTOR TAKE 1 TABLET BY MOUTH EVERY DAY   Vitamin D3 125 MCG (5000 UT) Caps Take 5,000 Units by mouth daily.            Objective:   Physical Exam BP (!) 164/71 (BP Location: Left Arm, Patient Position: Sitting, Cuff Size: Small)   Pulse 73   Temp (!) 95.9 F (35.5 C) (Temporal)   Resp 16   Ht 5\' 6"  (1.676 m)   Wt 162 lb (73.5 kg)   SpO2 93%   BMI 26.15 kg/m  General:   Well developed, NAD, BMI noted.  HEENT:  Normocephalic . Face symmetric, atraumatic Lungs:  CTA B Normal respiratory effort, no intercostal retractions, no accessory muscle use. Heart: RRR,  no murmur.  no pretibial edema bilaterally  Abdomen:  Not distended, soft, non-tender. No rebound or rigidity.   Skin: Not pale. Not jaundice Neurologic:  alert & oriented X3.  Speech normal, gait appropriate for age and unassisted. Motor DTR symmetric EOMI  psych--  Cognition and judgment appear intact.  Cooperative with normal attention span and concentration.  Behavior appropriate. No anxious or depressed appearing.     Assessment     Assessment HTN Hyperlipidemia PULM: --H/o Emphysema, quit tobacco 2006 -- CT chest angio  05-2016---> pulm nodule x2,reticular infiltrate ; f/u CT 11/2016 stable nodule, no f/u -- CT chest angio  05-2016--->  enlarged R P.A. and Ao ; hac CT coronary 05/2017, f/u per cards CV: ----H/o murmur (since childhood) ----DOE: Brantley Fling  8-017, CT angio chest 05-2016:  no PE  -----s/p TVAR 12/2018 ---Paroxysmal A. fib DX 6/28/ 2020 in the context of acute cholecystitis, converted to NSR, not anticoagulated as of 05/25/2019 -MSK --DJD --Sacral Fx, sacral insufficiency, s/p sacroplasty (radiology) 07-2015 --H/o osteoporosis :  took fosamax while in [place], Fosamax rx again by ortho after sacral Fx 07-2015, never took it, first Prolia  11-14-2015 --T score 08-2015 >> normal (likely normal d/t DJD?) H/o vit D def: Normal level 09-2015   PLAN  Multiple issues Presyncope: As described above, etiology not completely clear.  EKG today NSR.  DDX is large, arrhythmia, TIA, vertigo.  Recent BMP,  CBC normal. Plan: ER if symptoms severe, to see cardiology 09/07/2019, will try to get a sooner appointment.  Also has an appointment with neurology UTI, vaginitis ?: Udip trace leukocyte, check a UA urine culture, Rx w/ results ;  recommend to see gynecology as well SBE prophylaxis: Amoxicillin prescription sent Patient concerned about diabetes, last A1c 5.6. HTN: BP slightly elevated for the second time, increase amlodipine to 5 mg, continue metoprolol, check ambulatory BPs.  Headaches: Saw neurology 05/28/2019 R carotid US 05-2019 showed 60-79%  blockages.  Subsequently had a MRI of the brain with small vessel ischemic changes and MRA of the brain which was normal.  Currently with no headaches RTC:  Already set up for December

## 2019-08-20 NOTE — Progress Notes (Signed)
Pre visit review using our clinic review tool, if applicable. No additional management support is needed unless otherwise documented below in the visit note. 

## 2019-08-20 NOTE — Assessment & Plan Note (Signed)
Multiple issues Presyncope: As described above, etiology not completely clear.  EKG today NSR.  DDX is large, arrhythmia, TIA, vertigo.  Recent BMP, CBC normal. Plan: ER if symptoms severe, to see cardiology 09/07/2019, will try to get a sooner appointment.  Also has an appointment with neurology UTI, vaginitis ?: Udip trace leukocyte, check a UA urine culture, Rx w/ results ;  recommend to see gynecology as well SBE prophylaxis: Amoxicillin prescription sent Patient concerned about diabetes, last A1c 5.6. HTN: BP slightly elevated for the second time, increase amlodipine to 5 mg, continue metoprolol, check ambulatory BPs.  Headaches: Saw neurology 05/28/2019 R carotid US 05-2019 showed 60-79%  blockages.  Subsequently had a MRI of the brain with small vessel ischemic changes and MRA of the brain which was normal.  Currently with no headaches RTC:  Already set up for December

## 2019-08-20 NOTE — Patient Instructions (Addendum)
Please go to the ER if you continue having these near syncope episodes  Keep yourself hydrated  Please see your gynecologist  Your BP is a slightly elevated, increase amlodipine to 5 mg    Check the  blood pressure 2 or 3 times a week BP GOAL is between 110/65 and  135/85. If it is consistently higher or lower, let me know   See you in December

## 2019-08-21 DIAGNOSIS — N760 Acute vaginitis: Secondary | ICD-10-CM | POA: Diagnosis not present

## 2019-08-21 DIAGNOSIS — R350 Frequency of micturition: Secondary | ICD-10-CM | POA: Diagnosis not present

## 2019-08-21 LAB — URINE CULTURE
MICRO NUMBER:: 1018684
SPECIMEN QUALITY:: ADEQUATE

## 2019-08-24 ENCOUNTER — Encounter: Payer: Self-pay | Admitting: Internal Medicine

## 2019-08-24 LAB — URINE CYTOLOGY ANCILLARY ONLY
Bacterial Vaginitis-Urine: NEGATIVE
Candida Urine: NEGATIVE

## 2019-08-31 ENCOUNTER — Ambulatory Visit: Payer: Medicare Other | Admitting: Neurology

## 2019-09-03 ENCOUNTER — Other Ambulatory Visit: Payer: Self-pay | Admitting: Cardiovascular Disease

## 2019-09-03 NOTE — Telephone Encounter (Signed)
Please advise if ok to refill Magnesium 400 mg tablet qd.

## 2019-09-03 NOTE — Telephone Encounter (Signed)
Message sent to scheduling to call and schedule appt

## 2019-09-07 ENCOUNTER — Ambulatory Visit (INDEPENDENT_AMBULATORY_CARE_PROVIDER_SITE_OTHER): Payer: Medicare Other | Admitting: Cardiovascular Disease

## 2019-09-07 ENCOUNTER — Other Ambulatory Visit: Payer: Self-pay

## 2019-09-07 ENCOUNTER — Encounter: Payer: Self-pay | Admitting: Cardiovascular Disease

## 2019-09-07 VITALS — BP 124/56 | HR 76 | Temp 96.4°F | Ht 66.0 in | Wt 167.0 lb

## 2019-09-07 DIAGNOSIS — Z952 Presence of prosthetic heart valve: Secondary | ICD-10-CM | POA: Diagnosis not present

## 2019-09-07 DIAGNOSIS — I10 Essential (primary) hypertension: Secondary | ICD-10-CM

## 2019-09-07 DIAGNOSIS — R55 Syncope and collapse: Secondary | ICD-10-CM

## 2019-09-07 DIAGNOSIS — I6523 Occlusion and stenosis of bilateral carotid arteries: Secondary | ICD-10-CM

## 2019-09-07 DIAGNOSIS — E7800 Pure hypercholesterolemia, unspecified: Secondary | ICD-10-CM | POA: Diagnosis not present

## 2019-09-07 DIAGNOSIS — R42 Dizziness and giddiness: Secondary | ICD-10-CM | POA: Diagnosis not present

## 2019-09-07 NOTE — Patient Instructions (Addendum)
Medication Instructions:  Your physician recommends that you continue on your current medications as directed. Please refer to the Current Medication list given to you today.  *If you need a refill on your cardiac medications before your next appointment, please call your pharmacy*  Lab Work: NONE   Testing/Procedures: Your physician has requested that you have a carotid duplex. This test is an ultrasound of the carotid arteries in your neck. It looks at blood flow through these arteries that supply the brain with blood. Allow one hour for this exam. There are no restrictions or special instructions. IN 6 MONTHS   Follow-Up: At [facility], you and your health needs are our priority.  As part of our continuing mission to provide you with exceptional heart care, we have created designated Provider Care Teams.  These Care Teams include your primary Cardiologist (physician) and Advanced Practice Providers (APPs -  Physician Assistants and Nurse Practitioners) who all work together to provide you with the care you need, when you need it.  Your next appointment:   6 months  AFTER YOUR CAROTID DOPPLER You will receive a reminder letter in the mail two months in advance. If you don't receive a letter, please call our office to schedule the follow-up appointment.  The format for your next appointment:   Either In Person or Virtual  Provider:   You may see [name], MD  or one of the following Advanced Practice Providers on your designated Care Team:    Kerin Ransom, PA-C  [place], [place]  Coletta Memos, [place]   A MESSAGE [place] TO SEE ABOUT [place]. IF YOU DO NOT HEAR FROM THEM IN 2 WEEKS CALL THE OFFICE AT [phone]

## 2019-09-07 NOTE — Progress Notes (Signed)
Cardiology Office Note   Date:  09/07/2019   ID:  Dominique [name], DOB Sep 18, 1941, MRN [id number]  PCP:  Colon Branch, MD  Cardiologist:   Fransico Him, MD  Chief Complaint  Patient presents with  . Follow-up    2 months.      History of Present Illness: Dominique [name] is a 78 y.o. female with severe aortic stenosis s/p TAVR, non-obstructive CAD, carotid stenosis, hyperlipidemia, moderate TR, mild pulmonary hypertension, hypertension, and severe COPD here for follow-up.Dominique [name] saw Dr. Radford Pax on 05/2017. She was noted to have moderate aortic stenosis (peak velocity 3.1 m/s, mean gradient 21 mmHg). She had a chest CT 11/2016 that showed coronary calcifications. She subsequently had a coronary CT-A 05/2017 that revealed less than 50% plaque in the proximal LAD and a mild ascending aortic aneurysm(3.9 cm). She was noted to have calcification of the aorta and aortic valve. Echo 05/31/17 that revealed LVEF 60-65% with normal diastolic function. She had a repeat echo 05/2018 that showed her mean aortic valve gradient increased to 36 mmHg.She continued to report chest discomfort so she had a repeat coronary CT-A 08/2018 that revealed 25-49%stenosis of the proximal LAD unchanged from prior.   She reported increasing exertional dyspnea and chest discomfort.  She was referred to Dr. [name] and underwent TAVR 52mm Medtronic CoreValve Evolut Proon 12/2018.  Postoperative echo revealed a mean gradient of 6 mmHg.  However this procedure was complicated by an ischemic left leg.  She went to the OR emergently with Dr. [name] for repair of the left femoral artery with endarterectomy and dacryon patch angioplasty.  She reported shortness of breath at the procedure and was seen by Dr. Harrington Challenger on 3/30.  By that time her breathing had improved.  Her symptoms were thought to be more related to seasonal allergies and her COPD.  She has noted some intermittent palpitations and wore an ambulatory monitor last  month.  The results are not yet available.  While on the monitor she was noted to have a 7-second run of ventricular tachycardia.  She was asymptomatic and making a cake at the time.  She does note some palpitations when she gets anxious.  Yesterday she got lost and could not figure out how to use her GPS.  Her heart was pounding when this occurred.  She also sometimes has palpitations when lying in bed.  She has not experienced any shortness of breath or chest pain.  She had a cardiac catheterization prior to TAVR that showed normal coronary arteries.    Since her last appointment Dominique [name] required a laparoscopic cholecystectomy 03/2019.  She is recovering from that well.  She saw Dr. Larose Kells on 10/22 and amlodipine was increased due to poorly controlled blood pressure.  Her blood pressure has been well controlled since then.  She does report some episodes of presyncope.  It seems to occur randomly.  In the last several weeks she has had 3 events where one time she was standing at her kitchen sink and felt dizzy for a few seconds.  The other time occurred while sitting in a third occurred while standing at a museum.  She denies orthostatic symptoms.  She did not have any palpitations at the time.  She has not experienced any chest pain.  She does report that she still feels short of breath with exertion.  This never completely resolved after her surgery.  She was initially enrolled in cardiac rehab but was unable to complete much  of it due to closure for the coronavirus.  She would like to get back enrolled if possible.  She denies any lower extremity edema, orthopnea, or PND.  She denies any syncope.  She saw Dr. Larose Kells 05/2019 and reported symptoms concerning for TIA.  She had bilateral carotid Dopplers that showed 60 to 79% right ICA stenosis and 1 to 39% stenosis on the left.  She was referred to neurology and had an MRI of the brain 06/30/2019 that showed chronic small vessel changes but no acute findings.  For the  last four weeks she has started feeling more like herself.  She hasn't been exercising much but hopes to get back in the cardiac rehab program.   Past Medical History:  Diagnosis Date  . Atrial fibrillation ([place])    a. dx 03/2019 while in hospital with cholecystitis  . COPD (chronic obstructive pulmonary disease) ([place])   . Critical lower limb ischemia    a. after TAVR developed ischemic L leg likely due to flap/closure, s/p emergent repair of left femoral artery with endarterectomy and Dacron patch angioplasty.  . Emphysema lung ([place])   . Hyperlipidemia    LDL goal < 70  . Hypertension   . Hypomagnesemia   . Normal coronary arteries 10/2018  . NSVT (nonsustained ventricular tachycardia) ([place]) 03/02/2019  . Pulmonary HTN ([place]) 06/05/2016   Moderate with PASP 50mmHg by echo 04/2016 likely Group 3 from COPD and possibly Group 2 from pulmonary venous HTN associated with moderate AS - f/u echo 12/2018 post TAVR showed normal RVSP  . Pulmonary nodules    seen on pre TAVR CT, needs 12 month CT follow up  . S/P TAVR (transcatheter aortic valve replacement) 12/30/2018   Medtronic Evolut Pro-Plus THV (size 26 mm, serial # [id number]) via the TF approach  . Sacral fracture ([place])   . Severe aortic stenosis    a. s/p TAVR 12/2018.  Marland Kitchen Thoracic ascending aortic aneurysm [facility])    needs yearly follow up    Past Surgical History:  Procedure Laterality Date  . APPENDECTOMY  1951  . CATARACT EXTRACTION    . CESAREAN SECTION     1971  . CHOLECYSTECTOMY N/A 04/28/2019   Procedure: LAPAROSCOPIC CHOLECYSTECTOMY;  Surgeon: Stark Klein, MD;  Location: [facility];  Service: General;  Laterality: N/A;  . COLONOSCOPY  2018  . FEMORAL-POPLITEAL BYPASS GRAFT Left 12/31/2018   Procedure: PATCH ANGIOPLASTY REPAIR LEFT FEMORAL ARTERY USING HEMASHIELD PLATINUM FINESSE PATCH;  Surgeon: Rosetta Posner, MD;  Location: [facility];  Service: Vascular;  Laterality: Left;  . IR RADIOLOGY PERIPHERAL GUIDED IV START  05/30/2017  . IR  RADIOLOGY PERIPHERAL GUIDED IV START  09/02/2018  . IR RADIOLOGY PERIPHERAL GUIDED IV START  09/02/2018  . IR US GUIDE VASC ACCESS LEFT  09/02/2018  . IR US GUIDE VASC ACCESS RIGHT  05/30/2017  . IR US GUIDE VASC ACCESS RIGHT  09/02/2018  . JOINT REPLACEMENT  2016   sacroplasty  . LAMINECTOMY    . RIGHT/LEFT HEART CATH AND CORONARY ANGIOGRAPHY N/A 11/27/2018   Procedure: RIGHT/LEFT HEART CATH AND CORONARY ANGIOGRAPHY;  Surgeon: Sherren Mocha, MD;  Location: [facility];  Service: Cardiovascular;  Laterality: N/A;  . TEE WITHOUT CARDIOVERSION N/A 12/30/2018   Procedure: TRANSESOPHAGEAL ECHOCARDIOGRAM (TEE);  Surgeon: Sherren Mocha, MD;  Location: [facility];  Service: Open Heart Surgery;  Laterality: N/A;  . TOTAL HIP ARTHROPLASTY Bilateral   . TRANSCATHETER AORTIC VALVE REPLACEMENT, TRANSFEMORAL N/A 12/30/2018   Procedure:  TRANSCATHETER AORTIC VALVE REPLACEMENT, TRANSFEMORAL;  Surgeon: Sherren Mocha, MD;  Location: [facility];  Service: Open Heart Surgery;  Laterality: N/A;  . TUBAL LIGATION  1975     Current Outpatient Medications  Medication Sig Dispense Refill  . acetaminophen (TYLENOL) 325 MG tablet Take 2 tablets (650 mg total) by mouth every 6 (six) hours as needed for mild pain (or Fever >/= 101).    Marland Kitchen albuterol (PROVENTIL HFA;VENTOLIN HFA) 108 (90 Base) MCG/ACT inhaler Inhale 2 puffs into the lungs every 6 (six) hours as needed for wheezing.    Marland Kitchen amLODipine (NORVASC) 5 MG tablet Take 1 tablet (5 mg total) by mouth daily. 30 tablet 6  . amoxicillin (AMOXIL) 500 MG tablet Take 4 tablets (2,000mg ) one hour prior to all dental visits. 28 tablet 0  . aspirin EC 81 MG tablet Take 81 mg by mouth daily.    . Calcium Carb-Cholecalciferol (CALCIUM 600 + D PO) Take 600 mg by mouth 3 (three) times a week.     . Cholecalciferol (VITAMIN D3) 5000 UNITS CAPS Take 5,000 Units by mouth daily.     Marland Kitchen denosumab (PROLIA) 60 MG/ML SOLN injection Inject 60 mg into the skin every 6  (six) months. Administer in upper arm, thigh, or abdomen    . EPINEPHrine 0.3 mg/0.3 mL IJ SOAJ injection Inject 0.3 mg into the muscle as needed for anaphylaxis.   12  . loratadine (CLARITIN) 10 MG tablet Take 10 mg by mouth daily as needed for allergies.     . magnesium oxide (MAG-OX) 400 MG tablet Take 1 tablet (400 mg total) by mouth daily. PT OVERDUE FOR [place] OV PLEASE CALL FOR APPT 30 tablet 0  . metoprolol succinate (TOPROL XL) 25 MG 24 hr tablet Take 1 tablet (25 mg total) by mouth daily. 90 tablet 1  . Multiple Vitamin (MULTIVITAMIN WITH MINERALS) TABS tablet Take 1 tablet by mouth daily.    . rosuvastatin (CRESTOR) 40 MG tablet TAKE 1 TABLET BY MOUTH EVERY DAY 90 tablet 3   No current facility-administered medications for this visit.     Allergies:   Patient has no known allergies.    Social History:  The patient  reports that she quit smoking about 15 years ago. Her smoking use included cigarettes. She has a 40.00 pack-year smoking history. She has never used smokeless tobacco. She reports current alcohol use of about 3.0 - 4.0 standard drinks of alcohol per week. She reports that she does not use drugs.   Family History:  The patient's family history includes Breast cancer in her mother; Cancer in her brother; Diabetes in her son.    ROS:  Please see the history of present illness.   Otherwise, review of systems are positive for none.   All other systems are reviewed and negative.    PHYSICAL EXAM: VS:  BP (!) 124/56 (BP Location: Left Arm, Patient Position: Sitting, Cuff Size: Normal)   Pulse 76   Temp (!) 96.4 F (35.8 C)   Ht 5\' 6"  (1.676 m)   Wt 167 lb (75.8 kg)   BMI 26.95 kg/m  , BMI Body mass index is 26.95 kg/m. GENERAL:  Well appearing HEENT: Pupils equal round and reactive, fundi not visualized, oral mucosa unremarkable NECK:  No jugular venous distention, waveform within normal limits, carotid upstroke brisk and symmetric, no bruits LUNGS:  Clear to  auscultation bilaterally HEART:  RRR.  PMI not displaced or sustained,S1 and S2 within normal limits, no S3,  no S4, no clicks, no rubs, III/VI systolic murmur at the  LUSB ABD:  Flat, positive bowel sounds normal in frequency in pitch, no bruits, no rebound, no guarding, no midline pulsatile mass, no hepatomegaly, no splenomegaly EXT:  2 plus pulses throughout, no edema, no cyanosis no clubbing SKIN:  No rashes no nodules NEURO:  Cranial nerves II through XII grossly intact, motor grossly intact throughout PSYCH:  Cognitively intact, oriented to person place and time  EKG:  EKG is not ordered today.  Coronary CT-A 05/30/17: IMPRESSION: 1) Calcium score 64 which is 25 th percentile for age and sex  2) Left dominant coronary arteries with less than 50% calcified stenosis in proximal LAD  3) Aortic root dilatation 3.9 cm  4) Aortic and Aortic valve calcification  Coronary CT-A 09/02/18: IMPRESSION: 1. Coronary calcium score of 81.6. This was 49th percentile for age and sex matched control.  2. Normal coronary origin with left dominance.  3.  Non-obstructive CAD in the proximal LAD, unchanged from 05/2017.  Echo 05/31/17: Study Conclusions  - Left ventricle: The cavity size was normal. Systolic function was   normal. The estimated ejection fraction was in the range of 60%   to 65%. Wall motion was normal; there were no regional wall   motion abnormalities. Left ventricular diastolic function   parameters were normal. - Aortic valve: Severely calcified annulus. Trileaflet; mildly   thickened, moderately calcified leaflets. There was moderate   stenosis. Peak velocity (S): 309 cm/s. Mean gradient (S): 21 mm   Hg. Valve area (VTI): 0.86 cm^2. Valve area (Vmax): 0.89 cm^2.   Valve area (Vmean): 0.93 cm^2. - Mitral valve: There was mild regurgitation. - Pulmonary arteries: PA peak pressure: 34 mm Hg (S).  Echo 06/13/18: Study Conclusions  - Left ventricle: The cavity size was  normal. Wall thickness was   normal. Systolic function was vigorous. The estimated ejection   fraction was in the range of 65% to 70%. Wall motion was normal;   there were no regional wall motion abnormalities. Doppler   parameters are consistent with abnormal left ventricular   relaxation (grade 1 diastolic dysfunction). - Aortic valve: Trileaflet; moderately thickened, moderately   calcified leaflets. Valve mobility was restricted. There was   moderate stenosis. Peak velocity (S): 377 cm/s. Mean gradient   (S): 36 mm Hg. Peak gradient (S): 57 mm Hg. - Pulmonary arteries: Systolic pressure was mildly increased. PA   peak pressure: 37 mm Hg (S).  Echo 12/31/18 IMPRESSIONS 1. The left ventricle has hyperdynamic systolic function, with an ejection fraction of >65%. The cavity size was normal. There is moderately increased left ventricular wall thickness. Left ventricular diastolic Doppler parameters are consistent with  impaired relaxation Indeterminent filling pressures No evidence of left ventricular regional wall motion abnormalities. 2. The right ventricle has normal systolic function. The cavity was normal. There is no increase in right ventricular wall thickness. 3. Left atrial size was mildly dilated. 4. The mitral valve is degenerative. Mild calcification of the mitral valve leaflet. 5. The aortic root and ascending aorta are normal in size and structure. 6. The inferior vena cava was dilated in size with <50% respiratory variability. 7. No evidence of left ventricular regional wall motion abnormalities. 8. When compared to the prior study: 10/30/2018 - 60-65%, severe AS.  Aortic Valve: The aortic valve has been repaired/replaced Aortic valve regurgitation was not visualized by color flow Doppler. There is no evidence of aortic valve stenosis.  _____________   Echo 01/26/19  IMPRESSIONS 1. The left ventricle has normal systolic function with an ejection fraction of  60-65%. The cavity size was normal. Left ventricular diastolic parameters were normal. 2. The right ventricle has normal systolic function. The cavity was normal. There is no increase in right ventricular wall thickness. 3. No evidence present in the left atrial appendage. 4. Mild thickening of the mitral valve leaflet. 5. Post TAVR with 26 mm Medtronic Core Valve Mean gradient low and stable since echo done 12/31/18 and no significant PVL. 6. No pulmonic valve vegetation visualized.   Recent Labs: 12/26/2018: B Natriuretic Peptide 65.3 04/28/2019: Magnesium 1.8; TSH 1.185 06/08/2019: ALT 35; Hemoglobin 13.0; Platelets 418 06/15/2019: BUN 15; Creatinine, Ser 0.73; Potassium 4.3; Sodium 140    Lipid Panel    Component Value Date/Time   CHOL 139 05/28/2019 1740   TRIG 129 05/28/2019 1740   HDL 54 05/28/2019 1740   CHOLHDL 2.6 05/28/2019 1740   CHOLHDL 2 11/13/2017 1127   VLDL 13.2 11/13/2017 1127   LDLCALC 59 05/28/2019 1740      Wt Readings from Last 3 Encounters:  09/07/19 167 lb (75.8 kg)  08/20/19 162 lb (73.5 kg)  06/22/19 157 lb 6 oz (71.4 kg)      ASSESSMENT AND PLAN:  # Carotid Stenosis:  60-79% R ICA stenosis.  This is increased from 1-39% pre-TAVR.  Will repeat carotid Doppler in 6 months.  No CVA on brain MRI.  Conitnue aspirin and rosuvastatin.   # Hyperlipidemia: LDL 59 on 04/2019.  Continue rosuvastatin.  # s/p TAVR:  Dominique [name] had a 26 mm Medronic Evolut Pro-Plus TAVR 12/2018.  She is doing well.  Mean graident 7 mmHg 12/2018.  # Pre-syncope:  Mild orthostasis in clinic.  Continue current medication.  Repeat carotid Doppler as above.  She did have some asymptomatic tachycardia in the hospital but notes that her BP and hR were stable when she checked them at home.  Continue metoprolol.  Encourage hydration.   # Mild ascending aorta aneurysm: Stable.  3.8 cm 08/2018.  # Hypertension: BP is much better controlled.  Continue amlodipine and benazepril.  #  Non-obstructive coronary calcification:  Continue aspirin and  Rosuvastatin.  <50%  LAD disease on coronary CT-A in 2018 and 2019.  # Mild pulmonary hypertension: Likely 2/2 COPD.  She is euvolemic.   Current medicines are reviewed at length with the patient today.  The patient does not have concerns regarding medicines.  The following changes have been made:  none  Labs/ tests ordered today include:   Orders Placed This Encounter  Procedures  . Carotid     Disposition:   FU with Shonte Beutler C. [name], MD, [facility] in 6 months.   Signed, Kyan Giannone C. [name], MD, [facility]  09/07/2019 3:14 PM    [place]

## 2019-09-11 ENCOUNTER — Other Ambulatory Visit: Payer: Self-pay | Admitting: Cardiovascular Disease

## 2019-09-15 ENCOUNTER — Ambulatory Visit (INDEPENDENT_AMBULATORY_CARE_PROVIDER_SITE_OTHER): Payer: Medicare Other | Admitting: Neurology

## 2019-09-15 ENCOUNTER — Other Ambulatory Visit: Payer: Self-pay

## 2019-09-15 ENCOUNTER — Telehealth (HOSPITAL_COMMUNITY): Payer: Self-pay | Admitting: Internal Medicine

## 2019-09-15 ENCOUNTER — Encounter: Payer: Self-pay | Admitting: Neurology

## 2019-09-15 VITALS — BP 145/68 | HR 71 | Temp 97.1°F | Ht 66.0 in | Wt 166.0 lb

## 2019-09-15 DIAGNOSIS — I6523 Occlusion and stenosis of bilateral carotid arteries: Secondary | ICD-10-CM | POA: Diagnosis not present

## 2019-09-15 DIAGNOSIS — G43009 Migraine without aura, not intractable, without status migrainosus: Secondary | ICD-10-CM

## 2019-09-15 NOTE — Patient Instructions (Addendum)
I had a long discussion with the patient regarding her episodes of headache and vision disturbance possibly atypical migraine episodes which are not frequent enough at the present time to justify prophylactic medications.  She was advised to avoid provoking triggers if possible. I encouraged her to increase participation in stress laxation activities like regular exercise, swimming, meditation and yoga.  She will continue on aspirin for stroke prevention and have conservative follow-up for her moderate right carotid stenosis with 6 monthly carotid ultrasound studies.   She will return for follow-up in the future in 6 months with my nurse practitioner or call earlier if necessary.

## 2019-09-15 NOTE — Progress Notes (Signed)
[facility] [address] [place]. [place] [address] [phone]       OFFICE FOLLOW UP VISIT NOTE  [name] Date of Birth:  [birth year] Medical Record Number:  [id number]  [name] a lot of time [name] Referring MD: Belinda Fisher  Reason for Referral: Headache and blurred vision HPI: Initial consult 05/28/2019:Dominique [name] is a 78 year old Caucasian lady of [place] origin who is seen today for initial office consultation visit for headaches and blurred vision episodes.  History is obtained from her, review of electronic medical records and personally reviewed imaging films in PACS.  She states that she has a longstanding history of intermittent headaches and blurred vision since she was in her 69s.  She had outgrown them but when she was in her late 51s they started recurring at a frequency of once per month or so.  However since the last 3 weeks following gallbladder surgery on 04/28/2019 she has had daily headaches as well as blurred vision episodes which surprisingly seem to have stopped in the last 2 days.  She describes the headache as starting in the left temples with moderate intensity not accompanied by nausea vomiting light or sound sensitivity.  She takes Tylenol which seems to work and the headache goes away.  The blurred vision involving both eyes and last about 5 to 10 minutes.  After she has a cup of coffee with sugar that disappears as well.  She denies any true vertigo, speech difficulties, loss of vision, gait or balance problems or numbness.  She denies any episodes in the past suggestive of strokes or definite TIAs.  She was recently diagnosed to have atrial fibrillation on 04/26/2019 and is currently wearing a zio patch monitor to further documented.  She has a chads 2 vascular score of 4.  She is currently on Toprol-XL she was previously on aspirin and Plavix but after her gallbladder surgery was switched only to aspirin alone.  She is tolerating  well without bruising or bleeding.  She denies symptoms of scalp tenderness, jaw claudication, arthralgias or myalgias.  Blood pressures well controlled today it is 137/76.  She has history of aortic valve replacement transfemoral early following which she had some complications requiring emergent femoral arterial endarterectomy with Dacron patch angioplasty.  She did undergo CT scan of the head on 05/25/2019 which I personally reviewed shows no acute abnormality.  She is not had an MRI.  She is scheduled to undergo carotid ultrasound on 06/01/2019.  She has not had any recent lipid profile or hemoglobin A1c checked. Update 09/14/2019 : She returns for follow-up after last visit 3 months ago.  She is doing well she is not having recurrent TIA or stroke symptoms.  She still having some episodes of headache and blurred vision about once every 2 weeks or so.  These are much improved and not as severe or frequent.  She is unable to and without specific triggers for these episodes.  She underwent MRI scan of the brain on 06/30/2019 which I personally reviewed shows mild age-appropriate changes of small vessel disease but no acute abnormality stroke or tumor.  MRI of the brain shows no significant large vessel stenosis or occlusion.  She saw cardiologist Dr. [name] last week who ordered carotid ultrasound 12 done on 06/01/2019 showed 60 to 79% right ICA and 1-39% left ICA stenosis.  Evaluation of a cardiac monitoring not showing any recent A. fib.  Patient states she is doing well and has had no  new complaints ROS:   14 system review of systems is positive for headache, blurred vision decreased hearing, memory loss and all other systems negative PMH:  Past Medical History:  Diagnosis Date   Atrial fibrillation ([place])    a. dx 03/2019 while in hospital with cholecystitis   COPD (chronic obstructive pulmonary disease) ([place])    Critical lower limb ischemia    a. after TAVR developed ischemic L leg likely due to  flap/closure, s/p emergent repair of left femoral artery with endarterectomy and Dacron patch angioplasty.   Emphysema lung (HCC)    Hyperlipidemia    LDL goal < 70   Hypertension    Hypomagnesemia    Normal coronary arteries 10/2018   NSVT (nonsustained ventricular tachycardia) ([place]) 03/02/2019   Pulmonary HTN ([place]) 06/05/2016   Moderate with PASP 9mmHg by echo 04/2016 likely Group 3 from COPD and possibly Group 2 from pulmonary venous HTN associated with moderate AS - f/u echo 12/2018 post TAVR showed normal RVSP   Pulmonary nodules    seen on pre TAVR CT, needs 12 month CT follow up   S/P TAVR (transcatheter aortic valve replacement) 12/30/2018   Medtronic Evolut Pro-Plus THV (size 26 mm, serial # [id number]) via the TF approach   Sacral fracture (HCC)    Severe aortic stenosis    a. s/p TAVR 12/2018.   Thoracic ascending aortic aneurysm [facility])    needs yearly follow up    Social History:  Social History   Socioeconomic History   Marital status: Widowed    Spouse name: Not on file   Number of children: 2   Years of education: Not on file   Highest education level: Not on file  Occupational History   Occupation: n/a  Social Designer, fashion/clothing strain: Not on file   Food insecurity    Worry: Not on file    Inability: Not on file   Transportation needs    Medical: Not on file    Non-medical: Not on file  Tobacco Use   Smoking status: Former Smoker    Packs/day: 1.00    Years: 40.00    Pack years: 40.00    Types: Cigarettes    Quit date: 03/19/2004    Years since quitting: 15.5   Smokeless tobacco: Never Used  Substance and Sexual Activity   Alcohol use: Yes    Alcohol/week: 3.0 - 4.0 standard drinks    Types: 3 - 4 Glasses of wine per week    Comment: socially   Drug use: No   Sexual activity: Not on file  Lifestyle   Physical activity    Days per week: Not on file    Minutes per session: Not on file   Stress: Not on file    Relationships   Social connections    Talks on phone: Not on file    Gets together: Not on file    Attends religious service: Not on file    Active member of club or organization: Not on file    Attends meetings of clubs or organizations: Not on file    Relationship status: Not on file   Intimate partner violence    Fear of current or ex partner: Not on file    Emotionally abused: Not on file    Physically abused: Not on file    Forced sexual activity: Not on file  Other Topics Concern   Not on file  Social History Narrative   From [place]  1 living son    Medications:   Current Outpatient Medications on File Prior to Visit  Medication Sig Dispense Refill   acetaminophen (TYLENOL) 325 MG tablet Take 2 tablets (650 mg total) by mouth every 6 (six) hours as needed for mild pain (or Fever >/= 101).     albuterol (PROVENTIL HFA;VENTOLIN HFA) 108 (90 Base) MCG/ACT inhaler Inhale 2 puffs into the lungs every 6 (six) hours as needed for wheezing.     amLODipine (NORVASC) 5 MG tablet Take 1 tablet (5 mg total) by mouth daily. 30 tablet 6   amoxicillin (AMOXIL) 500 MG tablet Take 4 tablets (2,000mg ) one hour prior to all dental visits. 28 tablet 0   aspirin EC 81 MG tablet Take 81 mg by mouth daily.     Calcium Carb-Cholecalciferol (CALCIUM 600 + D PO) Take 600 mg by mouth 3 (three) times a week.      Cholecalciferol (VITAMIN D3) 5000 UNITS CAPS Take 5,000 Units by mouth daily.      denosumab (PROLIA) 60 MG/ML SOLN injection Inject 60 mg into the skin every 6 (six) months. Administer in upper arm, thigh, or abdomen     EPINEPHrine 0.3 mg/0.3 mL IJ SOAJ injection Inject 0.3 mg into the muscle as needed for anaphylaxis.   12   loratadine (CLARITIN) 10 MG tablet Take 10 mg by mouth daily as needed for allergies.      magnesium oxide (MAG-OX) 400 MG tablet Take 1 tablet (400 mg total) by mouth daily. PT OVERDUE FOR [place] OV PLEASE CALL FOR APPT 30 tablet 0   metoprolol  succinate (TOPROL XL) 25 MG 24 hr tablet Take 1 tablet (25 mg total) by mouth daily. 90 tablet 1   Multiple Vitamin (MULTIVITAMIN WITH MINERALS) TABS tablet Take 1 tablet by mouth daily.     rosuvastatin (CRESTOR) 40 MG tablet TAKE 1 TABLET BY MOUTH EVERY DAY 90 tablet 3   No current facility-administered medications on file prior to visit.     Allergies:  No Known Allergies  Physical Exam General: Frail elderly Caucasian lady, seated, in no evident distress Head: head normocephalic and atraumatic.   Neck: supple with no carotid or supraclavicular bruits Cardiovascular: regular rate and rhythm, soft ejection murmur over the base. Musculoskeletal: no deformity Skin:  no rash/petichiae Vascular:  Normal pulses all extremities  Neurologic Exam Mental Status: Awake and fully alert. Oriented to place and time. Recent and remote memory intact. Attention span, concentration and fund of knowledge appropriate. Mood and affect appropriate.  Cranial Nerves: Fundoscopic exam not done. Pupils equal, briskly reactive to light. Extraocular movements full without nystagmus. Visual fields full to confrontation. Hearing intact. Facial sensation intact. Face, tongue, palate moves normally and symmetrically.  Motor: Normal bulk and tone. Normal strength in all tested extremity muscles. Sensory.: intact to touch , pinprick , position and vibratory sensation.  Coordination: Rapid alternating movements normal in all extremities. Finger-to-nose and heel-to-shin performed accurately bilaterally. Gait and Station: Arises from chair without difficulty. Stance is normal. Gait demonstrates normal stride length and balance . Able to heel, toe and tandem walk with moderate difficulty.  Reflexes: 1+ and symmetric. Toes downgoing.       ASSESSMENT: 78 year old Caucasian lady with longstanding recurrent episodes of headache and transient blurred vision possibly migrainous in nature.   New diagnosis of paroxysmal  atrial fibrillation but  Low chads 2 Vas score and placed on aspirin alone for now.     PLAN: I had a long discussion with  the patient regarding her episodes of headache and vision disturbance possibly atypical migraine episodes which are not frequent enough at the present time to justify prophylactic medications.  She was advised to avoid provoking triggers if possible. I encouraged her to increase participation in stress laxation activities like regular exercise, swimming, meditation and yoga.  She will continue on aspirin for stroke prevention and have conservative follow-up for her moderate right carotid stenosis with 6 monthly carotid ultrasound studies.   She will return for follow-up in the future in 6 months with my nurse practitioner or call earlier if necessary.  Greater than 50% time during this 25-minute  visit was spent on counseling and coordination of care about her recurrent episodes of headache and blurred vision and answering questions  Antony Contras, MD  [facility] [address] [place], [place] [address]  Phone [phone] Fax [phone] Note: This document was prepared with digital dictation and possible smart phrase technology. Any transcriptional errors that result from this process are unintentional.

## 2019-09-25 ENCOUNTER — Other Ambulatory Visit: Payer: Self-pay | Admitting: Cardiovascular Disease

## 2019-10-01 ENCOUNTER — Ambulatory Visit (HOSPITAL_COMMUNITY): Payer: Medicare Other

## 2019-10-05 ENCOUNTER — Ambulatory Visit (HOSPITAL_COMMUNITY): Payer: Medicare Other

## 2019-10-07 ENCOUNTER — Ambulatory Visit (HOSPITAL_COMMUNITY): Payer: Medicare Other

## 2019-10-09 ENCOUNTER — Ambulatory Visit (HOSPITAL_COMMUNITY): Payer: Medicare Other

## 2019-10-12 ENCOUNTER — Ambulatory Visit (HOSPITAL_COMMUNITY): Payer: Medicare Other

## 2019-10-14 ENCOUNTER — Ambulatory Visit (HOSPITAL_COMMUNITY): Payer: Medicare Other

## 2019-10-16 ENCOUNTER — Ambulatory Visit (HOSPITAL_COMMUNITY): Payer: Medicare Other

## 2019-10-19 ENCOUNTER — Other Ambulatory Visit: Payer: Self-pay | Admitting: Family Medicine

## 2019-10-19 ENCOUNTER — Ambulatory Visit (HOSPITAL_COMMUNITY): Payer: Medicare Other

## 2019-10-19 ENCOUNTER — Other Ambulatory Visit: Payer: Self-pay

## 2019-10-19 DIAGNOSIS — J441 Chronic obstructive pulmonary disease with (acute) exacerbation: Secondary | ICD-10-CM

## 2019-10-20 ENCOUNTER — Other Ambulatory Visit: Payer: Self-pay

## 2019-10-20 ENCOUNTER — Ambulatory Visit (INDEPENDENT_AMBULATORY_CARE_PROVIDER_SITE_OTHER): Payer: Medicare Other | Admitting: Internal Medicine

## 2019-10-20 ENCOUNTER — Encounter: Payer: Self-pay | Admitting: Internal Medicine

## 2019-10-20 VITALS — BP 155/72 | HR 72 | Temp 97.2°F | Resp 16 | Ht 66.0 in | Wt 164.0 lb

## 2019-10-20 DIAGNOSIS — M810 Age-related osteoporosis without current pathological fracture: Secondary | ICD-10-CM | POA: Diagnosis not present

## 2019-10-20 DIAGNOSIS — R55 Syncope and collapse: Secondary | ICD-10-CM

## 2019-10-20 DIAGNOSIS — G43009 Migraine without aura, not intractable, without status migrainosus: Secondary | ICD-10-CM | POA: Insufficient documentation

## 2019-10-20 DIAGNOSIS — I6523 Occlusion and stenosis of bilateral carotid arteries: Secondary | ICD-10-CM | POA: Diagnosis not present

## 2019-10-20 DIAGNOSIS — I10 Essential (primary) hypertension: Secondary | ICD-10-CM | POA: Diagnosis not present

## 2019-10-20 MED ORDER — DENOSUMAB 60 MG/ML ~~LOC~~ SOSY
60.0000 mg | PREFILLED_SYRINGE | Freq: Once | SUBCUTANEOUS | Status: AC
  Administered 2019-10-20: 10:00:00 60 mg via SUBCUTANEOUS

## 2019-10-20 NOTE — Progress Notes (Signed)
Subjective:    Patient ID: Dominique [name], female    DOB: Aug 03, 1941, 78 y.o.   MRN: [id number]  DOS:  10/20/2019 Type of visit - description: Follow-up At the last visit, she had multiple symptoms, today she reports she is doing better. Headaches: Improve Ambulatory BPs: Typically less than 140/70. Presyncope: Since the last visit it happened 1 time but other than that she has been asymptomatic.  BP Readings from Last 3 Encounters:  10/20/19 (!) 155/72  09/15/19 (!) 145/68  09/07/19 (!) 124/56     Review of Systems No fever chills No chest pain no palpitations Some DOE No cough No dysuria, gross hematuria.  Past Medical History:  Diagnosis Date  . Atrial fibrillation ([place])    a. dx 03/2019 while in hospital with cholecystitis  . COPD (chronic obstructive pulmonary disease) ([place])   . Critical lower limb ischemia    a. after TAVR developed ischemic L leg likely due to flap/closure, s/p emergent repair of left femoral artery with endarterectomy and Dacron patch angioplasty.  . Emphysema lung ([place])   . Hyperlipidemia    LDL goal < 70  . Hypertension   . Hypomagnesemia   . Normal coronary arteries 10/2018  . NSVT (nonsustained ventricular tachycardia) ([place]) 03/02/2019  . Pulmonary HTN ([place]) 06/05/2016   Moderate with PASP 68mmHg by echo 04/2016 likely Group 3 from COPD and possibly Group 2 from pulmonary venous HTN associated with moderate AS - f/u echo 12/2018 post TAVR showed normal RVSP  . Pulmonary nodules    seen on pre TAVR CT, needs 12 month CT follow up  . 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 Sacral fracture ([place])   . Severe aortic stenosis    a. s/p TAVR 12/2018.  Marland Kitchen Thoracic ascending aortic aneurysm [facility])    needs yearly follow up    Past Surgical History:  Procedure Laterality Date  . APPENDECTOMY  1951  . CATARACT EXTRACTION    . CESAREAN SECTION      1971  . CHOLECYSTECTOMY N/A 04/28/2019   Procedure: LAPAROSCOPIC CHOLECYSTECTOMY;  Surgeon: Stark Klein, MD;  Location: [facility];  Service: General;  Laterality: N/A;  . COLONOSCOPY  2018  . FEMORAL-POPLITEAL BYPASS GRAFT Left 12/31/2018   Procedure: PATCH ANGIOPLASTY REPAIR LEFT FEMORAL ARTERY USING HEMASHIELD PLATINUM FINESSE PATCH;  Surgeon: Rosetta Posner, MD;  Location: [place];  Service: Vascular;  Laterality: Left;  . IR RADIOLOGY PERIPHERAL GUIDED IV START  05/30/2017  . IR RADIOLOGY PERIPHERAL GUIDED IV START  09/02/2018  . IR RADIOLOGY PERIPHERAL GUIDED IV START  09/02/2018  . IR US GUIDE VASC ACCESS LEFT  09/02/2018  . IR US GUIDE VASC ACCESS RIGHT  05/30/2017  . IR US GUIDE VASC ACCESS RIGHT  09/02/2018  . JOINT REPLACEMENT  2016   sacroplasty  . LAMINECTOMY    . RIGHT/LEFT HEART CATH AND CORONARY ANGIOGRAPHY N/A 11/27/2018   Procedure: RIGHT/LEFT HEART CATH AND CORONARY ANGIOGRAPHY;  Surgeon: Sherren Mocha, MD;  Location: [place] [facility];  Service: Cardiovascular;  Laterality: N/A;  . TEE WITHOUT CARDIOVERSION N/A 12/30/2018   Procedure: TRANSESOPHAGEAL ECHOCARDIOGRAM (TEE);  Surgeon: Sherren Mocha, MD;  Location: [place] [facility];  Service: Open Heart Surgery;  Laterality: N/A;  . TOTAL HIP ARTHROPLASTY Bilateral   . TRANSCATHETER AORTIC VALVE REPLACEMENT, TRANSFEMORAL N/A 12/30/2018   Procedure: TRANSCATHETER AORTIC VALVE REPLACEMENT, TRANSFEMORAL;  Surgeon: [name],  Legrand Como, MD;  Location: [place] [facility];  Service: Open Heart Surgery;  Laterality: N/A;  . TUBAL LIGATION  1975    Social History   Socioeconomic History  . Marital status: Widowed    Spouse name: Not on file  . Number of children: 2  . Years of education: Not on file  . Highest education level: Not on file  Occupational History  . Occupation: n/a  Tobacco Use  . Smoking status: Former Smoker    Packs/day: 1.00    Years: 40.00    Pack years: 40.00    Types: Cigarettes    Quit date: 03/19/2004    Years  since quitting: 15.5  . Smokeless tobacco: Never Used  Substance and Sexual Activity  . Alcohol use: Yes    Alcohol/week: 3.0 - 4.0 standard drinks    Types: 3 - 4 Glasses of wine per week    Comment: socially  . Drug use: No  . Sexual activity: Not on file  Other Topics Concern  . Not on file  Social History Narrative   From [place]   1 living son   Social Determinants of Health   Financial Resource Strain:   . Difficulty of Paying Living Expenses: Not on file  Food Insecurity:   . Worried About Charity fundraiser in the Last Year: Not on file  . Ran Out of Food in the Last Year: Not on file  Transportation Needs:   . Lack of Transportation (Medical): Not on file  . Lack of Transportation (Non-Medical): Not on file  Physical Activity:   . Days of Exercise per Week: Not on file  . Minutes of Exercise per Session: Not on file  Stress:   . Feeling of Stress : Not on file  Social Connections:   . Frequency of Communication with Friends and Family: Not on file  . Frequency of Social Gatherings with Friends and Family: Not on file  . Attends Religious Services: Not on file  . Active Member of Clubs or Organizations: Not on file  . Attends Archivist Meetings: Not on file  . Marital Status: Not on file  Intimate Partner Violence:   . Fear of Current or Ex-Partner: Not on file  . Emotionally Abused: Not on file  . Physically Abused: Not on file  . Sexually Abused: Not on file      Allergies as of 10/20/2019   No Known Allergies     Medication List       Accurate as of October 20, 2019  9:27 AM. If you have any questions, ask your nurse or doctor.        acetaminophen 325 MG tablet Commonly known as: TYLENOL Take 2 tablets (650 mg total) by mouth every 6 (six) hours as needed for mild pain (or Fever >/= 101).   amLODipine 5 MG tablet Commonly known as: NORVASC Take 1 tablet (5 mg total) by mouth daily.   amoxicillin 500 MG tablet Commonly known as:  AMOXIL Take 4 tablets (2,000mg ) one hour prior to all dental visits.   aspirin EC 81 MG tablet Take 81 mg by mouth daily.   CALCIUM 600 + D PO Take 600 mg by mouth 3 (three) times a week.   denosumab 60 MG/ML Soln injection Commonly known as: PROLIA Inject 60 mg into the skin every 6 (six) months. Administer in upper arm, thigh, or abdomen   EPINEPHrine 0.3 mg/0.3 mL Soaj injection Commonly known as: EPI-PEN Inject 0.3  mg into the muscle as needed for anaphylaxis.   loratadine 10 MG tablet Commonly known as: CLARITIN Take 10 mg by mouth daily as needed for allergies.   magnesium oxide 400 MG tablet Commonly known as: MAG-OX TAKE 1 TABLET (400 MG TOTAL) BY MOUTH DAILY. PT OVERDUE FOR [place] OV PLEASE CALL FOR APPT   metoprolol succinate 25 MG 24 hr tablet Commonly known as: Toprol XL Take 1 tablet (25 mg total) by mouth daily.   multivitamin with minerals Tabs tablet Take 1 tablet by mouth daily.   rosuvastatin 40 MG tablet Commonly known as: CRESTOR TAKE 1 TABLET BY MOUTH EVERY DAY   Ventolin HFA 108 (90 Base) MCG/ACT inhaler Generic drug: albuterol TAKE 2 PUFFS BY MOUTH EVERY 6 HOURS AS NEEDED FOR WHEEZE OR SHORTNESS OF BREATH   Vitamin D3 125 MCG (5000 UT) Caps Take 5,000 Units by mouth daily.           Objective:   Physical Exam BP (!) 155/72 (BP Location: Left Arm, Patient Position: Sitting, Cuff Size: Small)   Pulse 72   Temp (!) 97.2 F (36.2 C) (Temporal)   Resp 16   Ht 5\' 6"  (1.676 m)   Wt 164 lb (74.4 kg)   SpO2 96%   BMI 26.47 kg/m    General:   Well developed, NAD, BMI noted. HEENT:  Normocephalic . Face symmetric, atraumatic Lungs:  CTA B Normal respiratory effort, no intercostal retractions, no accessory muscle use. Heart: RRR,  no murmur.  No pretibial edema bilaterally  Skin: Not pale. Not jaundice Neurologic:  alert & oriented X3.  Speech normal, gait appropriate for age and unassisted Psych--  Cognition and judgment appear  intact.  Cooperative with normal attention span and concentration.  Behavior appropriate. No anxious or depressed appearing.       Assessment      Assessment HTN Hyperlipidemia PULM: --H/o Emphysema, quit tobacco 2006 -- CT chest angio  05-2016---> pulm nodule x2,reticular infiltrate ; f/u CT 11/2016 stable nodule, no f/u -- CT chest angio  05-2016--->  enlarged R P.A. and Ao ; hac CT coronary 05/2017, f/u per cards CV: ----DOE: (-) myoview  8-017, CT angio chest 05-2016:  no PE  -----s/p TVAR 12/2018 ---Paroxysmal A. fib DX 6/28/ 2020 in the context of acute cholecystitis, converted to NSR, not anticoagulated as of 05/25/2019 -MSK --DJD --Sacral Fx, sacral insufficiency, s/p sacroplasty (radiology) 07-2015 --H/o osteoporosis :  took fosamax while in [place], Fosamax rx again by ortho after sacral Fx 07-2015, never took it, first Prolia 11-14-2015 --T score 08-2015 >> normal (likely normal d/t DJD?) H/o vit D def: Normal level 09-2015   PLAN  HTN: Continue amlodipine, metoprolol.  Ambulatory BPs never more than 140/70.  Close to goal.  No change. Presyncope: Saw cardiology 09/07/2019, she was noted to be mildly orthostatic, was encouraged hydration, no medication change.    Symptoms essentially resolved, see HPI Carotid artery disease: 60 to 79% right ICA stenosis (06/01/2019) increased from previous tests, cardiology recommend to recheck 11-2019 Headaches: Saw neurology 09/15/2019, headaches were associated with visual disturbances, diagnosis was migraines.  Recommended avoidance of triggers and relaxation techniques.  She is doing much better Osteoporosis: Prolia shot today, next in 6 months.  Recommend DEXA, patient declined it for now, will revisit next time. RTC 4 months   This visit occurred during the SARS-CoV-2 public health emergency.  Safety protocols were in place, including screening questions prior to the visit, additional usage of staff PPE,  and extensive cleaning of exam  room while observing appropriate contact time as indicated for disinfecting solutions.

## 2019-10-20 NOTE — Patient Instructions (Signed)
  GO TO THE FRONT DESK Schedule your next appointment   for checkup in 4 months  Continue checking your blood pressures  BP GOAL is between 110/65 and  140/85. If it is consistently higher or lower, let me know

## 2019-10-20 NOTE — Progress Notes (Signed)
Pre visit review using our clinic review tool, if applicable. No additional management support is needed unless otherwise documented below in the visit note. 

## 2019-10-20 NOTE — Assessment & Plan Note (Signed)
HTN: Continue amlodipine, metoprolol.  Ambulatory BPs never more than 140/70.  Close to goal.  No change. Presyncope: Saw cardiology 09/07/2019, she was noted to be mildly orthostatic, was encouraged hydration, no medication change.    Symptoms essentially resolved, see HPI Carotid artery disease: 60 to 79% right ICA stenosis (06/01/2019) increased from previous tests, cardiology recommend to recheck 11-2019 Headaches: Saw neurology 09/15/2019, headaches were associated with visual disturbances, diagnosis was migraines.  Recommended avoidance of triggers and relaxation techniques.  She is doing much better Osteoporosis: Prolia shot today, next in 6 months.  Recommend DEXA, patient declined it for now, will revisit next time. RTC 4 months

## 2019-10-21 ENCOUNTER — Ambulatory Visit (HOSPITAL_COMMUNITY): Payer: Medicare Other

## 2019-10-26 ENCOUNTER — Ambulatory Visit (HOSPITAL_COMMUNITY): Payer: Medicare Other

## 2019-10-28 ENCOUNTER — Ambulatory Visit (HOSPITAL_COMMUNITY): Payer: Medicare Other

## 2019-11-02 ENCOUNTER — Ambulatory Visit (HOSPITAL_COMMUNITY): Payer: Medicare Other

## 2019-11-04 ENCOUNTER — Ambulatory Visit (HOSPITAL_COMMUNITY): Payer: Medicare Other

## 2019-11-06 ENCOUNTER — Ambulatory Visit (HOSPITAL_COMMUNITY): Payer: Medicare Other

## 2019-11-09 ENCOUNTER — Ambulatory Visit (HOSPITAL_COMMUNITY): Payer: Medicare Other

## 2019-11-11 ENCOUNTER — Ambulatory Visit (HOSPITAL_COMMUNITY): Payer: Medicare Other

## 2019-11-13 ENCOUNTER — Ambulatory Visit (HOSPITAL_COMMUNITY): Payer: Medicare Other

## 2019-11-16 ENCOUNTER — Ambulatory Visit (HOSPITAL_COMMUNITY): Payer: Medicare Other

## 2019-11-18 ENCOUNTER — Ambulatory Visit (HOSPITAL_COMMUNITY): Payer: Medicare Other

## 2019-11-20 ENCOUNTER — Ambulatory Visit (HOSPITAL_COMMUNITY): Payer: Medicare Other

## 2019-11-23 ENCOUNTER — Ambulatory Visit (HOSPITAL_COMMUNITY): Payer: Medicare Other

## 2019-11-24 DIAGNOSIS — R350 Frequency of micturition: Secondary | ICD-10-CM | POA: Diagnosis not present

## 2019-11-24 DIAGNOSIS — N390 Urinary tract infection, site not specified: Secondary | ICD-10-CM | POA: Diagnosis not present

## 2019-11-24 DIAGNOSIS — N760 Acute vaginitis: Secondary | ICD-10-CM | POA: Diagnosis not present

## 2019-11-25 ENCOUNTER — Ambulatory Visit (HOSPITAL_COMMUNITY): Payer: Medicare Other

## 2019-11-26 ENCOUNTER — Ambulatory Visit: Payer: Medicare Other

## 2019-11-27 ENCOUNTER — Ambulatory Visit (HOSPITAL_COMMUNITY): Payer: Medicare Other

## 2019-11-29 ENCOUNTER — Other Ambulatory Visit: Payer: Self-pay | Admitting: Gastroenterology

## 2019-11-29 ENCOUNTER — Other Ambulatory Visit: Payer: Self-pay | Admitting: Cardiovascular Disease

## 2019-12-02 ENCOUNTER — Telehealth: Payer: Self-pay | Admitting: Internal Medicine

## 2019-12-02 NOTE — Telephone Encounter (Signed)
Patient would like to know if she should still take this medication listed below please advise  metoprolol succinate (TOPROL-XL) 25 MG 24 hr tablet [id number]    She would like a call back

## 2019-12-02 NOTE — Telephone Encounter (Signed)
Spoke w/ Pt- informed med is still on list- recommend that if she has questions regarding the Toprol-XL she should contact Dr. Blenda Mounts office. Pt wanted to know if Dr. Larose Kells could refill it- I informed her it actually looks like Dr. [name] refilled it on 11/30/19 to [facility]. Pt verbalized understanding.

## 2019-12-02 NOTE — Telephone Encounter (Signed)
BP Readings from Last 3 Encounters:  10/20/19 (!) 155/72  09/15/19 (!) 145/68  09/07/19 (!) 124/56

## 2019-12-05 ENCOUNTER — Ambulatory Visit: Payer: Medicare Other

## 2019-12-09 DIAGNOSIS — R350 Frequency of micturition: Secondary | ICD-10-CM | POA: Diagnosis not present

## 2019-12-09 DIAGNOSIS — N762 Acute vulvitis: Secondary | ICD-10-CM | POA: Diagnosis not present

## 2019-12-09 DIAGNOSIS — R309 Painful micturition, unspecified: Secondary | ICD-10-CM | POA: Diagnosis not present

## 2019-12-31 ENCOUNTER — Other Ambulatory Visit (HOSPITAL_COMMUNITY): Payer: Medicare Other

## 2019-12-31 ENCOUNTER — Ambulatory Visit: Payer: Medicare Other | Admitting: Physician Assistant

## 2019-12-31 ENCOUNTER — Inpatient Hospital Stay: Admission: RE | Admit: 2019-12-31 | Payer: Medicare Other | Source: Ambulatory Visit

## 2020-01-13 NOTE — Progress Notes (Signed)
[facility]                                       Cardiology Office Note    Date:  01/14/2020   ID:  Dominique [name], DOB [birth year], MRN [id number]  PCP:  Colon Branch, MD  Cardiologist: Dr. [name] / Dr. [name] & Dr. Cyndia Bent (TAVR)  CC: 1 year s/p TAVR  History of Present Illness:  Dominique [name] is a 79 y.o. female with a history of HTN, HLD, COPD, carotid artery stenosis, and severe ASs/p TAVR (12/30/18) c/b ischemic leg requiring surgical interventionwho presents to clinic for 1 year follow up.   She underwent successfulTAVR with a74mm Medtronic CoreValve Evolut Provia the TF approach on3/12/2018. Post operative echoshowed EF 65%, normally functioning TAVR with mean gradient of 6 mm Hg and no PVL.Post op course was complicated by an ischemic left leg. Dr. [name] took her to the OR foremergency repair of left femoral artery with endarterectomy and Dacron patch angioplasty. She was discharged on POD2 on Aspirin and plavix. 1 month echo showed normal EF with normal valve function (mean gradient 13.1 mm Hg and no PVL). She had a cardiac catheterization prior to TAVR that showed normal coronary arteries.   She has continued to have some shortness of breath since TAVR. She has also struggled with pre syncope and palpitations.   Today she presents to clinic for follow up. No CP or SOB. No LE edema, orthopnea or PND. No dizziness or syncope recently. She did have a couple episodes of syncope before christmas last year but none recently. No blood in stool or urine. No palpitations.    Past Medical History:  Diagnosis Date  . Atrial fibrillation ([place])    a. dx 03/2019 while in hospital with cholecystitis  . COPD (chronic obstructive pulmonary disease) ([place])   . Critical lower limb ischemia    a. after TAVR developed ischemic L leg likely due to flap/closure, s/p emergent repair of left femoral artery with endarterectomy and Dacron  patch angioplasty.  . Emphysema lung ([place])   . Hyperlipidemia    LDL goal < 70  . Hypertension   . Hypomagnesemia   . Normal coronary arteries 10/2018  . NSVT (nonsustained ventricular tachycardia) ([place]) 03/02/2019  . Pulmonary HTN ([place]) 06/05/2016   Moderate with PASP 69mmHg by echo 04/2016 likely Group 3 from COPD and possibly Group 2 from pulmonary venous HTN associated with moderate AS - f/u echo 12/2018 post TAVR showed normal RVSP  . Pulmonary nodules    seen on pre TAVR CT, needs 12 month CT follow up  . S/P TAVR (transcatheter aortic valve replacement) 12/30/2018   Medtronic Evolut Pro-Plus THV (size 26 mm, serial # [id number]) via the TF approach  . Sacral fracture ([place])   . Severe aortic stenosis    a. s/p TAVR 12/2018.  Marland Kitchen Thoracic ascending aortic aneurysm [facility])    needs yearly follow up    Past Surgical History:  Procedure Laterality Date  . APPENDECTOMY  1951  . CATARACT EXTRACTION    . CESAREAN SECTION     1971  . CHOLECYSTECTOMY N/A 04/28/2019   Procedure: LAPAROSCOPIC CHOLECYSTECTOMY;  Surgeon: Stark Klein, MD;  Location: [facility];  Service: General;  Laterality: N/A;  . COLONOSCOPY  2018  . FEMORAL-POPLITEAL BYPASS GRAFT Left 12/31/2018   Procedure: PATCH ANGIOPLASTY  REPAIR LEFT FEMORAL ARTERY USING HEMASHIELD PLATINUM FINESSE PATCH;  Surgeon: Rosetta Posner, MD;  Location: [facility];  Service: Vascular;  Laterality: Left;  . IR RADIOLOGY PERIPHERAL GUIDED IV START  05/30/2017  . IR RADIOLOGY PERIPHERAL GUIDED IV START  09/02/2018  . IR RADIOLOGY PERIPHERAL GUIDED IV START  09/02/2018  . IR US GUIDE VASC ACCESS LEFT  09/02/2018  . IR US GUIDE VASC ACCESS RIGHT  05/30/2017  . IR US GUIDE VASC ACCESS RIGHT  09/02/2018  . JOINT REPLACEMENT  2016   sacroplasty  . LAMINECTOMY    . RIGHT/LEFT HEART CATH AND CORONARY ANGIOGRAPHY N/A 11/27/2018   Procedure: RIGHT/LEFT HEART CATH AND CORONARY ANGIOGRAPHY;  Surgeon: Sherren Mocha, MD;  Location: [facility];  Service: Cardiovascular;   Laterality: N/A;  . TEE WITHOUT CARDIOVERSION N/A 12/30/2018   Procedure: TRANSESOPHAGEAL ECHOCARDIOGRAM (TEE);  Surgeon: Sherren Mocha, MD;  Location: [place] CV LAB;  Service: Open Heart Surgery;  Laterality: N/A;  . TOTAL HIP ARTHROPLASTY Bilateral   . TRANSCATHETER AORTIC VALVE REPLACEMENT, TRANSFEMORAL N/A 12/30/2018   Procedure: TRANSCATHETER AORTIC VALVE REPLACEMENT, TRANSFEMORAL;  Surgeon: Sherren Mocha, MD;  Location: [facility] CV LAB;  Service: Open Heart Surgery;  Laterality: N/A;  . TUBAL LIGATION  1975    Current Medications: Outpatient Medications Prior to Visit  Medication Sig Dispense Refill  . acetaminophen (TYLENOL) 325 MG tablet Take 2 tablets (650 mg total) by mouth every 6 (six) hours as needed for mild pain (or Fever >/= 101).    Marland Kitchen amLODipine (NORVASC) 5 MG tablet Take 1 tablet (5 mg total) by mouth daily. 30 tablet 6  . amoxicillin (AMOXIL) 500 MG tablet Take 4 tablets (2,000mg ) one hour prior to all dental visits. 28 tablet 0  . aspirin EC 81 MG tablet Take 81 mg by mouth daily.    . Calcium Carb-Cholecalciferol (CALCIUM 600 + D PO) Take 600 mg by mouth 3 (three) times a week.     . Cholecalciferol (VITAMIN D3) 5000 UNITS CAPS Take 5,000 Units by mouth daily.     Marland Kitchen denosumab (PROLIA) 60 MG/ML SOLN injection Inject 60 mg into the skin every 6 (six) months. Administer in upper arm, thigh, or abdomen    . EPINEPHrine 0.3 mg/0.3 mL IJ SOAJ injection Inject 0.3 mg into the muscle as needed for anaphylaxis.   12  . loratadine (CLARITIN) 10 MG tablet Take 10 mg by mouth daily as needed for allergies.     . magnesium oxide (MAG-OX) 400 MG tablet TAKE 1 TABLET (400 MG TOTAL) BY MOUTH DAILY. PT OVERDUE FOR [place] OV PLEASE CALL FOR APPT 30 tablet 11  . metoprolol succinate (TOPROL-XL) 25 MG 24 hr tablet TAKE 1 TABLET BY MOUTH EVERY DAY 90 tablet 2  . Multiple Vitamin (MULTIVITAMIN WITH MINERALS) TABS tablet Take 1 tablet by mouth daily.    . rosuvastatin (CRESTOR) 40 MG  tablet TAKE 1 TABLET BY MOUTH EVERY DAY 90 tablet 3  . VENTOLIN HFA 108 (90 Base) MCG/ACT inhaler TAKE 2 PUFFS BY MOUTH EVERY 6 HOURS AS NEEDED FOR WHEEZE OR SHORTNESS OF BREATH 18 g 1   No facility-administered medications prior to visit.     Allergies:   Patient has no known allergies.   Social History   Socioeconomic History  . Marital status: Widowed    Spouse name: Not on file  . Number of children: 2  . Years of education: Not on file  . Highest education level: Not on file  Occupational History  . Occupation: n/a  Tobacco Use  . Smoking status: Former Smoker    Packs/day: 1.00    Years: 40.00    Pack years: 40.00    Types: Cigarettes    Quit date: 03/19/2004    Years since quitting: 15.8  . Smokeless tobacco: Never Used  Substance and Sexual Activity  . Alcohol use: Yes    Alcohol/week: 3.0 - 4.0 standard drinks    Types: 3 - 4 Glasses of wine per week    Comment: socially  . Drug use: No  . Sexual activity: Not on file  Other Topics Concern  . Not on file  Social History Narrative   From [place]   1 living son   Social Determinants of Health   Financial Resource Strain:   . Difficulty of Paying Living Expenses:   Food Insecurity:   . Worried About Charity fundraiser in the Last Year:   . Arboriculturist in the Last Year:   Transportation Needs:   . Film/video editor (Medical):   Marland Kitchen Lack of Transportation (Non-Medical):   Physical Activity:   . Days of Exercise per Week:   . Minutes of Exercise per Session:   Stress:   . Feeling of Stress :   Social Connections:   . Frequency of Communication with Friends and Family:   . Frequency of Social Gatherings with Friends and Family:   . Attends Religious Services:   . Active Member of Clubs or Organizations:   . Attends Archivist Meetings:   Marland Kitchen Marital Status:      Family History:  The patient's family history includes Breast cancer in her mother; Cancer in her brother; Diabetes in her  son.     ROS:   Please see the history of present illness.    ROS All other systems reviewed and are negative.   PHYSICAL EXAM:   VS:  BP 140/72   Pulse 73   Ht 5\' 6"  (1.676 m)   Wt 161 lb 12.8 oz (73.4 kg)   SpO2 95%   BMI 26.12 kg/m    GEN: Well nourished, well developed, in no acute distress HEENT: normal Neck: no JVD or masses Cardiac: RRR; soft flow murmur. No rubs, or gallops,no edema  Respiratory:  clear to auscultation bilaterally, normal work of breathing GI: soft, nontender, nondistended, + BS MS: no deformity or atrophy Skin: warm and dry, no rash Neuro:  Alert and Oriented x 3, Strength and sensation are intact Psych: euthymic mood, full affect   Wt Readings from Last 3 Encounters:  01/14/20 161 lb 12.8 oz (73.4 kg)  10/20/19 164 lb (74.4 kg)  09/15/19 166 lb (75.3 kg)      Studies/Labs Reviewed:   EKG:  EKG is NOT ordered today.  Recent Labs: 04/28/2019: Magnesium 1.8; TSH 1.185 06/08/2019: ALT 35; Hemoglobin 13.0; Platelets 418 06/15/2019: BUN 15; Creatinine, Ser 0.73; Potassium 4.3; Sodium 140   Lipid Panel    Component Value Date/Time   CHOL 139 05/28/2019 1740   TRIG 129 05/28/2019 1740   HDL 54 05/28/2019 1740   CHOLHDL 2.6 05/28/2019 1740   CHOLHDL 2 11/13/2017 1127   VLDL 13.2 11/13/2017 1127   LDLCALC 59 05/28/2019 1740    Additional studies/ records that were reviewed today include:  TAVR OPERATIVE NOTE  Date of Procedure:12/30/2018  Preoperative Diagnosis:Severe Aortic Stenosis   Postoperative Diagnosis:Same   Procedure:   Transcatheter Aortic Valve Replacement - PercutaneousRightTransfemoral Approach Medtronic  CoreValve Evolut Pro +(size 97mm, serial # [id number])  Co-Surgeons:Bryan Alveria Apley, MD and Sherren Mocha, MD   Anesthesiologist:Ryan Roanna Banning, MD  Dala Dock, MD  Pre-operative Echo  Findings: ? Severe aortic stenosis ? Normal left ventricular systolic function  Post-operative Echo Findings: ? Noparavalvular leak ? Normal left ventricular systolic function  _____________  Echo 12/31/18 IMPRESSIONS 1. The left ventricle has hyperdynamic systolic function, with an ejection fraction of >65%. The cavity size was normal. There is moderately increased left ventricular wall thickness. Left ventricular diastolic Doppler parameters are consistent with  impaired relaxation Indeterminent filling pressures No evidence of left ventricular regional wall motion abnormalities. 2. The right ventricle has normal systolic function. The cavity was normal. There is no increase in right ventricular wall thickness. 3. Left atrial size was mildly dilated. 4. The mitral valve is degenerative. Mild calcification of the mitral valve leaflet. 5. The aortic root and ascending aorta are normal in size and structure. 6. The inferior vena cava was dilated in size with <50% respiratory variability. 7. No evidence of left ventricular regional wall motion abnormalities. 8. When compared to the prior study: 10/30/2018 - 60-65%, severe AS.  Aortic Valve: The aortic valve has been repaired/replaced Aortic valve regurgitation was not visualized by color flow Doppler. There is no evidence of aortic valve stenosis.  _____________   Echo 01/26/19 IMPRESSIONS 1. The left ventricle has normal systolic function with an ejection fraction of 60-65%. The cavity size was normal. Left ventricular diastolic parameters were normal. 2. The right ventricle has normal systolic function. The cavity was normal. There is no increase in right ventricular wall thickness. 3. No evidence present in the left atrial appendage. 4. Mild thickening of the mitral valve leaflet. 5. Post TAVR with 26 mm Medtronic Core Valve Mean gradient low and stable since echo done 12/31/18 and no significant PVL. 6. No pulmonic  valve vegetation visualized.  FINDINGS Left Ventricle: The left ventricle has normal systolic function, with an ejection fraction of 60-65%. The cavity size was normal. There is no increase in left ventricular wall thickness. Left ventricular diastolic parameters were normal. Right Ventricle: The right ventricle has normal systolic function. The cavity was normal. There is no increase in right ventricular wall thickness. Left Atrium: left atrial size was normal in size Left Atrial Appendage: No evidence of a thrombus present in the left atrial appendage. Right Atrium: right atrial size was normal in size. Right atrial pressure is estimated at 3 mmHg. Interatrial Septum: No atrial level shunt detected by color flow Doppler. Pericardium: There is no evidence of pericardial effusion. Mitral Valve: The mitral valve is normal in structure. Mild thickening of the mitral valve leaflet. Mitral valve regurgitation is not visualized by color flow Doppler. Tricuspid Valve: The tricuspid valve is normal in structure. Tricuspid valve regurgitation is trivial by color flow Doppler. Aortic Valve: The aortic valve has been repaired/replaced Aortic valve regurgitation was not assessed by color flow Doppler. There is no evidence of aortic valve stenosis. Post TAVR with 26 mm Medtronic Core Valve Mean gradient low and stable since echo  done 12/31/18 and no significant PVL. Pulmonic Valve: The pulmonic valve was grossly normal. Pulmonic valve regurgitation is trivial by color flow Doppler. No pulmonic valve vegetation visualized. Venous: The inferior vena cava is normal in size with greater than 50% respiratory variability.   ______________  Echo 01/14/20 IMPRESSIONS  1. Left ventricular ejection fraction, by estimation, is 65 to 70%. The left ventricle has normal function. The left ventricle  has no regional wall motion abnormalities. There is mild asymmetric left ventricular hypertrophy of the basal-septal  segment.  Left ventricular diastolic parameters are consistent with Grade I diastolic dysfunction (impaired relaxation). Elevated left ventricular end-diastolic pressure.  2. Right ventricular systolic function is normal. The right ventricular size is normal. There is mildly elevated pulmonary artery systolic pressure.  3. The mitral valve is normal in structure. Trivial mitral valve regurgitation. No evidence of mitral stenosis.  4. TAVR bioprothesis well-seated and functioning properly. The aortic valve has been repaired/replaced. Aortic valve regurgitation is not visualized. No aortic stenosis is present. There is a 26 mm Medtronic CoreValve Evolut Pro valve present in the  aortic position. Procedure Date: 12/2018. Aortic valve area, by VTI measures 1.38 cm. Aortic valve mean gradient measures 8.0 mmHg. Aortic valve Vmax measures 1.88 m/s.  5. The inferior vena cava is normal in size with greater than 50% respiratory variability, suggesting right atrial pressure of 3 mmHg.   ASSESSMENT & PLAN:     Severe AS s/p TAVR: echo today shows EF 65%, normally functioning TAVR with a mean gradient of 8 mm hg and no PVL. She has NYHA class I symptoms. Continue on Aspirin 81mg  daily indefinitely. She has amoxicillin for SBE prophylaxis.   HTN: BP borderline today. No changes made.   Ectasia of the ascending thoracic aorta: CT today shows stable 4.0 cm ascending aortic aneurysm. Radiology recommends annual imaging followup by CTA or MRA.   Pulmonary nodules: multiple small pulmonary nodules measuring 4 mm or less in size scattered throughout the lungs bilaterally. These are nonspecific, but statistically likely benign. She has a long smoking history. Follow up CT scan today showed stable scattered RUL nodules largest warranting an annual CT. Will defer to PCP. There is another subsolid posterior left upper lobe 2.4 cm pulmonary nodule with associated distortion of the left major fissure, not appreciably  changed since 06/02/2019 CT. Finding is indeterminate, with differential including primary bronchogenic adenocarcinoma or postinflammatory scarring. PET-CT could be considered for further characterization. Patient is very concerned about this, so I will get PET CT set up.   Carotid artery stenosis: she has known 60 to 79% right ICA stenosis and 1 to 39% stenosis on the left. Continue medical therapy.    Medication Adjustments/Labs and Tests Ordered: Current medicines are reviewed at length with the patient today.  Concerns regarding medicines are outlined above.  Medication changes, Labs and Tests ordered today are listed in the Patient Instructions below. Patient Instructions  Medication Instructions:  Your physician recommends that you continue on your current medications as directed. Please refer to the Current Medication list given to you today.  *If you need a refill on your cardiac medications before your next appointment, please call your pharmacy*   Lab Work: TODAY - BMET If you have labs (blood work) drawn today and your tests are completely normal, you will receive your results only by: Marland Kitchen MyChart Message (if you have MyChart) OR . A paper copy in the mail If you have any lab test that is abnormal or we need to change your treatment, we will call you to review the results.   Testing/Procedures: A PET Ct has been ordered to further evaluate your lung nodule. You should get a call from radiology to get this scheduled.    Follow-Up: At [facility], you and your health needs are our priority.  As part of our continuing mission to provide you with exceptional heart care, we have created designated  Provider Care Teams.  These Care Teams include your primary Cardiologist (physician) and Advanced Practice Providers (APPs -  Physician Assistants and Nurse Practitioners) who all work together to provide you with the care you need, when you need it.  We recommend signing up for the  patient portal called "MyChart".  Sign up information is provided on this After Visit Summary.  MyChart is used to connect with patients for Virtual Visits (Telemedicine).  Patients are able to view lab/test results, encounter notes, upcoming appointments, etc.  Non-urgent messages can be sent to your provider as well.   To learn more about what you can do with MyChart, go to [email or web address].    Your next appointment:   2 month(s)  The format for your next appointment:   In Person  Provider:   You may see [name], MD or one of the following Advanced Practice Providers on your designated Care Team:    Kerin Ransom, PA-C  [place], [place]  Coletta Memos, FNP        Signed, Angelena Form, [place]  01/14/2020 8:41 PM    [address], [place], [place]  [address] Phone: [phone]; Fax: [phone]

## 2020-01-14 ENCOUNTER — Ambulatory Visit (HOSPITAL_COMMUNITY): Payer: Medicare Other | Attending: Cardiovascular Disease

## 2020-01-14 ENCOUNTER — Ambulatory Visit (INDEPENDENT_AMBULATORY_CARE_PROVIDER_SITE_OTHER)
Admission: RE | Admit: 2020-01-14 | Discharge: 2020-01-14 | Disposition: A | Discharge status: Home / Self Care | Payer: Medicare Other | Source: Ambulatory Visit | Attending: Physician Assistant | Admitting: Physician Assistant

## 2020-01-14 ENCOUNTER — Ambulatory Visit (INDEPENDENT_AMBULATORY_CARE_PROVIDER_SITE_OTHER): Payer: Medicare Other | Admitting: Physician Assistant

## 2020-01-14 ENCOUNTER — Encounter: Payer: Self-pay | Admitting: Physician Assistant

## 2020-01-14 ENCOUNTER — Other Ambulatory Visit: Payer: Self-pay

## 2020-01-14 VITALS — BP 140/72 | HR 73 | Ht 66.0 in | Wt 161.8 lb

## 2020-01-14 DIAGNOSIS — I779 Disorder of arteries and arterioles, unspecified: Secondary | ICD-10-CM | POA: Diagnosis not present

## 2020-01-14 DIAGNOSIS — R918 Other nonspecific abnormal finding of lung field: Secondary | ICD-10-CM | POA: Diagnosis not present

## 2020-01-14 DIAGNOSIS — I77810 Thoracic aortic ectasia: Secondary | ICD-10-CM | POA: Diagnosis not present

## 2020-01-14 DIAGNOSIS — I712 Thoracic aortic aneurysm, without rupture, unspecified: Secondary | ICD-10-CM

## 2020-01-14 DIAGNOSIS — I10 Essential (primary) hypertension: Secondary | ICD-10-CM

## 2020-01-14 DIAGNOSIS — R911 Solitary pulmonary nodule: Secondary | ICD-10-CM

## 2020-01-14 DIAGNOSIS — Z952 Presence of prosthetic heart valve: Secondary | ICD-10-CM

## 2020-01-14 DIAGNOSIS — I7121 Aneurysm of the ascending aorta, without rupture: Secondary | ICD-10-CM

## 2020-01-14 NOTE — Patient Instructions (Signed)
Medication Instructions:  Your physician recommends that you continue on your current medications as directed. Please refer to the Current Medication list given to you today.  *If you need a refill on your cardiac medications before your next appointment, please call your pharmacy*   Lab Work: TODAY - BMET If you have labs (blood work) drawn today and your tests are completely normal, you will receive your results only by: Marland Kitchen MyChart Message (if you have MyChart) OR . A paper copy in the mail If you have any lab test that is abnormal or we need to change your treatment, we will call you to review the results.   Testing/Procedures: A PET Ct has been ordered to further evaluate your lung nodule. You should get a call from radiology to get this scheduled.    Follow-Up: At [facility], you and your health needs are our priority.  As part of our continuing mission to provide you with exceptional heart care, we have created designated Provider Care Teams.  These Care Teams include your primary Cardiologist (physician) and Advanced Practice Providers (APPs -  Physician Assistants and Nurse Practitioners) who all work together to provide you with the care you need, when you need it.  We recommend signing up for the patient portal called "MyChart".  Sign up information is provided on this After Visit Summary.  MyChart is used to connect with patients for Virtual Visits (Telemedicine).  Patients are able to view lab/test results, encounter notes, upcoming appointments, etc.  Non-urgent messages can be sent to your provider as well.   To learn more about what you can do with MyChart, go to [email or web address].    Your next appointment:   2 month(s)  The format for your next appointment:   In Person  Provider:   You may see [name], MD or one of the following Advanced Practice Providers on your designated Care Team:    Kerin Ransom, PA-C  [place], [place]  Coletta Memos,  [place]

## 2020-01-15 LAB — BASIC METABOLIC PANEL
BUN/Creatinine Ratio: 31 — ABNORMAL HIGH (ref 12–28)
BUN: 22 mg/dL (ref 8–27)
CO2: 26 mmol/L (ref 20–29)
Calcium: 10.4 mg/dL — ABNORMAL HIGH (ref 8.7–10.3)
Chloride: 104 mmol/L (ref 96–106)
Creatinine, Ser: 0.7 mg/dL (ref 0.57–1.00)
GFR calc Af Amer: 95 mL/min/{1.73_m2} (ref >59)
GFR calc non Af Amer: 83 mL/min/{1.73_m2} (ref >59)
Glucose: 107 mg/dL — ABNORMAL HIGH (ref 65–99)
Potassium: 4.7 mmol/L (ref 3.5–5.2)
Sodium: 144 mmol/L (ref 134–144)

## 2020-01-27 ENCOUNTER — Other Ambulatory Visit: Payer: Self-pay

## 2020-01-27 ENCOUNTER — Ambulatory Visit (HOSPITAL_COMMUNITY)
Admission: RE | Admit: 2020-01-27 | Discharge: 2020-01-27 | Disposition: A | Discharge status: Home / Self Care | Payer: Medicare Other | Source: Ambulatory Visit | Attending: Physician Assistant | Admitting: Physician Assistant

## 2020-01-27 DIAGNOSIS — R911 Solitary pulmonary nodule: Secondary | ICD-10-CM

## 2020-01-27 DIAGNOSIS — Z79899 Other long term (current) drug therapy: Secondary | ICD-10-CM | POA: Diagnosis not present

## 2020-01-27 DIAGNOSIS — I700 Atherosclerosis of aorta: Secondary | ICD-10-CM | POA: Insufficient documentation

## 2020-01-27 DIAGNOSIS — J439 Emphysema, unspecified: Secondary | ICD-10-CM | POA: Insufficient documentation

## 2020-01-27 DIAGNOSIS — R918 Other nonspecific abnormal finding of lung field: Secondary | ICD-10-CM | POA: Insufficient documentation

## 2020-01-27 LAB — GLUCOSE, CAPILLARY: Glucose-Capillary: 92 mg/dL (ref 70–99)

## 2020-01-27 MED ORDER — FLUDEOXYGLUCOSE F - 18 (FDG) INJECTION
7.4000 | Freq: Once | INTRAVENOUS | Status: AC | PRN
  Administered 2020-01-27: 7.4 via INTRAVENOUS

## 2020-01-28 ENCOUNTER — Other Ambulatory Visit: Payer: Self-pay | Admitting: *Deleted

## 2020-01-28 DIAGNOSIS — R918 Other nonspecific abnormal finding of lung field: Secondary | ICD-10-CM

## 2020-01-28 DIAGNOSIS — R911 Solitary pulmonary nodule: Secondary | ICD-10-CM

## 2020-01-28 NOTE — Progress Notes (Signed)
Message from K. Grandville Silos that patient needs referral to pulmonary for pulmonary nodule follow up.  Referral placed.

## 2020-02-04 ENCOUNTER — Telehealth: Payer: Self-pay | Admitting: *Deleted

## 2020-02-04 NOTE — Telephone Encounter (Signed)
-----   Message from Magdalen Spatz, NP sent at 02/04/2020 11:37 AM EDT ----- Regarding: FW: pulmonary nodules Can you all please get this patient scheduled with Dr. Valeta Harms for consult. Thanks ----- Message ----- From: Garner Nash, DO Sent: 02/01/2020   5:48 PM EDT To: Magdalen Spatz, NP Subject: RE: pulmonary nodules                          I am happy to see her.  I should have slots toward the end of April to establish care Brad  ----- Message ----- From: Magdalen Spatz, NP Sent: 01/28/2020   9:41 AM EDT To: Collene Gobble, MD, Eileen Stanford, PA-C, # Subject: RE: pulmonary nodules                          Curt Bears, We are happy to follow this patient for her nodule in our clinic.  She needs a consult with one of the docs. Please send an order for Ambulatory referral to Pulmonary,  Dr. Lamonte Sakai, Icard, and Tamala Julian all manage nodules .They will make sure she has  a 6 month follow up CT Chest, and that she is seen by one of these physicians.  Thanks so much for the referral.  ----- Message ----- From: Eileen Stanford, PA-C Sent: 01/27/2020   6:07 PM EDT To: Magdalen Spatz, NP Subject: pulmonary nodules                              Can you take a look at her CT and PET scan.. is this someone you can take over? She is aware she will need continued follow up and that your office may call her. Thanks KT

## 2020-02-17 ENCOUNTER — Other Ambulatory Visit: Payer: Self-pay

## 2020-02-18 ENCOUNTER — Ambulatory Visit (INDEPENDENT_AMBULATORY_CARE_PROVIDER_SITE_OTHER): Payer: Medicare Other | Admitting: Internal Medicine

## 2020-02-18 ENCOUNTER — Encounter: Payer: Self-pay | Admitting: Internal Medicine

## 2020-02-18 ENCOUNTER — Other Ambulatory Visit: Payer: Self-pay

## 2020-02-18 ENCOUNTER — Ambulatory Visit (INDEPENDENT_AMBULATORY_CARE_PROVIDER_SITE_OTHER): Payer: Medicare Other | Admitting: Pulmonary Disease

## 2020-02-18 ENCOUNTER — Encounter: Payer: Self-pay | Admitting: Pulmonary Disease

## 2020-02-18 VITALS — BP 127/58 | HR 64 | Temp 95.8°F | Resp 18 | Ht 66.0 in | Wt 161.5 lb

## 2020-02-18 VITALS — BP 120/80 | HR 79 | Ht 66.0 in | Wt 162.4 lb

## 2020-02-18 DIAGNOSIS — I10 Essential (primary) hypertension: Secondary | ICD-10-CM

## 2020-02-18 DIAGNOSIS — M810 Age-related osteoporosis without current pathological fracture: Secondary | ICD-10-CM

## 2020-02-18 DIAGNOSIS — R918 Other nonspecific abnormal finding of lung field: Secondary | ICD-10-CM

## 2020-02-18 DIAGNOSIS — R942 Abnormal results of pulmonary function studies: Secondary | ICD-10-CM | POA: Diagnosis not present

## 2020-02-18 DIAGNOSIS — Z87891 Personal history of nicotine dependence: Secondary | ICD-10-CM

## 2020-02-18 DIAGNOSIS — E7800 Pure hypercholesterolemia, unspecified: Secondary | ICD-10-CM

## 2020-02-18 LAB — CBC WITH DIFFERENTIAL/PLATELET
Basophils Absolute: 0.1 10*3/uL (ref 0.0–0.1)
Basophils Relative: 0.6 % (ref 0.0–3.0)
Eosinophils Absolute: 0.4 10*3/uL (ref 0.0–0.7)
Eosinophils Relative: 5.6 % — ABNORMAL HIGH (ref 0.0–5.0)
HCT: 44.3 % (ref 36.0–46.0)
Hemoglobin: 14.6 g/dL (ref 12.0–15.0)
Lymphocytes Relative: 25 % (ref 12.0–46.0)
Lymphs Abs: 1.9 10*3/uL (ref 0.7–4.0)
MCHC: 32.9 g/dL (ref 30.0–36.0)
MCV: 94.4 fl (ref 78.0–100.0)
Monocytes Absolute: 0.6 10*3/uL (ref 0.1–1.0)
Monocytes Relative: 8.4 % (ref 3.0–12.0)
Neutro Abs: 4.7 10*3/uL (ref 1.4–7.7)
Neutrophils Relative %: 60.4 % (ref 43.0–77.0)
Platelets: 233 10*3/uL (ref 150.0–400.0)
RBC: 4.69 Mil/uL (ref 3.87–5.11)
RDW: 13.7 % (ref 11.5–15.5)
WBC: 7.8 10*3/uL (ref 4.0–10.5)

## 2020-02-18 LAB — LIPID PANEL
Cholesterol: 136 mg/dL (ref 0–200)
HDL: 64.3 mg/dL (ref >39.00)
LDL Cholesterol: 53 mg/dL (ref 0–99)
NonHDL: 71.86
Total CHOL/HDL Ratio: 2
Triglycerides: 95 mg/dL (ref 0.0–149.0)
VLDL: 19 mg/dL (ref 0.0–40.0)

## 2020-02-18 LAB — AST: AST: 26 U/L (ref 0–37)

## 2020-02-18 LAB — ALT: ALT: 20 U/L (ref 0–35)

## 2020-02-18 NOTE — Progress Notes (Signed)
Synopsis: Referred in April 2021 for abnormal CT by Eileen Stanford, PA*  Subjective:   PATIENT ID: Dominique [name] GENDER: female DOB: Jun 12, 1941, MRN: [id number]  Chief Complaint  Patient presents with  . Consult    Pt is being referred due to lung nodules seen on CT.  Pt states she does have complaints of SOB with exertion.    This is a 79 year old female with a past medical history of COPD, atrial fibrillation, TAVR placement by Dr. [name] last year, hypertension moderate pulmonary hypertension.  Patient had CT imaging of the chest which revealed multiple bilateral lung nodules.  Some of which had groundglass areas others had in the left upper lobe adjacent to the pleura more solid component.  Patient underwent PET scan imaging which revealed left upper lobe nodule with an SUV of 10.  Other groundglass areas had low-level PET uptake.  There was some concern for potential infectious etiology.  Therefore patient was referred to pulmonary for recommendations and management.  At this time patient has no significant complaints.  She used to be a former smoker quit many years ago.  60-pack-year history quit in 2005.     Past Medical History:  Diagnosis Date  . Atrial fibrillation ([place])    a. dx 03/2019 while in hospital with cholecystitis  . COPD (chronic obstructive pulmonary disease) ([place])   . Critical lower limb ischemia    a. after TAVR developed ischemic L leg likely due to flap/closure, s/p emergent repair of left femoral artery with endarterectomy and Dacron patch angioplasty.  . Emphysema lung ([place])   . Hyperlipidemia    LDL goal < 70  . Hypertension   . Hypomagnesemia   . Normal coronary arteries 10/2018  . NSVT (nonsustained ventricular tachycardia) ([place]) 03/02/2019  . Pulmonary HTN ([place]) 06/05/2016   Moderate with PASP 72mmHg by echo 04/2016 likely Group 3 from COPD and possibly Group 2 from pulmonary venous HTN associated with moderate AS - f/u echo 12/2018 post TAVR showed  normal RVSP  . Pulmonary nodules    seen on pre TAVR CT, needs 12 month CT follow up  . S/P TAVR (transcatheter aortic valve replacement) 12/30/2018   Medtronic Evolut Pro-Plus THV (size 26 mm, serial # [id number]) via the TF approach  . Sacral fracture ([place])   . Severe aortic stenosis    a. s/p TAVR 12/2018.  Marland Kitchen Thoracic ascending aortic aneurysm [facility])    needs yearly follow up     Family History  Problem Relation Age of Onset  . Breast cancer Mother        breast  . Diabetes Son        type 1  . Cancer Brother        lung  . Colon cancer Neg Hx   . CAD Neg Hx   . Colon polyps Neg Hx   . Rectal cancer Neg Hx   . Stomach cancer Neg Hx      Past Surgical History:  Procedure Laterality Date  . APPENDECTOMY  1951  . CATARACT EXTRACTION    . CESAREAN SECTION     1971  . CHOLECYSTECTOMY N/A 04/28/2019   Procedure: LAPAROSCOPIC CHOLECYSTECTOMY;  Surgeon: Stark Klein, MD;  [facility];  Service: General;  Laterality: N/A;  . COLONOSCOPY  2018  . FEMORAL-POPLITEAL BYPASS GRAFT Left 12/31/2018   Procedure: PATCH ANGIOPLASTY REPAIR LEFT FEMORAL ARTERY USING HEMASHIELD PLATINUM FINESSE PATCH;  Surgeon: Rosetta Posner, MD;  Location: [place];  Service:  Vascular;  Laterality: Left;  . IR RADIOLOGY PERIPHERAL GUIDED IV START  05/30/2017  . IR RADIOLOGY PERIPHERAL GUIDED IV START  09/02/2018  . IR RADIOLOGY PERIPHERAL GUIDED IV START  09/02/2018  . IR US GUIDE VASC ACCESS LEFT  09/02/2018  . IR US GUIDE VASC ACCESS RIGHT  05/30/2017  . IR US GUIDE VASC ACCESS RIGHT  09/02/2018  . JOINT REPLACEMENT  2016   sacroplasty  . LAMINECTOMY    . RIGHT/LEFT HEART CATH AND CORONARY ANGIOGRAPHY N/A 11/27/2018   Procedure: RIGHT/LEFT HEART CATH AND CORONARY ANGIOGRAPHY;  Surgeon: Sherren Mocha, MD;  Location: [place] [facility];  Service: Cardiovascular;  Laterality: N/A;  . TEE WITHOUT CARDIOVERSION N/A 12/30/2018   Procedure: TRANSESOPHAGEAL ECHOCARDIOGRAM (TEE);  Surgeon: Sherren Mocha, MD;  Location:  [place] CV LAB;  Service: Open Heart Surgery;  Laterality: N/A;  . TOTAL HIP ARTHROPLASTY Bilateral   . TRANSCATHETER AORTIC VALVE REPLACEMENT, TRANSFEMORAL N/A 12/30/2018   Procedure: TRANSCATHETER AORTIC VALVE REPLACEMENT, TRANSFEMORAL;  Surgeon: Sherren Mocha, MD;  Location: [place] CV LAB;  Service: Open Heart Surgery;  Laterality: N/A;  . TUBAL LIGATION  1975    Social History   Socioeconomic History  . Marital status: Widowed    Spouse name: Not on file  . Number of children: 2  . Years of education: Not on file  . Highest education level: Not on file  Occupational History  . Occupation: n/a  Tobacco Use  . Smoking status: Former Smoker    Packs/day: 1.50    Years: 40.00    Pack years: 60.00    Types: Cigarettes    Quit date: 03/19/2004    Years since quitting: 15.9  . Smokeless tobacco: Never Used  Substance and Sexual Activity  . Alcohol use: Yes    Alcohol/week: 3.0 - 4.0 standard drinks    Types: 3 - 4 Glasses of wine per week    Comment: socially  . Drug use: No  . Sexual activity: Not on file  Other Topics Concern  . Not on file  Social History Narrative   From [place]   1 living son   Social Determinants of Health   Financial Resource Strain:   . Difficulty of Paying Living Expenses:   Food Insecurity:   . Worried About Charity fundraiser in the Last Year:   . Arboriculturist in the Last Year:   Transportation Needs:   . Film/video editor (Medical):   Marland Kitchen Lack of Transportation (Non-Medical):   Physical Activity:   . Days of Exercise per Week:   . Minutes of Exercise per Session:   Stress:   . Feeling of Stress :   Social Connections:   . Frequency of Communication with Friends and Family:   . Frequency of Social Gatherings with Friends and Family:   . Attends Religious Services:   . Active Member of Clubs or Organizations:   . Attends Archivist Meetings:   Marland Kitchen Marital Status:   Intimate Partner Violence:   . Fear of  Current or Ex-Partner:   . Emotionally Abused:   Marland Kitchen Physically Abused:   . Sexually Abused:      No Known Allergies   Outpatient Medications Prior to Visit  Medication Sig Dispense Refill  . acetaminophen (TYLENOL) 325 MG tablet Take 2 tablets (650 mg total) by mouth every 6 (six) hours as needed for mild pain (or Fever >/= 101).    Marland Kitchen amLODipine (NORVASC) 5 MG tablet  Take 1 tablet (5 mg total) by mouth daily. 30 tablet 6  . aspirin EC 81 MG tablet Take 81 mg by mouth daily.    . Calcium Carb-Cholecalciferol (CALCIUM 600 + D PO) Take 600 mg by mouth 3 (three) times a week.     . Cholecalciferol (VITAMIN D3) 5000 UNITS CAPS Take 5,000 Units by mouth daily.     Marland Kitchen denosumab (PROLIA) 60 MG/ML SOLN injection Inject 60 mg into the skin every 6 (six) months. Administer in upper arm, thigh, or abdomen    . EPINEPHrine 0.3 mg/0.3 mL IJ SOAJ injection Inject 0.3 mg into the muscle as needed for anaphylaxis.   12  . loratadine (CLARITIN) 10 MG tablet Take 10 mg by mouth daily as needed for allergies.     . magnesium oxide (MAG-OX) 400 MG tablet TAKE 1 TABLET (400 MG TOTAL) BY MOUTH DAILY. PT OVERDUE FOR  OV PLEASE CALL FOR APPT 30 tablet 11  . metoprolol succinate (TOPROL-XL) 25 MG 24 hr tablet TAKE 1 TABLET BY MOUTH EVERY DAY 90 tablet 2  . Multiple Vitamin (MULTIVITAMIN WITH MINERALS) TABS tablet Take 1 tablet by mouth daily.    . rosuvastatin (CRESTOR) 40 MG tablet TAKE 1 TABLET BY MOUTH EVERY DAY 90 tablet 3  . VENTOLIN HFA 108 (90 Base) MCG/ACT inhaler TAKE 2 PUFFS BY MOUTH EVERY 6 HOURS AS NEEDED FOR WHEEZE OR SHORTNESS OF BREATH 18 g 1  . amoxicillin (AMOXIL) 500 MG tablet Take 4 tablets (2,000mg ) one hour prior to all dental visits. (Patient not taking: Reported on 02/18/2020) 28 tablet 0   No facility-administered medications prior to visit.    Review of Systems  Constitutional: Negative for chills, fever, malaise/fatigue and weight loss.  HENT: Negative for hearing loss, sore throat  and tinnitus.   Eyes: Negative for blurred vision and double vision.  Respiratory: Negative for cough, hemoptysis, sputum production, shortness of breath, wheezing and stridor.   Cardiovascular: Negative for chest pain, palpitations, orthopnea, leg swelling and PND.  Gastrointestinal: Negative for abdominal pain, constipation, diarrhea, heartburn, nausea and vomiting.  Genitourinary: Negative for dysuria, hematuria and urgency.  Musculoskeletal: Negative for joint pain and myalgias.  Skin: Negative for itching and rash.  Neurological: Negative for dizziness, tingling, weakness and headaches.  Endo/Heme/Allergies: Negative for environmental allergies. Does not bruise/bleed easily.  Psychiatric/Behavioral: Negative for depression. The patient is not nervous/anxious and does not have insomnia.   All other systems reviewed and are negative.    Objective:  Physical Exam Vitals reviewed.  Constitutional:      General: She is not in acute distress.    Appearance: She is well-developed.  HENT:     Head: Normocephalic and atraumatic.  Eyes:     General: No scleral icterus.    Conjunctiva/sclera: Conjunctivae normal.     Pupils: Pupils are equal, round, and reactive to light.  Neck:     Vascular: No JVD.     Trachea: No tracheal deviation.  Cardiovascular:     Rate and Rhythm: Normal rate and regular rhythm.     Heart sounds: Normal heart sounds. No murmur.  Pulmonary:     Effort: Pulmonary effort is normal. No tachypnea, accessory muscle usage or respiratory distress.     Breath sounds: Normal breath sounds. No stridor. No wheezing, rhonchi or rales.  Abdominal:     General: Bowel sounds are normal. There is no distension.     Palpations: Abdomen is soft.     Tenderness: There is no  abdominal tenderness.  Musculoskeletal:        General: No tenderness.     Cervical back: Neck supple.  Lymphadenopathy:     Cervical: No cervical adenopathy.  Skin:    General: Skin is warm and dry.       Capillary Refill: Capillary refill takes less than 2 seconds.     Findings: No rash.  Neurological:     Mental Status: She is alert and oriented to person, place, and time.  Psychiatric:        Behavior: Behavior normal.      Vitals:   02/18/20 1425  BP: 120/80  Pulse: 79  SpO2: 93%  Weight: 162 lb 6.4 oz (73.7 kg)  Height: 5\' 6"  (1.676 m)   93% on RA BMI Readings from Last 3 Encounters:  02/18/20 26.21 kg/m  02/18/20 26.07 kg/m  01/27/20 25.66 kg/m   Wt Readings from Last 3 Encounters:  02/18/20 162 lb 6.4 oz (73.7 kg)  02/18/20 161 lb 8 oz (73.3 kg)  01/27/20 159 lb (72.1 kg)     CBC    Component Value Date/Time   WBC 10.1 06/08/2019 1525   RBC 4.49 06/08/2019 1525   HGB 13.0 06/08/2019 1525   HGB 13.6 11/13/2018 1310   HCT 40.9 06/08/2019 1525   HCT 39.5 11/13/2018 1310   PLT 418 (H) 06/08/2019 1525   PLT 335 11/13/2018 1310   MCV 91.1 06/08/2019 1525   MCV 88 11/13/2018 1310   MCH 29.0 06/08/2019 1525   MCHC 31.8 06/08/2019 1525   RDW 14.0 06/08/2019 1525   RDW 12.8 11/13/2018 1310   LYMPHSABS 2.2 06/08/2019 1525   LYMPHSABS 1.4 11/13/2018 1310   MONOABS 0.9 06/08/2019 1525   EOSABS 0.4 06/08/2019 1525   EOSABS 0.5 (H) 11/13/2018 1310   BASOSABS 0.1 06/08/2019 1525   BASOSABS 0.1 11/13/2018 1310     Chest Imaging: 01/14/2020: CT chest Subsolid 2.4 cm pulmonary nodule concerning for potential malignancy scattered groundglass nodule within the right upper lobe.  01/27/2020 nuclear medicine pet imaging abnormal PET activity within the left upper lobe does not go above mediastinal blood pool however does not exclude low-grade adenocarcinoma. Also has clustered nodularity within the left upper lobe concerning for potential inflammatory disorder.  Or infection. The patient's images have been independently reviewed by me.     Pulmonary Functions Testing Results: PFT Results Latest Ref Rng & Units 12/09/2018 06/18/2016  FVC-Pre L 1.77 2.08   FVC-Predicted Pre % 62 72  FVC-Post L 2.17 2.35  FVC-Predicted Post % 77 81  Pre FEV1/FVC % % 60 53  Post FEV1/FCV % % 49 53  FEV1-Pre L 1.06 1.11  FEV1-Predicted Pre % 50 51  FEV1-Post L 1.07 1.25  DLCO UNC% % 45 41  DLCO COR %Predicted % 63 60  TLC L 5.43 4.69  TLC % Predicted % 104 90  RV % Predicted % 145 99    FeNO: none  Pathology: none  Echocardiogram: none  Heart Catheterization: none    Assessment & Plan:     ICD-10-CM   1. Multiple lung nodules  R91.8 CT Super D Chest Wo Contrast  2. Abnormal CT scan of lung  R91.8   3. Abnormal PET of left lung  R94.2   4. Former smoker  Z87.891     Assessment:   This is a 79 year old female with recent abnormal CT imaging and abnormal PET scan imaging.  She has bilateral lung nodules both areas of subsolid and  solid components.  Some of the groundglass nodules appear inflammatory in nature.  The left upper lobe subpleural nodule is the most avid on PET scan.  This likely could represent the start of a primary bronchogenic carcinoma.  Also not excluded in the groundglass nodules for being a primary adenocarcinoma.  Plan Following Extensive Data Review & Interpretation:  . I reviewed prior external note(s) from 02/18/2020 Dr. Larose Kells. . I reviewed the result(s) of 01/14/2020 echocardiogram . I have ordered CT chest super D format to be completed last week of May 1 June.  Independent interpretation of tests . Review of patient's 01/14/2020 CT chest, 01/27/2020 nuclear medicine pet imaging images revealed bilateral lung nodules, areas of groundglass and subsolid nodules, more subsolid nodule left upper lobe.  PET scan with increased PET activity in left upper lobe nodule.. The patient's images have been independently reviewed by me.    Return to clinic in approximately 6 weeks after repeat imaging complete. Patient to follow-up with Dr. Lamonte Sakai in clinic to discuss results as I will be out of the office for a few weeks during this  timeframe.    Current Outpatient Medications:  .  acetaminophen (TYLENOL) 325 MG tablet, Take 2 tablets (650 mg total) by mouth every 6 (six) hours as needed for mild pain (or Fever >/= 101)., Disp:  , Rfl:  .  amLODipine (NORVASC) 5 MG tablet, Take 1 tablet (5 mg total) by mouth daily., Disp: 30 tablet, Rfl: 6 .  aspirin EC 81 MG tablet, Take 81 mg by mouth daily., Disp: , Rfl:  .  Calcium Carb-Cholecalciferol (CALCIUM 600 + D PO), Take 600 mg by mouth 3 (three) times a week. , Disp: , Rfl:  .  Cholecalciferol (VITAMIN D3) 5000 UNITS CAPS, Take 5,000 Units by mouth daily. , Disp: , Rfl:  .  denosumab (PROLIA) 60 MG/ML SOLN injection, Inject 60 mg into the skin every 6 (six) months. Administer in upper arm, thigh, or abdomen, Disp: , Rfl:  .  EPINEPHrine 0.3 mg/0.3 mL IJ SOAJ injection, Inject 0.3 mg into the muscle as needed for anaphylaxis. , Disp: , Rfl: 12 .  loratadine (CLARITIN) 10 MG tablet, Take 10 mg by mouth daily as needed for allergies. , Disp: , Rfl:  .  magnesium oxide (MAG-OX) 400 MG tablet, TAKE 1 TABLET (400 MG TOTAL) BY MOUTH DAILY. PT OVERDUE FOR [place] OV PLEASE CALL FOR APPT, Disp: 30 tablet, Rfl: 11 .  metoprolol succinate (TOPROL-XL) 25 MG 24 hr tablet, TAKE 1 TABLET BY MOUTH EVERY DAY, Disp: 90 tablet, Rfl: 2 .  Multiple Vitamin (MULTIVITAMIN WITH MINERALS) TABS tablet, Take 1 tablet by mouth daily., Disp: , Rfl:  .  rosuvastatin (CRESTOR) 40 MG tablet, TAKE 1 TABLET BY MOUTH EVERY DAY, Disp: 90 tablet, Rfl: 3 .  VENTOLIN HFA 108 (90 Base) MCG/ACT inhaler, TAKE 2 PUFFS BY MOUTH EVERY 6 HOURS AS NEEDED FOR WHEEZE OR SHORTNESS OF BREATH, Disp: 18 g, Rfl: 1 .  amoxicillin (AMOXIL) 500 MG tablet, Take 4 tablets (2,000mg ) one hour prior to all dental visits. (Patient not taking: Reported on 02/18/2020), Disp: 28 tablet, Rfl: 0   Garner Nash, DO [facility] 02/18/2020 2:34 PM

## 2020-02-18 NOTE — Patient Instructions (Signed)
Continue checking your blood pressure BP GOAL is between 110/65 and  135/85. If it is consistently higher or lower, let me know   GO TO THE LAB : Get the blood work     GO TO THE FRONT DESK, PLEASE SCHEDULE YOUR APPOINTMENTS Come back for a checkup in 6 months 

## 2020-02-18 NOTE — Progress Notes (Signed)
Subjective:    Patient ID: Dominique [name], female    DOB: Aug 18, 1941, 79 y.o.   MRN: [id number]  DOS:  02/18/2020 Type of visit - description: Follow-up Notes from cardiology and PET scan results reviewed. We talk about osteoporosis, high cholesterol, hypertension. Also preventive care issues    Review of Systems Denies fever chills.  No weight loss No chest pain, difficulty breathing.  No edema No cough or sputum production  Past Medical History:  Diagnosis Date  . Atrial fibrillation ([place])    a. dx 03/2019 while in hospital with cholecystitis  . COPD (chronic obstructive pulmonary disease) ([place])   . Critical lower limb ischemia    a. after TAVR developed ischemic L leg likely due to flap/closure, s/p emergent repair of left femoral artery with endarterectomy and Dacron patch angioplasty.  . Emphysema lung ([place])   . Hyperlipidemia    LDL goal < 70  . Hypertension   . Hypomagnesemia   . Normal coronary arteries 10/2018  . NSVT (nonsustained ventricular tachycardia) ([place]) 03/02/2019  . Pulmonary HTN ([place]) 06/05/2016   Moderate with PASP 26mmHg by echo 04/2016 likely Group 3 from COPD and possibly Group 2 from pulmonary venous HTN associated with moderate AS - f/u echo 12/2018 post TAVR showed normal RVSP  . Pulmonary nodules    seen on pre TAVR CT, needs 12 month CT follow up  . S/P TAVR (transcatheter aortic valve replacement) 12/30/2018   Medtronic Evolut Pro-Plus THV (size 26 mm, serial # [id number]) via the TF approach  . Sacral fracture ([place])   . Severe aortic stenosis    a. s/p TAVR 12/2018.  Marland Kitchen Thoracic ascending aortic aneurysm [facility])    needs yearly follow up    Past Surgical History:  Procedure Laterality Date  . APPENDECTOMY  1951  . CATARACT EXTRACTION    . CESAREAN SECTION     1971  . CHOLECYSTECTOMY N/A 04/28/2019   Procedure: LAPAROSCOPIC CHOLECYSTECTOMY;  Surgeon: Stark Klein, MD;  [facility];  Service: General;  Laterality: N/A;  . COLONOSCOPY  2018    . FEMORAL-POPLITEAL BYPASS GRAFT Left 12/31/2018   Procedure: PATCH ANGIOPLASTY REPAIR LEFT FEMORAL ARTERY USING HEMASHIELD PLATINUM FINESSE PATCH;  Surgeon: Rosetta Posner, MD;  Location: [place];  Service: Vascular;  Laterality: Left;  . IR RADIOLOGY PERIPHERAL GUIDED IV START  05/30/2017  . IR RADIOLOGY PERIPHERAL GUIDED IV START  09/02/2018  . IR RADIOLOGY PERIPHERAL GUIDED IV START  09/02/2018  . IR US GUIDE VASC ACCESS LEFT  09/02/2018  . IR US GUIDE VASC ACCESS RIGHT  05/30/2017  . IR US GUIDE VASC ACCESS RIGHT  09/02/2018  . JOINT REPLACEMENT  2016   sacroplasty  . LAMINECTOMY    . RIGHT/LEFT HEART CATH AND CORONARY ANGIOGRAPHY N/A 11/27/2018   Procedure: RIGHT/LEFT HEART CATH AND CORONARY ANGIOGRAPHY;  Surgeon: Sherren Mocha, MD;  Location: [place] [facility];  Service: Cardiovascular;  Laterality: N/A;  . TEE WITHOUT CARDIOVERSION N/A 12/30/2018   Procedure: TRANSESOPHAGEAL ECHOCARDIOGRAM (TEE);  Surgeon: Sherren Mocha, MD;  Location: [place] [facility];  Service: Open Heart Surgery;  Laterality: N/A;  . TOTAL HIP ARTHROPLASTY Bilateral   . TRANSCATHETER AORTIC VALVE REPLACEMENT, TRANSFEMORAL N/A 12/30/2018   Procedure: TRANSCATHETER AORTIC VALVE REPLACEMENT, TRANSFEMORAL;  Surgeon: Sherren Mocha, MD;  Location: [place] [facility];  Service: Open Heart Surgery;  Laterality: N/A;  . TUBAL LIGATION  1975    Allergies as of 02/18/2020   No Known Allergies  Medication List       Accurate as of February 18, 2020 11:59 PM. If you have any questions, ask your nurse or doctor.        acetaminophen 325 MG tablet Commonly known as: TYLENOL Take 2 tablets (650 mg total) by mouth every 6 (six) hours as needed for mild pain (or Fever >/= 101).   amLODipine 5 MG tablet Commonly known as: NORVASC Take 1 tablet (5 mg total) by mouth daily.   amoxicillin 500 MG tablet Commonly known as: AMOXIL Take 4 tablets (2,000mg ) one hour prior to all dental visits.   aspirin EC 81 MG tablet Take 81  mg by mouth daily.   CALCIUM 600 + D PO Take 600 mg by mouth 3 (three) times a week.   denosumab 60 MG/ML Soln injection Commonly known as: PROLIA Inject 60 mg into the skin every 6 (six) months. Administer in upper arm, thigh, or abdomen   EPINEPHrine 0.3 mg/0.3 mL Soaj injection Commonly known as: EPI-PEN Inject 0.3 mg into the muscle as needed for anaphylaxis.   loratadine 10 MG tablet Commonly known as: CLARITIN Take 10 mg by mouth daily as needed for allergies.   magnesium oxide 400 MG tablet Commonly known as: MAG-OX TAKE 1 TABLET (400 MG TOTAL) BY MOUTH DAILY. PT OVERDUE FOR [place] OV PLEASE CALL FOR APPT   metoprolol succinate 25 MG 24 hr tablet Commonly known as: TOPROL-XL TAKE 1 TABLET BY MOUTH EVERY DAY   multivitamin with minerals Tabs tablet Take 1 tablet by mouth daily.   rosuvastatin 40 MG tablet Commonly known as: CRESTOR TAKE 1 TABLET BY MOUTH EVERY DAY   Ventolin HFA 108 (90 Base) MCG/ACT inhaler Generic drug: albuterol TAKE 2 PUFFS BY MOUTH EVERY 6 HOURS AS NEEDED FOR WHEEZE OR SHORTNESS OF BREATH   Vitamin D3 125 MCG (5000 UT) Caps Take 5,000 Units by mouth daily.          Objective:   Physical Exam BP (!) 127/58 (BP Location: Left Arm, Patient Position: Sitting, Cuff Size: Small)   Pulse 64   Temp (!) 95.8 F (35.4 C) (Temporal)   Resp 18   Ht 5\' 6"  (1.676 m)   Wt 161 lb 8 oz (73.3 kg)   SpO2 96%   BMI 26.07 kg/m  General:   Well developed, NAD, BMI noted. HEENT:  Normocephalic . Face symmetric, atraumatic Lungs:  CTA B Normal respiratory effort, no intercostal retractions, no accessory muscle use. Heart: RRR,  no murmur.  Lower extremities: no pretibial edema bilaterally  Skin: Not pale. Not jaundice Neurologic:  alert & oriented X3.  Speech normal, gait appropriate for age and unassisted Psych--  Cognition and judgment appear intact.  Cooperative with normal attention span and concentration.  Behavior appropriate. No  anxious or depressed appearing.      Assessment    Assessment HTN Hyperlipidemia PULM: -H/o Emphysema, quit tobacco 2006 -CT chest angio  05-2016---> pulm nodule x2,reticular infiltrate ; f/u CT 11/2016 stable nodule, no f/u -CT chest angio  05-2016--->  enlarged R P.A. and Ao ; hac CT coronary 05/2017, f/u per cards -Abnormal PET scan 12-2019 CV: -DOE: (-) myoview  8-017, CT angio chest 05-2016:  no PE  -S/p TVAR 12/2018 -Paroxysmal A. fib DX 6/28/ 2020 in the context of acute cholecystitis, converted to NSR, not anticoagulated as of 05/25/2019 -Aspirin indefinitely, SBE prophylaxis with amoxicillin -MSK --DJD --Sacral Fx, sacral insufficiency, s/p sacroplasty (radiology) 07-2015 --H/o osteoporosis :  took fosamax while in  [place], Fosamax rx again by ortho after sacral Fx 07-2015, never took it, first Prolia 11-14-2015 --T score 08-2015 >> normal (likely normal d/t DJD?) H/o vit D def: Normal level 09-2015   PLAN  HTN: Well-controlled, continue amlodipine, metoprolol.  Last BMP very good High cholesterol: On Crestor: Check a FLP, AST, ALT S/p EVAR, carotid artery stenosis, paroxysmal A. fib Saw cardiology 01/14/2020, note reviewed, they rec aspirin 81 mg indefinitely and amoxicillin for SBE prophylaxis. Status post TVAR stable. Ectatic  ascending thoracic aorta, radiology recommended follow-up yearly with CTA or MRA.  Next Carotid artery stenosis, last ultrasound 05-2019, recommended medical therapy. Pulmonary nodules: PET scan ordered by cardiology done 12-2019: -Cluster of nodularity in the medial aspect of the left upper lobe noted , repeat CT in 4 to 6 weeks recommended. -Also groundglass opacity left upper lobe noted, repeat CT in 6 to 12 months To see pulmonary today Osteoporosis: On Prolia, last was 03/2019.  Bone density test?  Patient declines, it is actually appropriate to continue doing Prolia indefinitely without further DEXA's. Preventive care: Saw gynecology and had a  mammogram 05-2019 Had Covid shots RTC 6 months    This visit occurred during the SARS-CoV-2 public health emergency.  Safety protocols were in place, including screening questions prior to the visit, additional usage of staff PPE, and extensive cleaning of exam room while observing appropriate contact time as indicated for disinfecting solutions.

## 2020-02-18 NOTE — Progress Notes (Signed)
Pre visit review using our clinic review tool, if applicable. No additional management support is needed unless otherwise documented below in the visit note. 

## 2020-02-18 NOTE — Patient Instructions (Addendum)
Thank you for visiting Dr. Valeta Harms at [facility]. Today we recommend the following:  Orders Placed This Encounter  Procedures  . CT Super D Chest Wo Contrast   Return in about 6 weeks (around 03/31/2020) for w/ Dr. Lamonte Sakai .     Please do your part to reduce the spread of COVID-19.

## 2020-02-20 DIAGNOSIS — M810 Age-related osteoporosis without current pathological fracture: Secondary | ICD-10-CM | POA: Insufficient documentation

## 2020-02-20 NOTE — Assessment & Plan Note (Signed)
HTN: Well-controlled, continue amlodipine, metoprolol.  Last BMP very good High cholesterol: On Crestor: Check a FLP, AST, ALT S/p EVAR, carotid artery stenosis, paroxysmal A. fib Saw cardiology 01/14/2020, note reviewed, they rec aspirin 81 mg indefinitely and amoxicillin for SBE prophylaxis. Status post TVAR stable. Ectatic  ascending thoracic aorta, radiology recommended follow-up yearly with CTA or MRA.  Next Carotid artery stenosis, last ultrasound 05-2019, recommended medical therapy. Pulmonary nodules: PET scan ordered by cardiology done 12-2019: -Cluster of nodularity in the medial aspect of the left upper lobe noted , repeat CT in 4 to 6 weeks recommended. -Also groundglass opacity left upper lobe noted, repeat CT in 6 to 12 months To see pulmonary today Osteoporosis: On Prolia, last was 03/2019.  Bone density test?  Patient declines, it is actually appropriate to continue doing Prolia indefinitely without further DEXA's. Preventive care: Saw gynecology and had a mammogram 05-2019 Had Covid shots RTC 6 months

## 2020-02-24 ENCOUNTER — Institutional Professional Consult (permissible substitution): Payer: Medicare Other | Admitting: Pulmonary Disease

## 2020-02-28 ENCOUNTER — Other Ambulatory Visit: Payer: Self-pay | Admitting: Internal Medicine

## 2020-03-07 ENCOUNTER — Ambulatory Visit (HOSPITAL_COMMUNITY)
Admission: RE | Admit: 2020-03-07 | Discharge: 2020-03-07 | Disposition: A | Discharge status: Home / Self Care | Payer: Medicare Other | Source: Ambulatory Visit | Attending: Cardiovascular Disease | Admitting: Cardiovascular Disease

## 2020-03-07 ENCOUNTER — Other Ambulatory Visit: Payer: Self-pay

## 2020-03-07 DIAGNOSIS — I6523 Occlusion and stenosis of bilateral carotid arteries: Secondary | ICD-10-CM | POA: Insufficient documentation

## 2020-03-15 ENCOUNTER — Other Ambulatory Visit: Payer: Self-pay

## 2020-03-15 ENCOUNTER — Encounter: Payer: Self-pay | Admitting: Adult Health

## 2020-03-15 ENCOUNTER — Ambulatory Visit (INDEPENDENT_AMBULATORY_CARE_PROVIDER_SITE_OTHER): Payer: Medicare Other | Admitting: Adult Health

## 2020-03-15 VITALS — BP 132/62 | HR 74 | Ht 66.0 in | Wt 160.0 lb

## 2020-03-15 DIAGNOSIS — G43009 Migraine without aura, not intractable, without status migrainosus: Secondary | ICD-10-CM

## 2020-03-15 NOTE — Progress Notes (Signed)
[facility] [address] [place]. [place] [address] [phone]       OFFICE FOLLOW UP VISIT NOTE  [name] Date of Birth:  13-Jun-1941 Medical Record Number:  [id number]  Dominique [name] a lot of time Dominique [name] Referring MD: Belinda Fisher  Reason for Referral: Headache and blurred vision   Chief complaint: Chief Complaint  Patient presents with  . Follow-up    rm 9, alone   . Migraine    pt states she is doing well     HPI:   Today, 03/15/2020, [name] returns for 80-month follow-up.  Since prior visit, she has been doing well without new or reoccurring stroke symptoms. Occasional headaches with improvement with possibly one time monthly. Continues on aspirin and Crestor for secondary stroke prevention.  Blood pressure today 132/62. Recent LDL 53 on 02/18/2020. Recent carotid ultrasound ordered by cardiology on 03/07/2020 showed bilateral ICA 1 to 39% stenosis without evidence of right ICA 60 to 79% stenosis as evidenced on prior exam.  No concerns at this time.   History provided for reference purposes only Update 09/14/2019 Dr. Leonie Man : She returns for follow-up after last visit 3 months ago.  She is doing well she is not having recurrent TIA or stroke symptoms.  She still having some episodes of headache and blurred vision about once every 2 weeks or so.  These are much improved and not as severe or frequent.  She is unable to and without specific triggers for these episodes.  She underwent MRI scan of the brain on 06/30/2019 which I personally reviewed shows mild age-appropriate changes of small vessel disease but no acute abnormality stroke or tumor.  MRI of the brain shows no significant large vessel stenosis or occlusion.  She saw cardiologist Dr. [name] last week who ordered carotid ultrasound 12 done on 06/01/2019 showed 60 to 79% right ICA and 1-39% left ICA stenosis.  Evaluation of a cardiac monitoring not showing any recent A. fib.  Patient states  she is doing well and has had no new complaints  Initial consult 05/28/2019 Dr. Phillips Climes. Dominique [name] is an 79 year old Caucasian lady of [place] origin who is seen today for initial office consultation visit for headaches and blurred vision episodes.  History is obtained from her, review of electronic medical records and personally reviewed imaging films in PACS.  She states that she has a longstanding history of intermittent headaches and blurred vision since she was in her 80s.  She had outgrown them but when she was in her late 80s they started recurring at a frequency of once per month or so.  However since the last 3 weeks following gallbladder surgery on 04/28/2019 she has had daily headaches as well as blurred vision episodes which surprisingly seem to have stopped in the last 2 days.  She describes the headache as starting in the left temples with moderate intensity not accompanied by nausea vomiting light or sound sensitivity.  She takes Tylenol which seems to work and the headache goes away.  The blurred vision involving both eyes and last about 5 to 10 minutes.  After she has a cup of coffee with sugar that disappears as well.  She denies any true vertigo, speech difficulties, loss of vision, gait or balance problems or numbness.  She denies any episodes in the past suggestive of strokes or definite TIAs.  She was recently diagnosed to have atrial fibrillation on 04/26/2019 and is currently wearing a zio patch monitor to further  documented.  She has a chads 2 vascular score of 4.  She is currently on Toprol-XL she was previously on aspirin and Plavix but after her gallbladder surgery was switched only to aspirin alone.  She is tolerating well without bruising or bleeding.  She denies symptoms of scalp tenderness, jaw claudication, arthralgias or myalgias.  Blood pressures well controlled today it is 137/76.  She has history of aortic valve replacement transfemoral early following which she had some  complications requiring emergent femoral arterial endarterectomy with Dacron patch angioplasty.  She did undergo CT scan of the head on 05/25/2019 which I personally reviewed shows no acute abnormality.  She is not had an MRI.  She is scheduled to undergo carotid ultrasound on 06/01/2019.  She has not had any recent lipid profile or hemoglobin A1c checked.  ROS:   14 system review of systems is positive for no complaints and all other systems negative   PMH:  Past Medical History:  Diagnosis Date  . Atrial fibrillation ([place])    a. dx 03/2019 while in hospital with cholecystitis  . COPD (chronic obstructive pulmonary disease) ([place])   . Critical lower limb ischemia    a. after TAVR developed ischemic L leg likely due to flap/closure, s/p emergent repair of left femoral artery with endarterectomy and Dacron patch angioplasty.  . Emphysema lung ([place])   . Hyperlipidemia    LDL goal < 70  . Hypertension   . Hypomagnesemia   . Normal coronary arteries 10/2018  . NSVT (nonsustained ventricular tachycardia) ([place]) 03/02/2019  . Pulmonary HTN ([place]) 06/05/2016   Moderate with PASP 39mmHg by echo 04/2016 likely Group 3 from COPD and possibly Group 2 from pulmonary venous HTN associated with moderate AS - f/u echo 12/2018 post TAVR showed normal RVSP  . Pulmonary nodules    seen on pre TAVR CT, needs 12 month CT follow up  . 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 Sacral fracture ([place])   . Severe aortic stenosis    a. s/p TAVR 12/2018.  Marland Kitchen Thoracic ascending aortic aneurysm [facility])    needs yearly follow up    Social History:  Social History   Socioeconomic History  . Marital status: Widowed    Spouse name: Not on file  . Number of children: 2  . Years of education: Not on file  . Highest education level: Not on file  Occupational History  . Occupation: n/a  Tobacco Use  . Smoking status:  Former Smoker    Packs/day: 1.50    Years: 40.00    Pack years: 60.00    Types: Cigarettes    Quit date: 03/19/2004    Years since quitting: 16.0  . Smokeless tobacco: Never Used  Substance and Sexual Activity  . Alcohol use: Yes    Alcohol/week: 3.0 - 4.0 standard drinks    Types: 3 - 4 Glasses of wine per week    Comment: socially  . Drug use: No  . Sexual activity: Not on file  Other Topics Concern  . Not on file  Social History Narrative   From [place]   1 living son   Social Determinants of Health   Financial Resource Strain:   . Difficulty of Paying Living Expenses:   Food Insecurity:   . Worried About Charity fundraiser in the Last Year:   . [place] in the  Last Year:   Transportation Needs:   . Film/video editor (Medical):   Marland Kitchen Lack of Transportation (Non-Medical):   Physical Activity:   . Days of Exercise per Week:   . Minutes of Exercise per Session:   Stress:   . Feeling of Stress :   Social Connections:   . Frequency of Communication with Friends and Family:   . Frequency of Social Gatherings with Friends and Family:   . Attends Religious Services:   . Active Member of Clubs or Organizations:   . Attends Archivist Meetings:   Marland Kitchen Marital Status:   Intimate Partner Violence:   . Fear of Current or Ex-Partner:   . Emotionally Abused:   Marland Kitchen Physically Abused:   . Sexually Abused:     Medications:   Current Outpatient Medications on File Prior to Visit  Medication Sig Dispense Refill  . acetaminophen (TYLENOL) 325 MG tablet Take 2 tablets (650 mg total) by mouth every 6 (six) hours as needed for mild pain (or Fever >/= 101).    Marland Kitchen amLODipine (NORVASC) 5 MG tablet Take 1 tablet (5 mg total) by mouth daily. 90 tablet 1  . amoxicillin (AMOXIL) 500 MG tablet Take 4 tablets (2,000mg ) one hour prior to all dental visits. 28 tablet 0  . aspirin EC 81 MG tablet Take 81 mg by mouth daily.    . Calcium Carb-Cholecalciferol (CALCIUM 600 + D PO)  Take 600 mg by mouth 3 (three) times a week.     . Cholecalciferol (VITAMIN D3) 5000 UNITS CAPS Take 5,000 Units by mouth daily.     Marland Kitchen denosumab (PROLIA) 60 MG/ML SOLN injection Inject 60 mg into the skin every 6 (six) months. Administer in upper arm, thigh, or abdomen    . EPINEPHrine 0.3 mg/0.3 mL IJ SOAJ injection Inject 0.3 mg into the muscle as needed for anaphylaxis.   12  . loratadine (CLARITIN) 10 MG tablet Take 10 mg by mouth daily as needed for allergies.     . metoprolol succinate (TOPROL-XL) 25 MG 24 hr tablet TAKE 1 TABLET BY MOUTH EVERY DAY 90 tablet 2  . Multiple Vitamin (MULTIVITAMIN WITH MINERALS) TABS tablet Take 1 tablet by mouth daily.    . rosuvastatin (CRESTOR) 40 MG tablet TAKE 1 TABLET BY MOUTH EVERY DAY 90 tablet 3  . VENTOLIN HFA 108 (90 Base) MCG/ACT inhaler TAKE 2 PUFFS BY MOUTH EVERY 6 HOURS AS NEEDED FOR WHEEZE OR SHORTNESS OF BREATH 18 g 1   No current facility-administered medications on file prior to visit.    Allergies:  No Known Allergies   Vitals Today's Vitals   03/15/20 1346  BP: 132/62  Pulse: 74  Weight: 160 lb (72.6 kg)  Height: 5\' 6"  (1.676 m)   Body mass index is 25.82 kg/m.    Physical Exam General: Frail pleasant elderly Caucasian lady, seated, in no evident distress Head: head normocephalic and atraumatic.   Neck: supple with no carotid or supraclavicular bruits Cardiovascular: regular rate and rhythm, soft ejection murmur over the base. Musculoskeletal: no deformity Skin:  no rash/petichiae Vascular:  Normal pulses all extremities  Neurologic Exam Mental Status: Awake and fully alert. Oriented to place and time. Recent and remote memory intact. Attention span, concentration and fund of knowledge appropriate. Mood and affect appropriate.  Cranial Nerves: Pupils equal, briskly reactive to light. Extraocular movements full without nystagmus. Visual fields full to confrontation. Hearing intact. Facial sensation intact. Face, tongue,  palate moves normally and symmetrically.  Motor: Normal bulk and tone. Normal strength in all tested extremity muscles. Sensory.: intact to touch , pinprick , position and vibratory sensation.  Coordination: Rapid alternating movements normal in all extremities. Finger-to-nose and heel-to-shin performed accurately bilaterally. Gait and Station: Arises from chair without difficulty. Stance is normal. Gait demonstrates normal stride length and balance . Reflexes: 1+ and symmetric. Toes downgoing.       ASSESSMENT: 79 year old Caucasian lady with longstanding recurrent episodes of headache and transient blurred vision possibly migrainous in nature.  Headaches have been stable occurring once every 1 to 2 months.  New diagnosis of paroxysmal atrial fibrillation but low CHA2DS2-VASc score and placed on aspirin alone without indication for anticoagulation.  History of carotid stenosis with recent carotid ultrasound 1-39% bilaterally.     PLAN: -Continue aspirin 81 mg daily and Crestor for stroke prevention -Continue to follow with PCP and cardiology regularly for chronic disease management -headaches have been stable without indication to initiate emergent or prophylactic medication   Follow-up in 6 months or call earlier if needed  I spent 20 minutes of face-to-face and non-face-to-face time with patient.  This included previsit chart review, lab review, study review, order entry, electronic health record documentation, patient education regarding headaches and transient blurred vision, recent carotid ultrasound and answered all questions to patient satisfaction    Frann Rider, [facility]  [facility] [address] [place], [place] [address]  Phone [phone] Fax [phone] Note: This document was prepared with digital dictation and possible smart phrase technology. Any transcriptional errors that result from this process are unintentional.

## 2020-03-15 NOTE — Patient Instructions (Signed)
Continue aspirin 81 mg daily  and Crestor for secondary stroke prevention  Continue to follow up with PCP regarding cholesterol and blood pressure management   Continue to follow with cardiology for routine monitoring and repeat carotid ultrasound in 6-12 months (based on cardiology recommendations)  Continue to monitor blood pressure at home  Maintain strict control of hypertension with blood pressure goal below 130/90, diabetes with hemoglobin A1c goal below 6.5% and cholesterol with LDL cholesterol (bad cholesterol) goal below 70 mg/dL. I also advised the patient to eat a healthy diet with plenty of whole grains, cereals, fruits and vegetables, exercise regularly and maintain ideal body weight.  Followup in the future with me in 6 months or call earlier if needed       Thank you for coming to see [place] at [facility]. I hope we have been able to provide you high quality care today.  You may receive a patient satisfaction survey over the next few weeks. We would appreciate your feedback and comments so that we may continue to improve ourselves and the health of our patients.

## 2020-03-18 NOTE — Progress Notes (Signed)
I agree with the above plan 

## 2020-03-21 ENCOUNTER — Ambulatory Visit
Admission: RE | Admit: 2020-03-21 | Discharge: 2020-03-21 | Disposition: A | Discharge status: Home / Self Care | Payer: Medicare Other | Source: Ambulatory Visit | Attending: Pulmonary Disease | Admitting: Pulmonary Disease

## 2020-03-21 ENCOUNTER — Other Ambulatory Visit: Payer: Medicare Other

## 2020-03-21 DIAGNOSIS — R918 Other nonspecific abnormal finding of lung field: Secondary | ICD-10-CM | POA: Diagnosis not present

## 2020-03-23 ENCOUNTER — Other Ambulatory Visit: Payer: Self-pay

## 2020-03-23 ENCOUNTER — Encounter: Payer: Self-pay | Admitting: Cardiology

## 2020-03-23 ENCOUNTER — Ambulatory Visit (INDEPENDENT_AMBULATORY_CARE_PROVIDER_SITE_OTHER): Payer: Medicare Other | Admitting: Cardiology

## 2020-03-23 VITALS — BP 140/60 | HR 64 | Temp 97.1°F | Ht 66.0 in | Wt 162.0 lb

## 2020-03-23 DIAGNOSIS — Z952 Presence of prosthetic heart valve: Secondary | ICD-10-CM

## 2020-03-23 DIAGNOSIS — I6523 Occlusion and stenosis of bilateral carotid arteries: Secondary | ICD-10-CM | POA: Diagnosis not present

## 2020-03-23 DIAGNOSIS — R0600 Dyspnea, unspecified: Secondary | ICD-10-CM

## 2020-03-23 DIAGNOSIS — I2510 Atherosclerotic heart disease of native coronary artery without angina pectoris: Secondary | ICD-10-CM | POA: Diagnosis not present

## 2020-03-23 DIAGNOSIS — I712 Thoracic aortic aneurysm, without rupture: Secondary | ICD-10-CM

## 2020-03-23 DIAGNOSIS — I10 Essential (primary) hypertension: Secondary | ICD-10-CM

## 2020-03-23 DIAGNOSIS — R918 Other nonspecific abnormal finding of lung field: Secondary | ICD-10-CM

## 2020-03-23 DIAGNOSIS — I480 Paroxysmal atrial fibrillation: Secondary | ICD-10-CM

## 2020-03-23 DIAGNOSIS — I7121 Aneurysm of the ascending aorta, without rupture: Secondary | ICD-10-CM

## 2020-03-23 DIAGNOSIS — E7800 Pure hypercholesterolemia, unspecified: Secondary | ICD-10-CM

## 2020-03-23 NOTE — Assessment & Plan Note (Signed)
Most likely secondary to COPD/emphysema. Dr Lamonte Sakai to see

## 2020-03-23 NOTE — Assessment & Plan Note (Signed)
TAVR March [id number] complicated by ischemic Lt leg- emergency LF embolectomy. Echo at one year looked good.

## 2020-03-23 NOTE — Assessment & Plan Note (Signed)
Less than 50% LAD narrowing seen on coronary CT Aug 2018 and again November 2019

## 2020-03-23 NOTE — Assessment & Plan Note (Signed)
CA dopplers 03/07/2020 [id number] RICA [id number] LICA

## 2020-03-23 NOTE — Assessment & Plan Note (Signed)
4.0cm on CT may 2021- f/u one year recomended

## 2020-03-23 NOTE — Progress Notes (Signed)
Cardiology Office Note:    Date:  03/23/2020   ID:  Dominique [name], DOB 1941-03-29, MRN [id number]  PCP:  Colon Branch, MD  Cardiologist:  [name], MD  Electrophysiologist:  None   Referring MD: Colon Branch, MD   Chief Complaint  Patient presents with  . Follow-up    2 months.  . Shortness of Breath    History of Present Illness:    Dominique [name] is a 79 y.o. female with a hx of nonobstructive coronary disease with a less than 50% LAD by CT scan November 2019, vascular disease with moderate right internal carotid artery stenosis and mild left internal carotid artery stenosis, dyslipidemia, COPD, and aortic stenosis.  The patient had a TAVR March 2020.  This was complicated by left leg ischemia and she required emergency left femoral embolectomy.  She is recovered well otherwise since. She is in the office today for a routine follow up.   She was seen in the TAVR clinic in 01/14/2020.   Echocardiogram done 01/14/2020 showed an ejection fraction of 65% with a normal prosthetic aortic valve function.  Echocardiogram showed her aortic root to be normal.    CT scan in March done for follow up for pulmonary nodules showed a 4.0 cm a sending aorta, (this was not mentioned on her echocardiogram) and a f/u CT was recommended for one year.    The patient continues to have shortness of breath.  She has pulmonary nodules and a PET scan was done in March 2021.  This noted emphysema, no clear diagnosis of adenocarcinoma was made.  She had a "Super D" chest CT 03/21/2020 which showed ground glass nodules in the lungs bilaterally which appeared stable from March 2021.  Low-grade adenocarcinoma could not be excluded and a follow-up CT in 1 year was recommended.  She also has clustered nodularity in the left upper lobe.  Ascending aorta measured 3.9 cm.  There was calcification in the aorta and coronaries noted.  She has been referred to Dr. Lamonte Sakai for pulmonary evaluation.    Past Medical History:   Diagnosis Date  . Atrial fibrillation ([place])    a. dx 03/2019 while in hospital with cholecystitis  . COPD (chronic obstructive pulmonary disease) ([place])   . Critical lower limb ischemia    a. after TAVR developed ischemic L leg likely due to flap/closure, s/p emergent repair of left femoral artery with endarterectomy and Dacron patch angioplasty.  . Emphysema lung ([place])   . Hyperlipidemia    LDL goal < 70  . Hypertension   . Hypomagnesemia   . Normal coronary arteries 10/2018  . NSVT (nonsustained ventricular tachycardia) ([place]) 03/02/2019  . Pulmonary HTN ([place]) 06/05/2016   Moderate with PASP 104mmHg by echo 04/2016 likely Group 3 from COPD and possibly Group 2 from pulmonary venous HTN associated with moderate AS - f/u echo 12/2018 post TAVR showed normal RVSP  . Pulmonary nodules    seen on pre TAVR CT, needs 12 month CT follow up  . 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 Sacral fracture ([place])   . Severe aortic stenosis    a. s/p TAVR 12/2018.  Marland Kitchen Thoracic ascending aortic aneurysm [facility])    needs yearly follow up    Past Surgical History:  Procedure Laterality Date  . APPENDECTOMY  1951  . CATARACT EXTRACTION    . CESAREAN SECTION  [place] N/A 04/28/2019   Procedure: LAPAROSCOPIC CHOLECYSTECTOMY;  Surgeon: Stark Klein, MD;  Location: [facility];  Service: General;  Laterality: N/A;  . COLONOSCOPY  2018  . FEMORAL-POPLITEAL BYPASS GRAFT Left 12/31/2018   Procedure: PATCH ANGIOPLASTY REPAIR LEFT FEMORAL ARTERY USING HEMASHIELD PLATINUM FINESSE PATCH;  Surgeon: Rosetta Posner, MD;  Location: [facility];  Service: Vascular;  Laterality: Left;  . IR RADIOLOGY PERIPHERAL GUIDED IV START  05/30/2017  . IR RADIOLOGY PERIPHERAL GUIDED IV START  09/02/2018  . IR RADIOLOGY PERIPHERAL GUIDED IV START  09/02/2018  . IR US GUIDE VASC ACCESS LEFT  09/02/2018  . IR US GUIDE VASC ACCESS  RIGHT  05/30/2017  . IR US GUIDE VASC ACCESS RIGHT  09/02/2018  . JOINT REPLACEMENT  2016   sacroplasty  . LAMINECTOMY    . RIGHT/LEFT HEART CATH AND CORONARY ANGIOGRAPHY N/A 11/27/2018   Procedure: RIGHT/LEFT HEART CATH AND CORONARY ANGIOGRAPHY;  Surgeon: Sherren Mocha, MD;  Location: [facility];  Service: Cardiovascular;  Laterality: N/A;  . TEE WITHOUT CARDIOVERSION N/A 12/30/2018   Procedure: TRANSESOPHAGEAL ECHOCARDIOGRAM (TEE);  Surgeon: Sherren Mocha, MD;  Location: [facility] [facility];  Service: Open Heart Surgery;  Laterality: N/A;  . TOTAL HIP ARTHROPLASTY Bilateral   . TRANSCATHETER AORTIC VALVE REPLACEMENT, TRANSFEMORAL N/A 12/30/2018   Procedure: TRANSCATHETER AORTIC VALVE REPLACEMENT, TRANSFEMORAL;  Surgeon: Sherren Mocha, MD;  Location: [place] [facility];  Service: Open Heart Surgery;  Laterality: N/A;  . TUBAL LIGATION  1975    Current Medications: Current Meds  Medication Sig  . acetaminophen (TYLENOL) 325 MG tablet Take 2 tablets (650 mg total) by mouth every 6 (six) hours as needed for mild pain (or Fever >/= 101).  Marland Kitchen amLODipine (NORVASC) 5 MG tablet Take 1 tablet (5 mg total) by mouth daily.  Marland Kitchen amoxicillin (AMOXIL) 500 MG tablet Take 4 tablets (2,000mg ) one hour prior to all dental visits.  Marland Kitchen aspirin EC 81 MG tablet Take 81 mg by mouth daily.  . Calcium Carb-Cholecalciferol (CALCIUM 600 + D PO) Take 600 mg by mouth 3 (three) times a week.   . Cholecalciferol (VITAMIN D3) 5000 UNITS CAPS Take 5,000 Units by mouth daily.   Marland Kitchen denosumab (PROLIA) 60 MG/ML SOLN injection Inject 60 mg into the skin every 6 (six) months. Administer in upper arm, thigh, or abdomen  . EPINEPHrine 0.3 mg/0.3 mL IJ SOAJ injection Inject 0.3 mg into the muscle as needed for anaphylaxis.   Marland Kitchen loratadine (CLARITIN) 10 MG tablet Take 10 mg by mouth daily as needed for allergies.   . metoprolol succinate (TOPROL-XL) 25 MG 24 hr tablet TAKE 1 TABLET BY MOUTH EVERY DAY  . Multiple Vitamin  (MULTIVITAMIN WITH MINERALS) TABS tablet Take 1 tablet by mouth daily.  . rosuvastatin (CRESTOR) 40 MG tablet TAKE 1 TABLET BY MOUTH EVERY DAY  . VENTOLIN HFA 108 (90 Base) MCG/ACT inhaler TAKE 2 PUFFS BY MOUTH EVERY 6 HOURS AS NEEDED FOR WHEEZE OR SHORTNESS OF BREATH     Allergies:   Patient has no known allergies.   Social History   Socioeconomic History  . Marital status: Widowed    Spouse name: Not on file  . Number of children: 2  . Years of education: Not on file  . Highest education level: Not on file  Occupational History  . Occupation: n/a  Tobacco Use  . Smoking status: Former Smoker    Packs/day: 1.50    Years: 40.00    Pack  years: 60.00    Types: Cigarettes    Quit date: 03/19/2004    Years since quitting: 16.0  . Smokeless tobacco: Never Used  Substance and Sexual Activity  . Alcohol use: Yes    Alcohol/week: 3.0 - 4.0 standard drinks    Types: 3 - 4 Glasses of wine per week    Comment: socially  . Drug use: No  . Sexual activity: Not on file  Other Topics Concern  . Not on file  Social History Narrative   From [place]   1 living son   Social Determinants of Health   Financial Resource Strain:   . Difficulty of Paying Living Expenses:   Food Insecurity:   . Worried About Charity fundraiser in the Last Year:   . Arboriculturist in the Last Year:   Transportation Needs:   . Film/video editor (Medical):   Marland Kitchen Lack of Transportation (Non-Medical):   Physical Activity:   . Days of Exercise per Week:   . Minutes of Exercise per Session:   Stress:   . Feeling of Stress :   Social Connections:   . Frequency of Communication with Friends and Family:   . Frequency of Social Gatherings with Friends and Family:   . Attends Religious Services:   . Active Member of Clubs or Organizations:   . Attends Archivist Meetings:   Marland Kitchen Marital Status:      Family History: The patient's family history includes Breast cancer in her mother; Cancer in her  brother; Diabetes in her son. There is no history of Colon cancer, CAD, Colon polyps, Rectal cancer, or Stomach cancer.  ROS:   Please see the history of present illness.    Seen by Dr Leonie Man for ? TIA. Head CT negative, no MRI done- ASA and stain Rx recommended. All other systems reviewed and are negative.  EKGs/Labs/Other Studies Reviewed:    The following studies were reviewed today: Echo 01/14/2020- IMPRESSIONS    1. Left ventricular ejection fraction, by estimation, is 65 to 70%. The  left ventricle has normal function. The left ventricle has no regional  wall motion abnormalities. There is mild asymmetric left ventricular  hypertrophy of the basal-septal segment.  Left ventricular diastolic parameters are consistent with Grade I  diastolic dysfunction (impaired relaxation). Elevated left ventricular  end-diastolic pressure.  2. Right ventricular systolic function is normal. The right ventricular  size is normal. There is mildly elevated pulmonary artery systolic  pressure.  3. The mitral valve is normal in structure. Trivial mitral valve  regurgitation. No evidence of mitral stenosis.  4. TAVR bioprothesis well-seated and functioning properly. The aortic  valve has been repaired/replaced. Aortic valve regurgitation is not  visualized. No aortic stenosis is present. There is a 26 mm Medtronic  CoreValve Evolut Pro valve present in the  aortic position. Procedure Date: 12/2018. Aortic valve area, by VTI  measures 1.38 cm. Aortic valve mean gradient measures 8.0 mmHg. Aortic  valve Vmax measures 1.88 m/s.  5. The inferior vena cava is normal in size with greater than 50%  respiratory variability, suggesting right atrial pressure of 3 mmHg.    CT scan-03/21/2020 IMPRESSION: 1. Ground-glass nodules in the lungs bilaterally are stable from 12/31/2019 but appear more prominent than on 12/09/2018. Low-grade adenocarcinoma cannot be excluded. Follow-up CT chest  without contrast in 1 year is recommended. 2. Clustered nodularity in the medial aspect of the left upper lobe, similar and likely infectious or inflammatory in  etiology. This can be reassessed at 1 year follow-up recommended above. 3. Aortic atherosclerosis (ICD10-I70.0). Coronary artery calcification. 4. Enlarged pulmonary arteries, indicative of pulmonary arterial hypertension  EKG:  EKG is ordered today.  The ekg ordered today demonstrates NSR, VR 64, LAE  Recent Labs: 04/28/2019: Magnesium 1.8; TSH 1.185 01/14/2020: BUN 22; Creatinine, Ser 0.70; Potassium 4.7; Sodium 144 02/18/2020: ALT 20; Hemoglobin 14.6; Platelets 233.0  Recent Lipid Panel    Component Value Date/Time   CHOL 136 02/18/2020 1102   CHOL 139 05/28/2019 1740   TRIG 95.0 02/18/2020 1102   HDL 64.30 02/18/2020 1102   HDL 54 05/28/2019 1740   CHOLHDL 2 02/18/2020 1102   VLDL 19.0 02/18/2020 1102   LDLCALC 53 02/18/2020 1102   LDLCALC 59 05/28/2019 1740    Physical Exam:    VS:  BP 140/60 (BP Location: Left Arm, Patient Position: Sitting, Cuff Size: Normal)   Pulse 64   Temp (!) 97.1 F (36.2 C)   Ht 5\' 6"  (1.676 m)   Wt 162 lb (73.5 kg)   BMI 26.15 kg/m     Wt Readings from Last 3 Encounters:  03/23/20 162 lb (73.5 kg)  03/15/20 160 lb (72.6 kg)  02/18/20 162 lb 6.4 oz (73.7 kg)     GEN:  Well nourished, well developed in no acute distress HEENT: Normal NECK: No JVD; No carotid bruits CARDIAC: RRR, no murmurs, rubs, gallops RESPIRATORY:  Few bronchial breath sounds LUL ABDOMEN: Soft, non-tender, non-distended MUSCULOSKELETAL:  No edema; No deformity  SKIN: Warm and dry NEUROLOGIC:  Alert and oriented x 3 PSYCHIATRIC:  Normal affect   ASSESSMENT:    S/P TAVR (transcatheter aortic valve replacement) TAVR March [id number] complicated by ischemic Lt leg- emergency LF embolectomy. Echo at one year looked good.    Dyspnea Most likely secondary to COPD/emphysema. Dr Lamonte Sakai to see  Essential  hypertension Controlled  Hyperlipidemia On statin rx-LDL 53 April 2021  CAD (coronary artery disease), native coronary artery Less than 50% LAD narrowing seen on coronary CT Aug 2018 and again November 2019  Multiple lung nodules Super D CT done 03/21/2020- f/u with Dr Lamonte Sakai 04/04/2020 scheduled  Carotid stenosis, bilateral CA dopplers 03/07/2020 [id number] RICA [id number] LICA  Thoracic ascending aortic aneurysm ([place]) 4.0cm on CT may 2021- f/u one year recomended  PLAN:    Same Rx- f/u with Dr [name] in march 2021.   Medication Adjustments/Labs and Tests Ordered: Current medicines are reviewed at length with the patient today.  Concerns regarding medicines are outlined above.  Orders Placed This Encounter  Procedures  . EKG 12-Lead   No orders of the defined types were placed in this encounter.   Patient Instructions  Medication Instructions:  Your physician recommends that you continue on your current medications as directed. Please refer to the Current Medication list given to you today.  *If you need a refill on your cardiac medications before your next appointment, please call your pharmacy*   Follow-Up: At [facility], you and your health needs are our priority.  As part of our continuing mission to provide you with exceptional heart care, we have created designated Provider Care Teams.  These Care Teams include your primary Cardiologist (physician) and Advanced Practice Providers (APPs -  Physician Assistants and Nurse Practitioners) who all work together to provide you with the care you need, when you need it.  We recommend signing up for the patient portal called "MyChart".  Sign up information is provided on this After Visit  Summary.  MyChart is used to connect with patients for Virtual Visits (Telemedicine).  Patients are able to view lab/test results, encounter notes, upcoming appointments, etc.  Non-urgent messages can be sent to your provider as well.   To learn  more about what you can do with MyChart, go to [email or web address].    Your next appointment:   March 2022  The format for your next appointment:   In Person  Provider:   [name], MD   Other Instructions Please call our office 2 months in advance (January 2022) to schedule your follow-up appointment.     Angelena Form, PA-C  03/23/2020 11:44 AM    [facility]

## 2020-03-23 NOTE — Patient Instructions (Signed)
Medication Instructions:  Your physician recommends that you continue on your current medications as directed. Please refer to the Current Medication list given to you today.  *If you need a refill on your cardiac medications before your next appointment, please call your pharmacy*   Follow-Up: At [facility], you and your health needs are our priority.  As part of our continuing mission to provide you with exceptional heart care, we have created designated Provider Care Teams.  These Care Teams include your primary Cardiologist (physician) and Advanced Practice Providers (APPs -  Physician Assistants and Nurse Practitioners) who all work together to provide you with the care you need, when you need it.  We recommend signing up for the patient portal called "MyChart".  Sign up information is provided on this After Visit Summary.  MyChart is used to connect with patients for Virtual Visits (Telemedicine).  Patients are able to view lab/test results, encounter notes, upcoming appointments, etc.  Non-urgent messages can be sent to your provider as well.   To learn more about what you can do with MyChart, go to [email or web address].    Your next appointment:   March 2022  The format for your next appointment:   In Person  Provider:   [name], MD   Other Instructions Please call our office 2 months in advance (January 2022) to schedule your follow-up appointment.

## 2020-03-23 NOTE — Assessment & Plan Note (Signed)
On statin rx-LDL 53 April 2021

## 2020-03-23 NOTE — Assessment & Plan Note (Signed)
Controlled.  

## 2020-03-23 NOTE — Assessment & Plan Note (Signed)
Super D CT done 03/21/2020- f/u with Dr Lamonte Sakai 04/04/2020 scheduled

## 2020-04-04 ENCOUNTER — Encounter: Payer: Self-pay | Admitting: Emergency Medicine

## 2020-04-04 ENCOUNTER — Other Ambulatory Visit: Payer: Self-pay

## 2020-04-04 ENCOUNTER — Ambulatory Visit (INDEPENDENT_AMBULATORY_CARE_PROVIDER_SITE_OTHER): Payer: Medicare Other | Admitting: Emergency Medicine

## 2020-04-04 DIAGNOSIS — I6523 Occlusion and stenosis of bilateral carotid arteries: Secondary | ICD-10-CM | POA: Diagnosis not present

## 2020-04-04 DIAGNOSIS — R918 Other nonspecific abnormal finding of lung field: Secondary | ICD-10-CM

## 2020-04-04 NOTE — Patient Instructions (Signed)
Dr. Lamonte Sakai will review your CT scan with Dr. Valeta Harms and then make recommendations about how to proceed, either with follow-up scans or possibly a diagnostic procedure.

## 2020-04-04 NOTE — Assessment & Plan Note (Signed)
Multiple pulmonary nodules.  Appearance is suspicious although stable on short interval repeat CT scan from May.  Diagnostics are going to be difficult because the hypermetabolic nodule is in a location that will be very difficult to reach by navigational bronchoscopy.  The poorly formed left upper lobe and groundglass right upper lobe nodules would be reachable.  I will review the films with Dr. Valeta Harms and will come up with a strategy that may include proceeding with navigation.  Her PFTs likely preclude wedge resection.

## 2020-04-04 NOTE — Progress Notes (Signed)
Subjective:    Patient ID: Dominique [name], female    DOB: 29-Sep-1941, 79 y.o.   MRN: [id number]  HPI Dominique [name] is a 79 with a history of tobacco (60 pack years), COPD, atrial fibrillation, TAVR, hypertension, moderate secondary PAH.Marland Kitchen  She was seen by Dr. Valeta Harms in April to evaluate abnormal chest imaging in March. CT chest 01/14/2020 showed a stable (compared with 06/01/2017) subsolid posterior left upper lobe focus 2.4 x 1.3 cm distorting the left major fissure, a small medial LUL pleural based nodule, scattered small groundglass nodules largest 1.8 cm in the posterior right upper lobe.  PET scan done on 01/27/2020 showed that the very medial LUL nodule was hypermetabolic, no hypermetabolism noted in the other nodular foci.  A repeat CT scan of the chest done on 03/21/2020 reviewed by me shows no significant change in the appearance, size of the nodules.  In particular no resolution of the clustered left upper lobe nodularity that was thought to possibly be inflammatory or infectious in nature.  PFT 12/09/2018 reviewed >> FEV1 1.06L  MDM: Reviewed CT scans, PET scan as above, PFT as above Reviewed chart notes from Dr. Valeta Harms 02/18/2020, cardiology 03/23/2020   Review of Systems As above  Past Medical History:  Diagnosis Date  . Atrial fibrillation ([place])    a. dx 03/2019 while in hospital with cholecystitis  . COPD (chronic obstructive pulmonary disease) ([place])   . Critical lower limb ischemia    a. after TAVR developed ischemic L leg likely due to flap/closure, s/p emergent repair of left femoral artery with endarterectomy and Dacron patch angioplasty.  . Emphysema lung ([place])   . Hyperlipidemia    LDL goal < 70  . Hypertension   . Hypomagnesemia   . Normal coronary arteries 10/2018  . NSVT (nonsustained ventricular tachycardia) ([place]) 03/02/2019  . Pulmonary HTN ([place]) 06/05/2016   Moderate with PASP 26mmHg by echo 04/2016 likely Group 3 from COPD and possibly Group 2 from pulmonary venous  HTN associated with moderate AS - f/u echo 12/2018 post TAVR showed normal RVSP  . Pulmonary nodules    seen on pre TAVR CT, needs 12 month CT follow up  . 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 Sacral fracture ([place])   . Severe aortic stenosis    a. s/p TAVR 12/2018.  Marland Kitchen Thoracic ascending aortic aneurysm [facility])    needs yearly follow up     Family History  Problem Relation Age of Onset  . Breast cancer Mother        breast  . Diabetes Son        type 1  . Cancer Brother        lung  . Colon cancer Neg Hx   . CAD Neg Hx   . Colon polyps Neg Hx   . Rectal cancer Neg Hx   . Stomach cancer Neg Hx      Social History   Socioeconomic History  . Marital status: Widowed    Spouse name: Not on file  . Number of children: 2  . Years of education: Not on file  . Highest education level: Not on file  Occupational History  . Occupation: n/a  Tobacco Use  . Smoking status: Former Smoker    Packs/day: 1.50    Years: 40.00    Pack years: 60.00    Types: Cigarettes  Quit date: 03/19/2004    Years since quitting: 16.0  . Smokeless tobacco: Never Used  Substance and Sexual Activity  . Alcohol use: Yes    Alcohol/week: 3.0 - 4.0 standard drinks    Types: 3 - 4 Glasses of wine per week    Comment: socially  . Drug use: No  . Sexual activity: Not on file  Other Topics Concern  . Not on file  Social History Narrative   From [place]   1 living son   Social Determinants of Health   Financial Resource Strain:   . Difficulty of Paying Living Expenses:   Food Insecurity:   . Worried About Charity fundraiser in the Last Year:   . Arboriculturist in the Last Year:   Transportation Needs:   . Film/video editor (Medical):   Marland Kitchen Lack of Transportation (Non-Medical):   Physical Activity:   . Days of Exercise per Week:   . Minutes of Exercise per Session:   Stress:   .  Feeling of Stress :   Social Connections:   . Frequency of Communication with Friends and Family:   . Frequency of Social Gatherings with Friends and Family:   . Attends Religious Services:   . Active Member of Clubs or Organizations:   . Attends Archivist Meetings:   Marland Kitchen Marital Status:   Intimate Partner Violence:   . Fear of Current or Ex-Partner:   . Emotionally Abused:   Marland Kitchen Physically Abused:   . Sexually Abused:     Retired Marine scientist Never had TB or a positive test.  From [place], [place], has lived in [place] and [place]  No Known Allergies   Outpatient Medications Prior to Visit  Medication Sig Dispense Refill  . acetaminophen (TYLENOL) 325 MG tablet Take 2 tablets (650 mg total) by mouth every 6 (six) hours as needed for mild pain (or Fever >/= 101).    Marland Kitchen amLODipine (NORVASC) 5 MG tablet Take 1 tablet (5 mg total) by mouth daily. 90 tablet 1  . amoxicillin (AMOXIL) 500 MG tablet Take 4 tablets (2,000mg ) one hour prior to all dental visits. 28 tablet 0  . aspirin EC 81 MG tablet Take 81 mg by mouth daily.    . Calcium Carb-Cholecalciferol (CALCIUM 600 + D PO) Take 600 mg by mouth 3 (three) times a week.     . Cholecalciferol (VITAMIN D3) 5000 UNITS CAPS Take 5,000 Units by mouth daily.     Marland Kitchen denosumab (PROLIA) 60 MG/ML SOLN injection Inject 60 mg into the skin every 6 (six) months. Administer in upper arm, thigh, or abdomen    . EPINEPHrine 0.3 mg/0.3 mL IJ SOAJ injection Inject 0.3 mg into the muscle as needed for anaphylaxis.   12  . loratadine (CLARITIN) 10 MG tablet Take 10 mg by mouth daily as needed for allergies.     . metoprolol succinate (TOPROL-XL) 25 MG 24 hr tablet TAKE 1 TABLET BY MOUTH EVERY DAY 90 tablet 2  . Multiple Vitamin (MULTIVITAMIN WITH MINERALS) TABS tablet Take 1 tablet by mouth daily.    . rosuvastatin (CRESTOR) 40 MG tablet TAKE 1 TABLET BY MOUTH EVERY DAY 90 tablet 3  . VENTOLIN HFA 108 (90 Base) MCG/ACT inhaler TAKE 2 PUFFS BY MOUTH EVERY 6 HOURS AS  NEEDED FOR WHEEZE OR SHORTNESS OF BREATH 18 g 1   No facility-administered medications prior to visit.        Objective:   Physical Exam Vitals:  04/04/20 1128  BP: 130/64  Pulse: 65  Temp: 98 F (36.7 C)  TempSrc: Oral  SpO2: 96%  Weight: 162 lb 9.6 oz (73.8 kg)  Height: 5\' 6"  (1.676 m)   Gen: Pleasant, well-nourished, in no distress,  normal affect  ENT: No lesions,  mouth clear,  oropharynx clear, no postnasal drip  Neck: No JVD, no stridor  Lungs: No use of accessory muscles, no crackles or wheezing on normal respiration, no wheeze on forced expiration  Cardiovascular: RRR, heart sounds normal, no murmur or gallops, no peripheral edema  Musculoskeletal: No deformities, no cyanosis or clubbing  Neuro: alert, awake, non focal  Skin: Warm, no lesions or rash       Assessment & Plan:  Multiple lung nodules Multiple pulmonary nodules.  Appearance is suspicious although stable on short interval repeat CT scan from May.  Diagnostics are going to be difficult because the hypermetabolic nodule is in a location that will be very difficult to reach by navigational bronchoscopy.  The poorly formed left upper lobe and groundglass right upper lobe nodules would be reachable.  I will review the films with Dr. Valeta Harms and will come up with a strategy that may include proceeding with navigation.  Her PFTs likely preclude wedge resection.  Baltazar Apo, MD, PhD 04/04/2020, 11:57 AM [facility] [phone] or if no answer [phone]

## 2020-04-06 ENCOUNTER — Other Ambulatory Visit: Payer: Self-pay

## 2020-04-06 ENCOUNTER — Encounter: Payer: Self-pay | Admitting: Internal Medicine

## 2020-04-06 ENCOUNTER — Ambulatory Visit (INDEPENDENT_AMBULATORY_CARE_PROVIDER_SITE_OTHER): Payer: Medicare Other | Admitting: Internal Medicine

## 2020-04-06 VITALS — BP 151/54 | HR 68 | Temp 98.5°F | Resp 16 | Ht 66.0 in | Wt 162.0 lb

## 2020-04-06 DIAGNOSIS — S70361A Insect bite (nonvenomous), right thigh, initial encounter: Secondary | ICD-10-CM

## 2020-04-06 DIAGNOSIS — R918 Other nonspecific abnormal finding of lung field: Secondary | ICD-10-CM

## 2020-04-06 DIAGNOSIS — I6523 Occlusion and stenosis of bilateral carotid arteries: Secondary | ICD-10-CM | POA: Diagnosis not present

## 2020-04-06 DIAGNOSIS — W57XXXA Bitten or stung by nonvenomous insect and other nonvenomous arthropods, initial encounter: Secondary | ICD-10-CM | POA: Diagnosis not present

## 2020-04-06 NOTE — Progress Notes (Signed)
Pre visit review using our clinic review tool, if applicable. No additional management support is needed unless otherwise documented below in the visit note. 

## 2020-04-06 NOTE — Progress Notes (Signed)
Subjective:    Patient ID: Dominique [name], female    DOB: [birth year], 79 y.o.   MRN: [id number]  DOS:  04/06/2020 Type of visit - description: Acute The patient reports a tick bite here in [facility] on 03/25/2020. The next day she remove it and shortly after the area got red. It was not itchy. Overall in the last couple of days the redness has decreased.  Review of Systems Denies fever chills. No headaches She is chronically tired, slightly worse in the last few days? No unusual aches or pains  Past Medical History:  Diagnosis Date   Atrial fibrillation ([place])    a. dx 03/2019 while in hospital with cholecystitis   COPD (chronic obstructive pulmonary disease) ([place])    Critical lower limb ischemia    a. after TAVR developed ischemic L leg likely due to flap/closure, s/p emergent repair of left femoral artery with endarterectomy and Dacron patch angioplasty.   Emphysema lung (HCC)    Hyperlipidemia    LDL goal < 70   Hypertension    Hypomagnesemia    Normal coronary arteries 10/2018   NSVT (nonsustained ventricular tachycardia) ([place]) 03/02/2019   Pulmonary HTN ([place]) 06/05/2016   Moderate with PASP 72mmHg by echo 04/2016 likely Group 3 from COPD and possibly Group 2 from pulmonary venous HTN associated with moderate AS - f/u echo 12/2018 post TAVR showed normal RVSP   Pulmonary nodules    seen on pre TAVR CT, needs 12 month CT follow up   S/P TAVR (transcatheter aortic valve replacement) 12/30/2018   Medtronic Evolut Pro-Plus THV (size 26 mm, serial # [id number]) via the TF approach   Sacral fracture (HCC)    Severe aortic stenosis    a. s/p TAVR 12/2018.   Thoracic ascending aortic aneurysm ([place])    needs yearly follow up    Past Surgical History:  Procedure Laterality Date   [place] N/A 04/28/2019   Procedure: LAPAROSCOPIC CHOLECYSTECTOMY;  Surgeon: Stark Klein, MD;   Location: [facility];  Service: General;  Laterality: N/A;   COLONOSCOPY  2018   FEMORAL-POPLITEAL BYPASS GRAFT Left 12/31/2018   Procedure: PATCH ANGIOPLASTY REPAIR LEFT FEMORAL ARTERY USING [place];  Surgeon: Rosetta Posner, MD;  Location: [facility];  Service: Vascular;  Laterality: Left;   IR RADIOLOGY PERIPHERAL GUIDED IV START  05/30/2017   IR RADIOLOGY PERIPHERAL GUIDED IV START  09/02/2018   IR RADIOLOGY PERIPHERAL GUIDED IV START  09/02/2018   IR US GUIDE VASC ACCESS LEFT  09/02/2018   IR US GUIDE VASC ACCESS RIGHT  05/30/2017   IR US GUIDE VASC ACCESS RIGHT  09/02/2018   JOINT REPLACEMENT  2016   sacroplasty   LAMINECTOMY     RIGHT/LEFT HEART CATH AND CORONARY ANGIOGRAPHY N/A 11/27/2018   Procedure: RIGHT/LEFT HEART CATH AND CORONARY ANGIOGRAPHY;  Surgeon: Sherren Mocha, MD;  Location: [facility];  Service: Cardiovascular;  Laterality: N/A;   TEE WITHOUT CARDIOVERSION N/A 12/30/2018   Procedure: TRANSESOPHAGEAL ECHOCARDIOGRAM (TEE);  Surgeon: Sherren Mocha, MD;  Location: [facility];  Service: Open Heart Surgery;  Laterality: N/A;   TOTAL HIP ARTHROPLASTY Bilateral    TRANSCATHETER AORTIC VALVE REPLACEMENT, TRANSFEMORAL N/A 12/30/2018   Procedure: TRANSCATHETER AORTIC VALVE REPLACEMENT, TRANSFEMORAL;  Surgeon: Sherren Mocha, MD;  Location: [facility];  Service: Open Heart Surgery;  Laterality: N/A;  TUBAL LIGATION  1975    Allergies as of 04/06/2020   No Known Allergies     Medication List       Accurate as of April 06, 2020 11:59 PM. If you have any questions, ask your nurse or doctor.        acetaminophen 325 MG tablet Commonly known as: TYLENOL Take 2 tablets (650 mg total) by mouth every 6 (six) hours as needed for mild pain (or Fever >/= 101).   amLODipine 5 MG tablet Commonly known as: NORVASC Take 1 tablet (5 mg total) by mouth daily.   amoxicillin 500 MG tablet Commonly known as: AMOXIL Take 4 tablets (2,000mg )  one hour prior to all dental visits.   aspirin EC 81 MG tablet Take 81 mg by mouth daily.   CALCIUM 600 + D PO Take 600 mg by mouth 3 (three) times a week.   denosumab 60 MG/ML Soln injection Commonly known as: PROLIA Inject 60 mg into the skin every 6 (six) months. Administer in upper arm, thigh, or abdomen   EPINEPHrine 0.3 mg/0.3 mL Soaj injection Commonly known as: EPI-PEN Inject 0.3 mg into the muscle as needed for anaphylaxis.   loratadine 10 MG tablet Commonly known as: CLARITIN Take 10 mg by mouth daily as needed for allergies.   metoprolol succinate 25 MG 24 hr tablet Commonly known as: TOPROL-XL TAKE 1 TABLET BY MOUTH EVERY DAY   multivitamin with minerals Tabs tablet Take 1 tablet by mouth daily.   rosuvastatin 40 MG tablet Commonly known as: CRESTOR TAKE 1 TABLET BY MOUTH EVERY DAY   Ventolin HFA 108 (90 Base) MCG/ACT inhaler Generic drug: albuterol TAKE 2 PUFFS BY MOUTH EVERY 6 HOURS AS NEEDED FOR WHEEZE OR SHORTNESS OF BREATH   Vitamin D3 125 MCG (5000 UT) Caps Take 5,000 Units by mouth daily.          Objective:   Physical Exam BP (!) 151/54 (BP Location: Left Arm, Patient Position: Sitting, Cuff Size: Small)    Pulse 68    Temp 98.5 F (36.9 C) (Temporal)    Resp 16    Ht 5\' 6"  (1.676 m)    Wt 162 lb (73.5 kg)    SpO2 93%    BMI 26.15 kg/m  General:   Well developed, NAD, BMI noted. HEENT:  Normocephalic . Face symmetric, atraumatic Skin: Has redness at the inner right thigh.  Nontender to palpation, not warm.  See picture Neurologic:  alert & oriented X3.  Speech normal, gait appropriate for age and unassisted Psych--  Cognition and judgment appear intact.  Cooperative with normal attention span and concentration.  Behavior appropriate. No anxious or depressed appearing.        Assessment      Assessment HTN Hyperlipidemia PULM: -H/o Emphysema, quit tobacco 2006 -CT chest angio  05-2016---> pulm nodule x2,reticular infiltrate ;  f/u CT 11/2016 stable nodule, no f/u -CT chest angio  05-2016--->  enlarged R P.A. and Ao ; hac CT coronary 05/2017, f/u per cards -Abnormal PET scan 12-2019 CV: -DOE: (-) myoview  8-017, CT angio chest 05-2016:  no PE  -S/p TVAR 12/2018 -Paroxysmal A. fib DX 6/28/ 2020 in the context of acute cholecystitis, converted to NSR, not anticoagulated as of 05/25/2019 -Aspirin indefinitely, SBE prophylaxis with amoxicillin -MSK --DJD --Sacral Fx, sacral insufficiency, s/p sacroplasty (radiology) 07-2015 --H/o osteoporosis :  took fosamax while in [place], Fosamax rx again by ortho after sacral Fx 07-2015, never took it, first Prolia  11-14-2015 --T score 08-2015 >> normal (likely normal d/t DJD?) H/o vit D def: Normal level 09-2015   PLAN  Tick bite: Tick bite few days ago, incident occurred in [place].  No sxs c/w early Lyme disease, the redness is not warm and is already getting better. We talk about the pros/cons of empiric treatment with antibiotic, at the end we agreed to close observation for the next 2 weeks, if she develop any symptoms she will call, we could either then treat empirically with antibiotics and/or get Lyme serology. Recommend to get hydrocortisone OTC Lung nodules: Last visit with pulmonary 04/04/2020, her PFTs likely preclude with resection, pulmonary is consider navigational bronchoscopy.   This visit occurred during the SARS-CoV-2 public health emergency.  Safety protocols were in place, including screening questions prior to the visit, additional usage of staff PPE, and extensive cleaning of exam room while observing appropriate contact time as indicated for disinfecting solutions.

## 2020-04-06 NOTE — Patient Instructions (Signed)
Please apply hydrocortisone 1% over-the-counter to the redness for the next 2 to 3 days  Watch for fever, chills, headaches, unusual aches or pains or any other rashes. If that is the case let me know.

## 2020-04-07 NOTE — Assessment & Plan Note (Signed)
Tick bite: Tick bite few days ago, incident occurred in [place].  No sxs c/w early Lyme disease, the redness is not warm and is already getting better. We talk about the pros/cons of empiric treatment with antibiotic, at the end we agreed to close observation for the next 2 weeks, if she develop any symptoms she will call, we could either then treat empirically with antibiotics and/or get Lyme serology. Recommend to get hydrocortisone OTC Lung nodules: Last visit with pulmonary 04/04/2020, her PFTs likely preclude with resection, pulmonary is consider navigational bronchoscopy.

## 2020-04-18 ENCOUNTER — Other Ambulatory Visit: Payer: Self-pay | Admitting: Pulmonary Disease

## 2020-04-18 DIAGNOSIS — R918 Other nonspecific abnormal finding of lung field: Secondary | ICD-10-CM

## 2020-04-25 ENCOUNTER — Telehealth: Payer: Self-pay | Admitting: Pulmonary Disease

## 2020-04-25 NOTE — Telephone Encounter (Signed)
Patient needs a CT scan, she called our office back. Did the [facility] team call to schedule this patient?  We are trying to determine who called her. Please call [phone] if attempting to reach her.

## 2020-04-25 NOTE — Telephone Encounter (Signed)
Wasn't the [facility].  Pt's CT isn't set to be scheduled until December, so it will not get scheduled until November.

## 2020-04-25 NOTE — Telephone Encounter (Signed)
Called and spoke with pt to see if she knew who had tried to call her. Pt stated that no VM was left. Looking at last OV, pt is to have a CT repeated in 6 months and that RB was going to review the one she just had to discuss with BI. Stated to pt that we would call her 1 month before to get CT scheduled and she verbalized understanding. Nothing further needed.

## 2020-05-26 ENCOUNTER — Ambulatory Visit: Payer: Self-pay | Admitting: *Deleted

## 2020-06-07 IMAGING — US ULTRASOUND ABDOMEN LIMITED
1 series · 14 of 25 positions shown · non-contrast
Comparison: CT 04/26/2019

CLINICAL DATA: 77-year-old female with right upper quadrant pain CT
demonstrates cholecystitis

EXAM:
ULTRASOUND ABDOMEN LIMITED RIGHT UPPER QUADRANT

[Series 1: ultrasound abdomen limited · 14 of 41 slices shown]
[im 1/41]
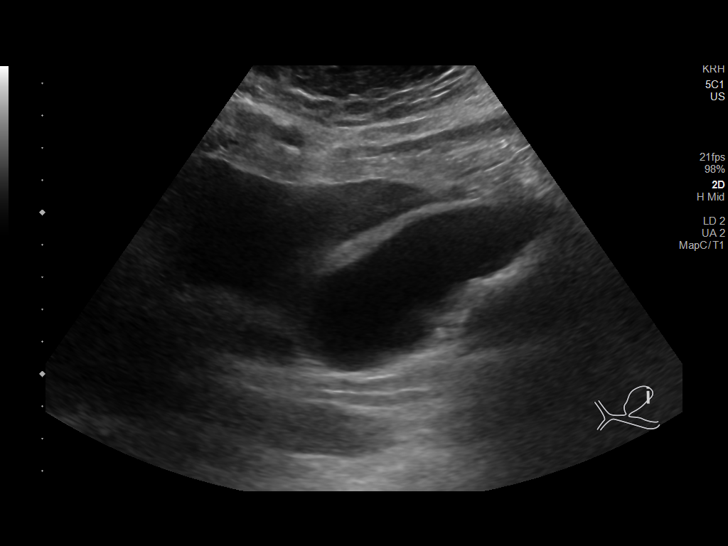
[im 4/41]
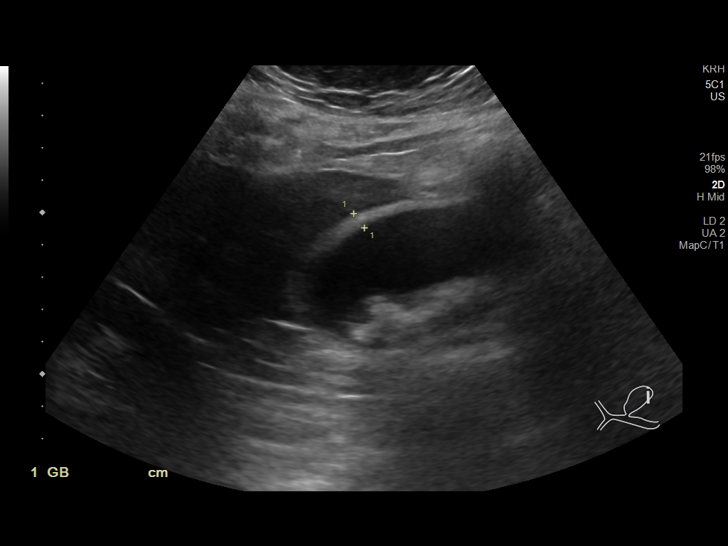
[im 7/41]
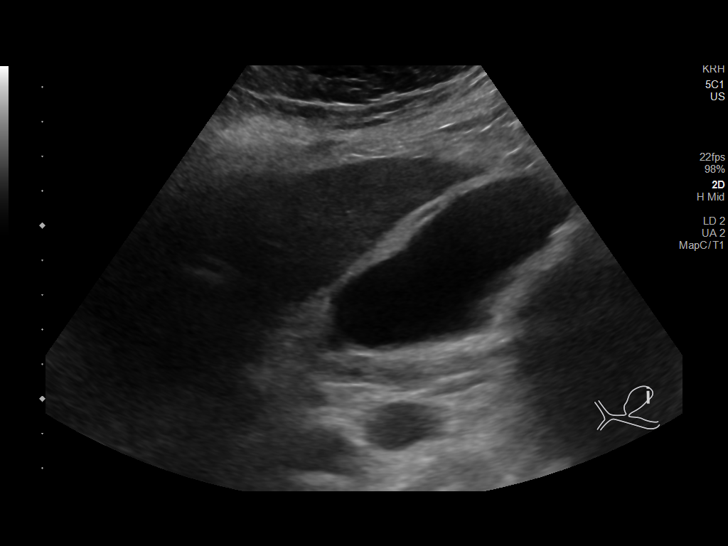
[im 11/41]
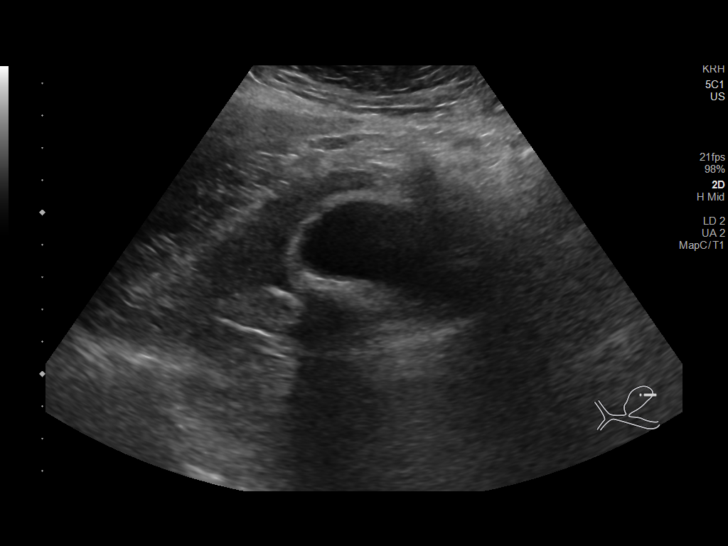
[im 14/41]
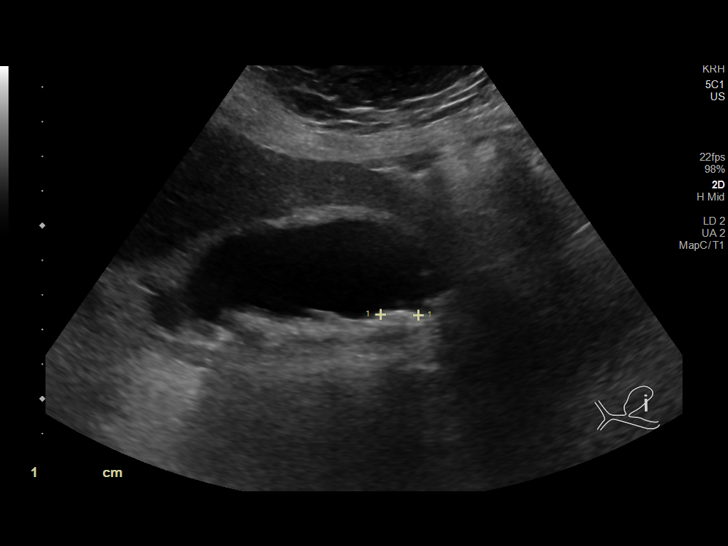
[im 16/41]
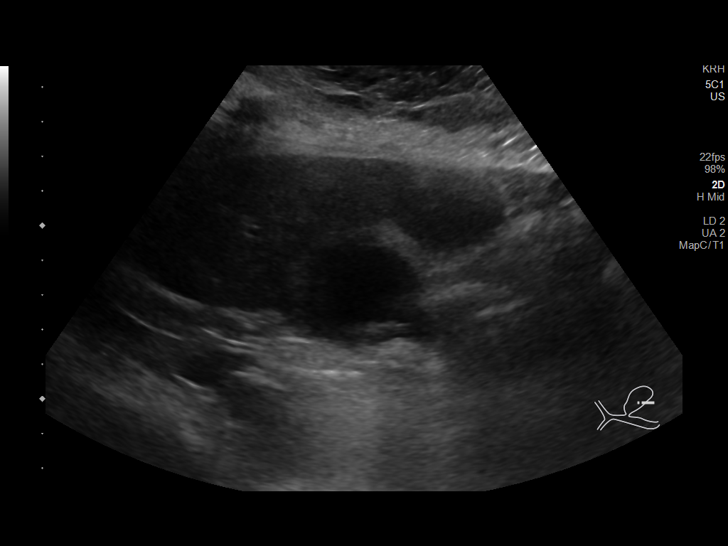
[im 19/41]
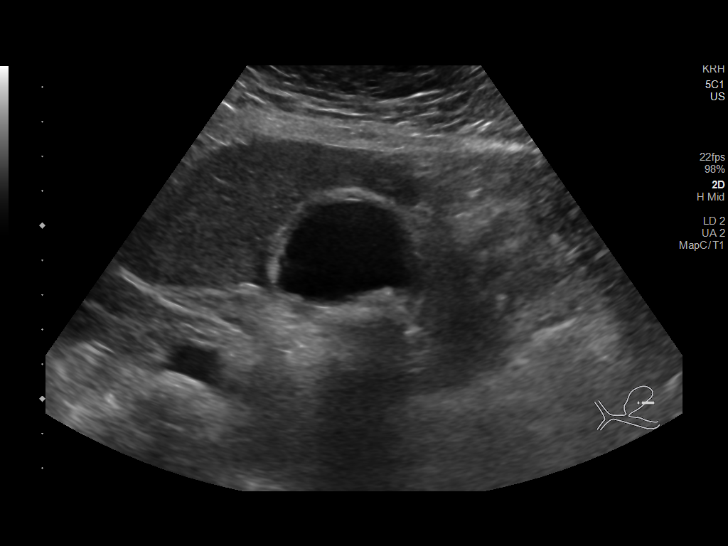
[im 22/41]
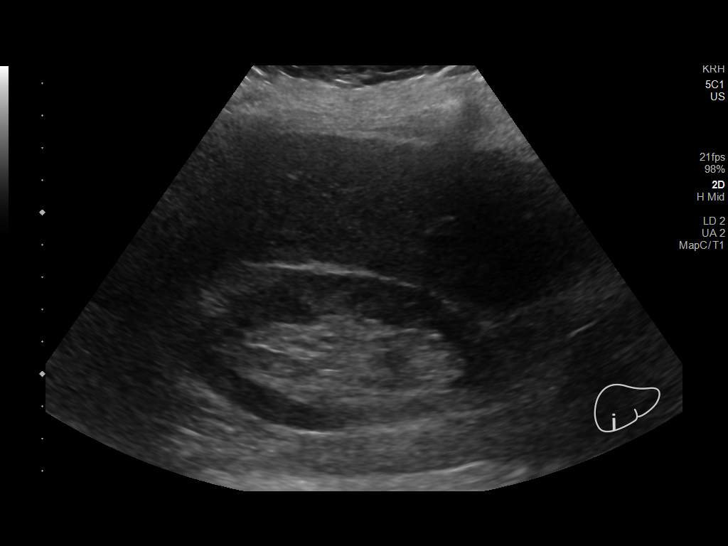
[im 26/41]
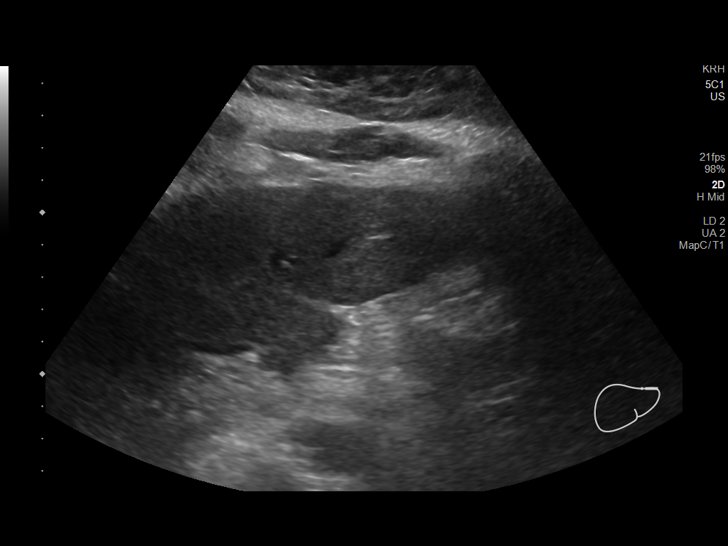
[im 27/41]
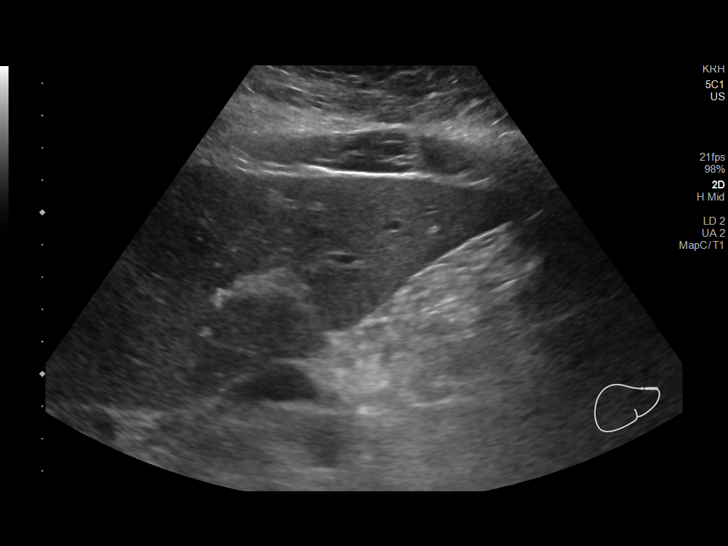
[im 31/41]
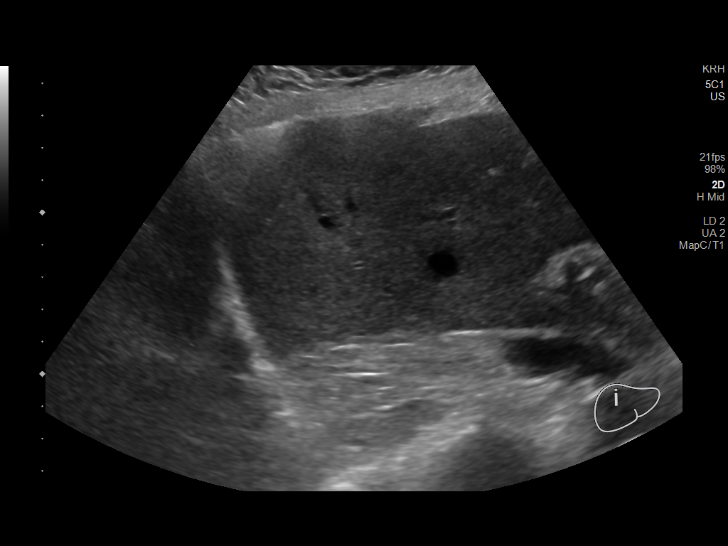
[im 34/41]
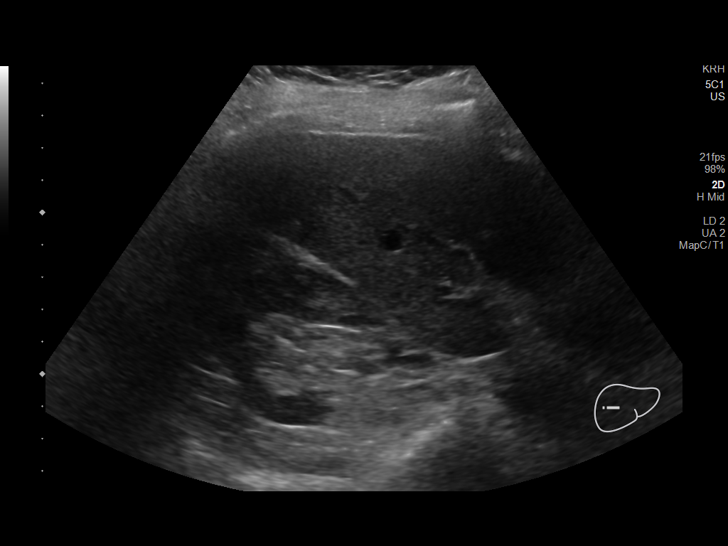
[im 37/41]
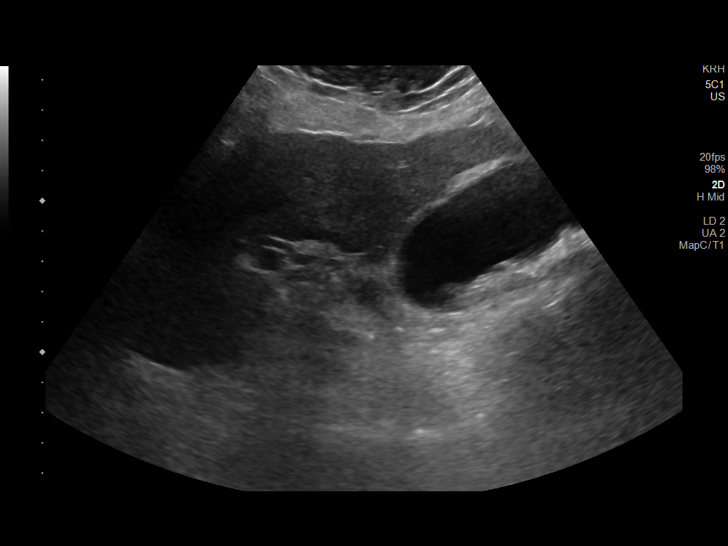
[im 41/41]
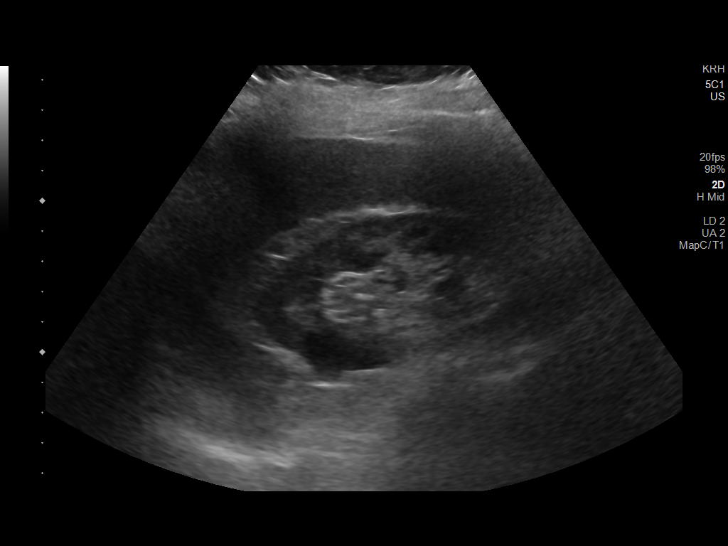

[14 of 25 positions shown; findings below may reference images not displayed]

FINDINGS: Gallbladder:

Sonographic Murphy's sign recorded as negative. Gallbladder wall
thickening measuring 6 mm. Hyperechoic material with posterior
shadowing layer dependently within the gallbladder lumen, compatible
with recent CT. Trace pericholecystic fluid.

Common bile duct:

Diameter: 3 mm

Liver:

No focal lesion identified. Within normal limits in parenchymal
echogenicity. Portal vein is patent on color Doppler imaging with
normal direction of blood flow towards the liver.
IMPRESSION: Sonographic survey demonstrates cholelithiasis. The ultrasound study
is equivocal for acute cholecystitis, as the sonographic Murphy's
sign is negative, while there are suspicious findings of gallbladder
wall thickening and pericholecystic fluid in the setting of
cholelithiasis. If a confirmatory imaging test is needed for acute
calculus cholecystitis, HIDA study would be the test of choice.

## 2020-06-24 NOTE — Progress Notes (Signed)
I connected with Dominique [name] today by telephone and verified that I am speaking with the correct person using two identifiers. Location patient: home Location provider: work Persons participating in the virtual visit: patient, Marine scientist.    I discussed the limitations, risks, security and privacy concerns of performing an evaluation and management service by telephone and the availability of in person appointments. I also discussed with the patient that there may be a patient responsible charge related to this service. The patient expressed understanding and verbally consented to this telephonic visit.    Interactive audio and video telecommunications were attempted between this provider and patient, however failed, due to patient having technical difficulties OR patient did not have access to video capability.  We continued and completed visit with audio only.  Some vital signs may be absent or patient reported.   Subjective:   Dominique [name] is a 79 y.o. female who presents for Medicare Annual (Subsequent) preventive examination.  Review of Systems    Cardiac Risk Factors include: advanced age (>66men, >45 women);dyslipidemia;hypertension     Objective:     Advanced Directives 06/27/2020 06/08/2019 06/02/2019 05/25/2019 04/26/2019 12/30/2018 12/26/2018  Does Patient Have a Medical Advance Directive? Yes Yes Yes Yes Yes Yes Yes  Type of Paramedic of [place];Living will Living will;Healthcare Power of [place];Living will [place];Living will [place];Living will [place];Living will  Does patient want to make changes to medical advance directive? No - Patient declined - - No - Patient declined No - Patient declined No - Patient declined -  Copy of [place] in Chart? No - copy requested - - Yes - validated most recent copy scanned in chart (See row information) No - copy  requested Yes - validated most recent copy scanned in chart (See row information) Yes - validated most recent copy scanned in chart (See row information)  Would patient like information on creating a medical advance directive? - - - - No - Patient declined - -    Current Medications (verified) Outpatient Encounter Medications as of 06/27/2020  Medication Sig  . acetaminophen (TYLENOL) 325 MG tablet Take 2 tablets (650 mg total) by mouth every 6 (six) hours as needed for mild pain (or Fever >/= 101).  Marland Kitchen amLODipine (NORVASC) 5 MG tablet Take 1 tablet (5 mg total) by mouth daily.  Marland Kitchen amoxicillin (AMOXIL) 500 MG tablet Take 4 tablets (2,000mg ) one hour prior to all dental visits.  Marland Kitchen aspirin EC 81 MG tablet Take 81 mg by mouth daily.  . Calcium Carb-Cholecalciferol (CALCIUM 600 + D PO) Take 600 mg by mouth 3 (three) times a week.   . Cholecalciferol (VITAMIN D3) 5000 UNITS CAPS Take 5,000 Units by mouth daily.   Marland Kitchen denosumab (PROLIA) 60 MG/ML SOLN injection Inject 60 mg into the skin every 6 (six) months. Administer in upper arm, thigh, or abdomen  . loratadine (CLARITIN) 10 MG tablet Take 10 mg by mouth daily as needed for allergies.   . metoprolol succinate (TOPROL-XL) 25 MG 24 hr tablet TAKE 1 TABLET BY MOUTH EVERY DAY  . Multiple Vitamin (MULTIVITAMIN WITH MINERALS) TABS tablet Take 1 tablet by mouth daily.  . rosuvastatin (CRESTOR) 40 MG tablet TAKE 1 TABLET BY MOUTH EVERY DAY  . VENTOLIN HFA 108 (90 Base) MCG/ACT inhaler TAKE 2 PUFFS BY MOUTH EVERY 6 HOURS AS NEEDED FOR WHEEZE OR SHORTNESS OF BREATH  . EPINEPHrine 0.3 mg/0.3 mL  IJ SOAJ injection Inject 0.3 mg into the muscle as needed for anaphylaxis.  (Patient not taking: Reported on 06/27/2020)   No facility-administered encounter medications on file as of 06/27/2020.    Allergies (verified) Patient has no known allergies.   History: Past Medical History:  Diagnosis Date  . Atrial fibrillation ([place])    a. dx 03/2019 while in hospital with  cholecystitis  . COPD (chronic obstructive pulmonary disease) ([place])   . Critical lower limb ischemia    a. after TAVR developed ischemic L leg likely due to flap/closure, s/p emergent repair of left femoral artery with endarterectomy and Dacron patch angioplasty.  . Emphysema lung ([place])   . Hyperlipidemia    LDL goal < 70  . Hypertension   . Hypomagnesemia   . Normal coronary arteries 10/2018  . NSVT (nonsustained ventricular tachycardia) ([place]) 03/02/2019  . Pulmonary HTN ([place]) 06/05/2016   Moderate with PASP 58mmHg by echo 04/2016 likely Group 3 from COPD and possibly Group 2 from pulmonary venous HTN associated with moderate AS - f/u echo 12/2018 post TAVR showed normal RVSP  . Pulmonary nodules    seen on pre TAVR CT, needs 12 month CT follow up  . 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 Sacral fracture ([place])   . Severe aortic stenosis    a. s/p TAVR 12/2018.  Marland Kitchen Thoracic ascending aortic aneurysm [facility])    needs yearly follow up   Past Surgical History:  Procedure Laterality Date  . APPENDECTOMY  1951  . CATARACT EXTRACTION    . CESAREAN SECTION     1971  . CHOLECYSTECTOMY N/A 04/28/2019   Procedure: LAPAROSCOPIC CHOLECYSTECTOMY;  Surgeon: Stark Klein, MD;  Location: [facility];  Service: General;  Laterality: N/A;  . COLONOSCOPY  2018  . FEMORAL-POPLITEAL BYPASS GRAFT Left 12/31/2018   Procedure: PATCH ANGIOPLASTY REPAIR LEFT FEMORAL ARTERY USING HEMASHIELD PLATINUM FINESSE PATCH;  Surgeon: Rosetta Posner, MD;  Location: [place];  Service: Vascular;  Laterality: Left;  . IR RADIOLOGY PERIPHERAL GUIDED IV START  05/30/2017  . IR RADIOLOGY PERIPHERAL GUIDED IV START  09/02/2018  . IR RADIOLOGY PERIPHERAL GUIDED IV START  09/02/2018  . IR US GUIDE VASC ACCESS LEFT  09/02/2018  . IR US GUIDE VASC ACCESS RIGHT  05/30/2017  . IR US GUIDE VASC ACCESS RIGHT  09/02/2018  . JOINT REPLACEMENT  2016     sacroplasty  . LAMINECTOMY    . RIGHT/LEFT HEART CATH AND CORONARY ANGIOGRAPHY N/A 11/27/2018   Procedure: RIGHT/LEFT HEART CATH AND CORONARY ANGIOGRAPHY;  Surgeon: Sherren Mocha, MD;  Location: [place] [facility];  Service: Cardiovascular;  Laterality: N/A;  . TEE WITHOUT CARDIOVERSION N/A 12/30/2018   Procedure: TRANSESOPHAGEAL ECHOCARDIOGRAM (TEE);  Surgeon: Sherren Mocha, MD;  Location: [place] [facility];  Service: Open Heart Surgery;  Laterality: N/A;  . TOTAL HIP ARTHROPLASTY Bilateral   . TRANSCATHETER AORTIC VALVE REPLACEMENT, TRANSFEMORAL N/A 12/30/2018   Procedure: TRANSCATHETER AORTIC VALVE REPLACEMENT, TRANSFEMORAL;  Surgeon: Sherren Mocha, MD;  Location: [place] [facility];  Service: Open Heart Surgery;  Laterality: N/A;  . TUBAL LIGATION  1975   Family History  Problem Relation Age of Onset  . Breast cancer Mother        breast  . Diabetes Son        type 1  . Cancer Brother        lung  .  Colon cancer Neg Hx   . CAD Neg Hx   . Colon polyps Neg Hx   . Rectal cancer Neg Hx   . Stomach cancer Neg Hx    Social History   Socioeconomic History  . Marital status: Widowed    Spouse name: Not on file  . Number of children: 2  . Years of education: Not on file  . Highest education level: Not on file  Occupational History  . Occupation: n/a  Tobacco Use  . Smoking status: Former Smoker    Packs/day: 1.50    Years: 40.00    Pack years: 60.00    Types: Cigarettes    Quit date: 03/19/2004    Years since quitting: 16.2  . Smokeless tobacco: Never Used  Vaping Use  . Vaping Use: Never used  Substance and Sexual Activity  . Alcohol use: Yes    Alcohol/week: 3.0 - 4.0 standard drinks    Types: 3 - 4 Glasses of wine per week    Comment: socially  . Drug use: No  . Sexual activity: Not on file  Other Topics Concern  . Not on file  Social History Narrative   From [place]   1 living son   Social Determinants of Health   Financial Resource Strain: Low Risk    . Difficulty of Paying Living Expenses: Not hard at all  Food Insecurity: No Food Insecurity  . Worried About Charity fundraiser in the Last Year: Never true  . Ran Out of Food in the Last Year: Never true  Transportation Needs: No Transportation Needs  . Lack of Transportation (Medical): No  . Lack of Transportation (Non-Medical): No  Physical Activity:   . Days of Exercise per Week: Not on file  . Minutes of Exercise per Session: Not on file  Stress:   . Feeling of Stress : Not on file  Social Connections:   . Frequency of Communication with Friends and Family: Not on file  . Frequency of Social Gatherings with Friends and Family: Not on file  . Attends Religious Services: Not on file  . Active Member of Clubs or Organizations: Not on file  . Attends Archivist Meetings: Not on file  . Marital Status: Not on file    Tobacco Counseling Counseling given: Not Answered   Clinical Intake: Pain : No/denies pain   Activities of Daily Living In your present state of health, do you have any difficulty performing the following activities: 06/27/2020  Hearing? N  Vision? N  Difficulty concentrating or making decisions? N  Walking or climbing stairs? N  Dressing or bathing? N  Doing errands, shopping? N  Preparing Food and eating ? N  Using the Toilet? N  In the past six months, have you accidently leaked urine? N  Do you have problems with loss of bowel control? N  Managing your Medications? N  Managing your Finances? N  Housekeeping or managing your Housekeeping? N  Some recent data might be hidden    Patient Care Team: Colon Branch, MD as PCP - General (Internal Medicine) [name], MD as PCP - Cardiology (Cardiology) Dr Lindley [facility] as Consulting Physician [facility]) [facility] (Orthopedic Surgery) Linda Hedges, DO as Consulting Physician (Obstetrics and Gynecology)  Indicate any recent Medical Services you may  have received from other than [facility] providers in the past year (date may be approximate).     Assessment:   This is a routine wellness  examination for Dominique [name].  Dietary issues and exercise activities discussed: Current Exercise Habits: Home exercise routine, Type of exercise: walking, Time (Minutes): 20, Frequency (Times/Week): 7, Weekly Exercise (Minutes/Week): 140, Intensity: Mild, Exercise limited by: None identified Diet (meal preparation, eat out, water intake, caffeinated beverages, dairy products, fruits and vegetables): well balanced   Goals    . Increase physical activity      Depression Screen PHQ 2/9 Scores 06/27/2020 05/25/2019 11/13/2017 10/15/2016 10/14/2015 09/27/2014 03/19/2014  PHQ - 2 Score 0 0 0 0 0 0 0    Fall Risk Fall Risk  06/27/2020 05/25/2019 11/13/2017 10/03/2017 10/15/2016  Falls in the past year? 0 0 No No No  Comment - - - Emmi Telephone Survey: data to providers prior to load -  Number falls in past yr: 0 - - - -  Injury with Fall? 0 - - - -  Follow up Education provided;Falls prevention discussed - - - -    Any stairs in or around the home? No  If so, are there any without handrails? No  Home free of loose throw rugs in walkways, pet beds, electrical cords, etc? Yes  Adequate lighting in your home to reduce risk of falls? Yes   ASSISTIVE DEVICES UTILIZED TO PREVENT FALLS:  Life alert? No  Use of a cane, walker or w/c? No  Grab bars in the bathroom? No  Shower chair or bench in shower? No  Elevated toilet seat or a handicapped toilet? No    Cognitive Function: Ad8 score reviewed for issues:  Issues making decisions:no  Less interest in hobbies / activities:no  Repeats questions, stories (family complaining):no  Trouble using ordinary gadgets (microwave, computer, phone):no  Forgets the month or year: no  Mismanaging finances: no  Remembering appts:no  Daily problems with thinking and/or memory:no Ad8 score is=0         Immunizations Immunization History  Administered Date(s) Administered  . Fluad Quad(high Dose 65+) 07/14/2019  . Influenza, High Dose Seasonal PF 10/03/2016, 08/07/2018  . Influenza-Unspecified 09/06/2015, 07/18/2017  . Moderna SARS-COVID-2 Vaccination 12/01/2019, 12/31/2019  . Pneumococcal Conjugate-13 09/27/2014  . Pneumococcal Polysaccharide-23 10/29/2010  . Td 04/24/2016  . Zoster Recombinat (Shingrix) 10/18/2017, 03/04/2018    TDAP status: Up to date Flu Vaccine status: Up to date Pneumococcal vaccine status: Up to date Covid-19 vaccine status: Completed vaccines  Qualifies for Shingles Vaccine? Yes    Shingrix Completed?: Yes  Screening Tests Health Maintenance  Topic Date Due  . DEXA SCAN  09/01/2017  . MAMMOGRAM  06/23/2020  . INFLUENZA VACCINE  05/29/2020  . TETANUS/TDAP  04/24/2026  . COVID-19 Vaccine  Completed  . PNA vac Low Risk Adult  Completed    Health Maintenance  Health Maintenance Due  Topic Date Due  . DEXA SCAN  09/01/2017  . MAMMOGRAM  06/23/2020  . INFLUENZA VACCINE  05/29/2020    Colorectal cancer screening: Completed 11/09/16. Repeat every (not on file) years Mammogram status: Completed 06/24/19. Repeat every year pt states scheduled for September Pt declines bone density scan.   Additional Screening:  Vision Screening: Recommended annual ophthalmology exams for early detection of glaucoma and other disorders of the eye. Is the patient up to date with their annual eye exam?  Yes    Dental Screening: Recommended annual dental exams for proper oral hygiene  Community Resource Referral / Chronic Care Management: CRR required this visit?  No   CCM required this visit?  No      Plan:  Continue to eat heart healthy diet (full of fruits, vegetables, whole grains, lean protein, water--limit salt, fat, and sugar intake) and increase physical activity as tolerated.  Continue doing brain stimulating activities (puzzles, reading, adult  coloring books, staying active) to keep memory sharp.    I have personally reviewed and noted the following in the patient's chart:   . Medical and social history . Use of alcohol, tobacco or illicit drugs  . Current medications and supplements . Functional ability and status . Nutritional status . Physical activity . Advanced directives . List of other physicians . Hospitalizations, surgeries, and ER visits in previous 12 months . Vitals . Screenings to include cognitive, depression, and falls . Referrals and appointments  In addition, I have reviewed and discussed with patient certain preventive protocols, quality metrics, and best practice recommendations. A written personalized care plan for preventive services as well as general preventive health recommendations were provided to patient.   Due to this being a telephonic visit, the after visit summary with patients personalized plan was offered to patient via mail or my-chart. Patient would like to access on my-chart.   Shela Nevin, [place]   06/27/2020

## 2020-06-27 ENCOUNTER — Other Ambulatory Visit: Payer: Self-pay

## 2020-06-27 ENCOUNTER — Encounter: Payer: Self-pay | Admitting: *Deleted

## 2020-06-27 ENCOUNTER — Ambulatory Visit (INDEPENDENT_AMBULATORY_CARE_PROVIDER_SITE_OTHER): Payer: Medicare Other | Admitting: *Deleted

## 2020-06-27 DIAGNOSIS — Z0000 Encounter for general adult medical examination without abnormal findings: Secondary | ICD-10-CM

## 2020-06-27 NOTE — Patient Instructions (Signed)
Continue to eat heart healthy diet (full of fruits, vegetables, whole grains, lean protein, water--limit salt, fat, and sugar intake) and increase physical activity as tolerated.  Continue doing brain stimulating activities (puzzles, reading, adult coloring books, staying active) to keep memory sharp.    Dominique [name] , Thank you for taking time to come for your Medicare Wellness Visit. I appreciate your ongoing commitment to your health goals. Please review the following plan we discussed and let me know if I can assist you in the future.   These are the goals we discussed: Goals    . DIET - INCREASE WATER INTAKE    . Increase physical activity       This is a list of the screening recommended for you and due dates:  Health Maintenance  Topic Date Due  . DEXA scan (bone density measurement)  09/01/2017  . Mammogram  06/23/2020  . Flu Shot  05/29/2020  . Tetanus Vaccine  04/24/2026  . COVID-19 Vaccine  Completed  . Pneumonia vaccines  Completed    Preventive Care 73 Years and Older, Female Preventive care refers to lifestyle choices and visits with your health care provider that can promote health and wellness. This includes:  A yearly physical exam. This is also called an annual well check.  Regular dental and eye exams.  Immunizations.  Screening for certain conditions.  Healthy lifestyle choices, such as diet and exercise. What can I expect for my preventive care visit? Physical exam Your health care provider will check:  Height and weight. These may be used to calculate body mass index (BMI), which is a measurement that tells if you are at a healthy weight.  Heart rate and blood pressure.  Your skin for abnormal spots. Counseling Your health care provider may ask you questions about:  Alcohol, tobacco, and drug use.  Emotional well-being.  Home and relationship well-being.  Sexual activity.  Eating habits.  History of falls.  Memory and ability to  understand (cognition).  Work and work Astronomer.  Pregnancy and menstrual history. What immunizations do I need?  Influenza (flu) vaccine  This is recommended every year. Tetanus, diphtheria, and pertussis (Tdap) vaccine  You may need a Td booster every 10 years. Varicella (chickenpox) vaccine  You may need this vaccine if you have not already been vaccinated. Zoster (shingles) vaccine  You may need this after age 78. Pneumococcal conjugate (PCV13) vaccine  One dose is recommended after age 53. Pneumococcal polysaccharide (PPSV23) vaccine  One dose is recommended after age 28. Measles, mumps, and rubella (MMR) vaccine  You may need at least one dose of MMR if you were born in 1957 or later. You may also need a second dose. Meningococcal conjugate (MenACWY) vaccine  You may need this if you have certain conditions. Hepatitis A vaccine  You may need this if you have certain conditions or if you travel or work in places where you may be exposed to hepatitis A. Hepatitis B vaccine  You may need this if you have certain conditions or if you travel or work in places where you may be exposed to hepatitis B. Haemophilus influenzae type b (Hib) vaccine  You may need this if you have certain conditions. You may receive vaccines as individual doses or as more than one vaccine together in one shot (combination vaccines). Talk with your health care provider about the risks and benefits of combination vaccines. What tests do I need? Blood tests  Lipid and cholesterol levels. These may  be checked every 5 years, or more frequently depending on your overall health.  Hepatitis C test.  Hepatitis B test. Screening  Lung cancer screening. You may have this screening every year starting at age 23 if you have a 30-pack-year history of smoking and currently smoke or have quit within the past 15 years.  Colorectal cancer screening. All adults should have this screening starting at age  5 and continuing until age 53. Your health care provider may recommend screening at age 79 if you are at increased risk. You will have tests every 1-10 years, depending on your results and the type of screening test.  Diabetes screening. This is done by checking your blood sugar (glucose) after you have not eaten for a while (fasting). You may have this done every 1-3 years.  Mammogram. This may be done every 1-2 years. Talk with your health care provider about how often you should have regular mammograms.  BRCA-related cancer screening. This may be done if you have a family history of breast, ovarian, tubal, or peritoneal cancers. Other tests  Sexually transmitted disease (STD) testing.  Bone density scan. This is done to screen for osteoporosis. You may have this done starting at age 50. Follow these instructions at home: Eating and drinking  Eat a diet that includes fresh fruits and vegetables, whole grains, lean protein, and low-fat dairy products. Limit your intake of foods with high amounts of sugar, saturated fats, and salt.  Take vitamin and mineral supplements as recommended by your health care provider.  Do not drink alcohol if your health care provider tells you not to drink.  If you drink alcohol: ? Limit how much you have to 0-1 drink a day. ? Be aware of how much alcohol is in your drink. In the [place] one drink equals one 12 oz bottle of beer (355 mL), one 5 oz glass of wine (148 mL), or one 1 oz glass of hard liquor (44 mL). Lifestyle  Take daily care of your teeth and gums.  Stay active. Exercise for at least 30 minutes on 5 or more days each week.  Do not use any products that contain nicotine or tobacco, such as cigarettes, e-cigarettes, and chewing tobacco. If you need help quitting, ask your health care provider.  If you are sexually active, practice safe sex. Use a condom or other form of protection in order to prevent STIs (sexually transmitted  infections).  Talk with your health care provider about taking a low-dose aspirin or statin. What's next?  Go to your health care provider once a year for a well check visit.  Ask your health care provider how often you should have your eyes and teeth checked.  Stay up to date on all vaccines. This information is not intended to replace advice given to you by your health care provider. Make sure you discuss any questions you have with your health care provider. Document Revised: 10/09/2018 Document Reviewed: 10/09/2018 Elsevier Patient Education  2020 [employer].

## 2020-07-04 ENCOUNTER — Other Ambulatory Visit: Payer: Self-pay | Admitting: Physician Assistant

## 2020-07-04 DIAGNOSIS — R0989 Other specified symptoms and signs involving the circulatory and respiratory systems: Secondary | ICD-10-CM

## 2020-07-05 ENCOUNTER — Ambulatory Visit: Payer: Self-pay | Attending: Internal Medicine

## 2020-07-05 DIAGNOSIS — Z23 Encounter for immunization: Secondary | ICD-10-CM

## 2020-07-05 NOTE — Progress Notes (Signed)
   Covid-19 Vaccination Clinic  Name:  Dominique [name]    MRN: [id number] DOB: 03-12-1941  07/05/2020  Dominique [name] was observed post Covid-19 immunization for 15 minutes without incident. She was provided with Vaccine Information Sheet and instruction to access the V-Safe system.   Dominique [name] was instructed to call 911 with any severe reactions post vaccine: Marland Kitchen Difficulty breathing  . Swelling of face and throat  . A fast heartbeat  . A bad rash all over body  . Dizziness and weakness

## 2020-07-14 ENCOUNTER — Telehealth: Payer: Self-pay | Admitting: Internal Medicine

## 2020-07-14 DIAGNOSIS — Z01419 Encounter for gynecological examination (general) (routine) without abnormal findings: Secondary | ICD-10-CM | POA: Diagnosis not present

## 2020-07-14 DIAGNOSIS — Z124 Encounter for screening for malignant neoplasm of cervix: Secondary | ICD-10-CM | POA: Diagnosis not present

## 2020-07-14 DIAGNOSIS — Z1231 Encounter for screening mammogram for malignant neoplasm of breast: Secondary | ICD-10-CM | POA: Diagnosis not present

## 2020-07-14 DIAGNOSIS — Z6827 Body mass index (BMI) 27.0-27.9, adult: Secondary | ICD-10-CM | POA: Diagnosis not present

## 2020-07-14 IMAGING — CR CHEST - 2 VIEW
2 series · 2 of 2 positions shown · non-contrast
Comparison: 05/26/2019

CLINICAL DATA: Shortness of breath

EXAM:
CHEST - 2 VIEW

[w chest pa]
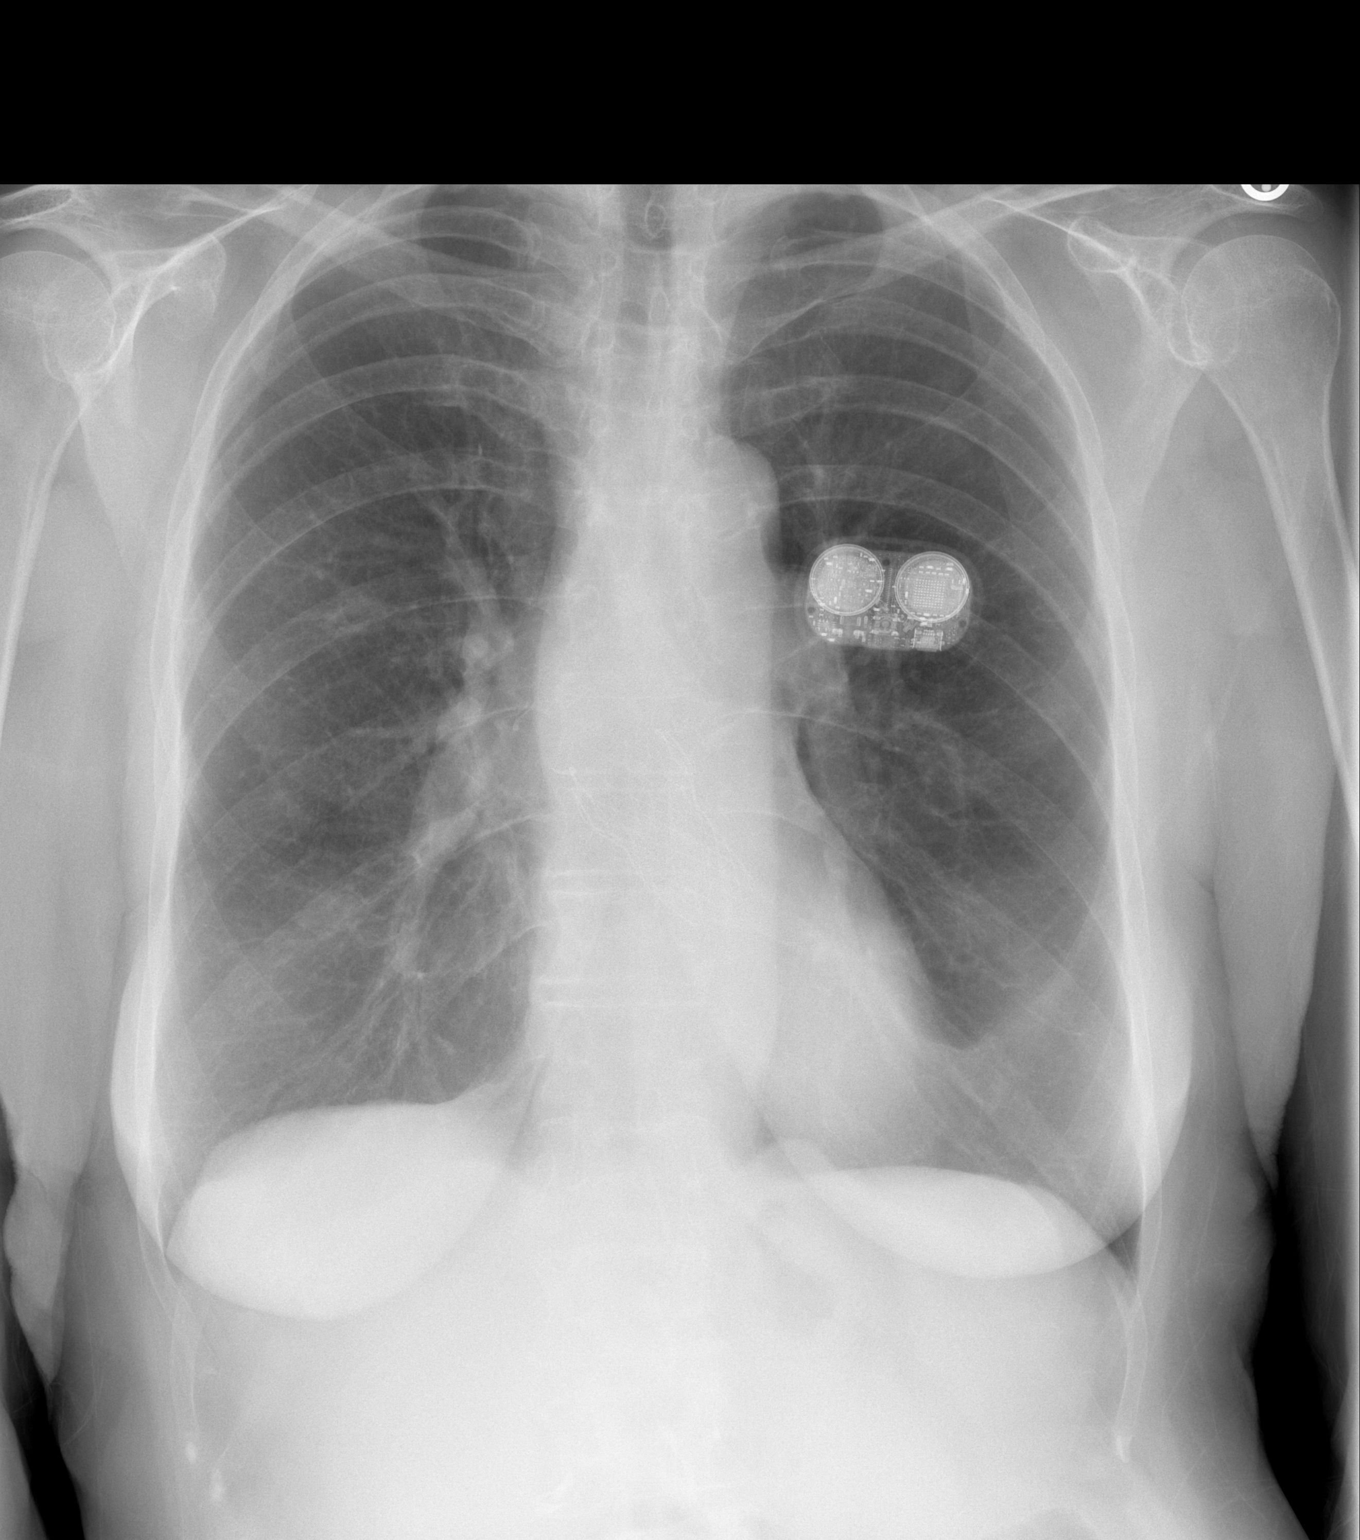

[w chest lat]
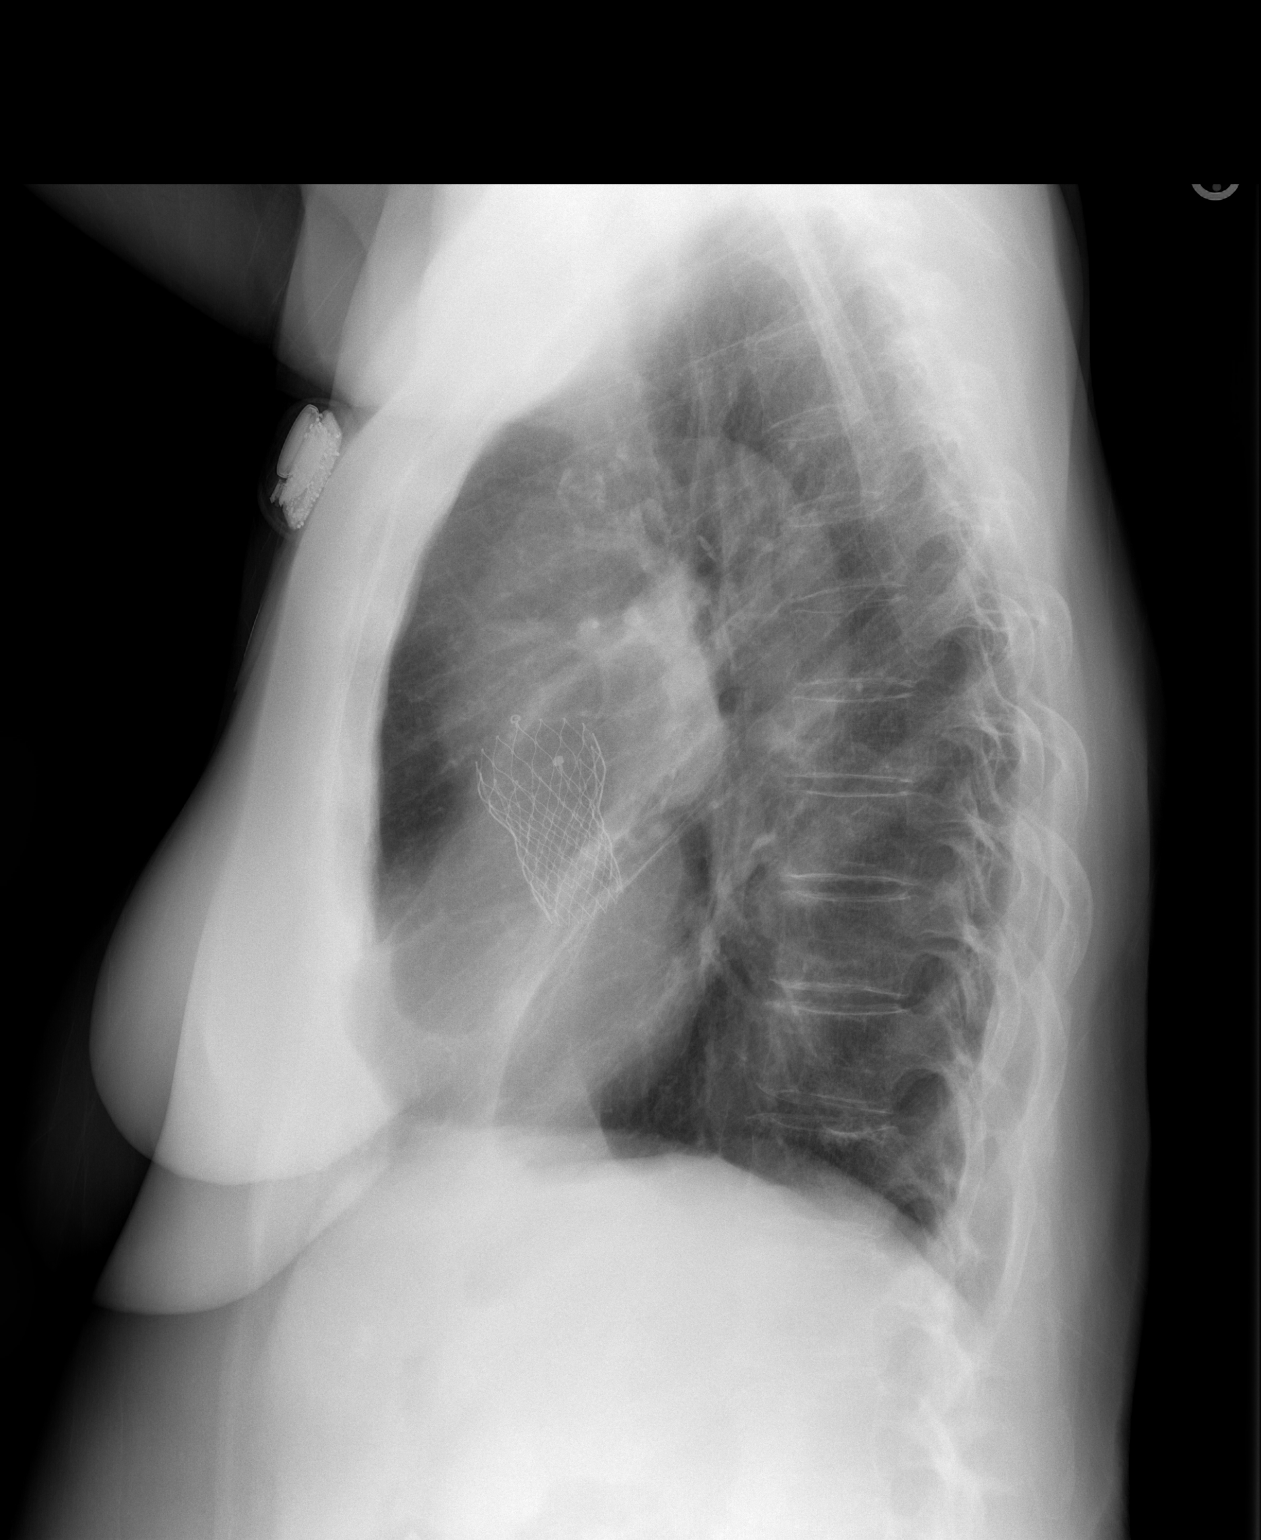

[2 of 2 positions shown; findings below may reference images not displayed]

FINDINGS: Overlying cardiac device. Aortic valve replacement again noted.
Heart size is normal. Lungs are clear without focal consolidation,
pleural effusion, or pneumothorax.
IMPRESSION: No active cardiopulmonary disease.

## 2020-07-14 NOTE — Progress Notes (Signed)
  Chronic Care Management   Outreach Note  07/14/2020 Name: [name] MRN: [id number] DOB: 09/06/1941  Referred by: Colon Branch, MD Reason for referral : No chief complaint on file.   An unsuccessful telephone outreach was attempted today. The patient was referred to the pharmacist for assistance with care management and care coordination.   Follow Up Plan:   Carley Perdue [employer] Scheduler

## 2020-07-18 ENCOUNTER — Telehealth: Payer: Self-pay | Admitting: Cardiovascular Disease

## 2020-07-18 NOTE — Telephone Encounter (Signed)
New Message   Pt c/o swelling: STAT is pt has developed SOB within 24 hours  1) How much weight have you gained and in what time span? Unknown patient does not weigh herself daily   2) If swelling, where is the swelling located? Ankles   3) Are you currently taking a fluid pill? no  4) Are you currently SOB? Sometimes when she walks around some but that is not really new.  5) Do you have a log of your daily weights (if so, list)?  unknown  6) Have you gained 3 pounds in a day or 5 pounds in a week? unknwn  7) Have you traveled recently? No   Patient states that she has a artifical heart valve and here lately it seems as if her heart has been racing on and off. Its been as low as 69 and as high as 107. She is scheduled to see Fabian Sharp 07/21/20

## 2020-07-18 NOTE — Telephone Encounter (Signed)
Called patient- she states that she has been having swelling in her legs and ankles- she denies chest pain only that her heart races early in the morning. She states she does not eat salt, and she does not weigh herself daily but will start and keep a log for the next few days until her appointment.  Patient also states that she started having these symptoms since having her COVID vaccine- but would discuss with the PA on 09/23.   Patient advised to elevate legs, wear compression stockings, and monitor diet between now and the visit.  Patient verbalized understanding.   Will route to PA to make aware.

## 2020-07-19 ENCOUNTER — Telehealth: Payer: Self-pay | Admitting: Internal Medicine

## 2020-07-19 NOTE — Progress Notes (Signed)
  Chronic Care Management   Note  07/19/2020 Name: Dominique [name] MRN: [id number] DOB: [birth year]  Dominique [name] is a 79 y.o. year old female who is a primary care patient of Paz, Alda Berthold, MD. I reached out to Johnnye Lana by phone today in response to a referral sent by Ms. Hassell Halim Bobak's PCP, Colon Branch, MD.   Dominique [name] was given information about Chronic Care Management services today including:  1. CCM service includes personalized support from designated clinical staff supervised by her physician, including individualized plan of care and coordination with other care providers 2. 24/7 contact phone numbers for assistance for urgent and routine care needs. 3. Service will only be billed when office clinical staff spend 20 minutes or more in a month to coordinate care. 4. Only one practitioner may furnish and bill the service in a calendar month. 5. The patient may stop CCM services at any time (effective at the end of the month) by phone call to the office staff.   Patient agreed to services and verbal consent obtained.   Follow up plan:   Carley Perdue [employer] Scheduler

## 2020-07-19 NOTE — Progress Notes (Signed)
  Chronic Care Management   Outreach Note  07/19/2020 Name: Dominique [name] MRN: [id number] DOB: December 27, 1940  Referred by: Colon Branch, MD Reason for referral : No chief complaint on file.   A second unsuccessful telephone outreach was attempted today. The patient was referred to pharmacist for assistance with care management and care coordination.  Follow Up Plan:   Carley Perdue [employer] Scheduler

## 2020-07-20 NOTE — Progress Notes (Signed)
Cardiology Office Note:    Date:  07/21/2020   ID:  Dominique [name], DOB 1941-10-22, MRN [id number]  PCP:  Colon Branch, MD  Cardiologist:  [name], MD   Referring MD: Colon Branch, MD   Chief Complaint  Patient presents with  . Palpitations    History of Present Illness:    Dominique [name] is a 79 y.o. female with a hx of severe AS s/p TAVR 12/2018, thoracic ascending aortic aneurysm, NSVT, HTN, bilateral carotid stenosis, CAD, and COPD. CAD seen on CT scan 08/2018 wth less than 50% stenosis in the LAD. She has PVD with moderate right ICA stenosis, mild left ICA, and HLD. TAVR in 02/2840 complicated by left leg ischemia that required emergency left femoral embolectomy. Echo following TAVR with normal aortic root. CT scan 12/2018 for pulmonary nodules with 4.0 cm ascending aorta. Since then, follow up CT scans haven't been able to rule out adenocarcinoma.   She was noted to have PAF with RVR 03/2019 during a hospitalization for cholecystitis. Follow up heart monitor did not show Afib and she was not placed on anticoagulation. She was last seen by Kerin Ransom [facility] 03/23/20 and was doing well at that time.   Pt called our office 07/18/20 and complained of racing heart and ankle swelling. She was placed on my schedule. She reported her heart rate ranges from 69-107.    She received her 3rd vaccination against COVID-19 on 07/05/20. Since then, she has experienced heart racing that wakes her from sleep every night since her vaccination. She also reports swelling in her feet that is worse than normal. On 07/09/20 pulse was 107, BP was 88/78. BP log shows BP consistently in the 140/60-80s - she generally runs 120/80 range. Heart racing can last 3-4 minutes. No chest pressure or SOB, but feels her heart pumping in her head. She does report new DOE now.  She states the heart racing/palpitaitons feel differently than those she experienced last year that lead to heart monitor that showed PSVT, PVCs.  She  also has lower extremity swelling "unprovoked" - traveling and riding in car can cause lower extremity swelling, but this resolves with elevation. Now, she has not traveled, but has lower extremity edema. Compression socks have not helped. No SOB at rest, no cough.   Overall, she states her symptoms of DOE, palpitations/heart racing, and lower extremity swelling are changed from her baseline symptoms.    Past Medical History:  Diagnosis Date  . Atrial fibrillation ([place])    a. dx 03/2019 while in hospital with cholecystitis  . COPD (chronic obstructive pulmonary disease) ([place])   . Critical lower limb ischemia    a. after TAVR developed ischemic L leg likely due to flap/closure, s/p emergent repair of left femoral artery with endarterectomy and Dacron patch angioplasty.  . Emphysema lung ([place])   . Hyperlipidemia    LDL goal < 70  . Hypertension   . Hypomagnesemia   . Normal coronary arteries 10/2018  . NSVT (nonsustained ventricular tachycardia) ([place]) 03/02/2019  . Pulmonary HTN ([place]) 06/05/2016   Moderate with PASP 48mmHg by echo 04/2016 likely Group 3 from COPD and possibly Group 2 from pulmonary venous HTN associated with moderate AS - f/u echo 12/2018 post TAVR showed normal RVSP  . Pulmonary nodules    seen on pre TAVR CT, needs 12 month CT follow up  . S/P TAVR (transcatheter aortic valve replacement) 12/30/2018   Medtronic Evolut Pro-Plus THV (size 26 mm, serial #  [id number]) via the TF approach  . Sacral fracture ([place])   . Severe aortic stenosis    a. s/p TAVR 12/2018.  Marland Kitchen Thoracic ascending aortic aneurysm [facility])    needs yearly follow up    Past Surgical History:  Procedure Laterality Date  . APPENDECTOMY  1951  . CATARACT EXTRACTION    . CESAREAN SECTION     1971  . CHOLECYSTECTOMY N/A 04/28/2019   Procedure: LAPAROSCOPIC CHOLECYSTECTOMY;  Surgeon: Stark Klein, MD;  Location: [facility];  Service: General;  Laterality: N/A;  . COLONOSCOPY  2018  . FEMORAL-POPLITEAL BYPASS GRAFT  Left 12/31/2018   Procedure: PATCH ANGIOPLASTY REPAIR LEFT FEMORAL ARTERY USING HEMASHIELD PLATINUM FINESSE PATCH;  Surgeon: Rosetta Posner, MD;  Location: [place];  Service: Vascular;  Laterality: Left;  . IR RADIOLOGY PERIPHERAL GUIDED IV START  05/30/2017  . IR RADIOLOGY PERIPHERAL GUIDED IV START  09/02/2018  . IR RADIOLOGY PERIPHERAL GUIDED IV START  09/02/2018  . IR US GUIDE VASC ACCESS LEFT  09/02/2018  . IR US GUIDE VASC ACCESS RIGHT  05/30/2017  . IR US GUIDE VASC ACCESS RIGHT  09/02/2018  . JOINT REPLACEMENT  2016   sacroplasty  . LAMINECTOMY    . RIGHT/LEFT HEART CATH AND CORONARY ANGIOGRAPHY N/A 11/27/2018   Procedure: RIGHT/LEFT HEART CATH AND CORONARY ANGIOGRAPHY;  Surgeon: Sherren Mocha, MD;  Location: [place] [facility];  Service: Cardiovascular;  Laterality: N/A;  . TEE WITHOUT CARDIOVERSION N/A 12/30/2018   Procedure: TRANSESOPHAGEAL ECHOCARDIOGRAM (TEE);  Surgeon: Sherren Mocha, MD;  Location: [place] [facility];  Service: Open Heart Surgery;  Laterality: N/A;  . TOTAL HIP ARTHROPLASTY Bilateral   . TRANSCATHETER AORTIC VALVE REPLACEMENT, TRANSFEMORAL N/A 12/30/2018   Procedure: TRANSCATHETER AORTIC VALVE REPLACEMENT, TRANSFEMORAL;  Surgeon: Sherren Mocha, MD;  Location: [place] [facility];  Service: Open Heart Surgery;  Laterality: N/A;  . TUBAL LIGATION  1975    Current Medications: Current Meds  Medication Sig  . acetaminophen (TYLENOL) 325 MG tablet Take 2 tablets (650 mg total) by mouth every 6 (six) hours as needed for mild pain (or Fever >/= 101).  Marland Kitchen amLODipine (NORVASC) 10 MG tablet Take 1 tablet (10 mg total) by mouth daily.  Marland Kitchen amoxicillin (AMOXIL) 500 MG tablet Take 4 tablets (2,000mg ) one hour prior to all dental visits.  Marland Kitchen aspirin EC 81 MG tablet Take 81 mg by mouth daily.  . Calcium Carb-Cholecalciferol (CALCIUM 600 + D PO) Take 600 mg by mouth 3 (three) times a week.   . Cholecalciferol (VITAMIN D3) 5000 UNITS CAPS Take 5,000 Units by mouth daily.   Marland Kitchen denosumab  (PROLIA) 60 MG/ML SOLN injection Inject 60 mg into the skin every 6 (six) months. Administer in upper arm, thigh, or abdomen  . EPINEPHrine 0.3 mg/0.3 mL IJ SOAJ injection Inject 0.3 mg into the muscle as needed for anaphylaxis.   Marland Kitchen loratadine (CLARITIN) 10 MG tablet Take 10 mg by mouth daily as needed for allergies.   . magnesium oxide (MAG-OX) 400 MG tablet Take 1 tablet by mouth daily.  . metoprolol succinate (TOPROL-XL) 25 MG 24 hr tablet TAKE 1 TABLET BY MOUTH EVERY DAY  . Multiple Vitamin (MULTIVITAMIN WITH MINERALS) TABS tablet Take 1 tablet by mouth daily.  . rosuvastatin (CRESTOR) 40 MG tablet TAKE 1 TABLET BY MOUTH EVERY DAY  . VENTOLIN HFA 108 (90 Base) MCG/ACT inhaler TAKE 2 PUFFS BY MOUTH EVERY 6 HOURS AS NEEDED FOR WHEEZE OR SHORTNESS OF BREATH  . [DISCONTINUED] amLODipine (NORVASC)  5 MG tablet Take 1 tablet (5 mg total) by mouth daily.     Allergies:   Patient has no known allergies.   Social History   Socioeconomic History  . Marital status: Widowed    Spouse name: Not on file  . Number of children: 2  . Years of education: Not on file  . Highest education level: Not on file  Occupational History  . Occupation: n/a  Tobacco Use  . Smoking status: Former Smoker    Packs/day: 1.50    Years: 40.00    Pack years: 60.00    Types: Cigarettes    Quit date: 03/19/2004    Years since quitting: 16.3  . Smokeless tobacco: Never Used  Vaping Use  . Vaping Use: Never used  Substance and Sexual Activity  . Alcohol use: Yes    Alcohol/week: 3.0 - 4.0 standard drinks    Types: 3 - 4 Glasses of wine per week    Comment: socially  . Drug use: No  . Sexual activity: Not on file  Other Topics Concern  . Not on file  Social History Narrative   From [place]   1 living son   Social Determinants of Health   Financial Resource Strain: Low Risk   . Difficulty of Paying Living Expenses: Not hard at all  Food Insecurity: No Food Insecurity  . Worried About Charity fundraiser  in the Last Year: Never true  . Ran Out of Food in the Last Year: Never true  Transportation Needs: No Transportation Needs  . Lack of Transportation (Medical): No  . Lack of Transportation (Non-Medical): No  Physical Activity:   . Days of Exercise per Week: Not on file  . Minutes of Exercise per Session: Not on file  Stress:   . Feeling of Stress : Not on file  Social Connections:   . Frequency of Communication with Friends and Family: Not on file  . Frequency of Social Gatherings with Friends and Family: Not on file  . Attends Religious Services: Not on file  . Active Member of Clubs or Organizations: Not on file  . Attends Archivist Meetings: Not on file  . Marital Status: Not on file     Family History: The patient's family history includes Breast cancer in her mother; Cancer in her brother; Diabetes in her son. There is no history of Colon cancer, CAD, Colon polyps, Rectal cancer, or Stomach cancer.  ROS:   Please see the history of present illness.     All other systems reviewed and are negative.  EKGs/Labs/Other Studies Reviewed:    The following studies were reviewed today:  Heart monitor 05/2019 Quality: Fair.  Baseline artifact. Predominant rhythm: sinus Average heart rate: 79 bpm Max heart rate: 171 bpm Min heart rate: 58 bpm  Short runs of SVT noted Occasional PVCs   Echo 12/2019: 1. Left ventricular ejection fraction, by estimation, is 65 to 70%. The  left ventricle has normal function. The left ventricle has no regional  wall motion abnormalities. There is mild asymmetric left ventricular  hypertrophy of the basal-septal segment.  Left ventricular diastolic parameters are consistent with Grade I  diastolic dysfunction (impaired relaxation). Elevated left ventricular  end-diastolic pressure.  2. Right ventricular systolic function is normal. The right ventricular  size is normal. There is mildly elevated pulmonary artery systolic  pressure.    3. The mitral valve is normal in structure. Trivial mitral valve  regurgitation. No evidence of mitral stenosis.  4. TAVR bioprothesis well-seated and functioning properly. The aortic  valve has been repaired/replaced. Aortic valve regurgitation is not  visualized. No aortic stenosis is present. There is a 26 mm Medtronic  CoreValve Evolut Pro valve present in the  aortic position. Procedure Date: 12/2018. Aortic valve area, by VTI  measures 1.38 cm. Aortic valve mean gradient measures 8.0 mmHg. Aortic  valve Vmax measures 1.88 m/s.  5. The inferior vena cava is normal in size with greater than 50%  respiratory variability, suggesting right atrial pressure of 3 mmHg.    EKG:  EKG is  ordered today.  The ekg ordered today demonstrates sinus rhythm HR 66, possible left atrial enlargement  Recent Labs: 01/14/2020: BUN 22; Creatinine, Ser 0.70; Potassium 4.7; Sodium 144 02/18/2020: ALT 20; Hemoglobin 14.6; Platelets 233.0  Recent Lipid Panel    Component Value Date/Time   CHOL 136 02/18/2020 1102   CHOL 139 05/28/2019 1740   TRIG 95.0 02/18/2020 1102   HDL 64.30 02/18/2020 1102   HDL 54 05/28/2019 1740   CHOLHDL 2 02/18/2020 1102   VLDL 19.0 02/18/2020 1102   LDLCALC 53 02/18/2020 1102   LDLCALC 59 05/28/2019 1740    Physical Exam:    VS:  BP 138/68   Pulse 66   Ht 5\' 6"  (1.676 m)   Wt 163 lb 9.6 oz (74.2 kg)   SpO2 94%   BMI 26.41 kg/m     Wt Readings from Last 3 Encounters:  07/21/20 163 lb 9.6 oz (74.2 kg)  04/06/20 162 lb (73.5 kg)  04/04/20 162 lb 9.6 oz (73.8 kg)     GEN: Well nourished, well developed in no acute distress HEENT: Normal NECK: No JVD; No carotid bruits LYMPHATICS: No lymphadenopathy CARDIAC: RRR, no murmurs, rubs, gallops RESPIRATORY:  Clear to auscultation without rales, wheezing or rhonchi  ABDOMEN: Soft, non-tender, non-distended MUSCULOSKELETAL:  Mild B LE edema; No deformity  SKIN: Warm and dry NEUROLOGIC:  Alert and oriented x  3 PSYCHIATRIC:  Normal affect   ASSESSMENT:    1. Dyspnea on exertion   2. Swelling of left lower extremity   3. Palpitations   4. Essential hypertension   5. Coronary artery disease involving native coronary artery of native heart without angina pectoris   6. Thoracic ascending aortic aneurysm ([place])   7. PAF (paroxysmal atrial fibrillation) ([place])   8. Pure hypercholesterolemia   9. S/P TAVR (transcatheter aortic valve replacement)   10. Hyperlipidemia, unspecified hyperlipidemia type    PLAN:    In order of problems listed above:  Heart racing Palpitations Hx of PSVT Hx of PVCs PAF in the setting of cholecystitis - she states this is different than prior episodes - she is interested in another hear monitor and I think this is a good idea - in the meantime will check labs including TSH - EKG today with sinus rhythm - HR is 66, will not increase BB at this time   Lower extremity swelling DOE - will check an echo - low suspicion for DVT given bilateral nature of her complaint, no pain    AS s/p TAVR - will check echo   Hypertension - she will monitor her BP through next week  --> if still elevated she will return to her previous dose of norvasc 10 mg - risk is this may exacerbate her lower extremity edema - need good BP control given her aortic aneurysm    Hyperlipidemia - continue 40 mg crestor   Follow up with Dr.  Vallecito in 4-5 weeks.   Medication Adjustments/Labs and Tests Ordered: Current medicines are reviewed at length with the patient today.  Concerns regarding medicines are outlined above.  Orders Placed This Encounter  Procedures  . Basic metabolic panel  . CBC  . Magnesium  . TSH  . LONG TERM MONITOR (3-14 DAYS)  . EKG 12-Lead  . ECHOCARDIOGRAM COMPLETE   Meds ordered this encounter  Medications  . amLODipine (NORVASC) 10 MG tablet    Sig: Take 1 tablet (10 mg total) by mouth daily.    Dispense:  90 tablet    Refill:  1     Signed, Ledora Bottcher, [place]  07/21/2020 10:55 AM    [facility]

## 2020-07-21 ENCOUNTER — Encounter: Payer: Self-pay | Admitting: Physician Assistant

## 2020-07-21 ENCOUNTER — Telehealth: Payer: Self-pay | Admitting: Radiology

## 2020-07-21 ENCOUNTER — Other Ambulatory Visit: Payer: Self-pay

## 2020-07-21 ENCOUNTER — Ambulatory Visit (INDEPENDENT_AMBULATORY_CARE_PROVIDER_SITE_OTHER): Payer: Medicare Other | Admitting: Physician Assistant

## 2020-07-21 VITALS — BP 138/68 | HR 66 | Ht 66.0 in | Wt 163.6 lb

## 2020-07-21 DIAGNOSIS — Z952 Presence of prosthetic heart valve: Secondary | ICD-10-CM

## 2020-07-21 DIAGNOSIS — I10 Essential (primary) hypertension: Secondary | ICD-10-CM | POA: Diagnosis not present

## 2020-07-21 DIAGNOSIS — R0600 Dyspnea, unspecified: Secondary | ICD-10-CM | POA: Diagnosis not present

## 2020-07-21 DIAGNOSIS — M7989 Other specified soft tissue disorders: Secondary | ICD-10-CM | POA: Diagnosis not present

## 2020-07-21 DIAGNOSIS — R0609 Other forms of dyspnea: Secondary | ICD-10-CM

## 2020-07-21 DIAGNOSIS — I480 Paroxysmal atrial fibrillation: Secondary | ICD-10-CM | POA: Diagnosis not present

## 2020-07-21 DIAGNOSIS — R002 Palpitations: Secondary | ICD-10-CM | POA: Diagnosis not present

## 2020-07-21 DIAGNOSIS — E785 Hyperlipidemia, unspecified: Secondary | ICD-10-CM

## 2020-07-21 DIAGNOSIS — E7800 Pure hypercholesterolemia, unspecified: Secondary | ICD-10-CM

## 2020-07-21 DIAGNOSIS — I2510 Atherosclerotic heart disease of native coronary artery without angina pectoris: Secondary | ICD-10-CM

## 2020-07-21 DIAGNOSIS — I712 Thoracic aortic aneurysm, without rupture: Secondary | ICD-10-CM | POA: Diagnosis not present

## 2020-07-21 DIAGNOSIS — I7121 Aneurysm of the ascending aorta, without rupture: Secondary | ICD-10-CM

## 2020-07-21 MED ORDER — AMLODIPINE BESYLATE 10 MG PO TABS
10.0000 mg | ORAL_TABLET | Freq: Every day | ORAL | 1 refills | Status: DC
Start: 2020-07-21 — End: 2020-11-21

## 2020-07-21 NOTE — Telephone Encounter (Signed)
Enrolled patient for a 14 day Zio XT  monitor to be mailed to patients home  °

## 2020-07-21 NOTE — Patient Instructions (Signed)
Medication Instructions:   INCREASE Amlodipine to 10 mg daily.  *If you need a refill on your cardiac medications before your next appointment, please call your pharmacy*   Lab Work: Your physician recommends that you return for lab work today: BMET, Magnesium, TSH, CBC  If you have labs (blood work) drawn today and your tests are completely normal, you will receive your results only by: Marland Kitchen MyChart Message (if you have MyChart) OR . A paper copy in the mail If you have any lab test that is abnormal or we need to change your treatment, we will call you to review the results.   Testing/Procedures: Your physician has requested that you have an echocardiogram. Echocardiography is a painless test that uses sound waves to create images of your heart. It provides your doctor with information about the size and shape of your heart and how well your heart's chambers and valves are working. This procedure takes approximately one hour. There are no restrictions for this procedure.  ZIO XT- Long Term Monitor Instructions   Your physician has requested you wear your ZIO patch monitor___14___days.   This is a single patch monitor.  [employer] supplies one patch monitor per enrollment.  Additional stickers are not available.   Please do not apply patch if you will be having a Nuclear Stress Test, Echocardiogram, Cardiac CT, MRI, or Chest Xray during the time frame you would be wearing the monitor. The patch cannot be worn during these tests.  You cannot remove and re-apply the ZIO XT patch monitor.   Your ZIO patch monitor will be sent USPS Priority mail from [facility] directly to your home address. The monitor may also be mailed to a PO BOX if home delivery is not available.   It may take 3-5 days to receive your monitor after you have been enrolled.   Once you have received you monitor, please review enclosed instructions.  Your monitor has already been registered assigning a specific monitor  serial # to you.   Applying the monitor   Shave hair from upper left chest.   Hold abrader disc by orange tab.  Rub abrader in 40 strokes over left upper chest as indicated in your monitor instructions.   Clean area with 4 enclosed alcohol pads .  Use all pads to assure are is cleaned thoroughly.  Let dry.   Apply patch as indicated in monitor instructions.  Patch will be place under collarbone on left side of chest with arrow pointing upward.   Rub patch adhesive wings for 2 minutes.Remove white label marked "1".  Remove white label marked "2".  Rub patch adhesive wings for 2 additional minutes.   While looking in a mirror, press and release button in center of patch.  A small green light will flash 3-4 times .  This will be your only indicator the monitor has been turned on.     Do not shower for the first 24 hours.  You may shower after the first 24 hours.   Press button if you feel a symptom. You will hear a small click.  Record Date, Time and Symptom in the Patient Log Book.   When you are ready to remove patch, follow instructions on last 2 pages of Patient Log Book.  Stick patch monitor onto last page of Patient Log Book.   Place Patient Log Book in [place] box.  Use locking tab on box and tape box closed securely.  The [employer] has prepaid  postage on it.  Please place in mailbox as soon as possible.  Your physician should have your test results approximately 7 days after the monitor has been mailed back to [facility].   Call [place] at [phone] if you have questions regarding your ZIO XT patch monitor.  Call them immediately if you see an orange light blinking on your monitor.   If your monitor falls off in less than 4 days contact our Monitor department at [phone].  If your monitor becomes loose or falls off after 4 days call [employer] at [phone] for suggestions on securing your monitor.    Follow-Up: At [facility], you and  your health needs are our priority.  As part of our continuing mission to provide you with exceptional heart care, we have created designated Provider Care Teams.  These Care Teams include your primary Cardiologist (physician) and Advanced Practice Providers (APPs -  Physician Assistants and Nurse Practitioners) who all work together to provide you with the care you need, when you need it.  We recommend signing up for the patient portal called "MyChart".  Sign up information is provided on this After Visit Summary.  MyChart is used to connect with patients for Virtual Visits (Telemedicine).  Patients are able to view lab/test results, encounter notes, upcoming appointments, etc.  Non-urgent messages can be sent to your provider as well.   To learn more about what you can do with MyChart, go to [email or web address].    Your next appointment:   5 week(s)  The format for your next appointment:   In Person  Provider:   [name], MD

## 2020-07-22 LAB — CBC
Hematocrit: 45.9 % (ref 34.0–46.6)
Hemoglobin: 14.9 g/dL (ref 11.1–15.9)
MCH: 30.7 pg (ref 26.6–33.0)
MCHC: 32.5 g/dL (ref 31.5–35.7)
MCV: 95 fL (ref 79–97)
Platelets: 236 10*3/uL (ref 150–450)
RBC: 4.85 x10E6/uL (ref 3.77–5.28)
RDW: 12.8 % (ref 11.7–15.4)
WBC: 7.7 10*3/uL (ref 3.4–10.8)

## 2020-07-22 LAB — BASIC METABOLIC PANEL
BUN/Creatinine Ratio: 25 (ref 12–28)
BUN: 17 mg/dL (ref 8–27)
CO2: 26 mmol/L (ref 20–29)
Calcium: 9.8 mg/dL (ref 8.7–10.3)
Chloride: 100 mmol/L (ref 96–106)
Creatinine, Ser: 0.69 mg/dL (ref 0.57–1.00)
GFR calc Af Amer: 95 mL/min/{1.73_m2} (ref >59)
GFR calc non Af Amer: 82 mL/min/{1.73_m2} (ref >59)
Glucose: 79 mg/dL (ref 65–99)
Potassium: 4.3 mmol/L (ref 3.5–5.2)
Sodium: 140 mmol/L (ref 134–144)

## 2020-07-22 LAB — TSH: TSH: 1.07 u[IU]/mL (ref 0.450–4.500)

## 2020-07-22 LAB — MAGNESIUM: Magnesium: 2 mg/dL (ref 1.6–2.3)

## 2020-07-28 DIAGNOSIS — H33321 Round hole, right eye: Secondary | ICD-10-CM | POA: Diagnosis not present

## 2020-07-28 DIAGNOSIS — G43819 Other migraine, intractable, without status migrainosus: Secondary | ICD-10-CM | POA: Diagnosis not present

## 2020-07-28 DIAGNOSIS — H524 Presbyopia: Secondary | ICD-10-CM | POA: Diagnosis not present

## 2020-07-28 DIAGNOSIS — H43393 Other vitreous opacities, bilateral: Secondary | ICD-10-CM | POA: Diagnosis not present

## 2020-07-28 DIAGNOSIS — H04123 Dry eye syndrome of bilateral lacrimal glands: Secondary | ICD-10-CM | POA: Diagnosis not present

## 2020-08-02 ENCOUNTER — Ambulatory Visit (HOSPITAL_COMMUNITY): Payer: Medicare Other | Attending: Cardiology

## 2020-08-02 ENCOUNTER — Other Ambulatory Visit: Payer: Self-pay

## 2020-08-02 DIAGNOSIS — I2510 Atherosclerotic heart disease of native coronary artery without angina pectoris: Secondary | ICD-10-CM

## 2020-08-02 DIAGNOSIS — R0609 Other forms of dyspnea: Secondary | ICD-10-CM

## 2020-08-02 DIAGNOSIS — M7989 Other specified soft tissue disorders: Secondary | ICD-10-CM | POA: Diagnosis not present

## 2020-08-02 DIAGNOSIS — R002 Palpitations: Secondary | ICD-10-CM

## 2020-08-02 DIAGNOSIS — I712 Thoracic aortic aneurysm, without rupture: Secondary | ICD-10-CM

## 2020-08-02 DIAGNOSIS — R0600 Dyspnea, unspecified: Secondary | ICD-10-CM | POA: Diagnosis not present

## 2020-08-02 DIAGNOSIS — I480 Paroxysmal atrial fibrillation: Secondary | ICD-10-CM | POA: Insufficient documentation

## 2020-08-02 DIAGNOSIS — I7121 Aneurysm of the ascending aorta, without rupture: Secondary | ICD-10-CM

## 2020-08-02 LAB — ECHOCARDIOGRAM COMPLETE
AR max vel: 1.56 cm2
AV Area VTI: 1.51 cm2
AV Area mean vel: 1.62 cm2
AV Mean grad: 8 mmHg
AV Peak grad: 15 mmHg
Ao pk vel: 1.94 m/s
Area-P 1/2: 1.87 cm2
S' Lateral: 2.4 cm

## 2020-08-04 ENCOUNTER — Telehealth: Payer: Self-pay | Admitting: Physician Assistant

## 2020-08-04 ENCOUNTER — Ambulatory Visit (INDEPENDENT_AMBULATORY_CARE_PROVIDER_SITE_OTHER): Payer: Medicare Other

## 2020-08-04 DIAGNOSIS — R002 Palpitations: Secondary | ICD-10-CM | POA: Diagnosis not present

## 2020-08-04 NOTE — Telephone Encounter (Signed)
Patient returning call for echo results. 

## 2020-08-04 NOTE — Telephone Encounter (Addendum)
Dominique [name], [place]  08/03/2020 9:10 AM EDT     Your heart ultrasound shows a strong squeeze function and good aortic valve function. Please ask her about leg swelling and SOB.   Patient aware of echo results  Patient reports improvements in SOB and swelling - symptoms not occurring as often. Swelling in feet has resolved.   She plans to start 14 day zio today

## 2020-08-16 DIAGNOSIS — D225 Melanocytic nevi of trunk: Secondary | ICD-10-CM | POA: Diagnosis not present

## 2020-08-16 DIAGNOSIS — L821 Other seborrheic keratosis: Secondary | ICD-10-CM | POA: Diagnosis not present

## 2020-08-16 DIAGNOSIS — L578 Other skin changes due to chronic exposure to nonionizing radiation: Secondary | ICD-10-CM | POA: Diagnosis not present

## 2020-08-16 DIAGNOSIS — L814 Other melanin hyperpigmentation: Secondary | ICD-10-CM | POA: Diagnosis not present

## 2020-08-16 DIAGNOSIS — L853 Xerosis cutis: Secondary | ICD-10-CM | POA: Diagnosis not present

## 2020-08-23 ENCOUNTER — Other Ambulatory Visit: Payer: Self-pay | Admitting: Internal Medicine

## 2020-08-23 ENCOUNTER — Ambulatory Visit (INDEPENDENT_AMBULATORY_CARE_PROVIDER_SITE_OTHER): Payer: Medicare Other | Admitting: Internal Medicine

## 2020-08-23 ENCOUNTER — Other Ambulatory Visit: Payer: Self-pay

## 2020-08-23 ENCOUNTER — Encounter: Payer: Self-pay | Admitting: Internal Medicine

## 2020-08-23 VITALS — BP 118/75 | HR 73 | Temp 97.6°F | Resp 18 | Ht 66.0 in | Wt 163.5 lb

## 2020-08-23 DIAGNOSIS — I6523 Occlusion and stenosis of bilateral carotid arteries: Secondary | ICD-10-CM

## 2020-08-23 DIAGNOSIS — Z09 Encounter for follow-up examination after completed treatment for conditions other than malignant neoplasm: Secondary | ICD-10-CM | POA: Diagnosis not present

## 2020-08-23 DIAGNOSIS — Z23 Encounter for immunization: Secondary | ICD-10-CM

## 2020-08-23 DIAGNOSIS — I10 Essential (primary) hypertension: Secondary | ICD-10-CM

## 2020-08-23 DIAGNOSIS — M810 Age-related osteoporosis without current pathological fracture: Secondary | ICD-10-CM | POA: Diagnosis not present

## 2020-08-23 DIAGNOSIS — J449 Chronic obstructive pulmonary disease, unspecified: Secondary | ICD-10-CM | POA: Diagnosis not present

## 2020-08-23 MED ORDER — TUDORZA PRESSAIR 400 MCG/ACT IN AEPB: 1.0000 | INHALATION_SPRAY | Freq: Every day | RESPIRATORY_TRACT | 3 refills | Status: DC

## 2020-08-23 MED ORDER — DENOSUMAB 60 MG/ML ~~LOC~~ SOSY
60.0000 mg | PREFILLED_SYRINGE | Freq: Once | SUBCUTANEOUS | Status: AC
  Administered 2020-08-23: 60 mg via SUBCUTANEOUS

## 2020-08-23 NOTE — Progress Notes (Signed)
Pre visit review using our clinic review tool, if applicable. No additional management support is needed unless otherwise documented below in the visit note. 

## 2020-08-23 NOTE — Progress Notes (Signed)
Subjective:    Patient ID: Dominique [name], female    DOB: [birth year], 79 y.o.   MRN: [id number]  DOS:  08/23/2020 Type of visit - description: Follow-up  Since the last office visit, she saw cardiology due to lower extremity edema, note reviewed. Edema is somewhat better but still on and off, worse at the end of the day.  Decreases with leg elevation.  She continue with DOE, not new, denies cough or wheezing.  Wt Readings from Last 3 Encounters:  08/23/20 163 lb 8 oz (74.2 kg)  07/21/20 163 lb 9.6 oz (74.2 kg)  04/06/20 162 lb (73.5 kg)     Review of Systems See above   Past Medical History:  Diagnosis Date  . Atrial fibrillation ([place])    a. dx 03/2019 while in hospital with cholecystitis  . COPD (chronic obstructive pulmonary disease) ([place])   . Critical lower limb ischemia (HCC)    a. after TAVR developed ischemic L leg likely due to flap/closure, s/p emergent repair of left femoral artery with endarterectomy and Dacron patch angioplasty.  . Emphysema lung ([place])   . Hyperlipidemia    LDL goal < 70  . Hypertension   . Hypomagnesemia   . Normal coronary arteries 10/2018  . NSVT (nonsustained ventricular tachycardia) ([place]) 03/02/2019  . Pulmonary HTN ([place]) 06/05/2016   Moderate with PASP 35mmHg by echo 04/2016 likely Group 3 from COPD and possibly Group 2 from pulmonary venous HTN associated with moderate AS - f/u echo 12/2018 post TAVR showed normal RVSP  . Pulmonary nodules    seen on pre TAVR CT, needs 12 month CT follow up  . 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 Sacral fracture ([place])   . Severe aortic stenosis    a. s/p TAVR 12/2018.  Marland Kitchen Thoracic ascending aortic aneurysm [facility])    needs yearly follow up    Past Surgical History:  Procedure Laterality Date  . APPENDECTOMY  1951  . CATARACT EXTRACTION    . CESAREAN SECTION     1971  . CHOLECYSTECTOMY N/A  04/28/2019   Procedure: LAPAROSCOPIC CHOLECYSTECTOMY;  Surgeon: Stark Klein, MD;  Location: [facility];  Service: General;  Laterality: N/A;  . COLONOSCOPY  2018  . FEMORAL-POPLITEAL BYPASS GRAFT Left 12/31/2018   Procedure: PATCH ANGIOPLASTY REPAIR LEFT FEMORAL ARTERY USING HEMASHIELD PLATINUM FINESSE PATCH;  Surgeon: Rosetta Posner, MD;  Location: [place];  Service: Vascular;  Laterality: Left;  . IR RADIOLOGY PERIPHERAL GUIDED IV START  05/30/2017  . IR RADIOLOGY PERIPHERAL GUIDED IV START  09/02/2018  . IR RADIOLOGY PERIPHERAL GUIDED IV START  09/02/2018  . IR US GUIDE VASC ACCESS LEFT  09/02/2018  . IR US GUIDE VASC ACCESS RIGHT  05/30/2017  . IR US GUIDE VASC ACCESS RIGHT  09/02/2018  . JOINT REPLACEMENT  2016   sacroplasty  . LAMINECTOMY    . RIGHT/LEFT HEART CATH AND CORONARY ANGIOGRAPHY N/A 11/27/2018   Procedure: RIGHT/LEFT HEART CATH AND CORONARY ANGIOGRAPHY;  Surgeon: Sherren Mocha, MD;  Location: [place] CV LAB;  Service: Cardiovascular;  Laterality: N/A;  . TEE WITHOUT CARDIOVERSION N/A 12/30/2018   Procedure: TRANSESOPHAGEAL ECHOCARDIOGRAM (TEE);  Surgeon: Sherren Mocha, MD;  Location: [place] CV LAB;  Service: Open Heart Surgery;  Laterality: N/A;  . TOTAL HIP ARTHROPLASTY Bilateral   . TRANSCATHETER AORTIC VALVE REPLACEMENT, TRANSFEMORAL N/A 12/30/2018   Procedure: TRANSCATHETER AORTIC  VALVE REPLACEMENT, TRANSFEMORAL;  Surgeon: Sherren Mocha, MD;  Location: [place] CV LAB;  Service: Open Heart Surgery;  Laterality: N/A;  . TUBAL LIGATION  1975    Allergies as of 08/23/2020   No Known Allergies     Medication List       Accurate as of August 23, 2020 11:59 PM. If you have any questions, ask your nurse or doctor.        acetaminophen 325 MG tablet Commonly known as: TYLENOL Take 2 tablets (650 mg total) by mouth every 6 (six) hours as needed for mild pain (or Fever >/= 101).   amLODipine 10 MG tablet Commonly known as: NORVASC Take 1 tablet (10 mg total) by mouth  daily.   amoxicillin 500 MG tablet Commonly known as: AMOXIL Take 4 tablets (2,000mg ) one hour prior to all dental visits.   aspirin EC 81 MG tablet Take 81 mg by mouth daily.   CALCIUM 600 + D PO Take 600 mg by mouth 3 (three) times a week.   denosumab 60 MG/ML Soln injection Commonly known as: PROLIA Inject 60 mg into the skin every 6 (six) months. Administer in upper arm, thigh, or abdomen   EPINEPHrine 0.3 mg/0.3 mL Soaj injection Commonly known as: EPI-PEN Inject 0.3 mg into the muscle as needed for anaphylaxis.   Incruse Ellipta 62.5 MCG/INH Aepb Generic drug: umeclidinium bromide Inhale 1 puff into the lungs daily. Started by: Kathlene November, MD   loratadine 10 MG tablet Commonly known as: CLARITIN Take 10 mg by mouth daily as needed for allergies.   magnesium oxide 400 MG tablet Commonly known as: MAG-OX Take 1 tablet by mouth daily.   metoprolol succinate 25 MG 24 hr tablet Commonly known as: TOPROL-XL TAKE 1 TABLET BY MOUTH EVERY DAY   multivitamin with minerals Tabs tablet Take 1 tablet by mouth daily.   rosuvastatin 40 MG tablet Commonly known as: CRESTOR TAKE 1 TABLET BY MOUTH EVERY DAY   Ventolin HFA 108 (90 Base) MCG/ACT inhaler Generic drug: albuterol TAKE 2 PUFFS BY MOUTH EVERY 6 HOURS AS NEEDED FOR WHEEZE OR SHORTNESS OF BREATH   Vitamin D3 125 MCG (5000 UT) Caps Take 5,000 Units by mouth daily.          Objective:   Physical Exam BP 118/75 (BP Location: Left Arm, Patient Position: Sitting, Cuff Size: Small)   Pulse 73   Temp 97.6 F (36.4 C) (Oral)   Resp 18   Ht 5\' 6"  (1.676 m)   Wt 163 lb 8 oz (74.2 kg)   SpO2 91%   BMI 26.39 kg/m  General:   Well developed, NAD, BMI noted. HEENT:  Normocephalic . Face symmetric, atraumatic Lungs:  CTA B Normal respiratory effort, no intercostal retractions, no accessory muscle use. Heart: RRR,  no murmur.  Lower extremities: no pretibial edema bilaterally  Skin: Not pale. Not  jaundice Neurologic:  alert & oriented X3.  Speech normal, gait appropriate for age and unassisted Psych--  Cognition and judgment appear intact.  Cooperative with normal attention span and concentration.  Behavior appropriate. No anxious or depressed appearing.      Assessment     Assessment HTN Hyperlipidemia PULM: -H/o Emphysema, quit tobacco 2006 -CT chest angio  05-2016---> pulm nodule x2,reticular infiltrate ; f/u CT 11/2016 stable nodule, no f/u -CT chest angio  05-2016--->  enlarged R P.A. and Ao ; hac CT coronary 05/2017, f/u per cards -Abnormal PET scan 12-2019 CV: -DOE: (-) myoview  8-017, CT  angio chest 05-2016:  no PE  -S/p TVAR 12/2018 -Paroxysmal A. fib DX 6/28/ 2020 in the context of acute cholecystitis, converted to NSR, not anticoagulated as of 05/25/2019 -Aspirin indefinitely, SBE prophylaxis with amoxicillin -MSK --DJD --Sacral Fx, sacral insufficiency, s/p sacroplasty (radiology) 07-2015 --H/o osteoporosis :  took fosamax while in [place], Fosamax rx again by ortho after sacral Fx 07-2015, never took it, first Prolia 11-14-2015 --T score 08-2015 >> normal (likely normal d/t DJD?) H/o vit D def: Normal level 09-2015   PLAN  HTN: On amlodipine, metoprolol, controlled, last BMP very good. CV: Saw cardiology 07/21/2020: Was seen with palpitations, edema, with history of PAF they agreed to order a Zio patch (pending); a echocardiogram was satisfactory, blood work normal. Since then, edema is better, seems to be dependent, leg elevation helps. Emphysema: Complain of chronic DOE without cough or wheezing,  recent echocardiogram satisfactory.  Uses albuterol as needed. Plan: Caprice Renshaw daily, see if that helps with chronic DOE Osteoporosis: Prolia today Preventive care: Had moderna vaccination x3 Flu shot today RTC 4 months    This visit occurred during the SARS-CoV-2 public health emergency.  Safety protocols were in place, including screening questions prior to  the visit, additional usage of staff PPE, and extensive cleaning of exam room while observing appropriate contact time as indicated for disinfecting solutions.

## 2020-08-23 NOTE — Patient Instructions (Addendum)
Start a medication called Tudorza: 1 puff every day.  If is not helping with your difficulty breathing when you walk let us know  You still can use albuterol for cough or wheezing     GO TO THE FRONT DESK, PLEASE SCHEDULE YOUR APPOINTMENTS Come back for a checkup in 4 months

## 2020-08-25 NOTE — Assessment & Plan Note (Signed)
HTN: On amlodipine, metoprolol, controlled, last BMP very good. CV: Saw cardiology 07/21/2020: Was seen with palpitations, edema, with history of PAF they agreed to order a Zio patch (pending); a echocardiogram was satisfactory, blood work normal. Since then, edema is better, seems to be dependent, leg elevation helps. Emphysema: Complain of chronic DOE without cough or wheezing,  recent echocardiogram satisfactory.  Uses albuterol as needed. Plan: Dominique [name] daily, see if that helps with chronic DOE Osteoporosis: Prolia today Preventive care: Had moderna vaccination x3 Flu shot today RTC 4 months

## 2020-08-26 DIAGNOSIS — R002 Palpitations: Secondary | ICD-10-CM | POA: Diagnosis not present

## 2020-09-02 ENCOUNTER — Ambulatory Visit (INDEPENDENT_AMBULATORY_CARE_PROVIDER_SITE_OTHER): Payer: Medicare Other | Admitting: Cardiovascular Disease

## 2020-09-02 ENCOUNTER — Other Ambulatory Visit: Payer: Self-pay

## 2020-09-02 ENCOUNTER — Other Ambulatory Visit: Payer: Self-pay | Admitting: Internal Medicine

## 2020-09-02 ENCOUNTER — Encounter: Payer: Self-pay | Admitting: Cardiovascular Disease

## 2020-09-02 VITALS — BP 130/76 | HR 74 | Ht 66.0 in | Wt 167.0 lb

## 2020-09-02 DIAGNOSIS — Z952 Presence of prosthetic heart valve: Secondary | ICD-10-CM | POA: Diagnosis not present

## 2020-09-02 DIAGNOSIS — R0602 Shortness of breath: Secondary | ICD-10-CM

## 2020-09-02 DIAGNOSIS — I10 Essential (primary) hypertension: Secondary | ICD-10-CM | POA: Diagnosis not present

## 2020-09-02 DIAGNOSIS — I2510 Atherosclerotic heart disease of native coronary artery without angina pectoris: Secondary | ICD-10-CM

## 2020-09-02 DIAGNOSIS — I6523 Occlusion and stenosis of bilateral carotid arteries: Secondary | ICD-10-CM | POA: Diagnosis not present

## 2020-09-02 DIAGNOSIS — E7800 Pure hypercholesterolemia, unspecified: Secondary | ICD-10-CM | POA: Diagnosis not present

## 2020-09-02 MED ORDER — FUROSEMIDE 20 MG PO TABS: ORAL_TABLET | ORAL | 3 refills | Status: AC

## 2020-09-02 MED ORDER — FUROSEMIDE 20 MG PO TABS: ORAL_TABLET | ORAL | 3 refills | Status: DC

## 2020-09-02 NOTE — Progress Notes (Signed)
Cardiology Office Note   Date:  09/02/2020   ID:  Dominique [name], DOB 1941/10/11, MRN [id number]  PCP:  Colon Branch, MD  Cardiologist:   Fransico Him, MD  Chief Complaint  Patient presents with  . Follow-up    5 weeks.  . Edema    Left foot and ankle.  Marland Kitchen Shortness of Breath      History of Present Illness: Dominique [name] is a 79 y.o. female with severe aortic stenosis s/p TAVR, non-obstructive CAD, mild carotid stenosis, hyperlipidemia, moderate TR, mild pulmonary hypertension, hypertension, and severe COPD here for follow-up.Dominique [name] saw Dr. Radford Pax on 05/2017. She was noted to have moderate aortic stenosis (peak velocity 3.1 m/s, mean gradient 21 mmHg). She had a chest CT 11/2016 that showed coronary calcifications. She subsequently had a coronary CT-A 05/2017 that revealed less than 50% plaque in the proximal LAD and a mild ascending aortic aneurysm(3.9 cm). She was noted to have calcification of the aorta and aortic valve. Echo 05/31/17 that revealed LVEF 60-65% with normal diastolic function. She had a repeat echo 05/2018 that showed her mean aortic valve gradient increased to 36 mmHg.She continued to report chest discomfort so she had a repeat coronary CT-A 08/2018 that revealed 25-49%stenosis of the proximal LAD unchanged from prior.   She reported increasing exertional dyspnea and chest discomfort.  She was referred to Dr. [name] and underwent TAVR 50mm Medtronic CoreValve Evolut Proon 12/2018.  She had a cardiac catheterization prior to TAVR that showed normal coronary arteries.    Postoperative echo revealed a mean gradient of 6 mmHg.  However this procedure was complicated by an ischemic left leg.  She went to the OR emergently with Dr. [name] for repair of the left femoral artery with endarterectomy and dacryon patch angioplasty.  Dominique [name] required a laparoscopic cholecystectomy 03/2019.  Her hospitalization was complicated by atrial fibrillation.  This was  thought to be due to her acute illness and she was not started on long-term anticoagulation.  She is recovering from that well.  She saw Dr. Larose Kells on 10/22 and amlodipine was increased due to poorly controlled blood pressure.  Her blood pressure has been well controlled since then.  She does report some episodes of presyncope.  It seems to occur randomly.  In the last several weeks she has had 3 events where one time she was standing at her kitchen sink and felt dizzy for a few seconds.  The other time occurred while sitting in a third occurred while standing at a museum.  She denies orthostatic symptoms.  She did not have any palpitations at the time.  She has not experienced any chest pain.  She does report that she still feels short of breath with exertion.  This never completely resolved after her surgery.  She was initially enrolled in cardiac rehab but was unable to complete much of it due to closure for the coronavirus.  She saw Dr. Larose Kells 05/2019 and reported symptoms concerning for TIA.  She had bilateral carotid Dopplers that showed 60 to 79% right ICA stenosis and 1 to 39% stenosis on the left.  She was referred to neurology and had an MRI of the brain 06/30/2019 that showed chronic small vessel changes but no acute findings.  Repeat carotid Dopplers 02/2020 revealed mild stenosis bilaterally.  Dominique [name] had heart racing after her third COVID-19 vaccination 06/2020.  She wore a heart monitor that showed several runs of SVT.  The longest episode was  7 beats.  Overall the symptoms seem to be subsiding.  She notes some swelling in the L>R LE.  In the past it occurred in the summer or flying.  Lately it has been more often.  She has been feeling short of breath with minimal exertion.  She denies orthopnea or PND.  She saw Dr. Larose Kells and he prescribed Incruse Ellipta.  She picked it up but has not yet used it due to concern for potential side effects and the fact that it warned against stopping abruptly.  Yesterday she  had an episode of chest pain while playing cards with friends.  She denies exertional chest pain.   Past Medical History:  Diagnosis Date  . Atrial fibrillation ([place])    a. dx 03/2019 while in hospital with cholecystitis  . COPD (chronic obstructive pulmonary disease) ([place])   . Critical lower limb ischemia (HCC)    a. after TAVR developed ischemic L leg likely due to flap/closure, s/p emergent repair of left femoral artery with endarterectomy and Dacron patch angioplasty.  . Emphysema lung ([place])   . Hyperlipidemia    LDL goal < 70  . Hypertension   . Hypomagnesemia   . Normal coronary arteries 10/2018  . NSVT (nonsustained ventricular tachycardia) ([place]) 03/02/2019  . Pulmonary HTN ([place]) 06/05/2016   Moderate with PASP 67mmHg by echo 04/2016 likely Group 3 from COPD and possibly Group 2 from pulmonary venous HTN associated with moderate AS - f/u echo 12/2018 post TAVR showed normal RVSP  . Pulmonary nodules    seen on pre TAVR CT, needs 12 month CT follow up  . 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 Sacral fracture ([place])   . Severe aortic stenosis    a. s/p TAVR 12/2018.  Marland Kitchen Thoracic ascending aortic aneurysm [facility])    needs yearly follow up    Past Surgical History:  Procedure Laterality Date  . APPENDECTOMY  1951  . CATARACT EXTRACTION    . CESAREAN SECTION     1971  . CHOLECYSTECTOMY N/A 04/28/2019   Procedure: LAPAROSCOPIC CHOLECYSTECTOMY;  Surgeon: Stark Klein, MD;  [facility];  Service: General;  Laterality: N/A;  . COLONOSCOPY  2018  . FEMORAL-POPLITEAL BYPASS GRAFT Left 12/31/2018   Procedure: PATCH ANGIOPLASTY REPAIR LEFT FEMORAL ARTERY USING HEMASHIELD PLATINUM FINESSE PATCH;  Surgeon: Rosetta Posner, MD;  Location: [place];  Service: Vascular;  Laterality: Left;  . IR RADIOLOGY PERIPHERAL GUIDED IV START  05/30/2017  . IR RADIOLOGY PERIPHERAL GUIDED IV START   09/02/2018  . IR RADIOLOGY PERIPHERAL GUIDED IV START  09/02/2018  . IR US GUIDE VASC ACCESS LEFT  09/02/2018  . IR US GUIDE VASC ACCESS RIGHT  05/30/2017  . IR US GUIDE VASC ACCESS RIGHT  09/02/2018  . JOINT REPLACEMENT  2016   sacroplasty  . LAMINECTOMY    . RIGHT/LEFT HEART CATH AND CORONARY ANGIOGRAPHY N/A 11/27/2018   Procedure: RIGHT/LEFT HEART CATH AND CORONARY ANGIOGRAPHY;  Surgeon: Sherren Mocha, MD;  Location: [place] CV LAB;  Service: Cardiovascular;  Laterality: N/A;  . TEE WITHOUT CARDIOVERSION N/A 12/30/2018   Procedure: TRANSESOPHAGEAL ECHOCARDIOGRAM (TEE);  Surgeon: Sherren Mocha, MD;  Location: [place] CV LAB;  Service: Open Heart Surgery;  Laterality: N/A;  . TOTAL HIP ARTHROPLASTY Bilateral   . TRANSCATHETER AORTIC VALVE REPLACEMENT, TRANSFEMORAL N/A 12/30/2018   Procedure: TRANSCATHETER AORTIC VALVE REPLACEMENT, TRANSFEMORAL;  Surgeon: [name],  Legrand Como, MD;  Location: [place] CV LAB;  Service: Open Heart Surgery;  Laterality: N/A;  . TUBAL LIGATION  1975     Current Outpatient Medications  Medication Sig Dispense Refill  . acetaminophen (TYLENOL) 325 MG tablet Take 2 tablets (650 mg total) by mouth every 6 (six) hours as needed for mild pain (or Fever >/= 101).    Marland Kitchen amLODipine (NORVASC) 10 MG tablet Take 1 tablet (10 mg total) by mouth daily. 90 tablet 1  . amoxicillin (AMOXIL) 500 MG tablet Take 4 tablets (2,000mg ) one hour prior to all dental visits. 28 tablet 0  . aspirin EC 81 MG tablet Take 81 mg by mouth daily.    . Calcium Carb-Cholecalciferol (CALCIUM 600 + D PO) Take 600 mg by mouth 3 (three) times a week.     . Cholecalciferol (VITAMIN D3) 5000 UNITS CAPS Take 5,000 Units by mouth daily.     Marland Kitchen denosumab (PROLIA) 60 MG/ML SOLN injection Inject 60 mg into the skin every 6 (six) months. Administer in upper arm, thigh, or abdomen    . EPINEPHrine 0.3 mg/0.3 mL IJ SOAJ injection Inject 0.3 mg into the muscle as needed for anaphylaxis.   12  . loratadine  (CLARITIN) 10 MG tablet Take 10 mg by mouth daily as needed for allergies.     . magnesium oxide (MAG-OX) 400 MG tablet Take 1 tablet by mouth daily.    . metoprolol succinate (TOPROL-XL) 25 MG 24 hr tablet TAKE 1 TABLET BY MOUTH EVERY DAY 90 tablet 2  . Multiple Vitamin (MULTIVITAMIN WITH MINERALS) TABS tablet Take 1 tablet by mouth daily.    . rosuvastatin (CRESTOR) 40 MG tablet TAKE 1 TABLET BY MOUTH EVERY DAY 90 tablet 3  . umeclidinium bromide (INCRUSE ELLIPTA) 62.5 MCG/INH AEPB Inhale 1 puff into the lungs daily. 30 each 3  . VENTOLIN HFA 108 (90 Base) MCG/ACT inhaler TAKE 2 PUFFS BY MOUTH EVERY 6 HOURS AS NEEDED FOR WHEEZE OR SHORTNESS OF BREATH 18 g 1  . furosemide (LASIX) 20 MG tablet DAILY AS NEEDED FOR SWELLING 30 tablet 3   No current facility-administered medications for this visit.    Allergies:   Patient has no known allergies.    Social History:  The patient  reports that she quit smoking about 16 years ago. Her smoking use included cigarettes. She has a 60.00 pack-year smoking history. She has never used smokeless tobacco. She reports current alcohol use of about 3.0 - 4.0 standard drinks of alcohol per week. She reports that she does not use drugs.   Family History:  The patient's family history includes Breast cancer in her mother; Cancer in her brother; Diabetes in her son.    ROS:  Please see the history of present illness.   Otherwise, review of systems are positive for none.   All other systems are reviewed and negative.    PHYSICAL EXAM: VS:  BP 130/76 (BP Location: Left Arm, Patient Position: Sitting, Cuff Size: Normal)   Pulse 74   Ht 5\' 6"  (1.676 m)   Wt 167 lb (75.8 kg)   BMI 26.95 kg/m  , BMI Body mass index is 26.95 kg/m. GENERAL:  Well appearing HEENT: Pupils equal round and reactive, fundi not visualized, oral mucosa unremarkable NECK:  No jugular venous distention, waveform within normal limits, carotid upstroke brisk and symmetric, no bruits LUNGS:   Clear to auscultation bilaterally HEART:  RRR.  PMI not displaced or sustained,S1 and S2 within normal limits, no  S3, no S4, no clicks, no rubs, III/VI systolic murmur at the  LUSB ABD:  Flat, positive bowel sounds normal in frequency in pitch, no bruits, no rebound, no guarding, no midline pulsatile mass, no hepatomegaly, no splenomegaly EXT:  2 plus pulses throughout, no edema, no cyanosis no clubbing SKIN:  No rashes no nodules NEURO:  Cranial nerves II through XII grossly intact, motor grossly intact throughout PSYCH:  Cognitively intact, oriented to person place and time  EKG:  EKG is not ordered today.  Coronary CT-A 05/30/17: IMPRESSION: 1) Calcium score 64 which is 109 th percentile for age and sex  2) Left dominant coronary arteries with less than 50% calcified stenosis in proximal LAD  3) Aortic root dilatation 3.9 cm  4) Aortic and Aortic valve calcification  Coronary CT-A 09/02/18: IMPRESSION: 1. Coronary calcium score of 81.6. This was 49th percentile for age and sex matched control.  2. Normal coronary origin with left dominance.  3.  Non-obstructive CAD in the proximal LAD, unchanged from 05/2017.   Echo 12/31/18 IMPRESSIONS 1. The left ventricle has hyperdynamic systolic function, with an ejection fraction of >65%. The cavity size was normal. There is moderately increased left ventricular wall thickness. Left ventricular diastolic Doppler parameters are consistent with  impaired relaxation Indeterminent filling pressures No evidence of left ventricular regional wall motion abnormalities. 2. The right ventricle has normal systolic function. The cavity was normal. There is no increase in right ventricular wall thickness. 3. Left atrial size was mildly dilated. 4. The mitral valve is degenerative. Mild calcification of the mitral valve leaflet. 5. The aortic root and ascending aorta are normal in size and structure. 6. The inferior vena cava was dilated in size  with <50% respiratory variability. 7. No evidence of left ventricular regional wall motion abnormalities. 8. When compared to the prior study: 10/30/2018 - 60-65%, severe AS.  Aortic Valve: The aortic valve has been repaired/replaced Aortic valve regurgitation was not visualized by color flow Doppler. There is no evidence of aortic valve stenosis.   Recent Labs: 02/18/2020: ALT 20 07/21/2020: BUN 17; Creatinine, Ser 0.69; Hemoglobin 14.9; Magnesium 2.0; Platelets 236; Potassium 4.3; Sodium 140; TSH 1.070    Lipid Panel    Component Value Date/Time   CHOL 136 02/18/2020 1102   CHOL 139 05/28/2019 1740   TRIG 95.0 02/18/2020 1102   HDL 64.30 02/18/2020 1102   HDL 54 05/28/2019 1740   CHOLHDL 2 02/18/2020 1102   VLDL 19.0 02/18/2020 1102   LDLCALC 53 02/18/2020 1102   LDLCALC 59 05/28/2019 1740      Wt Readings from Last 3 Encounters:  09/02/20 167 lb (75.8 kg)  08/23/20 163 lb 8 oz (74.2 kg)  07/21/20 163 lb 9.6 oz (74.2 kg)      ASSESSMENT AND PLAN:  # Carotid Stenosis:  Carotid stenosis has been mild with the exception of 1 reading showing 60-79% R ICA stenosis.  We will get carotid CT-A 02/2021 to clarify.  Continue aspirin and rosuvastatin.  # Hyperlipidemia: LDL 53 on 01/2020.  Continue rosuvastatin.  # s/p TAVR:  Dominique [name] had a 26 mm Medronic Evolut Pro-Plus TAVR 12/2018.  She is doing well.  Mean graident 7 mmHg 12/2018.  # Shortness of breath:  I suspect this is mostly due to her COPD.  I agree that she should start the inhalers prescribed by Dr. Larose Kells.  She is willing to give it a try.  We will also check a BNP.  She is struggled with  some lower extremity edema.  We will give her 20 mg of Lasix to take as needed.  # Mild ascending aorta aneurysm: Stable.  3.8 cm 08/2018.  # Hypertension: BP is well have been controlled on amlodipine and metoprolol.  # Non-obstructive coronary calcification: No CAD on cath.  Minimal disease on coronary CT-A.  Continue aspirin  and statin.  # Mild pulmonary hypertension: Likely 2/2 COPD.  She is euvolemic.   Current medicines are reviewed at length with the patient today.  The patient does not have concerns regarding medicines.  The following changes have been made:  none  Labs/ tests ordered today include:   Orders Placed This Encounter  Procedures  . CT ANGIO NECK W OR WO CONTRAST  . B Nat Peptide     Disposition:   FU with Jewelz [name] C. [name], MD, [facility] in 6 months.   Signed, Gedalia Mcmillon C. [name], MD, [facility]  09/02/2020 12:43 PM    [place]

## 2020-09-02 NOTE — Patient Instructions (Addendum)
Medication Instructions:  START FUROSEMIDE 20 MG DAILY AS NEEDED FOR SWELLING  *If you need a refill on your cardiac medications before your next appointment, please call your pharmacy*  Lab Work: BNP TODAY   Testing/Procedures: CTA OF NECK IN MAY  Follow-Up: At [facility], you and your health needs are our priority.  As part of our continuing mission to provide you with exceptional heart care, we have created designated Provider Care Teams.  These Care Teams include your primary Cardiologist (physician) and Advanced Practice Providers (APPs -  Physician Assistants and Nurse Practitioners) who all work together to provide you with the care you need, when you need it.  We recommend signing up for the patient portal called "MyChart".  Sign up information is provided on this After Visit Summary.  MyChart is used to connect with patients for Virtual Visits (Telemedicine).  Patients are able to view lab/test results, encounter notes, upcoming appointments, etc.  Non-urgent messages can be sent to your provider as well.   To learn more about what you can do with MyChart, go to [email or web address].    Your next appointment:   6 month(s)  You will receive a reminder letter in the mail two months in advance. If you don't receive a letter, please call our office to schedule the follow-up appointment.  The format for your next appointment:   In Person  Provider:   You may see [name], MD or one of the following Advanced Practice Providers on your designated Care Team:    Kerin Ransom, PA-C  [place], [place]  Coletta Memos, [place]

## 2020-09-03 LAB — BRAIN NATRIURETIC PEPTIDE: BNP: 67 pg/mL (ref 0.0–100.0)

## 2020-09-04 ENCOUNTER — Other Ambulatory Visit: Payer: Self-pay | Admitting: Cardiovascular Disease

## 2020-09-14 ENCOUNTER — Other Ambulatory Visit: Payer: Self-pay

## 2020-09-14 DIAGNOSIS — E7800 Pure hypercholesterolemia, unspecified: Secondary | ICD-10-CM

## 2020-09-14 DIAGNOSIS — M810 Age-related osteoporosis without current pathological fracture: Secondary | ICD-10-CM

## 2020-09-14 DIAGNOSIS — I10 Essential (primary) hypertension: Secondary | ICD-10-CM

## 2020-09-19 ENCOUNTER — Ambulatory Visit (INDEPENDENT_AMBULATORY_CARE_PROVIDER_SITE_OTHER): Payer: Medicare Other | Admitting: Adult Health

## 2020-09-19 ENCOUNTER — Telehealth: Payer: Self-pay | Admitting: Pharmacist

## 2020-09-19 ENCOUNTER — Encounter: Payer: Self-pay | Admitting: Adult Health

## 2020-09-19 VITALS — BP 125/66 | HR 74 | Ht 66.0 in | Wt 165.0 lb

## 2020-09-19 DIAGNOSIS — G43009 Migraine without aura, not intractable, without status migrainosus: Secondary | ICD-10-CM | POA: Diagnosis not present

## 2020-09-19 NOTE — Progress Notes (Signed)
Chronic Care Management Pharmacy Assistant   Name: Dominique [name]  MRN: [id number] DOB: [birth year]  Reason for Encounter: Initial Questions   PCP : Colon Branch, MD  Allergies:  No Known Allergies  Medications: Outpatient Encounter Medications as of 09/19/2020  Medication Sig Note  . acetaminophen (TYLENOL) 325 MG tablet Take 2 tablets (650 mg total) by mouth every 6 (six) hours as needed for mild pain (or Fever >/= 101). 02/18/2020: PRN  . amLODipine (NORVASC) 10 MG tablet Take 1 tablet (10 mg total) by mouth daily.   Marland Kitchen amoxicillin (AMOXIL) 500 MG tablet Take 4 tablets (2,000mg ) one hour prior to all dental visits. 02/18/2020: PRN dental  . aspirin EC 81 MG tablet Take 81 mg by mouth daily.   . Calcium Carb-Cholecalciferol (CALCIUM 600 + D PO) Take 600 mg by mouth 3 (three) times a week.    . Cholecalciferol (VITAMIN D3) 5000 UNITS CAPS Take 5,000 Units by mouth daily.    Marland Kitchen denosumab (PROLIA) 60 MG/ML SOLN injection Inject 60 mg into the skin every 6 (six) months. Administer in upper arm, thigh, or abdomen   . EPINEPHrine 0.3 mg/0.3 mL IJ SOAJ injection Inject 0.3 mg into the muscle as needed for anaphylaxis.  02/18/2020: PRN  . furosemide (LASIX) 20 MG tablet DAILY AS NEEDED FOR SWELLING   . loratadine (CLARITIN) 10 MG tablet Take 10 mg by mouth daily as needed for allergies.    . magnesium oxide (MAG-OX) 400 MG tablet Take 1 tablet by mouth daily.   . metoprolol succinate (TOPROL-XL) 25 MG 24 hr tablet TAKE 1 TABLET BY MOUTH EVERY DAY   . Multiple Vitamin (MULTIVITAMIN WITH MINERALS) TABS tablet Take 1 tablet by mouth daily.   . rosuvastatin (CRESTOR) 40 MG tablet TAKE 1 TABLET BY MOUTH EVERY DAY   . umeclidinium bromide (INCRUSE ELLIPTA) 62.5 MCG/INH AEPB Inhale 1 puff into the lungs daily.   . VENTOLIN HFA 108 (90 Base) MCG/ACT inhaler TAKE 2 PUFFS BY MOUTH EVERY 6 HOURS AS NEEDED FOR WHEEZE OR SHORTNESS OF BREATH   . [DISCONTINUED] Aclidinium Bromide (TUDORZA PRESSAIR) 400  MCG/ACT AEPB Inhale 1 puff into the lungs daily.    No facility-administered encounter medications on file as of 09/19/2020.    Current Diagnosis: Patient Active Problem List   Diagnosis Date Noted  . Carotid stenosis, bilateral 03/23/2020  . Osteoporosis 02/20/2020  . Atypical migraine 10/20/2019  . NSVT (nonsustained ventricular tachycardia) ([place]) 03/02/2019  . Ischemic leg 01/01/2019  . Thoracic ascending aortic aneurysm ([place]) 12/30/2018  . S/P TAVR (transcatheter aortic valve replacement) 12/30/2018  . CAD (coronary artery disease), native coronary artery   . Multiple lung nodules 06/15/2016  . Severe aortic stenosis   . PCP NOTES >>>>>>> 10/15/2015  . Vitamin D deficiency 10/14/2015  . Sacral fracture ([place])   . Dyspnea 04/12/2015  . History of colonic polyps 03/30/2015  . Hypocalcemia 12/27/2014  . Annual physical exam 09/27/2014  . Essential hypertension 03/19/2014  . Hyperlipidemia 03/19/2014  . COPD GOLD II 03/19/2014    Goals Addressed   None     Chronic Care Management   Outreach Note  09/19/2020 Name: Dominique [name] MRN: [id number] DOB: 07-31-1941  Referred by: Colon Branch, MD Reason for referral : Chronic Care Management   An unsuccessful telephone outreach was attempted today. The patient was referred to the pharmacist for assistance with care management and care coordination.    Documented preliminary medication plan in preparation for patient's initial  chronic care management visit.  Follow-Up:  Pharmacist Review   Fanny Skates, [place] Pharmacist Assistant [phone]

## 2020-09-19 NOTE — Patient Instructions (Signed)
Continue aspirin 81 mg daily  and Crestor  for secondary stroke prevention  Continue to follow with cardiology routinely for monitoring of carotid stenosis and other cardiac conditions  Continue to follow up with PCP regarding cholesterol and blood pressure management  Maintain strict control of hypertension with blood pressure goal below 130/90 and cholesterol with LDL cholesterol (bad cholesterol) goal below 70 mg/dL.          Thank you for coming to see [place] at [facility]. I hope we have been able to provide you high quality care today.  You may receive a patient satisfaction survey over the next few weeks. We would appreciate your feedback and comments so that we may continue to improve ourselves and the health of our patients.

## 2020-09-19 NOTE — Progress Notes (Signed)
[facility] [address] [place]. [place] [address] [phone]       OFFICE FOLLOW UP VISIT NOTE  [name] Date of Birth:  1941-06-04 Medical Record Number:  [id number]  [name] a lot of time [name] Referring MD: Belinda Fisher  Reason for Referral: Atypical migraine follow-up   Chief complaint: Chief Complaint  Patient presents with  . Follow-up    rm 9, alone, migraine, pt states she is doing well, reports some migraines     HPI:   Today, 09/19/2020, Dominique [name] returns for 3-month follow-up.  She has been stable since prior visit reporting occasional migraine headaches associated with transient blurred vision but relatively stable without increase in frequency or duration.  She remains on aspirin and Crestor for stroke prevention without side effects.  Blood pressure today 125/66.  No concerns at this time    History provided for reference purposes only Update 03/15/2020 JM: Dominique [name] returns for 78-month follow-up.  Since prior visit, she has been doing well without new or reoccurring stroke symptoms. Occasional headaches with improvement with possibly one time monthly. Continues on aspirin and Crestor for secondary stroke prevention.  Blood pressure today 132/62. Recent LDL 53 on 02/18/2020. Recent carotid ultrasound ordered by cardiology on 03/07/2020 showed bilateral ICA 1 to 39% stenosis without evidence of right ICA 60 to 79% stenosis as evidenced on prior exam.  No concerns at this time.   Update 09/14/2019 Dr. Leonie Man : She returns for follow-up after last visit 3 months ago.  She is doing well she is not having recurrent TIA or stroke symptoms.  She still having some episodes of headache and blurred vision about once every 2 weeks or so.  These are much improved and not as severe or frequent.  She is unable to and without specific triggers for these episodes.  She underwent MRI scan of the brain on 06/30/2019 which I personally reviewed  shows mild age-appropriate changes of small vessel disease but no acute abnormality stroke or tumor.  MRI of the brain shows no significant large vessel stenosis or occlusion.  She saw cardiologist Dr. [name] last week who ordered carotid ultrasound 12 done on 06/01/2019 showed 60 to 79% right ICA and 1-39% left ICA stenosis.  Evaluation of a cardiac monitoring not showing any recent A. fib.  Patient states she is doing well and has had no new complaints  Initial consult 05/28/2019 Dr. Phillips Climes. [name] is a 79 year old Caucasian lady of [place] origin who is seen today for initial office consultation visit for headaches and blurred vision episodes.  History is obtained from her, review of electronic medical records and personally reviewed imaging films in PACS.  She states that she has a longstanding history of intermittent headaches and blurred vision since she was in her 88s.  She had outgrown them but when she was in her late 24s they started recurring at a frequency of once per month or so.  However since the last 3 weeks following gallbladder surgery on 04/28/2019 she has had daily headaches as well as blurred vision episodes which surprisingly seem to have stopped in the last 2 days.  She describes the headache as starting in the left temples with moderate intensity not accompanied by nausea vomiting light or sound sensitivity.  She takes Tylenol which seems to work and the headache goes away.  The blurred vision involving both eyes and last about 5 to 10 minutes.  After she has a cup of coffee  with sugar that disappears as well.  She denies any true vertigo, speech difficulties, loss of vision, gait or balance problems or numbness.  She denies any episodes in the past suggestive of strokes or definite TIAs.  She was recently diagnosed to have atrial fibrillation on 04/26/2019 and is currently wearing a zio patch monitor to further documented.  She has a chads 2 vascular score of 4.  She is currently on  Toprol-XL she was previously on aspirin and Plavix but after her gallbladder surgery was switched only to aspirin alone.  She is tolerating well without bruising or bleeding.  She denies symptoms of scalp tenderness, jaw claudication, arthralgias or myalgias.  Blood pressures well controlled today it is 137/76.  She has history of aortic valve replacement transfemoral early following which she had some complications requiring emergent femoral arterial endarterectomy with Dacron patch angioplasty.  She did undergo CT scan of the head on 05/25/2019 which I personally reviewed shows no acute abnormality.  She is not had an MRI.  She is scheduled to undergo carotid ultrasound on 06/01/2019.  She has not had any recent lipid profile or hemoglobin A1c checked.  ROS:   14 system review of systems is positive for no complaints and all other systems negative   PMH:  Past Medical History:  Diagnosis Date  . Atrial fibrillation ([place])    a. dx 03/2019 while in hospital with cholecystitis  . COPD (chronic obstructive pulmonary disease) ([place])   . Critical lower limb ischemia (H[facility])    a. after TAVR developed ischemic L leg likely due to flap/closure, s/p emergent repair of left femoral artery with endarterectomy and Dacron patch angioplasty.  . Emphysema lung ([place])   . Hyperlipidemia    LDL goal < 70  . Hypertension   . Hypomagnesemia   . Normal coronary arteries 10/2018  . NSVT (nonsustained ventricular tachycardia) ([place]) 03/02/2019  . Pulmonary HTN ([place]) 06/05/2016   Moderate with PASP 30mmHg by echo 04/2016 likely Group 3 from COPD and possibly Group 2 from pulmonary venous HTN associated with moderate AS - f/u echo 12/2018 post TAVR showed normal RVSP  . Pulmonary nodules    seen on pre TAVR CT, needs 12 month CT follow up  . 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 Sacral fracture ([place])   . Severe  aortic stenosis    a. s/p TAVR 12/2018.  Marland Kitchen Thoracic ascending aortic aneurysm [facility])    needs yearly follow up    Social History:  Social History   Socioeconomic History  . Marital status: Widowed    Spouse name: Not on file  . Number of children: 2  . Years of education: Not on file  . Highest education level: Not on file  Occupational History  . Occupation: n/a  Tobacco Use  . Smoking status: Former Smoker    Packs/day: 1.50    Years: 40.00    Pack years: 60.00    Types: Cigarettes    Quit date: 03/19/2004    Years since quitting: 16.5  . Smokeless tobacco: Never Used  Vaping Use  . Vaping Use: Never used  Substance and Sexual Activity  . Alcohol use: Yes    Alcohol/week: 3.0 - 4.0 standard drinks    Types: 3 - 4 Glasses of wine per week    Comment: socially  . Drug use: No  . Sexual activity: Not on  file  Other Topics Concern  . Not on file  Social History Narrative   From [place]   1 living son   Social Determinants of Health   Financial Resource Strain: Low Risk   . Difficulty of Paying Living Expenses: Not hard at all  Food Insecurity: No Food Insecurity  . Worried About Charity fundraiser in the Last Year: Never true  . Ran Out of Food in the Last Year: Never true  Transportation Needs: No Transportation Needs  . Lack of Transportation (Medical): No  . Lack of Transportation (Non-Medical): No  Physical Activity:   . Days of Exercise per Week: Not on file  . Minutes of Exercise per Session: Not on file  Stress:   . Feeling of Stress : Not on file  Social Connections:   . Frequency of Communication with Friends and Family: Not on file  . Frequency of Social Gatherings with Friends and Family: Not on file  . Attends Religious Services: Not on file  . Active Member of Clubs or Organizations: Not on file  . Attends Archivist Meetings: Not on file  . Marital Status: Not on file  Intimate Partner Violence:   . Fear of Current or Ex-Partner: Not  on file  . Emotionally Abused: Not on file  . Physically Abused: Not on file  . Sexually Abused: Not on file    Medications:   Current Outpatient Medications on File Prior to Visit  Medication Sig Dispense Refill  . acetaminophen (TYLENOL) 325 MG tablet Take 2 tablets (650 mg total) by mouth every 6 (six) hours as needed for mild pain (or Fever >/= 101).    Marland Kitchen amLODipine (NORVASC) 10 MG tablet Take 1 tablet (10 mg total) by mouth daily. 90 tablet 1  . amoxicillin (AMOXIL) 500 MG tablet Take 4 tablets (2,000mg ) one hour prior to all dental visits. 28 tablet 0  . aspirin EC 81 MG tablet Take 81 mg by mouth daily.    . Calcium Carb-Cholecalciferol (CALCIUM 600 + D PO) Take 600 mg by mouth 3 (three) times a week.     . Cholecalciferol (VITAMIN D3) 5000 UNITS CAPS Take 5,000 Units by mouth daily.     Marland Kitchen denosumab (PROLIA) 60 MG/ML SOLN injection Inject 60 mg into the skin every 6 (six) months. Administer in upper arm, thigh, or abdomen    . EPINEPHrine 0.3 mg/0.3 mL IJ SOAJ injection Inject 0.3 mg into the muscle as needed for anaphylaxis.   12  . furosemide (LASIX) 20 MG tablet DAILY AS NEEDED FOR SWELLING 30 tablet 3  . loratadine (CLARITIN) 10 MG tablet Take 10 mg by mouth daily as needed for allergies.     . magnesium oxide (MAG-OX) 400 MG tablet Take 1 tablet by mouth daily.    . metoprolol succinate (TOPROL-XL) 25 MG 24 hr tablet TAKE 1 TABLET BY MOUTH EVERY DAY 90 tablet 2  . Multiple Vitamin (MULTIVITAMIN WITH MINERALS) TABS tablet Take 1 tablet by mouth daily.    . rosuvastatin (CRESTOR) 40 MG tablet TAKE 1 TABLET BY MOUTH EVERY DAY 90 tablet 3  . umeclidinium bromide (INCRUSE ELLIPTA) 62.5 MCG/INH AEPB Inhale 1 puff into the lungs daily. 30 each 3  . VENTOLIN HFA 108 (90 Base) MCG/ACT inhaler TAKE 2 PUFFS BY MOUTH EVERY 6 HOURS AS NEEDED FOR WHEEZE OR SHORTNESS OF BREATH 18 g 1  . [DISCONTINUED] Aclidinium Bromide (TUDORZA PRESSAIR) 400 MCG/ACT AEPB Inhale 1 puff into the lungs daily. 1  each 3   No current facility-administered medications on file prior to visit.    Allergies:  No Known Allergies   Vitals Today's Vitals   09/19/20 1241  BP: 125/66  Pulse: 74  Weight: 165 lb (74.8 kg)  Height: 5\' 6"  (1.676 m)   Body mass index is 26.63 kg/m.    Physical Exam General: Frail pleasant elderly Caucasian lady, seated, in no evident distress Head: head normocephalic and atraumatic.   Neck: supple with no carotid or supraclavicular bruits Cardiovascular: regular rate and rhythm, soft ejection murmur over the base. Musculoskeletal: no deformity Skin:  no rash/petichiae Vascular:  Normal pulses all extremities  Neurologic Exam Mental Status: Awake and fully alert. Oriented to place and time. Recent and remote memory intact. Attention span, concentration and fund of knowledge appropriate. Mood and affect appropriate.  Cranial Nerves: Pupils equal, briskly reactive to light. Extraocular movements full without nystagmus. Visual fields full to confrontation. Hearing intact. Facial sensation intact. Face, tongue, palate moves normally and symmetrically.  Motor: Normal bulk and tone. Normal strength in all tested extremity muscles. Sensory.: intact to touch , pinprick , position and vibratory sensation.  Coordination: Rapid alternating movements normal in all extremities. Finger-to-nose and heel-to-shin performed accurately bilaterally. Gait and Station: Arises from chair without difficulty. Stance is normal. Gait demonstrates normal stride length and balance without use of assistive device Reflexes: 1+ and symmetric. Toes downgoing.       ASSESSMENT: 79 year old Caucasian lady with longstanding recurrent episodes of headache and transient blurred vision possibly migrainous in nature.  Headaches have been stable occurring once every 1 to 2 months.  New diagnosis of paroxysmal atrial fibrillation but low CHA2DS2-VASc score and placed on aspirin alone without indication for  anticoagulation.  History of carotid stenosis with recent carotid ultrasound 1-39% bilaterally.     PLAN: -Migraines intermittently associated with blurred vision -stable without worsening or increased frequency -Continue aspirin 81 mg daily and Crestor for stroke prevention -Follows with cardiology for routine monitoring of carotid stenosis and plans on undergoing CTA 02/2021 Continue to follow with PCP and cardiology regularly for chronic disease management and monitoring/management of HTN with BP goal<130/90 and HLD with LDL goal<70   Overall stable from a neurological standpoint and recommend follow-up on an as-needed basis  CC:  [place] provider: Dr. Altamese Dilling, Alda Berthold, MD    I spent 25 minutes of face-to-face and non-face-to-face time with patient.  This included previsit chart review, lab review, study review, order entry, electronic health record documentation, patient education regarding history of migraine headaches, carotid ultrasound and future surveillance monitoring with cardiology and answered all other questions to patient satisfaction    Frann Rider, [facility]  [facility] [address] [place], [place] [address]  Phone [phone] Fax [phone] Note: This document was prepared with digital dictation and possible smart phrase technology. Any transcriptional errors that result from this process are unintentional.

## 2020-09-20 ENCOUNTER — Ambulatory Visit: Payer: Medicare Other | Admitting: Pharmacist

## 2020-09-20 ENCOUNTER — Other Ambulatory Visit: Payer: Self-pay

## 2020-09-20 DIAGNOSIS — E7800 Pure hypercholesterolemia, unspecified: Secondary | ICD-10-CM

## 2020-09-20 DIAGNOSIS — J449 Chronic obstructive pulmonary disease, unspecified: Secondary | ICD-10-CM

## 2020-09-20 DIAGNOSIS — I10 Essential (primary) hypertension: Secondary | ICD-10-CM

## 2020-09-20 NOTE — Chronic Care Management (AMB) (Signed)
Chronic Care Management Pharmacy  Name: Dominique [name]  MRN: [id number] DOB: Jan 01, 1941  Chief Complaint/ HPI  Dominique [name],  79 y.o. , female presents for their Initial CCM visit with the clinical pharmacist via telephone.  PCP : Colon Branch, MD  Their chronic conditions include: Hypertension, Hyperlipidemia/CAD, COPD, Osteoporosis  Office Visits: 08/23/20: Visit w/ Dr. Larose Kells - Prolia given. New inhaler prescribed due to chronic DOE.  Consult Visit: 09/19/20: Neurology visit w/ Frann Rider, NP - No med changes noted.   Medications: Outpatient Encounter Medications as of 09/20/2020  Medication Sig Note  . acetaminophen (TYLENOL) 325 MG tablet Take 2 tablets (650 mg total) by mouth every 6 (six) hours as needed for mild pain (or Fever >/= 101). 02/18/2020: PRN  . amLODipine (NORVASC) 10 MG tablet Take 1 tablet (10 mg total) by mouth daily.   Marland Kitchen aspirin EC 81 MG tablet Take 81 mg by mouth daily.   . Cholecalciferol (VITAMIN D3) 5000 UNITS CAPS Take 5,000 Units by mouth daily.    Marland Kitchen denosumab (PROLIA) 60 MG/ML SOLN injection Inject 60 mg into the skin every 6 (six) months. Administer in upper arm, thigh, or abdomen   . furosemide (LASIX) 20 MG tablet DAILY AS NEEDED FOR SWELLING   . magnesium oxide (MAG-OX) 400 MG tablet Take 1 tablet by mouth daily.   . metoprolol succinate (TOPROL-XL) 25 MG 24 hr tablet TAKE 1 TABLET BY MOUTH EVERY Marks Scalera   . Multiple Vitamin (MULTIVITAMIN WITH MINERALS) TABS tablet Take 1 tablet by mouth daily.   . rosuvastatin (CRESTOR) 40 MG tablet TAKE 1 TABLET BY MOUTH EVERY Tyishia Aune   . umeclidinium bromide (INCRUSE ELLIPTA) 62.5 MCG/INH AEPB Inhale 1 puff into the lungs daily. 09/20/2020: Has not started yet. Had questions  . VENTOLIN HFA 108 (90 Base) MCG/ACT inhaler TAKE 2 PUFFS BY MOUTH EVERY 6 HOURS AS NEEDED FOR WHEEZE OR SHORTNESS OF BREATH   . amoxicillin (AMOXIL) 500 MG tablet Take 4 tablets (2,098m) one hour prior to all dental visits. 02/18/2020: PRN  dental  . Calcium Carb-Cholecalciferol (CALCIUM 600 + D PO) Take 600 mg by mouth 3 (three) times a week.    .Marland KitchenEPINEPHrine 0.3 mg/0.3 mL IJ SOAJ injection Inject 0.3 mg into the muscle as needed for anaphylaxis.  02/18/2020: PRN  . loratadine (CLARITIN) 10 MG tablet Take 10 mg by mouth daily as needed for allergies.    . [DISCONTINUED] Aclidinium Bromide (TUDORZA PRESSAIR) 400 MCG/ACT AEPB Inhale 1 puff into the lungs daily.    No facility-administered encounter medications on file as of 09/20/2020.   SDOH Screenings   Alcohol Screen:   . Last Alcohol Screening Score (AUDIT): Not on file  Depression (PHQ2-9): Low Risk   . PHQ-2 Score: 0  Financial Resource Strain: Low Risk   . Difficulty of Paying Living Expenses: Not hard at all  Food Insecurity: No Food Insecurity  . Worried About RCharity fundraiserin the Last Year: Never true  . Ran Out of Food in the Last Year: Never true  Housing: Low Risk   . Last Housing Risk Score: 0  Physical Activity:   . Days of Exercise per Week: Not on file  . Minutes of Exercise per Session: Not on file  Social Connections:   . Frequency of Communication with Friends and Family: Not on file  . Frequency of Social Gatherings with Friends and Family: Not on file  . Attends Religious Services: Not on file  . Active  Member of Clubs or Organizations: Not on file  . Attends Archivist Meetings: Not on file  . Marital Status: Not on file  Stress:   . Feeling of Stress : Not on file  Tobacco Use: Medium Risk  . Smoking Tobacco Use: Former Smoker  . Smokeless Tobacco Use: Never Used  Transportation Needs: No Transportation Needs  . Lack of Transportation (Medical): No  . Lack of Transportation (Non-Medical): No     Current Diagnosis/Assessment:  Goals Addressed            This Visit's Progress   . Chronic Care Management Pharmacy Care Plan       CARE PLAN ENTRY (see longitudinal plan of care for additional care plan  information)  Current Barriers:  . Chronic Disease Management support, education, and care coordination needs related to Hypertension, Hyperlipidemia/CAD, COPD, Osteoporosis   Hypertension BP Readings from Last 3 Encounters:  09/19/20 125/66  09/02/20 130/76  08/23/20 118/75   . Pharmacist Clinical Goal(s): o Over the next 180 days, patient will work with PharmD and providers to maintain BP goal <130/80 . Current regimen:  . Amlodipine 47m daily AM . Metoprolol Succinate 243mdaily . Interventions: o Discussed BP goal . Patient self care activities - Over the next 180 days, patient will: o Maintain hypertension medication regimen.   Hyperlipidemia Lab Results  Component Value Date/Time   LDLCALC 53 02/18/2020 11:02 AM   LDLCALC 59 05/28/2019 05:40 PM   . Pharmacist Clinical Goal(s): o Over the next 180 days, patient will work with PharmD and providers to  LDL goal < 70 . Current regimen:  . Aspirin 8169maily . Rosuvastatin 45m39mily . Magnesium Oxide 400mg34mly . Patient self care activities - Over the next 180 days, patient will: o Maintain cholesterol medication regimen.   COPD . Pharmacist Clinical Goal(s) o Over the next 180 days, patient will work with PharmD and providers to reduce symptoms associated with COPD . Current regimen:   Incruse Ellipta 62.5mcg 77male 1 puff daily   Ventolin HFA as needed . Interventions: o Discussed differences between Ventolin and Incruse Ellipta - Ventolin = Rescue Inhaler = Use as needed for shortness of breath - Incruse Ellipta = Maintenance Inhaler = Use daily to prevent lung function decline . Patient self care activities - Over the next 180 days, patient will: o Maintain COPD Medication regimen  Medication management . Pharmacist Clinical Goal(s): o Over the next 180 days, patient will work with PharmD and providers to maintain optimal medication adherence . [facility] . Interventions o Comprehensive  medication review performed. o Continue current medication management strategy . Patient self care activities - Over the next 180 days, patient will: o Focus on medication adherence by filling and taking medications appropriately  o Take medications as prescribed o Report any questions or concerns to PharmD and/or provider(s)  Initial goal documentation        Hypertension   BP goal is:  <130/80  Office blood pressures are  BP Readings from Last 3 Encounters:  09/19/20 125/66  09/02/20 130/76  08/23/20 118/75   Patient checks BP at home infrequently Patient home BP readings are ranging: Unable to assess  Patient has failed these meds in the past: benazepril (listed in D/C meds. No apparent reason for D/C), valsartan (hip pain) Patient is currently controlled on the following medications:  . Amlodipine 10mg d43m AM . Metoprolol Succinate 25mg da6m We discussed BP goal  Plan -Continue current  medications   Hyperlipidemia/CAD   LDL goal < 70  Last lipids Lab Results  Component Value Date   CHOL 136 02/18/2020   HDL 64.30 02/18/2020   LDLCALC 53 02/18/2020   TRIG 95.0 02/18/2020   CHOLHDL 2 02/18/2020   Hepatic Function Latest Ref Rng & Units 02/18/2020 06/08/2019 06/02/2019  Total Protein 6.5 - 8.1 g/dL - 6.8 6.9  Albumin 3.5 - 5.0 g/dL - 3.3(L) 3.5  AST 0 - 37 U/L 26 46(H) 89(H)  ALT 0 - 35 U/L 20 35 57(H)  Alk Phosphatase 38 - 126 U/L - 71 88  Total Bilirubin 0.3 - 1.2 mg/dL - 0.5 0.7  Bilirubin, Direct 0.00 - 0.40 mg/dL - - -     The ASCVD Risk score Mikey Bussing DC Jr., et al., 2013) failed to calculate for the following reasons:   The 2013 ASCVD risk score is only valid for ages 73 to 58   Patient has failed these meds in past: pravastatin (listed in D/C meds. No apparent reason for D/C) Patient is currently controlled on the following medications:  . Aspirin 24m daily . Rosuvastatin 412mdaily . Magnesium Oxide 40054maily  Plan -Continue current  medications  COPD / Tobacco   Last spirometry score: 12/09/2018 FVC: 1.77 (62%) FEV1: 1.06 (50%) FEV1/FVC: 60%  Gold Grade: Gold 2 (FEV1 50-79%) Current COPD Classification:  A (low sx, <2 exacerbations/yr)  Eosinophil count:   Lab Results  Component Value Date/Time   EOSPCT 5.6 (H) 02/18/2020 11:02 AM  %                               Eos (Absolute):  Lab Results  Component Value Date/Time   EOSABS 0.4 02/18/2020 11:02 AM   EOSABS 0.5 (H) 11/13/2018 01:10 PM    Tobacco Status:  Social History   Tobacco Use  Smoking Status Former Smoker  . Packs/Norleen Xie: 1.50  . Years: 40.00  . Pack years: 60.00  . Types: Cigarettes  . Quit date: 03/19/2004  . Years since quitting: 16.5  Smokeless Tobacco Never Used    Patient has failed these meds in past: None noted  Patient is currently controlled on the following medications:  Incruse Ellipta 62.5mc71mnhale 1 puff daily   Ventolin HFA as needed Using maintenance inhaler regularly? No (but she plans to start) Frequency of rescue inhaler use:  infrequently  She has not felt SOB the last two days, so she's not sure if she wants to start new inhaler. She is considering starting the inhaler.  Pt reported Ventolin inhaler was not a rescue inhaler Discussed how Incruse differs from Ventolin and the dosing for each.  Plan -Consider starting Incruse Ellipta. This medication will help reduce risk of lung function decrease -Continue current medications   Osteoporosis   Last DEXA Scan: 09/02/2015  T-Score lumbar spine: -0.9  T-Score forearm radius: 0.2  Vit D, 25-Hydroxy  Date Value Ref Range Status  10/14/2015 64 30 - 100 ng/mL Final    Comment:    Vitamin D Status           25-OH Vitamin D        Deficiency                <20 ng/mL        Insufficiency         20 - 29 ng/mL        Optimal             >  or = 30 ng/mL   For 25-OH Vitamin D testing on patients on D2-supplementation and patients for whom quantitation of D2 and D3  fractions is required, the QuestAssureD 25-OH VIT D, (D2,D3), LC/MS/MS is recommended: order code [phone] (patients > 2 yrs).      Patient has failed these meds in past: None noted  Patient is currently controlled on the following medications:  Marland Kitchen Vitamin D 5000 units 3 days per week . Calcium 656m 3 days per week . Prolia 648mevery 6 months  Does not want to have another DEXA. Feels she is doing the most she can to treat her osteoporosis and does not see the point of additional scans.   Plan -Continue current medications   Vaccines   Reviewed and discussed patient's vaccination history.   Patient is up to date on vaccines  Immunization History  Administered Date(s) Administered  . Fluad Quad(high Dose 65+) 07/14/2019, 08/23/2020  . Influenza, High Dose Seasonal PF 10/03/2016, 08/07/2018  . Influenza-Unspecified 09/06/2015, 07/18/2017  . Moderna SARS-COVID-2 Vaccination 12/01/2019, 12/31/2019, 07/05/2020  . Pneumococcal Conjugate-13 09/27/2014  . Pneumococcal Polysaccharide-23 10/29/2010  . Td 04/24/2016  . Zoster Recombinat (Shingrix) 10/18/2017, 03/04/2018    Medication Management   [facility]  [id number] [place]: [phone]: [phone] pill box? Yes Pt endorses never forgetting her medications  Miscellaneous Meds Acetaminophen - seldomly for headache Furosemide - has only taken once since receive Multivitamin Amoxicillin - before dental procedures Loratadine Epinephrine   We discussed: Discussed benefits of medication synchronization, packaging and delivery as well as enhanced pharmacist oversight with [facility]. Likes her regimen and driving to the pharmacy.  Plan  Continue current medication management strategy    Follow up:  6 month phone visit  KanDe BlanchharmD Clinical Pharmacist [phone]

## 2020-09-20 NOTE — Progress Notes (Signed)
I agree with the above plan 

## 2020-09-21 NOTE — Patient Instructions (Addendum)
Visit Information  Goals Addressed            This Visit's Progress   . Chronic Care Management Pharmacy Care Plan       CARE PLAN ENTRY (see longitudinal plan of care for additional care plan information)  Current Barriers:  . Chronic Disease Management support, education, and care coordination needs related to Hypertension, Hyperlipidemia/CAD, COPD, Osteoporosis   Hypertension BP Readings from Last 3 Encounters:  09/19/20 125/66  09/02/20 130/76  08/23/20 118/75   . Pharmacist Clinical Goal(s): o Over the next 180 days, patient will work with PharmD and providers to maintain BP goal <130/80 . Current regimen:  . Amlodipine 10mg daily AM . Metoprolol Succinate 25mg daily . Interventions: o Discussed BP goal . Patient self care activities - Over the next 180 days, patient will: o Maintain hypertension medication regimen.   Hyperlipidemia Lab Results  Component Value Date/Time   LDLCALC 53 02/18/2020 11:02 AM   LDLCALC 59 05/28/2019 05:40 PM   . Pharmacist Clinical Goal(s): o Over the next 180 days, patient will work with PharmD and providers to  LDL goal < 70 . Current regimen:  . Aspirin 81mg daily . Rosuvastatin 40mg daily . Magnesium Oxide 400mg daily . Patient self care activities - Over the next 180 days, patient will: o Maintain cholesterol medication regimen.   COPD . Pharmacist Clinical Goal(s) o Over the next 180 days, patient will work with PharmD and providers to reduce symptoms associated with COPD . Current regimen:   Incruse Ellipta 62.5mcg inhale 1 puff daily   Ventolin HFA as needed . Interventions: o Discussed differences between Ventolin and Incruse Ellipta - Ventolin = Rescue Inhaler = Use as needed for shortness of breath - Incruse Ellipta = Maintenance Inhaler = Use daily to prevent lung function decline . Patient self care activities - Over the next 180 days, patient will: o Maintain COPD Medication regimen  Medication  management . Pharmacist Clinical Goal(s): o Over the next 180 days, patient will work with PharmD and providers to maintain optimal medication adherence . [facility] . Interventions o Comprehensive medication review performed. o Continue current medication management strategy . Patient self care activities - Over the next 180 days, patient will: o Focus on medication adherence by filling and taking medications appropriately  o Take medications as prescribed o Report any questions or concerns to PharmD and/or provider(s)  Initial goal documentation        Ms. Ohnemus was given information about Chronic Care Management services today including:  1. CCM service includes personalized support from designated clinical staff supervised by her physician, including individualized plan of care and coordination with other care providers 2. 24/7 contact phone numbers for assistance for urgent and routine care needs. 3. Standard insurance, coinsurance, copays and deductibles apply for chronic care management only during months in which we provide at least 20 minutes of these services. Most insurances cover these services at 100%, however patients may be responsible for any copay, coinsurance and/or deductible if applicable. This service may help you avoid the need for more expensive face-to-face services. 4. Only one practitioner may furnish and bill the service in a calendar month. 5. The patient may stop CCM services at any time (effective at the end of the month) by phone call to the office staff.  Patient agreed to services and verbal consent obtained.   The patient verbalized understanding of instructions, educational materials, and care plan provided today and agreed to receive a   mailed copy of patient instructions, educational materials, and care plan.  Telephone follow up appointment with pharmacy team member scheduled for: 03/22/2021  Melvenia Beam Damarea Merkel, PharmD Clinical  Pharmacist [facility] [phone]   Healthy Eating Following a healthy eating pattern may help you to achieve and maintain a healthy body weight, reduce the risk of chronic disease, and live a long and productive life. It is important to follow a healthy eating pattern at an appropriate calorie level for your body. Your nutritional needs should be met primarily through food by choosing a variety of nutrient-rich foods. What are tips for following this plan? Reading food labels  Read labels and choose the following: ? Reduced or low sodium. ? Juices with 100% fruit juice. ? Foods with low saturated fats and high polyunsaturated and monounsaturated fats. ? Foods with whole grains, such as whole wheat, cracked wheat, brown rice, and wild rice. ? Whole grains that are fortified with folic acid. This is recommended for women who are pregnant or who want to become pregnant.  Read labels and avoid the following: ? Foods with a lot of added sugars. These include foods that contain brown sugar, corn sweetener, corn syrup, dextrose, fructose, glucose, high-fructose corn syrup, honey, invert sugar, lactose, malt syrup, maltose, molasses, raw sugar, sucrose, trehalose, or turbinado sugar.  Do not eat more than the following amounts of added sugar per Sanjuanita Condrey:  6 teaspoons (25 g) for women.  9 teaspoons (38 g) for men. ? Foods that contain processed or refined starches and grains. ? Refined grain products, such as white flour, degermed cornmeal, white bread, and white rice. Shopping  Choose nutrient-rich snacks, such as vegetables, whole fruits, and nuts. Avoid high-calorie and high-sugar snacks, such as potato chips, fruit snacks, and candy.  Use oil-based dressings and spreads on foods instead of solid fats such as butter, stick margarine, or cream cheese.  Limit pre-made sauces, mixes, and "instant" products such as flavored rice, instant noodles, and ready-made  pasta.  Try more plant-protein sources, such as tofu, tempeh, black beans, edamame, lentils, nuts, and seeds.  Explore eating plans such as the Mediterranean diet or vegetarian diet. Cooking  Use oil to saut or stir-fry foods instead of solid fats such as butter, stick margarine, or lard.  Try baking, boiling, grilling, or broiling instead of frying.  Remove the fatty part of meats before cooking.  Steam vegetables in water or broth. Meal planning   At meals, imagine dividing your plate into fourths: ? One-half of your plate is fruits and vegetables. ? One-fourth of your plate is whole grains. ? One-fourth of your plate is protein, especially lean meats, poultry, eggs, tofu, beans, or nuts.  Include low-fat dairy as part of your daily diet. Lifestyle  Choose healthy options in all settings, including home, work, school, restaurants, or stores.  Prepare your food safely: ? Wash your hands after handling raw meats. ? Keep food preparation surfaces clean by regularly washing with hot, soapy water. ? Keep raw meats separate from ready-to-eat foods, such as fruits and vegetables. ? Cook seafood, meat, poultry, and eggs to the recommended internal temperature. ? Store foods at safe temperatures. In general:  Keep cold foods at 47F (4.4C) or below.  Keep hot foods at 147F (60C) or above.  Keep your freezer at [facility] (-17.8C) or below.  Foods are no longer safe to eat when they have been between the temperatures of 40-147F (4.4-60C) for more than 2 hours. What foods  should I eat? Fruits Aim to eat 2 cup-equivalents of fresh, canned (in natural juice), or frozen fruits each Drayke Grabel. Examples of 1 cup-equivalent of fruit include 1 small apple, 8 large strawberries, 1 cup canned fruit,  cup dried fruit, or 1 cup 100% juice. Vegetables Aim to eat 2-3 cup-equivalents of fresh and frozen vegetables each Ellyson Rarick, including different varieties and colors. Examples of 1 cup-equivalent of  vegetables include 2 medium carrots, 2 cups raw, leafy greens, 1 cup chopped vegetable (raw or cooked), or 1 medium baked potato. Grains Aim to eat 6 ounce-equivalents of whole grains each Kasy Iannacone. Examples of 1 ounce-equivalent of grains include 1 slice of bread, 1 cup ready-to-eat cereal, 3 cups popcorn, or  cup cooked rice, pasta, or cereal. Meats and other proteins Aim to eat 5-6 ounce-equivalents of protein each Kassidy Frankson. Examples of 1 ounce-equivalent of protein include 1 egg, 1/2 cup nuts or seeds, or 1 tablespoon (16 g) peanut butter. A cut of meat or fish that is the size of a deck of cards is about 3-4 ounce-equivalents.  Of the protein you eat each week, try to have at least 8 ounces come from seafood. This includes salmon, trout, herring, and anchovies. Dairy Aim to eat 3 cup-equivalents of fat-free or low-fat dairy each Shem Plemmons. Examples of 1 cup-equivalent of dairy include 1 cup (240 mL) milk, 8 ounces (250 g) yogurt, 1 ounces (44 g) natural cheese, or 1 cup (240 mL) fortified soy milk. Fats and oils  Aim for about 5 teaspoons (21 g) per Alontae Chaloux. Choose monounsaturated fats, such as canola and olive oils, avocados, peanut butter, and most nuts, or polyunsaturated fats, such as sunflower, corn, and soybean oils, walnuts, pine nuts, sesame seeds, sunflower seeds, and flaxseed. Beverages  Aim for six 8-oz glasses of water per Jemiah Cuadra. Limit coffee to three to five 8-oz cups per Grisell Bissette.  Limit caffeinated beverages that have added calories, such as soda and energy drinks.  Limit alcohol intake to no more than 1 drink a Heaven Wandell for nonpregnant women and 2 drinks a Maggy Wyble for men. One drink equals 12 oz of beer (355 mL), 5 oz of wine (148 mL), or 1 oz of hard liquor (44 mL). Seasoning and other foods  Avoid adding excess amounts of salt to your foods. Try flavoring foods with herbs and spices instead of salt.  Avoid adding sugar to foods.  Try using oil-based dressings, sauces, and spreads instead of solid  fats. This information is based on general [place] nutrition guidelines. For more information, visit Build[employer].es. Exact amounts may vary based on your nutrition needs. Summary  A healthy eating plan may help you to maintain a healthy weight, reduce the risk of chronic diseases, and stay active throughout your life.  Plan your meals. Make sure you eat the right portions of a variety of nutrient-rich foods.  Try baking, boiling, grilling, or broiling instead of frying.  Choose healthy options in all settings, including home, work, school, restaurants, or stores. This information is not intended to replace advice given to you by your health care provider. Make sure you discuss any questions you have with your health care provider. Document Revised: 01/27/2018 Document Reviewed: 01/27/2018 Elsevier Patient Education  [place].

## 2020-10-02 ENCOUNTER — Other Ambulatory Visit: Payer: Self-pay | Admitting: Cardiovascular Disease

## 2020-10-07 ENCOUNTER — Ambulatory Visit
Admission: RE | Admit: 2020-10-07 | Discharge: 2020-10-07 | Disposition: A | Discharge status: Home / Self Care | Payer: Medicare Other | Source: Ambulatory Visit | Attending: Pulmonary Disease | Admitting: Pulmonary Disease

## 2020-10-07 DIAGNOSIS — I2510 Atherosclerotic heart disease of native coronary artery without angina pectoris: Secondary | ICD-10-CM | POA: Diagnosis not present

## 2020-10-07 DIAGNOSIS — R918 Other nonspecific abnormal finding of lung field: Secondary | ICD-10-CM

## 2020-10-07 DIAGNOSIS — J432 Centrilobular emphysema: Secondary | ICD-10-CM | POA: Diagnosis not present

## 2020-10-07 DIAGNOSIS — I712 Thoracic aortic aneurysm, without rupture: Secondary | ICD-10-CM | POA: Diagnosis not present

## 2020-10-07 DIAGNOSIS — I700 Atherosclerosis of aorta: Secondary | ICD-10-CM | POA: Diagnosis not present

## 2020-10-10 ENCOUNTER — Telehealth: Payer: Self-pay | Admitting: Pulmonary Disease

## 2020-10-10 NOTE — Telephone Encounter (Signed)
PCCM:  She has 2 lesions within the chest that will still require follow-up.  We can discuss next steps at her next office visi.  They are stable in size in comparison to previous CT imaging which is reassuring.  However, it does not exclude the possibility of slow-growing underlying malignancy.  Garner Nash, DO [facility] 10/10/2020 2:50 PM

## 2020-10-10 NOTE — Telephone Encounter (Signed)
Patient is requesting 12/10 CT chest results. Dr. Valeta Harms please advise, thanks!

## 2020-10-10 NOTE — Telephone Encounter (Signed)
Spoke with pt, aware of results/recs.  Nothing further needed at this time- will close encounter.   

## 2020-11-08 ENCOUNTER — Ambulatory Visit: Payer: Medicare Other | Admitting: Pulmonary Disease

## 2020-11-08 ENCOUNTER — Encounter: Payer: Self-pay | Admitting: Adult Health

## 2020-11-08 ENCOUNTER — Other Ambulatory Visit: Payer: Self-pay

## 2020-11-08 ENCOUNTER — Ambulatory Visit (INDEPENDENT_AMBULATORY_CARE_PROVIDER_SITE_OTHER): Payer: Medicare Other | Admitting: Adult Health

## 2020-11-08 DIAGNOSIS — J449 Chronic obstructive pulmonary disease, unspecified: Secondary | ICD-10-CM | POA: Diagnosis not present

## 2020-11-08 DIAGNOSIS — R918 Other nonspecific abnormal finding of lung field: Secondary | ICD-10-CM

## 2020-11-08 NOTE — Assessment & Plan Note (Addendum)
CT chest shows stable lung nodules with largest nodule measuring 2.9 cm. Long discussion with patient regarding current CT results.  Aware this may be a slow growing adenocarcinoma . Stable on CT chest . Discussed possible options with ongoing survelliance vs tissue sampling . Will proceed with CT chest in 6 months .  Case and films discussed in detail with Dr. Valeta Harms   Plan  Patient Instructions  Continue on INCRUSE 1 puff daily  CT chest w/out contrast in 6 months- lung nodule.  Follow up with Dr. Valeta Harms in 6 months and As needed

## 2020-11-08 NOTE — Patient Instructions (Signed)
Continue on INCRUSE 1 puff daily  CT chest w/out contrast in 6 months- lung nodule.  Follow up with Dr. Valeta Harms in 6 months and As needed

## 2020-11-08 NOTE — Progress Notes (Signed)
@Patient  ID: Dominique [name], female    DOB: Mar 11, 1941, 80 y.o.   MRN: [id number]  Chief Complaint  Patient presents with  . Follow-up    Referring provider: Colon Branch, MD  HPI: 80 year old female former smoker (60-pack-year) followed for COPD and lung nodules  TEST/EVENTS :  . CT chest 01/14/2020 showed a stable  subsolid posterior left upper lobe focus 2.4 x 1.3 cm distorting the left major fissure, a small medial LUL pleural based nodule, scattered small groundglass nodules largest 1.8 cm in the posterior right upper lobe.  PET scan done on 01/27/2020 showed that the very medial LUL nodule was hypermetabolic, no hypermetabolism noted in the other nodular foci.   repeat CT scan of the chest done on 03/21/2020 reviewed by me shows no significant change in the appearance, size of the nodules.  In particular no resolution of the clustered left upper lobe nodularity that was thought to possibly be inflammatory or infectious in nature.  PFT 12/09/2018 reviewed >> FEV1 1.06L  11/08/2020 Follow up : COPD and Lung nodules  Patient returns for a 82-month follow-up.  Patient has a heavy smoking history.  And has underlying COPD.  CT chest has shown pulmonary nodules.  Dating back to August 2018.  CT chest January 14, 2020 as above showed stable left upper lobe nodule 2.4 cm, small groundglass nodules largest 1.8 cm in the right upper lobe.  PET scan March 31 showed very medial left upper lobe nodule was hypermetabolic.  And no hypermetabolism noted in the other nodular foci.  Follow-up CT chest Mar 21, 2020 showed no significant change in appearance. Patient's case was discussed for possible tissue sampling but was felt difficult to reach through navigational bronchoscopy.  Patient was set up for a serial follow-up CT chest in 6 months.  CT chest was done October 08, 2020 that showed multiple tiny solid and larger subsolid nodules similar in size with no significant change.  Largest measuring 2.9 cm.   And diffuse emphysema.  Remains active, lives at home , drives does shopping and cleaning .  On Incruse inhaler . Some help with dyspnea . Gets winded with heavy activity .   No Known Allergies  Immunization History  Administered Date(s) Administered  . Fluad Quad(high Dose 65+) 07/14/2019, 08/23/2020  . Influenza, High Dose Seasonal PF 10/03/2016, 08/07/2018  . Influenza-Unspecified 09/06/2015, 07/18/2017  . Moderna Sars-Covid-2 Vaccination 12/01/2019, 12/31/2019, 07/05/2020  . Pneumococcal Conjugate-13 09/27/2014  . Pneumococcal Polysaccharide-23 10/29/2010  . Td 04/24/2016  . Zoster Recombinat (Shingrix) 10/18/2017, 03/04/2018    Past Medical History:  Diagnosis Date  . Atrial fibrillation ([place])    a. dx 03/2019 while in hospital with cholecystitis  . COPD (chronic obstructive pulmonary disease) ([place])   . Critical lower limb ischemia (HCC)    a. after TAVR developed ischemic L leg likely due to flap/closure, s/p emergent repair of left femoral artery with endarterectomy and Dacron patch angioplasty.  . Emphysema lung ([place])   . Hyperlipidemia    LDL goal < 70  . Hypertension   . Hypomagnesemia   . Normal coronary arteries 10/2018  . NSVT (nonsustained ventricular tachycardia) ([place]) 03/02/2019  . Pulmonary HTN ([place]) 06/05/2016   Moderate with PASP 78mmHg by echo 04/2016 likely Group 3 from COPD and possibly Group 2 from pulmonary venous HTN associated with moderate AS - f/u echo 12/2018 post TAVR showed normal RVSP  . Pulmonary nodules    seen on pre TAVR CT, needs 12 month CT  follow up  . 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 Sacral fracture ([place])   . Severe aortic stenosis    a. s/p TAVR 12/2018.  Marland Kitchen Thoracic ascending aortic aneurysm [facility])    needs yearly follow up    Tobacco History: Social History   Tobacco Use  Smoking Status Former Smoker  . Packs/day: 1.50   . Years: 40.00  . Pack years: 60.00  . Types: Cigarettes  . Quit date: 03/19/2004  . Years since quitting: 16.6  Smokeless Tobacco Never Used   Counseling given: Not Answered   Outpatient Medications Prior to Visit  Medication Sig Dispense Refill  . acetaminophen (TYLENOL) 325 MG tablet Take 2 tablets (650 mg total) by mouth every 6 (six) hours as needed for mild pain (or Fever >/= 101).    Marland Kitchen amLODipine (NORVASC) 10 MG tablet Take 1 tablet (10 mg total) by mouth daily. 90 tablet 1  . amoxicillin (AMOXIL) 500 MG tablet Take 4 tablets (2,000mg ) one hour prior to all dental visits. 28 tablet 0  . aspirin EC 81 MG tablet Take 81 mg by mouth daily.    . Calcium Carb-Cholecalciferol (CALCIUM 600 + D PO) Take 600 mg by mouth 3 (three) times a week.     . Cholecalciferol (VITAMIN D3) 5000 UNITS CAPS Take 5,000 Units by mouth daily.     Marland Kitchen denosumab (PROLIA) 60 MG/ML SOLN injection Inject 60 mg into the skin every 6 (six) months. Administer in upper arm, thigh, or abdomen    . EPINEPHrine 0.3 mg/0.3 mL IJ SOAJ injection Inject 0.3 mg into the muscle as needed for anaphylaxis.   12  . furosemide (LASIX) 20 MG tablet DAILY AS NEEDED FOR SWELLING 30 tablet 3  . loratadine (CLARITIN) 10 MG tablet Take 10 mg by mouth daily as needed for allergies.     . magnesium oxide (MAG-OX) 400 MG tablet TAKE 1 TABLET (400 MG TOTAL) BY MOUTH DAILY. PT OVERDUE FOR [place] OV PLEASE CALL FOR APPT 90 tablet 3  . metoprolol succinate (TOPROL-XL) 25 MG 24 hr tablet TAKE 1 TABLET BY MOUTH EVERY DAY 90 tablet 2  . Multiple Vitamin (MULTIVITAMIN WITH MINERALS) TABS tablet Take 1 tablet by mouth daily.    . rosuvastatin (CRESTOR) 40 MG tablet TAKE 1 TABLET BY MOUTH DAILY 90 tablet 3  . umeclidinium bromide (INCRUSE ELLIPTA) 62.5 MCG/INH AEPB Inhale 1 puff into the lungs daily. 30 each 3  . VENTOLIN HFA 108 (90 Base) MCG/ACT inhaler TAKE 2 PUFFS BY MOUTH EVERY 6 HOURS AS NEEDED FOR WHEEZE OR SHORTNESS OF BREATH 18 g 1   No  facility-administered medications prior to visit.     Review of Systems:   Constitutional:   No  weight loss, night sweats,  Fevers, chills, fatigue, or  lassitude.  HEENT:   No headaches,  Difficulty swallowing,  Tooth/dental problems, or  Sore throat,                No sneezing, itching, ear ache, nasal congestion, post nasal drip,   CV:  No chest pain,  Orthopnea, PND, swelling in lower extremities, anasarca, dizziness, palpitations, syncope.   GI  No heartburn, indigestion, abdominal pain, nausea, vomiting, diarrhea, change in bowel habits, loss of appetite, bloody stools.   Resp: No shortness of breath with exertion or at rest.  No excess mucus, no productive cough,  No non-productive  cough,  No coughing up of blood.  No change in color of mucus.  No wheezing.  No chest wall deformity  Skin: no rash or lesions.  GU: no dysuria, change in color of urine, no urgency or frequency.  No flank pain, no hematuria   MS:  No joint pain or swelling.  No decreased range of motion.  No back pain.    Physical Exam  BP (!) 116/50 (BP Location: Left Arm, Patient Position: Sitting, Cuff Size: Normal)   Pulse 83   Temp 98 F (36.7 C) (Temporal)   Ht 5\' 6"  (1.676 m)   Wt 169 lb 9.6 oz (76.9 kg)   SpO2 92%   BMI 27.37 kg/m   GEN: A/Ox3; pleasant , NAD, well nourished    HEENT:  [place]/AT,   , NOSE-clear, THROAT-clear, no lesions, no postnasal drip or exudate noted.   NECK:  Supple w/ fair ROM; no JVD; normal carotid impulses w/o bruits; no thyromegaly or nodules palpated; no lymphadenopathy.    RESP  Clear  P & A; w/o, wheezes/ rales/ or rhonchi. no accessory muscle use, no dullness to percussion  CARD:  RRR, no m/r/g, no peripheral edema, pulses intact, no cyanosis or clubbing.  GI:   Soft & nt; nml bowel sounds; no organomegaly or masses detected.   Musco: Warm bil, no deformities or joint swelling noted.   Neuro: alert, no focal deficits noted.    Skin: Warm, no lesions or  rashes    Lab Results:  CBC    BNP  ProBNP No results found for: PROBNP  Imaging: No results found.    PFT Results Latest Ref Rng & Units 12/09/2018 06/18/2016  FVC-Pre L 1.77 2.08  FVC-Predicted Pre % 62 72  FVC-Post L 2.17 2.35  FVC-Predicted Post % 77 81  Pre FEV1/FVC % % 60 53  Post FEV1/FCV % % 49 53  FEV1-Pre L 1.06 1.11  FEV1-Predicted Pre % 50 51  FEV1-Post L 1.07 1.25  DLCO uncorrected ml/min/mmHg 8.82 10.71  DLCO UNC% % 45 41  DLVA Predicted % 63 60  TLC L 5.43 4.69  TLC % Predicted % 104 90  RV % Predicted % 145 99    No results found for: NITRICOXIDE      Assessment & Plan:   COPD GOLD II COPD appears stable.  Continue on current regimen with Incruse  Plan  Patient Instructions  Continue on INCRUSE 1 puff daily  CT chest w/out contrast in 6 months- lung nodule.  Follow up with Dr. Valeta Harms in 6 months and As needed       Multiple lung nodules CT chest shows stable lung nodules with largest nodule measuring 2.9 cm. Long discussion with patient regarding current CT results.  Aware this may be a slow growing adenocarcinoma . Stable on CT chest . Discussed possible options with ongoing survelliance vs tissue sampling . Will proceed with CT chest in 6 months .  Case and films discussed in detail with Dr. Valeta Harms   Plan  Patient Instructions  Continue on INCRUSE 1 puff daily  CT chest w/out contrast in 6 months- lung nodule.  Follow up with Dr. Valeta Harms in 6 months and As needed           Rexene Edison, NP 11/08/2020

## 2020-11-08 NOTE — Assessment & Plan Note (Signed)
COPD appears stable.  Continue on current regimen with Incruse  Plan  Patient Instructions  Continue on INCRUSE 1 puff daily  CT chest w/out contrast in 6 months- lung nodule.  Follow up with Dr. Valeta Harms in 6 months and As needed

## 2020-11-12 NOTE — Progress Notes (Signed)
PCCM: thanks for seeing her and discussing image follow up plan Garner Nash, DO [facility] 11/12/2020 5:29 PM

## 2020-11-21 ENCOUNTER — Telehealth: Payer: Self-pay | Admitting: Internal Medicine

## 2020-11-21 MED ORDER — AMLODIPINE BESYLATE 10 MG PO TABS: 10.0000 mg | ORAL_TABLET | Freq: Every day | ORAL | 1 refills | Status: DC

## 2020-11-21 NOTE — Telephone Encounter (Signed)
Rx faxed to [facility] 

## 2020-11-21 NOTE — Telephone Encounter (Signed)
E-scribing down. Rx printed, awaiting MD signature.

## 2020-12-14 DIAGNOSIS — H04123 Dry eye syndrome of bilateral lacrimal glands: Secondary | ICD-10-CM | POA: Diagnosis not present

## 2020-12-14 DIAGNOSIS — H10413 Chronic giant papillary conjunctivitis, bilateral: Secondary | ICD-10-CM | POA: Diagnosis not present

## 2020-12-20 ENCOUNTER — Telehealth: Payer: Self-pay | Admitting: Pharmacist

## 2020-12-20 NOTE — Progress Notes (Addendum)
Chronic Care Management Pharmacy Assistant   Name: Dominique [name]  MRN: [id number] DOB: 17-Dec-1940  Reason for Encounter: PAP  PCP : Colon Branch, MD  Allergies:  No Known Allergies  Medications: Outpatient Encounter Medications as of 12/20/2020  Medication Sig Note   acetaminophen (TYLENOL) 325 MG tablet Take 2 tablets (650 mg total) by mouth every 6 (six) hours as needed for mild pain (or Fever >/= 101). 02/18/2020: PRN   amLODipine (NORVASC) 10 MG tablet Take 1 tablet (10 mg total) by mouth daily.    amoxicillin (AMOXIL) 500 MG tablet Take 4 tablets (2,000mg ) one hour prior to all dental visits. 02/18/2020: PRN dental   aspirin EC 81 MG tablet Take 81 mg by mouth daily.    Calcium Carb-Cholecalciferol (CALCIUM 600 + D PO) Take 600 mg by mouth 3 (three) times a week.     Cholecalciferol (VITAMIN D3) 5000 UNITS CAPS Take 5,000 Units by mouth daily.     denosumab (PROLIA) 60 MG/ML SOLN injection Inject 60 mg into the skin every 6 (six) months. Administer in upper arm, thigh, or abdomen    EPINEPHrine 0.3 mg/0.3 mL IJ SOAJ injection Inject 0.3 mg into the muscle as needed for anaphylaxis.  02/18/2020: PRN   furosemide (LASIX) 20 MG tablet DAILY AS NEEDED FOR SWELLING    loratadine (CLARITIN) 10 MG tablet Take 10 mg by mouth daily as needed for allergies.     magnesium oxide (MAG-OX) 400 MG tablet TAKE 1 TABLET (400 MG TOTAL) BY MOUTH DAILY. PT OVERDUE FOR [place] OV PLEASE CALL FOR APPT    metoprolol succinate (TOPROL-XL) 25 MG 24 hr tablet TAKE 1 TABLET BY MOUTH EVERY DAY    Multiple Vitamin (MULTIVITAMIN WITH MINERALS) TABS tablet Take 1 tablet by mouth daily.    rosuvastatin (CRESTOR) 40 MG tablet TAKE 1 TABLET BY MOUTH DAILY    umeclidinium bromide (INCRUSE ELLIPTA) 62.5 MCG/INH AEPB Inhale 1 puff into the lungs daily. 09/20/2020: Has not started yet. Had questions   VENTOLIN HFA 108 (90 Base) MCG/ACT inhaler TAKE 2 PUFFS BY MOUTH EVERY 6 HOURS AS NEEDED FOR WHEEZE OR SHORTNESS OF  BREATH    [DISCONTINUED] Aclidinium Bromide (TUDORZA PRESSAIR) 400 MCG/ACT AEPB Inhale 1 puff into the lungs daily.    No facility-administered encounter medications on file as of 12/20/2020.    Current Diagnosis: Patient Active Problem List   Diagnosis Date Noted   Carotid stenosis, bilateral 03/23/2020   Osteoporosis 02/20/2020   Atypical migraine 10/20/2019   NSVT (nonsustained ventricular tachycardia) ([place]) 03/02/2019   Ischemic leg 01/01/2019   Thoracic ascending aortic aneurysm ([place]) 12/30/2018   S/P TAVR (transcatheter aortic valve replacement) 12/30/2018   CAD (coronary artery disease), native coronary artery    Multiple lung nodules 06/15/2016   Severe aortic stenosis    PCP NOTES >>>>>>> 10/15/2015   Vitamin D deficiency 10/14/2015   Sacral fracture ([place])    Dyspnea 04/12/2015   History of colonic polyps 03/30/2015   Hypocalcemia 12/27/2014   Annual physical exam 09/27/2014   Essential hypertension 03/19/2014   Hyperlipidemia 03/19/2014   COPD GOLD II 03/19/2014    Goals Addressed   None    New patient assistance application form filled out to [place] for [place]. Waiting for patient and provider to complete and sign documentation. Called patient to inquire if they wanted the application mailed to them or if they wanted to come into the office. Patient is required to sign application and to bring/have proof  of income. She stated she would be willing to come into office to bring proof of income and sign application once the form arrived at her home, she would like it mailed to their residence address [place] [address] .   Follow-Up:  Pharmacist Review   Charlann Lange, RMA Clinical Pharmacist Assistant [phone]  4 minutes spent in review, coordination, and documentation.  Reviewed by: Beverly Milch, PharmD Clinical Pharmacist [facility] [phone]

## 2020-12-20 NOTE — Progress Notes (Addendum)
Chronic Care Management Pharmacy Assistant   Name: Dominique [name]  MRN: [id number] DOB: 04/03/1941  Reason for Encounter: General Disease State Call  Patient Questions:  1.  Have you seen any other providers since your last visit? Yes   2.  Any changes in your medicines or health?   PCP : Colon Branch, MD   Their chronic conditions include: Hypertension, Hyperlipidemia/CAD, COPD, Osteoporosis.  Office Visits: None since 09/20/20  Consults: 11/08/20 Pulmonology [place], Fonnie Mu, NP. Follow up on COPD. No medication changes.   Allergies:  No Known Allergies  Medications: Outpatient Encounter Medications as of 12/20/2020  Medication Sig Note   acetaminophen (TYLENOL) 325 MG tablet Take 2 tablets (650 mg total) by mouth every 6 (six) hours as needed for mild pain (or Fever >/= 101). 02/18/2020: PRN   amLODipine (NORVASC) 10 MG tablet Take 1 tablet (10 mg total) by mouth daily.    amoxicillin (AMOXIL) 500 MG tablet Take 4 tablets (2,000mg ) one hour prior to all dental visits. 02/18/2020: PRN dental   aspirin EC 81 MG tablet Take 81 mg by mouth daily.    Calcium Carb-Cholecalciferol (CALCIUM 600 + D PO) Take 600 mg by mouth 3 (three) times a week.     Cholecalciferol (VITAMIN D3) 5000 UNITS CAPS Take 5,000 Units by mouth daily.     denosumab (PROLIA) 60 MG/ML SOLN injection Inject 60 mg into the skin every 6 (six) months. Administer in upper arm, thigh, or abdomen    EPINEPHrine 0.3 mg/0.3 mL IJ SOAJ injection Inject 0.3 mg into the muscle as needed for anaphylaxis.  02/18/2020: PRN   furosemide (LASIX) 20 MG tablet DAILY AS NEEDED FOR SWELLING    loratadine (CLARITIN) 10 MG tablet Take 10 mg by mouth daily as needed for allergies.     magnesium oxide (MAG-OX) 400 MG tablet TAKE 1 TABLET (400 MG TOTAL) BY MOUTH DAILY. PT OVERDUE FOR [place] OV PLEASE CALL FOR APPT    metoprolol succinate (TOPROL-XL) 25 MG 24 hr tablet TAKE 1 TABLET BY MOUTH EVERY DAY    Multiple Vitamin (MULTIVITAMIN  WITH MINERALS) TABS tablet Take 1 tablet by mouth daily.    rosuvastatin (CRESTOR) 40 MG tablet TAKE 1 TABLET BY MOUTH DAILY    umeclidinium bromide (INCRUSE ELLIPTA) 62.5 MCG/INH AEPB Inhale 1 puff into the lungs daily. 09/20/2020: Has not started yet. Had questions   VENTOLIN HFA 108 (90 Base) MCG/ACT inhaler TAKE 2 PUFFS BY MOUTH EVERY 6 HOURS AS NEEDED FOR WHEEZE OR SHORTNESS OF BREATH    [DISCONTINUED] Aclidinium Bromide (TUDORZA PRESSAIR) 400 MCG/ACT AEPB Inhale 1 puff into the lungs daily.    No facility-administered encounter medications on file as of 12/20/2020.    Current Diagnosis: Patient Active Problem List   Diagnosis Date Noted   Carotid stenosis, bilateral 03/23/2020   Osteoporosis 02/20/2020   Atypical migraine 10/20/2019   NSVT (nonsustained ventricular tachycardia) ([place]) 03/02/2019   Ischemic leg 01/01/2019   Thoracic ascending aortic aneurysm ([place]) 12/30/2018   S/P TAVR (transcatheter aortic valve replacement) 12/30/2018   CAD (coronary artery disease), native coronary artery    Multiple lung nodules 06/15/2016   Severe aortic stenosis    PCP NOTES >>>>>>> 10/15/2015   Vitamin D deficiency 10/14/2015   Sacral fracture ([place])    Dyspnea 04/12/2015   History of colonic polyps 03/30/2015   Hypocalcemia 12/27/2014   Annual physical exam 09/27/2014   Essential hypertension 03/19/2014   Hyperlipidemia 03/19/2014   COPD GOLD II 03/19/2014  Goals Addressed   None    GEN Call: Patient stated she eats about 2-3 meals daily. She stated she tries to get healthy food like more vegetables. She stated she is trying to drink more water, she stated she drinks at least 3 glasses a day as well as 2 cups of decaf coffee daily. She stated she has gained some weight in the at least few months. The patient stated she does not do any exercising but she does do things around the house like cooking and cleaning. Patent is concerned about the price of her Incruse Ellpta she stated it  cost her about $300 dollars. I informed the patient that I would look into starting a patient assistance form for this inhaler and update her on the information.   Follow-Up:  Pharmacist Review   Charlann Lange, RMA Clinical Pharmacist Assistant [phone]  10 minutes spent in review, coordination, and documentation.  Reviewed by: Beverly Milch, PharmD Clinical Pharmacist [facility] [phone]

## 2020-12-27 ENCOUNTER — Ambulatory Visit (INDEPENDENT_AMBULATORY_CARE_PROVIDER_SITE_OTHER): Payer: Medicare Other | Admitting: Internal Medicine

## 2020-12-27 ENCOUNTER — Other Ambulatory Visit: Payer: Self-pay

## 2020-12-27 VITALS — BP 128/72 | HR 71 | Temp 97.9°F | Ht 66.0 in | Wt 167.0 lb

## 2020-12-27 DIAGNOSIS — Z23 Encounter for immunization: Secondary | ICD-10-CM

## 2020-12-27 DIAGNOSIS — I10 Essential (primary) hypertension: Secondary | ICD-10-CM | POA: Diagnosis not present

## 2020-12-27 DIAGNOSIS — E7800 Pure hypercholesterolemia, unspecified: Secondary | ICD-10-CM

## 2020-12-27 DIAGNOSIS — J449 Chronic obstructive pulmonary disease, unspecified: Secondary | ICD-10-CM

## 2020-12-27 DIAGNOSIS — R739 Hyperglycemia, unspecified: Secondary | ICD-10-CM | POA: Diagnosis not present

## 2020-12-27 LAB — COMPREHENSIVE METABOLIC PANEL
ALT: 20 U/L (ref 0–35)
AST: 24 U/L (ref 0–37)
Albumin: 4.3 g/dL (ref 3.5–5.2)
Alkaline Phosphatase: 46 U/L (ref 39–117)
BUN: 22 mg/dL (ref 6–23)
CO2: 31 mEq/L (ref 19–32)
Calcium: 10.2 mg/dL (ref 8.4–10.5)
Chloride: 101 mEq/L (ref 96–112)
Creatinine, Ser: 0.76 mg/dL (ref 0.40–1.20)
GFR: 73.88 mL/min (ref >60.00)
Glucose, Bld: 89 mg/dL (ref 70–99)
Potassium: 4.2 mEq/L (ref 3.5–5.1)
Sodium: 138 mEq/L (ref 135–145)
Total Bilirubin: 0.6 mg/dL (ref 0.2–1.2)
Total Protein: 7.3 g/dL (ref 6.0–8.3)

## 2020-12-27 LAB — LIPID PANEL
Cholesterol: 149 mg/dL (ref 0–200)
HDL: 77.8 mg/dL (ref >39.00)
LDL Cholesterol: 56 mg/dL (ref 0–99)
NonHDL: 71.34
Total CHOL/HDL Ratio: 2
Triglycerides: 75 mg/dL (ref 0.0–149.0)
VLDL: 15 mg/dL (ref 0.0–40.0)

## 2020-12-27 LAB — HEMOGLOBIN A1C: Hgb A1c MFr Bld: 5.9 % (ref 4.6–6.5)

## 2020-12-27 MED ORDER — PANTOPRAZOLE SODIUM 40 MG PO TBEC: 40.0000 mg | DELAYED_RELEASE_TABLET | Freq: Every day | ORAL | 1 refills | Status: DC

## 2020-12-27 NOTE — Progress Notes (Signed)
Subjective:    Patient ID: Dominique [name], female    DOB: 01/14/1941, 80 y.o.   MRN: [id number]  DOS:  12/27/2020 Type of visit - description: Follow-up Since the last office visit saw neuro, pulmonary, cardiology, notes reviewed.  In general feeling well. DOE: Still there sometimes but improving. Edema: Better with leg elevation, not needing Lasix. History of remote GERD, symptoms has resurfaced lately.  Described as a burning sensation at the abdomen and chest.  Tums OTC not helping much. Denies weight loss, dysphagia or odynophagia.   Review of Systems Denies chest pain. No cough or wheezing.  Past Medical History:  Diagnosis Date  . Atrial fibrillation ([place])    a. dx 03/2019 while in hospital with cholecystitis  . COPD (chronic obstructive pulmonary disease) ([place])   . Critical lower limb ischemia (HCC)    a. after TAVR developed ischemic L leg likely due to flap/closure, s/p emergent repair of left femoral artery with endarterectomy and Dacron patch angioplasty.  . Emphysema lung ([place])   . Hyperlipidemia    LDL goal < 70  . Hypertension   . Hypomagnesemia   . Normal coronary arteries 10/2018  . NSVT (nonsustained ventricular tachycardia) ([place]) 03/02/2019  . Pulmonary HTN ([place]) 06/05/2016   Moderate with PASP 29mmHg by echo 04/2016 likely Group 3 from COPD and possibly Group 2 from pulmonary venous HTN associated with moderate AS - f/u echo 12/2018 post TAVR showed normal RVSP  . Pulmonary nodules    seen on pre TAVR CT, needs 12 month CT follow up  . 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 Sacral fracture ([place])   . Severe aortic stenosis    a. s/p TAVR 12/2018.  Marland Kitchen Thoracic ascending aortic aneurysm [facility])    needs yearly follow up    Past Surgical History:  Procedure Laterality Date  . APPENDECTOMY  1951  . CATARACT EXTRACTION    . CESAREAN SECTION     1971  .  CHOLECYSTECTOMY N/A 04/28/2019   Procedure: LAPAROSCOPIC CHOLECYSTECTOMY;  Surgeon: Stark Klein, MD;  Location: [facility];  Service: General;  Laterality: N/A;  . COLONOSCOPY  2018  . FEMORAL-POPLITEAL BYPASS GRAFT Left 12/31/2018   Procedure: PATCH ANGIOPLASTY REPAIR LEFT FEMORAL ARTERY USING HEMASHIELD PLATINUM FINESSE PATCH;  Surgeon: Rosetta Posner, MD;  Location: [facility];  Service: Vascular;  Laterality: Left;  . IR RADIOLOGY PERIPHERAL GUIDED IV START  05/30/2017  . IR RADIOLOGY PERIPHERAL GUIDED IV START  09/02/2018  . IR RADIOLOGY PERIPHERAL GUIDED IV START  09/02/2018  . IR US GUIDE VASC ACCESS LEFT  09/02/2018  . IR US GUIDE VASC ACCESS RIGHT  05/30/2017  . IR US GUIDE VASC ACCESS RIGHT  09/02/2018  . JOINT REPLACEMENT  2016   sacroplasty  . LAMINECTOMY    . RIGHT/LEFT HEART CATH AND CORONARY ANGIOGRAPHY N/A 11/27/2018   Procedure: RIGHT/LEFT HEART CATH AND CORONARY ANGIOGRAPHY;  Surgeon: Sherren Mocha, MD;  Location: [facility];  Service: Cardiovascular;  Laterality: N/A;  . TEE WITHOUT CARDIOVERSION N/A 12/30/2018   Procedure: TRANSESOPHAGEAL ECHOCARDIOGRAM (TEE);  Surgeon: Sherren Mocha, MD;  Location: [place] [facility];  Service: Open Heart Surgery;  Laterality: N/A;  . TOTAL HIP ARTHROPLASTY Bilateral   . TRANSCATHETER AORTIC VALVE REPLACEMENT, TRANSFEMORAL N/A 12/30/2018   Procedure: TRANSCATHETER AORTIC VALVE REPLACEMENT, TRANSFEMORAL;  Surgeon: Sherren Mocha, MD;  Location: [place] [facility];  Service: Open Heart Surgery;  Laterality: N/A;  . TUBAL LIGATION  1975    Allergies as of 12/27/2020   No Known Allergies     Medication List       Accurate as of December 27, 2020 11:59 PM. If you have any questions, ask your nurse or doctor.        acetaminophen 325 MG tablet Commonly known as: TYLENOL Take 2 tablets (650 mg total) by mouth every 6 (six) hours as needed for mild pain (or Fever >/= 101).   amLODipine 10 MG tablet Commonly known as: NORVASC Take 1 tablet (10 mg  total) by mouth daily.   amoxicillin 500 MG tablet Commonly known as: AMOXIL Take 4 tablets (2,000mg ) one hour prior to all dental visits.   aspirin EC 81 MG tablet Take 81 mg by mouth daily.   CALCIUM 600 + D PO Take 600 mg by mouth 3 (three) times a week.   denosumab 60 MG/ML Soln injection Commonly known as: PROLIA Inject 60 mg into the skin every 6 (six) months. Administer in upper arm, thigh, or abdomen   EPINEPHrine 0.3 mg/0.3 mL Soaj injection Commonly known as: EPI-PEN Inject 0.3 mg into the muscle as needed for anaphylaxis.   furosemide 20 MG tablet Commonly known as: LASIX DAILY AS NEEDED FOR SWELLING   Incruse Ellipta 62.5 MCG/INH Aepb Generic drug: umeclidinium bromide Inhale 1 puff into the lungs daily.   loratadine 10 MG tablet Commonly known as: CLARITIN Take 10 mg by mouth daily as needed for allergies.   magnesium oxide 400 MG tablet Commonly known as: MAG-OX TAKE 1 TABLET (400 MG TOTAL) BY MOUTH DAILY. PT OVERDUE FOR [place] OV PLEASE CALL FOR APPT   metoprolol succinate 25 MG 24 hr tablet Commonly known as: TOPROL-XL TAKE 1 TABLET BY MOUTH EVERY DAY   multivitamin with minerals Tabs tablet Take 1 tablet by mouth daily.   pantoprazole 40 MG tablet Commonly known as: PROTONIX Take 1 tablet (40 mg total) by mouth daily before breakfast. Started by: Kathlene November, MD   rosuvastatin 40 MG tablet Commonly known as: CRESTOR TAKE 1 TABLET BY MOUTH DAILY   Ventolin HFA 108 (90 Base) MCG/ACT inhaler Generic drug: albuterol TAKE 2 PUFFS BY MOUTH EVERY 6 HOURS AS NEEDED FOR WHEEZE OR SHORTNESS OF BREATH   Vitamin D3 125 MCG (5000 UT) Caps Take 5,000 Units by mouth daily.          Objective:   Physical Exam BP 128/72 (BP Location: Left Arm, Patient Position: Sitting, Cuff Size: Large)   Pulse 71   Temp 97.9 F (36.6 C) (Oral)   Ht 5\' 6"  (1.676 m)   Wt 167 lb (75.8 kg)   SpO2 95%   BMI 26.95 kg/m  General:   Well developed, NAD, BMI noted.   HEENT:  Normocephalic . Face symmetric, atraumatic Lungs:  CTA B Normal respiratory effort, no intercostal retractions, no accessory muscle use. Heart: RRR,  no murmur.  Abdomen:  Not distended, soft, non-tender. No rebound or rigidity.   Skin: Not pale. Not jaundice Lower extremities: Trace pretibial edema bilaterally  Neurologic:  alert & oriented X3.  Speech normal, gait appropriate for age and unassisted Psych--  Cognition and judgment appear intact.  Cooperative with normal attention span and concentration.  Behavior appropriate. No anxious or depressed appearing.     Assessment     Assessment HTN Hyperlipidemia PULM: -Emphysema, quit tobacco 2006 -CT chest angio  05-2016 ----pulm nodule x2,reticular infiltrate ;  f/u CT 11/2016 stable nodule, no f/u ----enlarged R P.A. and Ao -Abnormal PET scan 12-2019 CV: -DOE: (-) myoview  8-017, CT angio chest 05-2016:  no PE  -S/p TVAR 12/2018 -Paroxysmal A. fib DX 6/28/ 2020 in the context of acute cholecystitis, converted to NSR, not anticoagulated as of 05/25/2019 --Nonobstructive CAD (per coronary CTA) -Aspirin indefinitely, SBE prophylaxis with amoxicillin -MSK --DJD --Sacral Fx, sacral insufficiency, s/p sacroplasty (radiology) 07-2015 --H/o osteoporosis :  took fosamax while in [place], Fosamax rx again by ortho after sacral Fx 07-2015, never took it, first Prolia 11-14-2015 --T score 08-2015 >> normal (likely normal d/t DJD?) H/o vit D def: Normal level 09-2015   PLAN  HTN: Seems well controlled on amlodipine, metoprolol.  Edema managed with leg elevation, not using Lasix. Emphysema: Saw pulmonary, 10-2020. I rec tudorza, eventually switched to  [employer]. Pulm rec to continue it. Lung nodules recommended CT scan around 04-2021.   DOE: Somewhat improved. Migraines: Saw neurology 09/19/2020 for recurrent headaches and transient blurred vision, possibly migraines.  No further eval suggested. Cardiovascular: Saw  cardiology 09/02/2020, plan to get right carotid CTA 02/2021.  Shortness of breath suspected to be more from COPD. High cholesterol: Check FLP, continue Crestor. Hyperglycemia: Check A1c. GERD: Used to have reflux, symptoms resurfaced a couple of months ago, Tums not helping enough, prescribed PPIs for 2 months.  If symptoms persist, further eval?. Preventive care: PNM 23 today RTC 4 months  This visit occurred during the SARS-CoV-2 public health emergency.  Safety protocols were in place, including screening questions prior to the visit, additional usage of staff PPE, and extensive cleaning of exam room while observing appropriate contact time as indicated for disinfecting solutions.

## 2020-12-27 NOTE — Patient Instructions (Signed)
Check the  blood pressure regularly BP GOAL is between 110/65 and  135/85. If it is consistently higher or lower, let me know   For acid reflux: Take pantoprazole 1 tablet before breakfast for 6 weeks, then stop.   GO TO THE LAB : Get the blood work     GO TO THE FRONT DESK, [place] back for a checkup in 4 to 5 months

## 2020-12-28 NOTE — Assessment & Plan Note (Signed)
HTN: Seems well controlled on amlodipine, metoprolol.  Edema managed with leg elevation, not using Lasix. Emphysema: Saw pulmonary, 10-2020. I rec tudorza, eventually switched to  [employer]. Pulm rec to continue it. Lung nodules recommended CT scan around 04-2021.   DOE: Somewhat improved. Migraines: Saw neurology 09/19/2020 for recurrent headaches and transient blurred vision, possibly migraines.  No further eval suggested. Cardiovascular: Saw cardiology 09/02/2020, plan to get right carotid CTA 02/2021.  Shortness of breath suspected to be more from COPD. High cholesterol: Check FLP, continue Crestor. Hyperglycemia: Check A1c. GERD: Used to have reflux, symptoms resurfaced a couple of months ago, Tums not helping enough, prescribed PPIs for 2 months.  If symptoms persist, further eval?. Preventive care: PNM 23 today RTC 4 months

## 2021-01-19 ENCOUNTER — Other Ambulatory Visit: Payer: Self-pay | Admitting: Internal Medicine

## 2021-01-23 ENCOUNTER — Other Ambulatory Visit: Payer: Self-pay | Admitting: Internal Medicine

## 2021-01-24 ENCOUNTER — Ambulatory Visit (INDEPENDENT_AMBULATORY_CARE_PROVIDER_SITE_OTHER): Payer: Medicare Other

## 2021-01-24 ENCOUNTER — Other Ambulatory Visit: Payer: Self-pay

## 2021-01-24 DIAGNOSIS — M810 Age-related osteoporosis without current pathological fracture: Secondary | ICD-10-CM

## 2021-01-24 MED ORDER — DENOSUMAB 60 MG/ML ~~LOC~~ SOSY
60.0000 mg | PREFILLED_SYRINGE | Freq: Once | SUBCUTANEOUS | Status: AC
  Administered 2021-01-24: 60 mg via SUBCUTANEOUS

## 2021-01-24 NOTE — Progress Notes (Signed)
Dominique [name] is a 80 y.o. female presents to the office today for Prolia injection, per physician's orders.  denosuman (med), 60mg /72mL (dose),  SQ (route) was administered Left arm  (location) today. Patient tolerated injection. Patient due for follow up labs/provider appt:  Patient next injection due: 07/28/2021 or later no earlier.  appt made No  Loleta Chance

## 2021-02-02 ENCOUNTER — Telehealth: Payer: Self-pay | Admitting: Cardiovascular Disease

## 2021-02-02 NOTE — Telephone Encounter (Signed)
Attempt to call patient, left message to call back

## 2021-02-02 NOTE — Telephone Encounter (Signed)
Pt c/o swelling: STAT is pt has developed SOB within 24 hours  1) How much weight have you gained and in what time span? Pt is unaware but states she has gained weight and she watches what she eats  2) If swelling, where is the swelling located?  Both legs and Feet  3) Are you currently taking a fluid pill? NO  4) Are you currently SOB? Yes pt has a artificial heart valve and COPD  5) Do you have a log of your daily weights (if so, list)? No   6) Have you gained 3 pounds in a day or 5 pounds in a week? No  7) Have you traveled recently? No  Pt elevates her legs at night but it's not helping the swelling per pt Pt does have COPD and that could be contributing to the SOB  Pt c/o Shortness Of Breath: STAT if SOB developed within the last 24 hours or pt is noticeably SOB on the phone  1. Are you currently SOB (can you hear that pt is SOB on the phone)?  Yes per pt she does have copd   2. How long have you been experiencing SOB? A while  3. Are you SOB when sitting or when up moving around? both  4. Are you currently experiencing any other symptoms? No

## 2021-02-08 DIAGNOSIS — H04123 Dry eye syndrome of bilateral lacrimal glands: Secondary | ICD-10-CM | POA: Diagnosis not present

## 2021-02-09 DIAGNOSIS — H43393 Other vitreous opacities, bilateral: Secondary | ICD-10-CM | POA: Diagnosis not present

## 2021-02-09 DIAGNOSIS — H11112 Conjunctival deposits, left eye: Secondary | ICD-10-CM | POA: Diagnosis not present

## 2021-02-09 DIAGNOSIS — H10413 Chronic giant papillary conjunctivitis, bilateral: Secondary | ICD-10-CM | POA: Diagnosis not present

## 2021-02-09 DIAGNOSIS — H11222 Conjunctival granuloma, left eye: Secondary | ICD-10-CM | POA: Diagnosis not present

## 2021-02-13 DIAGNOSIS — H11222 Conjunctival granuloma, left eye: Secondary | ICD-10-CM | POA: Diagnosis not present

## 2021-02-13 DIAGNOSIS — S0502XD Injury of conjunctiva and corneal abrasion without foreign body, left eye, subsequent encounter: Secondary | ICD-10-CM | POA: Diagnosis not present

## 2021-02-13 DIAGNOSIS — H11112 Conjunctival deposits, left eye: Secondary | ICD-10-CM | POA: Diagnosis not present

## 2021-02-14 NOTE — Telephone Encounter (Signed)
Attempted to contact patient to check in after her previous message of swelling.  Left call back number and message to call back if she is continue to have issues.

## 2021-02-15 NOTE — Telephone Encounter (Signed)
PT is returning Dominique [name] phone call.Please advise

## 2021-02-15 NOTE — Telephone Encounter (Signed)
Spoke with pt, her swelling she reports comes and goes. For the last 5 days she reports the swelling has not gone down. She reports she has taken the furosemide for the last 5 days and the swelling has not changed. She feels her weight is up but does not really weigh daily. She denies any SOB and does admit to dietary indiscretion over the easter holiday. Patient instructed to increase furosemide to 40 mg once daily for the next 2-3 days. If the swelling continues after that or her symptoms change she will call back.

## 2021-02-15 NOTE — Telephone Encounter (Signed)
Left message for pt to call, last attempt before removing from triage.

## 2021-03-02 ENCOUNTER — Ambulatory Visit
Admission: RE | Admit: 2021-03-02 | Discharge: 2021-03-02 | Disposition: A | Discharge status: Home / Self Care | Payer: Medicare Other | Source: Ambulatory Visit | Attending: Cardiovascular Disease | Admitting: Cardiovascular Disease

## 2021-03-02 DIAGNOSIS — J432 Centrilobular emphysema: Secondary | ICD-10-CM | POA: Diagnosis not present

## 2021-03-02 DIAGNOSIS — I6523 Occlusion and stenosis of bilateral carotid arteries: Secondary | ICD-10-CM

## 2021-03-02 DIAGNOSIS — I672 Cerebral atherosclerosis: Secondary | ICD-10-CM | POA: Diagnosis not present

## 2021-03-02 DIAGNOSIS — E042 Nontoxic multinodular goiter: Secondary | ICD-10-CM | POA: Diagnosis not present

## 2021-03-02 MED ORDER — IOPAMIDOL (ISOVUE-370) INJECTION 76%
75.0000 mL | Freq: Once | INTRAVENOUS | Status: AC | PRN
  Administered 2021-03-02: 75 mL via INTRAVENOUS

## 2021-03-07 ENCOUNTER — Telehealth: Payer: Self-pay | Admitting: Cardiovascular Disease

## 2021-03-07 DIAGNOSIS — E041 Nontoxic single thyroid nodule: Secondary | ICD-10-CM | POA: Diagnosis not present

## 2021-03-07 DIAGNOSIS — I6523 Occlusion and stenosis of bilateral carotid arteries: Secondary | ICD-10-CM

## 2021-03-07 NOTE — Telephone Encounter (Signed)
There is moderate blockage in the right carotid. Recommend that she establish care with vascular. Study also shows nodules in the thyroid. A thyroid ultrasound is recommended. Please also come get a TSH checked.   Spoke with pt, aware of results and recommendations. Referral placed for vascular surgeons, thyroid ultasound order placed and lab orders placed as well.

## 2021-03-07 NOTE — Telephone Encounter (Signed)
Patient was returning call for her results. Please advise

## 2021-03-08 ENCOUNTER — Telehealth: Payer: Self-pay | Admitting: Cardiovascular Disease

## 2021-03-08 LAB — TSH: TSH: 1.88 u[IU]/mL (ref 0.450–4.500)

## 2021-03-08 NOTE — Telephone Encounter (Signed)
Spoke with patient regarding the Wednesday 03/15/21 1:00pm Thyroid Ultrasound appointment at [address], [address]--[facility]--arrival time is 12:45 pm for check in.  Patient voiced her understanding.

## 2021-03-14 NOTE — Progress Notes (Addendum)
Cardiology Office Note:    Date:  03/21/2021   ID:  Dominique [name], DOB 10-Feb-1941, MRN [id number]  PCP:  Colon Branch, MD  Cardiologist:  [name], MD  Electrophysiologist:  None   Referring MD: Colon Branch, MD   Chief Complaint: routine follow-up of aortic stenosis   History of Present Illness:    Dominique [name] is a 80 y.o. female with a history of normal coronaries on cardiac catheterization in 10/2018, severe aortic stenosis s/p TAVR in 0/6237 complicated by critical limb ischemia due to flap/closure s/p emergent repair of left remoral artery with endarterectomy and angioplasty, paroxysmal atrial fibrillation in the setting of acute cholecystitis, palpitations with brief runs of SVT and PACs noted on monitor in 07/2020, bilateral carotid stenosis (right > left), severe COPD with mild pulmonary hypertension, hypertension, and hyperlipidemia who is followed by Dr. [name] and presents today for routine follow-up.  Patient has a history of chest pain and aortic stenosis. She had coronary CTA in 05/2017 and again in 08/2018 both times revealing non-obstructive CAD. She had prgressive dyspnea on exertion and chest pain. Echo in 10/2018 showed progression of her aortic stenosis to moderate to severe range. Therefore, she was referred to Dr. [name] for work-up of TAVR. R/LHC in 10/2018 showed normal coronaries but hemodynamic findings were consistent with moderate AS (although Echo findings were more consistent with severe AS). She ultimately underwent TAVR with Dr. [name] in 12/2018. Procedure was complicated by an ischemic leg. She went to the OR emergently with Dr. [name] for repair of left femoral artery with endarterectomy and Dacryon patch angioplasty.   She was admitted in 03/2019 with acute cholecystitis requiring laparoscopic cholecystectomy. Hospitalization was complicated by new onset atrial fibrillation. This was felt to be due to acute illness; therefore, she was not started on  long-term anticoagulation. No documented recurrence of atrial fibrillation. She then had symptoms concerning for TIA in 05/2019. Carotid dopplers were ordered and showed 60-79% stenosis of right ICA and 1-39% stenosis of left ICA. She was referred to Neurology and had a brain MRI in 06/2019 which showed chronic small vessel changes but no acute findings. Repeat carotid dopplers in 02/2020 showed only mild stenosis bilaterally.   Last Echo was in 07/2020 and showed LVEF of 65-70% with normal wall motion and grade 1 diastolic dysfunction. TAVR valve stable with only trivial paravalvular leak and mean gradient of 8.0 mmHg. She has worn multiple outpatient monitor over the last couple which have shown SVT and premature beats but no atrial fibrillation/flutter. Last monitor in 07/2020 was ordered for further evaluation of "heart racing" after 3rd COVID vaccine and showed several runs of SVT up to 7 beats as well as occasional PVCs.   Patient was last seen by Dr. [name] in 08/2020 at which time she continued to report dyspnea with minimal exertion but no orthopnea or PND. She also reported an episode of chest pain while playing cards with her friends the day before but no exertional chest pain. BNP was normal. Dyspnea was felt to be due to COPD but she was prescribed PRN Lasix for lower extremity edema. No additional ischemic evaluation was felt to be needed. Carotid CTA was ordered to clarify patient's degree of carotid disease.   Neck CTA was performed on 03/02/2021 and showed 60-70% stenosis of right ICA as well as well as multiple thyroid nodules. Patient was referred to Vascular Surgery and thyroid ultrasound was ordered which confirmed multiple bilateral thyroid nodule which meet criteria  for surveillance. Follow-up ultrasound was recommended in 1 year.  Patient presents today for follow-up. Here alone. She has done well since last visit. She did have questions about her neck CTA as well as her thyroid  ultrasound and we discussed these. She still has rare episodes of palpitations. She estimates that she has had 3-4 episodes since last visit. No lightheadedness/dizziness or near syncope/syncope. No chest pain. She has chronic shortness of breath with walking that has improved with her inhalers. She was actually able to go dancing the other night and did fine with this. No orthopnea or PND. She has had worsening lower extremity recently (left leg chronically larger than right) and has been taking Lasix daily. Yesterday, she even took 2 doses of Lasix. She does think her swelling is better today. She really noticed swelling started to get worse around Easter. She also notes 8lb weight gain in the last couple of months but states she has now started to lose that weight. Weight is stable from 12/2020.   She also notes small erythematous rash scattered on upper extremities that are scabbing over and look slightly inflamed due to patient scratching them. Recommended over the counter hydrocortisone cream and to notify PCP if it does not improve.  Past Medical History:  Diagnosis Date  . Atrial fibrillation (HCC)    a. dx 03/2019 while in hospital with cholecystitis  . COPD (chronic obstructive pulmonary disease) (HCC)   . Critical lower limb ischemia (HCC)    a. after TAVR developed ischemic L leg likely due to flap/closure, s/p emergent repair of left femoral artery with endarterectomy and Dacron patch angioplasty.  . Emphysema lung (HCC)   . Hyperlipidemia    LDL goal < 70  . Hypertension   . Hypomagnesemia   . Normal coronary arteries 10/2018  . NSVT (nonsustained ventricular tachycardia) (HCC) 03/02/2019  . Pulmonary HTN (HCC) 06/05/2016   Moderate with PASP 45mmHg by echo 04/2016 likely Group 3 from COPD and possibly Group 2 from pulmonary venous HTN associated with moderate AS - f/u echo 12/2018 post TAVR showed normal RVSP  . Pulmonary nodules    seen on pre TAVR CT, needs 12 month CT follow up  .  S/P TAVR (transcatheter aortic valve replacement) 12/30/2018   Medtronic Evolut Pro-Plus THV (size 26 mm, serial # [id number]) via the TF approach  . Sacral fracture (HCC)   . Severe aortic stenosis    a. s/p TAVR 12/2018.  Marland Kitchen. Thoracic ascending aortic aneurysm [facility](HCC)    needs yearly follow up    Past Surgical History:  Procedure Laterality Date  . APPENDECTOMY  1951  . CATARACT EXTRACTION    . CESAREAN SECTION     1971  . CHOLECYSTECTOMY N/A 04/28/2019   Procedure: LAPAROSCOPIC CHOLECYSTECTOMY;  Surgeon: Almond LintByerly, Faera, MD;  Location: [facility];  Service: General;  Laterality: N/A;  . COLONOSCOPY  2018  . FEMORAL-POPLITEAL BYPASS GRAFT Left 12/31/2018   Procedure: PATCH ANGIOPLASTY REPAIR LEFT FEMORAL ARTERY USING HEMASHIELD PLATINUM FINESSE PATCH;  Surgeon: Larina EarthlyEarly, Todd F, MD;  Location: [facility] OR;  Service: Vascular;  Laterality: Left;  . IR RADIOLOGY PERIPHERAL GUIDED IV START  05/30/2017  . IR RADIOLOGY PERIPHERAL GUIDED IV START  09/02/2018  . IR RADIOLOGY PERIPHERAL GUIDED IV START  09/02/2018  . IR US GUIDE VASC ACCESS LEFT  09/02/2018  . IR US GUIDE VASC ACCESS RIGHT  05/30/2017  . IR US GUIDE VASC ACCESS RIGHT  09/02/2018  . JOINT REPLACEMENT  2016  sacroplasty  . LAMINECTOMY    . RIGHT/LEFT HEART CATH AND CORONARY ANGIOGRAPHY N/A 11/27/2018   Procedure: RIGHT/LEFT HEART CATH AND CORONARY ANGIOGRAPHY;  Surgeon: Sherren Mocha, MD;  Location: [place] [facility];  Service: Cardiovascular;  Laterality: N/A;  . TEE WITHOUT CARDIOVERSION N/A 12/30/2018   Procedure: TRANSESOPHAGEAL ECHOCARDIOGRAM (TEE);  Surgeon: Sherren Mocha, MD;  Location: [place] [facility];  Service: Open Heart Surgery;  Laterality: N/A;  . TOTAL HIP ARTHROPLASTY Bilateral   . TRANSCATHETER AORTIC VALVE REPLACEMENT, TRANSFEMORAL N/A 12/30/2018   Procedure: TRANSCATHETER AORTIC VALVE REPLACEMENT, TRANSFEMORAL;  Surgeon: Sherren Mocha, MD;  Location: [place] [facility];  Service: Open Heart Surgery;  Laterality: N/A;  . TUBAL  LIGATION  1975    Current Medications: Current Meds  Medication Sig  . acetaminophen (TYLENOL) 325 MG tablet Take 2 tablets (650 mg total) by mouth every 6 (six) hours as needed for mild pain (or Fever >/= 101).  Marland Kitchen amLODipine (NORVASC) 5 MG tablet Take 1 tablet (5 mg total) by mouth daily.  Marland Kitchen amoxicillin (AMOXIL) 500 MG tablet Take 4 tablets (2,000mg ) one hour prior to all dental visits.  Marland Kitchen aspirin EC 81 MG tablet Take 81 mg by mouth daily.  . Calcium Carb-Cholecalciferol (CALCIUM 600 + D PO) Take 600 mg by mouth 3 (three) times a week.   . Cholecalciferol (VITAMIN D3) 5000 UNITS CAPS Take 5,000 Units by mouth daily.   Marland Kitchen denosumab (PROLIA) 60 MG/ML SOLN injection Inject 60 mg into the skin every 6 (six) months. Administer in upper arm, thigh, or abdomen  . EPINEPHrine 0.3 mg/0.3 mL IJ SOAJ injection Inject 0.3 mg into the muscle as needed for anaphylaxis.   . furosemide (LASIX) 20 MG tablet DAILY AS NEEDED FOR SWELLING  . hydrochlorothiazide (MICROZIDE) 12.5 MG capsule Take 1 capsule (12.5 mg total) by mouth daily.  Marland Kitchen loratadine (CLARITIN) 10 MG tablet Take 10 mg by mouth daily as needed for allergies.   . magnesium oxide (MAG-OX) 400 MG tablet TAKE 1 TABLET (400 MG TOTAL) BY MOUTH DAILY. PT OVERDUE FOR [place] OV PLEASE CALL FOR APPT  . metoprolol succinate (TOPROL-XL) 25 MG 24 hr tablet TAKE 1 TABLET BY MOUTH EVERY DAY  . Multiple Vitamin (MULTIVITAMIN WITH MINERALS) TABS tablet Take 1 tablet by mouth daily.  . pantoprazole (PROTONIX) 40 MG tablet Take 1 tablet (40 mg total) by mouth daily before breakfast.  . rosuvastatin (CRESTOR) 40 MG tablet TAKE 1 TABLET BY MOUTH DAILY  . umeclidinium bromide (INCRUSE ELLIPTA) 62.5 MCG/INH AEPB Inhale 1 puff into the lungs daily.  . VENTOLIN HFA 108 (90 Base) MCG/ACT inhaler TAKE 2 PUFFS BY MOUTH EVERY 6 HOURS AS NEEDED FOR WHEEZE OR SHORTNESS OF BREATH  . [DISCONTINUED] amLODipine (NORVASC) 10 MG tablet Take 1 tablet (10 mg total) by mouth daily.      Allergies:   Patient has no known allergies.   Social History   Socioeconomic History  . Marital status: Widowed    Spouse name: Not on file  . Number of children: 2  . Years of education: Not on file  . Highest education level: Not on file  Occupational History  . Occupation: n/a  Tobacco Use  . Smoking status: Former Smoker    Packs/day: 1.50    Years: 40.00    Pack years: 60.00    Types: Cigarettes    Quit date: 03/19/2004    Years since quitting: 17.0  . Smokeless tobacco: Never Used  Vaping Use  . Vaping  Use: Never used  Substance and Sexual Activity  . Alcohol use: Yes    Alcohol/week: 3.0 - 4.0 standard drinks    Types: 3 - 4 Glasses of wine per week    Comment: socially  . Drug use: No  . Sexual activity: Not on file  Other Topics Concern  . Not on file  Social History Narrative   From [place]   1 living son   Social Determinants of Health   Financial Resource Strain: Low Risk   . Difficulty of Paying Living Expenses: Not hard at all  Food Insecurity: No Food Insecurity  . Worried About Charity fundraiser in the Last Year: Never true  . Ran Out of Food in the Last Year: Never true  Transportation Needs: No Transportation Needs  . Lack of Transportation (Medical): No  . Lack of Transportation (Non-Medical): No  Physical Activity: Not on file  Stress: Not on file  Social Connections: Not on file     Family History: The patient's family history includes Breast cancer in her mother; Cancer in her brother; Diabetes in her son. There is no history of Colon cancer, CAD, Colon polyps, Rectal cancer, or Stomach cancer.  ROS:   Please see the history of present illness.     EKGs/Labs/Other Studies Reviewed:    The following studies were reviewed today:  Right/Left Cardiac Catheterization 11/27/2018: 1. Angiographically normal coronary arteries (left dominant) 2. Calcified, restricted aortic valve by plain fluoroscopy with hemodynamic findings consistent  with moderate aortic stenosis (mean gradient 17 mmHg, AVA 1.47 square cm) 3. Normal right heart pressures  The patient has progressive dyspnea, NYHA III sx's, with echo findings consistent with severe AS. Will proceed with multidisciplinary evaluation of aortic stenosis.  Diagnostic Dominance: Left   _______________  Carotid Ultrasounds 03/07/2020: Summary:  - Right Carotid: Velocities in the right ICA are consistent with a 1-39%  stenosis. There is no evidence of a 60-79% stenosis as seen on the  prior exam.  - Left Carotid: Velocities in the left ICA are consistent with a 1-39%  stenosis.  - Vertebrals: Bilateral vertebral arteries demonstrate antegrade flow.  - Subclavians: Normal flow hemodynamics were seen in bilateral subclavian  arteries.   *See table(s) above for measurements and observations.   Suggest follow up study PRN.  _______________  Echocardiogram 08/02/2020: Impressions: 1. Left ventricular ejection fraction, by estimation, is 65 to 70%. The  left ventricle has normal function. The left ventricle has no regional  wall motion abnormalities. Left ventricular diastolic parameters are  consistent with Grade I diastolic  dysfunction (impaired relaxation). The average left ventricular global  longitudinal strain is -19.3 %. The global longitudinal strain is normal.  2. Right ventricular systolic function is normal. The right ventricular  size is normal. There is mildly elevated pulmonary artery systolic  pressure.  3. The mitral valve is normal in structure. Trivial mitral valve  regurgitation. No evidence of mitral stenosis.  4. Trivial paravalvular leak. The aortic valve has been  repaired/replaced. Aortic valve regurgitation is not visualized. No aortic  stenosis is present. There is a 26 mm CoreValve-Evolut Pro prosthetic  (TAVR) valve present in the aortic position.  Procedure Date: 12/2018. Aortic valve mean gradient measures 8.0 mmHg.  5. The  inferior vena cava is normal in size with greater than 50%  respiratory variability, suggesting right atrial pressure of 3 mmHg.   Comparison(s): 01/14/20 EF 65-70%. AV 43mmHg peak PG, 31mmHg mean PG.  _______________  Monitor 07/2020: Several  runs of SVT up to 7 beats.  Fastest rate 141 bpm Occasional PVCs   EKG:  EKG not ordered today.   Recent Labs: 07/21/2020: Hemoglobin 14.9; Magnesium 2.0; Platelets 236 09/02/2020: BNP 67.0 12/27/2020: ALT 20; BUN 22; Creatinine, Ser 0.76; Potassium 4.2; Sodium 138 03/07/2021: TSH 1.880  Recent Lipid Panel    Component Value Date/Time   CHOL 149 12/27/2020 1109   CHOL 139 05/28/2019 1740   TRIG 75.0 12/27/2020 1109   HDL 77.80 12/27/2020 1109   HDL 54 05/28/2019 1740   CHOLHDL 2 12/27/2020 1109   VLDL 15.0 12/27/2020 1109   LDLCALC 56 12/27/2020 1109   LDLCALC 59 05/28/2019 1740    Physical Exam:    Vital Signs: BP (!) 148/72   Pulse 66   Ht 5\' 6"  (1.676 m)   Wt 167 lb 9.6 oz (76 kg)   SpO2 94%   BMI 27.05 kg/m     Wt Readings from Last 3 Encounters:  03/21/21 167 lb 9.6 oz (76 kg)  12/27/20 167 lb (75.8 kg)  11/08/20 169 lb 9.6 oz (76.9 kg)     General: 80 y.o. female in no acute distress. HEENT: Normocephalic and atraumatic. Sclera clear.  Neck: Supple. No carotid bruits. No JVD. Heart: RRR. Distinct S1 and S2. II/VI systolic murmur. No gallops or rubs. Lungs: No increased work of breathing. Clear to ausculation bilaterally. No wheezes, rhonchi, or rales.  Abdomen: Soft, non-distended, and non-tender to palpation.  Extremities: Trace to 1+ pitting edema of bilateral lower extremity worse around ankles (left chronically larger than right).   Skin: Warm and dry. Neuro: Alert and oriented x3. No focal deficits. Psych: Normal affect. Responds appropriately.  Assessment:    1. Bilateral lower extremity edema   2. Aortic valve stenosis, etiology of cardiac valve disease unspecified   3. S/P TAVR (transcatheter aortic valve  replacement)   4. Coronary artery calcification   5. Stenosis of carotid artery, unspecified laterality   6. Chronic dyspnea   7. Chronic obstructive pulmonary disease, unspecified COPD type ([place])   8. Paroxysmal SVT (supraventricular tachycardia) (HCC)   9. Paroxysmal atrial fibrillation ([place])   10. Primary hypertension   11. Hyperlipidemia, unspecified hyperlipidemia type   12. Multiple thyroid nodules   13. Medication management     Plan:    Lower Extremity Edema - Patient's main complaint today is worsening lower extremity edema. No other symptoms of CHF. Appears euvolemic one exam.  - Will decrease Amlodipine to 5mg  daily.  - Will start HCTZ 12.5mg  daily. - OK to continue Lasix 20mg  as needed.  - Also recommended elevating legs as much as possible, compression stocking, and sodium/fluid restrictions.   Aortic Stenosis s/p TAVR - s/p TAVR in 12/2018. - Last Echo in 07/2020 showed stable TAVR valve with only trivial paravalvular leak and mean gradient of 8 mmHg. - Continue SBE prophylaxis prior to dental procedures..   Non-Obstructive Coronary Calcification - Non-obstructive CAD noted on coronary CTA in 2018 and 2019 but cardiac catheterization in 10/2018 showed normal coronaries. - No angina.  - Continue aspirin and statin.  Carotid Stenosis - CTA in 02/2021 showed 60-70% stenosis of right ICA.  - Continue aspirin and statin.  - She has already been referred to Vascular Surgery.  Chronic Dyspnea COPD with Mild Pulmonary Hypertension - Improved with inhalers. Felt to mostly be due to COPD.   Paroxysmal SVT Isolated Episode of Atrial Fibrillation - Occurred in setting of acute cholecystitis in 03/2019. Felt to be secondary  to acute illness so was not started on anticoagulation. No documented recurrence. Has worn multiple monitors over the last couple of years which showed short runs of SVT and premature beats but no atrial fibrillation/flutter. - She notes rare palpitations.  Overall stable. - Continue beta-blocker as above.  Hypertension - BP mildly elevated.  - Will decrease Amlodipine to 5mg  daily due to lower extremity edema.  - Will start HCTZ 12.5mg  daily. - Continue Toprol-XL 25mg  daily.  - Will check BMET today and again in 1-2 weeks after starting HCTZ.  Hyperlipidemia - Recent lipid panel in 12/2020: Total Cholesterol 149, Triglycerides 75, HDL 77.8, LDL 56.  - Continue Crestor 40mg  daily.   Thyroid Nodules - Thyroid nodules noted on neck CTA. Therefore, thyroid ultrasound was ordered and confirmed multiple bilateral thyroid nodules. Repeat ultrasound recommended in 1 year.  - Advised patient to follow-up with PCP.    Disposition: Follow up in 6 months with Dr. [name]   Medication Adjustments/Labs and Tests Ordered: Current medicines are reviewed at length with the patient today.  Concerns regarding medicines are outlined above.  Orders Placed This Encounter  Procedures  . Basic metabolic panel  . Basic metabolic panel   Meds ordered this encounter  Medications  . hydrochlorothiazide (MICROZIDE) 12.5 MG capsule    Sig: Take 1 capsule (12.5 mg total) by mouth daily.    Dispense:  90 capsule    Refill:  3  . amLODipine (NORVASC) 5 MG tablet    Sig: Take 1 tablet (5 mg total) by mouth daily.    Dispense:  180 tablet    Refill:  3    Patient Instructions  Medication Instructions:   START Hydrochlorothiazide 12.5 mg daily   DECREASE Amlodipine to 5 mg daily  *If you need a refill on your cardiac medications before your next appointment, please call your pharmacy*   Lab Work: Your physician recommends that you return for lab work [place] 03/22/21:   BMET  Your physician recommends that you return for lab work in 1 week:   BMET   If you have labs (blood work) drawn today and your tests are completely normal, you will receive your results only by: Marland Kitchen MyChart Message (if you have MyChart) OR . A paper copy in the mail If you  have any lab test that is abnormal or we need to change your treatment, we will call you to review the results.  Testing/Procedures: NONE ordered at this time of appointment   Follow-Up: At [facility], you and your health needs are our priority.  As part of our continuing mission to provide you with exceptional heart care, we have created designated Provider Care Teams.  These Care Teams include your primary Cardiologist (physician) and Advanced Practice Providers (APPs -  Physician Assistants and Nurse Practitioners) who all work together to provide you with the care you need, when you need it.  Your next appointment:   6 month(s)  The format for your next appointment:   In Person  Provider:   [name], MD  Other Instructions      Signed, Darreld Mclean, PA-C  03/21/2021 1:30 PM    [place]

## 2021-03-15 ENCOUNTER — Ambulatory Visit
Admission: RE | Admit: 2021-03-15 | Discharge: 2021-03-15 | Disposition: A | Discharge status: Home / Self Care | Payer: Medicare Other | Source: Ambulatory Visit | Attending: Cardiovascular Disease | Admitting: Cardiovascular Disease

## 2021-03-15 DIAGNOSIS — E041 Nontoxic single thyroid nodule: Secondary | ICD-10-CM

## 2021-03-21 ENCOUNTER — Encounter: Payer: Self-pay | Admitting: Student

## 2021-03-21 ENCOUNTER — Ambulatory Visit (INDEPENDENT_AMBULATORY_CARE_PROVIDER_SITE_OTHER): Payer: Medicare Other | Admitting: Student

## 2021-03-21 ENCOUNTER — Other Ambulatory Visit: Payer: Self-pay

## 2021-03-21 VITALS — BP 148/72 | HR 66 | Ht 66.0 in | Wt 167.6 lb

## 2021-03-21 DIAGNOSIS — E785 Hyperlipidemia, unspecified: Secondary | ICD-10-CM | POA: Diagnosis not present

## 2021-03-21 DIAGNOSIS — Z79899 Other long term (current) drug therapy: Secondary | ICD-10-CM

## 2021-03-21 DIAGNOSIS — I350 Nonrheumatic aortic (valve) stenosis: Secondary | ICD-10-CM | POA: Diagnosis not present

## 2021-03-21 DIAGNOSIS — I471 Supraventricular tachycardia, unspecified: Secondary | ICD-10-CM

## 2021-03-21 DIAGNOSIS — R600 Localized edema: Secondary | ICD-10-CM

## 2021-03-21 DIAGNOSIS — J449 Chronic obstructive pulmonary disease, unspecified: Secondary | ICD-10-CM | POA: Diagnosis not present

## 2021-03-21 DIAGNOSIS — R0609 Other forms of dyspnea: Secondary | ICD-10-CM

## 2021-03-21 DIAGNOSIS — I10 Essential (primary) hypertension: Secondary | ICD-10-CM

## 2021-03-21 DIAGNOSIS — I480 Paroxysmal atrial fibrillation: Secondary | ICD-10-CM

## 2021-03-21 DIAGNOSIS — Z952 Presence of prosthetic heart valve: Secondary | ICD-10-CM

## 2021-03-21 DIAGNOSIS — I2584 Coronary atherosclerosis due to calcified coronary lesion: Secondary | ICD-10-CM

## 2021-03-21 DIAGNOSIS — I2510 Atherosclerotic heart disease of native coronary artery without angina pectoris: Secondary | ICD-10-CM

## 2021-03-21 DIAGNOSIS — E042 Nontoxic multinodular goiter: Secondary | ICD-10-CM

## 2021-03-21 DIAGNOSIS — I6529 Occlusion and stenosis of unspecified carotid artery: Secondary | ICD-10-CM | POA: Diagnosis not present

## 2021-03-21 MED ORDER — AMLODIPINE BESYLATE 5 MG PO TABS: 5.0000 mg | ORAL_TABLET | Freq: Every day | ORAL | 3 refills | Status: DC

## 2021-03-21 MED ORDER — HYDROCHLOROTHIAZIDE 12.5 MG PO CAPS: 12.5000 mg | ORAL_CAPSULE | Freq: Every day | ORAL | 3 refills | Status: DC

## 2021-03-21 NOTE — Progress Notes (Signed)
Chronic Care Management Pharmacy Note  03/21/2021 Name:  Dominique [name] MRN:  [id number] DOB:  Mar 20, 1941  Subjective: Dominique [name] is an 80 y.o. year old female who is a primary patient of Paz, Alda Berthold, MD.  The CCM team was consulted for assistance with disease management and care coordination needs.    Engaged with patient by telephone for follow up visit in response to provider referral for pharmacy case management and/or care coordination services.   Consent to Services:  The patient was given information about Chronic Care Management services, agreed to services, and gave verbal consent prior to initiation of services.  Please see initial visit note for detailed documentation.   Patient Care Team: Colon Branch, MD as PCP - General (Internal Medicine) [name], MD as PCP - Cardiology (Cardiology) Dr Lindley [facility] as Consulting Physician [facility]) [facility] (Orthopedic Surgery) Linda Hedges, DO as Consulting Physician (Obstetrics and Gynecology) Day, Melvenia Beam, [facility] (Inactive) as Pharmacist (Pharmacist)  Recent office visits: 12/27/2020 - PCP (Dr Larose Kells) - F/U chronic conditions. Prescribed pantoprazole 51m daily for 6 weeks due to recurrance of GERD sx. If after 6 weeks of therapy ssx return, recommended futher w/u.   Recent consult visits: 02/2021 - Cardio / Phone Call - CT results; Showed carotid blockage - referred to vascular for evaluation; thyroid nodules - recommended thyroid U/S and TSH (was WNL)  02/02/2021  -Cardio / Phone Call - increased edema without improvement in lat 5 days of taking furosemide 228mdaily. Thought to be related to dietary indescrtion over Easter holiday. Recommended increase furosemide to 403maily for 2 to 3 days. If not improved or symptoms change - call back  11/08/2020 - Pulm (Mousa Prout Parrett, NP) F/U COPD and lung nodules. No medication changes - continue Incruse; F/U chest CT planned in 6 months.    Hospital visits: None in previous 6 months  Objective:  Lab Results  Component Value Date   CREATININE 0.76 12/27/2020   CREATININE 0.69 07/21/2020   CREATININE 0.70 01/14/2020    Lab Results  Component Value Date   HGBA1C 5.9 12/27/2020   Last diabetic Eye exam: No results found for: HMDIABEYEEXA  Last diabetic Foot exam: No results found for: HMDIABFOOTEX      Component Value Date/Time   CHOL 149 12/27/2020 1109   CHOL 139 05/28/2019 1740   TRIG 75.0 12/27/2020 1109   HDL 77.80 12/27/2020 1109   HDL 54 05/28/2019 1740   CHOLHDL 2 12/27/2020 1109   VLDL 15.0 12/27/2020 1109   LDLCALC 56 12/27/2020 1109   LDLCALC 59 05/28/2019 1740    Hepatic Function Latest Ref Rng & Units 12/27/2020 02/18/2020 06/08/2019  Total Protein 6.0 - 8.3 g/dL 7.3 - 6.8  Albumin 3.5 - 5.2 g/dL 4.3 - 3.3(L)  AST 0 - 37 U/L 24 26 46(H)  ALT 0 - 35 U/L 20 20 35  Alk Phosphatase 39 - 117 U/L 46 - 71  Total Bilirubin 0.2 - 1.2 mg/dL 0.6 - 0.5  Bilirubin, Direct 0.00 - 0.40 mg/dL - - -    Lab Results  Component Value Date/Time   TSH 1.880 03/07/2021 02:42 PM   TSH 1.070 07/21/2020 11:07 AM    CBC Latest Ref Rng & Units 07/21/2020 02/18/2020 06/08/2019  WBC 3.4 - 10.8 x10E3/uL 7.7 7.8 10.1  Hemoglobin 11.1 - 15.9 g/dL 14.9 14.6 13.0  Hematocrit 34.0 - 46.6 % 45.9 44.3 40.9  Platelets 150 - 450 x10E3/uL 236 233.0  418(H)    Lab Results  Component Value Date/Time   VD25OH 64 10/14/2015 04:11 PM   VD25OH 30.18 12/27/2014 02:08 PM    Clinical ASCVD: Yes  The ASCVD Risk score Mikey Bussing DC Jr., et al., 2013) failed to calculate for the following reasons:   The 2013 ASCVD risk score is only valid for ages 50 to 41    Other: (CHADS2VASc if Afib, PHQ9 if depression, MMRC or CAT for COPD, ACT, DEXA)  Social History   Tobacco Use  Smoking Status Former Smoker  . Packs/day: 1.50  . Years: 40.00  . Pack years: 60.00  . Types: Cigarettes  . Quit date: 03/19/2004  . Years since quitting: 17.0   Smokeless Tobacco Never Used   BP Readings from Last 3 Encounters:  12/27/20 128/72  11/08/20 (!) 116/50  09/19/20 125/66   Pulse Readings from Last 3 Encounters:  12/27/20 71  11/08/20 83  09/19/20 74   Wt Readings from Last 3 Encounters:  12/27/20 167 lb (75.8 kg)  11/08/20 169 lb 9.6 oz (76.9 kg)  09/19/20 165 lb (74.8 kg)    Assessment: Review of patient past medical history, allergies, medications, health status, including review of consultants reports, laboratory and other test data, was performed as part of comprehensive evaluation and provision of chronic care management services.   SDOH:  (Social Determinants of Health) assessments and interventions performed:    CCM Care Plan  No Known Allergies  Medications Reviewed Today    Reviewed by Colon Branch, MD (Physician) on 12/27/20 at 1037  Med List Status: <None>  Medication Order Taking? Sig Documenting Provider Last Dose Status Informant  acetaminophen (TYLENOL) 325 MG tablet [id number] Yes Take 2 tablets (650 mg total) by mouth every 6 (six) hours as needed for mild pain (or Fever >/= 101). Norm Parcel, PA-C Taking Active            Med Note (CANTER, Lindajo Royal Feb 18, 2020 10:18 AM) PRN        Discontinued 08/23/20 1025   amLODipine (NORVASC) 10 MG tablet [id number] Yes Take 1 tablet (10 mg total) by mouth daily. Colon Branch, MD Taking Active   amoxicillin (AMOXIL) 500 MG tablet [id number] Yes Take 4 tablets (2,081m) one hour prior to all dental visits. PColon Branch MD Taking Active            Med Note ([facility]TER, [facility]   Thu Feb 18, 2020 10:14 AM) PRN dental  aspirin EC 81 MG tablet [id number] Take 81 mg by mouth daily. [provider] Taking Active Self  Calcium Carb-Cholecalciferol (CALCIUM 600 + D PO) [id number] Take 600 mg by mouth 3 (three) times a week.  [provider] Taking Active Self           Med Note (Nat Christen  Fri Dec 19, 2018 11:03 AM)    Cholecalciferol  (VITAMIN D3) 5000 UNITS CAPS [id number] Take 5,000 Units by mouth daily.  [provider] Taking Active Self  denosumab (PROLIA) 60 MG/ML SOLN injection [id number] Inject 60 mg into the skin every 6 (six) months. Administer in upper arm, thigh, or abdomen [provider] Taking Active Self           Med Note (CANTER, KAYLYN D   Wed Apr 06, 2020  2:50 PM)    EPINEPHrine 0.3 mg/0.3 mL IJ SOAJ injection [id number] Inject 0.3 mg into the muscle as needed  for anaphylaxis.  [provider] Taking Active Self           Med Note (CANTER, KAYLYN D   Thu Feb 18, 2020 10:14 AM) PRN  furosemide (LASIX) 20 MG tablet [id number] No DAILY AS NEEDED FOR SWELLING  Patient not taking: Reported on 12/27/2020   [name], MD Not Taking Active   loratadine (CLARITIN) 10 MG tablet [id number] Yes Take 10 mg by mouth daily as needed for allergies.  [provider] Taking Active Self  magnesium oxide (MAG-OX) 400 MG tablet [id number] Yes TAKE 1 TABLET (400 MG TOTAL) BY MOUTH DAILY. PT OVERDUE FOR [place] [facility] PLEASE CALL FOR APPT [name], MD Taking Active   metoprolol succinate (TOPROL-XL) 25 MG 24 hr tablet [id number] Yes TAKE 1 TABLET BY MOUTH EVERY DAY [name], MD Taking Active   Multiple Vitamin (MULTIVITAMIN WITH MINERALS) TABS tablet [id number] Yes Take 1 tablet by mouth daily. [provider] Taking Active Self  rosuvastatin (CRESTOR) 40 MG tablet [id number] Yes TAKE 1 TABLET BY MOUTH DAILY [name], MD Taking Active   umeclidinium bromide (INCRUSE ELLIPTA) 62.5 MCG/INH AEPB [id number] Yes Inhale 1 puff into the lungs daily. Colon Branch, MD Taking Active            Med Note [place]   Tue Sep 20, 2020  3:50 PM) Has not started yet. Had questions  VENTOLIN HFA 108 (90 Base) MCG/ACT inhaler [id number] Yes TAKE 2 PUFFS BY MOUTH EVERY 6 HOURS AS NEEDED FOR WHEEZE OR SHORTNESS OF BREATH Colon Branch, MD Taking Active           Patient  Active Problem List   Diagnosis Date Noted  . Carotid stenosis, bilateral 03/23/2020  . Osteoporosis 02/20/2020  . Atypical migraine 10/20/2019  . NSVT (nonsustained ventricular tachycardia) ([place]) 03/02/2019  . Ischemic leg 01/01/2019  . Thoracic ascending aortic aneurysm ([place]) 12/30/2018  . S/P TAVR (transcatheter aortic valve replacement) 12/30/2018  . CAD (coronary artery disease), native coronary artery   . Multiple lung nodules 06/15/2016  . Severe aortic stenosis   . PCP NOTES >>>>>>> 10/15/2015  . Vitamin D deficiency 10/14/2015  . Sacral fracture ([place])   . Dyspnea 04/12/2015  . History of colonic polyps 03/30/2015  . Hypocalcemia 12/27/2014  . Annual physical exam 09/27/2014  . Essential hypertension 03/19/2014  . Hyperlipidemia 03/19/2014  . COPD GOLD II 03/19/2014    Immunization History  Administered Date(s) Administered  . Fluad Quad(high Dose 65+) 07/14/2019, 08/23/2020  . Influenza, High Dose Seasonal PF 10/03/2016, 08/07/2018  . Influenza-Unspecified 09/06/2015, 07/18/2017  . Moderna Sars-Covid-2 Vaccination 12/01/2019, 12/31/2019, 07/05/2020  . Pneumococcal Conjugate-13 09/27/2014  . Pneumococcal Polysaccharide-23 10/29/2010, 12/27/2020  . Td 04/24/2016  . Zoster Recombinat (Shingrix) 10/18/2017, 03/04/2018    Conditions to be addressed/monitored: CAD, HTN, HLD, COPD and GERD; scaral fracture; vit D def; carotid blockage; thyroid nodule; lung nodulel osteoporosis  There are no care plans that you recently modified to display for this patient.   Medication Assistance: None required.  Patient affirms current coverage meets needs.  Patient's preferred pharmacy is:  [id number] [place], [id number]: [phone]: [phone]  Follow Up:  Patient agrees to Care Plan and Follow-up.  Plan: Telephone follow up appointment with care management team member scheduled for:  6  months  TCherre Robins PharmD Clinical Pharmacist [facility]

## 2021-03-21 NOTE — Patient Instructions (Signed)
Medication Instructions:   START Hydrochlorothiazide 12.5 mg daily   DECREASE Amlodipine to 5 mg daily  *If you need a refill on your cardiac medications before your next appointment, please call your pharmacy*   Lab Work: Your physician recommends that you return for lab work [place] 03/22/21:   BMET  Your physician recommends that you return for lab work in 1 week:   BMET   If you have labs (blood work) drawn today and your tests are completely normal, you will receive your results only by: Marland Kitchen MyChart Message (if you have MyChart) OR . A paper copy in the mail If you have any lab test that is abnormal or we need to change your treatment, we will call you to review the results.  Testing/Procedures: NONE ordered at this time of appointment   Follow-Up: At [facility], you and your health needs are our priority.  As part of our continuing mission to provide you with exceptional heart care, we have created designated Provider Care Teams.  These Care Teams include your primary Cardiologist (physician) and Advanced Practice Providers (APPs -  Physician Assistants and Nurse Practitioners) who all work together to provide you with the care you need, when you need it.  Your next appointment:   6 month(s)  The format for your next appointment:   In Person  Provider:   [name], MD  Other Instructions

## 2021-03-22 ENCOUNTER — Ambulatory Visit (INDEPENDENT_AMBULATORY_CARE_PROVIDER_SITE_OTHER): Payer: Medicare Other | Admitting: Pharmacist

## 2021-03-22 DIAGNOSIS — J449 Chronic obstructive pulmonary disease, unspecified: Secondary | ICD-10-CM

## 2021-03-22 DIAGNOSIS — I10 Essential (primary) hypertension: Secondary | ICD-10-CM | POA: Diagnosis not present

## 2021-03-22 DIAGNOSIS — M810 Age-related osteoporosis without current pathological fracture: Secondary | ICD-10-CM | POA: Diagnosis not present

## 2021-03-22 DIAGNOSIS — G459 Transient cerebral ischemic attack, unspecified: Secondary | ICD-10-CM | POA: Diagnosis not present

## 2021-03-22 DIAGNOSIS — E7800 Pure hypercholesterolemia, unspecified: Secondary | ICD-10-CM

## 2021-03-22 DIAGNOSIS — E559 Vitamin D deficiency, unspecified: Secondary | ICD-10-CM

## 2021-03-22 LAB — BASIC METABOLIC PANEL
BUN/Creatinine Ratio: 20 (ref 12–28)
BUN: 16 mg/dL (ref 8–27)
CO2: 23 mmol/L (ref 20–29)
Calcium: 9.9 mg/dL (ref 8.7–10.3)
Chloride: 102 mmol/L (ref 96–106)
Creatinine, Ser: 0.82 mg/dL (ref 0.57–1.00)
Glucose: 90 mg/dL (ref 65–99)
Potassium: 4.8 mmol/L (ref 3.5–5.2)
Sodium: 143 mmol/L (ref 134–144)
eGFR: 72 mL/min/{1.73_m2} (ref >59)

## 2021-03-22 NOTE — Patient Instructions (Signed)
Dominique [name],  It was a pleasure speaking with you today. Please feel free to contact me if you have any questions or concerns. Below is information regarding you health goals.   Keep up the good work!  Cherre Robins, PharmD Clinical Pharmacist [facility] [place] [facility] [phone]  Visit Information  PATIENT GOALS: Goals Addressed            This Visit's Progress   . Chronic Care Management Pharmacy Care Plan       CARE PLAN ENTRY (see longitudinal plan of care for additional care plan information)  Current Barriers:  . Chronic Disease Management support, education, and care coordination needs related to Hypertension, Hyperlipidemia/CAD, COPD, Osteoporosis   Hypertension / Lower extremity swelling BP Readings from Last 3 Encounters:  03/21/21 (!) 148/72  12/27/20 128/72  11/08/20 (!) 116/50   . Pharmacist Clinical Goal(s): o Over the next 90 days, patient will work with PharmD and providers to maintain BP goal 110-135 / 65-84 (per Dr Larose Kells) . Current regimen:  . Amlodipine 5mg  daily in morning (Decreased due to swelling by cardiology office on 03/21/21) . Metoprolol Succinate 25mg  daily . Hydrochlorothiazide 12.5mg  daily in morning (started 03/21/21) . Furosemide 20mg  daily if needed for swelling . Interventions: o Discussed blood pressure goal o Discussed recent medication changes.  . Patient self care activities - Over the next 180 days, patient will: o Maintain hypertension medication regimen.  o Recommend restart checking blood pressure daily for the next 2 to 3 weeks due make sure recent medication changes keeps blood pressure at goal mentioned above.   Hyperlipidemia / heart disease Lab Results  Component Value Date/Time   LDLCALC 56 12/27/2020 11:09 AM   LDLCALC 59 05/28/2019 05:40 PM   . Pharmacist Clinical Goal(s): o Over the next 90 days, patient will work with PharmD and providers to  LDL goal < 70 . Current regimen:  . Aspirin 81mg   daily . Rosuvastatin 40mg  daily . Magnesium Oxide 400mg  daily . Patient self care activities - Over the next 180 days, patient will: o Maintain cholesterol medication regimen.  o Consider rechecking magnesium level with next labs.   COPD . Pharmacist Clinical Goal(s) o Over the next 90 days, patient will work with PharmD and providers to reduce symptoms associated with COPD . Current regimen:   Incruse Ellipta 62.70mcg inhale 1 puff daily (maintenance inhaler)  Ventolin HFA as needed (rescue inhaler) . Interventions: o Discussed differences between Ventolin and Incruse Ellipta - Ventolin = Rescue Inhaler = Use as needed for shortness of breath - Incruse Ellipta = Maintenance Inhaler = Use daily to prevent lung function decline . Patient self care activities - Over the next 180 days, patient will: o Maintain COPD Medication regimen o Contact office if you reach Medicare Coverage Gap (cost of Incruse copay increases)   Osteoporosis:  . Pharmacist Clinical Goal(s) o Over the next 90 days, patient will work with PharmD and providers to prevent fractures and maintain bone density . Current regimen:   Prolia 60mg  administered in office every 6 months (last 01/24/2021)  Calcium + D daily   Vitamin D 500IU daily  . Interventions: o Ensure 1200mg  of calcium intake per day from supplementation and diet o Ensure 800 to 1000 units per day of vitamin D (more if you have had low vitamin D levels in past)  . Patient self care activities - Over the next 180 days, patient will: o Maintain current regimen for bone health  o  Continue to walk daily o Fall prevention  Medication management . Pharmacist Clinical Goal(s): o Over the next 180 days, patient will work with PharmD and providers to maintain optimal medication adherence . [facility] . Interventions o Comprehensive medication review performed. o Continue current medication management strategy . Patient self care activities  - Over the next 180 days, patient will: o Focus on medication adherence by filling and taking medications appropriately  o Take medications as prescribed o Report any questions or concerns to PharmD and/or provider(s)  Please see past updates related to this goal by clicking on the "Past Updates" button in the selected goal         Patient verbalizes understanding of instructions provided today and agrees to view in [place].   Telephone follow up appointment with care management team member scheduled for: 6 months    Fall Prevention in the Home, Adult Falls can cause injuries and can affect people from all age groups. There are many simple things that you can do to make your home safe and to help prevent falls. Ask for help when making these changes, if needed. What actions can I take to prevent falls? General instructions  Use good lighting in all rooms. Replace any light bulbs that burn out.  Turn on lights if it is dark. Use night-lights.  Place frequently used items in easy-to-reach places. Lower the shelves around your home if necessary.  Set up furniture so that there are clear paths around it. Avoid moving your furniture around.  Remove throw rugs and other tripping hazards from the floor.  Avoid walking on wet floors.  Fix any uneven floor surfaces.  Add color or contrast paint or tape to grab bars and handrails in your home. Place contrasting color strips on the first and last steps of stairways.  When you use a stepladder, make sure that it is completely opened and that the sides are firmly locked. Have someone hold the ladder while you are using it. Do not climb a closed stepladder.  Be aware of any and all pets. What can I do in the bathroom?  Keep the floor dry. Immediately clean up any water that spills onto the floor.  Remove soap buildup in the tub or shower on a regular basis.  Use non-skid mats or decals on the floor of the tub or shower.  Attach bath  mats securely with double-sided, non-slip rug tape.  If you need to sit down while you are in the shower, use a plastic, non-slip stool.  Install grab bars by the toilet and in the tub and shower. Do not use towel bars as grab bars.      What can I do in the bedroom?  Make sure that a bedside light is easy to reach.  Do not use oversized bedding that drapes onto the floor.  Have a firm chair that has side arms to use for getting dressed. What can I do in the kitchen?  Clean up any spills right away.  If you need to reach for something above you, use a sturdy step stool that has a grab bar.  Keep electrical cables out of the way.  Do not use floor polish or wax that makes floors slippery. If you must use wax, make sure that it is non-skid floor wax. What can I do in the stairways?  Do not leave any items on the stairs.  Make sure that you have a light switch at the top of  the stairs and the bottom of the stairs. Have them installed if you do not have them.  Make sure that there are handrails on both sides of the stairs. Fix handrails that are broken or loose. Make sure that handrails are as long as the stairways.  Install non-slip stair treads on all stairs in your home.  Avoid having throw rugs at the top or bottom of stairways, or secure the rugs with carpet tape to prevent them from moving.  Choose a carpet design that does not hide the edge of steps on the stairway.  Check any carpeting to make sure that it is firmly attached to the stairs. Fix any carpet that is loose or worn. What can I do on the outside of my home?  Use bright outdoor lighting.  Regularly repair the edges of walkways and driveways and fix any cracks.  Remove high doorway thresholds.  Trim any shrubbery on the main path into your home.  Regularly check that handrails are securely fastened and in good repair. Both sides of any steps should have handrails.  Install guardrails along the edges of  any raised decks or porches.  Clear walkways of debris and clutter, including tools and rocks.  Have leaves, snow, and ice cleared regularly.  Use sand or salt on walkways during winter months.  In the garage, clean up any spills right away, including grease or oil spills. What other actions can I take?  Wear closed-toe shoes that fit well and support your feet. Wear shoes that have rubber soles or low heels.  Use mobility aids as needed, such as canes, walkers, scooters, and crutches.  Review your medicines with your health care provider. Some medicines can cause dizziness or changes in blood pressure, which increase your risk of falling. Talk with your health care provider about other ways that you can decrease your risk of falls. This may include working with a physical therapist or trainer to improve your strength, balance, and endurance. Where to find more information  Centers for Disease Control and Prevention, STEADI: WebmailGuide.co.za  [employer] on Aging: BrainJudge.co.uk Contact a health care provider if:  You are afraid of falling at home.  You feel weak, drowsy, or dizzy at home.  You fall at home. Summary  There are many simple things that you can do to make your home safe and to help prevent falls.  Ways to make your home safe include removing tripping hazards and installing grab bars in the bathroom.  Ask for help when making these changes in your home. This information is not intended to replace advice given to you by your health care provider. Make sure you discuss any questions you have with your health care provider. Document Revised: 09/27/2017 Document Reviewed: 05/30/2017 Elsevier Patient Education  2021 [employer].

## 2021-03-22 NOTE — Chronic Care Management (AMB) (Signed)
Chronic Care Management Pharmacy Note  03/22/2021 Name:  Dominique [name] MRN:  [id number] DOB:  04-08-1941  Subjective: Dominique [name] is an 80 y.o. year old female who is a primary patient of Paz, Alda Berthold, MD.  The CCM team was consulted for assistance with disease management and care coordination needs.    Engaged with patient by telephone for follow up visit in response to provider referral for pharmacy case management and/or care coordination services.   Consent to Services:  The patient was given information about Chronic Care Management services, agreed to services, and gave verbal consent prior to initiation of services.  Please see initial visit note for detailed documentation.   Patient Care Team: Colon Branch, MD as PCP - General (Internal Medicine) [name], MD as PCP - Cardiology (Cardiology) Dr [facility] as Consulting Physician (Podiatry) [facility] (Orthopedic Surgery) Linda Hedges, DO as Consulting Physician (Obstetrics and Gynecology) Cherre Robins, PharmD (Pharmacist)  Recent office visits: 12/27/2020 - Dr Larose Kells - started pantoprazole 42m daily for 6 week, then she is to stop; if GERD symptoms return when will order GI workup   Recent consult visits: 03/21/2021 - Cardio (Sarajane Jews NP) - Edema / HTN - lowered dose of amlodipine form 178mto 86m43maily and added HCTZ 12.86mg186mily  11/08/2020 - Pulm (Frederick Marro Parrett, NP) f/u COPD and lung nodules - no med changes  Hospital visits: None in previous 6 months  Objective:  Lab Results  Component Value Date   CREATININE 0.82 03/21/2021   CREATININE 0.76 12/27/2020   CREATININE 0.69 07/21/2020    Lab Results  Component Value Date   HGBA1C 5.9 12/27/2020   Last diabetic Eye exam: No results found for: HMDIABEYEEXA  Last diabetic Foot exam: No results found for: HMDIABFOOTEX      Component Value Date/Time   CHOL 149 12/27/2020 1109   CHOL 139 05/28/2019 1740    TRIG 75.0 12/27/2020 1109   HDL 77.80 12/27/2020 1109   HDL 54 05/28/2019 1740   CHOLHDL 2 12/27/2020 1109   VLDL 15.0 12/27/2020 1109   LDLCALC 56 12/27/2020 1109   LDLCALC 59 05/28/2019 1740    Hepatic Function Latest Ref Rng & Units 12/27/2020 02/18/2020 06/08/2019  Total Protein 6.0 - 8.3 g/dL 7.3 - 6.8  Albumin 3.5 - 5.2 g/dL 4.3 - 3.3(L)  AST 0 - 37 U/L 24 26 46(H)  ALT 0 - 35 U/L 20 20 35  Alk Phosphatase 39 - 117 U/L 46 - 71  Total Bilirubin 0.2 - 1.2 mg/dL 0.6 - 0.5  Bilirubin, Direct 0.00 - 0.40 mg/dL - - -    Lab Results  Component Value Date/Time   TSH 1.880 03/07/2021 02:42 PM   TSH 1.070 07/21/2020 11:07 AM    CBC Latest Ref Rng & Units 07/21/2020 02/18/2020 06/08/2019  WBC 3.4 - 10.8 x10E3/uL 7.7 7.8 10.1  Hemoglobin 11.1 - 15.9 g/dL 14.9 14.6 13.0  Hematocrit 34.0 - 46.6 % 45.9 44.3 40.9  Platelets 150 - 450 x10E3/uL 236 233.0 418(H)    Lab Results  Component Value Date/Time   VD25OH 64 10/14/2015 04:11 PM   VD25OH 30.18 12/27/2014 02:08 PM    Clinical ASCVD: Yes  The ASCVD Risk score (GofMikey BussingJr., et al., 2013) failed to calculate for the following reasons:   The 2013 ASCVD risk score is only valid for ages 40 t1279  60 Social History   Tobacco Use  Smoking  Status Former Smoker  . Packs/day: 1.50  . Years: 40.00  . Pack years: 60.00  . Types: Cigarettes  . Quit date: 03/19/2004  . Years since quitting: 17.0  Smokeless Tobacco Never Used   BP Readings from Last 3 Encounters:  03/21/21 (!) 148/72  12/27/20 128/72  11/08/20 (!) 116/50   Pulse Readings from Last 3 Encounters:  03/21/21 66  12/27/20 71  11/08/20 83   Wt Readings from Last 3 Encounters:  03/21/21 167 lb 9.6 oz (76 kg)  12/27/20 167 lb (75.8 kg)  11/08/20 169 lb 9.6 oz (76.9 kg)    Assessment: Review of patient past medical history, allergies, medications, health status, including review of consultants reports, laboratory and other test data, was performed as part of  comprehensive evaluation and provision of chronic care management services.   SDOH:  (Social Determinants of Health) assessments and interventions performed:  SDOH Interventions   Flowsheet Row Most Recent Value  SDOH Interventions   Financial Strain Interventions Intervention Not Indicated  Physical Activity Interventions Intervention Not Indicated      CCM Care Plan  No Known Allergies  Medications Reviewed Today    Reviewed by Cherre Robins, PharmD (Pharmacist) on 03/22/21 at 28  Med List Status: <None>  Medication Order Taking? Sig Documenting Provider Last Dose Status Informant  acetaminophen (TYLENOL) 325 MG tablet [id number] No Take 2 tablets (650 mg total) by mouth every 6 (six) hours as needed for mild pain (or Fever >/= 101).  Patient not taking: Reported on 03/22/2021   Anne Shutter Not Taking Active            Med Note [facility], Lindajo Royal Feb 18, 2020 10:18 AM) PRN        Discontinued 08/23/20 1025   amLODipine (NORVASC) 5 MG tablet [id number] Yes Take 1 tablet (5 mg total) by mouth daily. Darreld Mclean, PA-C Taking Active   amoxicillin (AMOXIL) 500 MG tablet [id number] Yes Take 4 tablets (2,050m) one hour prior to all dental visits. PColon Branch MD Taking Active            Med Note ([facility]ida [facility]ills [facility]  Tue Mar 21, 2021 12:19 PM)    aspirin EC 81 MG tablet [id number] Take 81 mg by mouth daily. [provider] Taking Active Self  Calcium Carb-Cholecalciferol (CALCIUM 600 + D PO) [id number] Take 600 mg by mouth 3 (three) times a week.  [provider] Taking Active Self           Med Note (Nat Christen  Fri Dec 19, 2018 11:03 AM)    Cholecalciferol (VITAMIN D3) 5000 UNITS CAPS [id number] Take 5,000 Units by mouth daily.  [provider] Taking Active Self  denosumab (PROLIA) 60 MG/ML SOLN injection [id number] Inject 60 mg into the skin every 6 (six) months. Administer in upper arm, thigh, or abdomen [provider] Taking Active Self           Med Note (CANTER, KAYLYN D   Wed Apr 06, 2020  2:50 PM)    EPINEPHrine 0.3 mg/0.3 mL IJ SOAJ injection [id number] Inject 0.3 mg into the muscle as needed for anaphylaxis.  [provider] Taking Active Self           Med Note ([facility]  Tue Mar 21, 2021 12:18 PM)    furosemide (LASIX) 20 MG tablet [id number] DAILY AS NEEDED FOR SWELLING RSkeet Latch  MD Taking Active   hydrochlorothiazide (MICROZIDE) 12.5 MG capsule [id number] Yes Take 1 capsule (12.5 mg total) by mouth daily. Darreld Mclean, PA-C Taking Active   loratadine (CLARITIN) 10 MG tablet [id number] Yes Take 10 mg by mouth daily as needed for allergies.  [provider] Taking Active Self  magnesium oxide (MAG-OX) 400 MG tablet [id number] Yes TAKE 1 TABLET (400 MG TOTAL) BY MOUTH DAILY. PT OVERDUE FOR [place] OV PLEASE CALL FOR APPT [name], MD Taking Active   metoprolol succinate (TOPROL-XL) 25 MG 24 hr tablet [id number] Yes TAKE 1 TABLET BY MOUTH EVERY DAY [name], MD Taking Active   Multiple Vitamin (MULTIVITAMIN WITH MINERALS) TABS tablet [id number] Yes Take 1 tablet by mouth daily. [provider] Taking Active Self  rosuvastatin (CRESTOR) 40 MG tablet [id number] Yes TAKE 1 TABLET BY MOUTH DAILY [name], MD Taking Active   umeclidinium bromide (INCRUSE ELLIPTA) 62.5 MCG/INH AEPB [id number] Yes Inhale 1 puff into the lungs daily. Colon Branch, MD Taking Active   VENTOLIN HFA 108 [phone] Base) MCG/ACT inhaler [id number] Yes TAKE 2 PUFFS BY MOUTH EVERY 6 HOURS AS NEEDED FOR WHEEZE OR SHORTNESS OF BREATH Colon Branch, MD Taking Active           Patient Active Problem List   Diagnosis Date Noted  . Carotid stenosis, bilateral 03/23/2020  . Osteoporosis 02/20/2020  . Atypical migraine 10/20/2019  . NSVT (nonsustained ventricular tachycardia) ([place]) 03/02/2019  . Ischemic leg 01/01/2019  . Thoracic ascending aortic aneurysm  ([place]) 12/30/2018  . S/P TAVR (transcatheter aortic valve replacement) 12/30/2018  . CAD (coronary artery disease), native coronary artery   . Multiple lung nodules 06/15/2016  . Severe aortic stenosis   . PCP NOTES >>>>>>> 10/15/2015  . Vitamin D deficiency 10/14/2015  . Sacral fracture ([place])   . Dyspnea 04/12/2015  . History of colonic polyps 03/30/2015  . Hypocalcemia 12/27/2014  . Annual physical exam 09/27/2014  . Essential hypertension 03/19/2014  . Hyperlipidemia 03/19/2014  . COPD GOLD II 03/19/2014    Immunization History  Administered Date(s) Administered  . Fluad Quad(high Dose 65+) 07/14/2019, 08/23/2020  . Influenza, High Dose Seasonal PF 10/03/2016, 08/07/2018  . Influenza-Unspecified 09/06/2015, 07/18/2017  . Moderna Sars-Covid-2 Vaccination 12/01/2019, 12/31/2019, 07/05/2020  . Pneumococcal Conjugate-13 09/27/2014  . Pneumococcal Polysaccharide-23 10/29/2010, 12/27/2020  . Td 04/24/2016  . Zoster Recombinat (Shingrix) 10/18/2017, 03/04/2018    Conditions to be addressed/monitored: CAD, HTN, HLD, COPD and GERD; osteoporosis; DJD; h/o TACR; carotid blockage; thyrpod nodule; lung nodule;   Care Plan : General Pharmacy (Adult)  Updates made by Cherre Robins, PHARMD since 03/22/2021 12:00 AM    Problem: Medication Management and Chronic Care Management for HTN, HDL, CAD, COPD, osteoporosis.   Priority: High  Onset Date: 03/22/2021  Note:   Current Barriers:  . Unable to maintain control of HTN . Experiencing dose dependent side effect to amlodipine  Pharmacist Clinical Goal(s):  Marland Kitchen Over the next 90 days, patient will achieve control of HTN as evidenced by BP meeting goal noted below . maintain control of COPD and Hyperlipidemia as evidenced by low use of rescue inhaler and LDL <70  . contact provider office for questions/concerns as evidenced notation of same in electronic health record through collaboration with PharmD and provider.   Interventions: . 1:1  collaboration with Colon Branch, MD regarding development and update of comprehensive plan of care as evidenced by provider attestation and co-signature . Inter-disciplinary care team collaboration (see longitudinal plan of  care) . Comprehensive medication review performed; medication list updated in electronic medical record  Hypertension / Lower extremity swelling (BP goal 110-135 / 65-84 (per Dr Larose Kells) . Not at BP goal and LEE swelling has not resovlve yet - meds changed just yesterday;  . Checking BP only about once a month currently  . Current regimen:  . Amlodipine 79m daily in morning (Decreased due to swelling by cardiology office on 03/21/21) . Metoprolol Succinate 244mdaily . Hydrochlorothiazide 12.62m61maily in morning (started 03/21/21) . Furosemide 31m26mily if needed for swelling . Interventions: o Discussed blood pressure goal o Discussed recent medication changes.  o Maintain hypertension medication regimen.  o Recommend restart checking blood pressure daily for the next 2 to 3 weeks due make sure recent medication changes keeps blood pressure at goal mentioned above.   Hyperlipidemia / CAD (LDL goal <70) . Controlled / at goal . Current regimen:  . Aspirin 81mg74mly . Rosuvastatin 40mg 4my . Magnesium Oxide 400mg d24m . Interventions:  o Maintain cholesterol medication regimen.  o Consider rechecking magnesium level with next labs.   COPD (goal - reduce symptoms associated with COPD and prevent COPD exacerbations)  . GOLD Classification: 2 . Most recent Pulmonary Function Testing 12/09/2020:  . FEV1/FVC pre = 60% and FEV1 / FVC post  = 49% . FEV1 pred = 50% . no exacerbations requiring treatment in the last 6 months . Current regimen:   Incruse Ellipta 62.62mcg in24me 1 puff daily (maintenance inhaler)  Ventolin HFA as needed (rescue inhaler) . Interventions: o Discussed differences between Ventolin and Incruse Ellipta - Ventolin = Rescue Inhaler = Use as  needed for shortness of breath - Incruse Ellipta = Maintenance Inhaler = Use daily to prevent lung function decline o Maintain COPD Medication regimen o Assessed financial qualifications for Incruse PAP - patient does not meet income limit of <250% of [employer]PL.   Osteoporosis (goals: prevent fractures and maintain bone density) . Patient states she does not wish to have repeat BMD . Has h/o low calcium but last checked 01/17/2021 and was WNL . Current regimen:   Prolia 60mg adm12mtered in office every 6 months (last 01/24/2021)  Calcium + D daily   Vitamin D 500IU daily  . Interventions: o Ensure 1200mg of c68mum intake per day from supplementation and diet o Ensure 800 to 1000 units per day of vitamin D (more if you have had low vitamin D levels in past)  o Maintain current regimen for bone health  o Continue to walk daily o Fall prevention  Medication management . [facility] . Interventions o Comprehensive medication review performed. o Continue current medication management strategy  Patient Goals/Self-Care Activities . Over the next 90 days, patient will:  o take medications as prescribed o check blood pressure daily for next 2 to 3 weeks, document, and provide at future appointments o target a minimum of 150 minutes of moderate intensity exercise weekly  Follow Up Plan: Telephone follow up appointment with care management team member scheduled for:  3 months with clinical pharmacy assistance and 6 months with clinical pharmacist.      Medication Assistance: reviewed criteria for [employer] - patient does not meet income limit.   Patient's [facility] pharmacy is:  [id number] [id number] [place] [phone]:  Patient agrees to Care Plan and Follow-up.  Plan: pharmacy assistance in 3  months and clinical pharmacist in 6  months  Cherre Robins, PharmD Clinical Pharmacist [facility]

## 2021-03-25 ENCOUNTER — Other Ambulatory Visit: Payer: Self-pay | Admitting: Cardiovascular Disease

## 2021-03-29 ENCOUNTER — Other Ambulatory Visit: Payer: Self-pay

## 2021-03-29 DIAGNOSIS — Z79899 Other long term (current) drug therapy: Secondary | ICD-10-CM

## 2021-03-29 LAB — BASIC METABOLIC PANEL
BUN/Creatinine Ratio: 21 (ref 12–28)
BUN: 16 mg/dL (ref 8–27)
CO2: 26 mmol/L (ref 20–29)
Calcium: 9.6 mg/dL (ref 8.7–10.3)
Chloride: 98 mmol/L (ref 96–106)
Creatinine, Ser: 0.78 mg/dL (ref 0.57–1.00)
Glucose: 89 mg/dL (ref 65–99)
Potassium: 3.9 mmol/L (ref 3.5–5.2)
Sodium: 138 mmol/L (ref 134–144)
eGFR: 77 mL/min/{1.73_m2} (ref >59)

## 2021-04-03 ENCOUNTER — Encounter: Payer: Self-pay | Admitting: Vascular Surgery

## 2021-04-03 ENCOUNTER — Ambulatory Visit (INDEPENDENT_AMBULATORY_CARE_PROVIDER_SITE_OTHER): Payer: Medicare Other | Admitting: Vascular Surgery

## 2021-04-03 ENCOUNTER — Other Ambulatory Visit: Payer: Self-pay

## 2021-04-03 VITALS — BP 127/74 | HR 67 | Temp 99.1°F | Resp 16 | Ht 66.0 in | Wt 166.0 lb

## 2021-04-03 DIAGNOSIS — I6523 Occlusion and stenosis of bilateral carotid arteries: Secondary | ICD-10-CM | POA: Diagnosis not present

## 2021-04-03 NOTE — Progress Notes (Signed)
[facility] [place]  Patient name: Dominique [name] MRN: [id number] DOB: 1941-03-29 Sex: female  REASON FOR VISIT: Evaluation asymptomatic right carotid stenosis  HPI: Dominique [name] is a 80 y.o. female known to me from prior urgent repair of left femoral artery following TAVR in March 2020.  She looks quite good today.  She has had no further complication.  She is found to have asymptomatic carotid stenosis and has had several ultrasounds and a recent CT scan for evaluation.  She is seen today for further discussion of this.  Interestingly, she had a duplex in 2020 suggesting moderate to severe right carotid stenosis and no significant left carotid stenosis.  In May 2021 a follow-up suggested no significant stenoses bilaterally.  She underwent CT angiogram on 03/02/2021 and this reveals a 60 to 70% right carotid stenosis and no significant left carotid stenosis.  She reports that she is right-handed but feels that she actually may be left-handed.  She reports that being raised in [place], there was no choice and that she were forced to use her right hand.  Past Medical History:  Diagnosis Date  . Atrial fibrillation ([place])    a. dx 03/2019 while in hospital with cholecystitis  . COPD (chronic obstructive pulmonary disease) ([place])   . Critical lower limb ischemia (H[facility])    a. after TAVR developed ischemic L leg likely due to flap/closure, s/p emergent repair of left femoral artery with endarterectomy and Dacron patch angioplasty.  . Emphysema lung ([place])   . Hyperlipidemia    LDL goal < 70  . Hypertension   . Hypomagnesemia   . Normal coronary arteries 10/2018  . NSVT (nonsustained ventricular tachycardia) ([place]) 03/02/2019  . Pulmonary HTN ([place]) 06/05/2016   Moderate with PASP 66mmHg by echo 04/2016 likely Group 3 from COPD and possibly Group 2 from pulmonary venous HTN associated with moderate AS - f/u echo 12/2018 post TAVR showed normal  RVSP  . Pulmonary nodules    seen on pre TAVR CT, needs 12 month CT follow up  . 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 Sacral fracture ([place])   . Severe aortic stenosis    a. s/p TAVR 12/2018.  Marland Kitchen Thoracic ascending aortic aneurysm [facility])    needs yearly follow up    Family History  Problem Relation Age of Onset  . Breast cancer Mother        breast  . Diabetes Son        type 1  . Cancer Brother        lung  . Colon cancer Neg Hx   . CAD Neg Hx   . Colon polyps Neg Hx   . Rectal cancer Neg Hx   . Stomach cancer Neg Hx     SOCIAL HISTORY: Social History   Tobacco Use  . Smoking status: Former Smoker    Packs/day: 1.50    Years: 40.00    Pack years: 60.00    Types: Cigarettes    Quit date: 03/19/2004    Years since quitting: 17.0  . Smokeless tobacco: Never Used  Substance Use Topics  . Alcohol use: Yes    Alcohol/week: 3.0 - 4.0 standard drinks    Types: 3 - 4 Glasses of wine per week    Comment: socially    No Known Allergies  Current Outpatient Medications  Medication Sig  Dispense Refill  . metoprolol succinate (TOPROL-XL) 25 MG 24 hr tablet TAKE 1 TABLET BY MOUTH EVERY DAY 90 tablet 2  . acetaminophen (TYLENOL) 325 MG tablet Take 2 tablets (650 mg total) by mouth every 6 (six) hours as needed for mild pain (or Fever >/= 101). (Patient not taking: No sig reported)    . amLODipine (NORVASC) 5 MG tablet Take 1 tablet (5 mg total) by mouth daily. 180 tablet 3  . amoxicillin (AMOXIL) 500 MG tablet Take 4 tablets (2,000mg ) one hour prior to all dental visits. 28 tablet 0  . aspirin EC 81 MG tablet Take 81 mg by mouth daily.    . Calcium Carb-Cholecalciferol (CALCIUM 600 + D PO) Take 600 mg by mouth 3 (three) times a week.     . Cholecalciferol (VITAMIN D3) 5000 UNITS CAPS Take 5,000 Units by mouth daily.     Marland Kitchen denosumab (PROLIA) 60 MG/ML SOLN injection Inject  60 mg into the skin every 6 (six) months. Administer in upper arm, thigh, or abdomen    . EPINEPHrine 0.3 mg/0.3 mL IJ SOAJ injection Inject 0.3 mg into the muscle as needed for anaphylaxis.   12  . furosemide (LASIX) 20 MG tablet DAILY AS NEEDED FOR SWELLING 30 tablet 3  . hydrochlorothiazide (MICROZIDE) 12.5 MG capsule Take 1 capsule (12.5 mg total) by mouth daily. 90 capsule 3  . loratadine (CLARITIN) 10 MG tablet Take 10 mg by mouth daily as needed for allergies.     . magnesium oxide (MAG-OX) 400 MG tablet TAKE 1 TABLET (400 MG TOTAL) BY MOUTH DAILY. PT OVERDUE FOR [place] OV PLEASE CALL FOR APPT 90 tablet 3  . Multiple Vitamin (MULTIVITAMIN WITH MINERALS) TABS tablet Take 1 tablet by mouth daily.    . rosuvastatin (CRESTOR) 40 MG tablet TAKE 1 TABLET BY MOUTH DAILY 90 tablet 3  . umeclidinium bromide (INCRUSE ELLIPTA) 62.5 MCG/INH AEPB Inhale 1 puff into the lungs daily. 30 each 5  . VENTOLIN HFA 108 (90 Base) MCG/ACT inhaler TAKE 2 PUFFS BY MOUTH EVERY 6 HOURS AS NEEDED FOR WHEEZE OR SHORTNESS OF BREATH 18 g 1   No current facility-administered medications for this visit.    REVIEW OF SYSTEMS:  [X]  denotes positive finding, [ ]  denotes negative finding Cardiac  Comments:  Chest pain or chest pressure:    Shortness of breath upon exertion:    Short of breath when lying flat:    Irregular heart rhythm: x       Vascular    Pain in calf, thigh, or hip brought on by ambulation:    Pain in feet at night that wakes you up from your sleep:     Blood clot in your veins:    Leg swelling:           PHYSICAL EXAM: Vitals:   04/03/21 1352  BP: 127/74  Pulse: 67  Resp: 16  Temp: 99.1 F (37.3 C)  TempSrc: Other (Comment)  SpO2: 92%  Weight: 166 lb (75.3 kg)  Height: 5\' 6"  (1.676 m)    GENERAL: The patient is a well-nourished female, in no acute distress. The vital signs are documented above. CARDIOVASCULAR: Carotid arteries without bruits bilaterally.  2+ radial pulses  bilaterally. PULMONARY: There is good air exchange  MUSCULOSKELETAL: There are no major deformities or cyanosis. NEUROLOGIC: No focal weakness or paresthesias are detected. SKIN: There are no ulcers or rashes noted. PSYCHIATRIC: The patient has a normal affect.  DATA:  I reviewed the CT  images with the patient and her friend present.  This does show moderate to severe right carotid stenosis and no significant left carotid disease.  This is located just above the bifurcation with calcification in the artery at this location  MEDICAL ISSUES: Had a long discussion with the patient regarding the significance of this.  Explained that she is below the threshold of where we would recommend surgery for asymptomatic disease.  I did review symptoms of right carotid disease.  He knows to report to the emergency room immediately should this occur.  Otherwise we will see her in our [place] office in 1 year with repeat carotid duplex    Rosetta Posner, MD FACS [facility] [place] Office Tel [phone]  Note: Portions of this report may have been transcribed using voice recognition software.  Every effort has been made to ensure accuracy; however, inadvertent computerized transcription errors may still be present.

## 2021-04-28 ENCOUNTER — Encounter: Payer: Self-pay | Admitting: Internal Medicine

## 2021-04-28 ENCOUNTER — Ambulatory Visit (INDEPENDENT_AMBULATORY_CARE_PROVIDER_SITE_OTHER): Payer: Medicare Other | Admitting: Internal Medicine

## 2021-04-28 ENCOUNTER — Other Ambulatory Visit: Payer: Self-pay

## 2021-04-28 VITALS — BP 144/72 | HR 73 | Temp 98.0°F | Resp 16 | Ht 66.0 in | Wt 169.0 lb

## 2021-04-28 DIAGNOSIS — D539 Nutritional anemia, unspecified: Secondary | ICD-10-CM | POA: Diagnosis not present

## 2021-04-28 DIAGNOSIS — E559 Vitamin D deficiency, unspecified: Secondary | ICD-10-CM | POA: Diagnosis not present

## 2021-04-28 DIAGNOSIS — I509 Heart failure, unspecified: Secondary | ICD-10-CM

## 2021-04-28 DIAGNOSIS — J449 Chronic obstructive pulmonary disease, unspecified: Secondary | ICD-10-CM

## 2021-04-28 DIAGNOSIS — I10 Essential (primary) hypertension: Secondary | ICD-10-CM

## 2021-04-28 DIAGNOSIS — E569 Vitamin deficiency, unspecified: Secondary | ICD-10-CM | POA: Diagnosis not present

## 2021-04-28 DIAGNOSIS — I6523 Occlusion and stenosis of bilateral carotid arteries: Secondary | ICD-10-CM

## 2021-04-28 DIAGNOSIS — R5382 Chronic fatigue, unspecified: Secondary | ICD-10-CM

## 2021-04-28 LAB — CBC WITH DIFFERENTIAL/PLATELET
Basophils Absolute: 0.1 10*3/uL (ref 0.0–0.1)
Basophils Relative: 0.9 % (ref 0.0–3.0)
Eosinophils Absolute: 0.3 10*3/uL (ref 0.0–0.7)
Eosinophils Relative: 4.4 % (ref 0.0–5.0)
HCT: 41.7 % (ref 36.0–46.0)
Hemoglobin: 13.9 g/dL (ref 12.0–15.0)
Lymphocytes Relative: 25.4 % (ref 12.0–46.0)
Lymphs Abs: 1.6 10*3/uL (ref 0.7–4.0)
MCHC: 33.3 g/dL (ref 30.0–36.0)
MCV: 92.4 fl (ref 78.0–100.0)
Monocytes Absolute: 0.7 10*3/uL (ref 0.1–1.0)
Monocytes Relative: 11.3 % (ref 3.0–12.0)
Neutro Abs: 3.7 10*3/uL (ref 1.4–7.7)
Neutrophils Relative %: 58 % (ref 43.0–77.0)
Platelets: 215 10*3/uL (ref 150.0–400.0)
RBC: 4.51 Mil/uL (ref 3.87–5.11)
RDW: 13.7 % (ref 11.5–15.5)
WBC: 6.5 10*3/uL (ref 4.0–10.5)

## 2021-04-28 LAB — VITAMIN B12: Vitamin B-12: 296 pg/mL (ref 211–911)

## 2021-04-28 LAB — FOLATE: Folate: >24.4 ng/mL (ref >5.9)

## 2021-04-28 MED ORDER — BETAMETHASONE DIPROPIONATE AUG 0.05 % EX CREA: TOPICAL_CREAM | Freq: Two times a day (BID) | CUTANEOUS | 0 refills | Status: DC

## 2021-04-28 NOTE — Patient Instructions (Addendum)
Check the  blood pressure 2 or 3 times a  week   BP GOAL is between 110/65 and  135/85. If it is consistently higher or lower, let me know  Proceed with your COVID vaccines #4 at your convenience  Do not forget to get a flu shot this fall   GO TO THE LAB : Get the blood work     Dominique [name], [place] back for a checkup in 4 to 6 months

## 2021-04-28 NOTE — Progress Notes (Signed)
Subjective:    Patient ID: Dominique [name], female    DOB: 11/30/1940, 80 y.o.   MRN: [id number]  DOS:  04/28/2021 Type of visit - description: ROV Since the last office visit she saw cardiology, note reviewed. She was treated for edema, she is actually currently better. Her only concern is fatigue, described as simply lack of energy. There are days that she has several chores to do and she simply elects to stay on her chair.  Although her PHQ-9 is 9, she strongly denies any depression. Physical is doing well. No fever chills No chest pain no difficulty breathing. No cough. Ambulatory BPs are okay, no low BPs.   Review of Systems See above   Past Medical History:  Diagnosis Date   Atrial fibrillation ([place])    a. dx 03/2019 while in hospital with cholecystitis   COPD (chronic obstructive pulmonary disease) ([place])    Critical lower limb ischemia (HCC)    a. after TAVR developed ischemic L leg likely due to flap/closure, s/p emergent repair of left femoral artery with endarterectomy and Dacron patch angioplasty.   Emphysema lung (HCC)    Hyperlipidemia    LDL goal < 70   Hypertension    Hypomagnesemia    Normal coronary arteries 10/2018   NSVT (nonsustained ventricular tachycardia) ([place]) 03/02/2019   Pulmonary HTN ([place]) 06/05/2016   Moderate with PASP 14mmHg by echo 04/2016 likely Group 3 from COPD and possibly Group 2 from pulmonary venous HTN associated with moderate AS - f/u echo 12/2018 post TAVR showed normal RVSP   Pulmonary nodules    seen on pre TAVR CT, needs 12 month CT follow up   S/P TAVR (transcatheter aortic valve replacement) 12/30/2018   Medtronic Evolut Pro-Plus THV (size 26 mm, serial # [id number]) via the TF approach   Sacral fracture (HCC)    Severe aortic stenosis    a. s/p TAVR 12/2018.   Thoracic ascending aortic aneurysm ([place])    needs yearly follow up    Past Surgical History:  Procedure Laterality Date   [place] N/A 04/28/2019   Procedure: LAPAROSCOPIC CHOLECYSTECTOMY;  Surgeon: Stark Klein, MD;  Location: [facility];  Service: General;  Laterality: N/A;   COLONOSCOPY  2018   FEMORAL-POPLITEAL BYPASS GRAFT Left 12/31/2018   Procedure: PATCH ANGIOPLASTY REPAIR LEFT FEMORAL ARTERY USING [place];  Surgeon: Rosetta Posner, MD;  Location: [facility];  Service: Vascular;  Laterality: Left;   IR RADIOLOGY PERIPHERAL GUIDED IV START  05/30/2017   IR RADIOLOGY PERIPHERAL GUIDED IV START  09/02/2018   IR RADIOLOGY PERIPHERAL GUIDED IV START  09/02/2018   IR US GUIDE VASC ACCESS LEFT  09/02/2018   IR US GUIDE VASC ACCESS RIGHT  05/30/2017   IR US GUIDE VASC ACCESS RIGHT  09/02/2018   JOINT REPLACEMENT  2016   sacroplasty   LAMINECTOMY     RIGHT/LEFT HEART CATH AND CORONARY ANGIOGRAPHY N/A 11/27/2018   Procedure: RIGHT/LEFT HEART CATH AND CORONARY ANGIOGRAPHY;  Surgeon: Sherren Mocha, MD;  Location: [facility];  Service: Cardiovascular;  Laterality: N/A;   TEE WITHOUT CARDIOVERSION N/A 12/30/2018   Procedure: TRANSESOPHAGEAL ECHOCARDIOGRAM (TEE);  Surgeon: Sherren Mocha, MD;  Location: [facility];  Service: Open Heart Surgery;  Laterality: N/A;   TOTAL HIP ARTHROPLASTY Bilateral    TRANSCATHETER AORTIC VALVE REPLACEMENT, TRANSFEMORAL N/A 12/30/2018  Procedure: TRANSCATHETER AORTIC VALVE REPLACEMENT, TRANSFEMORAL;  Surgeon: Sherren Mocha, MD;  Location: [facility];  Service: Open Heart Surgery;  Laterality: N/A;   TUBAL LIGATION  1975    Allergies as of 04/28/2021   No Known Allergies      Medication List        Accurate as of April 28, 2021 11:59 PM. If you have any questions, ask your nurse or doctor.          STOP taking these medications    Tudorza Pressair 400 MCG/ACT Aepb Generic drug: Aclidinium Bromide Stopped by: Kathlene November, MD       TAKE these medications    acetaminophen 325 MG tablet Commonly known as:  TYLENOL Take 2 tablets (650 mg total) by mouth every 6 (six) hours as needed for mild pain (or Fever >/= 101).   amLODipine 5 MG tablet Commonly known as: NORVASC Take 1 tablet (5 mg total) by mouth daily.   amoxicillin 500 MG tablet Commonly known as: AMOXIL Take 4 tablets (2,000mg ) one hour prior to all dental visits.   aspirin EC 81 MG tablet Take 81 mg by mouth daily.   augmented betamethasone dipropionate 0.05 % cream Commonly known as: DIPROLENE-AF Apply topically 2 (two) times daily. Started by: Kathlene November, MD   CALCIUM 600 + D PO Take 600 mg by mouth 3 (three) times a week.   denosumab 60 MG/ML Soln injection Commonly known as: PROLIA Inject 60 mg into the skin every 6 (six) months. Administer in upper arm, thigh, or abdomen   EPINEPHrine 0.3 mg/0.3 mL Soaj injection Commonly known as: EPI-PEN Inject 0.3 mg into the muscle as needed for anaphylaxis.   furosemide 20 MG tablet Commonly known as: LASIX DAILY AS NEEDED FOR SWELLING   hydrochlorothiazide 12.5 MG capsule Commonly known as: MICROZIDE Take 1 capsule (12.5 mg total) by mouth daily.   Incruse Ellipta 62.5 MCG/INH Aepb Generic drug: umeclidinium bromide Inhale 1 puff into the lungs daily.   loratadine 10 MG tablet Commonly known as: CLARITIN Take 10 mg by mouth daily as needed for allergies.   magnesium oxide 400 MG tablet Commonly known as: MAG-OX TAKE 1 TABLET (400 MG TOTAL) BY MOUTH DAILY. PT OVERDUE FOR [place] [facility] PLEASE CALL FOR APPT   metoprolol succinate 25 MG 24 hr tablet Commonly known as: TOPROL-XL TAKE 1 TABLET BY MOUTH EVERY DAY   multivitamin with minerals Tabs tablet Take 1 tablet by mouth daily.   rosuvastatin 40 MG tablet Commonly known as: CRESTOR TAKE 1 TABLET BY MOUTH DAILY   Ventolin HFA 108 (90 Base) MCG/ACT inhaler Generic drug: albuterol TAKE 2 PUFFS BY MOUTH EVERY 6 HOURS AS NEEDED FOR WHEEZE OR SHORTNESS OF BREATH   Vitamin D3 125 MCG (5000 UT) Caps Take 5,000  Units by mouth daily.           Objective:   Physical Exam BP (!) 144/72 (BP Location: Left Arm, Patient Position: Sitting, Cuff Size: Small)   Pulse 73   Temp 98 F (36.7 C) (Oral)   Resp 16   Ht 5\' 6"  (1.676 m)   Wt 169 lb (76.7 kg)   SpO2 93%   BMI 27.28 kg/m  General:   Well developed, NAD, BMI noted. HEENT:  Normocephalic . Face symmetric, atraumatic Lungs:  CTA B Normal respiratory effort, no intercostal retractions, no accessory muscle use. Heart: RRR,  no murmur.  Lower extremities: no pretibial edema bilaterally  Skin: Not pale. Not jaundice Neurologic:  alert & oriented X3.  Speech normal, gait appropriate for age and unassisted Psych--  Cognition and judgment appear intact.  Cooperative with normal attention span and concentration.  Behavior appropriate. No anxious or depressed appearing.      Assessment       Assessment HTN Hyperlipidemia PULM: -Emphysema, quit tobacco 2006 -CT chest angio  05-2016 ----pulm nodule x2,reticular infiltrate ; f/u CT 11/2016 stable nodule, no f/u ----enlarged R P.A. and Ao -Abnormal PET scan 12-2019 CV: -DOE: (-) myoview  8-017, CT angio chest 05-2016:  no PE  -S/p TVAR 12/2018 -Paroxysmal A. fib DX 6/28/ 2020 in the context of acute cholecystitis, converted to NSR, not anticoagulated as of 05/25/2019 --Nonobstructive CAD (per coronary CTA) -Aspirin indefinitely, SBE prophylaxis with amoxicillin -MSK --DJD --Sacral Fx, sacral insufficiency, s/p sacroplasty (radiology) 07-2015 --H/o osteoporosis :  took fosamax while in [place], Fosamax rx again by ortho after sacral Fx 07-2015, never took it, first Prolia 11-14-2015 --T score 08-2015 >> normal (likely normal d/t DJD?) H/o vit D def: Normal level 09-2015   PLAN  HTN: Ambulatory BPs in the 130s/65.  Controlled, continue amlodipine, HCTZ, Lasix as needed, metoprolol.  See next Lower extremity edema: Last visit with cardiology 03/21/2021, she reported lower extremity  edema with no volume overload. Amlodipine decreased to 5 mg, started HCTZ, continue with Lasix as needed.  Follow-up BMP satisfactory.  She is better. Carotid artery disease Saw vascular surgery 04/03/2021.  Moderate to severe R carotid stenosis, no significant L disease.  Not a surgical candidate.  Recheck in 1 year Emphysema, pulmonary nodule, f/u CT chest pending. Fatigue: As described above, no depression, no physical impediments to be active.  We will check R48, folic acid otherwise recommend observation Rash: Also complains of rash, on exam she has patches of dry skin with some scabs located at the shoulders and upper back.  Rash is nonspecific and very pruritic.  Trial with Diprolene for few days. Preventive care: Recommend to proceed with COVID-vaccine #4 a flu shot this fall. RTC 4 to 6 months   This visit occurred during the SARS-CoV-2 public health emergency.  Safety protocols were in place, including screening questions prior to the visit, additional usage of staff PPE, and extensive cleaning of exam room while observing appropriate contact time as indicated for disinfecting solutions.

## 2021-04-29 NOTE — Assessment & Plan Note (Signed)
HTN: Ambulatory BPs in the 130s/65.  Controlled, continue amlodipine, HCTZ, Lasix as needed, metoprolol.  See next Lower extremity edema: Last visit with cardiology 03/21/2021, she reported lower extremity edema with no volume overload. Amlodipine decreased to 5 mg, started HCTZ, continue with Lasix as needed.  Follow-up BMP satisfactory.  She is better. Carotid artery disease Saw vascular surgery 04/03/2021.  Moderate to severe R carotid stenosis, no significant L disease.  Not a surgical candidate.  Recheck in 1 year Emphysema, pulmonary nodule, f/u CT chest pending. Fatigue: As described above, no depression, no physical impediments to be active.  We will check H06, folic acid otherwise recommend observation Rash: Also complains of rash, on exam she has patches of dry skin with some scabs located at the shoulders and upper back.  Rash is nonspecific and very pruritic.  Trial with Diprolene for few days. Preventive care: Recommend to proceed with COVID-vaccine #4 a flu shot this fall. RTC 4 to 6 months

## 2021-05-02 ENCOUNTER — Ambulatory Visit
Admission: RE | Admit: 2021-05-02 | Discharge: 2021-05-02 | Disposition: A | Discharge status: Home / Self Care | Payer: Medicare Other | Source: Ambulatory Visit | Attending: Adult Health | Admitting: Adult Health

## 2021-05-02 DIAGNOSIS — R918 Other nonspecific abnormal finding of lung field: Secondary | ICD-10-CM

## 2021-05-02 DIAGNOSIS — J439 Emphysema, unspecified: Secondary | ICD-10-CM | POA: Diagnosis not present

## 2021-05-02 DIAGNOSIS — R911 Solitary pulmonary nodule: Secondary | ICD-10-CM | POA: Diagnosis not present

## 2021-05-02 DIAGNOSIS — I700 Atherosclerosis of aorta: Secondary | ICD-10-CM | POA: Diagnosis not present

## 2021-05-05 NOTE — Progress Notes (Signed)
ATC x1, left vm to return call regarding CT results.

## 2021-05-07 DIAGNOSIS — Z20822 Contact with and (suspected) exposure to covid-19: Secondary | ICD-10-CM | POA: Diagnosis not present

## 2021-05-08 ENCOUNTER — Other Ambulatory Visit: Payer: Self-pay

## 2021-05-08 ENCOUNTER — Ambulatory Visit (INDEPENDENT_AMBULATORY_CARE_PROVIDER_SITE_OTHER): Payer: Medicare Other | Admitting: Pulmonary Disease

## 2021-05-08 ENCOUNTER — Encounter: Payer: Self-pay | Admitting: Pulmonary Disease

## 2021-05-08 VITALS — BP 158/68 | HR 62 | Ht 66.0 in | Wt 165.0 lb

## 2021-05-08 DIAGNOSIS — J449 Chronic obstructive pulmonary disease, unspecified: Secondary | ICD-10-CM | POA: Diagnosis not present

## 2021-05-08 DIAGNOSIS — R918 Other nonspecific abnormal finding of lung field: Secondary | ICD-10-CM

## 2021-05-08 DIAGNOSIS — R942 Abnormal results of pulmonary function studies: Secondary | ICD-10-CM

## 2021-05-08 NOTE — Progress Notes (Signed)
Synopsis: Referred in April 2021 for abnormal CT by Colon Branch, MD  Subjective:   PATIENT ID: Dominique [name] GENDER: female DOB: Jul 15, 1941, MRN: [id number]  Chief Complaint  Patient presents with   Lung Mass    Follow up CT    This is a 80 year old female with a past medical history of COPD, atrial fibrillation, TAVR placement by Dr. [name] last year, hypertension moderate pulmonary hypertension.  Patient had CT imaging of the chest which revealed multiple bilateral lung nodules.  Some of which had groundglass areas others had in the left upper lobe adjacent to the pleura more solid component.  Patient underwent PET scan imaging which revealed left upper lobe nodule with an SUV of 10.  Other groundglass areas had low-level PET uptake.  There was some concern for potential infectious etiology.  Therefore patient was referred to pulmonary for recommendations and management.  At this time patient has no significant complaints.  She used to be a former smoker quit many years ago.  60-pack-year history quit in 2005.  OV 05/08/2021: Here today for follow-up after recent CT imaging.  We have been following her lung nodules for some time.  She had a left upper lobe subpleural nodule along the medial surface with an SUV of 10 with multiple other areas of surrounding groundglass and subsolid nodules.  Patient's most recent CT imaging was in July 2022.  This was a 108-month follow-up from her December 2021 imaging.  This still demonstrates a mixed subsolid groundglass nodular lesion within the posterior left upper lobe that tends the major fissure this is stable in size.  Additionally there is a small unchanged right pulmonary apex groundglass opacity.  There is a more medial left upper lobe subpleural nodule that was not really discussed in this July 2022 follow-up but was of concern and a PET scan that date back to March 2021 which had an SUV of 10.  At the time was felt to be infectious but on subsequent  follow-up imaging reveals that this nodule remains persistent.  From respiratory standpoint she is doing well.  Unfortunately anxious about everything that is going on.     Past Medical History:  Diagnosis Date   Atrial fibrillation ([place])    a. dx 03/2019 while in hospital with cholecystitis   COPD (chronic obstructive pulmonary disease) ([place])    Critical lower limb ischemia (HCC)    a. after TAVR developed ischemic L leg likely due to flap/closure, s/p emergent repair of left femoral artery with endarterectomy and Dacron patch angioplasty.   Emphysema lung (HCC)    Hyperlipidemia    LDL goal < 70   Hypertension    Hypomagnesemia    Normal coronary arteries 10/2018   NSVT (nonsustained ventricular tachycardia) ([place]) 03/02/2019   Pulmonary HTN ([place]) 06/05/2016   Moderate with PASP 27mmHg by echo 04/2016 likely Group 3 from COPD and possibly Group 2 from pulmonary venous HTN associated with moderate AS - f/u echo 12/2018 post TAVR showed normal RVSP   Pulmonary nodules    seen on pre TAVR CT, needs 12 month CT follow up   S/P TAVR (transcatheter aortic valve replacement) 12/30/2018   Medtronic Evolut Pro-Plus THV (size 26 mm, serial # [id number]) via the TF approach   Sacral fracture (HCC)    Severe aortic stenosis    a. s/p TAVR 12/2018.   Thoracic ascending aortic aneurysm [facility])    needs yearly follow up     Family History  Problem  Relation Age of Onset   Breast cancer Mother        breast   Diabetes Son        type 1   Cancer Brother        lung   Colon cancer Neg Hx    CAD Neg Hx    Colon polyps Neg Hx    Rectal cancer Neg Hx    Stomach cancer Neg Hx      Past Surgical History:  Procedure Laterality Date   APPENDECTOMY  1951   CATARACT EXTRACTION     CESAREAN SECTION     1971   CHOLECYSTECTOMY N/A 04/28/2019   Procedure: LAPAROSCOPIC CHOLECYSTECTOMY;  Surgeon: Stark Klein, MD;  Location: [facility];  Service: General;  Laterality: N/A;   COLONOSCOPY  2018    FEMORAL-POPLITEAL BYPASS GRAFT Left 12/31/2018   Procedure: PATCH ANGIOPLASTY REPAIR LEFT FEMORAL ARTERY USING HEMASHIELD PLATINUM FINESSE PATCH;  Surgeon: Rosetta Posner, MD;  Location: [place];  Service: Vascular;  Laterality: Left;   IR RADIOLOGY PERIPHERAL GUIDED IV START  05/30/2017   IR RADIOLOGY PERIPHERAL GUIDED IV START  09/02/2018   IR RADIOLOGY PERIPHERAL GUIDED IV START  09/02/2018   IR US GUIDE VASC ACCESS LEFT  09/02/2018   IR US GUIDE VASC ACCESS RIGHT  05/30/2017   IR US GUIDE [place] RIGHT  09/02/2018   JOINT REPLACEMENT  2016   sacroplasty   LAMINECTOMY     RIGHT/LEFT HEART CATH AND CORONARY ANGIOGRAPHY N/A 11/27/2018   Procedure: RIGHT/LEFT HEART CATH AND CORONARY ANGIOGRAPHY;  Surgeon: Sherren Mocha, MD;  Location: [place] [facility];  Service: Cardiovascular;  Laterality: N/A;   TEE WITHOUT CARDIOVERSION N/A 12/30/2018   Procedure: TRANSESOPHAGEAL ECHOCARDIOGRAM (TEE);  Surgeon: Sherren Mocha, MD;  Location: [place] [facility];  Service: Open Heart Surgery;  Laterality: N/A;   TOTAL HIP ARTHROPLASTY Bilateral    TRANSCATHETER AORTIC VALVE REPLACEMENT, TRANSFEMORAL N/A 12/30/2018   Procedure: TRANSCATHETER AORTIC VALVE REPLACEMENT, TRANSFEMORAL;  Surgeon: Sherren Mocha, MD;  Location: [place] [facility];  Service: Open Heart Surgery;  Laterality: N/A;   TUBAL LIGATION  1975    Social History   Socioeconomic History   Marital status: Widowed    Spouse name: Not on file   Number of children: 2   Years of education: Not on file   Highest education level: Not on file  Occupational History   Occupation: n/a  Tobacco Use   Smoking status: Former    Packs/day: 1.50    Years: 40.00    Pack years: 60.00    Types: Cigarettes    Start date: 60    Quit date: 03/19/2004    Years since quitting: 17.1   Smokeless tobacco: Never   Tobacco comments:    Started smoking at age 54  Vaping Use   Vaping Use: Never used  Substance and Sexual Activity   Alcohol use: Yes     Alcohol/week: 3.0 - 4.0 standard drinks    Types: 3 - 4 Glasses of wine per week    Comment: socially   Drug use: No   Sexual activity: Not on file  Other Topics Concern   Not on file  Social History Narrative   From [place]   Lost a son to DM   1 living son   Social Determinants of Health   Financial Resource Strain: Low Risk    Difficulty of Paying Living Expenses: Not very hard  Food Insecurity: No Food Insecurity  Worried About Charity fundraiser in the Last Year: Never true   [place] in the Last Year: Never true  Transportation Needs: No Transportation Needs   Lack of Transportation (Medical): No   Lack of Transportation (Non-Medical): No  Physical Activity: Insufficiently Active   Days of Exercise per Week: 7 days   Minutes of Exercise per Session: 20 min  Stress: Not on file  Social Connections: Not on file  Intimate Partner Violence: Not on file     No Known Allergies   Outpatient Medications Prior to Visit  Medication Sig Dispense Refill   acetaminophen (TYLENOL) 325 MG tablet Take 2 tablets (650 mg total) by mouth every 6 (six) hours as needed for mild pain (or Fever >/= 101).     amLODipine (NORVASC) 5 MG tablet Take 1 tablet (5 mg total) by mouth daily. 180 tablet 3   amoxicillin (AMOXIL) 500 MG tablet Take 4 tablets (2,000mg ) one hour prior to all dental visits. 28 tablet 0   aspirin EC 81 MG tablet Take 81 mg by mouth daily.     augmented betamethasone dipropionate (DIPROLENE-AF) 0.05 % cream Apply topically 2 (two) times daily. 60 g 0   Calcium Carb-Cholecalciferol (CALCIUM 600 + D PO) Take 600 mg by mouth 3 (three) times a week.      Cholecalciferol (VITAMIN D3) 5000 UNITS CAPS Take 5,000 Units by mouth daily.      denosumab (PROLIA) 60 MG/ML SOLN injection Inject 60 mg into the skin every 6 (six) months. Administer in upper arm, thigh, or abdomen     EPINEPHrine 0.3 mg/0.3 mL IJ SOAJ injection Inject 0.3 mg into the muscle as needed for  anaphylaxis.  12   furosemide (LASIX) 20 MG tablet DAILY AS NEEDED FOR SWELLING 30 tablet 3   hydrochlorothiazide (MICROZIDE) 12.5 MG capsule Take 1 capsule (12.5 mg total) by mouth daily. 90 capsule 3   loratadine (CLARITIN) 10 MG tablet Take 10 mg by mouth daily as needed for allergies.      magnesium oxide (MAG-OX) 400 MG tablet TAKE 1 TABLET (400 MG TOTAL) BY MOUTH DAILY. PT OVERDUE FOR  OV PLEASE CALL FOR APPT 90 tablet 3   metoprolol succinate (TOPROL-XL) 25 MG 24 hr tablet TAKE 1 TABLET BY MOUTH EVERY DAY 90 tablet 2   Multiple Vitamin (MULTIVITAMIN WITH MINERALS) TABS tablet Take 1 tablet by mouth daily.     rosuvastatin (CRESTOR) 40 MG tablet TAKE 1 TABLET BY MOUTH DAILY 90 tablet 3   umeclidinium bromide (INCRUSE ELLIPTA) 62.5 MCG/INH AEPB Inhale 1 puff into the lungs daily. 30 each 5   VENTOLIN HFA 108 (90 Base) MCG/ACT inhaler TAKE 2 PUFFS BY MOUTH EVERY 6 HOURS AS NEEDED FOR WHEEZE OR SHORTNESS OF BREATH 18 g 1   No facility-administered medications prior to visit.    Review of Systems  Constitutional:  Negative for chills, fever, malaise/fatigue and weight loss.  HENT:  Negative for hearing loss, sore throat and tinnitus.   Eyes:  Negative for blurred vision and double vision.  Respiratory:  Negative for cough, hemoptysis, sputum production, shortness of breath, wheezing and stridor.   Cardiovascular:  Negative for chest pain, palpitations, orthopnea, leg swelling and PND.  Gastrointestinal:  Negative for abdominal pain, constipation, diarrhea, heartburn, nausea and vomiting.  Genitourinary:  Negative for dysuria, hematuria and urgency.  Musculoskeletal:  Negative for joint pain and myalgias.  Skin:  Negative for itching and rash.  Neurological:  Negative for dizziness, tingling,  weakness and headaches.  Endo/Heme/Allergies:  Negative for environmental allergies. Does not bruise/bleed easily.  Psychiatric/Behavioral:  Negative for depression. The patient is not  nervous/anxious and does not have insomnia.   All other systems reviewed and are negative.   Objective:  Physical Exam Vitals reviewed.  Constitutional:      General: She is not in acute distress.    Appearance: She is well-developed.  HENT:     Head: Normocephalic and atraumatic.  Eyes:     General: No scleral icterus.    Conjunctiva/sclera: Conjunctivae normal.     Pupils: Pupils are equal, round, and reactive to light.  Neck:     Vascular: No JVD.     Trachea: No tracheal deviation.  Cardiovascular:     Rate and Rhythm: Normal rate and regular rhythm.     Heart sounds: Normal heart sounds. No murmur heard. Pulmonary:     Effort: Pulmonary effort is normal. No tachypnea, accessory muscle usage or respiratory distress.     Breath sounds: No stridor. No wheezing, rhonchi or rales.  Abdominal:     General: Bowel sounds are normal. There is no distension.     Palpations: Abdomen is soft.     Tenderness: There is no abdominal tenderness.  Musculoskeletal:        General: No tenderness.     Cervical back: Neck supple.  Lymphadenopathy:     Cervical: No cervical adenopathy.  Skin:    General: Skin is warm and dry.     Capillary Refill: Capillary refill takes less than 2 seconds.     Findings: No rash.  Neurological:     Mental Status: She is alert and oriented to person, place, and time.  Psychiatric:        Behavior: Behavior normal.     Vitals:   05/08/21 1011  BP: (!) 158/68  Pulse: 62  SpO2: 96%  Weight: 165 lb (74.8 kg)  Height: 5\' 6"  (1.676 m)   96% on RA BMI Readings from Last 3 Encounters:  05/08/21 26.63 kg/m  04/28/21 27.28 kg/m  04/03/21 26.79 kg/m   Wt Readings from Last 3 Encounters:  05/08/21 165 lb (74.8 kg)  04/28/21 169 lb (76.7 kg)  04/03/21 166 lb (75.3 kg)     CBC    Component Value Date/Time   WBC 6.5 04/28/2021 1309   RBC 4.51 04/28/2021 1309   HGB 13.9 04/28/2021 1309   HGB 14.9 07/21/2020 1107   HCT 41.7 04/28/2021 1309    HCT 45.9 07/21/2020 1107   PLT 215.0 04/28/2021 1309   PLT 236 07/21/2020 1107   MCV 92.4 04/28/2021 1309   MCV 95 07/21/2020 1107   MCH 30.7 07/21/2020 1107   MCH 29.0 06/08/2019 1525   MCHC 33.3 04/28/2021 1309   RDW 13.7 04/28/2021 1309   RDW 12.8 07/21/2020 1107   LYMPHSABS 1.6 04/28/2021 1309   LYMPHSABS 1.4 11/13/2018 1310   MONOABS 0.7 04/28/2021 1309   EOSABS 0.3 04/28/2021 1309   EOSABS 0.5 (H) 11/13/2018 1310   BASOSABS 0.1 04/28/2021 1309   BASOSABS 0.1 11/13/2018 1310     Chest Imaging: 01/14/2020: CT chest Subsolid 2.4 cm pulmonary nodule concerning for potential malignancy scattered groundglass nodule within the right upper lobe.  01/27/2020 nuclear medicine pet imaging abnormal PET activity within the left upper lobe does not go above mediastinal blood pool however does not exclude low-grade adenocarcinoma. Also has clustered nodularity within the left upper lobe concerning for potential inflammatory disorder.  Or  infection. The patient's images have been independently reviewed by me.    05/02/2021 CT chest: Bilateral mixed solid and subsolid groundglass opacities both within the upper lobes.  The left upper lobe medial subpleural nodule is still present. In comparison to previous PET scan results of this lesion is still there since March 2021 was PET avid thn. The patient's images have been independently reviewed by me.     Pulmonary Functions Testing Results: PFT Results Latest Ref Rng & Units 12/09/2018 06/18/2016  FVC-Pre L 1.77 2.08  FVC-Predicted Pre % 62 72  FVC-Post L 2.17 2.35  FVC-Predicted Post % 77 81  Pre FEV1/FVC % % 60 53  Post FEV1/FCV % % 49 53  FEV1-Pre L 1.06 1.11  FEV1-Predicted Pre % 50 51  FEV1-Post L 1.07 1.25  DLCO uncorrected ml/min/mmHg 8.82 10.71  DLCO UNC% % 45 41  DLVA Predicted % 63 60  TLC L 5.43 4.69  TLC % Predicted % 104 90  RV % Predicted % 145 99    FeNO: none  Pathology: none  Echocardiogram: none  Heart  Catheterization: none    Assessment & Plan:     ICD-10-CM   1. Multiple lung nodules  R91.8 NM PET Image Initial (PI) Skull Base To Thigh    2. Abnormal CT scan of lung  R91.8     3. Abnormal PET of left lung  R94.2 NM PET Image Initial (PI) Skull Base To Thigh    4. COPD GOLD II  J44.9       Assessment:    This is an 80 year old female, with abnormal CT imaging abnormal pet imaging.  She has multiple bilateral upper lobe subsolid groundglass opacities.  1 that located along the fissure line that tends the fissure.  Unclear if these relate to an indolent adenocarcinomas.  She does have a longstanding history of tobacco use.  Additionally she has a left upper lobe medial subpleural lesion that is very small.  On a previous PET scan had an SUV of 10.  He was felt to be potentially infectious was followed up on CT imaging that remained stable and small in size.  However on this image to me appears slightly more predominant with [place] another smaller lesion adjacent to this.  Plan: I think next best steps would involve repeating her PET scan. I would like to see if this left upper lobe medial lesion is still PET avid. If it is I think it would be worrisome for a malignancy. Of the would then discuss whether or not undergo bronchoscopy. At the same time when she undergoes bronchoscopy we can consider sampling areas of the groundglass opacities to see if were dealing with an indolent adenocarcinoma in these locations. Overall her case is rather complicated. We discussed this in detail today.  Orders placed for PET scan.  Patient to follow-up with me on June 28 after the PET scan. Okay to use nodule slot 15-minute follow-up.     Current Outpatient Medications:    acetaminophen (TYLENOL) 325 MG tablet, Take 2 tablets (650 mg total) by mouth every 6 (six) hours as needed for mild pain (or Fever >/= 101)., Disp:  , Rfl:    amLODipine (NORVASC) 5 MG tablet, Take 1 tablet (5 mg total) by  mouth daily., Disp: 180 tablet, Rfl: 3   amoxicillin (AMOXIL) 500 MG tablet, Take 4 tablets (2,000mg ) one hour prior to all dental visits., Disp: 28 tablet, Rfl: 0   aspirin EC 81 MG tablet, Take  81 mg by mouth daily., Disp: , Rfl:    augmented betamethasone dipropionate (DIPROLENE-AF) 0.05 % cream, Apply topically 2 (two) times daily., Disp: 60 g, Rfl: 0   Calcium Carb-Cholecalciferol (CALCIUM 600 + D PO), Take 600 mg by mouth 3 (three) times a week. , Disp: , Rfl:    Cholecalciferol (VITAMIN D3) 5000 UNITS CAPS, Take 5,000 Units by mouth daily. , Disp: , Rfl:    denosumab (PROLIA) 60 MG/ML SOLN injection, Inject 60 mg into the skin every 6 (six) months. Administer in upper arm, thigh, or abdomen, Disp: , Rfl:    EPINEPHrine 0.3 mg/0.3 mL IJ SOAJ injection, Inject 0.3 mg into the muscle as needed for anaphylaxis., Disp: , Rfl: 12   furosemide (LASIX) 20 MG tablet, DAILY AS NEEDED FOR SWELLING, Disp: 30 tablet, Rfl: 3   hydrochlorothiazide (MICROZIDE) 12.5 MG capsule, Take 1 capsule (12.5 mg total) by mouth daily., Disp: 90 capsule, Rfl: 3   loratadine (CLARITIN) 10 MG tablet, Take 10 mg by mouth daily as needed for allergies. , Disp: , Rfl:    magnesium oxide (MAG-OX) 400 MG tablet, TAKE 1 TABLET (400 MG TOTAL) BY MOUTH DAILY. PT OVERDUE FOR [place] OV PLEASE CALL FOR APPT, Disp: 90 tablet, Rfl: 3   metoprolol succinate (TOPROL-XL) 25 MG 24 hr tablet, TAKE 1 TABLET BY MOUTH EVERY DAY, Disp: 90 tablet, Rfl: 2   Multiple Vitamin (MULTIVITAMIN WITH MINERALS) TABS tablet, Take 1 tablet by mouth daily., Disp: , Rfl:    rosuvastatin (CRESTOR) 40 MG tablet, TAKE 1 TABLET BY MOUTH DAILY, Disp: 90 tablet, Rfl: 3   umeclidinium bromide (INCRUSE ELLIPTA) 62.5 MCG/INH AEPB, Inhale 1 puff into the lungs daily., Disp: 30 each, Rfl: 5   VENTOLIN HFA 108 (90 Base) MCG/ACT inhaler, TAKE 2 PUFFS BY MOUTH EVERY 6 HOURS AS NEEDED FOR WHEEZE OR SHORTNESS OF BREATH, Disp: 18 g, Rfl: 1    Garner Nash, DO [place]  Pulmonary Critical Care 05/08/2021 10:30 AM

## 2021-05-08 NOTE — Addendum Note (Signed)
Addended by: Amado Coe on: 05/08/2021 02:24 PM   Modules accepted: Orders

## 2021-05-08 NOTE — Patient Instructions (Addendum)
Thank you for visiting Dr. Valeta Harms at [facility]. Today we recommend the following:  Orders Placed This Encounter  Procedures   NM PET Image Initial (PI) Skull Base To Thigh   See me in clinic after PET scan complete to discuss next steps.   Return in about 17 days (around 05/25/2021).    Please do your part to reduce the spread of COVID-19.

## 2021-05-20 ENCOUNTER — Encounter (HOSPITAL_BASED_OUTPATIENT_CLINIC_OR_DEPARTMENT_OTHER): Payer: Self-pay

## 2021-05-20 ENCOUNTER — Other Ambulatory Visit: Payer: Self-pay

## 2021-05-20 ENCOUNTER — Emergency Department (HOSPITAL_COMMUNITY): Payer: Medicare Other

## 2021-05-20 ENCOUNTER — Emergency Department (HOSPITAL_BASED_OUTPATIENT_CLINIC_OR_DEPARTMENT_OTHER)
Admission: EM | Admit: 2021-05-20 | Discharge: 2021-05-20 | Disposition: A | Discharge status: Home / Self Care | Payer: Medicare Other | Attending: Emergency Medicine | Admitting: Emergency Medicine

## 2021-05-20 DIAGNOSIS — K1120 Sialoadenitis, unspecified: Secondary | ICD-10-CM | POA: Diagnosis not present

## 2021-05-20 DIAGNOSIS — Z79899 Other long term (current) drug therapy: Secondary | ICD-10-CM | POA: Insufficient documentation

## 2021-05-20 DIAGNOSIS — Z7982 Long term (current) use of aspirin: Secondary | ICD-10-CM | POA: Diagnosis not present

## 2021-05-20 DIAGNOSIS — J449 Chronic obstructive pulmonary disease, unspecified: Secondary | ICD-10-CM | POA: Insufficient documentation

## 2021-05-20 DIAGNOSIS — Z20822 Contact with and (suspected) exposure to covid-19: Secondary | ICD-10-CM | POA: Insufficient documentation

## 2021-05-20 DIAGNOSIS — K111 Hypertrophy of salivary gland: Secondary | ICD-10-CM | POA: Diagnosis not present

## 2021-05-20 DIAGNOSIS — I10 Essential (primary) hypertension: Secondary | ICD-10-CM | POA: Diagnosis not present

## 2021-05-20 DIAGNOSIS — I2510 Atherosclerotic heart disease of native coronary artery without angina pectoris: Secondary | ICD-10-CM | POA: Diagnosis not present

## 2021-05-20 DIAGNOSIS — R220 Localized swelling, mass and lump, head: Secondary | ICD-10-CM | POA: Diagnosis not present

## 2021-05-20 DIAGNOSIS — Z87891 Personal history of nicotine dependence: Secondary | ICD-10-CM | POA: Diagnosis not present

## 2021-05-20 DIAGNOSIS — Z96643 Presence of artificial hip joint, bilateral: Secondary | ICD-10-CM | POA: Diagnosis not present

## 2021-05-20 DIAGNOSIS — K1121 Acute sialoadenitis: Secondary | ICD-10-CM

## 2021-05-20 DIAGNOSIS — M5030 Other cervical disc degeneration, unspecified cervical region: Secondary | ICD-10-CM | POA: Diagnosis not present

## 2021-05-20 DIAGNOSIS — R221 Localized swelling, mass and lump, neck: Secondary | ICD-10-CM | POA: Diagnosis present

## 2021-05-20 LAB — CBC WITH DIFFERENTIAL/PLATELET
Abs Immature Granulocytes: 0.05 10*3/uL (ref 0.00–0.07)
Basophils Absolute: 0 10*3/uL (ref 0.0–0.1)
Basophils Relative: 0 %
Eosinophils Absolute: 0.2 10*3/uL (ref 0.0–0.5)
Eosinophils Relative: 2 %
HCT: 43.5 % (ref 36.0–46.0)
Hemoglobin: 14.7 g/dL (ref 12.0–15.0)
Immature Granulocytes: 1 %
Lymphocytes Relative: 12 %
Lymphs Abs: 1.3 10*3/uL (ref 0.7–4.0)
MCH: 31.1 pg (ref 26.0–34.0)
MCHC: 33.8 g/dL (ref 30.0–36.0)
MCV: 92.2 fL (ref 80.0–100.0)
Monocytes Absolute: 1 10*3/uL (ref 0.1–1.0)
Monocytes Relative: 9 %
Neutro Abs: 8.4 10*3/uL — ABNORMAL HIGH (ref 1.7–7.7)
Neutrophils Relative %: 76 %
Platelets: 238 10*3/uL (ref 150–400)
RBC: 4.72 MIL/uL (ref 3.87–5.11)
RDW: 13 % (ref 11.5–15.5)
WBC: 11 10*3/uL — ABNORMAL HIGH (ref 4.0–10.5)
nRBC: 0 % (ref 0.0–0.2)

## 2021-05-20 LAB — RESP PANEL BY RT-PCR (FLU A&B, COVID) ARPGX2
Influenza A by PCR: NEGATIVE
Influenza B by PCR: NEGATIVE
SARS Coronavirus 2 by RT PCR: NEGATIVE

## 2021-05-20 LAB — BASIC METABOLIC PANEL
Anion gap: 9 (ref 5–15)
BUN: 16 mg/dL (ref 8–23)
CO2: 28 mmol/L (ref 22–32)
Calcium: 9.1 mg/dL (ref 8.9–10.3)
Chloride: 100 mmol/L (ref 98–111)
Creatinine, Ser: 0.7 mg/dL (ref 0.44–1.00)
GFR, Estimated: >60 mL/min (ref >60)
Glucose, Bld: 106 mg/dL — ABNORMAL HIGH (ref 70–99)
Potassium: 3.7 mmol/L (ref 3.5–5.1)
Sodium: 137 mmol/L (ref 135–145)

## 2021-05-20 MED ORDER — AMOXICILLIN-POT CLAVULANATE 875-125 MG PO TABS: 1.0000 | ORAL_TABLET | Freq: Two times a day (BID) | ORAL | 0 refills | Status: AC

## 2021-05-20 MED ORDER — IOHEXOL 350 MG/ML SOLN
75.0000 mL | Freq: Once | INTRAVENOUS | Status: AC | PRN
  Administered 2021-05-20: 75 mL via INTRAVENOUS

## 2021-05-20 NOTE — ED Triage Notes (Addendum)
Pt noted to have swelling to left side of face and under jaw since Wednesday, states is worsening. States has sore throat & pain to face. Tender to palpation.

## 2021-05-20 NOTE — ED Notes (Signed)
IV in left hand secured with kerlix for patient to go by POV to [facility].

## 2021-05-20 NOTE — ED Provider Notes (Signed)
80 year old female sent from [place] for CT for facial swelling, See prior note for complete H&P. Physical Exam  BP 139/74 (BP Location: Left Arm)   Pulse 84   Temp 98.4 F (36.9 C) (Oral)   Resp 18   Ht '5\' 6"'$  (1.676 m)   Wt 74.8 kg   SpO2 94%   BMI 26.63 kg/m   Physical Exam  ED Course/Procedures     Procedures  MDM  Found to have acute parotitis.  Plan is to treat with Augmentin.  Recommend follow-up with ENT.       Tacy Learn, PA-C 05/20/21 1553    Dorie Rank, MD 05/22/21 [phone]

## 2021-05-20 NOTE — ED Notes (Signed)
Report given to [facility]. Charge nurse [facility], Patient going via POV.

## 2021-05-20 NOTE — ED Provider Notes (Signed)
[facility] EMERGENCY DEPARTMENT Provider Note   CSN: [id number] Arrival date & time: 05/20/21  1030    History Chief Complaint  Patient presents with   Facial Swelling    Dominique [name] is a 80 y.o. female with past medical history significant for A. fib, hypertension, V. tach, TAVR, no anticoagulation who presents for evaluation of left-sided facial and neck swelling.  Began 2 days ago.  States swelling has worsened.  Has not noted some overlying redness to the left side of face.  Feels like she has a sore throat.  She has been able to tolerate p.o. intake at home.  No fever, chills, nausea, vomiting, neck stiffness, drooling, dysphagia, trismus, chest pain, shortness of breath.  She denies any sensation of throat closing.  No history of parotid stones.  Rates her pain a 6/10. No dental pain. Denies any additional aggravating or alleviating factors  History obtained patient and past medical records. No interpreter used.  HPI     Past Medical History:  Diagnosis Date   Atrial fibrillation ([place])    a. dx 03/2019 while in hospital with cholecystitis   COPD (chronic obstructive pulmonary disease) ([place])    Critical lower limb ischemia (HCC)    a. after TAVR developed ischemic L leg likely due to flap/closure, s/p emergent repair of left femoral artery with endarterectomy and Dacron patch angioplasty.   Emphysema lung (HCC)    Hyperlipidemia    LDL goal < 70   Hypertension    Hypomagnesemia    Normal coronary arteries 10/2018   NSVT (nonsustained ventricular tachycardia) ([place]) 03/02/2019   Pulmonary HTN ([place]) 06/05/2016   Moderate with PASP 21mHg by echo 04/2016 likely Group 3 from COPD and possibly Group 2 from pulmonary venous HTN associated with moderate AS - f/u echo 12/2018 post TAVR showed normal RVSP   Pulmonary nodules    seen on pre TAVR CT, needs 12 month CT follow up   S/P TAVR (transcatheter aortic valve replacement) 12/30/2018   Medtronic Evolut Pro-Plus THV  (size 26 mm, serial # [id number] via the TF approach   Sacral fracture (HCC)    Severe aortic stenosis    a. s/p TAVR 12/2018.   Thoracic ascending aortic aneurysm ([facility]    needs yearly follow up    Patient Active Problem List   Diagnosis Date Noted   Carotid stenosis, bilateral 03/23/2020   Osteoporosis 02/20/2020   Atypical migraine 10/20/2019   NSVT (nonsustained ventricular tachycardia) ([place] 03/02/2019   Ischemic leg 01/01/2019   Thoracic ascending aortic aneurysm ([place] 12/30/2018   S/P TAVR (transcatheter aortic valve replacement) 12/30/2018   CAD (coronary artery disease), native coronary artery    Multiple lung nodules 06/15/2016   Severe aortic stenosis    PCP NOTES >>>>>>> 10/15/2015   Vitamin D deficiency 10/14/2015   Sacral fracture ([place]    Dyspnea 04/12/2015   History of colonic polyps 03/30/2015   Hypocalcemia 12/27/2014   Annual physical exam 09/27/2014   Essential hypertension 03/19/2014   Hyperlipidemia 03/19/2014   COPD GOLD II 03/19/2014    Past Surgical History:  Procedure Laterality Date   AOlarN/A 04/28/2019   Procedure: LAPAROSCOPIC CHOLECYSTECTOMY;  Surgeon: BStark Klein MD;  [facility];  Service: General;  Laterality: N/A;   COLONOSCOPY  2018   FEMORAL-POPLITEAL BYPASS GRAFT Left 12/31/2018   Procedure: PATCH ANGIOPLASTY REPAIR LEFT FEMORAL  ARTERY USING HEMASHIELD PLATINUM FINESSE PATCH;  Surgeon: Rosetta Posner, MD;  Location: [place];  Service: Vascular;  Laterality: Left;   IR RADIOLOGY PERIPHERAL GUIDED IV START  05/30/2017   IR RADIOLOGY PERIPHERAL GUIDED IV START  09/02/2018   IR RADIOLOGY PERIPHERAL GUIDED IV START  09/02/2018   IR US GUIDE VASC ACCESS LEFT  09/02/2018   IR US GUIDE VASC ACCESS RIGHT  05/30/2017   IR US GUIDE VASC ACCESS RIGHT  09/02/2018   JOINT REPLACEMENT  2016   sacroplasty   LAMINECTOMY     RIGHT/LEFT HEART CATH AND CORONARY ANGIOGRAPHY  N/A 11/27/2018   Procedure: RIGHT/LEFT HEART CATH AND CORONARY ANGIOGRAPHY;  Surgeon: Sherren Mocha, MD;  Location: [facility];  Service: Cardiovascular;  Laterality: N/A;   TEE WITHOUT CARDIOVERSION N/A 12/30/2018   Procedure: TRANSESOPHAGEAL ECHOCARDIOGRAM (TEE);  Surgeon: Sherren Mocha, MD;  Location: [facility];  Service: Open Heart Surgery;  Laterality: N/A;   TOTAL HIP ARTHROPLASTY Bilateral    TRANSCATHETER AORTIC VALVE REPLACEMENT, TRANSFEMORAL N/A 12/30/2018   Procedure: TRANSCATHETER AORTIC VALVE REPLACEMENT, TRANSFEMORAL;  Surgeon: Sherren Mocha, MD;  Location: [facility];  Service: Open Heart Surgery;  Laterality: N/A;   TUBAL LIGATION  1975     OB History   No obstetric history on file.     Family History  Problem Relation Age of Onset   Breast cancer Mother        breast   Diabetes Son        type 1   Cancer Brother        lung   Colon cancer Neg Hx    CAD Neg Hx    Colon polyps Neg Hx    Rectal cancer Neg Hx    Stomach cancer Neg Hx     Social History   Tobacco Use   Smoking status: Former    Packs/day: 1.50    Years: 40.00    Pack years: 60.00    Types: Cigarettes    Start date: 63    Quit date: 03/19/2004    Years since quitting: 17.1   Smokeless tobacco: Never   Tobacco comments:    Started smoking at age 24  Vaping Use   Vaping Use: Never used  Substance Use Topics   Alcohol use: Yes    Alcohol/week: 3.0 - 4.0 standard drinks    Types: 3 - 4 Glasses of wine per week    Comment: socially   Drug use: No    Home Medications Prior to Admission medications   Medication Sig Start Date End Date Taking? Authorizing Provider  acetaminophen (TYLENOL) 325 MG tablet Take 2 tablets (650 mg total) by mouth every 6 (six) hours as needed for mild pain (or Fever >/= 101). 04/29/19   Norm Parcel, PA-C  amLODipine (NORVASC) 5 MG tablet Take 1 tablet (5 mg total) by mouth daily. 03/21/21 06/19/21  Sande Rives E, PA-C   amoxicillin (AMOXIL) 500 MG tablet Take 4 tablets (2,'000mg'$ ) one hour prior to all dental visits. 08/20/19   Colon Branch, MD  aspirin EC 81 MG tablet Take 81 mg by mouth daily.    [provider]  augmented betamethasone dipropionate (DIPROLENE-AF) 0.05 % cream Apply topically 2 (two) times daily. 04/28/21   Colon Branch, MD  Calcium Carb-Cholecalciferol (CALCIUM 600 + D PO) Take 600 mg by mouth 3 (three) times a week.     [provider]  Cholecalciferol (VITAMIN D3)  5000 UNITS CAPS Take 5,000 Units by mouth daily.     [provider]  denosumab (PROLIA) 60 MG/ML SOLN injection Inject 60 mg into the skin every 6 (six) months. Administer in upper arm, thigh, or abdomen    [provider]  EPINEPHrine 0.3 mg/0.3 mL IJ SOAJ injection Inject 0.3 mg into the muscle as needed for anaphylaxis. 12/01/17   [provider]  furosemide (LASIX) 20 MG tablet DAILY AS NEEDED FOR SWELLING 09/02/20   [name], MD  hydrochlorothiazide (MICROZIDE) 12.5 MG capsule Take 1 capsule (12.5 mg total) by mouth daily. 03/21/21 06/19/21  Sande Rives E, PA-C  loratadine (CLARITIN) 10 MG tablet Take 10 mg by mouth daily as needed for allergies.     [provider]  magnesium oxide (MAG-OX) 400 MG tablet TAKE 1 TABLET (400 MG TOTAL) BY MOUTH DAILY. PT OVERDUE FOR [place] OV PLEASE CALL FOR APPT 10/03/20   [name], MD  metoprolol succinate (TOPROL-XL) 25 MG 24 hr tablet TAKE 1 TABLET BY MOUTH EVERY DAY 03/28/21   [name], MD  Multiple Vitamin (MULTIVITAMIN WITH MINERALS) TABS tablet Take 1 tablet by mouth daily.    [provider]  rosuvastatin (CRESTOR) 40 MG tablet TAKE 1 TABLET BY MOUTH DAILY 10/03/20   [name], MD  umeclidinium bromide (INCRUSE ELLIPTA) 62.5 MCG/INH AEPB Inhale 1 puff into the lungs daily. 01/23/21   Colon Branch, MD  VENTOLIN HFA 108 [phone] Base) MCG/ACT inhaler TAKE 2 PUFFS BY MOUTH EVERY 6 HOURS AS NEEDED FOR WHEEZE  OR SHORTNESS OF BREATH 10/19/19   Colon Branch, MD    Allergies    Patient has no known allergies.  Review of Systems   Review of Systems  Constitutional: Negative.   HENT:  Positive for facial swelling and sore throat. Negative for congestion, dental problem, ear discharge, mouth sores, nosebleeds, postnasal drip, rhinorrhea, sinus pressure, sinus pain, sneezing, trouble swallowing and voice change.   Respiratory: Negative.    Cardiovascular: Negative.   Gastrointestinal: Negative.   Genitourinary: Negative.   Musculoskeletal:  Positive for neck pain. Negative for arthralgias, back pain, gait problem, joint swelling, myalgias and neck stiffness.  Skin: Negative.   Neurological: Negative.   All other systems reviewed and are negative.  Physical Exam Updated Vital Signs BP 130/72 (BP Location: Left Arm)   Pulse 82   Temp 98.4 F (36.9 C) (Oral)   Resp (!) 22   Ht '5\' 6"'$  (1.676 m)   Wt 74.8 kg   SpO2 93%   BMI 26.63 kg/m   Physical Exam Vitals and nursing note reviewed.  Constitutional:      General: She is not in acute distress.    Appearance: She is well-developed. She is not ill-appearing, toxic-appearing or diaphoretic.  HENT:     Head: Normocephalic and atraumatic.     Jaw: There is normal jaw occlusion.      Comments: No drooling, dysphagia or trismus.  Moderate left jaw, preauricular, parotid area swelling and some minimal overlying redness.    Mouth/Throat:     Lips: Pink.     Mouth: Mucous membranes are moist.     Pharynx: Uvula midline.     Comments: Mild posterior oropharyngeal erythema.  Uvula midline.  Unable to visualize tonsils bilaterally, sublingual area soft.  No pooling of secretions Eyes:     Pupils: Pupils are equal, round, and reactive to light.  Neck:     Trachea: Trachea and phonation normal.   Cardiovascular:  Rate and Rhythm: Normal rate.  Pulmonary:     Effort: No respiratory distress.  Abdominal:     General: There is no distension.   Musculoskeletal:        General: Normal range of motion.     Cervical back: Full passive range of motion without pain and normal range of motion.  Skin:    General: Skin is warm and dry.  Neurological:     General: No focal deficit present.     Mental Status: She is alert.  Psychiatric:        Mood and Affect: Mood normal.   ED Results / Procedures / Treatments   Labs (all labs ordered are listed, but only abnormal results are displayed) Labs Reviewed  CBC WITH DIFFERENTIAL/PLATELET  BASIC METABOLIC PANEL   EKG None  Radiology No results found.  Procedures Procedures   Medications Ordered in ED Medications - No data to display  ED Course  I have reviewed the triage vital signs and the nursing notes.  Pertinent labs & imaging results that were available during my care of the patient were reviewed by me and considered in my medical decision making (see chart for details).  80 year old here for evaluation of left-sided facial swelling which began 3 days ago.  She is afebrile, nonseptic, not ill-appearing.  She has swelling to her left jaw, preauricular and parotid area.  No respiratory distress, no stridor.  She is tolerating p.o. intake without difficulty.  Unable to visualize tonsils bilaterally.  Her sublingual area is soft.  She has no neck stiffness or neck rigidity.  No lateral neck pain, HA or neuro deficits to suggest dissection.  No prior history of parotid stones or infection.  Patient needs labs, CT scan.  Unfortunately CT scanner not available at [facility].  Obtained IV access, labs.  Discussed imaging with patient.  She prefers to go POV to [facility] Emergency department.  I feel this is reasonable, she has intact airway, does not appear septic, no respiratory distress or evidence of emerging respiratory compromise.  Benefit outweighs risk of POV transfer for imaging  Patient transferred in stable condition  Dr. Francia Greaves at [facility] excepting patient in  transfer.      MDM Rules/Calculators/A&P                            Final Clinical Impression(s) / ED Diagnoses Final diagnoses:  Facial swelling    Rx / DC Orders ED Discharge Orders     None        Parish Augustine A, PA-C 05/20/21 1157    Arnaldo Natal, MD 05/20/21 [phone]

## 2021-05-20 NOTE — ED Notes (Signed)
Patient has swelling to left Jaw started overnight on Wednesday night per patient, pain 6/10

## 2021-05-20 NOTE — ED Notes (Signed)
Pt discharged from this ED in stable condition at this time. All discharge instructions and follow up care reviewed with pt with no further questions at this time. Pt ambulatory with steady gait, clear speech.  

## 2021-05-24 ENCOUNTER — Other Ambulatory Visit: Payer: Self-pay

## 2021-05-24 ENCOUNTER — Encounter: Payer: Self-pay | Admitting: Internal Medicine

## 2021-05-24 ENCOUNTER — Ambulatory Visit (INDEPENDENT_AMBULATORY_CARE_PROVIDER_SITE_OTHER): Payer: Medicare Other | Admitting: Internal Medicine

## 2021-05-24 VITALS — BP 144/70 | HR 79 | Temp 98.3°F | Resp 18 | Ht 66.0 in | Wt 164.5 lb

## 2021-05-24 DIAGNOSIS — R918 Other nonspecific abnormal finding of lung field: Secondary | ICD-10-CM

## 2021-05-24 DIAGNOSIS — K1120 Sialoadenitis, unspecified: Secondary | ICD-10-CM

## 2021-05-24 DIAGNOSIS — I6523 Occlusion and stenosis of bilateral carotid arteries: Secondary | ICD-10-CM

## 2021-05-24 NOTE — Patient Instructions (Signed)
Continue with antibiotics  See the ENT doctor

## 2021-05-24 NOTE — Progress Notes (Signed)
Subjective:    Patient ID: Dominique [name], female    DOB: Mar 04, 1941, 80 y.o.   MRN: [id number]  DOS:  05/24/2021 Type of visit - description: Acute  Went to the ER 05/20/2021. She developed left parotid swelling and pain. She denies any fever or chills. No dental pain, no URI type of symptoms, no sore throat. At the ER: BMP okay, WBC is 11, respiratory panel negative, CT neck: Acute left parotitis.  She was prescribed antibiotics and is here for follow-up. Swelling and pain has definitely decreased.  Review of Systems See above   Past Medical History:  Diagnosis Date   Atrial fibrillation ([place])    a. dx 03/2019 while in hospital with cholecystitis   COPD (chronic obstructive pulmonary disease) ([place])    Critical lower limb ischemia (HCC)    a. after TAVR developed ischemic L leg likely due to flap/closure, s/p emergent repair of left femoral artery with endarterectomy and Dacron patch angioplasty.   Emphysema lung (HCC)    Hyperlipidemia    LDL goal < 70   Hypertension    Hypomagnesemia    Normal coronary arteries 10/2018   NSVT (nonsustained ventricular tachycardia) ([place]) 03/02/2019   Pulmonary HTN ([place]) 06/05/2016   Moderate with PASP 26mHg by echo 04/2016 likely Group 3 from COPD and possibly Group 2 from pulmonary venous HTN associated with moderate AS - f/u echo 12/2018 post TAVR showed normal RVSP   Pulmonary nodules    seen on pre TAVR CT, needs 12 month CT follow up   S/P TAVR (transcatheter aortic valve replacement) 12/30/2018   Medtronic Evolut Pro-Plus THV (size 26 mm, serial # [id number] via the TF approach   Sacral fracture (HCC)    Severe aortic stenosis    a. s/p TAVR 12/2018.   Thoracic ascending aortic aneurysm ([place]    needs yearly follow up    Past Surgical History:  Procedure Laterality Date   [place] 04/28/2019   Procedure: LAPAROSCOPIC CHOLECYSTECTOMY;  Surgeon:  BStark Klein MD;  [facility];  Service: General;  Laterality: N/A;   COLONOSCOPY  2018   FEMORAL-POPLITEAL BYPASS GRAFT Left 12/31/2018   Procedure: PATCH ANGIOPLASTY REPAIR LEFT FEMORAL ARTERY USING H[place]  Surgeon: ERosetta Posner MD;  [facility]  Service: Vascular;  Laterality: Left;   IR RADIOLOGY PERIPHERAL GUIDED IV START  05/30/2017   IR RADIOLOGY PERIPHERAL GUIDED IV START  09/02/2018   IR RADIOLOGY PERIPHERAL GUIDED IV START  09/02/2018   IR [place] VASC ACCESS LEFT  09/02/2018   IR [place] VASC ACCESS RIGHT  05/30/2017   IR [place] VASC ACCESS RIGHT  09/02/2018   JOINT REPLACEMENT  2016   sacroplasty   LAMINECTOMY     RIGHT/LEFT HEART CATH AND CORONARY ANGIOGRAPHY N/A 11/27/2018   Procedure: RIGHT/LEFT HEART CATH AND CORONARY ANGIOGRAPHY;  Surgeon: CSherren Mocha MD;  [facility];  Service: Cardiovascular;  Laterality: N/A;   TEE WITHOUT CARDIOVERSION N/A 12/30/2018   Procedure: TRANSESOPHAGEAL ECHOCARDIOGRAM (TEE);  Surgeon: CSherren Mocha MD;  [facility];  Service: Open Heart Surgery;  Laterality: N/A;   TOTAL HIP ARTHROPLASTY Bilateral    TRANSCATHETER AORTIC VALVE REPLACEMENT, TRANSFEMORAL N/A 12/30/2018   Procedure: TRANSCATHETER AORTIC VALVE REPLACEMENT, TRANSFEMORAL;  Surgeon: CSherren Mocha MD;  [facility];  Service: Open Heart  Surgery;  Laterality: N/A;   TUBAL LIGATION  1975    Allergies as of 05/24/2021   No Known Allergies      Medication List        Accurate as of May 24, 2021 11:59 PM. If you have any questions, ask your nurse or doctor.          acetaminophen 325 MG tablet Commonly known as: TYLENOL Take 2 tablets (650 mg total) by mouth every 6 (six) hours as needed for mild pain (or Fever >/= 101).   amLODipine 5 MG tablet Commonly known as: NORVASC Take 1 tablet (5 mg total) by mouth daily.   amoxicillin 500 MG tablet Commonly known as: AMOXIL Take 4  tablets (2,'000mg'$ ) one hour prior to all dental visits.   amoxicillin-clavulanate 875-125 MG tablet Commonly known as: AUGMENTIN Take 1 tablet by mouth every 12 (twelve) hours for 10 days.   aspirin EC 81 MG tablet Take 81 mg by mouth daily.   augmented betamethasone dipropionate 0.05 % cream Commonly known as: DIPROLENE-AF Apply topically 2 (two) times daily.   CALCIUM 600 + D PO Take 600 mg by mouth 3 (three) times a week.   denosumab 60 MG/ML Soln injection Commonly known as: PROLIA Inject 60 mg into the skin every 6 (six) months. Administer in upper arm, thigh, or abdomen   EPINEPHrine 0.3 mg/0.3 mL Soaj injection Commonly known as: EPI-PEN Inject 0.3 mg into the muscle as needed for anaphylaxis.   furosemide 20 MG tablet Commonly known as: LASIX DAILY AS NEEDED FOR SWELLING   hydrochlorothiazide 12.5 MG capsule Commonly known as: MICROZIDE Take 1 capsule (12.5 mg total) by mouth daily.   Incruse Ellipta 62.5 MCG/INH Aepb Generic drug: umeclidinium bromide Inhale 1 puff into the lungs daily.   loratadine 10 MG tablet Commonly known as: CLARITIN Take 10 mg by mouth daily as needed for allergies.   magnesium oxide 400 MG tablet Commonly known as: MAG-OX TAKE 1 TABLET (400 MG TOTAL) BY MOUTH DAILY. PT OVERDUE FOR [place] OV PLEASE CALL FOR APPT   metoprolol succinate 25 MG 24 hr tablet Commonly known as: TOPROL-XL TAKE 1 TABLET BY MOUTH EVERY DAY   multivitamin with minerals Tabs tablet Take 1 tablet by mouth daily.   rosuvastatin 40 MG tablet Commonly known as: CRESTOR TAKE 1 TABLET BY MOUTH DAILY   Ventolin HFA 108 (90 Base) MCG/ACT inhaler Generic drug: albuterol TAKE 2 PUFFS BY MOUTH EVERY 6 HOURS AS NEEDED FOR WHEEZE OR SHORTNESS OF BREATH   Vitamin D3 125 MCG (5000 UT) Caps Take 5,000 Units by mouth daily.           Objective:   Physical Exam BP (!) 144/70 (BP Location: Left Arm, Patient Position: Sitting, Cuff Size: Small)   Pulse 79    Temp 98.3 F (36.8 C) (Oral)   Resp 18   Ht '5\' 6"'$  (1.676 m)   Wt 164 lb 8 oz (74.6 kg)   SpO2 95%   BMI 26.55 kg/m  General:   Well developed, NAD, BMI noted. HEENT:  Normocephalic . Face symmetric, atraumatic. Right parotid gland normal, L parotid gland: Slightly swollen, mildly TTP, no fluctuance.   Neck: No lymphadenopathies Lungs:  Decreased breath sounds Normal respiratory effort, no intercostal retractions, no accessory muscle use. Heart: RRR, soft systolic murmur.  Lower extremities: no pretibial edema bilaterally  Skin: Not pale. Not jaundice Neurologic:  alert & oriented X3.  Speech normal, gait appropriate for age and unassisted Psych--  Cognition and judgment appear intact.  Cooperative with normal attention span and concentration.  Behavior appropriate. No anxious or depressed appearing.      Assessment      Assessment HTN Hyperlipidemia PULM: -Emphysema, quit tobacco 2006 -CT chest angio  05-2016 ----pulm nodule x2,reticular infiltrate ; f/u CT 11/2016 stable nodule, no f/u ----enlarged R P.A. and Ao -Abnormal PET scan 12-2019 CV: -DOE: (-) myoview  8-017, CT angio chest 05-2016:  no PE  -S/p TVAR 12/2018 -Paroxysmal A. fib DX 6/28/ 2020 in the context of acute cholecystitis, converted to NSR, not anticoagulated as of 05/25/2019 --Nonobstructive CAD (per coronary CTA) -Aspirin indefinitely, SBE prophylaxis with amoxicillin -MSK --DJD --Sacral Fx, sacral insufficiency, s/p sacroplasty (radiology) 07-2015 --H/o osteoporosis :  took fosamax while in [place], Fosamax rx again by ortho after sacral Fx 07-2015, never took it, first Prolia 11-14-2015 --T score 08-2015 >> normal (likely normal d/t DJD?) H/o vit D def: Normal level 09-2015   PLAN  Parotitis: Improving with antibiotics, has an appointment to see ENT, recommend to keep it. Pulmonary nodules: Saw pulmonary 05/08/2021, they rec to repeat a PET scan, they are considering bronchoscopy as well.  DDx  include indolent adenocarcinoma. Patient is obviously concerned, she knows potential risks of bronchoscopy. She asked me for my advice, she knows B.I.D. this is not my specialty but I can tell pulmonary has a solid plan, she is in good hands.  Her family is thinking about a second opinion and that is certainly an option. Also wonders if she should proceed with a PET scan since she has parotitis, I communicated with pulmonary and it is okay to proceed.  Patient aware.  Time spent 30 minutes reviewing ER work-up, answering multiple questions regards parotitis, pulmonary nodules, etc.  This visit occurred during the SARS-CoV-2 public health emergency.  Safety protocols were in place, including screening questions prior to the visit, additional usage of staff PPE, and extensive cleaning of exam room while observing appropriate contact time as indicated for disinfecting solutions.

## 2021-05-25 ENCOUNTER — Ambulatory Visit: Payer: Medicare Other | Admitting: Pulmonary Disease

## 2021-05-25 ENCOUNTER — Telehealth: Payer: Self-pay

## 2021-05-25 DIAGNOSIS — K1120 Sialoadenitis, unspecified: Secondary | ICD-10-CM | POA: Diagnosis not present

## 2021-05-25 NOTE — Telephone Encounter (Signed)
Spoke w/ Pt- informed PCP discussed w/ pulmonary- okay to proceed w/ PET scan tomorrow, 7/29.

## 2021-05-25 NOTE — Assessment & Plan Note (Signed)
Parotitis: Improving with antibiotics, has an appointment to see ENT, recommend to keep it. Pulmonary nodules: Saw pulmonary 05/08/2021, they rec to repeat a PET scan, they are considering bronchoscopy as well.  DDx include indolent adenocarcinoma. Patient is obviously concerned, she knows potential risks of bronchoscopy. She asked me for my advice, she knows B.I.D. this is not my specialty but I can tell pulmonary has a solid plan, she is in good hands.  Her family is thinking about a second opinion and that is certainly an option. Also wonders if she should proceed with a PET scan since she has parotitis, I communicated with pulmonary and it is okay to proceed.  Patient aware.

## 2021-05-26 ENCOUNTER — Ambulatory Visit (HOSPITAL_COMMUNITY)
Admission: RE | Admit: 2021-05-26 | Discharge: 2021-05-26 | Disposition: A | Discharge status: Home / Self Care | Payer: Medicare Other | Source: Ambulatory Visit | Attending: Pulmonary Disease | Admitting: Pulmonary Disease

## 2021-05-26 ENCOUNTER — Other Ambulatory Visit: Payer: Self-pay

## 2021-05-26 DIAGNOSIS — R918 Other nonspecific abnormal finding of lung field: Secondary | ICD-10-CM | POA: Diagnosis not present

## 2021-05-26 DIAGNOSIS — J449 Chronic obstructive pulmonary disease, unspecified: Secondary | ICD-10-CM | POA: Insufficient documentation

## 2021-05-26 DIAGNOSIS — R942 Abnormal results of pulmonary function studies: Secondary | ICD-10-CM | POA: Diagnosis not present

## 2021-05-26 LAB — GLUCOSE, CAPILLARY: Glucose-Capillary: 116 mg/dL — ABNORMAL HIGH (ref 70–99)

## 2021-05-26 MED ORDER — FLUDEOXYGLUCOSE F - 18 (FDG) INJECTION
8.2000 | Freq: Once | INTRAVENOUS | Status: AC
  Administered 2021-05-26: 8.2 via INTRAVENOUS

## 2021-05-31 ENCOUNTER — Telehealth: Payer: Self-pay | Admitting: Pulmonary Disease

## 2021-05-31 DIAGNOSIS — R918 Other nonspecific abnormal finding of lung field: Secondary | ICD-10-CM

## 2021-05-31 NOTE — Telephone Encounter (Signed)
I have called and spoke with pt and she is aware of results.

## 2021-05-31 NOTE — Telephone Encounter (Signed)
Called and spoke with Patient. Patient stated she had her PET scan completed 05/28/21, and would like her results.  Message routed to Dr. Valeta Harms to advise

## 2021-06-01 NOTE — Telephone Encounter (Signed)
CT chest w/o contrast order placed for 1 year.

## 2021-06-01 NOTE — Addendum Note (Signed)
Addended by: Elton Sin on: 06/01/2021 09:22 AM   Modules accepted: Orders

## 2021-06-02 ENCOUNTER — Other Ambulatory Visit: Payer: Self-pay | Admitting: Internal Medicine

## 2021-06-02 MED ORDER — AMLODIPINE BESYLATE 5 MG PO TABS: 5.0000 mg | ORAL_TABLET | Freq: Every day | ORAL | 3 refills | Status: DC

## 2021-06-16 ENCOUNTER — Ambulatory Visit (INDEPENDENT_AMBULATORY_CARE_PROVIDER_SITE_OTHER): Payer: Medicare Other | Admitting: Pulmonary Disease

## 2021-06-16 ENCOUNTER — Other Ambulatory Visit: Payer: Self-pay

## 2021-06-16 ENCOUNTER — Encounter: Payer: Self-pay | Admitting: Pulmonary Disease

## 2021-06-16 VITALS — BP 118/70 | HR 66 | Ht 66.0 in | Wt 165.0 lb

## 2021-06-16 DIAGNOSIS — Z87891 Personal history of nicotine dependence: Secondary | ICD-10-CM | POA: Diagnosis not present

## 2021-06-16 DIAGNOSIS — R942 Abnormal results of pulmonary function studies: Secondary | ICD-10-CM | POA: Diagnosis not present

## 2021-06-16 DIAGNOSIS — J449 Chronic obstructive pulmonary disease, unspecified: Secondary | ICD-10-CM

## 2021-06-16 DIAGNOSIS — R918 Other nonspecific abnormal finding of lung field: Secondary | ICD-10-CM

## 2021-06-16 NOTE — Progress Notes (Signed)
Synopsis: Referred in April 2021 for abnormal CT by Colon Branch, MD  Subjective:   PATIENT ID: Dominique [name] GENDER: female DOB: May 17, 1941, MRN: [id number]  Chief Complaint  Patient presents with   Follow-up    F/U after PET scan. States her breathing has been stable since last visit.     This is a 80 year old female with a past medical history of COPD, atrial fibrillation, TAVR placement by Dr. [name] last year, hypertension moderate pulmonary hypertension.  Patient had CT imaging of the chest which revealed multiple bilateral lung nodules.  Some of which had groundglass areas others had in the left upper lobe adjacent to the pleura more solid component.  Patient underwent PET scan imaging which revealed left upper lobe nodule with an SUV of 10.  Other groundglass areas had low-level PET uptake.  There was some concern for potential infectious etiology.  Therefore patient was referred to pulmonary for recommendations and management.  At this time patient has no significant complaints.  She used to be a former smoker quit many years ago.  60-pack-year history quit in 2005.  OV 05/08/2021: Here today for follow-up after recent CT imaging.  We have been following her lung nodules for some time.  She had a left upper lobe subpleural nodule along the medial surface with an SUV of 10 with multiple other areas of surrounding groundglass and subsolid nodules.  Patient's most recent CT imaging was in July 2022.  This was a 45-monthfollow-up from her December 2021 imaging.  This still demonstrates a mixed subsolid groundglass nodular lesion within the posterior left upper lobe that tends the major fissure this is stable in size.  Additionally there is a small unchanged right pulmonary apex groundglass opacity.  There is a more medial left upper lobe subpleural nodule that was not really discussed in this July 2022 follow-up but was of concern and a PET scan that date back to March 2021 which had an SUV of  10.  At the time was felt to be infectious but on subsequent follow-up imaging reveals that this nodule remains persistent.  From respiratory standpoint she is doing well.  Unfortunately anxious about everything that is going on.  OV 06/16/2021: Here today for follow-up after recent PET scan imaging.  We have been following her for multiple pulmonary nodules bilateral.  Reviewed PET scan results that has low-level uptake no significant changes in the nodules.  Overall she has no respiratory complaints at this time.     Past Medical History:  Diagnosis Date   Atrial fibrillation ([facility]    a. dx 03/2019 while in hospital with cholecystitis   COPD (chronic obstructive pulmonary disease) ([facility]    Critical lower limb ischemia (HCC)    a. after TAVR developed ischemic L leg likely due to flap/closure, s/p emergent repair of left femoral artery with endarterectomy and Dacron patch angioplasty.   Emphysema lung (HCC)    Hyperlipidemia    LDL goal < 70   Hypertension    Hypomagnesemia    Normal coronary arteries 10/2018   NSVT (nonsustained ventricular tachycardia) ([facility] 03/02/2019   Pulmonary HTN ([facility] 06/05/2016   Moderate with PASP 477mg by echo 04/2016 likely Group 3 from COPD and possibly Group 2 from pulmonary venous HTN associated with moderate AS - f/u echo 12/2018 post TAVR showed normal RVSP   Pulmonary nodules    seen on pre TAVR CT, needs 12 month CT follow up   S/P TAVR (transcatheter aortic valve  replacement) 12/30/2018   Medtronic Evolut Pro-Plus THV (size 26 mm, serial # [id number]) via the TF approach   Sacral fracture (HCC)    Severe aortic stenosis    a. s/p TAVR 12/2018.   Thoracic ascending aortic aneurysm ([place])    needs yearly follow up     Family History  Problem Relation Age of Onset   Breast cancer Mother        breast   Diabetes Son        type 1   Cancer Brother        lung   Colon cancer Neg Hx    CAD Neg Hx    Colon polyps Neg Hx    Rectal cancer Neg Hx     Stomach cancer Neg Hx      Past Surgical History:  Procedure Laterality Date   APPENDECTOMY  1951   CATARACT EXTRACTION     CESAREAN SECTION     1971   CHOLECYSTECTOMY N/A 04/28/2019   Procedure: LAPAROSCOPIC CHOLECYSTECTOMY;  Surgeon: Stark Klein, MD;  Location: [facility];  Service: General;  Laterality: N/A;   COLONOSCOPY  2018   FEMORAL-POPLITEAL BYPASS GRAFT Left 12/31/2018   Procedure: PATCH ANGIOPLASTY REPAIR LEFT FEMORAL ARTERY USING [place];  Surgeon: Rosetta Posner, MD;  Location: [facility];  Service: Vascular;  Laterality: Left;   IR RADIOLOGY PERIPHERAL GUIDED IV START  05/30/2017   IR RADIOLOGY PERIPHERAL GUIDED IV START  09/02/2018   IR RADIOLOGY PERIPHERAL GUIDED IV START  09/02/2018   IR US GUIDE VASC ACCESS LEFT  09/02/2018   IR US GUIDE VASC ACCESS RIGHT  05/30/2017   IR US GUIDE [place] RIGHT  09/02/2018   JOINT REPLACEMENT  2016   sacroplasty   LAMINECTOMY     RIGHT/LEFT HEART CATH AND CORONARY ANGIOGRAPHY N/A 11/27/2018   Procedure: RIGHT/LEFT HEART CATH AND CORONARY ANGIOGRAPHY;  Surgeon: Sherren Mocha, MD;  Location: [facility];  Service: Cardiovascular;  Laterality: N/A;   TEE WITHOUT CARDIOVERSION N/A 12/30/2018   Procedure: TRANSESOPHAGEAL ECHOCARDIOGRAM (TEE);  Surgeon: Sherren Mocha, MD;  Location: [facility];  Service: Open Heart Surgery;  Laterality: N/A;   TOTAL HIP ARTHROPLASTY Bilateral    TRANSCATHETER AORTIC VALVE REPLACEMENT, TRANSFEMORAL N/A 12/30/2018   Procedure: TRANSCATHETER AORTIC VALVE REPLACEMENT, TRANSFEMORAL;  Surgeon: Sherren Mocha, MD;  Location: [facility];  Service: Open Heart Surgery;  Laterality: N/A;   TUBAL LIGATION  1975    Social History   Socioeconomic History   Marital status: Widowed    Spouse name: Not on file   Number of children: 2   Years of education: Not on file   Highest education level: Not on file  Occupational History   Occupation: n/a  Tobacco Use   Smoking status:  Former    Packs/day: 1.50    Years: 40.00    Pack years: 60.00    Types: Cigarettes    Start date: 55    Quit date: 03/19/2004    Years since quitting: 17.2   Smokeless tobacco: Never   Tobacco comments:    Started smoking at age 52  Vaping Use   Vaping Use: Never used  Substance and Sexual Activity   Alcohol use: Yes    Alcohol/week: 3.0 - 4.0 standard drinks    Types: 3 - 4 Glasses of wine per week    Comment: socially   Drug use: No   Sexual activity: Not on file  Other  Topics Concern   Not on file  Social History Narrative   From [place]   Lost a son to DM   1 living son   Social Determinants of Health   Financial Resource Strain: Low Risk    Difficulty of Paying Living Expenses: Not very hard  Food Insecurity: No Food Insecurity   Worried About Charity fundraiser in the Last Year: Never true   Arboriculturist in the Last Year: Never true  Transportation Needs: No Transportation Needs   Lack of Transportation (Medical): No   Lack of Transportation (Non-Medical): No  Physical Activity: Insufficiently Active   Days of Exercise per Week: 7 days   Minutes of Exercise per Session: 20 min  Stress: Not on file  Social Connections: Not on file  Intimate Partner Violence: Not on file     No Known Allergies   Outpatient Medications Prior to Visit  Medication Sig Dispense Refill   acetaminophen (TYLENOL) 325 MG tablet Take 2 tablets (650 mg total) by mouth every 6 (six) hours as needed for mild pain (or Fever >/= 101).     amLODipine (NORVASC) 5 MG tablet Take 1 tablet (5 mg total) by mouth daily. 180 tablet 3   amoxicillin (AMOXIL) 500 MG tablet Take 4 tablets (2,'000mg'$ ) one hour prior to all dental visits. 28 tablet 0   aspirin EC 81 MG tablet Take 81 mg by mouth daily.     augmented betamethasone dipropionate (DIPROLENE-AF) 0.05 % cream Apply topically 2 (two) times daily. 60 g 0   Calcium Carb-Cholecalciferol (CALCIUM 600 + D PO) Take 600 mg by mouth 3 (three)  times a week.      Cholecalciferol (VITAMIN D3) 5000 UNITS CAPS Take 5,000 Units by mouth daily.      denosumab (PROLIA) 60 MG/ML SOLN injection Inject 60 mg into the skin every 6 (six) months. Administer in upper arm, thigh, or abdomen     EPINEPHrine 0.3 mg/0.3 mL IJ SOAJ injection Inject 0.3 mg into the muscle as needed for anaphylaxis.  12   furosemide (LASIX) 20 MG tablet DAILY AS NEEDED FOR SWELLING 30 tablet 3   hydrochlorothiazide (MICROZIDE) 12.5 MG capsule Take 1 capsule (12.5 mg total) by mouth daily. 90 capsule 3   loratadine (CLARITIN) 10 MG tablet Take 10 mg by mouth daily as needed for allergies.      magnesium oxide (MAG-OX) 400 MG tablet TAKE 1 TABLET (400 MG TOTAL) BY MOUTH DAILY. PT OVERDUE FOR [place] OV PLEASE CALL FOR APPT 90 tablet 3   metoprolol succinate (TOPROL-XL) 25 MG 24 hr tablet TAKE 1 TABLET BY MOUTH EVERY DAY 90 tablet 2   Multiple Vitamin (MULTIVITAMIN WITH MINERALS) TABS tablet Take 1 tablet by mouth daily.     rosuvastatin (CRESTOR) 40 MG tablet TAKE 1 TABLET BY MOUTH DAILY 90 tablet 3   umeclidinium bromide (INCRUSE ELLIPTA) 62.5 MCG/INH AEPB Inhale 1 puff into the lungs daily. 30 each 5   VENTOLIN HFA 108 (90 Base) MCG/ACT inhaler TAKE 2 PUFFS BY MOUTH EVERY 6 HOURS AS NEEDED FOR WHEEZE OR SHORTNESS OF BREATH 18 g 1   No facility-administered medications prior to visit.    Review of Systems  Constitutional:  Negative for chills, fever, malaise/fatigue and weight loss.  HENT:  Negative for hearing loss, sore throat and tinnitus.   Eyes:  Negative for blurred vision and double vision.  Respiratory:  Negative for cough, hemoptysis, sputum production, shortness of breath, wheezing and stridor.  Cardiovascular:  Negative for chest pain, palpitations, orthopnea, leg swelling and PND.  Gastrointestinal:  Negative for abdominal pain, constipation, diarrhea, heartburn, nausea and vomiting.  Genitourinary:  Negative for dysuria, hematuria and urgency.   Musculoskeletal:  Negative for joint pain and myalgias.  Skin:  Negative for itching and rash.  Neurological:  Negative for dizziness, tingling, weakness and headaches.  Endo/Heme/Allergies:  Negative for environmental allergies. Does not bruise/bleed easily.  Psychiatric/Behavioral:  Negative for depression. The patient is not nervous/anxious and does not have insomnia.   All other systems reviewed and are negative.   Objective:  Physical Exam Vitals reviewed.  Constitutional:      General: She is not in acute distress.    Appearance: She is well-developed.  HENT:     Head: Normocephalic and atraumatic.  Eyes:     General: No scleral icterus.    Conjunctiva/sclera: Conjunctivae normal.     Pupils: Pupils are equal, round, and reactive to light.  Neck:     Vascular: No JVD.     Trachea: No tracheal deviation.  Cardiovascular:     Rate and Rhythm: Normal rate and regular rhythm.     Heart sounds: Murmur heard.  Pulmonary:     Effort: Pulmonary effort is normal. No tachypnea, accessory muscle usage or respiratory distress.     Breath sounds: Normal breath sounds. No stridor. No wheezing, rhonchi or rales.  Abdominal:     General: Bowel sounds are normal. There is no distension.     Palpations: Abdomen is soft.     Tenderness: There is no abdominal tenderness.  Musculoskeletal:        General: No tenderness.     Cervical back: Neck supple.  Lymphadenopathy:     Cervical: No cervical adenopathy.  Skin:    General: Skin is warm and dry.     Capillary Refill: Capillary refill takes less than 2 seconds.     Findings: No rash.  Neurological:     Mental Status: She is alert and oriented to person, place, and time.  Psychiatric:        Behavior: Behavior normal.     Vitals:   06/16/21 1133  BP: 118/70  Pulse: 66  SpO2: 95%  Weight: 165 lb (74.8 kg)  Height: '5\' 6"'$  (1.676 m)   95% on RA BMI Readings from Last 3 Encounters:  06/16/21 26.63 kg/m  05/24/21 26.55 kg/m   05/20/21 26.63 kg/m   Wt Readings from Last 3 Encounters:  06/16/21 165 lb (74.8 kg)  05/24/21 164 lb 8 oz (74.6 kg)  05/20/21 165 lb (74.8 kg)     CBC    Component Value Date/Time   WBC 11.0 (H) 05/20/2021 1146   RBC 4.72 05/20/2021 1146   HGB 14.7 05/20/2021 1146   HGB 14.9 07/21/2020 1107   HCT 43.5 05/20/2021 1146   HCT 45.9 07/21/2020 1107   PLT 238 05/20/2021 1146   PLT 236 07/21/2020 1107   MCV 92.2 05/20/2021 1146   MCV 95 07/21/2020 1107   MCH 31.1 05/20/2021 1146   MCHC 33.8 05/20/2021 1146   RDW 13.0 05/20/2021 1146   RDW 12.8 07/21/2020 1107   LYMPHSABS 1.3 05/20/2021 1146   LYMPHSABS 1.4 11/13/2018 1310   MONOABS 1.0 05/20/2021 1146   EOSABS 0.2 05/20/2021 1146   EOSABS 0.5 (H) 11/13/2018 1310   BASOSABS 0.0 05/20/2021 1146   BASOSABS 0.1 11/13/2018 1310     Chest Imaging: 01/14/2020: CT chest Subsolid 2.4 cm pulmonary nodule concerning  for potential malignancy scattered groundglass nodule within the right upper lobe.  01/27/2020 nuclear medicine pet imaging abnormal PET activity within the left upper lobe does not go above mediastinal blood pool however does not exclude low-grade adenocarcinoma. Also has clustered nodularity within the left upper lobe concerning for potential inflammatory disorder.  Or infection. The patient's images have been independently reviewed by me.    05/02/2021 CT chest: Bilateral mixed solid and subsolid groundglass opacities both within the upper lobes.  The left upper lobe medial subpleural nodule is still present. In comparison to previous PET scan results of this lesion is still there since March 2021 was PET avid thn. The patient's images have been independently reviewed by me.    August 2022 PET/CT: Reviewed images today with patient.  Low-level uptake within these have solid lesions within the chest.  Reviewed images with patient. The patient's images have been independently reviewed by me.     Pulmonary Functions  Testing Results: PFT Results Latest Ref Rng & Units 12/09/2018 06/18/2016  FVC-Pre L 1.77 2.08  FVC-Predicted Pre % 62 72  FVC-Post L 2.17 2.35  FVC-Predicted Post % 77 81  Pre FEV1/FVC % % 60 53  Post FEV1/FCV % % 49 53  FEV1-Pre L 1.06 1.11  FEV1-Predicted Pre % 50 51  FEV1-Post L 1.07 1.25  DLCO uncorrected ml/min/mmHg 8.82 10.71  DLCO UNC% % 45 41  DLVA Predicted % 63 60  TLC L 5.43 4.69  TLC % Predicted % 104 90  RV % Predicted % 145 99    FeNO: none  Pathology: none  Echocardiogram: none  Heart Catheterization: none    Assessment & Plan:     ICD-10-CM   1. Multiple lung nodules  R91.8 CT Super D Chest Wo Contrast    2. Abnormal CT scan of lung  R91.8     3. Abnormal PET of left lung  R94.2     4. COPD GOLD II  J44.9     5. Former smoker  Z87.891       Assessment:    This is an 80 year old female, abnormal CT imaging and abnormal PET in the past, multiple pulmonary nodules within the lung subsolid and groundglass in nature.  We discussed her recent PET scan imaging.  There was no concerning nodules within the chest all low-level PET uptake.  All the nodules were stable from previous imaging.  The left upper lobe medial lesion was no longer PET avid.  We did discuss the fact that the other small groundglass areas could be indolent carcinoma however I think the next best step would be 1 year follow-up.  Patient is agreeable to this plan.  Plan: Repeat noncontrast CT chest in 1 year. Patient to follow-up with me after CT scan is complete. Continue Incruse plus as needed albuterol for COPD.    Current Outpatient Medications:    acetaminophen (TYLENOL) 325 MG tablet, Take 2 tablets (650 mg total) by mouth every 6 (six) hours as needed for mild pain (or Fever >/= 101)., Disp:  , Rfl:    amLODipine (NORVASC) 5 MG tablet, Take 1 tablet (5 mg total) by mouth daily., Disp: 180 tablet, Rfl: 3   amoxicillin (AMOXIL) 500 MG tablet, Take 4 tablets (2,'000mg'$ ) one hour  prior to all dental visits., Disp: 28 tablet, Rfl: 0   aspirin EC 81 MG tablet, Take 81 mg by mouth daily., Disp: , Rfl:    augmented betamethasone dipropionate (DIPROLENE-AF) 0.05 % cream, Apply topically 2 (  two) times daily., Disp: 60 g, Rfl: 0   Calcium Carb-Cholecalciferol (CALCIUM 600 + D PO), Take 600 mg by mouth 3 (three) times a week. , Disp: , Rfl:    Cholecalciferol (VITAMIN D3) 5000 UNITS CAPS, Take 5,000 Units by mouth daily. , Disp: , Rfl:    denosumab (PROLIA) 60 MG/ML SOLN injection, Inject 60 mg into the skin every 6 (six) months. Administer in upper arm, thigh, or abdomen, Disp: , Rfl:    EPINEPHrine 0.3 mg/0.3 mL IJ SOAJ injection, Inject 0.3 mg into the muscle as needed for anaphylaxis., Disp: , Rfl: 12   furosemide (LASIX) 20 MG tablet, DAILY AS NEEDED FOR SWELLING, Disp: 30 tablet, Rfl: 3   hydrochlorothiazide (MICROZIDE) 12.5 MG capsule, Take 1 capsule (12.5 mg total) by mouth daily., Disp: 90 capsule, Rfl: 3   loratadine (CLARITIN) 10 MG tablet, Take 10 mg by mouth daily as needed for allergies. , Disp: , Rfl:    magnesium oxide (MAG-OX) 400 MG tablet, TAKE 1 TABLET (400 MG TOTAL) BY MOUTH DAILY. PT OVERDUE FOR [place] OV PLEASE CALL FOR APPT, Disp: 90 tablet, Rfl: 3   metoprolol succinate (TOPROL-XL) 25 MG 24 hr tablet, TAKE 1 TABLET BY MOUTH EVERY DAY, Disp: 90 tablet, Rfl: 2   Multiple Vitamin (MULTIVITAMIN WITH MINERALS) TABS tablet, Take 1 tablet by mouth daily., Disp: , Rfl:    rosuvastatin (CRESTOR) 40 MG tablet, TAKE 1 TABLET BY MOUTH DAILY, Disp: 90 tablet, Rfl: 3   umeclidinium bromide (INCRUSE ELLIPTA) 62.5 MCG/INH AEPB, Inhale 1 puff into the lungs daily., Disp: 30 each, Rfl: 5   VENTOLIN HFA 108 (90 Base) MCG/ACT inhaler, TAKE 2 PUFFS BY MOUTH EVERY 6 HOURS AS NEEDED FOR WHEEZE OR SHORTNESS OF BREATH, Disp: 18 g, Rfl: 1   Garner Nash, DO [facility] 06/16/2021 11:46 AM

## 2021-06-16 NOTE — Patient Instructions (Signed)
Thank you for visiting Dr. Valeta Harms at [facility]. Today we recommend the following:  Orders Placed This Encounter  Procedures   CT Super D Chest Wo Contrast   Return for w/ Dr. Valeta Harms After CT Chest complete .    Please do your part to reduce the spread of COVID-19.

## 2021-07-18 DIAGNOSIS — Z01419 Encounter for gynecological examination (general) (routine) without abnormal findings: Secondary | ICD-10-CM | POA: Diagnosis not present

## 2021-07-18 DIAGNOSIS — N762 Acute vulvitis: Secondary | ICD-10-CM | POA: Diagnosis not present

## 2021-07-18 DIAGNOSIS — N3945 Continuous leakage: Secondary | ICD-10-CM | POA: Diagnosis not present

## 2021-07-18 DIAGNOSIS — Z1231 Encounter for screening mammogram for malignant neoplasm of breast: Secondary | ICD-10-CM | POA: Diagnosis not present

## 2021-07-18 DIAGNOSIS — R350 Frequency of micturition: Secondary | ICD-10-CM | POA: Diagnosis not present

## 2021-08-04 DIAGNOSIS — Z23 Encounter for immunization: Secondary | ICD-10-CM | POA: Diagnosis not present

## 2021-08-06 ENCOUNTER — Other Ambulatory Visit: Payer: Self-pay | Admitting: Internal Medicine

## 2021-08-16 DIAGNOSIS — Z23 Encounter for immunization: Secondary | ICD-10-CM | POA: Diagnosis not present

## 2021-08-16 DIAGNOSIS — L821 Other seborrheic keratosis: Secondary | ICD-10-CM | POA: Diagnosis not present

## 2021-08-16 DIAGNOSIS — L853 Xerosis cutis: Secondary | ICD-10-CM | POA: Diagnosis not present

## 2021-08-16 DIAGNOSIS — L570 Actinic keratosis: Secondary | ICD-10-CM | POA: Diagnosis not present

## 2021-08-16 DIAGNOSIS — D225 Melanocytic nevi of trunk: Secondary | ICD-10-CM | POA: Diagnosis not present

## 2021-08-16 DIAGNOSIS — L814 Other melanin hyperpigmentation: Secondary | ICD-10-CM | POA: Diagnosis not present

## 2021-08-16 DIAGNOSIS — L578 Other skin changes due to chronic exposure to nonionizing radiation: Secondary | ICD-10-CM | POA: Diagnosis not present

## 2021-08-16 DIAGNOSIS — L281 Prurigo nodularis: Secondary | ICD-10-CM | POA: Diagnosis not present

## 2021-08-17 ENCOUNTER — Ambulatory Visit (INDEPENDENT_AMBULATORY_CARE_PROVIDER_SITE_OTHER): Payer: Medicare Other

## 2021-08-17 VITALS — BP 129/67 | HR 73 | Ht 66.0 in | Wt 160.0 lb

## 2021-08-17 DIAGNOSIS — Z0000 Encounter for general adult medical examination without abnormal findings: Secondary | ICD-10-CM

## 2021-08-17 NOTE — Progress Notes (Addendum)
Subjective:   Dominique [name] is a 80 y.o. female who presents for Medicare Annual (Subsequent) preventive examination.  I connected with Charlyn today by telephone and verified that I am speaking with the correct person using two identifiers. Location patient: home Location provider: work Persons participating in the virtual visit: patient, Marine scientist.    I discussed the limitations, risks, security and privacy concerns of performing an evaluation and management service by telephone and the availability of in person appointments. I also discussed with the patient that there may be a patient responsible charge related to this service. The patient expressed understanding and verbally consented to this telephonic visit.    Interactive audio and video telecommunications were attempted between this provider and patient, however failed, due to patient having technical difficulties OR patient did not have access to video capability.  We continued and completed visit with audio only.  Some vital signs may be absent or patient reported.   Time Spent with patient on telephone encounter: 20 minutes   Review of Systems     Cardiac Risk Factors include: advanced age (>49men, >69 women);dyslipidemia;hypertension     Objective:    Today's Vitals   08/17/21 1059  BP: 129/67  Pulse: 73  Weight: 160 lb (72.6 kg)  Height: 5\' 6"  (1.676 m)   Body mass index is 25.82 kg/m.  Advanced Directives 08/17/2021 05/20/2021 06/27/2020 06/08/2019 06/02/2019 05/25/2019 04/26/2019  Does Patient Have a Medical Advance Directive? Yes Yes Yes Yes Yes Yes Yes  Type of Paramedic of [place];Living will [place];Living will [place];Living will Living will;Healthcare Power of [place];Living will [place];Living will  Does patient want to make changes to medical advance directive? - - No - Patient declined - - No -  Patient declined No - Patient declined  Copy of [place] in Chart? Yes - validated most recent copy scanned in chart (See row information) No - copy requested No - copy requested - - Yes - validated most recent copy scanned in chart (See row information) No - copy requested  Would patient like information on creating a medical advance directive? - - - - - - No - Patient declined    Current Medications (verified) Outpatient Encounter Medications as of 08/17/2021  Medication Sig   acetaminophen (TYLENOL) 325 MG tablet Take 2 tablets (650 mg total) by mouth every 6 (six) hours as needed for mild pain (or Fever >/= 101).   amLODipine (NORVASC) 5 MG tablet Take 1 tablet (5 mg total) by mouth daily.   amoxicillin (AMOXIL) 500 MG tablet Take 4 tablets (2,000mg ) one hour prior to all dental visits.   aspirin EC 81 MG tablet Take 81 mg by mouth daily.   Calcium Carb-Cholecalciferol (CALCIUM 600 + D PO) Take 600 mg by mouth 3 (three) times a week.    Cholecalciferol (VITAMIN D3) 5000 UNITS CAPS Take 5,000 Units by mouth daily.    denosumab (PROLIA) 60 MG/ML SOLN injection Inject 60 mg into the skin every 6 (six) months. Administer in upper arm, thigh, or abdomen   EPINEPHrine 0.3 mg/0.3 mL IJ SOAJ injection Inject 0.3 mg into the muscle as needed for anaphylaxis.   furosemide (LASIX) 20 MG tablet DAILY AS NEEDED FOR SWELLING   INCRUSE ELLIPTA 62.5 MCG/INH AEPB TAKE 1 PUFF BY MOUTH EVERY DAY   loratadine (CLARITIN) 10 MG tablet Take 10 mg by mouth daily as needed for  allergies.    magnesium oxide (MAG-OX) 400 MG tablet TAKE 1 TABLET (400 MG TOTAL) BY MOUTH DAILY. PT OVERDUE FOR [place] [facility] PLEASE CALL FOR APPT   metoprolol succinate (TOPROL-XL) 25 MG 24 hr tablet TAKE 1 TABLET BY MOUTH EVERY DAY   Multiple Vitamin (MULTIVITAMIN WITH MINERALS) TABS tablet Take 1 tablet by mouth daily.   rosuvastatin (CRESTOR) 40 MG tablet TAKE 1 TABLET BY MOUTH DAILY   VENTOLIN HFA 108 (90 Base)  MCG/ACT inhaler TAKE 2 PUFFS BY MOUTH EVERY 6 HOURS AS NEEDED FOR WHEEZE OR SHORTNESS OF BREATH   augmented betamethasone dipropionate (DIPROLENE-AF) 0.05 % cream Apply topically 2 (two) times daily.   hydrochlorothiazide (MICROZIDE) 12.5 MG capsule Take 1 capsule (12.5 mg total) by mouth daily.   No facility-administered encounter medications on file as of 08/17/2021.    Allergies (verified) Patient has no known allergies.   History: Past Medical History:  Diagnosis Date   Atrial fibrillation ([place])    a. dx 03/2019 while in hospital with cholecystitis   COPD (chronic obstructive pulmonary disease) ([place])    Critical lower limb ischemia (HCC)    a. after TAVR developed ischemic L leg likely due to flap/closure, s/p emergent repair of left femoral artery with endarterectomy and Dacron patch angioplasty.   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 s/p TAVR 12/2018.   Thoracic ascending aortic aneurysm    needs yearly follow up   Past Surgical History:  Procedure Laterality Date   APPENDECTOMY  1951   CATARACT EXTRACTION     [place] N/A 04/28/2019   Procedure: LAPAROSCOPIC CHOLECYSTECTOMY;  Surgeon: Stark Klein, MD;  Location: [facility];  Service: General;  Laterality: N/A;   COLONOSCOPY  2018   FEMORAL-POPLITEAL BYPASS  GRAFT Left 12/31/2018   Procedure: PATCH ANGIOPLASTY REPAIR LEFT FEMORAL ARTERY USING [place];  Surgeon: Rosetta Posner, MD;  Location: [facility];  Service: Vascular;  Laterality: Left;   IR RADIOLOGY PERIPHERAL GUIDED IV START  05/30/2017   IR RADIOLOGY PERIPHERAL GUIDED IV START  09/02/2018   IR RADIOLOGY PERIPHERAL GUIDED IV START  09/02/2018   IR US GUIDE VASC ACCESS LEFT  09/02/2018   IR US GUIDE VASC ACCESS RIGHT  05/30/2017   IR US GUIDE VASC ACCESS RIGHT  09/02/2018   JOINT REPLACEMENT  2016   sacroplasty   LAMINECTOMY     RIGHT/LEFT HEART CATH AND CORONARY ANGIOGRAPHY N/A 11/27/2018   Procedure: RIGHT/LEFT HEART CATH AND CORONARY ANGIOGRAPHY;  Surgeon: Sherren Mocha, MD;  Location: [facility];  Service: Cardiovascular;  Laterality: N/A;   TEE WITHOUT CARDIOVERSION N/A 12/30/2018   Procedure: TRANSESOPHAGEAL ECHOCARDIOGRAM (TEE);  Surgeon: [place]ren Mocha, MD;  Location: [facility];  Service: Open Heart Surgery;  Laterality: N/A;   TOTAL HIP ARTHROPLASTY Bilateral    TRANSCATHETER AORTIC VALVE REPLACEMENT, TRANSFEMORAL N/A 12/30/2018   Procedure: TRANSCATHETER AORTIC VALVE REPLACEMENT, TRANSFEMORAL;  Surgeon: [place]ren Mocha, MD;  Location: [facility];  Service: Open Heart Surgery;  Laterality: N/A;   TUBAL LIGATION  1975   Family History  Problem Relation Age of Onset   Breast cancer Mother        breast   Diabetes Son        type 1   Cancer Brother        lung   Colon cancer Neg Hx    CAD Neg Hx    Colon polyps Neg Hx    Rectal cancer Neg Hx    Stomach cancer Neg Hx    Social History   Socioeconomic History   Marital status: Widowed    Spouse name: Not on file   Number of children: 2   Years of education: Not on file   Highest education level: Not on file  Occupational History   Occupation: n/a  Tobacco Use   Smoking status: Former    Packs/day: 1.50    Years: 40.00    Pack years: 60.00    Types: Cigarettes    Start date:  60    Quit date: 03/19/2004    Years since quitting: 17.4   Smokeless tobacco: Never   Tobacco comments:    Started smoking at age 60  Vaping Use   Vaping Use: Never used  Substance and Sexual Activity   Alcohol use: Yes    Alcohol/week: 3.0 - 4.0 standard drinks    Types: 3 - 4 Glasses of wine per week    Comment: socially   Drug use: No   Sexual activity: Not on file  Other Topics Concern   Not on file  Social History Narrative   From [place]   Lost a son to DM   1 living son   Social Determinants of Health   Financial Resource Strain: Low Risk    Difficulty of Paying Living Expenses: Not very hard  Food Insecurity: No Food Insecurity   Worried About Charity fundraiser in the Last Year: Never true   Arboriculturist in the Last Year: Never true  Transportation Needs: No Transportation Needs   Lack of Transportation (Medical): No   Lack of Transportation (Non-Medical): No  Physical Activity: Insufficiently Active   Days of Exercise per Week: 7 days   Minutes of Exercise per Session: 20 min  Stress: No Stress Concern Present   Feeling of Stress : Not at all  Social Connections: Moderately Isolated   Frequency of Communication with Friends and Family: More than three times a week   Frequency of Social Gatherings with Friends and Family: More than three times a week   Attends Religious Services: Never   Marine scientist or Organizations: Yes   Attends Music therapist: More than 4 times per year   Marital Status: Widowed    Tobacco Counseling Counseling given: Not Answered Tobacco comments: Started smoking at age 25   Clinical Intake:  Pre-visit preparation completed: Yes  Pain : No/denies pain     BMI - recorded: 25.82 Nutritional Status: BMI 25 -29 Overweight Nutritional Risks: None Diabetes: No  How often do you need to have someone help you when you read instructions, pamphlets, or other written materials from your doctor or  pharmacy?: 1 - Never  Diabetic?No  Interpreter Needed?: No  Information entered by :: Caroleen Hamman LPN   Activities of Daily Living In your present state of health, do you have any difficulty performing the following activities: 08/17/2021  Hearing? N  Vision? N  Difficulty concentrating or making decisions? N  Walking or climbing stairs? N  Dressing or bathing? N  Doing errands, shopping? N  Preparing Food and eating ? N  Using the Toilet? N  In the past six months, have you accidently leaked urine? Y  Do you have problems with loss of bowel control? N  Managing your Medications? N  Managing your Finances? N  Housekeeping or managing your Housekeeping? N  Some recent data might be hidden    Patient Care Team: Colon Branch, MD as PCP - General (Internal Medicine) [name], MD as PCP - Cardiology (Cardiology) Dr Lindley [facility] as Consulting Physician [facility]) [facility] (Orthopedic Surgery) Linda Hedges, DO as Consulting Physician (Obstetrics and Gynecology) Cherre Robins, PharmD (Pharmacist)  Indicate any recent Medical Services you may have received from other than [facility] providers in the past year (date may be approximate).     Assessment:   This is a routine wellness examination for Dominique [name].  Hearing/Vision screen Hearing Screening - Comments:: No issues Vision Screening - Comments:: Last eye exam- months ago-Dr. Eulas Post  Dietary issues and exercise activities discussed: Current Exercise Habits: The patient does not participate in regular exercise at present, Exercise limited by: None identified   Goals Addressed             This Visit's Progress    DIET - INCREASE WATER INTAKE   On track    Increase physical activity   Not on track      Depression Screen [facility] 2/9 Scores 08/17/2021 04/28/2021 12/27/2020 06/27/2020 05/25/2019 11/13/2017 10/15/2016  PHQ - 2 Score 0 5 0 0 0 0 0  PHQ- 9 Score - 9 - - - - -    Fall  Risk Fall Risk  08/17/2021 04/28/2021 12/27/2020 06/27/2020 05/25/2019  Falls in the past year? 0 0 0 0 0  Comment - - - - -  Number falls in past yr: 0 0 0 0 -  Injury with Fall? 0 0 0 0 -  Follow up Falls prevention discussed Falls evaluation completed - Education provided;Falls prevention discussed -    FALL RISK PREVENTION PERTAINING TO THE HOME:  Any stairs in or around the home? No  Home free of loose throw rugs in walkways, pet beds, electrical cords, etc? No rugs Adequate lighting in your home to reduce risk of falls? Yes   ASSISTIVE DEVICES UTILIZED TO PREVENT FALLS:  Life alert? No  Use of a cane, walker or w/c? No  Grab bars in the bathroom? Yes  Shower chair or bench in shower? Yes  Elevated toilet seat or a handicapped toilet? No   TIMED UP AND GO:  Was the test performed? No . Phone visit   Cognitive Function:Normal cognitive status assessed by this Nurse Health Advisor. No abnormalities found.          Immunizations Immunization History  Administered Date(s) Administered   Fluad Quad(high Dose 65+) 07/14/2019, 08/23/2020   Influenza, High Dose Seasonal PF 10/03/2016, 08/07/2018   Influenza-Unspecified 09/06/2015, 07/18/2017, 08/03/2021   Moderna Sars-Covid-2 Vaccination 12/01/2019, 12/31/2019, 07/05/2020   Pfizer Covid-19 Vaccine Bivalent Booster 77yrs & up 08/03/2021   Pneumococcal Conjugate-13 09/27/2014   Pneumococcal Polysaccharide-23 10/29/2010,  12/27/2020   Td 04/24/2016   Zoster Recombinat (Shingrix) 10/18/2017, 03/04/2018    TDAP status: Up to date  Flu Vaccine status: Up to date  Pneumococcal vaccine status: Up to date  Covid-19 vaccine status: Completed vaccines  Qualifies for Shingles Vaccine? No   Zostavax completed No   Shingrix Completed?: Yes  Screening Tests Health Maintenance  Topic Date Due   COVID-19 Vaccine (4 - Booster for Moderna series) 08/30/2020   TETANUS/TDAP  04/24/2026   Pneumonia Vaccine 107+ Years old  Completed    INFLUENZA VACCINE  Completed   DEXA SCAN  Completed   Zoster Vaccines- Shingrix  Completed   HPV VACCINES  Aged Out    Health Maintenance  Health Maintenance Due  Topic Date Due   COVID-19 Vaccine (4 - Booster for Moderna series) 08/30/2020    Colorectal cancer screening: No longer required.   Mammogram status: Completed bilateral 06/2021 per patient at GYN office-awaiting report. Repeat every year  Bone Density status: Declined  Lung Cancer Screening: (Low Dose CT Chest recommended if Age 54-80 years, 30 pack-year currently smoking OR have quit w/in 15years.) does not qualify.     Additional Screening:  Hepatitis C Screening: does not qualify  Vision Screening: Recommended annual ophthalmology exams for early detection of glaucoma and other disorders of the eye. Is the patient up to date with their annual eye exam?  Yes  Who is the provider or what is the name of the office in which the patient attends annual eye exams? Dr. Eulas Post   Dental Screening: Recommended annual dental exams for proper oral hygiene  Community Resource Referral / Chronic Care Management: CRR required this visit?  No   CCM required this visit?  No      Plan:     I have personally reviewed and noted the following in the patient's chart:   Medical and social history Use of alcohol, tobacco or illicit drugs  Current medications and supplements including opioid prescriptions.  Functional ability and status Nutritional status Physical activity Advanced directives List of other physicians Hospitalizations, surgeries, and ER visits in previous 12 months Vitals Screenings to include cognitive, depression, and falls Referrals and appointments  In addition, I have reviewed and discussed with patient certain preventive protocols, quality metrics, and best practice recommendations. A written personalized care plan for preventive services as well as general preventive health recommendations were  provided to patient.   Due to this being a telephonic visit, the after visit summary with patients personalized plan was offered to patient via mail or my-chart. Patient would like to access on my-chart.   Marta Antu, LPN   [id number]  Nurse Health Advisor  Nurse Notes: None    I have reviewed and agree with Health Coaches documentation.  Kathlene November, MD

## 2021-08-17 NOTE — Patient Instructions (Signed)
Dominique [name] , Thank you for taking time to complete your Medicare Wellness Visit. I appreciate your ongoing commitment to your health goals. Please review the following plan we discussed and let me know if I can assist you in the future.   Screening recommendations/referrals: Colonoscopy: No longer required Mammogram: Completed 06/2021-Due 06/2022 Bone Density: Declined Recommended yearly ophthalmology/optometry visit for glaucoma screening and checkup Recommended yearly dental visit for hygiene and checkup  Vaccinations: Influenza vaccine: Up to date Pneumococcal vaccine: Up to date Tdap vaccine: Up to date-Due-04/24/2026 Shingles vaccine: Completed vaccines   Covid-19:Up to date  Advanced directives: Copy in chart  Conditions/risks identified: See problem list  Next appointment: Follow up in one year for your annual wellness visit 08/20/2022 @ 11:40   Preventive Care 81 Years and Older, Female Preventive care refers to lifestyle choices and visits with your health care provider that can promote health and wellness. What does preventive care include? A yearly physical exam. This is also called an annual well check. Dental exams once or twice a year. Routine eye exams. Ask your health care provider how often you should have your eyes checked. Personal lifestyle choices, including: Daily care of your teeth and gums. Regular physical activity. Eating a healthy diet. Avoiding tobacco and drug use. Limiting alcohol use. Practicing safe sex. Taking low-dose aspirin every day. Taking vitamin and mineral supplements as recommended by your health care provider. What happens during an annual well check? The services and screenings done by your health care provider during your annual well check will depend on your age, overall health, lifestyle risk factors, and family history of disease. Counseling  Your health care provider may ask you questions about your: Alcohol use. Tobacco  use. Drug use. Emotional well-being. Home and relationship well-being. Sexual activity. Eating habits. History of falls. Memory and ability to understand (cognition). Work and work Statistician. Reproductive health. Screening  You may have the following tests or measurements: Height, weight, and BMI. Blood pressure. Lipid and cholesterol levels. These may be checked every 5 years, or more frequently if you are over 21 years old. Skin check. Lung cancer screening. You may have this screening every year starting at age 78 if you have a 30-pack-year history of smoking and currently smoke or have quit within the past 15 years. Fecal occult blood test (FOBT) of the stool. You may have this test every year starting at age 29. Flexible sigmoidoscopy or colonoscopy. You may have a sigmoidoscopy every 5 years or a colonoscopy every 10 years starting at age 38. Hepatitis C blood test. Hepatitis B blood test. Sexually transmitted disease (STD) testing. Diabetes screening. This is done by checking your blood sugar (glucose) after you have not eaten for a while (fasting). You may have this done every 1-3 years. Bone density scan. This is done to screen for osteoporosis. You may have this done starting at age 21. Mammogram. This may be done every 1-2 years. Talk to your health care provider about how often you should have regular mammograms. Talk with your health care provider about your test results, treatment options, and if necessary, the need for more tests. Vaccines  Your health care provider may recommend certain vaccines, such as: Influenza vaccine. This is recommended every year. Tetanus, diphtheria, and acellular pertussis (Tdap, Td) vaccine. You may need a Td booster every 10 years. Zoster vaccine. You may need this after age 74. Pneumococcal 13-valent conjugate (PCV13) vaccine. One dose is recommended after age 22. Pneumococcal polysaccharide (PPSV23) vaccine. One  dose is recommended  after age 65. Talk to your health care provider about which screenings and vaccines you need and how often you need them. This information is not intended to replace advice given to you by your health care provider. Make sure you discuss any questions you have with your health care provider. Document Released: 11/11/2015 Document Revised: 07/04/2016 Document Reviewed: 08/16/2015 Elsevier Interactive Patient Education  2017 [place] Prevention in the Home Falls can cause injuries. They can happen to people of all ages. There are many things you can do to make your home safe and to help prevent falls. What can I do on the outside of my home? Regularly fix the edges of walkways and driveways and fix any cracks. Remove anything that might make you trip as you walk through a door, such as a raised step or threshold. Trim any bushes or trees on the path to your home. Use bright outdoor lighting. Clear any walking paths of anything that might make someone trip, such as rocks or tools. Regularly check to see if handrails are loose or broken. Make sure that both sides of any steps have handrails. Any raised decks and porches should have guardrails on the edges. Have any leaves, snow, or ice cleared regularly. Use sand or salt on walking paths during winter. Clean up any spills in your garage right away. This includes oil or grease spills. What can I do in the bathroom? Use night lights. Install grab bars by the toilet and in the tub and shower. Do not use towel bars as grab bars. Use non-skid mats or decals in the tub or shower. If you need to sit down in the shower, use a plastic, non-slip stool. Keep the floor dry. Clean up any water that spills on the floor as soon as it happens. Remove soap buildup in the tub or shower regularly. Attach bath mats securely with double-sided non-slip rug tape. Do not have throw rugs and other things on the floor that can make you trip. What can I do  in the bedroom? Use night lights. Make sure that you have a light by your bed that is easy to reach. Do not use any sheets or blankets that are too big for your bed. They should not hang down onto the floor. Have a firm chair that has side arms. You can use this for support while you get dressed. Do not have throw rugs and other things on the floor that can make you trip. What can I do in the kitchen? Clean up any spills right away. Avoid walking on wet floors. Keep items that you use a lot in easy-to-reach places. If you need to reach something above you, use a strong step stool that has a grab bar. Keep electrical cords out of the way. Do not use floor polish or wax that makes floors slippery. If you must use wax, use non-skid floor wax. Do not have throw rugs and other things on the floor that can make you trip. What can I do with my stairs? Do not leave any items on the stairs. Make sure that there are handrails on both sides of the stairs and use them. Fix handrails that are broken or loose. Make sure that handrails are as long as the stairways. Check any carpeting to make sure that it is firmly attached to the stairs. Fix any carpet that is loose or worn. Avoid having throw rugs at the top or bottom of the  stairs. If you do have throw rugs, attach them to the floor with carpet tape. Make sure that you have a light switch at the top of the stairs and the bottom of the stairs. If you do not have them, ask someone to add them for you. What else can I do to help prevent falls? Wear shoes that: Do not have high heels. Have rubber bottoms. Are comfortable and fit you well. Are closed at the toe. Do not wear sandals. If you use a stepladder: Make sure that it is fully opened. Do not climb a closed stepladder. Make sure that both sides of the stepladder are locked into place. Ask someone to hold it for you, if possible. Clearly mark and make sure that you can see: Any grab bars or  handrails. First and last steps. Where the edge of each step is. Use tools that help you move around (mobility aids) if they are needed. These include: Canes. Walkers. Scooters. Crutches. Turn on the lights when you go into a dark area. Replace any light bulbs as soon as they burn out. Set up your furniture so you have a clear path. Avoid moving your furniture around. If any of your floors are uneven, fix them. If there are any pets around you, be aware of where they are. Review your medicines with your doctor. Some medicines can make you feel dizzy. This can increase your chance of falling. Ask your doctor what other things that you can do to help prevent falls. This information is not intended to replace advice given to you by your health care provider. Make sure you discuss any questions you have with your health care provider. Document Released: 08/11/2009 Document Revised: 03/22/2016 Document Reviewed: 11/19/2014 Elsevier Interactive Patient Education  2017 [employer].

## 2021-08-18 DIAGNOSIS — H1045 Other chronic allergic conjunctivitis: Secondary | ICD-10-CM | POA: Diagnosis not present

## 2021-08-18 DIAGNOSIS — H524 Presbyopia: Secondary | ICD-10-CM | POA: Diagnosis not present

## 2021-08-18 DIAGNOSIS — G43009 Migraine without aura, not intractable, without status migrainosus: Secondary | ICD-10-CM | POA: Diagnosis not present

## 2021-08-18 DIAGNOSIS — H31091 Other chorioretinal scars, right eye: Secondary | ICD-10-CM | POA: Diagnosis not present

## 2021-08-18 DIAGNOSIS — H11122 Conjunctival concretions, left eye: Secondary | ICD-10-CM | POA: Diagnosis not present

## 2021-08-29 ENCOUNTER — Other Ambulatory Visit: Payer: Self-pay

## 2021-08-29 ENCOUNTER — Ambulatory Visit (INDEPENDENT_AMBULATORY_CARE_PROVIDER_SITE_OTHER): Payer: Medicare Other

## 2021-08-29 DIAGNOSIS — M810 Age-related osteoporosis without current pathological fracture: Secondary | ICD-10-CM

## 2021-08-29 MED ORDER — DENOSUMAB 60 MG/ML ~~LOC~~ SOSY
60.0000 mg | PREFILLED_SYRINGE | Freq: Once | SUBCUTANEOUS | Status: AC
  Administered 2021-08-29: 60 mg via SUBCUTANEOUS

## 2021-08-29 NOTE — Progress Notes (Signed)
Dominique [name] is a 80 y.o. female presents to the office today for Prolia injection, per physician's orders. Prolia 60mg /ml administered SQ in left arm. Patient tolerated injection.  [name]

## 2021-09-17 ENCOUNTER — Other Ambulatory Visit: Payer: Self-pay | Admitting: Cardiovascular Disease

## 2021-09-23 ENCOUNTER — Other Ambulatory Visit: Payer: Self-pay | Admitting: Cardiovascular Disease

## 2021-09-28 ENCOUNTER — Other Ambulatory Visit: Payer: Self-pay

## 2021-09-28 ENCOUNTER — Encounter: Payer: Self-pay | Admitting: Internal Medicine

## 2021-09-28 ENCOUNTER — Ambulatory Visit (INDEPENDENT_AMBULATORY_CARE_PROVIDER_SITE_OTHER): Payer: Medicare Other | Admitting: Internal Medicine

## 2021-09-28 VITALS — BP 126/70 | HR 80 | Temp 98.1°F | Resp 18 | Ht 66.0 in | Wt 163.5 lb

## 2021-09-28 DIAGNOSIS — J449 Chronic obstructive pulmonary disease, unspecified: Secondary | ICD-10-CM | POA: Diagnosis not present

## 2021-09-28 DIAGNOSIS — I6523 Occlusion and stenosis of bilateral carotid arteries: Secondary | ICD-10-CM

## 2021-09-28 DIAGNOSIS — I10 Essential (primary) hypertension: Secondary | ICD-10-CM

## 2021-09-28 DIAGNOSIS — Z23 Encounter for immunization: Secondary | ICD-10-CM

## 2021-09-28 DIAGNOSIS — R739 Hyperglycemia, unspecified: Secondary | ICD-10-CM | POA: Diagnosis not present

## 2021-09-28 DIAGNOSIS — R918 Other nonspecific abnormal finding of lung field: Secondary | ICD-10-CM

## 2021-09-28 DIAGNOSIS — E559 Vitamin D deficiency, unspecified: Secondary | ICD-10-CM

## 2021-09-28 DIAGNOSIS — E7800 Pure hypercholesterolemia, unspecified: Secondary | ICD-10-CM

## 2021-09-28 LAB — COMPREHENSIVE METABOLIC PANEL
ALT: 22 U/L (ref 0–35)
AST: 27 U/L (ref 0–37)
Albumin: 3.7 g/dL (ref 3.5–5.2)
Alkaline Phosphatase: 47 U/L (ref 39–117)
BUN: 17 mg/dL (ref 6–23)
CO2: 33 mEq/L — ABNORMAL HIGH (ref 19–32)
Calcium: 9.5 mg/dL (ref 8.4–10.5)
Chloride: 103 mEq/L (ref 96–112)
Creatinine, Ser: 0.83 mg/dL (ref 0.40–1.20)
GFR: 66.11 mL/min (ref >60.00)
Glucose, Bld: 87 mg/dL (ref 70–99)
Potassium: 3.7 mEq/L (ref 3.5–5.1)
Sodium: 142 mEq/L (ref 135–145)
Total Bilirubin: 0.6 mg/dL (ref 0.2–1.2)
Total Protein: 6.5 g/dL (ref 6.0–8.3)

## 2021-09-28 LAB — HEMOGLOBIN A1C: Hgb A1c MFr Bld: 6 % (ref 4.6–6.5)

## 2021-09-28 LAB — VITAMIN D 25 HYDROXY (VIT D DEFICIENCY, FRACTURES): VITD: 97.54 ng/mL (ref 30.00–100.00)

## 2021-09-28 LAB — LIPID PANEL
Cholesterol: 140 mg/dL (ref 0–200)
HDL: 64.9 mg/dL (ref >39.00)
LDL Cholesterol: 57 mg/dL (ref 0–99)
NonHDL: 74.79
Total CHOL/HDL Ratio: 2
Triglycerides: 87 mg/dL (ref 0.0–149.0)
VLDL: 17.4 mg/dL (ref 0.0–40.0)

## 2021-09-28 NOTE — Assessment & Plan Note (Signed)
-  Td 2017 -pnm shot 2012; prevnar 2015; pnm 23:booster today  -s/p  shingrex x2   - UTD covid vax - had a flu shot  -Female care: Last visit with gynecology 06/2021  MMG 06-2019 ([facility]) but pt reports has a MMG q year -CCS:  Had 2-3 Cscopes before, +polyps,  cscope [place] 06-2016, referred to [facility] scope done 11-09-16, bx sessile serrated polyp, next per GI ; patient does not desire another colonoscopy which is within guidelines. -ACP documents charted

## 2021-09-28 NOTE — Progress Notes (Signed)
Subjective:    Patient ID: Dominique [name], female    DOB: [birth year], 80 y.o.   MRN: [id number]  DOS:  09/28/2021 Type of visit - description: f/u  Follow-up, doing well.  Has no major concerns. Notes from pulmonary and ENT reviewed Labs reviewed. Preventive care reviewed.  Review of Systems Denies chest pain or difficulty breathing. No major problems with cough or wheezing No edema   Past Medical History:  Diagnosis Date   Atrial fibrillation ([place])    a. dx 03/2019 while in hospital with cholecystitis   COPD (chronic obstructive pulmonary disease) ([place])    Critical lower limb ischemia (HCC)    a. after TAVR developed ischemic L leg likely due to flap/closure, s/p emergent repair of left femoral artery with endarterectomy and Dacron patch angioplasty.   Emphysema lung (HCC)    Hyperlipidemia    LDL goal < 70   Hypertension    Hypomagnesemia    Normal coronary arteries 10/2018   NSVT (nonsustained ventricular tachycardia) 03/02/2019   Pulmonary HTN ([place]) 06/05/2016   Moderate with PASP 22mmHg by echo 04/2016 likely Group 3 from COPD and possibly Group 2 from pulmonary venous HTN associated with moderate AS - f/u echo 12/2018 post TAVR showed normal RVSP   Pulmonary nodules    seen on pre TAVR CT, needs 12 month CT follow up   S/P TAVR (transcatheter aortic valve replacement) 12/30/2018   Medtronic Evolut Pro-Plus THV (size 26 mm, serial # [id number]) via the TF approach   Sacral fracture (HCC)    Severe aortic stenosis    a. s/p TAVR 12/2018.   Thoracic ascending aortic aneurysm    needs yearly follow up    Past Surgical History:  Procedure Laterality Date   APPENDECTOMY  1951   CATARACT EXTRACTION     [place] N/A 04/28/2019   Procedure: LAPAROSCOPIC CHOLECYSTECTOMY;  Surgeon: Stark Klein, MD;  Location: [facility];  Service: General;  Laterality: N/A;   COLONOSCOPY  2018   FEMORAL-POPLITEAL BYPASS GRAFT Left 12/31/2018   Procedure: PATCH  ANGIOPLASTY REPAIR LEFT FEMORAL ARTERY USING [place];  Surgeon: Rosetta Posner, MD;  Location: [place];  Service: Vascular;  Laterality: Left;   IR RADIOLOGY PERIPHERAL GUIDED IV START  05/30/2017   IR RADIOLOGY PERIPHERAL GUIDED IV START  09/02/2018   IR RADIOLOGY PERIPHERAL GUIDED IV START  09/02/2018   IR US GUIDE VASC ACCESS LEFT  09/02/2018   IR US GUIDE VASC ACCESS RIGHT  05/30/2017   IR US GUIDE VASC ACCESS RIGHT  09/02/2018   JOINT REPLACEMENT  2016   sacroplasty   LAMINECTOMY     RIGHT/LEFT HEART CATH AND CORONARY ANGIOGRAPHY N/A 11/27/2018   Procedure: RIGHT/LEFT HEART CATH AND CORONARY ANGIOGRAPHY;  Surgeon: Sherren Mocha, MD;  Location: [place] [facility];  Service: Cardiovascular;  Laterality: N/A;   TEE WITHOUT CARDIOVERSION N/A 12/30/2018   Procedure: TRANSESOPHAGEAL ECHOCARDIOGRAM (TEE);  Surgeon: Sherren Mocha, MD;  Location: [place] [facility];  Service: Open Heart Surgery;  Laterality: N/A;   TOTAL HIP ARTHROPLASTY Bilateral    TRANSCATHETER AORTIC VALVE REPLACEMENT, TRANSFEMORAL N/A 12/30/2018   Procedure: TRANSCATHETER AORTIC VALVE REPLACEMENT, TRANSFEMORAL;  Surgeon: Sherren Mocha, MD;  Location: [place] [facility];  Service: Open Heart Surgery;  Laterality: N/A;   TUBAL LIGATION  1975    Allergies as of 09/28/2021   No Known Allergies      Medication List  Accurate as of September 28, 2021  5:23 PM. If you have any questions, ask your nurse or doctor.          STOP taking these medications    augmented betamethasone dipropionate 0.05 % cream Commonly known as: DIPROLENE-AF Stopped by: Kathlene November, MD       TAKE these medications    acetaminophen 325 MG tablet Commonly known as: TYLENOL Take 2 tablets (650 mg total) by mouth every 6 (six) hours as needed for mild pain (or Fever >/= 101).   amLODipine 5 MG tablet Commonly known as: NORVASC Take 1 tablet (5 mg total) by mouth daily.   amoxicillin 500 MG tablet Commonly known  as: AMOXIL Take 4 tablets (2,000mg ) one hour prior to all dental visits.   aspirin EC 81 MG tablet Take 81 mg by mouth daily.   CALCIUM 600 + D PO Take 600 mg by mouth 3 (three) times a week.   denosumab 60 MG/ML Soln injection Commonly known as: PROLIA Inject 60 mg into the skin every 6 (six) months. Administer in upper arm, thigh, or abdomen   EPINEPHrine 0.3 mg/0.3 mL Soaj injection Commonly known as: EPI-PEN Inject 0.3 mg into the muscle as needed for anaphylaxis.   furosemide 20 MG tablet Commonly known as: LASIX DAILY AS NEEDED FOR SWELLING   hydrochlorothiazide 12.5 MG capsule Commonly known as: MICROZIDE Take 1 capsule (12.5 mg total) by mouth daily.   Incruse Ellipta 62.5 MCG/ACT Aepb Generic drug: umeclidinium bromide TAKE 1 PUFF BY MOUTH EVERY DAY   loratadine 10 MG tablet Commonly known as: CLARITIN Take 10 mg by mouth daily as needed for allergies.   magnesium oxide 400 (240 Mg) MG tablet Commonly known as: MAG-OX Take 1 tablet (400 mg total) by mouth daily. Must keep appointment for future refills   metoprolol succinate 25 MG 24 hr tablet Commonly known as: TOPROL-XL TAKE 1 TABLET BY MOUTH EVERY DAY   multivitamin with minerals Tabs tablet Take 1 tablet by mouth daily.   rosuvastatin 40 MG tablet Commonly known as: CRESTOR TAKE 1 TABLET BY MOUTH DAILY   Ventolin HFA 108 (90 Base) MCG/ACT inhaler Generic drug: albuterol TAKE 2 PUFFS BY MOUTH EVERY 6 HOURS AS NEEDED FOR WHEEZE OR SHORTNESS OF BREATH   Vitamin D3 125 MCG (5000 UT) Caps Take 5,000 Units by mouth daily.           Objective:   Physical Exam BP 126/70 (BP Location: Left Arm, Patient Position: Sitting, Cuff Size: Small)   Pulse 80   Temp 98.1 F (36.7 C) (Oral)   Resp 18   Ht 5\' 6"  (1.676 m)   Wt 163 lb 8 oz (74.2 kg)   SpO2 93%   BMI 26.39 kg/m  General: Well developed, NAD, BMI noted Neck: No  thyromegaly  HEENT:  Normocephalic . Face symmetric, atraumatic Lungs:   Decreased breath sounds Normal respiratory effort, no intercostal retractions, no accessory muscle use. Heart: RRR,  no murmur.  Abdomen:  Not distended, soft, non-tender. No rebound or rigidity.   Lower extremities: no pretibial edema bilaterally  Skin: Exposed areas without rash. Not pale. Not jaundice Neurologic:  alert & oriented X3.  Speech normal, gait appropriate for age and unassisted Strength symmetric and appropriate for age.  Psych: Cognition and judgment appear intact.  Cooperative with normal attention span and concentration.  Behavior appropriate. No anxious or depressed appearing.     Assessment     Assessment HTN Hyperlipidemia PULM: -Emphysema, quit  tobacco 2006 -CT chest angio  05-2016 ----pulm nodule x2,reticular infiltrate ; f/u CT 11/2016 stable nodule, no f/u ----enlarged R P.A. and Ao -Abnormal PET scan 12-2019 CV: -DOE: (-) myoview  8-017, CT angio chest 05-2016:  no PE  -S/p TVAR 12/2018 -Paroxysmal A. fib DX 6/28/ 2020 in the context of acute cholecystitis, converted to NSR, not anticoagulated as of 05/25/2019 --Nonobstructive CAD (per coronary CTA) -Aspirin indefinitely, SBE prophylaxis with amoxicillin -MSK --DJD --Sacral Fx, sacral insufficiency, s/p sacroplasty (radiology) 07-2015 --H/o osteoporosis :  took fosamax while in [place], Fosamax rx again by ortho after sacral Fx 07-2015, never took it, first Prolia 11-14-2015 --T score 08-2015 >> normal (likely normal d/t DJD?) H/o vit D def: Normal level 09-2015   PLAN  HTN: BP is very good, continue amlodipine, HCTZ, metoprolol.  Check CMP High cholesterol: On rosuvastatin.  Labs. Emphysema: Doing well with no major symptoms, continue Incruse Ellipta.  Hardly ever uses her rescue inhaler Osteoporosis: Good compliance with Prolia. History of vitamin D deficiency: On otc  supplements, checking levels Pulmonary nodules:  Last visit with pulmonary 06/16/2021,  they felt that the latest imaging  showed stable pulmonary nodules. Plan is to  follow-up by 05/2022 Parotitis: Saw ENT, issue felt to be secondary to chronic dehydration. Preventive care reviewed RTC 6 to 8 months     This visit occurred during the SARS-CoV-2 public health emergency.  Safety protocols were in place, including screening questions prior to the visit, additional usage of staff PPE, and extensive cleaning of exam room while observing appropriate contact time as indicated for disinfecting solutions.

## 2021-09-28 NOTE — Assessment & Plan Note (Signed)
HTN: BP is very good, continue amlodipine, HCTZ, metoprolol.  Check CMP High cholesterol: On rosuvastatin.  Labs. Emphysema: Doing well with no major symptoms, continue Incruse Ellipta.  Hardly ever uses her rescue inhaler Osteoporosis: Good compliance with Prolia. History of vitamin D deficiency: On otc  supplements, checking levels Pulmonary nodules:  Last visit with pulmonary 06/16/2021,  they felt that the latest imaging showed stable pulmonary nodules. Plan is to  follow-up by 05/2022 Parotitis: Saw ENT, issue felt to be secondary to chronic dehydration. Preventive care reviewed RTC 6 to 8 months

## 2021-09-28 NOTE — Patient Instructions (Signed)
Check the  blood pressure monthly  BP GOAL is between 110/65 and  135/85. If it is consistently higher or lower, let me know     GO TO THE LAB : Get the blood work     [place], [place] back for  a check up in 6 months

## 2021-10-01 NOTE — Progress Notes (Signed)
Cardiology Office Note:    Date:  10/02/2021   ID:  Dominique [name], DOB 16-Oct-1941, MRN [id number]  PCP:  Colon Branch, MD   [facility] Providers Cardiologist:  [name], MD      Referring MD: Colon Branch, MD   Follow-up for aortic valve stenosis status post TAVR 01/16/2019  History of Present Illness:    Dominique [name] is a 80 y.o. female who presents for ongoing assessment and management of aortic valve stenosis status post TAVR on 01/16/2019, preprocedure cath showing angiographically normal coronary arteries.  Postoperatively the patient had severe acute anemia felt to be related to probable atrial flap and closure site and underwent emergent repair of the left femoral artery with endarterectomy and Dacron patch angioplasty.   She was admitted to the hospital for cholecystectomy.    She was also noted postoperatively to be tachycardic with heart rates of 120 beats per minutes when she was asleep, and found to have atrial fibrillation and tachypalpitations.  She was placed on Toprol-XL 25 mg daily, she was not placed on anticoagulation.  On follow-up appointment 05/12/2019 she was feeling much better, she had multiple questions about whether or not she needed to restart antihypertensive medications or to be started on anticoagulation.   It was not felt that the patient needed to be placed on anticoagulation as her atrial fibrillation was related to physiologic stress in the setting of illness.  She had no further episodes of atrial fibrillation.  We placed her on a 2-week ZIO cardiac monitor for further evaluation of atrial arrhythmia and/or tachycardia and was continued on metoprolol.   Cardiac monitor read on 01/29/2019 revealed basic rhythm was normal sinus rhythm with no bradycardic events, no recurrent atrial fibrillation or flutter, there were episodes of wide-complex that appeared to be artifactual.    [name] saw her primary care physician Dr. Kathlene November,  complaining of headaches.  Questionable TIA.  Bilateral carotid Doppler ultrasounds were completed on 06/01/2019.  The right ICA velocities were consistent with 60% to 79% stenosis.  Tardus waveform in the mid ICA distal to the stenosis indicated greater severity, and it was recommended that the patient have a CTA for better definition of RCI stenosis.  Left carotid velocities were consistent with 1 to 39% stenosis.  The patient was referred to neurology by Dr. Larose Kells.  They have scheduled her for an MRI of the brain on 06/30/2019 per notes by Dr.Sethi.    She presented to the clinic 06/15/2019 and stated she was  at the hospital due to dizziness, nausea, and vomiting.  She stated that it was not a new problem and she had been dealing with it sporadically for several years.  She stated that due to the illness she had not been very active and had been sleeping more.   She felt that her TAVR site had healed up well and that she had no pain in that area.  She denied weakness, elevated blood pressure. She also stated she had an appointment scheduled in September with her optometrist as well as her appointment with Dr. Leonie Man.    She was seen in follow-up by Sande Rives, PA-C 03/21/2021.  Her neck CT that was performed 03/02/2021 was reviewed and showed 60-70% stenosis of her right ICA as well as multiple thyroid nodules.  She was referred to vascular surgery and thyroid ultrasound was ordered.  The ultrasound confirms multiple bilateral thyroid nodules which meet criteria for surveillance.  Follow-up ultrasound  was recommended for 1 year.  She presented for follow-up alone.  She noted that she had done well since her last visit.  She continued to note rare episodes of palpitations.  She indicated that she felt she had had 3-4 episodes of palpitations after her last visit.  She denied lightheadedness and dizziness.  She denied chest pain.  She continued to have chronic shortness of breath with increased physical activity  however, her breathing improved with inhalers.  She reported an episode of dancing where she was not limited by her breathing.  She denied orthopnea and PND.  She was compliant with her furosemide.  She was noted to have chronic lower extremity edema with the left greater than right.  Her weight remains stable.  She presents the clinic today for follow-up evaluation states she feels well.  She continues to eat a heart healthy diet.  She reports that she does not eat very much meat mainly vegetables and fruit.  We reviewed her previous echocardiogram.  Her most recent LDL from 12/27/2020 shows a value 56.  She is tolerating her medications well without side effects.  She has not been able to go back to [place] recently.  She is planning to go back soon.  We we will plan a follow-up in 1 year and repeat her echocardiogram.  Today she denies chest pain, increased shortness of breath, lower extremity edema, fatigue, palpitations, melena, hematuria, hemoptysis, diaphoresis, weakness, presyncope, syncope, orthopnea, and PND.  Past Medical History:  Diagnosis Date   Atrial fibrillation ([place])    a. dx 03/2019 while in hospital with cholecystitis   COPD (chronic obstructive pulmonary disease) ([place])    Critical lower limb ischemia (HCC)    a. after TAVR developed ischemic L leg likely due to flap/closure, s/p emergent repair of left femoral artery with endarterectomy and Dacron patch angioplasty.   Emphysema lung (HCC)    Hyperlipidemia    LDL goal < 70   Hypertension    Hypomagnesemia    Normal coronary arteries 10/2018   NSVT (nonsustained ventricular tachycardia) 03/02/2019   Pulmonary HTN ([place]) 06/05/2016   Moderate with PASP 67mmHg by echo 04/2016 likely Group 3 from COPD and possibly Group 2 from pulmonary venous HTN associated with moderate AS - f/u echo 12/2018 post TAVR showed normal RVSP   Pulmonary nodules    seen on pre TAVR CT, needs 12 month CT follow up   S/P TAVR (transcatheter aortic valve  replacement) 12/30/2018   Medtronic Evolut Pro-Plus THV (size 26 mm, serial # [id number]) via the TF approach   Sacral fracture (HCC)    Severe aortic stenosis    a. s/p TAVR 12/2018.   Thoracic ascending aortic aneurysm    needs yearly follow up    Past Surgical History:  Procedure Laterality Date   APPENDECTOMY  1951   CATARACT EXTRACTION     [place] N/A 04/28/2019   Procedure: LAPAROSCOPIC CHOLECYSTECTOMY;  Surgeon: Stark Klein, MD;  Location: [facility];  Service: General;  Laterality: N/A;   COLONOSCOPY  2018   FEMORAL-POPLITEAL BYPASS GRAFT Left 12/31/2018   Procedure: PATCH ANGIOPLASTY REPAIR LEFT FEMORAL ARTERY USING [place];  Surgeon: Rosetta Posner, MD;  Location: [place];  Service: Vascular;  Laterality: Left;   IR RADIOLOGY PERIPHERAL GUIDED IV START  05/30/2017   IR RADIOLOGY PERIPHERAL GUIDED IV START  09/02/2018   IR RADIOLOGY PERIPHERAL GUIDED IV START  09/02/2018  IR US GUIDE VASC ACCESS LEFT  09/02/2018   IR US GUIDE VASC ACCESS RIGHT  05/30/2017   IR US GUIDE VASC ACCESS RIGHT  09/02/2018   JOINT REPLACEMENT  2016   sacroplasty   LAMINECTOMY     RIGHT/LEFT HEART CATH AND CORONARY ANGIOGRAPHY N/A 11/27/2018   Procedure: RIGHT/LEFT HEART CATH AND CORONARY ANGIOGRAPHY;  Surgeon: Sherren Mocha, MD;  Location: [place] [facility];  Service: Cardiovascular;  Laterality: N/A;   TEE WITHOUT CARDIOVERSION N/A 12/30/2018   Procedure: TRANSESOPHAGEAL ECHOCARDIOGRAM (TEE);  Surgeon: Sherren Mocha, MD;  Location: [place] [facility];  Service: Open Heart Surgery;  Laterality: N/A;   TOTAL HIP ARTHROPLASTY Bilateral    TRANSCATHETER AORTIC VALVE REPLACEMENT, TRANSFEMORAL N/A 12/30/2018   Procedure: TRANSCATHETER AORTIC VALVE REPLACEMENT, TRANSFEMORAL;  Surgeon: Sherren Mocha, MD;  Location: [place] [facility];  Service: Open Heart Surgery;  Laterality: N/A;   TUBAL LIGATION  1975    Current Medications: Current Meds  Medication  Sig   acetaminophen (TYLENOL) 325 MG tablet Take 2 tablets (650 mg total) by mouth every 6 (six) hours as needed for mild pain (or Fever >/= 101).   amLODipine (NORVASC) 5 MG tablet Take 1 tablet (5 mg total) by mouth daily.   amoxicillin (AMOXIL) 500 MG tablet Take 4 tablets (2,000mg ) one hour prior to all dental visits.   aspirin EC 81 MG tablet Take 81 mg by mouth daily.   Calcium Carb-Cholecalciferol (CALCIUM 600 + D PO) Take 600 mg by mouth 3 (three) times a week.    Cholecalciferol (VITAMIN D3) 5000 UNITS CAPS Take 5,000 Units by mouth daily.    denosumab (PROLIA) 60 MG/ML SOLN injection Inject 60 mg into the skin every 6 (six) months. Administer in upper arm, thigh, or abdomen   EPINEPHrine 0.3 mg/0.3 mL IJ SOAJ injection Inject 0.3 mg into the muscle as needed for anaphylaxis.   hydrochlorothiazide (MICROZIDE) 12.5 MG capsule Take 1 capsule (12.5 mg total) by mouth daily.   INCRUSE ELLIPTA 62.5 MCG/INH AEPB TAKE 1 PUFF BY MOUTH EVERY DAY   loratadine (CLARITIN) 10 MG tablet Take 10 mg by mouth daily as needed for allergies.    magnesium oxide (MAG-OX) 400 (240 Mg) MG tablet Take 1 tablet (400 mg total) by mouth daily. Must keep appointment for future refills   metoprolol succinate (TOPROL-XL) 25 MG 24 hr tablet TAKE 1 TABLET BY MOUTH EVERY DAY   Multiple Vitamin (MULTIVITAMIN WITH MINERALS) TABS tablet Take 1 tablet by mouth daily.   rosuvastatin (CRESTOR) 40 MG tablet TAKE 1 TABLET BY MOUTH DAILY   VENTOLIN HFA 108 (90 Base) MCG/ACT inhaler TAKE 2 PUFFS BY MOUTH EVERY 6 HOURS AS NEEDED FOR WHEEZE OR SHORTNESS OF BREATH     Allergies:   Patient has no known allergies.   Social History   Socioeconomic History   Marital status: Widowed    Spouse name: Not on file   Number of children: 2   Years of education: Not on file   Highest education level: Not on file  Occupational History   Occupation: n/a  Tobacco Use   Smoking status: Former    Packs/day: 1.50    Years: 40.00     Pack years: 60.00    Types: Cigarettes    Start date: 93    Quit date: 03/19/2004    Years since quitting: 17.5   Smokeless tobacco: Never   Tobacco comments:    Started smoking at age 75  Vaping Use  Vaping Use: Never used  Substance and Sexual Activity   Alcohol use: Yes    Alcohol/week: 3.0 - 4.0 standard drinks    Types: 3 - 4 Glasses of wine per week    Comment: socially   Drug use: No   Sexual activity: Not on file  Other Topics Concern   Not on file  Social History Narrative   From [place]   Lost a son to DM   1 living son   Social Determinants of Health   Financial Resource Strain: Low Risk    Difficulty of Paying Living Expenses: Not very hard  Food Insecurity: No Food Insecurity   Worried About Charity fundraiser in the Last Year: Never true   Arboriculturist in the Last Year: Never true  Transportation Needs: No Transportation Needs   Lack of Transportation (Medical): No   Lack of Transportation (Non-Medical): No  Physical Activity: Insufficiently Active   Days of Exercise per Week: 7 days   Minutes of Exercise per Session: 20 min  Stress: No Stress Concern Present   Feeling of Stress : Not at all  Social Connections: Moderately Isolated   Frequency of Communication with Friends and Family: More than three times a week   Frequency of Social Gatherings with Friends and Family: More than three times a week   Attends Religious Services: Never   Marine scientist or Organizations: Yes   Attends Music therapist: More than 4 times per year   Marital Status: Widowed     Family History: The patient's family history includes Breast cancer in her mother; Cancer in her brother; Diabetes in her son. There is no history of Colon cancer, CAD, Colon polyps, Rectal cancer, or Stomach cancer.  ROS:   Please see the history of present illness.     All other systems reviewed and are negative.   Risk Assessment/Calculations:           Physical  Exam:    VS:  BP 138/72   Pulse 73   Ht 5\' 6"  (1.676 m)   Wt 165 lb (74.8 kg)   BMI 26.63 kg/m     Wt Readings from Last 3 Encounters:  10/02/21 165 lb (74.8 kg)  09/28/21 163 lb 8 oz (74.2 kg)  08/17/21 160 lb (72.6 kg)     GEN:  Well nourished, well developed in no acute distress HEENT: Normal NECK: No JVD; No carotid bruits LYMPHATICS: No lymphadenopathy CARDIAC: RRR, no murmurs, rubs, gallops RESPIRATORY:  Clear to auscultation without rales, wheezing or rhonchi  ABDOMEN: Soft, non-tender, non-distended MUSCULOSKELETAL:  No edema; No deformity  SKIN: Warm and dry NEUROLOGIC:  Alert and oriented x 3 PSYCHIATRIC:  Normal affect    EKGs/Labs/Other Studies Reviewed:    The following studies were reviewed today: Cardiac catheterization 11/27/2018  EKG:  EKG is  ordered today.  The ekg ordered today demonstrates normal sinus rhythm 73 bpm no ST or T wave deviation.  - Right Carotid: Velocities in the right ICA are consistent with a 1-39%  stenosis. There is no evidence of a 60-79% stenosis as seen on the  prior exam.  - Left Carotid: Velocities in the left ICA are consistent with a 1-39%  stenosis.  - Vertebrals:  Bilateral vertebral arteries demonstrate antegrade flow.  - Subclavians: Normal flow hemodynamics were seen in bilateral subclavian  arteries.   *See table(s) above for measurements and observations.   Suggest follow up study PRN.  _______________   Echocardiogram 08/02/2020: Impressions: 1. Left ventricular ejection fraction, by estimation, is 65 to 70%. The  left ventricle has normal function. The left ventricle has no regional  wall motion abnormalities. Left ventricular diastolic parameters are  consistent with Grade I diastolic  dysfunction (impaired relaxation). The average left ventricular global  longitudinal strain is -19.3 %. The global longitudinal strain is normal.   2. Right ventricular systolic function is normal. The right ventricular   size is normal. There is mildly elevated pulmonary artery systolic  pressure.   3. The mitral valve is normal in structure. Trivial mitral valve  regurgitation. No evidence of mitral stenosis.   4. Trivial paravalvular leak. The aortic valve has been  repaired/replaced. Aortic valve regurgitation is not visualized. No aortic  stenosis is present. There is a 26 mm CoreValve-Evolut Pro prosthetic  (TAVR) valve present in the aortic position.  Procedure Date: 12/2018. Aortic valve mean gradient measures 8.0 mmHg.   5. The inferior vena cava is normal in size with greater than 50%  respiratory variability, suggesting right atrial pressure of 3 mmHg.   Comparison(s): 01/14/20 EF 65-70%. AV 7mmHg peak PG, 78mmHg mean PG.  _______________   Monitor 07/2020: Several runs of SVT up to 7 beats.  Fastest rate 141 bpm Occasional PVCs  Recent Labs: 03/07/2021: TSH 1.880 05/20/2021: Hemoglobin 14.7; Platelets 238 09/28/2021: ALT 22; BUN 17; Creatinine, Ser 0.83; Potassium 3.7; Sodium 142  Recent Lipid Panel    Component Value Date/Time   CHOL 140 09/28/2021 0937   CHOL 139 05/28/2019 1740   TRIG 87.0 09/28/2021 0937   HDL 64.90 09/28/2021 0937   HDL 54 05/28/2019 1740   CHOLHDL 2 09/28/2021 0937   VLDL 17.4 09/28/2021 0937   LDLCALC 57 09/28/2021 0937   LDLCALC 59 05/28/2019 1740    ASSESSMENT & PLAN    Aortic stenosis-no increased DOE or activity intolerance.  Breathing remained stable.  Status post TAVR 3/20.  Echocardiogram 10/21 showed no aortic valve regurgitation and no aortic stenosis.  TAVR well-positioned. Repeat echocardiogram  Lower extremity edema-euvolemic today.  No increased DOE or activity intolerance.  Recent lab work unremarkable 09/28/2021. Continue HCTZ, furosemide Heart healthy low-sodium diet-salty 6 given Increase physical activity as tolerated  Coronary artery disease-noted to be nonobstructive CAD on coronary CTA 2018 and 19.  Cardiac catheterization 1/20 showed  normal coronary anatomy. Continue rosuvastatin, aspirin Heart healthy low-sodium diet-salty 6 given Increase physical activity as tolerated  Hyperlipidemia/carotid stenosis-LDL 56 on 12/27/20 .  ICA stenosis 60 to 70% noted 5/22.  Follows with vascular surgery. Continue aspirin, rosuvastatin Heart healthy low-sodium high-fiber diet Increase physical activity as tolerated   Essential hypertension-BP today 138/72.  Well-controlled at home. Continue HCTZ, metoprolol Heart healthy low-sodium diet-salty 6 given Increase physical activity as tolerated  Paroxysmal SVT-no further episodes since acute cholecystitis 6/20.  Previously not started on anticoagulation.  Not identified on follow-up cardiac event monitors. Continue metoprolol  Thyroid nodules-noted on neck CTA and confirmed with bilateral thyroid ultrasound.  Surveillance recommended. Follows with PCP.  Disposition: Follow-up with Dr. [name] or me in 9-12 months.       Medication Adjustments/Labs and Tests Ordered: Current medicines are reviewed at length with the patient today.  Concerns regarding medicines are outlined above.  Orders Placed This Encounter  Procedures   EKG 12-Lead   ECHOCARDIOGRAM COMPLETE   No orders of the defined types were placed in this encounter.   Patient Instructions  Medication Instructions:  Your Physician recommend you  continue on your current medication as directed.    *If you need a refill on your cardiac medications before your next appointment, please call your pharmacy*   Lab Work: None ordered today   Testing/Procedures: Your physician has requested that you have an echocardiogram. Echocardiography is a painless test that uses sound waves to create images of your heart. It provides your doctor with information about the size and shape of your heart and how well your heart's chambers and valves are working. This procedure takes approximately one hour. There are no restrictions for  this procedure. [place], you and your health needs are our priority.  As part of our continuing mission to provide you with exceptional heart care, we have created designated Provider Care Teams.  These Care Teams include your primary Cardiologist (physician) and Advanced Practice Providers (APPs -  Physician Assistants and Nurse Practitioners) who all work together to provide you with the care you need, when you need it.  We recommend signing up for the patient portal called "MyChart".  Sign up information is provided on this After Visit Summary.  MyChart is used to connect with patients for Virtual Visits (Telemedicine).  Patients are able to view lab/test results, encounter notes, upcoming appointments, etc.  Non-urgent messages can be sent to your provider as well.   To learn more about what you can do with MyChart, go to [email or web address].    Your next appointment:   9-12 month(s)  The format for your next appointment:   In Person  Provider:   [name], MD, [name], NP, or Coletta Memos, NP    Other Instructions None today    Signed, Deberah Pelton, NP  10/02/2021 12:10 PM      Notice: This dictation was prepared with Dragon dictation along with smaller phrase technology. Any transcriptional errors that result from this process are unintentional and may not be corrected upon review.  I spent 14 minutes examining this patient, reviewing medications, and using patient centered shared decision making involving her cardiac care.  Prior to her visit I spent greater than 20 minutes reviewing her past medical history,  medications, and prior cardiac tests.

## 2021-10-02 ENCOUNTER — Encounter (HOSPITAL_BASED_OUTPATIENT_CLINIC_OR_DEPARTMENT_OTHER): Payer: Self-pay | Admitting: General Practice

## 2021-10-02 ENCOUNTER — Other Ambulatory Visit: Payer: Self-pay

## 2021-10-02 ENCOUNTER — Ambulatory Visit (INDEPENDENT_AMBULATORY_CARE_PROVIDER_SITE_OTHER): Payer: Medicare Other | Admitting: General Practice

## 2021-10-02 VITALS — BP 138/72 | HR 73 | Ht 66.0 in | Wt 165.0 lb

## 2021-10-02 DIAGNOSIS — I2584 Coronary atherosclerosis due to calcified coronary lesion: Secondary | ICD-10-CM | POA: Diagnosis not present

## 2021-10-02 DIAGNOSIS — I10 Essential (primary) hypertension: Secondary | ICD-10-CM

## 2021-10-02 DIAGNOSIS — E042 Nontoxic multinodular goiter: Secondary | ICD-10-CM | POA: Diagnosis not present

## 2021-10-02 DIAGNOSIS — I2510 Atherosclerotic heart disease of native coronary artery without angina pectoris: Secondary | ICD-10-CM

## 2021-10-02 DIAGNOSIS — I350 Nonrheumatic aortic (valve) stenosis: Secondary | ICD-10-CM | POA: Diagnosis not present

## 2021-10-02 DIAGNOSIS — E7800 Pure hypercholesterolemia, unspecified: Secondary | ICD-10-CM

## 2021-10-02 DIAGNOSIS — I6529 Occlusion and stenosis of unspecified carotid artery: Secondary | ICD-10-CM

## 2021-10-02 DIAGNOSIS — R600 Localized edema: Secondary | ICD-10-CM

## 2021-10-02 DIAGNOSIS — I471 Supraventricular tachycardia: Secondary | ICD-10-CM

## 2021-10-02 NOTE — Patient Instructions (Signed)
Medication Instructions:  Your Physician recommend you continue on your current medication as directed.    *If you need a refill on your cardiac medications before your next appointment, please call your pharmacy*   Lab Work: None ordered today   Testing/Procedures: Your physician has requested that you have an echocardiogram. Echocardiography is a painless test that uses sound waves to create images of your heart. It provides your doctor with information about the size and shape of your heart and how well your heart's chambers and valves are working. This procedure takes approximately one hour. There are no restrictions for this procedure. [place], you and your health needs are our priority.  As part of our continuing mission to provide you with exceptional heart care, we have created designated Provider Care Teams.  These Care Teams include your primary Cardiologist (physician) and Advanced Practice Providers (APPs -  Physician Assistants and Nurse Practitioners) who all work together to provide you with the care you need, when you need it.  We recommend signing up for the patient portal called "MyChart".  Sign up information is provided on this After Visit Summary.  MyChart is used to connect with patients for Virtual Visits (Telemedicine).  Patients are able to view lab/test results, encounter notes, upcoming appointments, etc.  Non-urgent messages can be sent to your provider as well.   To learn more about what you can do with MyChart, go to [email or web address].    Your next appointment:   9-12 month(s)  The format for your next appointment:   In Person  Provider:   [name], MD, [name], NP, or Coletta Memos, NP    Other Instructions None today

## 2021-10-05 DIAGNOSIS — Z20822 Contact with and (suspected) exposure to covid-19: Secondary | ICD-10-CM | POA: Diagnosis not present

## 2021-10-18 ENCOUNTER — Other Ambulatory Visit: Payer: Self-pay

## 2021-10-18 ENCOUNTER — Ambulatory Visit (INDEPENDENT_AMBULATORY_CARE_PROVIDER_SITE_OTHER): Payer: Medicare Other

## 2021-10-18 DIAGNOSIS — I350 Nonrheumatic aortic (valve) stenosis: Secondary | ICD-10-CM | POA: Diagnosis not present

## 2021-10-18 LAB — ECHOCARDIOGRAM COMPLETE
AR max vel: 1.21 cm2
AV Area VTI: 1.22 cm2
AV Area mean vel: 1.18 cm2
AV Mean grad: 7.7 mmHg
AV Peak grad: 15.1 mmHg
Ao pk vel: 1.94 m/s
Area-P 1/2: 3.12 cm2
Calc EF: 77.7 %
S' Lateral: 2.22 cm
Single Plane A2C EF: 82.7 %
Single Plane A4C EF: 70.3 %

## 2021-12-25 DIAGNOSIS — Z20822 Contact with and (suspected) exposure to covid-19: Secondary | ICD-10-CM | POA: Diagnosis not present

## 2022-01-08 DIAGNOSIS — Z0489 Encounter for examination and observation for other specified reasons: Secondary | ICD-10-CM | POA: Diagnosis not present

## 2022-01-16 DIAGNOSIS — Z20822 Contact with and (suspected) exposure to covid-19: Secondary | ICD-10-CM | POA: Diagnosis not present

## 2022-01-27 DIAGNOSIS — Z20822 Contact with and (suspected) exposure to covid-19: Secondary | ICD-10-CM | POA: Diagnosis not present

## 2022-01-30 DIAGNOSIS — Z1152 Encounter for screening for COVID-19: Secondary | ICD-10-CM | POA: Diagnosis not present

## 2022-01-30 DIAGNOSIS — Z20828 Contact with and (suspected) exposure to other viral communicable diseases: Secondary | ICD-10-CM | POA: Diagnosis not present

## 2022-02-01 DIAGNOSIS — Z20822 Contact with and (suspected) exposure to covid-19: Secondary | ICD-10-CM | POA: Diagnosis not present

## 2022-02-08 ENCOUNTER — Telehealth: Payer: Self-pay

## 2022-02-08 NOTE — Telephone Encounter (Signed)
Last Prolia inj 08/29/21 ?Next Prolia inj due 02/27/22 ?

## 2022-02-09 ENCOUNTER — Other Ambulatory Visit: Payer: Self-pay | Admitting: Oral Surgery

## 2022-02-09 DIAGNOSIS — D1039 Benign neoplasm of other parts of mouth: Secondary | ICD-10-CM | POA: Diagnosis not present

## 2022-02-09 DIAGNOSIS — K136 Irritative hyperplasia of oral mucosa: Secondary | ICD-10-CM | POA: Diagnosis not present

## 2022-02-19 NOTE — Telephone Encounter (Signed)
Pt ready for scheduling on or after 02/27/22 ? ?Out-of-pocket cost due at time of visit: $301 ? ?Primary: Medicare ?Prolia co-insurance: 20% (approximately $276) ?Admin fee co-insurance: 20% (approximately $25) ? ?Secondary: [place] Medicare Supp ?Prolia co-insurance: Covers Medicare Part B co-insurance ?Admin fee co-insurance: Covers Medicare Part B co-insurance ? ?Deductible:  Covered by secondary ? ? ?Prior Auth: not required ?PA# ?Valid:  ? ?** This summary of benefits is an estimation of the patient's out-of-pocket cost. Exact cost may vary based on individual plan coverage.  ? ?

## 2022-02-26 DIAGNOSIS — Z20822 Contact with and (suspected) exposure to covid-19: Secondary | ICD-10-CM | POA: Diagnosis not present

## 2022-02-28 ENCOUNTER — Other Ambulatory Visit: Payer: Self-pay | Admitting: Cardiovascular Disease

## 2022-02-28 NOTE — Telephone Encounter (Signed)
Rx(s) sent to pharmacy electronically.  

## 2022-03-01 NOTE — Telephone Encounter (Signed)
Prolia: Left vm to return call.  ? ? ?Pt ready for scheduling on or after 02/27/22 ?  ?Out-of-pocket cost due at time of visit: $301 ?

## 2022-03-03 DIAGNOSIS — Z20822 Contact with and (suspected) exposure to covid-19: Secondary | ICD-10-CM | POA: Diagnosis not present

## 2022-03-06 NOTE — Telephone Encounter (Signed)
Pt called to schedule appt for prolia shot. After informing her of the OOP cost for the shot, she stated that she has never paid for the shot and that her insurance always covered it. Please Advise.  ?

## 2022-03-15 NOTE — Telephone Encounter (Signed)
Pt ready for scheduling on or after 02/27/22   Out-of-pocket cost due at time of visit: $0   Primary: Medicare Prolia co-insurance: 20% (approximately $276) Admin fee co-insurance: 20% (approximately $25)   Secondary: [employer] Supp Prolia co-insurance: Covers Medicare Part B co-insurance Admin fee co-insurance: Covers Medicare Part B co-insurance   Deductible:  Covered by secondary     Prior Auth: not required PA# Valid:    ** This summary of benefits is an estimation of the patient's out-of-pocket cost. Exact cost may vary based on individual plan coverage.

## 2022-03-15 NOTE — Telephone Encounter (Signed)
Pt scheduled  

## 2022-03-18 ENCOUNTER — Other Ambulatory Visit: Payer: Self-pay | Admitting: Student

## 2022-03-19 NOTE — Telephone Encounter (Signed)
Rx(s) sent to pharmacy electronically.  

## 2022-03-20 ENCOUNTER — Ambulatory Visit (INDEPENDENT_AMBULATORY_CARE_PROVIDER_SITE_OTHER): Payer: Medicare Other | Admitting: *Deleted

## 2022-03-20 DIAGNOSIS — M810 Age-related osteoporosis without current pathological fracture: Secondary | ICD-10-CM | POA: Diagnosis not present

## 2022-03-20 MED ORDER — DENOSUMAB 60 MG/ML ~~LOC~~ SOSY
60.0000 mg | PREFILLED_SYRINGE | Freq: Once | SUBCUTANEOUS | Status: AC
  Administered 2022-03-20: 60 mg via SUBCUTANEOUS

## 2022-03-20 NOTE — Progress Notes (Signed)
Patient here for Prolia injection per PCP orders.  Injection given in left arm and patient tolerated well.

## 2022-03-21 ENCOUNTER — Telehealth: Payer: Self-pay | Admitting: *Deleted

## 2022-03-21 DIAGNOSIS — Z20828 Contact with and (suspected) exposure to other viral communicable diseases: Secondary | ICD-10-CM | POA: Diagnosis not present

## 2022-03-21 NOTE — Telephone Encounter (Signed)
Prolia given yesterday.

## 2022-03-26 NOTE — Telephone Encounter (Signed)
Kem Boroughs D, CMA 5 days ago   Prolia given yesterday.      Note

## 2022-03-27 DIAGNOSIS — Z20828 Contact with and (suspected) exposure to other viral communicable diseases: Secondary | ICD-10-CM | POA: Diagnosis not present

## 2022-03-29 ENCOUNTER — Encounter: Payer: Self-pay | Admitting: Internal Medicine

## 2022-03-29 ENCOUNTER — Ambulatory Visit (INDEPENDENT_AMBULATORY_CARE_PROVIDER_SITE_OTHER): Payer: Medicare Other | Admitting: Internal Medicine

## 2022-03-29 VITALS — BP 132/70 | HR 68 | Temp 98.2°F | Resp 16 | Ht 66.0 in | Wt 163.1 lb

## 2022-03-29 DIAGNOSIS — M810 Age-related osteoporosis without current pathological fracture: Secondary | ICD-10-CM

## 2022-03-29 DIAGNOSIS — I6523 Occlusion and stenosis of bilateral carotid arteries: Secondary | ICD-10-CM

## 2022-03-29 DIAGNOSIS — I10 Essential (primary) hypertension: Secondary | ICD-10-CM | POA: Diagnosis not present

## 2022-03-29 LAB — CBC WITH DIFFERENTIAL/PLATELET
Basophils Absolute: 0.1 10*3/uL (ref 0.0–0.1)
Basophils Relative: 0.8 % (ref 0.0–3.0)
Eosinophils Absolute: 0.3 10*3/uL (ref 0.0–0.7)
Eosinophils Relative: 4.6 % (ref 0.0–5.0)
HCT: 42.1 % (ref 36.0–46.0)
Hemoglobin: 14 g/dL (ref 12.0–15.0)
Lymphocytes Relative: 22.4 % (ref 12.0–46.0)
Lymphs Abs: 1.5 10*3/uL (ref 0.7–4.0)
MCHC: 33.3 g/dL (ref 30.0–36.0)
MCV: 92.9 fl (ref 78.0–100.0)
Monocytes Absolute: 0.7 10*3/uL (ref 0.1–1.0)
Monocytes Relative: 9.6 % (ref 3.0–12.0)
Neutro Abs: 4.3 10*3/uL (ref 1.4–7.7)
Neutrophils Relative %: 62.6 % (ref 43.0–77.0)
Platelets: 239 10*3/uL (ref 150.0–400.0)
RBC: 4.54 Mil/uL (ref 3.87–5.11)
RDW: 14.3 % (ref 11.5–15.5)
WBC: 6.9 10*3/uL (ref 4.0–10.5)

## 2022-03-29 LAB — BASIC METABOLIC PANEL
BUN: 14 mg/dL (ref 6–23)
CO2: 32 mEq/L (ref 19–32)
Calcium: 9.6 mg/dL (ref 8.4–10.5)
Chloride: 99 mEq/L (ref 96–112)
Creatinine, Ser: 0.74 mg/dL (ref 0.40–1.20)
GFR: 75.61 mL/min (ref >60.00)
Glucose, Bld: 86 mg/dL (ref 70–99)
Potassium: 4 mEq/L (ref 3.5–5.1)
Sodium: 140 mEq/L (ref 135–145)

## 2022-03-29 NOTE — Patient Instructions (Signed)
Check the  blood pressure regularly BP GOAL is between 110/65 and  135/85. If it is consistently higher or lower, let me know      GO TO THE LAB : Get the blood work     [place], [place] back for   a checkup in December

## 2022-03-29 NOTE — Progress Notes (Unsigned)
Subjective:    Patient ID: Dominique [name], female    DOB: 1940/12/23, 81 y.o.   MRN: [id number]  DOS:  03/29/2022 Type of visit - description: f/u  Since the last office visit is doing well. Has no major concerns.  Denies chest pain or difficulty breathing. Very rarely has palpitations, they last for seconds, no associated with other symptoms Denies any major problems with cough. No edema, hardly ever uses lasix  Review of Systems See above   Past Medical History:  Diagnosis Date   Atrial fibrillation ([place])    a. dx 03/2019 while in hospital with cholecystitis   COPD (chronic obstructive pulmonary disease) ([place])    Critical lower limb ischemia (HCC)    a. after TAVR developed ischemic L leg likely due to flap/closure, s/p emergent repair of left femoral artery with endarterectomy and Dacron patch angioplasty.   Emphysema lung (HCC)    Hyperlipidemia    LDL goal < 70   Hypertension    Hypomagnesemia    Normal coronary arteries 10/2018   NSVT (nonsustained ventricular tachycardia) ([place]) 03/02/2019   Pulmonary HTN ([place]) 06/05/2016   Moderate with PASP 23mHg by echo 04/2016 likely Group 3 from COPD and possibly Group 2 from pulmonary venous HTN associated with moderate AS - f/u echo 12/2018 post TAVR showed normal RVSP   Pulmonary nodules    seen on pre TAVR CT, needs 12 month CT follow up   S/P TAVR (transcatheter aortic valve replacement) 12/30/2018   Medtronic Evolut Pro-Plus THV (size 26 mm, serial # [id number] via the TF approach   Sacral fracture (HCC)    Severe aortic stenosis    a. s/p TAVR 12/2018.   Thoracic ascending aortic aneurysm ([place]    needs yearly follow up    Past Surgical History:  Procedure Laterality Date   [place]/A 04/28/2019   Procedure: LAPAROSCOPIC CHOLECYSTECTOMY;  Surgeon: BStark Klein MD;  Location: [facility];  Service: General;  Laterality: N/A;   COLONOSCOPY   2018   FEMORAL-POPLITEAL BYPASS GRAFT Left 12/31/2018   Procedure: PATCH ANGIOPLASTY REPAIR LEFT FEMORAL ARTERY USING [place]  Surgeon: ERosetta Posner MD;  [facility]  Service: Vascular;  Laterality: Left;   IR RADIOLOGY PERIPHERAL GUIDED IV START  05/30/2017   IR RADIOLOGY PERIPHERAL GUIDED IV START  09/02/2018   IR RADIOLOGY PERIPHERAL GUIDED IV START  09/02/2018   IR [place] ACCESS LEFT  09/02/2018   IR [place] V[place] ACCESS RIGHT  05/30/2017   IR [place] VASC ACCESS RIGHT  09/02/2018   JOINT REPLACEMENT  2016   sacroplasty   LAMINECTOMY     RIGHT/LEFT HEART CATH AND CORONARY ANGIOGRAPHY N/A 11/27/2018   Procedure: RIGHT/LEFT HEART CATH AND CORONARY ANGIOGRAPHY;  Surgeon: CSherren Mocha MD;  [facility];  Service: Cardiovascular;  Laterality: N/A;   TEE WITHOUT CARDIOVERSION N/A 12/30/2018   Procedure: TRANSESOPHAGEAL ECHOCARDIOGRAM (TEE);  Surgeon: CSherren Mocha MD;  [facility];  Service: Open Heart Surgery;  Laterality: N/A;   TOTAL HIP ARTHROPLASTY Bilateral    TRANSCATHETER AORTIC VALVE REPLACEMENT, TRANSFEMORAL N/A 12/30/2018   Procedure: TRANSCATHETER AORTIC VALVE REPLACEMENT, TRANSFEMORAL;  Surgeon: CSherren Mocha MD;  [facility];  Service: Open Heart Surgery;  Laterality: N/A;   TUBAL LIGATION  1975    Current Outpatient Medications  Medication Instructions   acetaminophen (TYLENOL) 650 mg, Oral, Every 6 hours PRN   amLODipine (NORVASC) 5 mg, Oral, Daily   amoxicillin (AMOXIL) 500 MG tablet Take 4 tablets (2,'000mg'$ ) one hour prior to all dental visits.   aspirin EC 81 mg, Daily   Calcium Carb-Cholecalciferol (CALCIUM 600 + D PO) 600 mg, Oral, 3 times weekly   denosumab (PROLIA) 60 mg, Subcutaneous, Every 6 months, Administer in upper arm, thigh, or abdomen    EPINEPHrine (EPI-PEN) 0.3 mg, As needed   furosemide (LASIX) 20 MG tablet DAILY AS NEEDED FOR SWELLING   hydrochlorothiazide  (MICROZIDE) 12.5 MG capsule TAKE 1 CAPSULE BY MOUTH EVERY DAY   INCRUSE ELLIPTA 62.5 MCG/INH AEPB TAKE 1 PUFF BY MOUTH EVERY DAY   loratadine (CLARITIN) 10 mg, Oral, Daily PRN   magnesium oxide (MAG-OX) 400 mg, Oral, Daily, Must keep appointment for future refills   metoprolol succinate (TOPROL-XL) 25 MG 24 hr tablet TAKE 1 TABLET BY MOUTH EVERY DAY   Multiple Vitamin (MULTIVITAMIN WITH MINERALS) TABS tablet 1 tablet, Oral, Daily   rosuvastatin (CRESTOR) 40 MG tablet TAKE 1 TABLET BY MOUTH DAILY   VENTOLIN HFA 108 (90 Base) MCG/ACT inhaler TAKE 2 PUFFS BY MOUTH EVERY 6 HOURS AS NEEDED FOR WHEEZE OR SHORTNESS OF BREATH   Vitamin D3 5,000 Units, Oral, Daily       Objective:   Physical Exam BP 132/70   Pulse 68   Temp 98.2 F (36.8 C) (Oral)   Resp 16   Ht '5\' 6"'$  (1.676 m)   Wt 163 lb 2 oz (74 kg)   SpO2 94%   BMI 26.33 kg/m  General:   Well developed, NAD, BMI noted. HEENT:  Normocephalic . Face symmetric, atraumatic Lungs:  CTA B Normal respiratory effort, no intercostal retractions, no accessory muscle use. Heart: RRR,  no murmur.  Lower extremities: no pretibial edema bilaterally  Skin: Not pale. Not jaundice Neurologic:  alert & oriented X3.  Speech normal, gait appropriate for age and unassisted Psych--  Cognition and judgment appear intact.  Cooperative with normal attention span and concentration.  Behavior appropriate. No anxious or depressed appearing.      Assessment    Assessment Hyperglycemia HTN Hyperlipidemia PULM: -Emphysema, quit tobacco 2006 -CT chest angio  05-2016 ----pulm nodule x2,reticular infiltrate ; f/u CT 11/2016 stable nodule, no f/u ----enlarged R P.A. and Ao -Abnormal PET scan 12-2019 CV: -DOE: (-) myoview  8-017, CT angio chest 05-2016:  no PE  -S/p TVAR 12/2018 -Paroxysmal A. fib DX 6/28/ 2020 in the context of acute cholecystitis, converted to NSR, not anticoagulated as of 05/25/2019 --Nonobstructive CAD (per coronary CTA) -Aspirin  indefinitely, SBE prophylaxis with amoxicillin -MSK --DJD --Sacral Fx, sacral insufficiency, s/p sacroplasty (radiology) 07-2015 --H/o osteoporosis :  took fosamax while in [place], Fosamax rx again by ortho after sacral Fx 07-2015, never took it, first Prolia 11-14-2015 --T score 08-2015 >> normal (likely normal d/t DJD?) H/o vit D def: Normal level 09-2015   PLAN  Hyperglycemia: Last A1c is stable. HTN:BP is very good, continue amlodipine, HCTZ, metoprolol.  Takes rarely Lasix as needed for edema, check BMP and CBC. Osteoporosis: Had a normal bone density test before however started Fosamax, on osteoporosis treatment d/t h/o  a  sacral fracture back in 2016 subsequently switched to Prolia in 2017.  D/w pt doing a DEXA, pt declines.  It is actually okay since the plan is to continue with Prolia indefinitely. Carotid disease: Last seen by vascular surgery  03/2021, was recommended to be seen a year later. RTC 6 months

## 2022-03-30 NOTE — Assessment & Plan Note (Signed)
Hyperglycemia: Last A1c is stable. HTN:BP is very good, continue amlodipine, HCTZ, metoprolol.  Takes rarely Lasix as needed for edema, check BMP and CBC. Osteoporosis: Had a normal bone density test before however started Fosamax, on osteoporosis treatment d/t h/o  a  sacral fracture back in 2016 subsequently switched to Prolia in 2017.  D/w pt doing a DEXA, pt declines.  It is actually okay since the plan is to continue with Prolia indefinitely. Carotid disease: Last seen by vascular surgery 03/2021, was recommended to be seen a year later. RTC 6 months

## 2022-05-04 ENCOUNTER — Telehealth: Payer: Self-pay | Admitting: Internal Medicine

## 2022-05-04 MED ORDER — UMECLIDINIUM BROMIDE 62.5 MCG/ACT IN AEPB: 1.0000 | INHALATION_SPRAY | Freq: Every day | RESPIRATORY_TRACT | 5 refills | Status: DC

## 2022-05-04 NOTE — Telephone Encounter (Signed)
Rx sent 

## 2022-05-04 NOTE — Telephone Encounter (Signed)
Medication: INCRUSE ELLIPTA 62.5 MCG/INH AEPB  Has the patient contacted their pharmacy? Yes.    Preferred Pharmacy (with phone number or street name): \ [id number] [place] [id number] Phone:  [phone] Fax:  [phone]  Agent: Please be advised that RX refills may take up to 3 business days. We ask that you follow-up with your pharmacy.

## 2022-05-30 ENCOUNTER — Other Ambulatory Visit (HOSPITAL_BASED_OUTPATIENT_CLINIC_OR_DEPARTMENT_OTHER): Payer: Medicare Other

## 2022-06-08 ENCOUNTER — Other Ambulatory Visit: Payer: Self-pay | Admitting: Internal Medicine

## 2022-06-12 ENCOUNTER — Other Ambulatory Visit (HOSPITAL_BASED_OUTPATIENT_CLINIC_OR_DEPARTMENT_OTHER): Payer: Medicare Other

## 2022-06-13 ENCOUNTER — Ambulatory Visit (HOSPITAL_BASED_OUTPATIENT_CLINIC_OR_DEPARTMENT_OTHER)
Admission: RE | Admit: 2022-06-13 | Discharge: 2022-06-13 | Disposition: A | Discharge status: Home / Self Care | Payer: Medicare Other | Source: Ambulatory Visit | Attending: Pulmonary Disease | Admitting: Pulmonary Disease

## 2022-06-13 DIAGNOSIS — J439 Emphysema, unspecified: Secondary | ICD-10-CM | POA: Diagnosis not present

## 2022-06-13 DIAGNOSIS — R918 Other nonspecific abnormal finding of lung field: Secondary | ICD-10-CM | POA: Diagnosis not present

## 2022-06-13 DIAGNOSIS — I700 Atherosclerosis of aorta: Secondary | ICD-10-CM | POA: Diagnosis not present

## 2022-06-20 ENCOUNTER — Telehealth: Payer: Self-pay | Admitting: Acute Care

## 2022-06-20 NOTE — Telephone Encounter (Signed)
Pt does not have an appt with SG until Sept 2023. Dr. Valeta Harms, please advise on the results of pt's recent CT.

## 2022-06-20 NOTE — Telephone Encounter (Signed)
Garner Nash, DO  You; [facility] Triage Pool; Magdalen Spatz, NP 43 minutes ago (12:49 PM)    Lesions are stable. They are ground glass and subsolid. Still concerned for indolent neoplasm/malignancy   Thanks    BLI    Garner Nash, DO  [facility]  06/20/2022 12:49 PM     Attempted to call pt but unable to reach. Left message for her to return call.

## 2022-06-21 NOTE — Telephone Encounter (Signed)
Called patient and went over CT scan. Patient verbalized understanding. Patient is also understanding that at office visit Judson Roch will discuss doing a biopsy with her or just observing for now. Patient understands and is aware of follow up date and time. Nothing further needed

## 2022-06-22 ENCOUNTER — Telehealth: Payer: Self-pay | Admitting: Acute Care

## 2022-07-03 ENCOUNTER — Other Ambulatory Visit: Payer: Self-pay | Admitting: Cardiovascular Disease

## 2022-07-03 NOTE — Telephone Encounter (Signed)
Rx request sent to pharmacy.  

## 2022-07-06 ENCOUNTER — Encounter: Payer: Self-pay | Admitting: Acute Care

## 2022-07-06 ENCOUNTER — Ambulatory Visit (INDEPENDENT_AMBULATORY_CARE_PROVIDER_SITE_OTHER): Payer: Medicare Other | Admitting: Acute Care

## 2022-07-06 VITALS — BP 128/70 | HR 71 | Temp 98.4°F | Ht 66.0 in | Wt 158.8 lb

## 2022-07-06 DIAGNOSIS — I6523 Occlusion and stenosis of bilateral carotid arteries: Secondary | ICD-10-CM | POA: Diagnosis not present

## 2022-07-06 DIAGNOSIS — Z87891 Personal history of nicotine dependence: Secondary | ICD-10-CM

## 2022-07-06 DIAGNOSIS — R918 Other nonspecific abnormal finding of lung field: Secondary | ICD-10-CM | POA: Diagnosis not present

## 2022-07-06 NOTE — Progress Notes (Signed)
History of Present Illness Dominique [name] is a 81 y.o. female former smoker ( 60 pack year history quit 2005) with past medical history of COPD, atrial fibrillation, TAVR placement by Dr. [name] in 2021,  hypertension and moderate pulmonary hypertension. She is followed by Dr. Valeta Harms for bilateral upper lobe subsolid and ground-glass nodules.  Synopsis HPI Dominique [name] is a 29 with a history of tobacco (60 pack years), COPD, atrial fibrillation, TAVR, hypertension, moderate secondary PAH.Marland Kitchen  She was seen by Dr. Valeta Harms in April 2021 to evaluate abnormal chest imaging in March. CT chest 01/14/2020 showed a stable (compared with 06/01/2017) subsolid posterior left upper lobe focus 2.4 x 1.3 cm distorting the left major fissure, a small medial LUL pleural based nodule, scattered small groundglass nodules largest 1.8 cm in the posterior right upper lobe.  PET scan done on 01/27/2020 showed that the very medial LUL nodule was hypermetabolic, no hypermetabolism noted in the other nodular foci.  Diagnostics are going to be difficult because the hypermetabolic nodule is in a location that will be very difficult to reach by navigational bronchoscopy.  The poorly formed left upper lobe and groundglass right upper lobe nodules would be reachable.These have been followed with imaging since 2021, appearance has been suspicious, however nodules have remained stable in surveillance imaging.      07/06/2022 Pt. Presents for follow up. She has been undergoing  close monitoring of her ground glass pulmonary nodules with annual scans as noted above. Dr. Valeta Harms feels they are an  indolent neoplasm/malignancy , but patient has been preferring to just monitor with imaging. Super D CT was last done 06/13/2022, which has again shown nodules that are unchanged in appearance , and recommend one year interval follow-up to complete 5 years total follow-up based on current recommendations may be helpful as these could represent small  areas of indolent pulmonary neoplasm.I have reviewed these results with the patient . We discussed the option of PET now, Biopsy now, and continued surveillance Ct chest in either 6 months or 1 year. She is concerned about the risks of bronchoscopy. She is comfortable with  6 month follow up CT and PET if any changes at that time. I have asked to to seek care sooner if she has any blood in sputum or unexplained weight loss. She verbalized understanding.   PFT 12/09/2018 reviewed >> FEV1 1.06L  Test Results: Super D CT Chest 06/13/2022 Unchanged appearance of bilateral upper lobe subsolid and ground-glass nodules. One year interval follow-up to complete 5 years total follow-up based on current recommendations may be helpful as these could represent small areas of indolent pulmonary neoplasm. 2. Pulmonary emphysema as before. 3. Dilated main pulmonary arteries up to 3.6 cm in diameter. Findings suggestive of pulmonary arterial hypertension. 4. Post TAVR. 5. Aortic atherosclerosis  PET scan 05/26/2022 Unchanged appearance of bilateral upper lobe non solid nodules when compared with the previous PET-CT from 01/27/2020. Corresponding SUV max is low level and nonspecific. Consider additional follow-up in 1 year toensure ongoing stability through at least 5 years.  CT Chest 05/02/2021 Unchanged mixed solid and ground-glass nodular consolidation of the posterior left upper lobe with tenting of the adjacent major fissure, measuring 2.7 x 1.0 cm. 2. Unchanged ground-glass opacity of the posterior right upper lobe abutting and tenting the major fissure, measuring approximately 1.8 cm. 3. Unchanged ground-glass opacity of the right pulmonary apex measuring approximately 1.1 cm. 4. These remain generally nonspecific, statistically most likely infectious or inflammatory, however indolent adenocarcinoma  is very difficult to exclude. Consider additional follow-up in 1 year to ensure ongoing stability  through at least 5 years  Super D CT Chest 09/2020 Multiple tiny solid and larger sub solid nodules, similar to the prior examinations, as detailed above. 2. Diffuse bronchial wall thickening with mild centrilobular and paraseptal emphysema; imaging findings suggestive of underlying COPD. 3. Aortic atherosclerosis, in addition to left anterior descending coronary artery disease. Please note that although the presence of coronary artery calcium documents the presence of coronary artery disease, the severity of this disease and any potential stenosis cannot be assessed on this non-gated CT examination. Assessment for potential risk factor modification, dietary therapy or pharmacologic therapy may be warranted, if clinically indicated. 4. Status post TAVR with CoreValve in place  Super D CT Chest 03/21/2022 Ground-glass nodules in the lungs bilaterally are stable from 12/31/2019 but appear more prominent than on 12/09/2018. Low-grade adenocarcinoma cannot be excluded. Follow-up CT chest without contrast in 1 year is recommended. 2. Clustered nodularity in the medial aspect of the left upper lobe, similar and likely infectious or inflammatory in etiology. This can be reassessed at 1 year follow-up   PET 01/27/2020 Ground-glass nodule in the left upper lobe does not show abnormal metabolism above blood pool. However, low-grade adenocarcinoma can be falsely negative on PET. Consider additional CT follow-up in 6-12 months, as clinically indicated. 2. Clustered nodularity in the medial aspect of the left upper lobe shows abnormal hypermetabolism. Appearance is unchanged from 01/14/2020 but nodularity is new from 06/02/2019. An infectious or inflammatory etiology is favored. Repeat CT chest without contrast in 4-6 weeks could be performed in further initial evaluation, as malignancy cannot be excluded. 3.  Aortic atherosclerosis (ICD10-I70.0). 4. Enlarged pulmonary arteries, indicative of  pulmonary arterial hypertension. 5.  Emphysema (ICD10-J43.9).  CT Chest 01/14/2020 Subsolid posterior left upper lobe 2.4 cm pulmonary nodule with associated distortion of the left major fissure, not appreciably changed since 06/02/2019 CT. Finding is indeterminate, with differential including primary bronchogenic adenocarcinoma or postinflammatory scarring. PET-CT could be considered for further characterization. Suggest multidisciplinary thoracic oncology consultation. 2. Scattered ground-glass pulmonary nodules in the right upper lobe, largest 1.8 cm, stable, warranting annual noncontrast chest CT follow-up.      Latest Ref Rng & Units 03/29/2022   11:04 AM 09/28/2021    9:37 AM 05/20/2021   11:46 AM  BMP  Glucose 70 - 99 mg/dL 86  87  106   BUN 6 - 23 mg/dL '14  17  16   '$ Creatinine 0.40 - 1.20 mg/dL 0.74  0.83  0.70   Sodium 135 - 145 mEq/L 140  142  137   Potassium 3.5 - 5.1 mEq/L 4.0  3.7  3.7   Chloride 96 - 112 mEq/L 99  103  100   CO2 19 - 32 mEq/L 32  33  28   Calcium 8.4 - 10.5 mg/dL 9.6  9.5  9.1     BNP    Component Value Date/Time   BNP 67.0 09/02/2020 1237   BNP 65.3 12/26/2018 1015    ProBNP No results found for: "PROBNP"  PFT    Component Value Date/Time   FEV1PRE 1.06 12/09/2018 1404   FEV1POST 1.07 12/09/2018 1404   FVCPRE 1.77 12/09/2018 1404   FVCPOST 2.17 12/09/2018 1404   TLC 5.43 12/09/2018 1404   DLCOUNC 8.82 12/09/2018 1404   PREFEV1FVCRT 60 12/09/2018 1404   PSTFEV1FVCRT 49 12/09/2018 1404    CT Super D Chest Wo Contrast  Result  Date: 06/15/2022 CLINICAL DATA:  Follow-up for lung nodules in a 81 year old female. EXAM: CT CHEST WITHOUT CONTRAST TECHNIQUE: Multidetector CT imaging of the chest was performed using thin slice collimation for electromagnetic bronchoscopy planning purposes, without intravenous contrast. RADIATION DOSE REDUCTION: This exam was performed according to the departmental dose-optimization program which includes  automated exposure control, adjustment of the mA and/or kV according to patient size and/or use of iterative reconstruction technique. COMPARISON:  May 02, 2021 FINDINGS: Cardiovascular: Post TAVR. No aneurysmal dilation of the thoracic aorta. Aortic atherosclerosis with calcification. Normal heart size without pericardial effusion or nodularity. Dilated main pulmonary arteries up to 3.6 cm with similar appearance to previous imaging. Limited assessment of cardiovascular structures given lack of intravenous contrast. Mediastinum/Nodes: No thoracic inlet, axillary, mediastinal or hilar adenopathy. Esophagus grossly normal. Lungs/Pleura: Airways are patent. No consolidation. No pleural effusion. Juxtapleural nodule LEFT upper lobe (image 13/4) 6 mm previously 6 mm. Fissural distortion and subsolid nodule in the LEFT upper lobe measuring 2.5 x 1.5 cm, density is similar to the prior exam. When measured in a similar fashion by this observer on the previous study maximal dimensions are unchanged. Airways are patent. Ground-glass nodules in the RIGHT chest (image 15/4) 9 mm in the RIGHT upper lobe within 1 mm of previous measurement. (Image 23/4) 17 mm along the major fissure in the posterior RIGHT upper lobe stable from previous imaging and without increased density. (Image 45/4) stable 4 mm RIGHT middle lobe nodule. Upper Abdomen: Imaged portions the liver, pancreas, spleen, adrenal glands and kidneys without acute process. Chronic appearing perinephric stranding seen bilaterally. Musculoskeletal: No acute musculoskeletal findings. IMPRESSION: 1. Unchanged appearance of bilateral upper lobe subsolid and ground-glass nodules. One year interval follow-up to complete 5 years total follow-up based on current recommendations may be helpful as these could represent small areas of indolent pulmonary neoplasm. 2. Pulmonary emphysema as before. 3. Dilated main pulmonary arteries up to 3.6 cm in diameter. Findings suggestive of  pulmonary arterial hypertension. 4. Post TAVR. 5. Aortic atherosclerosis. Aortic Atherosclerosis (ICD10-I70.0). Electronically Signed   By: Zetta Bills M.D.   On: 06/15/2022 09:05     Past medical hx Past Medical History:  Diagnosis Date   Atrial fibrillation ([place])    a. dx 03/2019 while in hospital with cholecystitis   COPD (chronic obstructive pulmonary disease) ([place])    Critical lower limb ischemia ([facility])    a. after TAVR developed ischemic L leg likely due to flap/closure, s/p emergent repair of left femoral artery with endarterectomy and Dacron patch angioplasty.   Emphysema lung (HCC)    Hyperlipidemia    LDL goal < 70   Hypertension    Hypomagnesemia    Normal coronary arteries 10/2018   NSVT (nonsustained ventricular tachycardia) ([place]) 03/02/2019   Pulmonary HTN ([place]) 06/05/2016   Moderate with PASP 5mHg by echo 04/2016 likely Group 3 from COPD and possibly Group 2 from pulmonary venous HTN associated with moderate AS - f/u echo 12/2018 post TAVR showed normal RVSP   Pulmonary nodules    seen on pre TAVR CT, needs 12 month CT follow up   S/P TAVR (transcatheter aortic valve replacement) 12/30/2018   Medtronic Evolut Pro-Plus THV (size 26 mm, serial # [id number] via the TF approach   Sacral fracture (HCC)    Severe aortic stenosis    a. s/p TAVR 12/2018.   Thoracic ascending aortic aneurysm ([facility]    needs yearly follow up     Social History   Tobacco Use  Smoking status: Former    Packs/day: 1.50    Years: 40.00    Total pack years: 60.00    Types: Cigarettes    Start date: 24    Quit date: 03/19/2004    Years since quitting: 18.3   Smokeless tobacco: Never   Tobacco comments:    Started smoking at age 51  Vaping Use   Vaping Use: Never used  Substance Use Topics   Alcohol use: Yes    Alcohol/week: 3.0 - 4.0 standard drinks of alcohol    Types: 3 - 4 Glasses of wine per week    Comment: socially   Drug use: No    DominiqueBigbee reports that she quit smoking  about 18 years ago. Her smoking use included cigarettes. She started smoking about 66 years ago. She has a 60.00 pack-year smoking history. She has never used smokeless tobacco. She reports current alcohol use of about 3.0 - 4.0 standard drinks of alcohol per week. She reports that she does not use drugs.  Tobacco Cessation: Former smoker , 60 pack year history quit 2005  Past surgical hx, Family hx, Social hx all reviewed.  Current Outpatient Medications on File Prior to Visit  Medication Sig   acetaminophen (TYLENOL) 325 MG tablet Take 2 tablets (650 mg total) by mouth every 6 (six) hours as needed for mild pain (or Fever >/= 101).   amLODipine (NORVASC) 5 MG tablet TAKE 1 TABLET (5 MG TOTAL) BY MOUTH DAILY.   amoxicillin (AMOXIL) 500 MG tablet Take 4 tablets (2,'000mg'$ ) one hour prior to all dental visits.   aspirin EC 81 MG tablet Take 81 mg by mouth daily.   Calcium Carb-Cholecalciferol (CALCIUM 600 + D PO) Take 600 mg by mouth 3 (three) times a week.    Cholecalciferol (VITAMIN D3) 5000 UNITS CAPS Take 5,000 Units by mouth daily.    denosumab (PROLIA) 60 MG/ML SOLN injection Inject 60 mg into the skin every 6 (six) months. Administer in upper arm, thigh, or abdomen   EPINEPHrine 0.3 mg/0.3 mL IJ SOAJ injection Inject 0.3 mg into the muscle as needed for anaphylaxis.   furosemide (LASIX) 20 MG tablet DAILY AS NEEDED FOR SWELLING   hydrochlorothiazide (MICROZIDE) 12.5 MG capsule TAKE 1 CAPSULE BY MOUTH EVERY DAY   loratadine (CLARITIN) 10 MG tablet Take 10 mg by mouth daily as needed for allergies.    magnesium oxide (MAG-OX) 400 (240 Mg) MG tablet Take 1 tablet (400 mg total) by mouth daily.   metoprolol succinate (TOPROL-XL) 25 MG 24 hr tablet TAKE 1 TABLET BY MOUTH EVERY DAY   Multiple Vitamin (MULTIVITAMIN WITH MINERALS) TABS tablet Take 1 tablet by mouth daily.   rosuvastatin (CRESTOR) 40 MG tablet TAKE 1 TABLET BY MOUTH EVERY DAY   umeclidinium bromide (INCRUSE ELLIPTA) 62.5 MCG/ACT  AEPB Inhale 1 puff into the lungs daily.   VENTOLIN HFA 108 (90 Base) MCG/ACT inhaler TAKE 2 PUFFS BY MOUTH EVERY 6 HOURS AS NEEDED FOR WHEEZE OR SHORTNESS OF BREATH   No current facility-administered medications on file prior to visit.     No Known Allergies  Review Of Systems:  Constitutional:   No  weight loss, night sweats,  Fevers, chills, fatigue, or  lassitude.  HEENT:   No headaches,  Difficulty swallowing,  Tooth/dental problems, or  Sore throat,                No sneezing, itching, ear ache, nasal congestion, post nasal drip,   CV:  No chest  pain,  Orthopnea, PND, swelling in lower extremities, anasarca, dizziness, palpitations, syncope.   GI  No heartburn, indigestion, abdominal pain, nausea, vomiting, diarrhea, change in bowel habits, loss of appetite, bloody stools.   Resp: No shortness of breath with exertion or at rest.  No excess mucus, no productive cough,  No non-productive cough,  No coughing up of blood.  No change in color of mucus.  No wheezing.  No chest wall deformity  Skin: no rash or lesions.  GU: no dysuria, change in color of urine, no urgency or frequency.  No flank pain, no hematuria   MS:  No joint pain or swelling.  No decreased range of motion.  No back pain.  Psych:  No change in mood or affect. No depression or anxiety.  No memory loss.   Vital Signs BP 128/70 (BP Location: Left Arm, Patient Position: Sitting, Cuff Size: Normal)   Pulse 71   Temp 98.4 F (36.9 C) (Oral)   Ht '5\' 6"'$  (1.676 m)   Wt 158 lb 12.8 oz (72 kg)   SpO2 93%   BMI 25.63 kg/m    Physical Exam:  General- No distress,  A&Ox3, pleasant ENT: No sinus tenderness, TM clear, pale nasal mucosa, no oral exudate,no post nasal drip, no LAN Cardiac: S1, S2, regular rate and rhythm, no murmur Chest: No wheeze/ rales/ dullness; no accessory muscle use, no nasal flaring, no sternal retractions Abd.: Soft Non-tender, ND, BS +, Body mass index is 25.63 kg/m.  Ext: No clubbing  cyanosis, edema Neuro:  normal strength, MAE x 4, A&O x 3 Skin: No rashes, warm and dry Psych: normal mood and behavior   Assessment/Plan Poorly formed left upper lobe and groundglass right upper lobe nodules Currently undergoing surveillance monitoring annually Plan We will do a 6 month follow up low dose Super D CT Chest.  If the areas of concern are larger we will consider a PET scan at that time.  Continue Incruse 1 puff once daily Rinse mouth after use We will send in another prescription for your Ventolin Use 1-2 puffs as needed for breakthrough shortness of breath or wheezing.  Follow up with Dr. Valeta Harms or Judson Roch NP after follow up CT Chest in 6 months.  We can consider PFT's in future if dyspnea worsens Please contact office for sooner follow up if symptoms do not improve or worsen or seek emergency care   I spent 35 minutes dedicated to the care of this patient on the date of this encounter to include pre-visit review of records, face-to-face time with the patient discussing conditions above, post visit ordering of testing, clinical documentation with the electronic health record, making appropriate referrals as documented, and communicating necessary information to the patient's healthcare team.    Magdalen Spatz, NP 07/06/2022  9:39 AM

## 2022-07-06 NOTE — Patient Instructions (Addendum)
It is good to see you today. We will do a 6 month follow up low dose Super D CT Chest.  If the areas of concern are larger we will consider a PET scan at that time.  Continue Incruse 1 puff once daily Rinse mouth after use We will send in another prescription for your Ventolin Use 1-2 puffs as needed for breakthrough shortness of breath or wheezing.  Follow up with Dr. Valeta Harms or Judson Roch NP after follow up CT Chest in 6 months.  We can consider PFT's in future if dyspnea worsens Please contact office for sooner follow up if symptoms do not improve or worsen or seek emergency care

## 2022-07-09 ENCOUNTER — Encounter (HOSPITAL_BASED_OUTPATIENT_CLINIC_OR_DEPARTMENT_OTHER): Payer: Self-pay | Admitting: Family

## 2022-07-09 ENCOUNTER — Ambulatory Visit (INDEPENDENT_AMBULATORY_CARE_PROVIDER_SITE_OTHER): Payer: Medicare Other | Admitting: Family

## 2022-07-09 ENCOUNTER — Telehealth: Payer: Self-pay | Admitting: Acute Care

## 2022-07-09 VITALS — BP 124/52 | HR 64 | Ht 66.0 in | Wt 159.4 lb

## 2022-07-09 DIAGNOSIS — R600 Localized edema: Secondary | ICD-10-CM

## 2022-07-09 DIAGNOSIS — I350 Nonrheumatic aortic (valve) stenosis: Secondary | ICD-10-CM | POA: Diagnosis not present

## 2022-07-09 DIAGNOSIS — I6529 Occlusion and stenosis of unspecified carotid artery: Secondary | ICD-10-CM | POA: Diagnosis not present

## 2022-07-09 DIAGNOSIS — Z952 Presence of prosthetic heart valve: Secondary | ICD-10-CM

## 2022-07-09 DIAGNOSIS — E042 Nontoxic multinodular goiter: Secondary | ICD-10-CM | POA: Diagnosis not present

## 2022-07-09 DIAGNOSIS — J441 Chronic obstructive pulmonary disease with (acute) exacerbation: Secondary | ICD-10-CM

## 2022-07-09 MED ORDER — ALBUTEROL SULFATE HFA 108 (90 BASE) MCG/ACT IN AERS: 2.0000 | INHALATION_SPRAY | Freq: Four times a day (QID) | RESPIRATORY_TRACT | 3 refills | Status: AC | PRN

## 2022-07-09 MED ORDER — MAGNESIUM OXIDE -MG SUPPLEMENT 400 (240 MG) MG PO TABS
400.0000 mg | ORAL_TABLET | Freq: Every day | ORAL | 3 refills | Status: DC
Start: 2022-07-09 — End: 2023-10-01

## 2022-07-09 NOTE — Patient Instructions (Addendum)
Medication Instructions:  Continue your current medications.   *If you need a refill on your cardiac medications before your next appointment, please call your pharmacy*   Lab Work: Your physician recommends that you return for lab work today: magnesium level  If you have labs (blood work) drawn today and your tests are completely normal, you will receive your results only by: [place] (if you have MyChart) OR A paper copy in the mail If you have any lab test that is abnormal or we need to change your treatment, we will call you to review the results.   Testing/Procedures: Your physician has requested that you have an echocardiogram 09/2022. Echocardiography is a painless test that uses sound waves to create images of your heart. It provides your doctor with information about the size and shape of your heart and how well your heart's chambers and valves are working. This procedure takes approximately one hour. There are no restrictions for this procedure.    Follow-Up: At [facility], you and your health needs are our priority.  As part of our continuing mission to provide you with exceptional heart care, we have created designated Provider Care Teams.  These Care Teams include your primary Cardiologist (physician) and Advanced Practice Providers (APPs -  Physician Assistants and Nurse Practitioners) who all work together to provide you with the care you need, when you need it.  We recommend signing up for the patient portal called "MyChart".  Sign up information is provided on this After Visit Summary.  MyChart is used to connect with patients for Virtual Visits (Telemedicine).  Patients are able to view lab/test results, encounter notes, upcoming appointments, etc.  Non-urgent messages can be sent to your provider as well.   To learn more about what you can do with MyChart, go to [email or web address].    Your next appointment:   6 month(s)  The format for your next  appointment:   In Person  Provider:   [name], MD    Other Instructions  We will send a note to Vascular Surgery you are overdue for follow up. If you don't hear from them in one week you can call them at  [phone]   ____________________  Heart Healthy Diet Recommendations: A low-salt diet is recommended. Meats should be grilled, baked, or boiled. Avoid fried foods. Focus on lean protein sources like fish or chicken with vegetables and fruits. The American Heart Association is a Microbiologist!  American Heart Association Diet and Lifeystyle Recommendations .  Exercise recommendations: The American Heart Association recommends 150 minutes of moderate intensity exercise weekly. Try 30 minutes of moderate intensity exercise 4-5 times per week. This could include walking, jogging, or swimming.

## 2022-07-09 NOTE — Telephone Encounter (Signed)
ATC patient. LVMTCB. 

## 2022-07-09 NOTE — Telephone Encounter (Signed)
Patient states pharmacy does not have Ventolin script, per SG notes on 9/8, "We will send in another prescription for your Ventolin Use 1-2 puffs as needed for breakthrough shortness of breath or wheezing"  Please send to [facility] on [address].  Please call patient when script is called in.

## 2022-07-09 NOTE — Progress Notes (Signed)
Office Visit    Patient Name: Dominique [name] Date of Encounter: 07/09/2022  PCP:  Colon Branch, [place]  Cardiologist:  [name], MD  Advanced Practice Provider:  No care team member to display Electrophysiologist:  None      Chief Complaint    Dominique [name] is a 81 y.o. female presents today for aortic stenosis follow up.    Past Medical History    Past Medical History:  Diagnosis Date   Atrial fibrillation ([place])    a. dx 03/2019 while in hospital with cholecystitis   COPD (chronic obstructive pulmonary disease) ([place])    Critical lower limb ischemia (HCC)    a. after TAVR developed ischemic L leg likely due to flap/closure, s/p emergent repair of left femoral artery with endarterectomy and Dacron patch angioplasty.   Emphysema lung (HCC)    Hyperlipidemia    LDL goal < 70   Hypertension    Hypomagnesemia    Normal coronary arteries 10/2018   NSVT (nonsustained ventricular tachycardia) ([place]) 03/02/2019   Pulmonary HTN ([place]) 06/05/2016   Moderate with PASP 4mHg by echo 04/2016 likely Group 3 from COPD and possibly Group 2 from pulmonary venous HTN associated with moderate AS - f/u echo 12/2018 post TAVR showed normal RVSP   Pulmonary nodules    seen on pre TAVR CT, needs 12 month CT follow up   S/P TAVR (transcatheter aortic valve replacement) 12/30/2018   Medtronic Evolut Pro-Plus THV (size 26 mm, serial # [id number] via the TF approach   Sacral fracture (HCC)    Severe aortic stenosis    a. s/p TAVR 12/2018.   Thoracic ascending aortic aneurysm [place]    needs yearly follow up   Past Surgical History:  Procedure Laterality Date   [place] 04/28/2019   Procedure: LAPAROSCOPIC CHOLECYSTECTOMY;  Surgeon: BStark Klein MD;  Location: [facility];  Service: General;  Laterality: N/A;   COLONOSCOPY  2018   FEMORAL-POPLITEAL BYPASS GRAFT Left 12/31/2018    Procedure: PATCH ANGIOPLASTY REPAIR LEFT FEMORAL ARTERY USING [place]  Surgeon: ERosetta Posner MD;  [facility]: [facility]  Service: Vascular;  Laterality: Left;   IR RADIOLOGY PERIPHERAL GUIDED IV START  05/30/2017   IR RADIOLOGY PERIPHERAL GUIDED IV START  09/02/2018   IR RADIOLOGY PERIPHERAL GUIDED IV START  09/02/2018   IR [place]ASC ACCESS LEFT  09/02/2018   IR [place] VASC ACCESS RIGHT  05/30/2017   IR [place] VASC ACCESS RIGHT  09/02/2018   JOINT REPLACEMENT  2016   sacroplasty   LAMINECTOMY     RIGHT/LEFT HEART CATH AND CORONARY ANGIOGRAPHY N/A 11/27/2018   Procedure: RIGHT/LEFT HEART CATH AND CORONARY ANGIOGRAPHY;  Surgeon: CSherren Mocha MD;  [facility];  Service: Cardiovascular;  Laterality: N/A;   TEE WITHOUT CARDIOVERSION N/A 12/30/2018   Procedure: TRANSESOPHAGEAL ECHOCARDIOGRAM (TEE);  Surgeon: CSherren Mocha MD;  [facility];  Service: Open Heart Surgery;  Laterality: N/A;   TOTAL HIP ARTHROPLASTY Bilateral    TRANSCATHETER AORTIC VALVE REPLACEMENT, TRANSFEMORAL N/A 12/30/2018   Procedure: TRANSCATHETER AORTIC VALVE REPLACEMENT, TRANSFEMORAL;  Surgeon: CSherren Mocha MD;  [facility];  Service: Open Heart Surgery;  Laterality: N/A;   TUBAL LIGATION  1975    Allergies  No Known Allergies  History  of Present Illness    Dominique [name] is a 81 y.o. female with a hx of nonobstructive coronary artery disease, lower extremity edema, hyperlipidemia, hypertension, SVT, thyroid nodule, aortic valve stenosis s/p TAVR 12/2018, carotid artery stenosis last seen 10/02/2021.  Prior nonobstructive CAD on coronary CTA 2018 in 2019.  Cardiac catheterization January 2020 with normal coronary anatomy.  She underwent TAVR 01/16/2019.  Preprocedure cardiac catheterization with normal coronary arteries.  Postoperatively she had severe acute anemia and underwent emergent repair of left femoral artery with endarterectomy and  dacryon patch angioplasty.  Postoperatively noted to have atrial fibrillation and was placed on Toprol 25 mg daily.  Did not require anticoagulation as was limited episode of atrial fibrillation on cardiac monitor.  Carotid Dopplers 05/2019 with right ICA 60 to 79% stenosis of left carotid 1 to 39% stenosis.  Referred to vascular surgery.  Ultrasound showed multiple bilateral thyroid nodules which were recommended for surveillance.  Most recent echo 09/2021 LVEF 60 to 46%, grade 1 diastolic dysfunction, RV normal size and function, mildly elevated PASP, trivial MR, aortic valve with trivial AI mean gradient 7.7 mmHg.  No significant change from prior.  She presents today for follow-up. Reports no chest pain, pressure, tightness. Notes exertional dyspnea which is worsening. Recently saw pulmonology and was provided Ventolin for breakthrough shortness of breath, but has not yet picked up. Noted over the last few weeks in setting of walking around more as she was vacationing in [place]. No orthopnea, PND. Notes LE edema when she was in [place] which she attributes to the heat. Her HCTZ does help her swelling a lot. She has PRN lasix which she uses periodically with good response. Couple episodes of lightheadedness and one episode of reported syncope occurred 2-3 months ago.  No vision changes. Encouraged to discuss her overdue follow up with vascular surgery for monitoring of carotid stenosis.   EKGs/Labs/Other Studies Reviewed:   The following studies were reviewed today:  Echo 09/2021  1. Left ventricular ejection fraction, by estimation, is 60 to 65%. Left  ventricular ejection fraction by 3D volume is 66 %. The left ventricle has  normal function. The left ventricle has no regional wall motion  abnormalities. Left ventricular diastolic   parameters are consistent with Grade I diastolic dysfunction (impaired  relaxation).   2. Right ventricular systolic function is normal. The right  ventricular  size is normal. There is mildly elevated pulmonary artery systolic  pressure.   3. The mitral valve is normal in structure. Trivial mitral valve  regurgitation. No evidence of mitral stenosis.   4. The aortic valve has been repaired/replaced. Aortic valve  regurgitation is trivial. Aortic valve mean gradient measures 7.7 mmHg.  Aortic valve Vmax measures 1.94 m/s.   Comparison(s): No significant change from prior study. EF 65% GLS -19.3%,  Medtronic Evolut Pro-Plus THV (size 26 mm), trivial AI, mean gradient  75mHg, peak gradient 138mg, asc aor 3881m  EKG:  EKG is ordered today.  The ekg ordered today demonstrates NSR 62 bpm with no acute ST/T wave changes.   Recent Labs: 09/28/2021: ALT 22 03/29/2022: BUN 14; Creatinine, Ser 0.74; Hemoglobin 14.0; Platelets 239.0; Potassium 4.0; Sodium 140  Recent Lipid Panel    Component Value Date/Time   CHOL 140 09/28/2021 0937   CHOL 139 05/28/2019 1740   TRIG 87.0 09/28/2021 0937   HDL 64.90 09/28/2021 0937   HDL 54 05/28/2019 1740   CHOLHDL 2 09/28/2021 0937   VLDL 17.4 09/28/2021 0937  [place] 57 09/28/2021 0937   LDLCALC 59 05/28/2019 1740   Home Medications   No outpatient medications have been marked as taking for the 07/09/22 encounter (Office Visit) with Loel Dubonnet, NP.     Review of Systems      All other systems reviewed and are otherwise negative except as noted above.  Physical Exam    VS:  Pulse 64   Ht '5\' 6"'$  (1.676 m)   Wt 159 lb 6.4 oz (72.3 kg)   SpO2 98%   BMI 25.73 kg/m  , BMI Body mass index is 25.73 kg/m.  Wt Readings from Last 3 Encounters:  07/09/22 159 lb 6.4 oz (72.3 kg)  07/06/22 158 lb 12.8 oz (72 kg)  03/29/22 163 lb 2 oz (74 kg)    GEN: Well nourished, well developed, in no acute distress. HEENT: normal. Neck: Supple, no JVD, carotid bruits, or masses. Cardiac: RRR, no rubs, or gallops. Gr 2 systolic murmur. No clubbing, cyanosis, edema.  Radials/PT 2+ and equal bilaterally.   Respiratory:  Respirations regular and unlabored, clear to auscultation bilaterally. GI: Soft, nontender, nondistended. MS: No deformity or atrophy. Skin: Warm and dry, no rash. Neuro:  Strength and sensation are intact. Psych: Normal affect.  Assessment & Plan    Aortic stenosis s/p TAVR-echo 09/2021 normal LVEF, mild AI. Repeat echo 09/2022. Continue SBE prophylaxis amoxicillin. Continue optimal BP control.   Lower extremity edema- Controlled on HCTZ and PRN Lasix. Encouraged leg elevation, compressions stockings.   CAD-nonobstructive CAD on coronary CTA 2018 in 2018.  Card catheterization January 2020 with normal coronary anatomy  Hypomagnesia - On supplementation. Refill provided. Update magnesium level.   Hyperlipidemia/carotid artery stenosis-CT angio neck 02/2021 with right ICA 60 to 70% stenosis, left ICA without hemodynamically significant stenosis.Due for follow up with [facility]. Encouraged her to call them to schedule and provider her the phone number.   Hypertension- BP well controlled. Continue current antihypertensive regimen.    Paroxysmal SVT- Palpitations quiescent on metoprolol.   Thyroid nodules- Continue to follow with PCP.   Lung nodules - Follows with pulmonology. Repeat CT upcoming.     Disposition: Follow up in 6 month(s) with [name], MD or APP.  Signed, Loel Dubonnet, NP 07/09/2022, 1:38 PM [facility]

## 2022-07-09 NOTE — Telephone Encounter (Signed)
Rx for pt's ventolin inhaler has been sent to pharmacy for pt. Called and spoke with pt letting her know this had been done and she verbalized understanding. Nothing further needed.

## 2022-07-10 LAB — MAGNESIUM: Magnesium: 2.2 mg/dL (ref 1.6–2.3)

## 2022-07-10 NOTE — Addendum Note (Signed)
Addended by: Gerald Stabs on: 07/10/2022 07:34 AM   Modules accepted: Orders

## 2022-07-10 NOTE — Progress Notes (Signed)
Results were reviewed with patient at last office visit with SG, NP.  Thanks,  BLI  Garner Nash, DO [facility] 07/10/2022 2:11 PM

## 2022-07-16 ENCOUNTER — Other Ambulatory Visit: Payer: Medicare Other

## 2022-07-17 ENCOUNTER — Ambulatory Visit (HOSPITAL_BASED_OUTPATIENT_CLINIC_OR_DEPARTMENT_OTHER)
Admission: RE | Admit: 2022-07-17 | Discharge: 2022-07-17 | Disposition: A | Discharge status: Home / Self Care | Payer: Medicare Other | Source: Ambulatory Visit | Attending: Acute Care | Admitting: Acute Care

## 2022-07-17 DIAGNOSIS — R918 Other nonspecific abnormal finding of lung field: Secondary | ICD-10-CM | POA: Insufficient documentation

## 2022-07-17 DIAGNOSIS — J432 Centrilobular emphysema: Secondary | ICD-10-CM | POA: Diagnosis not present

## 2022-07-19 DIAGNOSIS — Z124 Encounter for screening for malignant neoplasm of cervix: Secondary | ICD-10-CM | POA: Diagnosis not present

## 2022-07-19 DIAGNOSIS — Z1231 Encounter for screening mammogram for malignant neoplasm of breast: Secondary | ICD-10-CM | POA: Diagnosis not present

## 2022-07-19 DIAGNOSIS — Z6827 Body mass index (BMI) 27.0-27.9, adult: Secondary | ICD-10-CM | POA: Diagnosis not present

## 2022-07-27 ENCOUNTER — Telehealth: Payer: Self-pay | Admitting: Cardiovascular Disease

## 2022-07-27 NOTE — Telephone Encounter (Signed)
Pt is requesting call back in regards to results and to discuss test taken. Please advise.

## 2022-07-30 ENCOUNTER — Telehealth: Payer: Self-pay | Admitting: Acute Care

## 2022-07-30 DIAGNOSIS — R918 Other nonspecific abnormal finding of lung field: Secondary | ICD-10-CM

## 2022-07-30 NOTE — Telephone Encounter (Signed)
Called and spoke with patient. Patient stated she needs CT results.  SG, please advise.

## 2022-08-01 ENCOUNTER — Other Ambulatory Visit: Payer: Self-pay | Admitting: Acute Care

## 2022-08-01 NOTE — Telephone Encounter (Signed)
Super D order placed for 6 months to reevaluate lung nodules.  Patient aware.  Nothing further at this time.

## 2022-08-01 NOTE — Telephone Encounter (Signed)
I have called the patient with the results of her CT Chest. I was confused as to why she had a second CT Chest within a month of her previous scan, as it was ordered as a 6 month follow up. I explained that the scan was unchanged from her previous imaging. We will need to do a 6 month follow up 12/2022 to re-evaluate the nodules as was ordered in the 07/06/2022 OV.    Stable appearance of the posterior left upper lobe area of architectural distortion/irregular sub solid nodularity. Multiple ground-glass nodules identified in the right lung are stable in the interval. Status post TAVR. Similar enlargement of the main pulmonary arteries compatible with pulmonary arterial hypertension. Stable 3.9 cm diameter ascending thoracic aorta. Aortic Atherosclerosis (ICD10-I70.0) and Emphysema (ICD10-J43.9).   Triage please place order for 6 month follow up Super D CT Chest due 12/2022. Thanks so much

## 2022-08-04 DIAGNOSIS — Z23 Encounter for immunization: Secondary | ICD-10-CM | POA: Diagnosis not present

## 2022-08-04 NOTE — Telephone Encounter (Signed)
Prolia VOB initiated via [email or web address]  Last Prolia inj 03/20/22 Next Prolia inj due 09/21/22

## 2022-08-20 ENCOUNTER — Ambulatory Visit (INDEPENDENT_AMBULATORY_CARE_PROVIDER_SITE_OTHER): Payer: Medicare Other | Admitting: *Deleted

## 2022-08-20 DIAGNOSIS — Z0000 Encounter for general adult medical examination without abnormal findings: Secondary | ICD-10-CM

## 2022-08-20 DIAGNOSIS — L578 Other skin changes due to chronic exposure to nonionizing radiation: Secondary | ICD-10-CM | POA: Diagnosis not present

## 2022-08-20 DIAGNOSIS — L814 Other melanin hyperpigmentation: Secondary | ICD-10-CM | POA: Diagnosis not present

## 2022-08-20 DIAGNOSIS — D225 Melanocytic nevi of trunk: Secondary | ICD-10-CM | POA: Diagnosis not present

## 2022-08-20 DIAGNOSIS — L309 Dermatitis, unspecified: Secondary | ICD-10-CM | POA: Diagnosis not present

## 2022-08-20 DIAGNOSIS — L821 Other seborrheic keratosis: Secondary | ICD-10-CM | POA: Diagnosis not present

## 2022-08-20 NOTE — Patient Instructions (Signed)
Dominique [name] , Thank you for taking time to come for your Medicare Wellness Visit. I appreciate your ongoing commitment to your health goals. Please review the following plan we discussed and let me know if I can assist you in the future.   These are the goals we discussed:  Goals      Chronic Care Management Pharmacy Care Plan     CARE PLAN ENTRY (see longitudinal plan of care for additional care plan information)  Current Barriers:  Chronic Disease Management support, education, and care coordination needs related to Hypertension, Hyperlipidemia/CAD, COPD, Osteoporosis   Hypertension / Lower extremity swelling BP Readings from Last 3 Encounters:  03/21/21 (!) 148/72  12/27/20 128/72  11/08/20 (!) 116/50  Pharmacist Clinical Goal(s): Over the next 90 days, patient will work with PharmD and providers to maintain BP goal 110-135 / 65-84 (per Dr Larose Kells) Current regimen:  Amlodipine '5mg'$  daily in morning (Decreased due to swelling by cardiology office on 03/21/21) Metoprolol Succinate '25mg'$  daily Hydrochlorothiazide 12.'5mg'$  daily in morning (started 03/21/21) Furosemide '20mg'$  daily if needed for swelling Interventions: Discussed blood pressure goal Discussed recent medication changes.  Patient self care activities - Over the next 180 days, patient will: Maintain hypertension medication regimen.  Recommend restart checking blood pressure daily for the next 2 to 3 weeks due make sure recent medication changes keeps blood pressure at goal mentioned above.   Hyperlipidemia / heart disease Lab Results  Component Value Date/Time   LDLCALC 56 12/27/2020 11:09 AM   LDLCALC 59 05/28/2019 05:40 PM  Pharmacist Clinical Goal(s): Over the next 90 days, patient will work with PharmD and providers to  LDL goal < 70 Current regimen:  Aspirin '81mg'$  daily Rosuvastatin '40mg'$  daily Magnesium Oxide '400mg'$  daily Patient self care activities - Over the next 180 days, patient will: Maintain cholesterol  medication regimen.  Consider rechecking magnesium level with next labs.   COPD Pharmacist Clinical Goal(s) Over the next 90 days, patient will work with PharmD and providers to reduce symptoms associated with COPD Current regimen:  Incruse Ellipta 70.26mg inhale 1 puff daily (maintenance inhaler) Ventolin HFA as needed (rescue inhaler) Interventions: Discussed differences between Ventolin and Incruse Ellipta Ventolin = Rescue Inhaler = Use as needed for shortness of breath Incruse Ellipta = Maintenance Inhaler = Use daily to prevent lung function decline Patient self care activities - Over the next 180 days, patient will: Maintain COPD Medication regimen Contact office if you reach Medicare Coverage Gap (cost of Incruse copay increases)   Osteoporosis:  Pharmacist Clinical Goal(s) Over the next 90 days, patient will work with PharmD and providers to prevent fractures and maintain bone density Current regimen:  Prolia '60mg'$  administered in office every 6 months (last 01/24/2021) Calcium + D daily  Vitamin D 500IU daily  Interventions: Ensure '1200mg'$  of calcium intake per day from supplementation and diet Ensure 800 to 1000 units per day of vitamin D (more if you have had low vitamin D levels in past)  Patient self care activities - Over the next 180 days, patient will: Maintain current regimen for bone health  Continue to walk daily Fall prevention  Medication management Pharmacist Clinical Goal(s): Over the next 180 days, patient will work with PharmD and providers to maintain optimal medication adherence Current pharmacy: [facility] Interventions Comprehensive medication review performed. Continue current medication management strategy Patient self care activities - Over the next 180 days, patient will: Focus on medication adherence by filling and taking medications appropriately  Take medications as prescribed Report  any questions or concerns to PharmD and/or provider(s)  Please  see past updates related to this goal by clicking on the "Past Updates" button in the selected goal       DIET - INCREASE WATER INTAKE     Increase physical activity        This is a list of the screening recommended for you and due dates:  Health Maintenance  Topic Date Due   COVID-19 Vaccine (6 - Moderna risk series) 09/29/2022   Tetanus Vaccine  04/24/2026   Pneumonia Vaccine  Completed   Flu Shot  Completed   DEXA scan (bone density measurement)  Completed   Zoster (Shingles) Vaccine  Completed   HPV Vaccine  Aged Out     Next appointment: Follow up in one year for your annual wellness visit    Preventive Care 65 Years and Older, Female Preventive care refers to lifestyle choices and visits with your health care provider that can promote health and wellness. What does preventive care include? A yearly physical exam. This is also called an annual well check. Dental exams once or twice a year. Routine eye exams. Ask your health care provider how often you should have your eyes checked. Personal lifestyle choices, including: Daily care of your teeth and gums. Regular physical activity. Eating a healthy diet. Avoiding tobacco and drug use. Limiting alcohol use. Practicing safe sex. Taking low-dose aspirin every day. Taking vitamin and mineral supplements as recommended by your health care provider. What happens during an annual well check? The services and screenings done by your health care provider during your annual well check will depend on your age, overall health, lifestyle risk factors, and family history of disease. Counseling  Your health care provider may ask you questions about your: Alcohol use. Tobacco use. Drug use. Emotional well-being. Home and relationship well-being. Sexual activity. Eating habits. History of falls. Memory and ability to understand (cognition). Work and work Statistician. Reproductive health. Screening  You may have the following  tests or measurements: Height, weight, and BMI. Blood pressure. Lipid and cholesterol levels. These may be checked every 5 years, or more frequently if you are over 13 years old. Skin check. Lung cancer screening. You may have this screening every year starting at age 77 if you have a 30-pack-year history of smoking and currently smoke or have quit within the past 15 years. Fecal occult blood test (FOBT) of the stool. You may have this test every year starting at age 36. Flexible sigmoidoscopy or colonoscopy. You may have a sigmoidoscopy every 5 years or a colonoscopy every 10 years starting at age 92. Hepatitis C blood test. Hepatitis B blood test. Sexually transmitted disease (STD) testing. Diabetes screening. This is done by checking your blood sugar (glucose) after you have not eaten for a while (fasting). You may have this done every 1-3 years. Bone density scan. This is done to screen for osteoporosis. You may have this done starting at age 6. Mammogram. This may be done every 1-2 years. Talk to your health care provider about how often you should have regular mammograms. Talk with your health care provider about your test results, treatment options, and if necessary, the need for more tests. Vaccines  Your health care provider may recommend certain vaccines, such as: Influenza vaccine. This is recommended every year. Tetanus, diphtheria, and acellular pertussis (Tdap, Td) vaccine. You may need a Td booster every 10 years. Zoster vaccine. You may need this after age 53. Pneumococcal 13-valent conjugate (  PCV13) vaccine. One dose is recommended after age 53. Pneumococcal polysaccharide (PPSV23) vaccine. One dose is recommended after age 66. Talk to your health care provider about which screenings and vaccines you need and how often you need them. This information is not intended to replace advice given to you by your health care provider. Make sure you discuss any questions you have with  your health care provider. Document Released: 11/11/2015 Document Revised: 07/04/2016 Document Reviewed: 08/16/2015 Elsevier Interactive Patient Education  2017 [place] Prevention in the Home Falls can cause injuries. They can happen to people of all ages. There are many things you can do to make your home safe and to help prevent falls. What can I do on the outside of my home? Regularly fix the edges of walkways and driveways and fix any cracks. Remove anything that might make you trip as you walk through a door, such as a raised step or threshold. Trim any bushes or trees on the path to your home. Use bright outdoor lighting. Clear any walking paths of anything that might make someone trip, such as rocks or tools. Regularly check to see if handrails are loose or broken. Make sure that both sides of any steps have handrails. Any raised decks and porches should have guardrails on the edges. Have any leaves, snow, or ice cleared regularly. Use sand or salt on walking paths during winter. Clean up any spills in your garage right away. This includes oil or grease spills. What can I do in the bathroom? Use night lights. Install grab bars by the toilet and in the tub and shower. Do not use towel bars as grab bars. Use non-skid mats or decals in the tub or shower. If you need to sit down in the shower, use a plastic, non-slip stool. Keep the floor dry. Clean up any water that spills on the floor as soon as it happens. Remove soap buildup in the tub or shower regularly. Attach bath mats securely with double-sided non-slip rug tape. Do not have throw rugs and other things on the floor that can make you trip. What can I do in the bedroom? Use night lights. Make sure that you have a light by your bed that is easy to reach. Do not use any sheets or blankets that are too big for your bed. They should not hang down onto the floor. Have a firm chair that has side arms. You can use this  for support while you get dressed. Do not have throw rugs and other things on the floor that can make you trip. What can I do in the kitchen? Clean up any spills right away. Avoid walking on wet floors. Keep items that you use a lot in easy-to-reach places. If you need to reach something above you, use a strong step stool that has a grab bar. Keep electrical cords out of the way. Do not use floor polish or wax that makes floors slippery. If you must use wax, use non-skid floor wax. Do not have throw rugs and other things on the floor that can make you trip. What can I do with my stairs? Do not leave any items on the stairs. Make sure that there are handrails on both sides of the stairs and use them. Fix handrails that are broken or loose. Make sure that handrails are as long as the stairways. Check any carpeting to make sure that it is firmly attached to the stairs. Fix any carpet that is  loose or worn. Avoid having throw rugs at the top or bottom of the stairs. If you do have throw rugs, attach them to the floor with carpet tape. Make sure that you have a light switch at the top of the stairs and the bottom of the stairs. If you do not have them, ask someone to add them for you. What else can I do to help prevent falls? Wear shoes that: Do not have high heels. Have rubber bottoms. Are comfortable and fit you well. Are closed at the toe. Do not wear sandals. If you use a stepladder: Make sure that it is fully opened. Do not climb a closed stepladder. Make sure that both sides of the stepladder are locked into place. Ask someone to hold it for you, if possible. Clearly mark and make sure that you can see: Any grab bars or handrails. First and last steps. Where the edge of each step is. Use tools that help you move around (mobility aids) if they are needed. These include: Canes. Walkers. Scooters. Crutches. Turn on the lights when you go into a dark area. Replace any light bulbs as  soon as they burn out. Set up your furniture so you have a clear path. Avoid moving your furniture around. If any of your floors are uneven, fix them. If there are any pets around you, be aware of where they are. Review your medicines with your doctor. Some medicines can make you feel dizzy. This can increase your chance of falling. Ask your doctor what other things that you can do to help prevent falls. This information is not intended to replace advice given to you by your health care provider. Make sure you discuss any questions you have with your health care provider. Document Released: 08/11/2009 Document Revised: 03/22/2016 Document Reviewed: 11/19/2014 Elsevier Interactive Patient Education  2017 [employer].

## 2022-08-20 NOTE — Progress Notes (Addendum)
Subjective:   Dominique [name] is a 81 y.o. female who presents for Medicare Annual (Subsequent) preventive examination.  I connected with  Dominique [name] on 08/20/22 by a audio enabled telemedicine application and verified that I am speaking with the correct person using two identifiers.  Patient Location: Home  Provider Location: Office/Clinic  I discussed the limitations of evaluation and management by telemedicine. The patient expressed understanding and agreed to proceed.   Review of Systems    Defer to PCP Cardiac Risk Factors include: advanced age (>62mn, >>53women);dyslipidemia;hypertension     Objective:    There were no vitals filed for this visit. There is no height or weight on file to calculate BMI.     08/20/2022   11:42 AM 08/17/2021   11:02 AM 05/20/2021   10:47 AM 06/27/2020    9:28 AM 06/08/2019    2:27 PM 06/02/2019   11:25 AM 05/25/2019   11:02 AM  Advanced Directives  Does Patient Have a Medical Advance Directive? Yes Yes Yes Yes Yes Yes Yes  Type of AParamedicof ADresbachLiving will HGrant TownLiving will HGrovelandLiving will HLake CityLiving will Living will;Healthcare Power of AGeneva-on-the-LakeLiving will  Does patient want to make changes to medical advance directive? No - Patient declined   No - Patient declined   No - Patient declined  Copy of HMercerin Chart? Yes - validated most recent copy scanned in chart (See row information) Yes - validated most recent copy scanned in chart (See row information) No - copy requested No - copy requested   Yes - validated most recent copy scanned in chart (See row information)    Current Medications (verified) Outpatient Encounter Medications as of 08/20/2022  Medication Sig   acetaminophen (TYLENOL) 325 MG tablet Take 2 tablets (650 mg total) by mouth every 6 (six) hours as needed for mild  pain (or Fever >/= 101).   albuterol (VENTOLIN HFA) 108 (90 Base) MCG/ACT inhaler Inhale 2 puffs into the lungs every 6 (six) hours as needed for wheezing or shortness of breath.   amLODipine (NORVASC) 5 MG tablet TAKE 1 TABLET (5 MG TOTAL) BY MOUTH DAILY.   amoxicillin (AMOXIL) 500 MG tablet Take 4 tablets (2,'000mg'$ ) one hour prior to all dental visits.   aspirin EC 81 MG tablet Take 81 mg by mouth daily.   Calcium Carb-Cholecalciferol (CALCIUM 600 + D PO) Take 600 mg by mouth 3 (three) times a week.    Cholecalciferol (VITAMIN D3) 5000 UNITS CAPS Take 5,000 Units by mouth daily.    denosumab (PROLIA) 60 MG/ML SOLN injection Inject 60 mg into the skin every 6 (six) months. Administer in upper arm, thigh, or abdomen   EPINEPHrine 0.3 mg/0.3 mL IJ SOAJ injection Inject 0.3 mg into the muscle as needed for anaphylaxis.   furosemide (LASIX) 20 MG tablet DAILY AS NEEDED FOR SWELLING   hydrochlorothiazide (MICROZIDE) 12.5 MG capsule TAKE 1 CAPSULE BY MOUTH EVERY DAY   loratadine (CLARITIN) 10 MG tablet Take 10 mg by mouth daily as needed for allergies.    magnesium oxide (MAG-OX) 400 (240 Mg) MG tablet Take 1 tablet (400 mg total) by mouth daily.   metoprolol succinate (TOPROL-XL) 25 MG 24 hr tablet TAKE 1 TABLET BY MOUTH EVERY DAY   Multiple Vitamin (MULTIVITAMIN WITH MINERALS) TABS tablet Take 1 tablet by mouth daily.   rosuvastatin (CRESTOR) 40 MG tablet  TAKE 1 TABLET BY MOUTH EVERY DAY   umeclidinium bromide (INCRUSE ELLIPTA) 62.5 MCG/ACT AEPB Inhale 1 puff into the lungs daily.   No facility-administered encounter medications on file as of 08/20/2022.    Allergies (verified) Patient has no known allergies.   History: Past Medical History:  Diagnosis Date   Atrial fibrillation ([place])    a. dx 03/2019 while in hospital with cholecystitis   COPD (chronic obstructive pulmonary disease) ([place])    Critical lower limb ischemia (HCC)    a. after TAVR developed ischemic L leg likely due to  flap/closure, s/p emergent repair of left femoral artery with endarterectomy and Dacron patch angioplasty.   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 s/p TAVR 12/2018.   Thoracic ascending aortic aneurysm ([place]ast [place]    needs yearly follow up   Past Surgical History:  Procedure Laterality Date   [place]villeN/A 04/28/2019   Procedure: LAPAROSCOPIC CHOLECYSTECTOMY;  Surgeon: BStark Klein MD;  [facility]: [facility];  Service: General;  Laterality: N/A;   COLONOSCOPY  2018   FEMORAL-POPLITEAL BYPASS GRAFT Left 12/31/2018   Procedure: PATCH ANGIOPLASTY REPAIR LEFT FEMORAL ARTERY USING HWit[place]  Surgeon: ERosetta Posner MD;  [facility]  Service: Vascular;  Laterality: Left;   IR RADIOLOGY PERIPHERAL GUIDED IV START  05/30/2017   IR RADIOLOGY PERIPHERAL GUIDED IV START  09/02/2018   IR RADIOLOGY PERIPHERAL GUIDED IV START  09/02/2018   IR [place] VASC ACCESS LEFT  09/02/2018   IR [place] VASC ACCESS RIGHT  05/30/2017   IR [place] VASC ACCESS RIGHT  09/02/2018   JOINT REPLACEMENT  2016   sacroplasty   LAMINECTOMY     RIGHT/LEFT HEART CATH AND CORONARY ANGIOGRAPHY N/A 11/27/2018   Procedure: RIGHT/LEFT HEART CATH AND CORONARY ANGIOGRAPHY;  Surgeon: CSherren Mocha  MD;  [facility];  Service: Cardiovascular;  Laterality: N/A;   TEE WITHOUT CARDIOVERSION N/A 12/30/2018   Procedure: TRANSESOPHAGEAL ECHOCARDIOGRAM (TEE);  Surgeon: CSherren Mocha MD;  [facility];  Service: Open Heart Surgery;  Laterality: N/A;   TOTAL HIP ARTHROPLASTY Bilateral    TRANSCATHETER AORTIC VALVE REPLACEMENT, TRANSFEMORAL N/A 12/30/2018   Procedure: TRANSCATHETER AORTIC VALVE REPLACEMENT, TRANSFEMORAL;  Surgeon: CSherren Mocha MD;  [facility];  Service: Open Heart Surgery;  Laterality: N/A;   TUBAL LIGATION  1975   Family History  Problem Relation Age of Onset   Breast cancer Mother        breast   Diabetes Son        type 1   Cancer Brother  lung   Colon cancer Neg Hx    CAD Neg Hx    Colon polyps Neg Hx    Rectal cancer Neg Hx    Stomach cancer Neg Hx    Social History   Socioeconomic History   Marital status: Widowed    Spouse name: Not on file   Number of children: 2   Years of education: Not on file   Highest education level: Not on file  Occupational History   Occupation: n/a  Tobacco Use   Smoking status: Former    Packs/day: 1.50    Years: 40.00    Total pack years: 60.00    Types: Cigarettes    Start date: 7    Quit date: 03/19/2004    Years since quitting: 18.4   Smokeless tobacco: Never   Tobacco comments:    Started smoking at age 78  Vaping Use   Vaping Use: Never used  Substance and Sexual Activity   Alcohol use: Yes    Alcohol/week: 3.0 - 4.0 standard drinks of alcohol    Types: 3 - 4 Glasses of wine per week    Comment: socially   Drug use: No   Sexual activity: Not on file  Other Topics Concern   Not on file  Social History Narrative   From [place]   Lost a son to DM   1 living son   Social Determinants of Health   Financial Resource Strain: Low Risk  (03/22/2021)   Overall Financial Resource Strain (CARDIA)    Difficulty of Paying Living Expenses: Not very hard   Food Insecurity: No Food Insecurity (08/17/2021)   Hunger Vital Sign    Worried About Running Out of Food in the Last Year: Never true    [place] in the Last Year: Never true  Transportation Needs: No Transportation Needs (08/17/2021)   PRAPARE - Hydrologist (Medical): No    Lack of Transportation (Non-Medical): No  Physical Activity: Insufficiently Active (03/22/2021)   Exercise Vital Sign    Days of Exercise per Week: 7 days    Minutes of Exercise per Session: 20 min  Stress: No Stress Concern Present (08/17/2021)   [place]    Feeling of Stress : Not at all  Social Connections: Moderately Isolated (08/17/2021)   Social Connection and Isolation Panel [NHANES]    Frequency of Communication with Friends and Family: More than three times a week    Frequency of Social Gatherings with Friends and Family: More than three times a week    Attends Religious Services: Never    Marine scientist or Organizations: Yes    Attends Music therapist: More than 4 times per year    Marital Status: Widowed    Tobacco Counseling Counseling given: Not Answered Tobacco comments: Started smoking at age 89   Clinical Intake:  Pre-visit preparation completed: Yes  Pain : No/denies pain  How often do you need to have someone help you when you read instructions, pamphlets, or other written materials from your doctor or pharmacy?: 1 - Never  Diabetic? No  Activities of Daily Living    08/20/2022   11:43 AM  In your present state of health, do you have any difficulty performing the following activities:  Hearing? 1  Comment slight difficulty  Vision? 0  Difficulty concentrating or making decisions? 1  Comment slight difficulty with memory  Walking or  climbing stairs? 0  Dressing or bathing? 0  Doing errands, shopping? 0  Preparing Food and eating ? N  Using the  Toilet? N  In the past six months, have you accidently leaked urine? N  Do you have problems with loss of bowel control? N  Managing your Medications? N  Managing your Finances? N  Housekeeping or managing your Housekeeping? N    Patient Care Team: Colon Branch, MD as PCP - General (Internal Medicine) [name], MD as PCP - Cardiology (Cardiology) Dr Lindley [facility] as Consulting Physician [facility]) [facility] (Orthopedic Surgery) Linda Hedges, DO as Consulting Physician (Obstetrics and Gynecology) Cherre Robins, RPH-CPP (Pharmacist)  Indicate any recent Medical Services you may have received from other than [facility] providers in the past year (date may be approximate).     Assessment:   This is a routine wellness examination for Legacie.  Hearing/Vision screen No results found.  Dietary issues and exercise activities discussed: Current Exercise Habits: The patient does not participate in regular exercise at present, Exercise limited by: None identified   Goals Addressed   None    Depression Screen    08/20/2022   11:43 AM 03/29/2022   10:40 AM 09/28/2021    9:12 AM 08/17/2021   11:06 AM 04/28/2021   11:09 AM 12/27/2020   10:52 AM 06/27/2020    9:35 AM  PHQ 2/9 Scores  PHQ - 2 Score 0 0 0 0 5 0 0  PHQ- 9 Score     9      Fall Risk    08/20/2022   11:42 AM 03/29/2022   10:40 AM 09/28/2021    9:12 AM 08/17/2021   11:04 AM 04/28/2021   10:14 AM  Fall Risk   Falls in the past year? 0 0 0 0 0  Number falls in past yr: 0 0 0 0 0  Injury with Fall? 0 0 0 0 0  Risk for fall due to : No Fall Risks      Follow up Falls evaluation completed Falls evaluation completed Falls evaluation completed Falls prevention discussed Falls evaluation completed    [place]:  Any stairs in or around the home? No  If so, are there any without handrails?  No stairs Home free of loose throw rugs in walkways, pet beds,  electrical cords, etc? Yes  Adequate lighting in your home to reduce risk of falls? Yes   ASSISTIVE DEVICES UTILIZED TO PREVENT FALLS:  Life alert? No  Use of a cane, walker or w/c? No  Grab bars in the bathroom? No  Shower chair or bench in shower? Yes  Elevated toilet seat or a handicapped toilet? Yes   TIMED UP AND GO:  Was the test performed?  Audio visit .    Cognitive Function:        08/20/2022   11:50 AM  6CIT Screen  What Year? 0 points  What month? 0 points  What time? 0 points  Count back from 20 2 points  Months in reverse 2 points  Repeat phrase 2 points  Total Score 6 points    Immunizations Immunization History  Administered Date(s) Administered   Fluad Quad(high Dose 65+) 07/14/2019, 08/23/2020   Influenza, High Dose Seasonal PF 10/03/2016, 08/07/2018, 08/04/2022   Influenza-Unspecified 09/06/2015, 07/18/2017, 08/03/2021   Moderna Sars-Covid-2 Vaccination 12/01/2019, 12/31/2019, 07/05/2020   PFIZER Comirnaty(Gray Top)Covid-19 Tri-Sucrose Vaccine 08/04/2022   Pfizer Covid-19 Vaccine Bivalent Booster 95yr &  up 08/03/2021   Pneumococcal Conjugate-13 09/27/2014   Pneumococcal Polysaccharide-23 10/29/2010, 12/27/2020, 09/28/2021   Td 04/24/2016   Zoster Recombinat (Shingrix) 10/18/2017, 03/04/2018    TDAP status: Up to date  Flu Vaccine status: Up to date  Pneumococcal vaccine status: Up to date  Covid-19 vaccine status: Completed vaccines  Qualifies for Shingles Vaccine? Yes   Zostavax completed No   Shingrix Completed?: Yes  Screening Tests Health Maintenance  Topic Date Due   COVID-19 Vaccine (6 - Moderna risk series) 09/29/2022   TETANUS/TDAP  04/24/2026   Pneumonia Vaccine 58+ Years old  Completed   INFLUENZA VACCINE  Completed   DEXA SCAN  Completed   Zoster Vaccines- Shingrix  Completed   HPV VACCINES  Aged Out    Health Maintenance  There are no preventive care reminders to display for this patient.  Colorectal cancer  screening: No longer required.   Mammogram status: Completed 07/20/22. Repeat every year  Lung Cancer Screening: (Low Dose CT Chest recommended if Age 58-80 years, 30 pack-year currently smoking OR have quit w/in 15years.) does not qualify.   Lung Cancer Screening Referral: N/a  Additional Screening:  Hepatitis C Screening: does not qualify; C  Vision Screening: Recommended annual ophthalmology exams for early detection of glaucoma and other disorders of the eye. Is the patient up to date with their annual eye exam?  Yes  Who is the provider or what is the name of the office in which the patient attends annual eye exams? [facility]. If pt is not established with a provider, would they like to be referred to a provider to establish care? No .   Dental Screening: Recommended annual dental exams for proper oral hygiene  Community Resource Referral / Chronic Care Management: CRR required this visit?  No   CCM required this visit?  No      Plan:     I have personally reviewed and noted the following in the patient's chart:   Medical and social history Use of alcohol, tobacco or illicit drugs  Current medications and supplements including opioid prescriptions. Patient is not currently taking opioid prescriptions. Functional ability and status Nutritional status Physical activity Advanced directives List of other physicians Hospitalizations, surgeries, and ER visits in previous 12 months Vitals Screenings to include cognitive, depression, and falls Referrals and appointments  In addition, I have reviewed and discussed with patient certain preventive protocols, quality metrics, and best practice recommendations. A written personalized care plan for preventive services as well as general preventive health recommendations were provided to patient.   Due to this being a telephonic visit, the after visit summary with patients personalized plan was offered to patient via mail or  my-chart. Patient would like to access on my-chart.  Beatris Ship, [place]   08/20/2022   Nurse Notes: None    I have reviewed and agree with [facility] documentation.  Kathlene November, MD

## 2022-08-27 DIAGNOSIS — H1045 Other chronic allergic conjunctivitis: Secondary | ICD-10-CM | POA: Diagnosis not present

## 2022-08-27 DIAGNOSIS — H31091 Other chorioretinal scars, right eye: Secondary | ICD-10-CM | POA: Diagnosis not present

## 2022-08-27 DIAGNOSIS — H26493 Other secondary cataract, bilateral: Secondary | ICD-10-CM | POA: Diagnosis not present

## 2022-08-27 DIAGNOSIS — Z961 Presence of intraocular lens: Secondary | ICD-10-CM | POA: Diagnosis not present

## 2022-08-28 ENCOUNTER — Other Ambulatory Visit (HOSPITAL_COMMUNITY): Payer: Self-pay

## 2022-08-28 NOTE — Telephone Encounter (Signed)
Last Prolia injection 03/20/2022

## 2022-08-28 NOTE — Telephone Encounter (Signed)
Pharmacy Patient Advocate Encounter  Insurance verification completed.    The patient is insured through [employer] test claims for: Prolia '60mg'$ .  Pharmacy benefit copay: $642.09

## 2022-08-30 NOTE — Telephone Encounter (Signed)
Pt ready for scheduling on or after 09/21/22   Out-of-pocket cost due at time of visit: $0.00   Primary: Medicare Prolia co-insurance: 20% (approximately $276) Admin fee co-insurance: 20% (approximately $25)   Secondary: [employer] Supp Prolia co-insurance: Covers Medicare Part B co-insurance Admin fee co-insurance: Covers Medicare Part B co-insurance   Deductible:  Covered by secondary     Prior Auth: not required PA# Valid:    ** This summary of benefits is an estimation of the patient's out-of-pocket cost. Exact cost may vary based on individual plan coverage.

## 2022-08-31 NOTE — Telephone Encounter (Signed)
Pt scheduled  

## 2022-09-13 ENCOUNTER — Other Ambulatory Visit: Payer: Self-pay | Admitting: Cardiology

## 2022-09-13 NOTE — Telephone Encounter (Signed)
Rx request sent to pharmacy.  

## 2022-09-17 NOTE — Telephone Encounter (Signed)
Appt 09/25/22

## 2022-09-25 ENCOUNTER — Ambulatory Visit: Payer: Medicare Other

## 2022-09-25 NOTE — Progress Notes (Deleted)
Patient here for Prolia injection per Dr. Larose Kells.  Injection given in left arm and patient tolerated well.

## 2022-09-26 ENCOUNTER — Ambulatory Visit (INDEPENDENT_AMBULATORY_CARE_PROVIDER_SITE_OTHER): Payer: Medicare Other

## 2022-09-26 DIAGNOSIS — M810 Age-related osteoporosis without current pathological fracture: Secondary | ICD-10-CM

## 2022-09-26 MED ORDER — DENOSUMAB 60 MG/ML ~~LOC~~ SOSY
60.0000 mg | PREFILLED_SYRINGE | Freq: Once | SUBCUTANEOUS | Status: AC
  Administered 2022-09-26: 60 mg via SUBCUTANEOUS

## 2022-09-26 NOTE — Progress Notes (Signed)
Pt here for Prolia injection per Dr. Larose Kells. Pt states she has not had any side effects from prior shots. Shot given in right arm. Patient handled well.

## 2022-09-28 ENCOUNTER — Ambulatory Visit: Payer: Medicare Other | Admitting: Internal Medicine

## 2022-10-08 ENCOUNTER — Ambulatory Visit (INDEPENDENT_AMBULATORY_CARE_PROVIDER_SITE_OTHER): Payer: Medicare Other

## 2022-10-08 DIAGNOSIS — I350 Nonrheumatic aortic (valve) stenosis: Secondary | ICD-10-CM

## 2022-10-08 LAB — ECHOCARDIOGRAM COMPLETE
AR max vel: 2.41 cm2
AV Area VTI: 2.49 cm2
AV Area mean vel: 2.35 cm2
AV Mean grad: 4 mmHg
AV Peak grad: 7.6 mmHg
Ao pk vel: 1.38 m/s
Area-P 1/2: 2.95 cm2
MV M vel: 3.88 m/s
MV Peak grad: 60.2 mmHg
S' Lateral: 2.4 cm

## 2022-10-17 ENCOUNTER — Encounter: Payer: Self-pay | Admitting: Internal Medicine

## 2022-10-17 ENCOUNTER — Ambulatory Visit (INDEPENDENT_AMBULATORY_CARE_PROVIDER_SITE_OTHER): Payer: Medicare Other | Admitting: Internal Medicine

## 2022-10-17 VITALS — BP 126/80 | HR 66 | Temp 98.0°F | Resp 20 | Ht 66.0 in | Wt 158.4 lb

## 2022-10-17 DIAGNOSIS — J9611 Chronic respiratory failure with hypoxia: Secondary | ICD-10-CM | POA: Diagnosis not present

## 2022-10-17 DIAGNOSIS — R0902 Hypoxemia: Secondary | ICD-10-CM

## 2022-10-17 DIAGNOSIS — E7800 Pure hypercholesterolemia, unspecified: Secondary | ICD-10-CM | POA: Diagnosis not present

## 2022-10-17 DIAGNOSIS — I6523 Occlusion and stenosis of bilateral carotid arteries: Secondary | ICD-10-CM

## 2022-10-17 DIAGNOSIS — I10 Essential (primary) hypertension: Secondary | ICD-10-CM

## 2022-10-17 DIAGNOSIS — R739 Hyperglycemia, unspecified: Secondary | ICD-10-CM | POA: Diagnosis not present

## 2022-10-17 DIAGNOSIS — J449 Chronic obstructive pulmonary disease, unspecified: Secondary | ICD-10-CM

## 2022-10-17 LAB — BASIC METABOLIC PANEL
BUN: 17 mg/dL (ref 6–23)
CO2: 32 mEq/L (ref 19–32)
Calcium: 9.4 mg/dL (ref 8.4–10.5)
Chloride: 99 mEq/L (ref 96–112)
Creatinine, Ser: 0.77 mg/dL (ref 0.40–1.20)
GFR: 71.81 mL/min (ref >60.00)
Glucose, Bld: 87 mg/dL (ref 70–99)
Potassium: 3.9 mEq/L (ref 3.5–5.1)
Sodium: 141 mEq/L (ref 135–145)

## 2022-10-17 LAB — HEMOGLOBIN A1C: Hgb A1c MFr Bld: 6.1 % (ref 4.6–6.5)

## 2022-10-17 LAB — CBC WITH DIFFERENTIAL/PLATELET
Basophils Absolute: 0.1 10*3/uL (ref 0.0–0.1)
Basophils Relative: 1 % (ref 0.0–3.0)
Eosinophils Absolute: 0.5 10*3/uL (ref 0.0–0.7)
Eosinophils Relative: 5.7 % — ABNORMAL HIGH (ref 0.0–5.0)
HCT: 42.3 % (ref 36.0–46.0)
Hemoglobin: 14.5 g/dL (ref 12.0–15.0)
Lymphocytes Relative: 20.7 % (ref 12.0–46.0)
Lymphs Abs: 1.8 10*3/uL (ref 0.7–4.0)
MCHC: 34.2 g/dL (ref 30.0–36.0)
MCV: 92.6 fl (ref 78.0–100.0)
Monocytes Absolute: 0.7 10*3/uL (ref 0.1–1.0)
Monocytes Relative: 8.4 % (ref 3.0–12.0)
Neutro Abs: 5.6 10*3/uL (ref 1.4–7.7)
Neutrophils Relative %: 64.2 % (ref 43.0–77.0)
Platelets: 299 10*3/uL (ref 150.0–400.0)
RBC: 4.57 Mil/uL (ref 3.87–5.11)
RDW: 14 % (ref 11.5–15.5)
WBC: 8.7 10*3/uL (ref 4.0–10.5)

## 2022-10-17 LAB — LIPID PANEL
Cholesterol: 150 mg/dL (ref 0–200)
HDL: 67.8 mg/dL (ref >39.00)
LDL Cholesterol: 67 mg/dL (ref 0–99)
NonHDL: 82.61
Total CHOL/HDL Ratio: 2
Triglycerides: 79 mg/dL (ref 0.0–149.0)
VLDL: 15.8 mg/dL (ref 0.0–40.0)

## 2022-10-17 LAB — ALT: ALT: 20 U/L (ref 0–35)

## 2022-10-17 LAB — AST: AST: 29 U/L (ref 0–37)

## 2022-10-17 NOTE — Progress Notes (Unsigned)
Subjective:    Patient ID: Dominique [name], female    DOB: 1941-08-16, 81 y.o.   MRN: [id number]  DOS:  10/17/2022 Type of visit - description: f/u  Since the last office visit he is doing okay. O2 sat was low upon arrival.  She admits to some DOE and a mild cough.  No sputum production, some wheezing. No chest pain, no lower extremity edema.  Occasional palpitations. No recent fever or chills. No recent URI. Also, reports some HOH, no tinnitus  Review of Systems See above   Past Medical History:  Diagnosis Date   Atrial fibrillation ([place])    a. dx 03/2019 while in hospital with cholecystitis   COPD (chronic obstructive pulmonary disease) ([place])    Critical lower limb ischemia (HCC)    a. after TAVR developed ischemic L leg likely due to flap/closure, s/p emergent repair of left femoral artery with endarterectomy and Dacron patch angioplasty.   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 s/p TAVR 12/2018.   Thoracic ascending aortic aneurysm ([place]    needs yearly follow up    Past Surgical History:  Procedure Laterality Date   AS[place]/A 04/28/2019   Procedure: LAPAROSCOPIC CHOLECYSTECTOMY;  Surgeon: BStark Klein MD;  [facility];  Service: General;   Laterality: N/A;   COLONOSCOPY  2018   FEMORAL-POPLITEAL BYPASS GRAFT Left 12/31/2018   Procedure: PATCH ANGIOPLASTY REPAIR LEFT FEMORAL ARTERY USING [place]  Surgeon: ERosetta Posner MD;  [facility]  Service: Vascular;  Laterality: Left;   IR RADIOLOGY PERIPHERAL GUIDED IV START  05/30/2017   IR RADIOLOGY PERIPHERAL GUIDED IV START  09/02/2018   IR RADIOLOGY PERIPHERAL GUIDED IV START  09/02/2018   IR [place]IDE VASC ACCESS LEFT  09/02/2018   IR [place]IDE VASC ACCESS RIGHT  05/30/2017   IR [place]IDE VASC ACCESS RIGHT  09/02/2018   JOINT REPLACEMENT  2016   sacroplasty   LAMINECTOMY     RIGHT/LEFT HEART CATH AND CORONARY ANGIOGRAPHY N/A 11/27/2018   Procedure: RIGHT/LEFT HEART CATH AND CORONARY ANGIOGRAPHY;  Surgeon: CSherren Mocha MD;  [facility];  Service: Cardiovascular;  Laterality: N/A;   TEE WITHOUT CARDIOVERSION N/A 12/30/2018   Procedure: TRANSESOPHAGEAL ECHOCARDIOGRAM (TEE);  Surgeon: CSherren Mocha MD;  [facility];  Service: Open Heart Surgery;  Laterality: N/A;   TOTAL HIP ARTHROPLASTY Bilateral    TRANSCATHETER AORTIC VALVE REPLACEMENT, TRANSFEMORAL N/A 12/30/2018   Procedure: TRANSCATHETER AORTIC VALVE REPLACEMENT, TRANSFEMORAL;  Surgeon: CSherren Mocha MD;  [facility];  Service: Open Heart Surgery;  Laterality: N/A;  TUBAL LIGATION  1975    Current Outpatient Medications  Medication Instructions   acetaminophen (TYLENOL) 650 mg, Oral, Every 6 hours PRN   albuterol (VENTOLIN HFA) 108 (90 Base) MCG/ACT inhaler 2 puffs, Inhalation, Every 6 hours PRN   amLODipine (NORVASC) 5 mg, Oral, Daily   amoxicillin (AMOXIL) 500 MG tablet Take 4 tablets (2,'000mg'$ ) one hour prior to all dental visits.   aspirin EC 81 mg, Daily   Calcium Carb-Cholecalciferol (CALCIUM 600 + D PO) 600 mg, Oral, 3 times weekly   denosumab (PROLIA) 60 mg, Subcutaneous, Every 6 months, Administer in upper arm, thigh, or abdomen     EPINEPHrine (EPI-PEN) 0.3 mg, As needed   furosemide (LASIX) 20 MG tablet DAILY AS NEEDED FOR SWELLING   hydrochlorothiazide (MICROZIDE) 12.5 MG capsule TAKE 1 CAPSULE BY MOUTH EVERY DAY   loratadine (CLARITIN) 10 mg, Oral, Daily PRN   magnesium oxide (MAG-OX) 400 mg, Oral, Daily   metoprolol succinate (TOPROL-XL) 25 MG 24 hr tablet TAKE 1 TABLET BY MOUTH EVERY DAY   Multiple Vitamin (MULTIVITAMIN WITH MINERALS) TABS tablet 1 tablet, Oral, Daily   rosuvastatin (CRESTOR) 40 mg, Oral, Daily   umeclidinium bromide (INCRUSE ELLIPTA) 62.5 MCG/ACT AEPB 1 puff, Inhalation, Daily   Vitamin D3 5,000 Units, Oral, Daily       Objective:   Physical Exam BP 126/80   Pulse 66   Temp 98 F (36.7 C) (Oral)   Resp 20   Ht '5\' 6"'$  (1.676 m)   Wt 158 lb 6 oz (71.8 kg)   SpO2 (!) 88% Comment: with walking  BMI 25.56 kg/m  General:   Well developed, NAD, BMI noted. HEENT:  Normocephalic . Face symmetric, atraumatic Lungs:  decreased breath sounds bilaterally, slightly increased expiratory time. Normal respiratory effort, no intercostal retractions, no accessory muscle use. Heart: RRR,  no murmur.  Lower extremities: no pretibial edema bilaterally  Skin: Not pale. Not jaundice Neurologic:  alert & oriented X3.  Speech normal, gait appropriate for age and unassisted Psych--  Cognition and judgment appear intact.  Cooperative with normal attention span and concentration.  Behavior appropriate. No anxious or depressed appearing.      Assessment    Assessment Hyperglycemia HTN Hyperlipidemia PULM: -Emphysema, quit tobacco 2006 -CT chest angio  05-2016 ----pulm nodule x2,reticular infiltrate ; f/u CT 11/2016 stable nodule, no f/u ----enlarged R P.A. and Ao -Abnormal PET scan 12-2019 CV: -DOE: (-) myoview  8-017, CT angio chest 05-2016:  no PE  -S/p TVAR 12/2018 -Paroxysmal A. fib DX 6/28/ 2020 in the context of acute cholecystitis, converted to NSR, not anticoagulated as of  05/25/2019 --Nonobstructive CAD (per coronary CTA) -Aspirin indefinitely, SBE prophylaxis with amoxicillin -MSK --DJD --Sacral Fx, sacral insufficiency, s/p sacroplasty (radiology) 07-2015 --H/o osteoporosis :  took fosamax while in [place], Fosamax rx again by ortho after sacral Fx 07-2015, never took it, first Prolia 11-14-2015 --T score 08-2015 >> normal (likely normal d/t DJD?) H/o vit D def: Normal level 09-2015   PLAN  ROV Hyperglycemia: Check A1c HTN: Reports normal ambulatory BPs, continue amlodipine, Lasix, HCTZ, metoprolol.  Check BMP and CBC. High cholesterol: On Crestor, check a FLP AST ALT Hypoxemia: Patient was noted to be hypoxic upon arrival, O2 sat increased to 94% with rest however after a 3-minute walk test he dropped to 88%.  Rec O2 therapy 24/7.  She is reluctant to get a concentrator for outside of the home use.  She agreed to use get oxygen for home, day and  night.  (++DOE at nighttime when she gets up to go to the bathroom).  Will let pulmonary know. Pulmonary nodules: Last visit with pulmonary July 06, 2022, as CT chest was done, next 12-2022. P-A-fib.  S/p TEVAR: Saw cardiology 07/09/2022, echo done 10/08/2022.  Meds no change. Preventive care: Had a flu shot and a COVID vaccine, recommend RSV. RTC 4 months

## 2022-10-17 NOTE — Patient Instructions (Signed)
Check the  blood pressure regularly BP GOAL is between 110/65 and  135/85. If it is consistently higher or lower, let me know    GO TO THE FRONT DESK, PLEASE SCHEDULE YOUR APPOINTMENTS Come back for checkup in 4 months

## 2022-10-17 NOTE — Progress Notes (Unsigned)
SATURATION QUALIFICATIONS: (This note is used to comply with regulatory documentation for home oxygen)  Patient Saturations on Room Air at Rest = 94%  Patient Saturations on Room Air while Ambulating = 88%  Patient Saturations on 2 Liters of oxygen while Ambulating = 90%  Please briefly explain why patient needs home oxygen: 

## 2022-10-18 NOTE — Assessment & Plan Note (Signed)
ROV Hyperglycemia: Check A1c HTN: Reports normal ambulatory BPs, continue amlodipine, Lasix, HCTZ, metoprolol.  Check BMP and CBC. High cholesterol: On Crestor, check a FLP AST ALT Hypoxemia: Patient was noted to be hypoxic upon arrival, O2 sat increased to 94% with rest however after a 3-minute walk test he dropped to 88%.  Rec O2 therapy 24/7.  She is reluctant to get a concentrator for outside of the home use.  She agreed to use get oxygen for home, day and night.  (++DOE at nighttime when she gets up to go to the bathroom).  Will let pulmonary know. Pulmonary nodules: Last visit with pulmonary July 06, 2022, as CT chest was done, next 12-2022. P-A-fib.  S/p TEVAR: Saw cardiology 07/09/2022, echo done 10/08/2022.  Meds no change. Preventive care: Had a flu shot and a COVID vaccine, recommend RSV. RTC 4 months

## 2022-11-08 ENCOUNTER — Other Ambulatory Visit: Payer: Self-pay | Admitting: Internal Medicine

## 2022-11-08 ENCOUNTER — Encounter (HOSPITAL_BASED_OUTPATIENT_CLINIC_OR_DEPARTMENT_OTHER): Payer: Self-pay | Admitting: Cardiovascular Disease

## 2022-11-08 ENCOUNTER — Ambulatory Visit (INDEPENDENT_AMBULATORY_CARE_PROVIDER_SITE_OTHER): Payer: Medicare Other | Admitting: Cardiovascular Disease

## 2022-11-08 VITALS — BP 152/68 | HR 64 | Ht 66.0 in | Wt 160.4 lb

## 2022-11-08 DIAGNOSIS — Z952 Presence of prosthetic heart valve: Secondary | ICD-10-CM | POA: Diagnosis not present

## 2022-11-08 DIAGNOSIS — E042 Nontoxic multinodular goiter: Secondary | ICD-10-CM | POA: Diagnosis not present

## 2022-11-08 DIAGNOSIS — J449 Chronic obstructive pulmonary disease, unspecified: Secondary | ICD-10-CM

## 2022-11-08 DIAGNOSIS — I10 Essential (primary) hypertension: Secondary | ICD-10-CM

## 2022-11-08 DIAGNOSIS — E7800 Pure hypercholesterolemia, unspecified: Secondary | ICD-10-CM | POA: Diagnosis not present

## 2022-11-08 DIAGNOSIS — I7121 Aneurysm of the ascending aorta, without rupture: Secondary | ICD-10-CM

## 2022-11-08 DIAGNOSIS — I6523 Occlusion and stenosis of bilateral carotid arteries: Secondary | ICD-10-CM | POA: Diagnosis not present

## 2022-11-08 DIAGNOSIS — E041 Nontoxic single thyroid nodule: Secondary | ICD-10-CM | POA: Diagnosis not present

## 2022-11-08 HISTORY — DX: Nontoxic single thyroid nodule: E04.1

## 2022-11-08 NOTE — Assessment & Plan Note (Signed)
Continue rosuvastatin.  Lipids are well-controlled.

## 2022-11-08 NOTE — Assessment & Plan Note (Signed)
Thyroid nodules noted on prior imaging.  She is due for repeat.  We will order it now.

## 2022-11-08 NOTE — Assessment & Plan Note (Signed)
Moderate R carotid stenosis.  Mild L carotid study.  She follows with vascular.  Continue aspirin and rosuvastatin.

## 2022-11-08 NOTE — Assessment & Plan Note (Signed)
Dr. Larose Kells recommended 24/7 O2.  She has not been using oxygen at home.  She notes that her O2 saturation has been >88%.  In the office today she was initially hypoxic but improved with rest.  She attributes this to rushing.  Recommended that she continue to monitor sats closely and f/u with Dr. Larose Kells and her pulmonary team.

## 2022-11-08 NOTE — Assessment & Plan Note (Signed)
Stable 3.9 cm ascending aorta aneurysm on chest CT 06/2022.  BP well-controlled.  She is on metoprolol.

## 2022-11-08 NOTE — Progress Notes (Signed)
Cardiology Office Note   Date:  11/08/2022   ID:  [name], DOB 12/16/1940, MRN [id number]  PCP:  Colon Branch, MD  Cardiologist:   Fransico Him, MD  No chief complaint on file.  History of Present Illness: Dominique [name] is a 82 y.o. female with severe aortic stenosis s/p TAVR, non-obstructive CAD, mild carotid stenosis, hyperlipidemia, moderate TR, mild pulmonary hypertension, hypertension, and severe COPD here for follow-up. [name] saw Dr. Radford Pax on 05/2017.  She was noted to have moderate aortic stenosis (peak velocity 3.1 m/s, mean gradient 21 mmHg).  She had a chest CT 11/2016 that showed coronary calcifications.  She subsequently had a coronary CT-A 05/2017 that revealed less than 50% plaque in the proximal LAD and a mild ascending aortic aneurysm (3.9 cm).  She was noted to have calcification of the aorta and aortic valve.  Echo 05/31/17 that revealed LVEF 60-65% with normal diastolic function.  She had a repeat echo 05/2018 that showed her mean aortic valve gradient increased to 36 mmHg.  She continued to report chest discomfort so she had a repeat coronary CT-A 08/2018 that revealed 25-49% stenosis of the proximal LAD unchanged from prior.    She reported increasing exertional dyspnea and chest discomfort.  She was referred to Dr. [name] and underwent TAVR 26 mm Medtronic CoreValve Evolut Pro on 12/2018.  She had a cardiac catheterization prior to TAVR that showed normal coronary arteries.    Postoperative echo revealed a mean gradient of 6 mmHg.  However this procedure was complicated by an ischemic left leg.  She went to the OR emergently with Dr. [name] for repair of the left femoral artery with endarterectomy and dacryon patch angioplasty.   [name] required a laparoscopic cholecystectomy 03/2019.  Her hospitalization was complicated by atrial fibrillation.  This was thought to be due to her acute illness and she was not started on long-term anticoagulation.  She saw Dr. Larose Kells  05/2019 and reported symptoms concerning for TIA.  She had bilateral carotid Dopplers that showed 60 to 79% right ICA stenosis and 1 to 39% stenosis on the left.  She was referred to neurology and had an MRI of the brain 06/30/2019 that showed chronic small vessel changes but no acute findings.  Repeat carotid Dopplers 02/2020 revealed mild stenosis bilaterally.  [name] had heart racing after her third COVID-19 vaccination 06/2020.  She wore a heart monitor that showed several runs of SVT.  The longest episode was 7 beats.    She saw Sande Rives, [place] 02/2021 and was taking lasix intermittently for edema. Her blood pressure was elevated so amlodipine was reduced and she was started on HCTZ. She followed up with Coletta Memos, NP and her symptoms had improved. She last saw [name], NP 06/2022 and overall was doing well. She continues to follow with vascular for her carotid stenosis.   Today, she is overall doing well. She was recently prescribed oxygen by pulmonary and had concerns about her dosage and frequency of use. She reports she has trouble breathing from her nose due to her sinus, and tends to . She has been testing her oxygen in the morning and at night and reports that her oxygen levels tend to be within normal range.  She has been monitoring her blood pressure at home about once a week. She notes that her readings average in the 130/60s.  Since Thanksgiving she has been having trouble staying active. She reports having lost a  few pounds within the last month. She denies any palpitations, chest pain, shortness of breath, or peripheral edema. No lightheadedness, headaches, syncope, orthopnea, or PND.   Past Medical History:  Diagnosis Date   Atrial fibrillation ([place])    a. dx 03/2019 while in hospital with cholecystitis   COPD (chronic obstructive pulmonary disease) ([place])    Critical lower limb ischemia (HCC)    a. after TAVR developed ischemic L leg likely due to flap/closure, s/p  emergent repair of left femoral artery with endarterectomy and Dacron patch angioplasty.   Emphysema lung (HCC)    Hyperlipidemia    LDL goal < 70   Hypertension    Hypomagnesemia    Normal coronary arteries 10/2018   NSVT (nonsustained ventricular tachycardia) ([place]) 03/02/2019   Pulmonary HTN ([place]) 06/05/2016   Moderate with PASP 33mHg by echo 04/2016 likely Group 3 from COPD and possibly Group 2 from pulmonary venous HTN associated with moderate AS - f/u echo 12/2018 post TAVR showed normal RVSP   Pulmonary nodules    seen on pre TAVR CT, needs 12 month CT follow up   S/P TAVR (transcatheter aortic valve replacement) 12/30/2018   Medtronic Evolut Pro-Plus THV (size 26 mm, serial # [id number] via the TF approach   Sacral fracture (HCC)    Severe aortic stenosis    a. s/p TAVR 12/2018.   Thoracic ascending aortic aneurysm [place]    needs yearly follow up   Thyroid nodule 11/08/2022    Past Surgical History:  Procedure Laterality Date   [place] 04[place]   Procedure: LAPAROSCOPIC CHOLECYSTECTOMY;  Surgeon: BStark Klein MD;  Location: [facility];  Service: General;  Laterality: N/A;   COLONOSCOPY  2018   FEMORAL-POPLITEAL BYPASS GRAFT Left 12/31/2018   Procedure: PATCH ANGIOPLASTY REPAIR LEFT FEMORAL ARTERY USING [place]  Surgeon: ERosetta Posner MD;  [facility]: [facility]  Service: Vascular;  Laterality: Left;   IR RADIOLOGY PERIPHERAL GUIDED IV START  05/30/2017   IR RADIOLOGY PERIPHERAL GUIDED IV START  09/02/2018   IR RADIOLOGY PERIPHERAL GUIDED IV START  09/02/2018   IR [place] ACCESS LEFT  09/02/2018   IR [place] ACCESS RIGHT  05/30/2017   IR [place] VASC ACCESS RIGHT  09/02/2018   JOINT REPLACEMENT  2016   sacroplasty   LAMINECTOMY     RIGHT/LEFT HEART CATH AND CORONARY ANGIOGRAPHY N/A 11/27/2018   Procedure: RIGHT/LEFT HEART CATH AND CORONARY ANGIOGRAPHY;  Surgeon:  CSherren Mocha MD;  [facility];  Service: Cardiovascular;  Laterality: N/A;   TEE WITHOUT CARDIOVERSION N/A 12/30/2018   Procedure: TRANSESOPHAGEAL ECHOCARDIOGRAM (TEE);  Surgeon: CSherren Mocha MD;  [facility];  Service: Open Heart Surgery;  Laterality: N/A;   TOTAL HIP ARTHROPLASTY Bilateral    TRANSCATHETER AORTIC VALVE REPLACEMENT, TRANSFEMORAL N/A 12/30/2018   Procedure: TRANSCATHETER AORTIC VALVE REPLACEMENT, TRANSFEMORAL;  Surgeon: CSherren Mocha MD;  [facility];  Service: Open Heart Surgery;  Laterality: N/A;   TUBAL LIGATION  1975     Current Outpatient Medications  Medication Sig Dispense Refill   acetaminophen (TYLENOL) 325 MG tablet Take 2 tablets (650 mg total) by mouth every 6 (six) hours as needed for mild pain (or Fever >/= 101).     albuterol (VENTOLIN HFA) 108 (90 Base) MCG/ACT inhaler Inhale 2 puffs into the lungs every  6 (six) hours as needed for wheezing or shortness of breath. 18 g 3   amLODipine (NORVASC) 5 MG tablet TAKE 1 TABLET (5 MG TOTAL) BY MOUTH DAILY. 90 tablet 7   amoxicillin (AMOXIL) 500 MG tablet Take 4 tablets (2,'000mg'$ ) one hour prior to all dental visits. 28 tablet 0   aspirin EC 81 MG tablet Take 81 mg by mouth daily.     Calcium Carb-Cholecalciferol (CALCIUM 600 + D PO) Take 600 mg by mouth 3 (three) times a week.      Cholecalciferol (VITAMIN D3) 5000 UNITS CAPS Take 5,000 Units by mouth daily.      denosumab (PROLIA) 60 MG/ML SOLN injection Inject 60 mg into the skin every 6 (six) months. Administer in upper arm, thigh, or abdomen     EPINEPHrine 0.3 mg/0.3 mL IJ SOAJ injection Inject 0.3 mg into the muscle as needed for anaphylaxis.  12   furosemide (LASIX) 20 MG tablet DAILY AS NEEDED FOR SWELLING 30 tablet 3   hydrochlorothiazide (MICROZIDE) 12.5 MG capsule TAKE 1 CAPSULE BY MOUTH EVERY DAY 90 capsule 1   loratadine (CLARITIN) 10 MG tablet Take 10 mg by mouth daily as needed for allergies.       magnesium oxide (MAG-OX) 400 (240 Mg) MG tablet Take 1 tablet (400 mg total) by mouth daily. 90 tablet 3   metoprolol succinate (TOPROL-XL) 25 MG 24 hr tablet TAKE 1 TABLET BY MOUTH EVERY DAY 90 tablet 0   Multiple Vitamin (MULTIVITAMIN WITH MINERALS) TABS tablet Take 1 tablet by mouth daily.     OXYGEN Inhale 2 L into the lungs as needed.     rosuvastatin (CRESTOR) 40 MG tablet TAKE 1 TABLET BY MOUTH EVERY DAY 90 tablet 0   umeclidinium bromide (INCRUSE ELLIPTA) 62.5 MCG/ACT AEPB Inhale 1 puff into the lungs daily. 30 each 5   No current facility-administered medications for this visit.    Allergies:   Patient has no known allergies.    Social History:  The patient  reports that she quit smoking about 18 years ago. Her smoking use included cigarettes. She started smoking about 67 years ago. She has a 60.00 pack-year smoking history. She has never used smokeless tobacco. She reports current alcohol use of about 3.0 - 4.0 standard drinks of alcohol per week. She reports that she does not use drugs.   Family History:  The patient's family history includes Breast cancer in her mother; Cancer in her brother; Diabetes in her son.    ROS:  Please see the history of present illness.   All other systems are reviewed and negative.    PHYSICAL EXAM: VS:  BP (!) 152/68 (BP Location: Right Arm, Patient Position: Sitting)   Pulse 64   Ht '5\' 6"'$  (1.676 m)   Wt 160 lb 6.4 oz (72.8 kg)   BMI 25.89 kg/m  , BMI Body mass index is 25.89 kg/m. GENERAL:  Well appearing HEENT: Pupils equal round and reactive, fundi not visualized, oral mucosa unremarkable NECK:  No jugular venous distention, waveform within normal limits, carotid upstroke brisk and symmetric, no bruits LUNGS:  Clear to auscultation bilaterally HEART:  RRR.  PMI not displaced or sustained,S1 and S2 within normal limits, no S3, no S4, no clicks, no rubs, III/VI systolic murmur at the  LUSB ABD:  Flat, positive bowel sounds normal in  frequency in pitch, no bruits, no rebound, no guarding, no midline pulsatile mass, no hepatomegaly, no splenomegaly EXT:  2 plus pulses throughout, no  edema, no cyanosis no clubbing SKIN:  No rashes no nodules NEURO:  Cranial nerves II through XII grossly intact, motor grossly intact throughout PSYCH:  Cognitively intact, oriented to person place and time  EKG:  EKG is personally reviewed. 11/08/2022: EKG was not ordered.   Coronary CT-A 05/30/17: IMPRESSION: 1) Calcium score 64 which is 68 th percentile for age and sex   2) Left dominant coronary arteries with less than 50% calcified stenosis in proximal LAD   3) Aortic root dilatation 3.9 cm   4) Aortic and Aortic valve calcification  Coronary CT-A 09/02/18: IMPRESSION: 1. Coronary calcium score of 81.6. This was 49th percentile for age and sex matched control.   2. Normal coronary origin with left dominance.   3.  Non-obstructive CAD in the proximal LAD, unchanged from 05/2017.   Echo 12/31/18 IMPRESSIONS  1. The left ventricle has hyperdynamic systolic function, with an ejection fraction of >65%. The cavity size was normal. There is moderately increased left ventricular wall thickness. Left ventricular diastolic Doppler parameters are consistent with  impaired relaxation Indeterminent filling pressures No evidence of left ventricular regional wall motion abnormalities.  2. The right ventricle has normal systolic function. The cavity was normal. There is no increase in right ventricular wall thickness.  3. Left atrial size was mildly dilated.  4. The mitral valve is degenerative. Mild calcification of the mitral valve leaflet.  5. The aortic root and ascending aorta are normal in size and structure.  6. The inferior vena cava was dilated in size with <50% respiratory variability.  7. No evidence of left ventricular regional wall motion abnormalities.  8. When compared to the prior study: 10/30/2018 - 60-65%, severe AS.   Aortic  Valve: The aortic valve has been repaired/replaced Aortic valve regurgitation was not visualized by color flow Doppler. There is no evidence of aortic valve stenosis.   Carotid Arterial Duplex 03/07/2020: Summary:  Right Carotid: Velocities in the right ICA are consistent with a 1-39%  stenosis. There is no evidence of a 60-79% stenosis as seen on the  prior exam.   Left Carotid: Velocities in the left ICA are consistent with a 1-39%  stenosis.   Vertebrals: Bilateral vertebral arteries demonstrate antegrade flow.  Subclavians: Normal flow hemodynamics were seen in bilateral subclavian arteries.   Echo 10/08/2022: IMPRESSIONS   1. Left ventricular ejection fraction, by estimation, is 55 to 60%. The  left ventricle has normal function. The left ventricle has no regional  wall motion abnormalities. Left ventricular diastolic parameters are  consistent with Grade I diastolic  dysfunction (impaired relaxation).   2. Right ventricular systolic function is mildly reduced. The right  ventricular size is normal. There is normal pulmonary artery systolic  pressure. The estimated right ventricular systolic pressure is 88.3 mmHg.   3. The mitral valve is grossly normal. Trivial mitral valve  regurgitation. No evidence of mitral stenosis.   4. The aortic valve has been repaired/replaced. Aortic valve  regurgitation is trivial- there is both trivial central AI as well as  trivial paravalvular AI. There is a 26 mm Medtronic Evolut Pro valve  present in the aortic position. Procedure Date:  12/30/2018. Aortic valve area, by VTI measures 2.49 cm. Aortic valve mean  gradient measures 4.0 mmHg. Aortic valve Vmax measures 1.38 m/s. Aortic  valve acceleration time measures 67 msec. Grossly normal aortic valve  prosthesis.   5. The inferior vena cava is normal in size with greater than 50%  respiratory variability, suggesting right  atrial pressure of 3 mmHg.   Recent Labs: 07/09/2022: Magnesium  2.2 10/17/2022: ALT 20; BUN 17; Creatinine, Ser 0.77; Hemoglobin 14.5; Platelets 299.0; Potassium 3.9; Sodium 141    Lipid Panel    Component Value Date/Time   CHOL 150 10/17/2022 1113   CHOL 139 05/28/2019 1740   TRIG 79.0 10/17/2022 1113   HDL 67.80 10/17/2022 1113   HDL 54 05/28/2019 1740   CHOLHDL 2 10/17/2022 1113   VLDL 15.8 10/17/2022 1113   LDLCALC 67 10/17/2022 1113   LDLCALC 59 05/28/2019 1740      Wt Readings from Last 3 Encounters:  11/08/22 160 lb 6.4 oz (72.8 kg)  10/17/22 158 lb 6 oz (71.8 kg)  07/09/22 159 lb 6.4 oz (72.3 kg)      ASSESSMENT AND PLAN:  Essential hypertension Blood pressure was elevated both initially and on repeat.  She reports that they are well-controlled at home.  She thinks that this is because she was rushing and because she gets nervous coming to the doctor.  We will give her a sheet to track her blood pressures at home and bring this and her machine to follow-up.  For now, continue HCTZ, amlodipine, and metoprolol.  Her edema has improved significantly since starting HCTZ.  Also encouraged her to work on increasing her exercise.  COPD with hypoxia ([place]) Dr. Larose Kells recommended 24/7 O2.  She has not been using oxygen at home.  She notes that her O2 saturation has been >88%.  In the office today she was initially hypoxic but improved with rest.  She attributes this to rushing.  Recommended that she continue to monitor sats closely and f/u with Dr. Larose Kells and her pulmonary team.  Hyperlipidemia Continue rosuvastatin.  Lipids are well-controlled.  Thoracic ascending aortic aneurysm (HCC) Stable 3.9 cm ascending aorta aneurysm on chest CT 06/2022.  BP well-controlled.  She is on metoprolol.  Carotid stenosis, bilateral Moderate R carotid stenosis.  Mild L carotid study.  She follows with vascular.  Continue aspirin and rosuvastatin.    Thyroid nodule Thyroid nodules noted on prior imaging.  She is due for repeat.  We will order it  now.   Current medicines are reviewed at length with the patient today.  The patient does not have concerns regarding medicines.  The following changes have been made:  none  Labs/ tests ordered today include:   Orders Placed This Encounter  Procedures   US THYROID     Disposition:   FU with Lajoyce Tamura C. [name], MD, [facility] in 6 months. Follow up with [name], NP in 1-2 months.    I,Rachel Rivera,acting as a Education administrator for [name], MD.,have documented all relevant documentation on the behalf of [name], MD,as directed by  [name], MD while in the presence of [name], MD.  I, [place] [name], MD have reviewed all documentation for this visit.  The documentation of the exam, diagnosis, procedures, and orders on 11/08/2022 are all accurate and complete.    Signed, Tameyah Koch C. [name], MD, [facility]  11/08/2022 10:23 AM    [facility]

## 2022-11-08 NOTE — Patient Instructions (Signed)
Medication Instructions:  The current medical regimen is effective;  continue present plan and medications.  *If you need a refill on your cardiac medications before your next appointment, please call your pharmacy*   Lab Work: None    Testing/Procedures: Thyroid Ultrasound   Follow-Up: At [facility], you and your health needs are our priority.  As part of our continuing mission to provide you with exceptional heart care, we have created designated Provider Care Teams.  These Care Teams include your primary Cardiologist (physician) and Advanced Practice Providers (APPs -  Physician Assistants and Nurse Practitioners) who all work together to provide you with the care you need, when you need it.  We recommend signing up for the patient portal called "MyChart".  Sign up information is provided on this After Visit Summary.  MyChart is used to connect with patients for Virtual Visits (Telemedicine).  Patients are able to view lab/test results, encounter notes, upcoming appointments, etc.  Non-urgent messages can be sent to your provider as well.   To learn more about what you can do with MyChart, go to [email or web address].    Your next appointment:   1 month(s) 6 months Dr. [name]  Provider:   [name] MD  Other Instructions  Bring BP log and Bp cuff to next appt with Urban Gibson    Tips to Measure your Blood Pressure Correctly  To determine whether you have hypertension, a medical professional will take a blood pressure reading. How you prepare for the test, the position of your arm, and other factors can change a blood pressure reading by 10% or more. That could be enough to hide high blood pressure, start you on a drug you don't really need, or lead your doctor to incorrectly adjust your medications.  National and international guidelines offer specific instructions for measuring blood pressure. If a doctor, nurse, or medical assistant isn't doing it right,  don't hesitate to ask him or her to get with the guidelines.  Here's what you can do to ensure a correct reading:  Don't drink a caffeinated beverage or smoke during the 30 minutes before the test.  Sit quietly for five minutes before the test begins.  During the measurement, sit in a chair with your feet on the floor and your arm supported so your elbow is at about heart level.  The inflatable part of the cuff should completely cover at least 80% of your upper arm, and the cuff should be placed on bare skin, not over a shirt.  Don't talk during the measurement.  Have your blood pressure measured twice, with a brief break in between. If the readings are different by 5 points or more, have it done a third time.  In 2017, new guidelines from the [place], the [employer] of Cardiology, and nine other health organizations lowered the diagnosis of high blood pressure to 130/80 mm Hg or higher for all adults. The guidelines also redefined the various blood pressure categories to now include normal, elevated, Stage 1 hypertension, Stage 2 hypertension, and hypertensive crisis (see "Blood pressure categories").  Blood pressure categories  Blood pressure category SYSTOLIC (upper number)  DIASTOLIC (lower number)  Normal Less than 120 mm Hg and Less than 80 mm Hg  Elevated 120-129 mm Hg and Less than 80 mm Hg  High blood pressure: Stage 1 hypertension 130-139 mm Hg or 80-89 mm Hg  High blood pressure: Stage 2 hypertension 140 mm Hg or higher or 90 mm Hg or higher  Hypertensive crisis (consult your doctor immediately) Higher than 180 mm Hg and/or Higher than 120 mm Hg  Source: American Heart Association and American Stroke Association. For more on getting your blood pressure under control, buy Controlling Your Blood Pressure, a Special Health Report from [facility].   Blood Pressure Log   Date   Time  Blood Pressure  Position  Example: Nov 1 9 AM 124/78  sitting

## 2022-11-08 NOTE — Assessment & Plan Note (Addendum)
Blood pressure was elevated both initially and on repeat.  She reports that they are well-controlled at home.  She thinks that this is because she was rushing and because she gets nervous coming to the doctor.  We will give her a sheet to track her blood pressures at home and bring this and her machine to follow-up.  For now, continue HCTZ, amlodipine, and metoprolol.  Her edema has improved significantly since starting HCTZ.  Also encouraged her to work on increasing her exercise.

## 2022-11-15 ENCOUNTER — Ambulatory Visit (HOSPITAL_BASED_OUTPATIENT_CLINIC_OR_DEPARTMENT_OTHER)
Admission: RE | Admit: 2022-11-15 | Discharge: 2022-11-15 | Disposition: A | Discharge status: Home / Self Care | Payer: Medicare Other | Source: Ambulatory Visit | Attending: Cardiovascular Disease | Admitting: Cardiovascular Disease

## 2022-11-15 DIAGNOSIS — E042 Nontoxic multinodular goiter: Secondary | ICD-10-CM | POA: Diagnosis not present

## 2022-11-15 DIAGNOSIS — E041 Nontoxic single thyroid nodule: Secondary | ICD-10-CM | POA: Diagnosis not present

## 2022-11-21 ENCOUNTER — Encounter: Payer: Self-pay | Admitting: Pulmonary Disease

## 2022-11-21 ENCOUNTER — Telehealth (HOSPITAL_BASED_OUTPATIENT_CLINIC_OR_DEPARTMENT_OTHER): Payer: Self-pay | Admitting: *Deleted

## 2022-11-21 ENCOUNTER — Ambulatory Visit (INDEPENDENT_AMBULATORY_CARE_PROVIDER_SITE_OTHER): Payer: Medicare Other | Admitting: Pulmonary Disease

## 2022-11-21 VITALS — BP 130/60 | HR 71 | Ht 66.0 in | Wt 160.8 lb

## 2022-11-21 DIAGNOSIS — J449 Chronic obstructive pulmonary disease, unspecified: Secondary | ICD-10-CM | POA: Diagnosis not present

## 2022-11-21 DIAGNOSIS — Z87891 Personal history of nicotine dependence: Secondary | ICD-10-CM | POA: Diagnosis not present

## 2022-11-21 DIAGNOSIS — R918 Other nonspecific abnormal finding of lung field: Secondary | ICD-10-CM

## 2022-11-21 DIAGNOSIS — I10 Essential (primary) hypertension: Secondary | ICD-10-CM

## 2022-11-21 DIAGNOSIS — E042 Nontoxic multinodular goiter: Secondary | ICD-10-CM

## 2022-11-21 MED ORDER — BREZTRI AEROSPHERE 160-9-4.8 MCG/ACT IN AERO: 2.0000 | INHALATION_SPRAY | Freq: Two times a day (BID) | RESPIRATORY_TRACT | 0 refills | Status: DC

## 2022-11-21 NOTE — Progress Notes (Signed)
Synopsis: Referred in April 2021 for abnormal CT by Colon Branch, MD  Subjective:   PATIENT ID: Dominique [name] GENDER: female DOB: Jul 07, 1941, MRN: [id number]  Chief Complaint  Patient presents with   Follow-up    F/up on oxygen    This is a 82 year old female with a past medical history of COPD, atrial fibrillation, TAVR placement by Dr. [name] last year, hypertension moderate pulmonary hypertension.  Patient had CT imaging of the chest which revealed multiple bilateral lung nodules.  Some of which had groundglass areas others had in the left upper lobe adjacent to the pleura more solid component.  Patient underwent PET scan imaging which revealed left upper lobe nodule with an SUV of 10.  Other groundglass areas had low-level PET uptake.  There was some concern for potential infectious etiology.  Therefore patient was referred to pulmonary for recommendations and management.  At this time patient has no significant complaints.  She used to be a former smoker quit many years ago.  60-pack-year history quit in 2005.  OV 05/08/2021: Here today for follow-up after recent CT imaging.  We have been following her lung nodules for some time.  She had a left upper lobe subpleural nodule along the medial surface with an SUV of 10 with multiple other areas of surrounding groundglass and subsolid nodules.  Patient's most recent CT imaging was in July 2022.  This was a 43-monthfollow-up from her December 2021 imaging.  This still demonstrates a mixed subsolid groundglass nodular lesion within the posterior left upper lobe that tends the major fissure this is stable in size.  Additionally there is a small unchanged right pulmonary apex groundglass opacity.  There is a more medial left upper lobe subpleural nodule that was not really discussed in this July 2022 follow-up but was of concern and a PET scan that date back to March 2021 which had an SUV of 10.  At the time was felt to be infectious but on subsequent  follow-up imaging reveals that this nodule remains persistent.  From respiratory standpoint she is doing well.  Unfortunately anxious about everything that is going on.  OV 06/16/2021: Here today for follow-up after recent PET scan imaging.  We have been following her for multiple pulmonary nodules bilateral.  Reviewed PET scan results that has low-level uptake no significant changes in the nodules.  Overall she has no respiratory complaints at this time.  OV 11/21/2022: Patient seen today for follow-up in the office.  She recently saw her primary care provider about a week or so after Thanksgiving she was found to be hypoxemic and started on oxygen.  She felt sick for couple weeks afterwards wondered if she had COVID or not.  She been using her inhaler for COPD but the inhaler is expensive wanted to know if we can switch to something else.  She also not sure if it is helping as much anymore she have CT follow-up for her lung nodules in June 2024 which is already scheduled.      Past Medical History:  Diagnosis Date   Atrial fibrillation ([facility]    a. dx 03/2019 while in hospital with cholecystitis   COPD (chronic obstructive pulmonary disease) ([facility]    Critical lower limb ischemia (HCC)    a. after TAVR developed ischemic L leg likely due to flap/closure, s/p emergent repair of left femoral artery with endarterectomy and Dacron patch angioplasty.   Emphysema lung (HCC)    Hyperlipidemia    LDL  goal < 70   Hypertension    Hypomagnesemia    Normal coronary arteries 10/2018   NSVT (nonsustained ventricular tachycardia) ([place]) 03/02/2019   Pulmonary HTN ([place]) 06/05/2016   Moderate with PASP 23mHg by echo 04/2016 likely Group 3 from COPD and possibly Group 2 from pulmonary venous HTN associated with moderate AS - f/u echo 12/2018 post TAVR showed normal RVSP   Pulmonary nodules    seen on pre TAVR CT, needs 12 month CT follow up   S/P TAVR (transcatheter aortic valve replacement) 12/30/2018    Medtronic Evolut Pro-Plus THV (size 26 mm, serial # [id number] via the TF approach   Sacral fracture (HCC)    Severe aortic stenosis    a. s/p TAVR 12/2018.   Thoracic ascending aortic aneurysm ([facility]    needs yearly follow up   Thyroid nodule 11/08/2022     Family History  Problem Relation Age of Onset   Breast cancer Mother        breast   Diabetes Son        type 1   Cancer Brother        lung   Colon cancer Neg Hx    CAD Neg Hx    Colon polyps Neg Hx    Rectal cancer Neg Hx    Stomach cancer Neg Hx      Past Surgical History:  Procedure Laterality Date   APPENDECTOMY  1951   CATARACT EXTRACTION     CESAREAN SECTION     1971   CHOLECYSTECTOMY N/A 04/28/2019   Procedure: LAPAROSCOPIC CHOLECYSTECTOMY;  Surgeon: BStark Klein MD;  Location: [facility];  Service: General;  Laterality: N/A;   COLONOSCOPY  2018   FEMORAL-POPLITEAL BYPASS GRAFT Left 12/31/2018   Procedure: PATCH ANGIOPLASTY REPAIR LEFT FEMORAL ARTERY USING [place]  Surgeon: ERosetta Posner MD;  [facility]  Service: Vascular;  Laterality: Left;   IR RADIOLOGY PERIPHERAL GUIDED IV START  05/30/2017   IR RADIOLOGY PERIPHERAL GUIDED IV START  09/02/2018   IR RADIOLOGY PERIPHERAL GUIDED IV START  09/02/2018   IR [place] VASC ACCESS LEFT  09/02/2018   IR [place] VASC ACCESS RIGHT  05/30/2017   [place]  09/02/2018   JOINT REPLACEMENT  2016   sacroplasty   LAMINECTOMY     RIGHT/LEFT HEART CATH AND CORONARY ANGIOGRAPHY N/A 11/27/2018   Procedure: RIGHT/LEFT HEART CATH AND CORONARY ANGIOGRAPHY;  Surgeon: CSherren Mocha MD;  [facility];  Service: Cardiovascular;  Laterality: N/A;   TEE WITHOUT CARDIOVERSION N/A 12/30/2018   Procedure: TRANSESOPHAGEAL ECHOCARDIOGRAM (TEE);  Surgeon: CSherren Mocha MD;  [facility];  Service: Open Heart Surgery;  Laterality: N/A;   TOTAL HIP ARTHROPLASTY Bilateral    TRANSCATHETER AORTIC VALVE REPLACEMENT,  TRANSFEMORAL N/A 12/30/2018   Procedure: TRANSCATHETER AORTIC VALVE REPLACEMENT, TRANSFEMORAL;  Surgeon: CSherren Mocha MD;  [facility];  Service: Open Heart Surgery;  Laterality: N/A;   TUBAL LIGATION  1975    Social History   Socioeconomic History   Marital status: Widowed    Spouse name: Not on file   Number of children: 2   Years of education: Not on file   Highest education level: Not on file  Occupational History   Occupation: n/a  Tobacco Use   Smoking status: Former    Packs/day: 1.50    Years: 40.00    Total pack years: 60.00  Types: Cigarettes    Start date: 14    Quit date: 03/19/2004    Years since quitting: 18.6   Smokeless tobacco: Never   Tobacco comments:    Started smoking at age 40  Vaping Use   Vaping Use: Never used  Substance and Sexual Activity   Alcohol use: Yes    Alcohol/week: 3.0 - 4.0 standard drinks of alcohol    Types: 3 - 4 Glasses of wine per week    Comment: socially   Drug use: No   Sexual activity: Not on file  Other Topics Concern   Not on file  Social History Narrative   From [place]   Lost a son to DM   1 living son   Social Determinants of Health   Financial Resource Strain: Low Risk  (03/22/2021)   Overall Financial Resource Strain (CARDIA)    Difficulty of Paying Living Expenses: Not very hard  Food Insecurity: No Food Insecurity (08/17/2021)   Hunger Vital Sign    Worried About Running Out of Food in the Last Year: Never true    [place] in the Last Year: Never true  Transportation Needs: No Transportation Needs (08/17/2021)   PRAPARE - Hydrologist (Medical): No    Lack of Transportation (Non-Medical): No  Physical Activity: Insufficiently Active (03/22/2021)   Exercise Vital Sign    Days of Exercise per Week: 7 days    Minutes of Exercise per Session: 20 min  Stress: No Stress Concern Present (08/17/2021)   [place]    Feeling of Stress : Not at all  Social Connections: Moderately Isolated (08/17/2021)   Social Connection and Isolation Panel [NHANES]    Frequency of Communication with Friends and Family: More than three times a week    Frequency of Social Gatherings with Friends and Family: More than three times a week    Attends Religious Services: Never    Marine scientist or Organizations: Yes    Attends Music therapist: More than 4 times per year    Marital Status: Widowed  Intimate Partner Violence: Not At Risk (08/17/2021)   Humiliation, Afraid, Rape, and Kick questionnaire    Fear of Current or Ex-Partner: No    Emotionally Abused: No    Physically Abused: No    Sexually Abused: No     No Known Allergies   Outpatient Medications Prior to Visit  Medication Sig Dispense Refill   albuterol (VENTOLIN HFA) 108 (90 Base) MCG/ACT inhaler Inhale 2 puffs into the lungs every 6 (six) hours as needed for wheezing or shortness of breath. 18 g 3   loratadine (CLARITIN) 10 MG tablet Take 10 mg by mouth daily as needed for allergies.      umeclidinium bromide (INCRUSE ELLIPTA) 62.5 MCG/ACT AEPB Inhale 1 puff into the lungs daily. 30 each 5   acetaminophen (TYLENOL) 325 MG tablet Take 2 tablets (650 mg total) by mouth every 6 (six) hours as needed for mild pain (or Fever >/= 101).     amLODipine (NORVASC) 5 MG tablet TAKE 1 TABLET (5 MG TOTAL) BY MOUTH DAILY. 90 tablet 7   amoxicillin (AMOXIL) 500 MG tablet Take 4 tablets (2,'000mg'$ ) one hour prior to all dental visits. 28 tablet 0   aspirin EC 81 MG tablet Take 81 mg by mouth daily.     Calcium Carb-Cholecalciferol (CALCIUM 600 + D PO) Take 600 mg  by mouth 3 (three) times a week.      Cholecalciferol (VITAMIN D3) 5000 UNITS CAPS Take 5,000 Units by mouth daily.      denosumab (PROLIA) 60 MG/ML SOLN injection Inject 60 mg into the skin every 6 (six) months. Administer in upper arm, thigh, or abdomen      EPINEPHrine 0.3 mg/0.3 mL IJ SOAJ injection Inject 0.3 mg into the muscle as needed for anaphylaxis.  12   furosemide (LASIX) 20 MG tablet DAILY AS NEEDED FOR SWELLING 30 tablet 3   hydrochlorothiazide (MICROZIDE) 12.5 MG capsule TAKE 1 CAPSULE BY MOUTH EVERY DAY 90 capsule 1   magnesium oxide (MAG-OX) 400 (240 Mg) MG tablet Take 1 tablet (400 mg total) by mouth daily. 90 tablet 3   metoprolol succinate (TOPROL-XL) 25 MG 24 hr tablet TAKE 1 TABLET BY MOUTH EVERY DAY 90 tablet 0   Multiple Vitamin (MULTIVITAMIN WITH MINERALS) TABS tablet Take 1 tablet by mouth daily.     OXYGEN Inhale 2 L into the lungs as needed.     rosuvastatin (CRESTOR) 40 MG tablet TAKE 1 TABLET BY MOUTH EVERY DAY 90 tablet 0   No facility-administered medications prior to visit.    Review of Systems  Constitutional:  Negative for chills, fever, malaise/fatigue and weight loss.  HENT:  Negative for hearing loss, sore throat and tinnitus.   Eyes:  Negative for blurred vision and double vision.  Respiratory:  Negative for cough, hemoptysis, sputum production, shortness of breath, wheezing and stridor.   Cardiovascular:  Negative for chest pain, palpitations, orthopnea, leg swelling and PND.  Gastrointestinal:  Negative for abdominal pain, constipation, diarrhea, heartburn, nausea and vomiting.  Genitourinary:  Negative for dysuria, hematuria and urgency.  Musculoskeletal:  Negative for joint pain and myalgias.  Skin:  Negative for itching and rash.  Neurological:  Negative for dizziness, tingling, weakness and headaches.  Endo/Heme/Allergies:  Negative for environmental allergies. Does not bruise/bleed easily.  Psychiatric/Behavioral:  Negative for depression. The patient is not nervous/anxious and does not have insomnia.   All other systems reviewed and are negative.    Objective:  Physical Exam Vitals reviewed.  Constitutional:      General: She is not in acute distress.    Appearance: She is well-developed.   HENT:     Head: Normocephalic and atraumatic.  Eyes:     General: No scleral icterus.    Conjunctiva/sclera: Conjunctivae normal.     Pupils: Pupils are equal, round, and reactive to light.  Neck:     Vascular: No JVD.     Trachea: No tracheal deviation.  Cardiovascular:     Rate and Rhythm: Normal rate and regular rhythm.     Heart sounds: Murmur heard.  Pulmonary:     Effort: Pulmonary effort is normal. No tachypnea, accessory muscle usage or respiratory distress.     Breath sounds: No stridor. No wheezing, rhonchi or rales.     Comments: Diminished breath sounds Abdominal:     General: Bowel sounds are normal. There is no distension.     Palpations: Abdomen is soft.     Tenderness: There is no abdominal tenderness.  Musculoskeletal:        General: No tenderness.     Cervical back: Neck supple.  Lymphadenopathy:     Cervical: No cervical adenopathy.  Skin:    General: Skin is warm and dry.     Capillary Refill: Capillary refill takes less than 2 seconds.     Findings: No rash.  Neurological:     Mental Status: She is alert and oriented to person, place, and time.  Psychiatric:        Behavior: Behavior normal.      Vitals:   11/21/22 1625  BP: 130/60  Pulse: 71  SpO2: 96%  Weight: 160 lb 12.8 oz (72.9 kg)  Height: '5\' 6"'$  (1.676 m)   96% on RA BMI Readings from Last 3 Encounters:  11/21/22 25.95 kg/m  11/08/22 25.89 kg/m  10/17/22 25.56 kg/m   Wt Readings from Last 3 Encounters:  11/21/22 160 lb 12.8 oz (72.9 kg)  11/08/22 160 lb 6.4 oz (72.8 kg)  10/17/22 158 lb 6 oz (71.8 kg)     CBC    Component Value Date/Time   WBC 8.7 10/17/2022 1113   RBC 4.57 10/17/2022 1113   HGB 14.5 10/17/2022 1113   HGB 14.9 07/21/2020 1107   HCT 42.3 10/17/2022 1113   HCT 45.9 07/21/2020 1107   PLT 299.0 10/17/2022 1113   PLT 236 07/21/2020 1107   MCV 92.6 10/17/2022 1113   MCV 95 07/21/2020 1107   MCH 31.1 05/20/2021 1146   MCHC 34.2 10/17/2022 1113   RDW  14.0 10/17/2022 1113   RDW 12.8 07/21/2020 1107   LYMPHSABS 1.8 10/17/2022 1113   LYMPHSABS 1.4 11/13/2018 1310   MONOABS 0.7 10/17/2022 1113   EOSABS 0.5 10/17/2022 1113   EOSABS 0.5 (H) 11/13/2018 1310   BASOSABS 0.1 10/17/2022 1113   BASOSABS 0.1 11/13/2018 1310     Chest Imaging: 01/14/2020: CT chest Subsolid 2.4 cm pulmonary nodule concerning for potential malignancy scattered groundglass nodule within the right upper lobe.  01/27/2020 nuclear medicine pet imaging abnormal PET activity within the left upper lobe does not go above mediastinal blood pool however does not exclude low-grade adenocarcinoma. Also has clustered nodularity within the left upper lobe concerning for potential inflammatory disorder.  Or infection. The patient's images have been independently reviewed by me.    05/02/2021 CT chest: Bilateral mixed solid and subsolid groundglass opacities both within the upper lobes.  The left upper lobe medial subpleural nodule is still present. In comparison to previous PET scan results of this lesion is still there since March 2021 was PET avid thn. The patient's images have been independently reviewed by me.    August 2022 PET/CT: Reviewed images today with patient.  Low-level uptake within these have solid lesions within the chest.  Reviewed images with patient. The patient's images have been independently reviewed by me.     Pulmonary Functions Testing Results:    Latest Ref Rng & Units 12/09/2018    2:04 PM 06/18/2016    1:36 PM  PFT Results  FVC-Pre L 1.77  2.08   FVC-Predicted Pre % 62  72   FVC-Post L 2.17  2.35   FVC-Predicted Post % 77  81   Pre FEV1/FVC % % 60  53   Post FEV1/FCV % % 49  53   FEV1-Pre L 1.06  1.11   FEV1-Predicted Pre % 50  51   FEV1-Post L 1.07  1.25   DLCO uncorrected ml/min/mmHg 8.82  10.71   DLCO UNC% % 45  41   DLVA Predicted % 63  60   TLC L 5.43  4.69   TLC % Predicted % 104  90   RV % Predicted % 145  99     FeNO:  none  Pathology: none  Echocardiogram: none  Heart Catheterization: none    Assessment &  Plan:     ICD-10-CM   1. Chronic obstructive pulmonary disease, unspecified COPD type ([place])  J44.9     2. Multiple lung nodules  R91.8     3. Former smoker  Z87.891       Assessment:   This is an 82 year old female abnormal CT imaging PET scan in the past has multiple subsolid groundglass nodules.  Nodules are stable on previous CT imaging she has CT scan follow-up in June 2024 already scheduled.  She was recently started on oxygen.  This was after an illness.  She is no longer needing oxygen and O2 sats remained in the 90s even with exertion.  She has been monitoring her checking it at home.  Plan: Given new samples of Breztri and a prescription In the meantime stop Incruse Continue albuterol as needed for COPD. Follow-up with [place] in June after CT scan to ensure nodules are stable. Okay to continue monitoring O2 at home.    Current Outpatient Medications:    albuterol (VENTOLIN HFA) 108 (90 Base) MCG/ACT inhaler, Inhale 2 puffs into the lungs every 6 (six) hours as needed for wheezing or shortness of breath., Disp: 18 g, Rfl: 3   loratadine (CLARITIN) 10 MG tablet, Take 10 mg by mouth daily as needed for allergies. , Disp: , Rfl:    umeclidinium bromide (INCRUSE ELLIPTA) 62.5 MCG/ACT AEPB, Inhale 1 puff into the lungs daily., Disp: 30 each, Rfl: 5   acetaminophen (TYLENOL) 325 MG tablet, Take 2 tablets (650 mg total) by mouth every 6 (six) hours as needed for mild pain (or Fever >/= 101)., Disp:  , Rfl:    amLODipine (NORVASC) 5 MG tablet, TAKE 1 TABLET (5 MG TOTAL) BY MOUTH DAILY., Disp: 90 tablet, Rfl: 7   amoxicillin (AMOXIL) 500 MG tablet, Take 4 tablets (2,'000mg'$ ) one hour prior to all dental visits., Disp: 28 tablet, Rfl: 0   aspirin EC 81 MG tablet, Take 81 mg by mouth daily., Disp: , Rfl:    Calcium Carb-Cholecalciferol (CALCIUM 600 + D PO), Take 600 mg by mouth 3 (three) times a  week. , Disp: , Rfl:    Cholecalciferol (VITAMIN D3) 5000 UNITS CAPS, Take 5,000 Units by mouth daily. , Disp: , Rfl:    denosumab (PROLIA) 60 MG/ML SOLN injection, Inject 60 mg into the skin every 6 (six) months. Administer in upper arm, thigh, or abdomen, Disp: , Rfl:    EPINEPHrine 0.3 mg/0.3 mL IJ SOAJ injection, Inject 0.3 mg into the muscle as needed for anaphylaxis., Disp: , Rfl: 12   furosemide (LASIX) 20 MG tablet, DAILY AS NEEDED FOR SWELLING, Disp: 30 tablet, Rfl: 3   hydrochlorothiazide (MICROZIDE) 12.5 MG capsule, TAKE 1 CAPSULE BY MOUTH EVERY DAY, Disp: 90 capsule, Rfl: 1   magnesium oxide (MAG-OX) 400 (240 Mg) MG tablet, Take 1 tablet (400 mg total) by mouth daily., Disp: 90 tablet, Rfl: 3   metoprolol succinate (TOPROL-XL) 25 MG 24 hr tablet, TAKE 1 TABLET BY MOUTH EVERY DAY, Disp: 90 tablet, Rfl: 0   Multiple Vitamin (MULTIVITAMIN WITH MINERALS) TABS tablet, Take 1 tablet by mouth daily., Disp: , Rfl:    OXYGEN, Inhale 2 L into the lungs as needed., Disp: , Rfl:    rosuvastatin (CRESTOR) 40 MG tablet, TAKE 1 TABLET BY MOUTH EVERY DAY, Disp: 90 tablet, Rfl: 0   Garner Nash, DO [facility] 11/21/2022 4:42 PM

## 2022-11-21 NOTE — Telephone Encounter (Signed)
-----  Message from Loel Dubonnet, NP sent at 11/21/2022  4:31 PM EST ----- Duplex reveals 5 thyroid nodules. One of them meets criteria for FNA (fine needle aspiration). Recommend referral to endocrinology for further evaluation.  (Often Dr. Almetta Lovely office is good about getting people in promptly, can refer her there if she does not already have an endocrinologist)

## 2022-11-21 NOTE — Patient Instructions (Signed)
Thank you for visiting Dr. Valeta Harms at [facility]. Today we recommend the following:  Samples of breztri  We will send a new prescription in for breztri too   Return in about 5 months (around 04/22/2023) for with Eric Form, NP.    Please do your part to reduce the spread of COVID-19.

## 2022-11-21 NOTE — Telephone Encounter (Signed)
Left message to call back  

## 2022-11-22 NOTE — Telephone Encounter (Signed)
Patient returned call

## 2022-11-22 NOTE — Telephone Encounter (Signed)
Returned call to patient and provided the following information. Referral placed, and records faxed.    "----- Message from Loel Dubonnet, NP sent at 11/21/2022  4:31 PM EST ----- Duplex reveals 5 thyroid nodules. One of them meets criteria for FNA (fine needle aspiration). Recommend referral to endocrinology for further evaluation.   (Often Dr. Almetta Lovely office is good about getting people in promptly, can refer her there if she does not already have an endocrinologist)"

## 2022-11-23 ENCOUNTER — Telehealth: Payer: Self-pay | Admitting: Pulmonary Disease

## 2022-11-23 DIAGNOSIS — J449 Chronic obstructive pulmonary disease, unspecified: Secondary | ICD-10-CM

## 2022-11-23 NOTE — Telephone Encounter (Signed)
PT was told on last appt she did not need O2 anymore. Pharm needs letter fax'd stating that before they will stop sending it. Pls send fax.  Call to advise PT @ [phone]

## 2022-11-26 ENCOUNTER — Telehealth (HOSPITAL_BASED_OUTPATIENT_CLINIC_OR_DEPARTMENT_OTHER): Payer: Self-pay | Admitting: Cardiovascular Disease

## 2022-11-26 NOTE — Telephone Encounter (Signed)
DME order placed to [facility]. Called and updated patient that order was placed. Nothing further needed

## 2022-11-26 NOTE — Telephone Encounter (Signed)
Office stated the referral was received but they need a demographics on the patient. Fax to [phone]. Please advise

## 2022-11-26 NOTE — Telephone Encounter (Signed)
Called patient and she states that she was told at last office visit that she did not need her oxygen anymore. She states she uses [employer].   Sir are you ok with me sending in the order to discontinue oxygen  Please advise

## 2022-11-26 NOTE — Telephone Encounter (Signed)
Demographics sheet printed and faxed back.

## 2022-12-05 ENCOUNTER — Other Ambulatory Visit: Payer: Self-pay | Admitting: Cardiovascular Disease

## 2022-12-05 NOTE — Telephone Encounter (Signed)
Rx(s) sent to pharmacy electronically.  

## 2022-12-07 ENCOUNTER — Ambulatory Visit (INDEPENDENT_AMBULATORY_CARE_PROVIDER_SITE_OTHER): Payer: Medicare Other | Admitting: Family

## 2022-12-07 ENCOUNTER — Encounter (HOSPITAL_BASED_OUTPATIENT_CLINIC_OR_DEPARTMENT_OTHER): Payer: Self-pay | Admitting: Family

## 2022-12-07 VITALS — BP 134/60 | HR 74 | Ht 66.0 in | Wt 164.0 lb

## 2022-12-07 DIAGNOSIS — I10 Essential (primary) hypertension: Secondary | ICD-10-CM

## 2022-12-07 DIAGNOSIS — E785 Hyperlipidemia, unspecified: Secondary | ICD-10-CM | POA: Diagnosis not present

## 2022-12-07 DIAGNOSIS — I6523 Occlusion and stenosis of bilateral carotid arteries: Secondary | ICD-10-CM

## 2022-12-07 DIAGNOSIS — I350 Nonrheumatic aortic (valve) stenosis: Secondary | ICD-10-CM | POA: Diagnosis not present

## 2022-12-07 DIAGNOSIS — Z952 Presence of prosthetic heart valve: Secondary | ICD-10-CM | POA: Diagnosis not present

## 2022-12-07 DIAGNOSIS — I25118 Atherosclerotic heart disease of native coronary artery with other forms of angina pectoris: Secondary | ICD-10-CM | POA: Diagnosis not present

## 2022-12-07 NOTE — Patient Instructions (Addendum)
Medication Instructions:  Continue your current medications.   *If you need a refill on your cardiac medications before your next appointment, please call your pharmacy*   Lab Work/Testing/Procedures: None ordered today.   Follow-Up: At [facility], you and your health needs are our priority.  As part of our continuing mission to provide you with exceptional heart care, we have created designated Provider Care Teams.  These Care Teams include your primary Cardiologist (physician) and Advanced Practice Providers (APPs -  Physician Assistants and Nurse Practitioners) who all work together to provide you with the care you need, when you need it.  We recommend signing up for the patient portal called "MyChart".  Sign up information is provided on this After Visit Summary.  MyChart is used to connect with patients for Virtual Visits (Telemedicine).  Patients are able to view lab/test results, encounter notes, upcoming appointments, etc.  Non-urgent messages can be sent to your provider as well.   To learn more about what you can do with MyChart, go to [email or web address].    Your next appointment:   4-6 month(s)  Provider:   [name], MD or [name], NP    Other Instructions   Recommend scheduling follow up with Vascular Surgery. This is to monitor your prior surgery and carotid arteries (arteries in your neck). You can call them to schedule - okay to wait until after your appointment with endocrinology.   [facility] at [place],  [place]  [id number] Get Driving Directions Main: [phone]  ___________________________  Your home blood pressure cuff was found to read about 10 points high. If your blood pressure at home is consistently more than 140 for the top number, call and let us know. Continue to check 2-3 times per week.

## 2022-12-07 NOTE — Progress Notes (Signed)
Office Visit    Patient Name: Dominique [name] Date of Encounter: 12/07/2022  PCP:  Colon Branch, [place]  Cardiologist:  [name], MD  Advanced Practice Provider:  No care team member to display Electrophysiologist:  None      Chief Complaint    Dominique [name] is a 82 y.o. female presents today for hypertension follow up.    Past Medical History    Past Medical History:  Diagnosis Date   Atrial fibrillation ([place])    a. dx 03/2019 while in hospital with cholecystitis   COPD (chronic obstructive pulmonary disease) ([place])    Critical lower limb ischemia (HCC)    a. after TAVR developed ischemic L leg likely due to flap/closure, s/p emergent repair of left femoral artery with endarterectomy and Dacron patch angioplasty.   Emphysema lung (HCC)    Hyperlipidemia    LDL goal < 70   Hypertension    Hypomagnesemia    Normal coronary arteries 10/2018   NSVT (nonsustained ventricular tachycardia) ([place]) 03/02/2019   Pulmonary HTN ([place]) 06/05/2016   Moderate with PASP 36mHg by echo 04/2016 likely Group 3 from COPD and possibly Group 2 from pulmonary venous HTN associated with moderate AS - f/u echo 12/2018 post TAVR showed normal RVSP   Pulmonary nodules    seen on pre TAVR CT, needs 12 month CT follow up   S/P TAVR (transcatheter aortic valve replacement) 12/30/2018   Medtronic Evolut Pro-Plus THV (size 26 mm, serial # [id number] via the TF approach   Sacral fracture (HCC)    Severe aortic stenosis    a. s/p TAVR 12/2018.   Thoracic ascending aortic aneurysm ([place]    needs yearly follow up   Thyroid nodule 11/08/2022   Past Surgical History:  Procedure Laterality Date   AIaegerN/A 04/28/2019   Procedure: LAPAROSCOPIC CHOLECYSTECTOMY;  Surgeon: BStark Klein MD;  Location: [facility];  Service: General;  Laterality: N/A;   COLONOSCOPY  2018   FEMORAL-POPLITEAL  BYPASS GRAFT Left 12/31/2018   Procedure: PATCH ANGIOPLASTY REPAIR LEFT FEMORAL ARTERY USING [place]  Surgeon: ERosetta Posner MD;  [facility]  Service: Vascular;  Laterality: Left;   IR RADIOLOGY PERIPHERAL GUIDED IV START  05/30/2017   IR RADIOLOGY PERIPHERAL GUIDED IV START  09/02/2018   IR RADIOLOGY PERIPHERAL GUIDED IV START  09/02/2018   IR [place] VASC ACCESS LEFT  09/02/2018   IR [place] VASC ACCESS RIGHT  05/30/2017   IR [place] VASC ACCESS RIGHT  09/02/2018   JOINT REPLACEMENT  2016   sacroplasty   LAMINECTOMY     RIGHT/LEFT HEART CATH AND CORONARY ANGIOGRAPHY N/A 11/27/2018   Procedure: RIGHT/LEFT HEART CATH AND CORONARY ANGIOGRAPHY;  Surgeon: CSherren Mocha MD;  [facility];  Service: Cardiovascular;  Laterality: N/A;   TEE WITHOUT CARDIOVERSION N/A 12/30/2018   Procedure: TRANSESOPHAGEAL ECHOCARDIOGRAM (TEE);  Surgeon: CSherren Mocha MD;  [facility];  Service: Open Heart Surgery;  Laterality: N/A;   TOTAL HIP ARTHROPLASTY Bilateral    TRANSCATHETER AORTIC VALVE REPLACEMENT, TRANSFEMORAL N/A 12/30/2018   Procedure: TRANSCATHETER AORTIC VALVE REPLACEMENT, TRANSFEMORAL;  Surgeon: CSherren Mocha MD;  [facility];  Service: Open Heart Surgery;  Laterality: N/A;   TUBAL LIGATION  1975    Allergies  No  Known Allergies  History of Present Illness    Dominique [name] is a 82 y.o. female with a hx of nonobstructive coronary artery disease, lower extremity edema, hyperlipidemia, hypertension, SVT, thyroid nodule, aortic valve stenosis s/p TAVR 12/2018, carotid artery stenosis last seen 11/08/2022  Prior nonobstructive CAD on coronary CTA 2018 in 2019.  Cardiac catheterization January 2020 with normal coronary anatomy.  She underwent TAVR 01/16/2019.  Preprocedure cardiac catheterization with normal coronary arteries.  Postoperatively she had severe acute anemia and underwent emergent repair of left femoral artery  with endarterectomy and dacryon patch angioplasty.  Postoperatively noted to have atrial fibrillation and was placed on Toprol 25 mg daily.  Did not require anticoagulation as was limited episode of atrial fibrillation on cardiac monitor.  Carotid Dopplers 05/2019 with right ICA 60 to 79% stenosis of left carotid 1 to 39% stenosis.  Referred to vascular surgery.  Ultrasound showed multiple bilateral thyroid nodules which were recommended for surveillance.  Echo 09/2021 LVEF 60 to 123456, grade 1 diastolic dysfunction, RV normal size and function, mildly elevated PASP, trivial MR, aortic valve with trivial AI mean gradient 7.7 mmHg.  No significant change from prior.  She was seen 07/09/2022 noting dyspnea which was improved with as needed albuterol inhaler.  She reported an episode of syncope 2 to 3 months prior and follow-up echo 10/08/2022 with normal LVEF 55 to 123456, grade 1 diastolic dysfunction, trivial perivalvular aortic insufficiency.  She last saw Dr. Cordella Register 11/08/2022.  Pulmonary had recently prescribed oxygen.  Blood pressure at home was 130s over 60s.  It was elevated in clinic though she attributed this to rushing into clinic.  Her current regimen was continued and she was recommended to monitor at home.  Updated thyroid ultrasound for nodules revealed enlarged thyroid related with stable bilateral nodules, nodule 5 did meet criteria for FNA.  She was referred to endocrinology.  She presents today for follow up. She has been able to get off home oxygen which she has pleased with. At home her oxygen level has routinely been >94%. Reports no shortness of breath nor dyspnea on exertion. Reports no chest pain, pressure, or tightness. No edema, orthopnea, PND. Reports no palpitations.  Her home BP cuff is found to read 10 points high today in clinic. BP at home 100s/50s-139/60 rare reading of 140s/60s No lightheadedness, dizziness. Notes some exertional dyspnea which she attributes to deconditioning.  She  inquires about handicap placard and we discussed that we typically only give these out for 6 months increments she was thereafter uninterested.  EKGs/Labs/Other Studies Reviewed:   The following studies were reviewed today:  Echo 10/08/2022  1. Left ventricular ejection fraction, by estimation, is 55 to 60%. The  left ventricle has normal function. The left ventricle has no regional  wall motion abnormalities. Left ventricular diastolic parameters are  consistent with Grade I diastolic  dysfunction (impaired relaxation).   2. Right ventricular systolic function is mildly reduced. The right  ventricular size is normal. There is normal pulmonary artery systolic  pressure. The estimated right ventricular systolic pressure is [id number] mmHg.   3. The mitral valve is grossly normal. Trivial mitral valve  regurgitation. No evidence of mitral stenosis.   4. The aortic valve has been repaired/replaced. Aortic valve  regurgitation is trivial- there is both trivial central AI as well as  trivial paravalvular AI. There is a 26 mm Medtronic Evolut Pro valve  present in the aortic position. Procedure Date:  12/30/2018. Aortic valve area, by VTI  measures 2.49 cm. Aortic valve mean  gradient measures 4.0 mmHg. Aortic valve Vmax measures 1.38 m/s. Aortic  valve acceleration time measures 67 msec. Grossly normal aortic valve  prosthesis.   5. The inferior vena cava is normal in size with greater than 50%  respiratory variability, suggesting right atrial pressure of 3 mmHg.  Echo 09/2021  1. Left ventricular ejection fraction, by estimation, is 60 to 65%. Left  ventricular ejection fraction by 3D volume is 66 %. The left ventricle has  normal function. The left ventricle has no regional wall motion  abnormalities. Left ventricular diastolic   parameters are consistent with Grade I diastolic dysfunction (impaired  relaxation).   2. Right ventricular systolic function is normal. The right ventricular  size  is normal. There is mildly elevated pulmonary artery systolic  pressure.   3. The mitral valve is normal in structure. Trivial mitral valve  regurgitation. No evidence of mitral stenosis.   4. The aortic valve has been repaired/replaced. Aortic valve  regurgitation is trivial. Aortic valve mean gradient measures 7.7 mmHg.  Aortic valve Vmax measures 1.94 m/s.   Comparison(s): No significant change from prior study. EF 65% GLS -19.3%,  Medtronic Evolut Pro-Plus THV (size 26 mm), trivial AI, mean gradient  26mHg, peak gradient 116mg, asc aor 3853m  EKG:  EKG is not ordered today.    Recent Labs: 07/09/2022: Magnesium 2.2 10/17/2022: ALT 20; BUN 17; Creatinine, Ser 0.77; Hemoglobin 14.5; Platelets 299.0; Potassium 3.9; Sodium 141  Recent Lipid Panel    Component Value Date/Time   CHOL 150 10/17/2022 1113   CHOL 139 05/28/2019 1740   TRIG 79.0 10/17/2022 1113   HDL 67.80 10/17/2022 1113   HDL 54 05/28/2019 1740   CHOLHDL 2 10/17/2022 1113   VLDL 15.8 10/17/2022 1113   LDLCALC 67 10/17/2022 1113   LDLCALC 59 05/28/2019 1740   Home Medications   Current Meds  Medication Sig   acetaminophen (TYLENOL) 325 MG tablet Take 2 tablets (650 mg total) by mouth every 6 (six) hours as needed for mild pain (or Fever >/= 101).   albuterol (VENTOLIN HFA) 108 (90 Base) MCG/ACT inhaler Inhale 2 puffs into the lungs every 6 (six) hours as needed for wheezing or shortness of breath.   amLODipine (NORVASC) 5 MG tablet TAKE 1 TABLET (5 MG TOTAL) BY MOUTH DAILY.   amoxicillin (AMOXIL) 500 MG tablet Take 4 tablets (2,000m52mne hour prior to all dental visits.   aspirin EC 81 MG tablet Take 81 mg by mouth daily.   Budeson-Glycopyrrol-Formoterol (BREZTRI AEROSPHERE) 160-9-4.8 MCG/ACT AERO Inhale 2 puffs into the lungs in the morning and at bedtime.   Calcium Carb-Cholecalciferol (CALCIUM 600 + D PO) Take 600 mg by mouth 3 (three) times a week.    Cholecalciferol (VITAMIN D3) 5000 UNITS CAPS Take 5,000  Units by mouth daily.    denosumab (PROLIA) 60 MG/ML SOLN injection Inject 60 mg into the skin every 6 (six) months. Administer in upper arm, thigh, or abdomen   EPINEPHrine 0.3 mg/0.3 mL IJ SOAJ injection Inject 0.3 mg into the muscle as needed for anaphylaxis.   furosemide (LASIX) 20 MG tablet DAILY AS NEEDED FOR SWELLING   hydrochlorothiazide (MICROZIDE) 12.5 MG capsule TAKE 1 CAPSULE BY MOUTH EVERY DAY   loratadine (CLARITIN) 10 MG tablet Take 10 mg by mouth daily as needed for allergies.    magnesium oxide (MAG-OX) 400 (240 Mg) MG tablet Take 1 tablet (400 mg total) by mouth daily.   metoprolol  succinate (TOPROL-XL) 25 MG 24 hr tablet TAKE 1 TABLET BY MOUTH EVERY DAY   Multiple Vitamin (MULTIVITAMIN WITH MINERALS) TABS tablet Take 1 tablet by mouth daily.   rosuvastatin (CRESTOR) 40 MG tablet TAKE 1 TABLET BY MOUTH EVERY DAY     Review of Systems      All other systems reviewed and are otherwise negative except as noted above.  Physical Exam    VS:  BP 134/60   Pulse 74   Ht 5' 6"$  (1.676 m)   Wt 164 lb (74.4 kg)   BMI 26.47 kg/m  , BMI Body mass index is 26.47 kg/m.  Wt Readings from Last 3 Encounters:  12/07/22 164 lb (74.4 kg)  11/21/22 160 lb 12.8 oz (72.9 kg)  11/08/22 160 lb 6.4 oz (72.8 kg)    GEN: Well nourished, well developed, in no acute distress. HEENT: normal. Neck: Supple, no JVD, carotid bruits, or masses. Cardiac: RRR, no rubs, or gallops. Gr 2 systolic murmur. No clubbing, cyanosis, edema.  Radials/PT 2+ and equal bilaterally.  Respiratory:  Respirations regular and unlabored, clear to auscultation bilaterally. GI: Soft, nontender, nondistended. MS: No deformity or atrophy. Skin: Warm and dry, no rash. Neuro:  Strength and sensation are intact. Psych: Normal affect.  Assessment & Plan    Aortic stenosis s/p TAVR-echo 09/2021 normal LVEF, mild AI. Repeat echo 09/2022 normal LVEF, gr1DD, trivial paravalvular AI with overall normal function. Continue SBE  prophylaxis amoxicillin. Continue optimal BP control.  Consider repeat echo 09/2023  Lower extremity edema- Controlled on HCTZ and PRN Lasix. Encouraged leg elevation, compressions stockings.   CAD-nonobstructive CAD on coronary CTA 2018.  Card catheterization January 2020 with normal coronary anatomy. Stable with no anginal symptoms. No indication for ischemic evaluation.  GDMT aspirin, Rosuvastatin, Toprol. Heart healthy diet and regular cardiovascular exercise encouraged.    Hypomagnesia - On supplementation.   Hyperlipidemia/carotid artery stenosis-CT angio neck 02/2021 with right ICA 60 to 70% stenosis, left ICA without hemodynamically significant stenosis.Overdue for follow up with [facility]. She was provided   Hypertension- BP well controlled. Continue current antihypertensive regimen.  Refer to PREP exercise program.  Paroxysmal SVT- Palpitations quiescent on metoprolol.   Thyroid nodules- Has been referred to endocrinology as one of her nodules met criteria for FNA. Has appointment with endocrinology later this month.   Lung nodules - Follows with pulmonology.     Disposition: Follow up  in 4-6 months  with [name], MD or APP.  Signed, Loel Dubonnet, NP 12/07/2022, 4:44 PM [place]

## 2022-12-14 ENCOUNTER — Telehealth: Payer: Self-pay

## 2022-12-14 NOTE — Telephone Encounter (Signed)
Called re: PREP program referral, left voicemail

## 2022-12-19 DIAGNOSIS — I10 Essential (primary) hypertension: Secondary | ICD-10-CM | POA: Diagnosis not present

## 2022-12-19 DIAGNOSIS — E042 Nontoxic multinodular goiter: Secondary | ICD-10-CM | POA: Diagnosis not present

## 2022-12-20 ENCOUNTER — Telehealth: Payer: Self-pay

## 2022-12-20 ENCOUNTER — Other Ambulatory Visit: Payer: Self-pay | Admitting: Cardiovascular Disease

## 2022-12-20 NOTE — Telephone Encounter (Signed)
Rx request sent to pharmacy.  

## 2022-12-20 NOTE — Telephone Encounter (Signed)
Called to discuss PREP program, would like to attend April T/Th class at [facility] 12-1:15; will contact in March to confirm start date and set up assessment visit.

## 2022-12-26 ENCOUNTER — Telehealth: Payer: Self-pay | Admitting: Pulmonary Disease

## 2022-12-26 DIAGNOSIS — J449 Chronic obstructive pulmonary disease, unspecified: Secondary | ICD-10-CM

## 2022-12-26 NOTE — Telephone Encounter (Signed)
Patient called to inform the doctor that the [facility] samples she got is working and she would like a script sent to her pharmacy.  Please advise.  CB# [phone]

## 2022-12-27 NOTE — Telephone Encounter (Signed)
Called patient but she did not answer. Left message for her to call back.  

## 2022-12-27 NOTE — Telephone Encounter (Signed)
Patient is returning a call to the nurse.  She would like a call back at a different #.  CB# [phone]

## 2023-01-02 MED ORDER — BREZTRI AEROSPHERE 160-9-4.8 MCG/ACT IN AERO: 2.0000 | INHALATION_SPRAY | Freq: Two times a day (BID) | RESPIRATORY_TRACT | 5 refills | Status: DC

## 2023-01-02 NOTE — Telephone Encounter (Signed)
PT ret [facility] call . Please see last not for best CB #.

## 2023-01-02 NOTE — Telephone Encounter (Signed)
Called and spoke with pt to verify which pharmacy she wanted the inhaler to be sent to and have sent the [facility] inhaler to pharmacy for her. Nothing further needed.

## 2023-01-07 ENCOUNTER — Other Ambulatory Visit: Payer: Self-pay | Admitting: *Deleted

## 2023-01-07 MED ORDER — AMOXICILLIN 500 MG PO TABS: ORAL_TABLET | ORAL | 3 refills | Status: DC

## 2023-01-08 ENCOUNTER — Telehealth: Payer: Self-pay | Admitting: Pulmonary Disease

## 2023-01-08 DIAGNOSIS — J449 Chronic obstructive pulmonary disease, unspecified: Secondary | ICD-10-CM

## 2023-01-08 NOTE — Telephone Encounter (Signed)
Patient states pharmacy does not have the RX for [employer]. Pharmacy is [facility]. Patient phone number is [phone].

## 2023-01-09 MED ORDER — BREZTRI AEROSPHERE 160-9-4.8 MCG/ACT IN AERO: 2.0000 | INHALATION_SPRAY | Freq: Two times a day (BID) | RESPIRATORY_TRACT | 11 refills | Status: DC

## 2023-01-09 NOTE — Telephone Encounter (Signed)
Sent Breztri to pharmacy. Original order was never sent to pharmacy. Called pt to inform her. nothing further needed.

## 2023-01-10 ENCOUNTER — Other Ambulatory Visit: Payer: Self-pay | Admitting: Endocrinology

## 2023-01-10 DIAGNOSIS — E042 Nontoxic multinodular goiter: Secondary | ICD-10-CM

## 2023-01-14 ENCOUNTER — Ambulatory Visit (HOSPITAL_BASED_OUTPATIENT_CLINIC_OR_DEPARTMENT_OTHER): Payer: Medicare Other | Admitting: Cardiovascular Disease

## 2023-01-22 ENCOUNTER — Telehealth: Payer: Self-pay

## 2023-01-22 NOTE — Telephone Encounter (Signed)
Called to confirm participation in April PREP Class at [employer], left voicemail.

## 2023-01-23 ENCOUNTER — Telehealth: Payer: Self-pay

## 2023-01-23 NOTE — Telephone Encounter (Signed)
Returned her call, left voicemail ?

## 2023-01-23 NOTE — Telephone Encounter (Signed)
She returned my call, plans to attend next PREP class at [place] on April 9, every T/Th 12-1:15; assessment visit scheduled for 4/4 at 12 noon.

## 2023-01-31 NOTE — Progress Notes (Signed)
Arrived for assessment visit today for class that starts 02/05/23, she has 2 procedures scheduled on the 16th and 23rd and won't be able to attend class those days. Have her re-scheduled to start PREP in May, will contact her 1st week of May to re-schedule assessment visit.

## 2023-02-06 ENCOUNTER — Other Ambulatory Visit: Payer: Medicare Other

## 2023-02-06 ENCOUNTER — Ambulatory Visit
Admission: RE | Admit: 2023-02-06 | Discharge: 2023-02-06 | Disposition: A | Discharge status: Home / Self Care | Payer: Medicare Other | Source: Ambulatory Visit | Attending: Endocrinology | Admitting: Endocrinology

## 2023-02-06 ENCOUNTER — Other Ambulatory Visit (HOSPITAL_COMMUNITY)
Admission: RE | Admit: 2023-02-06 | Discharge: 2023-02-06 | Disposition: A | Discharge status: Home / Self Care | Payer: Medicare Other | Source: Ambulatory Visit | Attending: Endocrinology | Admitting: Endocrinology

## 2023-02-06 DIAGNOSIS — E041 Nontoxic single thyroid nodule: Secondary | ICD-10-CM

## 2023-02-06 DIAGNOSIS — E042 Nontoxic multinodular goiter: Secondary | ICD-10-CM

## 2023-02-08 LAB — CYTOLOGY - NON PAP

## 2023-02-12 ENCOUNTER — Ambulatory Visit (HOSPITAL_BASED_OUTPATIENT_CLINIC_OR_DEPARTMENT_OTHER)
Admission: RE | Admit: 2023-02-12 | Discharge: 2023-02-12 | Disposition: A | Discharge status: Home / Self Care | Payer: Medicare Other | Source: Ambulatory Visit | Attending: Acute Care | Admitting: Acute Care

## 2023-02-12 DIAGNOSIS — R918 Other nonspecific abnormal finding of lung field: Secondary | ICD-10-CM | POA: Diagnosis not present

## 2023-02-12 DIAGNOSIS — J439 Emphysema, unspecified: Secondary | ICD-10-CM | POA: Diagnosis not present

## 2023-02-15 ENCOUNTER — Encounter: Payer: Self-pay | Admitting: Acute Care

## 2023-02-15 ENCOUNTER — Ambulatory Visit (INDEPENDENT_AMBULATORY_CARE_PROVIDER_SITE_OTHER): Payer: Medicare Other | Admitting: Acute Care

## 2023-02-15 VITALS — BP 128/82 | HR 74 | Temp 97.8°F | Ht 65.0 in | Wt 160.4 lb

## 2023-02-15 DIAGNOSIS — R911 Solitary pulmonary nodule: Secondary | ICD-10-CM | POA: Diagnosis not present

## 2023-02-15 DIAGNOSIS — J449 Chronic obstructive pulmonary disease, unspecified: Secondary | ICD-10-CM | POA: Diagnosis not present

## 2023-02-15 NOTE — Progress Notes (Signed)
History of Present Illness Dominique [name] is a 82 y.o. female former smoker , quit 2005 a 60 pack year smoking history. She has a PMH of COPD, atrial fibrillation, TAVR placement by Dr. Excell Seltzer 2021,  hypertension  and moderate pulmonary hypertension.She was referred to Dr. Tonia Brooms for evaluation of the Pulmonary Nodule April 2021 by Willow Ora MD.  Synopsis 82 year old female with a past medical history of COPD, atrial fibrillation, TAVR placement by Dr. Excell Seltzer last year, hypertension moderate pulmonary hypertension. Patient had CT imaging of the chest which revealed multiple bilateral lung nodules. Some of which had groundglass areas others had in the left upper lobe adjacent to the pleura more solid component. Patient underwent PET scan imaging which revealed left upper lobe nodule with an SUV of 10. Other groundglass areas had low-level PET uptake. There was some concern for potential infectious etiology. Therefore patient was referred to pulmonary for recommendations and management. She used to be a former smoker quit many years ago. 60-pack-year history quit in 2005.    02/15/2023  Test Results: 01/14/2020: CT chest Subsolid 2.4 cm pulmonary nodule concerning for potential malignancy scattered groundglass nodule within the right upper lobe.   01/27/2020 nuclear medicine pet imaging abnormal PET activity within the left upper lobe does not go above mediastinal blood pool however does not exclude low-grade adenocarcinoma. Also has clustered nodularity within the left upper lobe concerning for potential inflammatory disorder.  Or infection.    05/02/2021 CT chest: Bilateral mixed solid and subsolid groundglass opacities both within the upper lobes.  The left upper lobe medial subpleural nodule is still present. In comparison to previous PET scan results of this lesion is still there since March 2021 was PET avid thn.   May 26 2021 PET/CT: Low-level uptake within these have solid lesions within  the chest.  Reviewed images with patient. The patient's images have been independently reviewed by me.    Super D CT Chest 06/13/2022 Unchanged appearance of bilateral upper lobe subsolid and ground-glass nodules. One year interval follow-up to complete 5 years total follow-up based on current recommendations may be helpful as these could represent small areas of indolent pulmonary neoplasm. Pulmonary emphysema as before. Dilated main pulmonary arteries up to 3.6 cm in diameter. Findings suggestive of pulmonary arterial hypertension. Post TAVR. Aortic atherosclerosis.   Super D Chest 02/12/2023>> Personally reviewed by me>> I have reviewed results with patient today in the office  Mediastinum/Nodes: Hypodense bilateral thyroid nodules up to 1.3 cm on the left, unchanged. Unremarkable esophagus. No pathologically enlarged axillary, mediastinal or hilar lymph nodes, noting limited sensitivity for the detection of hilar adenopathy on this noncontrast study.   Lungs/Pleura: No pneumothorax. No pleural effusion. Mild centrilobular emphysema with diffuse bronchial wall thickening. Subsolid 2.9 x 1.2 cm posterior left upper lobe pulmonary nodule with 0.4 cm solid component abutting and distorting the left major fissure (series 4/image 43), previously 2.9 x 1.2 cm with 0.4 cm solid component using similar measurement technique and unchanged since most recent 07/17/2022 chest CT, slightly increased from 2.4 x 0.9 cm with 0.2 cm solid component on baseline 12/24/2016 chest CT. Several ground-glass pulmonary nodules scattered throughout the right lung, largest 1.9 cm in the posterior right upper lobe (series 4/image 56), all stable. Indistinct new subsolid 0.6 cm nodule in the peripheral left lower lobe (series 4/image 115). No additional new significant pulmonary nodules.   Upper abdomen: Simple 2.3 cm posterior upper right renal cyst, for which no follow-up imaging is recommended.  Musculoskeletal: No aggressive appearing focal osseous lesions. Mild thoracic spondylosis.   IMPRESSION: 1. Subsolid 2.9 cm posterior left upper lobe pulmonary nodule with 0.4 cm solid component abutting and distorting the left major fissure, unchanged since most recent 07/17/2022 chest CT, slightly increased from 2.4 x 0.9 cm with 0.2 cm solid component on baseline 12/24/2016 chest CT. Very low-grade/indolent primary bronchogenic adenocarcinoma cannot be excluded. 2. Indistinct new subsolid 0.6 cm peripheral left lower lobe pulmonary nodule, potentially inflammatory. Suggest attention on follow-up noncontrast chest CT in 3-6 months. 3. Several ground-glass pulmonary nodules scattered throughout the right lung, largest 1.9 cm in the posterior right upper lobe, all stable. Noncontrast chest CT follow-up advised in 2 years for these nodules. 4. One vessel coronary atherosclerosis. 5. Chronic dilated right and left pulmonary arteries, suggesting chronic pulmonary arterial hypertension. 6. Mild centrilobular emphysema with diffuse bronchial wall thickening, compatible with reported COPD.  Pulmonary Functions Testing Results:     Latest Ref Rng & Units 12/09/2018    2:04 PM 06/18/2016    1:36 PM  PFT Results  FVC-Pre L 1.77  2.08   FVC-Predicted Pre % 62  72   FVC-Post L 2.17  2.35   FVC-Predicted Post % 77  81   Pre FEV1/FVC % % 60  53   Post FEV1/FCV % % 49  53   FEV1-Pre L 1.06  1.11   FEV1-Predicted Pre % 50  51   FEV1-Post L 1.07  1.25   DLCO uncorrected ml/min/mmHg 8.82  10.71   DLCO UNC% % 45  41   DLVA Predicted % 63  60   TLC L 5.43  4.69   TLC % Predicted % 104  90   RV % Predicted % 145  99           Latest Ref Rng & Units 10/17/2022   11:13 AM 03/29/2022   11:04 AM 05/20/2021   11:46 AM  CBC  WBC 4.0 - 10.5 K/uL 8.7  6.9  11.0   Hemoglobin 12.0 - 15.0 g/dL 16.1  09.6  04.5   Hematocrit 36.0 - 46.0 % 42.3  42.1  43.5   Platelets 150.0 - 400.0 K/uL 299.0   239.0  238        Latest Ref Rng & Units 10/17/2022   11:13 AM 03/29/2022   11:04 AM 09/28/2021    9:37 AM  BMP  Glucose 70 - 99 mg/dL 87  86  87   BUN 6 - 23 mg/dL Creatinine 0.40 - 1.20 mg/dL 4.09  8.11  9.14   Sodium 135 - 145 mEq/L 141  140  142   Potassium 3.5 - 5.1 mEq/L 3.9  4.0  3.7   Chloride 96 - 112 mEq/L 99  99  103   CO2 19 - 32 mEq/L 32  32  33   Calcium 8.4 - 10.5 mg/dL 9.4  9.6  9.5     BNP    Component Value Date/Time   BNP 67.0 09/02/2020 1237   BNP 65.3 12/26/2018 1015    ProBNP No results found for: "PROBNP"  PFT    Component Value Date/Time   FEV1PRE 1.06 12/09/2018 1404   FEV1POST 1.07 12/09/2018 1404   FVCPRE 1.77 12/09/2018 1404   FVCPOST 2.17 12/09/2018 1404   TLC 5.43 12/09/2018 1404   DLCOUNC 8.82 12/09/2018 1404   PREFEV1FVCRT 60 12/09/2018 1404   PSTFEV1FVCRT 49 12/09/2018 1404    CT Super D  Chest Wo Contrast  Result Date: 02/14/2023 CLINICAL DATA:  Follow-up pulmonary nodules.  COPD. EXAM: CT CHEST WITHOUT CONTRAST TECHNIQUE: Multidetector CT imaging of the chest was performed using thin slice collimation for electromagnetic bronchoscopy planning purposes, without intravenous contrast. RADIATION DOSE REDUCTION: This exam was performed according to the departmental dose-optimization program which includes automated exposure control, adjustment of the mA and/or kV according to patient size and/or use of iterative reconstruction technique. COMPARISON:  07/16/2022 chest CT. FINDINGS: Cardiovascular: Normal heart size. No significant pericardial effusion/thickening. Left anterior descending coronary atherosclerosis. TAVR in place. Atherosclerotic thoracic aorta with dilated 4.0 cm ascending thoracic aorta. Chronic dilated right and left pulmonary arteries. Mediastinum/Nodes: Hypodense bilateral thyroid nodules up to 1.3 cm on the left, unchanged. Unremarkable esophagus. No pathologically enlarged axillary, mediastinal or hilar lymph  nodes, noting limited sensitivity for the detection of hilar adenopathy on this noncontrast study. Lungs/Pleura: No pneumothorax. No pleural effusion. Mild centrilobular emphysema with diffuse bronchial wall thickening. Subsolid 2.9 x 1.2 cm posterior left upper lobe pulmonary nodule with 0.4 cm solid component abutting and distorting the left major fissure (series 4/image 43), previously 2.9 x 1.2 cm with 0.4 cm solid component using similar measurement technique and unchanged since most recent 07/17/2022 chest CT, slightly increased from 2.4 x 0.9 cm with 0.2 cm solid component on baseline 12/24/2016 chest CT. Several ground-glass pulmonary nodules scattered throughout the right lung, largest 1.9 cm in the posterior right upper lobe (series 4/image 56), all stable. Indistinct new subsolid 0.6 cm nodule in the peripheral left lower lobe (series 4/image 115). No additional new significant pulmonary nodules. Upper abdomen: Simple 2.3 cm posterior upper right renal cyst, for which no follow-up imaging is recommended. Musculoskeletal: No aggressive appearing focal osseous lesions. Mild thoracic spondylosis. IMPRESSION: 1. Subsolid 2.9 cm posterior left upper lobe pulmonary nodule with 0.4 cm solid component abutting and distorting the left major fissure, unchanged since most recent 07/17/2022 chest CT, slightly increased from 2.4 x 0.9 cm with 0.2 cm solid component on baseline 12/24/2016 chest CT. Very low-grade/indolent primary bronchogenic adenocarcinoma cannot be excluded. 2. Indistinct new subsolid 0.6 cm peripheral left lower lobe pulmonary nodule, potentially inflammatory. Suggest attention on follow-up noncontrast chest CT in 3-6 months. 3. Several ground-glass pulmonary nodules scattered throughout the right lung, largest 1.9 cm in the posterior right upper lobe, all stable. Noncontrast chest CT follow-up advised in 2 years for these nodules. 4. One vessel coronary atherosclerosis. 5. Chronic dilated right and  left pulmonary arteries, suggesting chronic pulmonary arterial hypertension. 6. Mild centrilobular emphysema with diffuse bronchial wall thickening, compatible with reported COPD. 7. Aortic Atherosclerosis (ICD10-I70.0) and Emphysema (ICD10-J43.9). Electronically Signed   By: Delbert Phenix M.D.   On: 02/14/2023 22:17   [place] FNA BX THYROID 1ST LESION AFIRMA  Result Date: 02/06/2023 INDICATION: Indeterminate thyroid nodule EXAM: ULTRASOUND GUIDED FINE NEEDLE ASPIRATION OF INDETERMINATE THYROID NODULE COMPARISON:  None Available. MEDICATIONS: None COMPLICATIONS: None immediate. TECHNIQUE: Informed written consent was obtained from the patient after a discussion of the risks, benefits and alternatives to treatment. Questions regarding the procedure were encouraged and answered. A timeout was performed prior to the initiation of the procedure. Pre-procedural ultrasound scanning demonstrated unchanged size and appearance of the indeterminate nodule within the mid left thyroid lobe. The procedure was planned. The neck was prepped in the usual sterile fashion, and a sterile drape was applied covering the operative field. A timeout was performed prior to the initiation of the procedure. Local anesthesia was provided with 1% lidocaine.  Under direct ultrasound guidance, 6 FNA biopsies were performed of the mid left thyroid lobe nodule with a 25 gauge needle. Multiple ultrasound images were saved for procedural documentation purposes. The samples were prepared and submitted to pathology. Limited post procedural scanning was negative for hematoma or additional complication. Dressings were placed. The patient tolerated the above procedures procedure well without immediate postprocedural complication. FINDINGS: Nodule reference number based on prior diagnostic ultrasound: 5 Maximum size: 1.8 cm Location: Left; mid ACR TI-RADS risk category: TR4 (4-6 points) Reason for biopsy: meets ACR TI-RADS criteria Ultrasound imaging confirms  appropriate placement of the needles within the thyroid nodule. IMPRESSION: Technically successful ultrasound guided fine needle aspiration of left mid thyroid nodule. Electronically Signed   By: Acquanetta Belling M.D.   On: 02/06/2023 15:53     Past medical hx Past Medical History:  Diagnosis Date   Atrial fibrillation    a. dx 03/2019 while in hospital with cholecystitis   COPD (chronic obstructive pulmonary disease)    Critical lower limb ischemia    a. after TAVR developed ischemic L leg likely due to flap/closure, s/p emergent repair of left femoral artery with endarterectomy and Dacron patch angioplasty.   Emphysema lung    Hyperlipidemia    LDL goal < 70   Hypertension    Hypomagnesemia    Normal coronary arteries 10/2018   NSVT (nonsustained ventricular tachycardia) 03/02/2019   Pulmonary HTN 06/05/2016   Moderate with PASP by echo 04/2016 likely Group 3 from COPD and possibly Group 2 from pulmonary venous HTN associated with moderate AS - f/u echo 12/2018 post TAVR showed normal RVSP   Pulmonary nodules    seen on pre TAVR CT, needs 12 month CT follow up   S/P TAVR (transcatheter aortic valve replacement) 12/30/2018   Medtronic Evolut Pro-Plus THV (size 26 mm, serial # [id number]) via the TF approach   Sacral fracture    Severe aortic stenosis    a. s/p TAVR 12/2018.   Thoracic ascending aortic aneurysm    needs yearly follow up   Thyroid nodule 11/08/2022     Social History   Tobacco Use   Smoking status: Former    Packs/day: 1.50    Years: 40.00    Additional pack years: 0.00    Total pack years: 60.00    Types: Cigarettes    Start date: 73    Quit date: 03/19/2004    Years since quitting: 18.9   Smokeless tobacco: Never   Tobacco comments:    Started smoking at age 59  Vaping Use   Vaping Use: Never used  Substance Use Topics   Alcohol use: Yes    Alcohol/week: 3.0 - 4.0 standard drinks of alcohol    Types: 3 - 4 Glasses of wine per week    Comment:  socially   Drug use: No    Ms.Meints reports that she quit smoking about 18 years ago. Her smoking use included cigarettes. She started smoking about 67 years ago. She has a 60.00 pack-year smoking history. She has never used smokeless tobacco. She reports current alcohol use of about 3.0 - 4.0 standard drinks of alcohol per week. She reports that she does not use drugs.  Tobacco Cessation: Former smoker , Quit 2005 with a 60 pack year smoking history   Past surgical hx, Family hx, Social hx all reviewed.  Current Outpatient Medications on File Prior to Visit  Medication Sig   acetaminophen (TYLENOL) 325 MG tablet Take 2 tablets (  650 mg total) by mouth every 6 (six) hours as needed for mild pain (or Fever >/= 101).   albuterol (VENTOLIN HFA) 108 (90 Base) MCG/ACT inhaler Inhale 2 puffs into the lungs every 6 (six) hours as needed for wheezing or shortness of breath.   amLODipine (NORVASC) 5 MG tablet TAKE 1 TABLET (5 MG TOTAL) BY MOUTH DAILY.   amoxicillin (AMOXIL) 500 MG tablet Take 4 tablets (2,000mg ) one hour prior to all dental visits.   aspirin EC 81 MG tablet Take 81 mg by mouth daily.   Budeson-Glycopyrrol-Formoterol (BREZTRI AEROSPHERE) 160-9-4.8 MCG/ACT AERO Inhale 2 puffs into the lungs in the morning and at bedtime.   Calcium Carb-Cholecalciferol (CALCIUM 600 + D PO) Take 600 mg by mouth 3 (three) times a week.    Cholecalciferol (VITAMIN D3) 5000 UNITS CAPS Take 5,000 Units by mouth daily.    denosumab (PROLIA) 60 MG/ML SOLN injection Inject 60 mg into the skin every 6 (six) months. Administer in upper arm, thigh, or abdomen   EPINEPHrine 0.3 mg/0.3 mL IJ SOAJ injection Inject 0.3 mg into the muscle as needed for anaphylaxis.   furosemide (LASIX) 20 MG tablet DAILY AS NEEDED FOR SWELLING   hydrochlorothiazide (MICROZIDE) 12.5 MG capsule TAKE 1 CAPSULE BY MOUTH EVERY DAY   loratadine (CLARITIN) 10 MG tablet Take 10 mg by mouth daily as needed for allergies.    magnesium oxide  (MAG-OX) 400 (240 Mg) MG tablet Take 1 tablet (400 mg total) by mouth daily.   metoprolol succinate (TOPROL-XL) 25 MG 24 hr tablet TAKE 1 TABLET BY MOUTH EVERY DAY   Multiple Vitamin (MULTIVITAMIN WITH MINERALS) TABS tablet Take 1 tablet by mouth daily.   rosuvastatin (CRESTOR) 40 MG tablet TAKE 1 TABLET BY MOUTH EVERY DAY   No current facility-administered medications on file prior to visit.     No Known Allergies  Review Of Systems:  Constitutional:   No  weight loss, night sweats,  Fevers, chills, fatigue, or  lassitude.  HEENT:   No headaches,  Difficulty swallowing,  Tooth/dental problems, or  Sore throat,                No sneezing, itching, ear ache, nasal congestion, post nasal drip,   CV:  No chest pain,  Orthopnea, PND, swelling in lower extremities, anasarca, dizziness, palpitations, syncope.   GI  No heartburn, indigestion, abdominal pain, nausea, vomiting, diarrhea, change in bowel habits, loss of appetite, bloody stools.   Resp: No shortness of breath with exertion or at rest.  No excess mucus, no productive cough,  No non-productive cough,  No coughing up of blood.  No change in color of mucus.  No wheezing.  No chest wall deformity  Skin: no rash or lesions.  GU: no dysuria, change in color of urine, no urgency or frequency.  No flank pain, no hematuria   MS:  No joint pain or swelling.  No decreased range of motion.  No back pain.  Psych:  No change in mood or affect. No depression or anxiety.  No memory loss.   Vital Signs BP 128/82   Pulse 74   Temp 97.8 F (36.6 C) (Oral)   Ht 5\' 5"  (1.651 m)   Wt 160 lb 6.4 oz (72.8 kg)   SpO2 96%   BMI 26.69 kg/m    Physical Exam:  General- No distress,  A&Ox3, pleasant ENT: No sinus tenderness, TM clear, pale nasal mucosa, no oral exudate,no post nasal drip, no LAN Cardiac: S1,  S2, regular rate and rhythm, no murmur Chest: No wheeze/ rales/ dullness; no accessory muscle use, no nasal flaring, no sternal  retractions Abd.: Soft Non-tender, ND,. BS +, Body mass index is 26.69 kg/m.  Ext: No clubbing cyanosis, edema Neuro:  normal strength, MAE x 4, A&O x 3 Skin: No rashes, warm and dry, no lesions Psych: normal mood and behavior   Assessment/Plan Stable left upper lobe nodule  New 0.6 cm node in the LLL Former smoker COPD Plan Your CT Chows that the nodule we have been watching in your left upper lobe is stable since September 2023. There is however a new nodule in the left lower lobe that is 0.6 cm. We will recheck in 3 months for stability with a CT Chest  We will schedule that for July 2024. You will get a call to get this scheduled.  I will give you financial assistance paperwork for the Breztri  since the Markus Daft is so expensive for you with your insurance We will give you samples to try today Take 2 puffs twice daily  Rinse mouth after use Call if you do not get financial assistance so we can try Trelegy Follow up in 3 months after CT chest without contrast. Please contact office for sooner follow up if symptoms do not improve or worsen or seek emergency care   . I spent 33 minutes dedicated to the care of this patient on the date of this encounter to include pre-visit review of records, face-to-face time with the patient discussing conditions above, post visit ordering of testing, clinical documentation with the electronic health record, making appropriate referrals as documented, and communicating necessary information to the patient's healthcare team.     Bevelyn Ngo, NP 02/15/2023  9:24 AM

## 2023-02-15 NOTE — Patient Instructions (Addendum)
It is good to see you today. Your CT Chows that the nodule we have been watching in your left upper lobe is stable since September 2023. There is however a new nodule in the left lower lobe that is 0.6 cm. We will recheck in 3 months for stability with a CT Chest  We will schedule that for July 2024. You will get a call to get this scheduled.  I will give you financial assistance paperwork for the Breztri  since the Markus Daft is so expensive for you with your insurance We will give you samples to try today Take 2 puffs twice daily  Rinse mouth after use Call if you do not get financial assistance so we can try Trelegy Follow up in 3 months after CT chest without contrast. Please contact office for sooner follow up if symptoms do not improve or worsen or seek emergency care   .

## 2023-02-20 DIAGNOSIS — L304 Erythema intertrigo: Secondary | ICD-10-CM | POA: Diagnosis not present

## 2023-02-20 DIAGNOSIS — Z87892 Personal history of anaphylaxis: Secondary | ICD-10-CM | POA: Diagnosis not present

## 2023-02-20 DIAGNOSIS — L111 Transient acantholytic dermatosis [Grover]: Secondary | ICD-10-CM | POA: Diagnosis not present

## 2023-02-20 DIAGNOSIS — L281 Prurigo nodularis: Secondary | ICD-10-CM | POA: Diagnosis not present

## 2023-03-01 ENCOUNTER — Telehealth: Payer: Self-pay

## 2023-03-01 NOTE — Telephone Encounter (Signed)
Called to confirm participation in next PREP class at [facility] on May 14; assessment visit scheduled for Monday May 6 at 2pm

## 2023-03-04 NOTE — Progress Notes (Signed)
[facility] PREP Evaluation  Patient Details  Name: Dominique [name] MRN: [id number] Date of Birth: 18-Feb-1941 Age: 82 y.o. PCP: Wanda Plump, MD  Vitals:   03/04/23 1442  BP: (!) 146/50  Pulse: 99  SpO2: 98%  Weight: 162 lb 12.8 oz (73.8 kg)     [facility] Eval - 03/04/23 1400       [facility] "PREP" Location   [facility] "PREP" Location [facility]      Referral    Referring Provider C. Walker    Reason for referral Hypertension;Inactivity    Program Start Date 03/12/23      Measurement   Waist Circumference 37.5 inches    Hip Circumference 42.5 inches    Body fat 45.1 percent      Information for Trainer   Goals --   Establish exercise routine to increase stamina; lose 5-10 pounds by end of program   Current Exercise --   walking dog   Orthopedic Concerns --   s/p Bilat THR   Pertinent Medical History --   HTN, CAD, TAVR 3/20, COPD   Medications that affect exercise Beta blocker      Timed Up and Go (TUGS)   Timed Up and Go Low risk <9 seconds      Mobility and Daily Activities   I find it easy to walk up or down two or more flights of stairs. 1    I have no trouble taking out the trash. 4    I do housework such as vacuuming and dusting on my own without difficulty. 4    I can easily lift a gallon of milk (8lbs). 4    I can easily walk a mile. 2    I have no trouble reaching into high cupboards or reaching down to pick up something from the floor. 4    I do not have trouble doing out-door work such as Loss adjuster, chartered, raking leaves, or gardening. 3      Mobility and Daily Activities   I feel younger than my age. 4    I feel independent. 4    I feel energetic. 2    I live an active life.  4    I feel strong. 1    I feel healthy. 2    I feel active as other people my age. 4      How fit and strong are you.   Fit and Strong Total Score 43            Past Medical History:  Diagnosis Date   Atrial fibrillation (HCC)    a. dx 03/2019 while in hospital with cholecystitis    COPD (chronic obstructive pulmonary disease) (HCC)    Critical lower limb ischemia (HCC)    a. after TAVR developed ischemic L leg likely due to flap/closure, s/p emergent repair of left femoral artery with endarterectomy and Dacron patch angioplasty.   Emphysema lung (HCC)    Hyperlipidemia    LDL goal < 70   Hypertension    Hypomagnesemia    Normal coronary arteries 10/2018   NSVT (nonsustained ventricular tachycardia) (HCC) 03/02/2019   Pulmonary HTN (HCC) 06/05/2016   Moderate with PASP by echo 04/2016 likely Group 3 from COPD and possibly Group 2 from pulmonary venous HTN associated with moderate AS - f/u echo 12/2018 post TAVR showed normal RVSP   Pulmonary nodules    seen on pre TAVR CT, needs 12 month CT follow up  S/P TAVR (transcatheter aortic valve replacement) 12/30/2018   Medtronic Evolut Pro-Plus THV (size 26 mm, serial # [id number]) via the TF approach   Sacral fracture (HCC)    Severe aortic stenosis    a. s/p TAVR 12/2018.   Thoracic ascending aortic aneurysm (HCC)    needs yearly follow up   Thyroid nodule 11/08/2022   Past Surgical History:  Procedure Laterality Date   APPENDECTOMY  1951   CATARACT EXTRACTION     CESAREAN SECTION     1971   CHOLECYSTECTOMY N/A 04/28/2019   Procedure: LAPAROSCOPIC CHOLECYSTECTOMY;  Surgeon: Almond Lint, MD;  Location: [facility];  Service: General;  Laterality: N/A;   COLONOSCOPY  2018   FEMORAL-POPLITEAL BYPASS GRAFT Left 12/31/2018   Procedure: PATCH ANGIOPLASTY REPAIR LEFT FEMORAL ARTERY USING HEMASHIELD PLATINUM FINESSE PATCH;  Surgeon: Larina Earthly, MD;  Location: [facility];  Service: Vascular;  Laterality: Left;   IR RADIOLOGY PERIPHERAL GUIDED IV START  05/30/2017   IR RADIOLOGY PERIPHERAL GUIDED IV START  09/02/2018   IR RADIOLOGY PERIPHERAL GUIDED IV START  09/02/2018   IR US GUIDE VASC ACCESS LEFT  09/02/2018   IR US GUIDE VASC ACCESS RIGHT  05/30/2017   IR US GUIDE VASC ACCESS RIGHT  09/02/2018   JOINT REPLACEMENT  2016    sacroplasty   LAMINECTOMY     RIGHT/LEFT HEART CATH AND CORONARY ANGIOGRAPHY N/A 11/27/2018   Procedure: RIGHT/LEFT HEART CATH AND CORONARY ANGIOGRAPHY;  Surgeon: Tonny Bollman, MD;  Location: [facility] INVASIVE CV LAB;  Service: Cardiovascular;  Laterality: N/A;   TEE WITHOUT CARDIOVERSION N/A 12/30/2018   Procedure: TRANSESOPHAGEAL ECHOCARDIOGRAM (TEE);  Surgeon: Tonny Bollman, MD;  Location: [facility] INVASIVE CV LAB;  Service: Open Heart Surgery;  Laterality: N/A;   TOTAL HIP ARTHROPLASTY Bilateral    TRANSCATHETER AORTIC VALVE REPLACEMENT, TRANSFEMORAL N/A 12/30/2018   Procedure: TRANSCATHETER AORTIC VALVE REPLACEMENT, TRANSFEMORAL;  Surgeon: Tonny Bollman, MD;  Location: [facility]VAS[facility] CV LAB;  Service: Open Heart Surgery;  Laterality: N/A;   TUBAL LIGATION  1975   Social History   Tobacco Use  Smoking Status Former   Packs/day: 1.50   Years: 40.00   Additional pack years: 0.00   Total pack years: 60.00   Types: Cigarettes   Start date: 20   Quit date: 03/19/2004   Years since quitting: 18.9  Smokeless Tobacco Never  Tobacco Comments   Started smoking at age 81  To begin PREP program at [facility] on May 14, every T/Th 2-3:15  Sonia Baller 03/04/2023, 2:45 PM

## 2023-03-05 ENCOUNTER — Telehealth: Payer: Self-pay

## 2023-03-05 NOTE — Telephone Encounter (Signed)
Pt due for next Prolia 03/27/23- insurance benefits ran?

## 2023-03-05 NOTE — Telephone Encounter (Signed)
Can we please run benefits.

## 2023-03-06 ENCOUNTER — Encounter: Payer: Self-pay | Admitting: Internal Medicine

## 2023-03-06 ENCOUNTER — Ambulatory Visit (INDEPENDENT_AMBULATORY_CARE_PROVIDER_SITE_OTHER): Payer: Medicare Other | Admitting: Internal Medicine

## 2023-03-06 VITALS — BP 136/74 | HR 92 | Temp 97.6°F | Resp 18 | Ht 65.0 in | Wt 163.1 lb

## 2023-03-06 DIAGNOSIS — I10 Essential (primary) hypertension: Secondary | ICD-10-CM | POA: Diagnosis not present

## 2023-03-06 DIAGNOSIS — J438 Other emphysema: Secondary | ICD-10-CM | POA: Diagnosis not present

## 2023-03-06 LAB — BASIC METABOLIC PANEL
BUN: 17 mg/dL (ref 6–23)
CO2: 29 mEq/L (ref 19–32)
Calcium: 10 mg/dL (ref 8.4–10.5)
Chloride: 100 mEq/L (ref 96–112)
Creatinine, Ser: 0.8 mg/dL (ref 0.40–1.20)
GFR: 68.41 mL/min (ref >60.00)
Glucose, Bld: 94 mg/dL (ref 70–99)
Potassium: 3.7 mEq/L (ref 3.5–5.1)
Sodium: 140 mEq/L (ref 135–145)

## 2023-03-06 NOTE — Progress Notes (Signed)
Subjective:    Patient ID: Dominique [name], female    DOB: [birth year], 82 y.o.   MRN: [id number]  DOS:  03/06/2023 Type of visit - description: Follow-up  Since the last office visit is doing well. Office visit notes from other physicians reviewed. Not requiring oxygen. Ambulatory BPs okay. No recent cough. Edema well-controlled.   Review of Systems See above   Past Medical History:  Diagnosis Date   Atrial fibrillation (HCC)    a. dx 03/2019 while in hospital with cholecystitis   COPD (chronic obstructive pulmonary disease) (HCC)    Critical lower limb ischemia (HCC)    a. after TAVR developed ischemic L leg likely due to flap/closure, s/p emergent repair of left femoral artery with endarterectomy and Dacron patch angioplasty.   Emphysema lung (HCC)    Hyperlipidemia    LDL goal < 70   Hypertension    Hypomagnesemia    Normal coronary arteries 10/2018   NSVT (nonsustained ventricular tachycardia) (HCC) 03/02/2019   Pulmonary HTN (HCC) 06/05/2016   Moderate with PASP by echo 04/2016 likely Group 3 from COPD and possibly Group 2 from pulmonary venous HTN associated with moderate AS - f/u echo 12/2018 post TAVR showed normal RVSP   Pulmonary nodules    seen on pre TAVR CT, needs 12 month CT follow up   S/P TAVR (transcatheter aortic valve replacement) 12/30/2018   Medtronic Evolut Pro-Plus THV (size 26 mm, serial # [id number]) via the TF approach   Sacral fracture (HCC)    Severe aortic stenosis    a. s/p TAVR 12/2018.   Thoracic ascending aortic aneurysm (HCC)    needs yearly follow up   Thyroid nodule 11/08/2022    Past Surgical History:  Procedure Laterality Date   APPENDECTOMY  1951   CATARACT EXTRACTION     CESAREAN SECTION     1971   CHOLECYSTECTOMY N/A 04/28/2019   Procedure: LAPAROSCOPIC CHOLECYSTECTOMY;  Surgeon: Almond Lint, MD;  Location: [facility];  Service: General;  Laterality: N/A;   COLONOSCOPY  2018   FEMORAL-POPLITEAL BYPASS GRAFT Left 12/31/2018    Procedure: PATCH ANGIOPLASTY REPAIR LEFT FEMORAL ARTERY USING HEMASHIELD PLATINUM FINESSE PATCH;  Surgeon: Larina Earthly, MD;  Location: [facility];  Service: Vascular;  Laterality: Left;   IR RADIOLOGY PERIPHERAL GUIDED IV START  05/30/2017   IR RADIOLOGY PERIPHERAL GUIDED IV START  09/02/2018   IR RADIOLOGY PERIPHERAL GUIDED IV START  09/02/2018   IR US GUIDE VASC ACCESS LEFT  09/02/2018   IR US GUIDE VASC ACCESS RIGHT  05/30/2017   IR US GUIDE VASC ACCESS RIGHT  09/02/2018   JOINT REPLACEMENT  2016   sacroplasty   LAMINECTOMY     RIGHT/LEFT HEART CATH AND CORONARY ANGIOGRAPHY N/A 11/27/2018   Procedure: RIGHT/LEFT HEART CATH AND CORONARY ANGIOGRAPHY;  Surgeon: Tonny Bollman, MD;  Location: [facility] INVASIVE CV LAB;  Service: Cardiovascular;  Laterality: N/A;   TEE WITHOUT CARDIOVERSION N/A 12/30/2018   Procedure: TRANSESOPHAGEAL ECHOCARDIOGRAM (TEE);  Surgeon: Tonny Bollman, MD;  Location: [facility] INVASIVE CV LAB;  Service: Open Heart Surgery;  Laterality: N/A;   TOTAL HIP ARTHROPLASTY Bilateral    TRANSCATHETER AORTIC VALVE REPLACEMENT, TRANSFEMORAL N/A 12/30/2018   Procedure: TRANSCATHETER AORTIC VALVE REPLACEMENT, TRANSFEMORAL;  Surgeon: Tonny Bollman, MD;  Location: [facility] INVASIVE CV LAB;  Service: Open Heart Surgery;  Laterality: N/A;   TUBAL LIGATION  1975    Current Outpatient Medications  Medication Instructions   acetaminophen (TYLENOL) 650 mg, Oral, Every 6 hours  PRN   albuterol (VENTOLIN HFA) 108 (90 Base) MCG/ACT inhaler 2 puffs, Inhalation, Every 6 hours PRN   amLODipine (NORVASC) 5 mg, Oral, Daily   amoxicillin (AMOXIL) 500 MG tablet Take 4 tablets (2,000mg ) one hour prior to all dental visits.   aspirin EC 81 mg, Daily   Budeson-Glycopyrrol-Formoterol (BREZTRI AEROSPHERE) 160-9-4.8 MCG/ACT AERO 2 puffs, Inhalation, 2 times daily   Calcium Carb-Cholecalciferol (CALCIUM 600 + D PO) 600 mg, Oral, 3 times weekly   denosumab (PROLIA) 60 mg, Subcutaneous, Every 6 months, Administer in upper arm,  thigh, or abdomen    EPINEPHrine (EPI-PEN) 0.3 mg, As needed   furosemide (LASIX) 20 MG tablet DAILY AS NEEDED FOR SWELLING   hydrochlorothiazide (MICROZIDE) 12.5 MG capsule TAKE 1 CAPSULE BY MOUTH EVERY DAY   loratadine (CLARITIN) 10 mg, Oral, Daily PRN   magnesium oxide (MAG-OX) 400 mg, Oral, Daily   metoprolol succinate (TOPROL-XL) 25 MG 24 hr tablet TAKE 1 TABLET BY MOUTH EVERY DAY   Multiple Vitamin (MULTIVITAMIN WITH MINERALS) TABS tablet 1 tablet, Oral, Daily   rosuvastatin (CRESTOR) 40 mg, Oral, Daily   Vitamin D3 5,000 Units, Oral, Daily       Objective:   Physical Exam BP 136/74   Pulse 92   Temp 97.6 F (36.4 C) (Oral)   Resp 18   Ht 5\' 5"  (1.651 m)   Wt 163 lb 2 oz (74 kg)   SpO2 98%   BMI 27.15 kg/m  General:   Well developed, NAD, BMI noted. HEENT:  Normocephalic . Face symmetric, atraumatic. Ears normal, TMs normal. Lungs:  CTA B Normal respiratory effort, no intercostal retractions, no accessory muscle use. Heart: RRR,  no murmur.  Lower extremities: no pretibial edema bilaterally  Skin: Not pale. Not jaundice Neurologic:  alert & oriented X3.  Speech normal, gait appropriate for age and unassisted Psych--  Cognition and judgment appear intact.  Cooperative with normal attention span and concentration.  Behavior appropriate. No anxious or depressed appearing.      Assessment     Assessment Hyperglycemia HTN Hyperlipidemia PULM: -Emphysema, quit tobacco 2006 -CT chest angio  05-2016 ----pulm nodule x2,reticular infiltrate ; f/u CT 11/2016 stable nodule, no f/u ----enlarged R P.A. and Ao -Abnormal PET scan 12-2019 CV: -DOE: (-) myoview  8-017, CT angio chest 05-2016:  no PE  -S/p TVAR 12/2018 -Paroxysmal A. fib DX 6/28/ 2020 in the context of acute cholecystitis, converted to NSR, not anticoagulated as of 05/25/2019 --Nonobstructive CAD (per coronary CTA) -Aspirin indefinitely, SBE prophylaxis with amoxicillin -MSK --DJD --Sacral Fx, sacral  insufficiency, s/p sacroplasty (radiology) 07-2015 --H/o osteoporosis :  took fosamax while in [place], Fosamax rx again by ortho after sacral Fx 07-2015, never took it, first Prolia 11-14-2015 --T score 08-2015 >> normal (likely normal d/t DJD?) H/o vit D def: Normal level 09-2015   PLAN  HTN: BP today is very good, reports normal readings at home, continue with amlodipine, Lasix, HCTZ, metoprolol.  Check BMP.  No edema on exam today. Pulmonary nodules-COPD: Saw pulmonary 02/15/2023, they are planning to check a CT chest July 2024. Was rec to continue [place].  Well-controlled. P-A-fib.  S/p TEVAR  Saw cardiology 12/07/2022, they were considering repeat echo in December 2024, no medication changes. Osteoporosis: We are working on getting her Prolia shot Hypoxemia: Since the last office visit resolved. HOH: Also complaining of HOH, no tinnitus, no history of excessive exposing to noise.  Ear exam normal.  Offered referral for a more comprehensive exam but  declined it. Preventive care: Rec RSV.  Had a COVID-vaccine 07-2022 and booster after 4 months is recommended.  Patient is hesitant. RTC 5 to 6 months routine checkup

## 2023-03-06 NOTE — Patient Instructions (Addendum)
Please consider RSV vaccine.   Continue checking your blood pressures BP GOAL is between 110/65 and  135/85. If it is consistently higher or lower, let me know     GO TO THE LAB : Get the blood work     GO TO THE FRONT DESK, PLEASE SCHEDULE YOUR APPOINTMENTS Come back for checkup in 5 months

## 2023-03-06 NOTE — Assessment & Plan Note (Signed)
HTN: BP today is very good, reports normal readings at home, continue with amlodipine, Lasix, HCTZ, metoprolol.  Check BMP.  No edema on exam today. Pulmonary nodules-COPD: Saw pulmonary 02/15/2023, they are planning to check a CT chest July 2024. Was rec to continue [place].  Well-controlled. P-A-fib.  S/p TEVAR  Saw cardiology 12/07/2022, they were considering repeat echo in December 2024, no medication changes. Osteoporosis: We are working on getting her Prolia shot Hypoxemia: Since the last office visit resolved. HOH: Also complaining of HOH, no tinnitus, no history of excessive exposing to noise.  Ear exam normal.  Offered referral for a more comprehensive exam but declined it. Preventive care: Rec RSV.  Had a COVID-vaccine 07-2022 and booster after 4 months is recommended.  Patient is hesitant. RTC 5 to 6 months routine checkup

## 2023-03-11 ENCOUNTER — Telehealth: Payer: Self-pay

## 2023-03-11 NOTE — Telephone Encounter (Signed)
Prolia VOB initiated via [email or web address] 

## 2023-03-11 NOTE — Telephone Encounter (Signed)
Created new encounter for Prolia BIV. Will route encounter back once benefit verification is complete. 

## 2023-03-12 NOTE — Progress Notes (Signed)
[facility] PREP Weekly Session  Patient Details  Name: Dominique [name] MRN: [id number] Date of Birth: October 14, 1941 Age: 82 y.o. PCP: Wanda Plump, MD  There were no vitals filed for this visit.   [facility] Weekly seesion - 03/12/23 1500       [facility] "PREP" Location   [facility] "PREP" Location [facility]      Weekly Session   Topic Discussed Goal setting and welcome to the program   Introductions, reveiw of notebook, tour of facility   Classes attended to date 1             Othmar Ringer B Noreta Kue 03/12/2023, 3:40 PM

## 2023-03-15 ENCOUNTER — Other Ambulatory Visit: Payer: Self-pay | Admitting: Cardiovascular Disease

## 2023-03-15 NOTE — Telephone Encounter (Signed)
Pt ready for scheduling for PROLIA on or after : 03/27/23  Out-of-pocket cost due at time of visit: $0  Primary: MEDICARE Prolia co-insurance: 0% Admin fee co-insurance: 0%  Secondary: [place] [place] Prolia co-insurance:  Admin fee co-insurance:   Medical Benefit Details: Date Benefits were checked: 03/11/23 Deductible: $240 met of $240 required/ Coinsurance: 0%/ Admin Fee: 0%  Prior Auth: n/a PA# Expiration Date:    Pharmacy benefit: Copay $--- If patient wants fill through the pharmacy benefit please send prescription to:  --- , and include estimated need by date in rx notes. Pharmacy will ship medication directly to the office.  Patient NOT eligible for Prolia Copay Card. Copay Card can make patient's cost as little as $25. Link to apply: [email or web address]://[email or web address]  ** This summary of benefits is an estimation of the patient's out-of-pocket cost. Exact cost may very based on individual plan coverage.

## 2023-03-15 NOTE — Telephone Encounter (Signed)
Rx request sent to pharmacy.  

## 2023-03-19 NOTE — Progress Notes (Signed)
[facility] PREP Weekly Session  Patient Details  Name: Dominique [name] MRN: [id number] Date of Birth: 04/13/1941 Age: 82 y.o. PCP: Wanda Plump, MD  Vitals:   03/19/23 1531  Weight: 160 lb (72.6 kg)     [facility] Weekly seesion - 03/19/23 1500       [facility] "PREP" Location   [facility] "PREP" Location [facility]      Weekly Session   Topic Discussed Importance of resistance training;Other ways to be active   Cardio: work up to 150 min/wk  Strength training: 2-3 times/wk for 20-40 minutes   Minutes exercised this week 40 minutes    Classes attended to date 3             Mervyn Gay B Taha Dimond 03/19/2023, 3:32 PM

## 2023-03-26 NOTE — Telephone Encounter (Signed)
Pt scheduled for 1115am on 04/03/23

## 2023-03-27 NOTE — Progress Notes (Signed)
[facility] PREP Weekly Session  Patient Details  Name: Dominique [name] MRN: [id number] Date of Birth: 07-19-1941 Age: 82 y.o. PCP: Wanda Plump, MD  Vitals:   03/26/23 1530  Weight: 161 lb 12.8 oz (73.4 kg)     [facility] Weekly seesion - 03/27/23 1200       [facility] "PREP" Location   [facility] "PREP" Location [facility]      Weekly Session   Topic Discussed Healthy eating tips   introduced YUKA app, foods to reduce, foods to increase; eat the rainbow of colors   Minutes exercised this week 35 minutes    Classes attended to date 5             Hatim Homann B Merrit Friesen 03/27/2023, 12:09 PM

## 2023-04-02 NOTE — Progress Notes (Signed)
[facility] PREP Weekly Session  Patient Details  Name: Dominique [name] MRN: [id number] Date of Birth: 09-20-1941 Age: 82 y.o. PCP: Wanda Plump, MD  Vitals:   04/02/23 1537  Weight: 162 lb 9.6 oz (73.8 kg)     [facility] Weekly seesion - 04/02/23 1500       [facility] "PREP" Location   [facility] "PREP" Location [facility]      Weekly Session   Topic Discussed Health habits   sugar demo   Minutes exercised this week 65 minutes    Classes attended to date 7             Sonia Baller 04/02/2023, 3:38 PM

## 2023-04-03 ENCOUNTER — Ambulatory Visit (INDEPENDENT_AMBULATORY_CARE_PROVIDER_SITE_OTHER): Payer: Medicare Other

## 2023-04-03 DIAGNOSIS — M810 Age-related osteoporosis without current pathological fracture: Secondary | ICD-10-CM

## 2023-04-03 MED ORDER — DENOSUMAB 60 MG/ML ~~LOC~~ SOSY
60.0000 mg | PREFILLED_SYRINGE | Freq: Once | SUBCUTANEOUS | Status: AC
Start: 2023-04-03 — End: 2023-04-03
  Administered 2023-04-03: 60 mg via SUBCUTANEOUS

## 2023-04-03 NOTE — Progress Notes (Signed)
Pt here for Prolia injection per Dr. Drue Novel. Pt states she has not had any side effects from prior shots.  Shot given in left arm. Patient handled well.

## 2023-04-05 ENCOUNTER — Telehealth: Payer: Self-pay | Admitting: *Deleted

## 2023-04-05 NOTE — Telephone Encounter (Signed)
Pt had Prolia injection on 04/03/23.

## 2023-04-09 NOTE — Progress Notes (Signed)
[facility] PREP Weekly Session  Patient Details  Name: Dominique [name] MRN: [id number] Date of Birth: 02/20/1941 Age: 82 y.o. PCP: Wanda Plump, MD  Vitals:   04/09/23 1602  Weight: 159 lb 12.8 oz (72.5 kg)     [facility] Weekly seesion - 04/09/23 1600       [facility] "PREP" Location   [facility] "PREP" Location [facility]      Weekly Session   Topic Discussed Restaurant Eating   limit Na intake to 1500-2300 mg/day; salt demo   Minutes exercised this week 125 minutes    Classes attended to date 18             Metztli Sachdev B Tedd Cottrill 04/09/2023, 4:06 PM

## 2023-04-16 NOTE — Progress Notes (Signed)
[facility] PREP Weekly Session  Patient Details  Name: Dominique [name] MRN: [id number] Date of Birth: 1941-10-13 Age: 82 y.o. PCP: Wanda Plump, MD  Vitals:   04/16/23 1505  Weight: 161 lb 12.8 oz (73.4 kg)     [facility] Weekly seesion - 04/16/23 1500       [facility] "PREP" Location   [facility] "PREP" Location [facility]      Weekly Session   Topic Discussed Stress management and problem solving   importance of sleep 7-9 hrs/night; finger tip mudra breathwork   Minutes exercised this week 35 minutes    Classes attended to date 57             Wynn Kernes B Lakeyia Surber 04/16/2023, 3:07 PM

## 2023-04-23 NOTE — Progress Notes (Signed)
[facility] PREP Weekly Session  Patient Details  Name: Dominique [name] MRN: [id number] Date of Birth: 02-19-1941 Age: 82 y.o. PCP: Wanda Plump, MD  Vitals:   04/23/23 1448  Weight: 159 lb 6.4 oz (72.3 kg)     [facility] Weekly seesion - 04/23/23 1400       [facility] "PREP" Location   [facility] "PREP" Location [facility]      Weekly Session   Topic Discussed Expectations and non-scale victories   Halfway through program, time to revisit/restate/refocus goals; Staying positive, recognize non scale victories   Minutes exercised this week 95 minutes    Classes attended to date 24             Jodell Weitman B Mahsa Hanser 04/23/2023, 2:51 PM

## 2023-04-30 NOTE — Progress Notes (Signed)
[facility] PREP Weekly Session  Patient Details  Name: Dominique [name] MRN: [id number] Date of Birth: 1941-07-24 Age: 82 y.o. PCP: Wanda Plump, MD  Vitals:   04/30/23 1509  Weight: 160 lb (72.6 kg)     [facility] Weekly seesion - 04/30/23 1500       [facility] "PREP" Location   [facility] "PREP" Location [facility]      Weekly Session   Topic Discussed Other   Portion control; visualize your portion size demo; reduced sugar craisin label review   Minutes exercised this week 105 minutes    Classes attended to date 47             Takeela Peil B Hayleen Clinkscales 04/30/2023, 3:10 PM

## 2023-05-07 NOTE — Progress Notes (Signed)
[facility] PREP Weekly Session  Patient Details  Name: Dominique [name] MRN: [id number] Date of Birth: 09/26/1941 Age: 82 y.o. PCP: Wanda Plump, MD  There were no vitals filed for this visit.   [facility] Weekly seesion - 05/07/23 1500       [facility] "PREP" Location   [facility] "PREP" Location [facility]      Weekly Session   Topic Discussed Finding support   Review of foods nution labels/ingredients of foods brought in from home.   Minutes exercised this week 115 minutes    Classes attended to date 60             Karsin Pesta B Kru Allman 05/07/2023, 3:36 PM

## 2023-05-14 NOTE — Progress Notes (Signed)
[facility] PREP Weekly Session  Patient Details  Name: Dominique [name] MRN: [id number] Date of Birth: [birth year] Age: 82 y.o. PCP: Wanda Plump, MD  Vitals:   05/14/23 1513  Weight: 158 lb (71.7 kg)     [facility] Weekly seesion - 05/14/23 1500       [facility] "PREP" Location   [facility] "PREP" Location [facility]      Weekly Session   Topic Discussed Calorie breakdown   Simple vs. Complex carbs, fats, proteins---USDA recommended daily intakes; supplements, membership talk w/[facility]   Minutes exercised this week 105 minutes    Classes attended to date 42             [name] [name] 05/14/2023, 3:15 PM

## 2023-05-15 ENCOUNTER — Ambulatory Visit (HOSPITAL_BASED_OUTPATIENT_CLINIC_OR_DEPARTMENT_OTHER)
Admission: RE | Admit: 2023-05-15 | Discharge: 2023-05-15 | Disposition: A | Discharge status: Home / Self Care | Payer: Medicare Other | Source: Ambulatory Visit | Attending: Acute Care | Admitting: Acute Care

## 2023-05-15 DIAGNOSIS — R911 Solitary pulmonary nodule: Secondary | ICD-10-CM | POA: Insufficient documentation

## 2023-05-15 DIAGNOSIS — I700 Atherosclerosis of aorta: Secondary | ICD-10-CM | POA: Diagnosis not present

## 2023-05-15 DIAGNOSIS — J439 Emphysema, unspecified: Secondary | ICD-10-CM | POA: Diagnosis not present

## 2023-05-15 DIAGNOSIS — R918 Other nonspecific abnormal finding of lung field: Secondary | ICD-10-CM | POA: Diagnosis not present

## 2023-05-20 ENCOUNTER — Telehealth (HOSPITAL_BASED_OUTPATIENT_CLINIC_OR_DEPARTMENT_OTHER): Payer: Self-pay | Admitting: *Deleted

## 2023-05-20 ENCOUNTER — Encounter (HOSPITAL_BASED_OUTPATIENT_CLINIC_OR_DEPARTMENT_OTHER): Payer: Self-pay | Admitting: Cardiovascular Disease

## 2023-05-20 ENCOUNTER — Ambulatory Visit (INDEPENDENT_AMBULATORY_CARE_PROVIDER_SITE_OTHER): Payer: Medicare Other | Admitting: Cardiovascular Disease

## 2023-05-20 VITALS — BP 132/62 | HR 68 | Ht 65.0 in | Wt 158.8 lb

## 2023-05-20 DIAGNOSIS — I10 Essential (primary) hypertension: Secondary | ICD-10-CM | POA: Diagnosis not present

## 2023-05-20 DIAGNOSIS — J438 Other emphysema: Secondary | ICD-10-CM | POA: Diagnosis not present

## 2023-05-20 DIAGNOSIS — I998 Other disorder of circulatory system: Secondary | ICD-10-CM

## 2023-05-20 DIAGNOSIS — I4729 Other ventricular tachycardia: Secondary | ICD-10-CM

## 2023-05-20 DIAGNOSIS — I6523 Occlusion and stenosis of bilateral carotid arteries: Secondary | ICD-10-CM

## 2023-05-20 DIAGNOSIS — Z952 Presence of prosthetic heart valve: Secondary | ICD-10-CM | POA: Diagnosis not present

## 2023-05-20 DIAGNOSIS — E7800 Pure hypercholesterolemia, unspecified: Secondary | ICD-10-CM

## 2023-05-20 DIAGNOSIS — I350 Nonrheumatic aortic (valve) stenosis: Secondary | ICD-10-CM | POA: Diagnosis not present

## 2023-05-20 NOTE — Patient Instructions (Signed)
Medication Instructions:  Your physician recommends that you continue on your current medications as directed. Please refer to the Current Medication list given to you today.  *If you need a refill on your cardiac medications before your next appointment, please call your pharmacy*  Testing/Procedures: Your physician has requested that you have a carotid duplex. This test is an ultrasound of the carotid arteries in your neck. It looks at blood flow through these arteries that supply the brain with blood. Allow one hour for this exam. There are no restrictions or special instructions.  Follow-Up: At [facility], you and your health needs are our priority.  As part of our continuing mission to provide you with exceptional heart care, we have created designated Provider Care Teams.  These Care Teams include your primary Cardiologist (physician) and Advanced Practice Providers (APPs -  Physician Assistants and Nurse Practitioners) who all work together to provide you with the care you need, when you need it.  We recommend signing up for the patient portal called "MyChart".  Sign up information is provided on this After Visit Summary.  MyChart is used to connect with patients for Virtual Visits (Telemedicine).  Patients are able to view lab/test results, encounter notes, upcoming appointments, etc.  Non-urgent messages can be sent to your provider as well.   To learn more about what you can do with MyChart, go to [email or web address].    Your next appointment:   6 months with Dr. Duke Salvia

## 2023-05-20 NOTE — Telephone Encounter (Signed)
Patient in for visit today with Dr Duke Salvia  Dr Duke Salvia requested O2 sats at rest and walking  95% at rest, dropped to 87% walking, walking with O2 at 1 liter maintained at 92%  Discussed with patient and she will discuss oxygen use with pulmonologist, appointment tomorrow  Dr Duke Salvia made aware

## 2023-05-20 NOTE — Progress Notes (Signed)
Cardiology Office Note:  .   Date:  05/20/2023  ID:  ERCELLE WINKLES, DOB 06/23/1941, MRN [id number] PCP: Dominique Plump, MD  Dominique [name] Cardiologist:  Dominique Si, MD    History of Present Illness: .   Dominique [name] is a 82 y.o. female with severe aortic stenosis s/p TAVR, non-obstructive CAD, mild carotid stenosis, hyperlipidemia, SVT, moderate TR, mild pulmonary hypertension, hypertension, and severe COPD here for follow-up. Dominique [name] saw Dominique [name] on 05/2017.  She was noted to have moderate aortic stenosis (peak velocity 3.1 m/s, mean gradient 21 mmHg).  She had a chest CT 11/2016 that showed coronary calcifications.  She subsequently had a coronary CT-A 05/2017 that revealed less than 50% plaque in the proximal LAD and a mild ascending aortic aneurysm (3.9 cm).  She was noted to have calcification of the aorta and aortic valve.  Echo 05/31/17 that revealed LVEF 60-65% with normal diastolic function.  She had a repeat echo 05/2018 that showed her mean aortic valve gradient increased to 36 mmHg.  She continued to report chest discomfort so she had a repeat coronary CT-A 08/2018 that revealed 25-49% stenosis of the proximal LAD unchanged from prior.    She reported increasing exertional dyspnea and chest discomfort.  She was referred to Dominique [name] and underwent TAVR 26 mm Medtronic CoreValve Evolut Pro on 12/2018.  She had a cardiac catheterization prior to TAVR that showed normal coronary arteries.    Postoperative echo revealed a mean gradient of 6 mmHg.  However this procedure was complicated by an ischemic left leg.  She went to the OR emergently with Dr. Arbie Cookey for repair of the left femoral artery with endarterectomy and dacryon patch angioplasty.   Dominique [name] required a laparoscopic cholecystectomy 03/2019.  Her hospitalization was complicated by atrial fibrillation.  This was thought to be due to her acute illness and she was not started on long-term anticoagulation.  She saw  Dominique [name] 05/2019 and reported symptoms concerning for TIA.  She had bilateral carotid Dopplers that showed 60 to 79% right ICA stenosis and 1 to 39% stenosis on the left.  She was referred to neurology and had an MRI of the brain 06/30/2019 that showed chronic small vessel changes but no acute findings.  Repeat carotid Dopplers 02/2020 revealed mild stenosis bilaterally.  Dominique [name] had heart racing after her third COVID-19 vaccination 06/2020.  She wore a heart monitor that showed several runs of SVT.  The longest episode was 7 beats.     She saw Dominique [name], Dominique [name] 02/2021 and was taking lasix intermittently for edema. Her blood pressure was elevated so amlodipine was reduced and she was started on HCTZ. She followed up with Dominique Fabian, NP and her symptoms had improved.  At her appointment 10/2022 blood pressure was elevated in the office but she reported it was well-controlled at home.  She had recently been started on supplemental oxygen.  She saw Dominique Shields, NP 11/2022 and was excited that she was no longer on oxygen.  They noted that her home blood pressure cuff reads 10 points higher than the office cuff.  Her home blood pressures have been running in the 100s to 130s over 50s to 60s.  Dominique [name] presents with recent onset of fatigue and dyspnea. She reports feeling tired and out of breath, leading to a decreased ability to perform household tasks. The patient describes a desire to sleep all day and a lack of energy. She denies experiencing  chest pain or pressure. The dyspnea is more pronounced when moving around, and the patient has not checked her oxygen levels recently.  The patient has been on and off oxygen therapy in the past, with a previous low oxygen level of 77. She has not been using the oxygen machine recently. The patient denies any swelling in her legs or feet and reports stable blood pressure readings.  The patient has been participating in an exercise program, which she plans to  continue despite not finding it enjoyable. She has not needed to take Lasix for swelling for several months. The patient also has a history of carotid artery disease, but has not had recent follow-up with vascular surgery.      ROS:  As per HPI   Studies Reviewed: .       Echo 09/2022:  1. Left ventricular ejection fraction, by estimation, is 55 to 60%. The  left ventricle has normal function. The left ventricle has no regional  wall motion abnormalities. Left ventricular diastolic parameters are  consistent with Grade I diastolic  dysfunction (impaired relaxation).   2. Right ventricular systolic function is mildly reduced. The right  ventricular size is normal. There is normal pulmonary artery systolic  pressure. The estimated right ventricular systolic pressure is 35.9 mmHg.   3. The mitral valve is grossly normal. Trivial mitral valve  regurgitation. No evidence of mitral stenosis.   4. The aortic valve has been repaired/replaced. Aortic valve  regurgitation is trivial- there is both trivial central AI as well as  trivial paravalvular AI. There is a 26 mm Medtronic Evolut Pro valve  present in the aortic position. Procedure Date:  12/30/2018. Aortic valve area, by VTI measures 2.49 cm. Aortic valve mean  gradient measures 4.0 mmHg. Aortic valve Vmax measures 1.38 m/s. Aortic  valve acceleration time measures 67 msec. Grossly normal aortic valve  prosthesis.   5. The inferior vena cava is normal in size with greater than 50%  respiratory variability, suggesting right atrial pressure of 3 mmHg.   Risk Assessment/Calculations:             Physical Exam:    VS:  BP 132/62 (BP Location: Right Arm, Patient Position: Sitting, Cuff Size: Normal)   Pulse 68   Ht 5\' 5"  (1.651 m)   Wt 158 lb 12.8 oz (72 kg)   BMI 26.43 kg/m  , BMI Body mass index is 26.43 kg/m. GENERAL:  Well appearing HEENT: Pupils equal round and reactive, fundi not visualized, oral mucosa unremarkable NECK:   No jugular venous distention, waveform within normal limits, carotid upstroke brisk and symmetric, no bruits, no thyromegaly LUNGS:  Clear to auscultation bilaterally HEART:  RRR.  PMI not displaced or sustained,S1 and S2 within normal limits, no S3, no S4, no clicks, no rubs, II/VI systolic murmur at the LUSB ABD:  Flat, positive bowel sounds normal in frequency in pitch, no bruits, no rebound, no guarding, no midline pulsatile mass, no hepatomegaly, no splenomegaly EXT:  2 plus pulses throughout, no edema, no cyanosis no clubbing SKIN:  No rashes no nodules NEURO:  Cranial nerves II through XII grossly intact, motor grossly intact throughout PSYCH:  Cognitively intact, oriented to person place and time   ASSESSMENT AND PLAN: .       # Fatigue and Dyspnea: Recent onset of fatigue and dyspnea on exertion. No chest pain or edema. No signs of heart failure on exam.  Patient has a history of low oxygen levels and has  been noncompliant with oxygen therapy. -Check oxygen saturation and perform a walk test today. -Review results of recent lung CT when available. - Oxygen dropped to 87% with ambulation.  Recommended that she start supplemental oxygen but she declines until she sees pulmonary at an upcoming performance.   # Carotid Artery Disease: History of carotid artery disease with last ultrasound in 2021 showing mild disease. No recent follow-up with vascular surgery. -Order carotid Doppler to assess current status of disease. - Continue aspirin and rosuvastatin.  # Hypertension: Stable on current regimen of amlodipine, hydrochlorothiazide, and metoprolol.   # CAD:  She has  non-obstructive CAD.  Continue aspirin and statin.   # Aortic stenosis s/p TAVR:  Valve stable on echo 09/2022.  Follow-up in 6 months or sooner if symptoms worsen.         Dispo: 6 months with Jaymee Tilson C. Duke Salvia, MD, [facility] or APP  Signed, Dominique Si, MD

## 2023-05-21 ENCOUNTER — Ambulatory Visit (INDEPENDENT_AMBULATORY_CARE_PROVIDER_SITE_OTHER): Payer: Medicare Other | Admitting: Acute Care

## 2023-05-21 ENCOUNTER — Encounter: Payer: Self-pay | Admitting: Acute Care

## 2023-05-21 VITALS — BP 120/62 | HR 76 | Temp 97.4°F | Ht 65.0 in | Wt 160.4 lb

## 2023-05-21 DIAGNOSIS — R0902 Hypoxemia: Secondary | ICD-10-CM | POA: Diagnosis not present

## 2023-05-21 DIAGNOSIS — R918 Other nonspecific abnormal finding of lung field: Secondary | ICD-10-CM | POA: Diagnosis not present

## 2023-05-21 DIAGNOSIS — J9611 Chronic respiratory failure with hypoxia: Secondary | ICD-10-CM

## 2023-05-21 NOTE — Patient Instructions (Addendum)
It is good to see you today. We will walk you today to see if you Qualify for oxygen.  Your oxygen levels dropped to 86%. This qualifies you for oxygen.  We will place orders for this.  Wear at 3 L [place] inogen pulsed oxygen with activity.   Goal is to maintain oxygen levels greater than 88% at all times.  You will get a call to arrange for delivery after insurance has approved. We will schedule an overnight oximetry study to make sure you are not dropping your oxygen levels at night.  We will do a follow up video visit to go over the results . We will provide you with some Breztri samples today. Two puffs , twice daily, rinse mouth after use. We will fax in your paperwork for financial assistance with [facility].  Use albuterol as needed for breakthrough shortness of breath or wheezing.  I will order a 3 month follow up CT Chest as surveillance of the pulmonary nodules we are following.  Please contact office for sooner follow up if symptoms do not improve or worsen or seek emergency care

## 2023-05-21 NOTE — Progress Notes (Signed)
History of Present Illness Dominique [name] is a 82 y.o. female former smoker  quit 2005 with a 60 pack year smoking history, referred 01/2020 for abnormal CT imaging by Dr. Drue Novel. She is followed by Dr. Tonia Brooms.   Synopsis 82 year-old female with a past medical history of COPD, atrial fibrillation, TAVR placement by Dr. Excell Seltzer last year, hypertension moderate pulmonary hypertension. Patient had CT imaging of the chest which revealed multiple bilateral lung nodules. Some of which had groundglass areas others had in the left upper lobe adjacent to the pleura more solid component. Patient underwent PET scan imaging which revealed left upper lobe nodule with an SUV of 10. Other groundglass areas had low-level PET uptake. There was some concern for potential infectious etiology. Therefore patient was referred to pulmonary for recommendations and management   05/21/2023 Pt. Presents for follow up after CT Chest. The pulmonary nodules we were following were stable on follow up, but still concerting for possible slow growing adenocarcinoma. As such we will do a 3 month follow up to ensure no changes. She is in agreement with this plan.   While she was here today she told us that she was seen by cardiology yesterday 7/22, and her oxygen saturations at the time to 87%. Today we walked her, and her sat level dropped to 86% on RA. We then walked her on an Inogen , and on 3 L she maintained her sats at 95% with fast paced walk. We will place orders for an Inogen. She will wear this at 3 L with activity. I will also order an overnight oximetry to ensure she does not have nocturnal hypoxemia. She does endorse she has exertional dyspnea. She said she has been able to tolerate it. We had a long discussion about how hypoxemia affects the heart and increases risks of additional disease states including heart and brain function, and organ dysfunction.She understands that while she can tolerate the dyspnea, she needs to treat it  to prevent further health risks.    She is compliant with her Breztri. She has completed paperwork for financial assistance, as it costs her > $600.00 per month. She is almost out of medication, so I will provide her with Samples today.  She rarely uses her maintenance inhaler.   Test Results: Chest Imaging: 01/14/2020: CT chest Subsolid 2.4 cm pulmonary nodule concerning for potential malignancy scattered groundglass nodule within the right upper lobe.   01/27/2020 nuclear medicine pet imaging abnormal PET activity within the left upper lobe does not go above mediastinal blood pool however does not exclude low-grade adenocarcinoma. Also has clustered nodularity within the left upper lobe concerning for potential inflammatory disorder.  Or infection. The patient's images have been independently reviewed by me.     05/02/2021 CT chest: Bilateral mixed solid and subsolid groundglass opacities both within the upper lobes.  The left upper lobe medial subpleural nodule is still present. In comparison to previous PET scan results of this lesion is still there since March 2021 was PET avid thn. The patient's images have been independently reviewed by me.     August 2022 PET/CT: Reviewed images today with patient.  Low-level uptake within these have solid lesions within the chest.  Reviewed images with patient. The patient's images have been independently reviewed by me.    CT Chest 05/15/2023 Stable bilateral sub solid lung nodules. Adenocarcinoma cannot be excluded. Follow up by CT is recommended in 12 months Aortic Atherosclerosis     Latest Ref Rng &  Units 10/17/2022   11:13 AM 03/29/2022   11:04 AM 05/20/2021   11:46 AM  CBC  WBC 4.0 - 10.5 K/uL 8.7  6.9  11.0   Hemoglobin 12.0 - 15.0 g/dL 44.0  34.7  42.5   Hematocrit 36.0 - 46.0 % 42.3  42.1  43.5   Platelets 150.0 - 400.0 K/uL 299.0  239.0  238        Latest Ref Rng & Units 03/06/2023   11:13 AM 10/17/2022   11:13 AM 03/29/2022   11:04  AM  BMP  Glucose 70 - 99 mg/dL 94  87  86   BUN 6 - 23 mg/dL 17  17  14    Creatinine 0.40 - 1.20 mg/dL 9.56  3.87  5.64   Sodium 135 - 145 mEq/L 140  141  140   Potassium 3.5 - 5.1 mEq/L 3.7  3.9  4.0   Chloride 96 - 112 mEq/L 100  99  99   CO2 19 - 32 mEq/L 29  32  32   Calcium 8.4 - 10.5 mg/dL 33.2  9.4  9.6     BNP    Component Value Date/Time   BNP 67.0 09/02/2020 1237   BNP 65.3 12/26/2018 1015    ProBNP No results found for: "PROBNP"  PFT    Component Value Date/Time   FEV1PRE 1.06 12/09/2018 1404   FEV1POST 1.07 12/09/2018 1404   FVCPRE 1.77 12/09/2018 1404   FVCPOST 2.17 12/09/2018 1404   TLC 5.43 12/09/2018 1404   DLCOUNC 8.82 12/09/2018 1404   PREFEV1FVCRT 60 12/09/2018 1404   PSTFEV1FVCRT 49 12/09/2018 1404    CT Chest Wo Contrast  Result Date: 05/21/2023 CLINICAL DATA:  Evaluate lung nodule. Scratch follow-up lung nodules. EXAM: CT CHEST WITHOUT CONTRAST TECHNIQUE: Multidetector CT imaging of the chest was performed following the standard protocol without IV contrast. RADIATION DOSE REDUCTION: This exam was performed according to the departmental dose-optimization program which includes automated exposure control, adjustment of the mA and/or kV according to patient size and/or use of iterative reconstruction technique. COMPARISON:  02/12/2023 FINDINGS: Cardiovascular: Normal heart size. Aortic atherosclerosis and coronary artery calcifications. Status post TAVR. No pericardial effusion. Mediastinum/Nodes: No enlarged mediastinal or axillary lymph nodes. Thyroid gland, trachea, and esophagus demonstrate no significant findings. Lungs/Pleura: No pleural effusion. No airspace consolidation, atelectasis or pneumothorax. Emphysema. Within the posteromedial left upper lobe there is a focal area of architectural distortion/subsolid nodule which measures 1.0 x 2.6 cm, image 51/4. This is stable in the interval. Non-solid nodules within the right lung. The largest is in the  posterior right upper lobe measuring 1.9 cm, image 64/4. Non-solid nodule within the posterior right middle lobe is stable measuring 4 mm, image 93/4. Previous left lower lobe non solid nodule measuring 6 mm has resolved in the interval. Upper Abdomen: No acute abnormality. Musculoskeletal: No chest wall mass or suspicious bone lesions identified. IMPRESSION: 1. Stable bilateral sub solid lung nodules. Adenocarcinoma cannot be excluded. Follow up by CT is recommended in 12 months, with continued annual surveillance for a minimum of 3 years. These recommendations are taken from: Recommendations for the Management of Subsolid Pulmonary Nodules Detected at CT: A Statement from the Fleischner Society Radiology 2013; 266:1, 951-884. 2. Aortic Atherosclerosis (ICD10-I70.0) and Emphysema (ICD10-J43.9). Electronically Signed   By: Signa Kell M.D.   On: 05/21/2023 11:36     Past medical hx Past Medical History:  Diagnosis Date   Atrial fibrillation (HCC)    a. dx 03/2019 while  in hospital with cholecystitis   COPD (chronic obstructive pulmonary disease) (HCC)    Critical lower limb ischemia (HCC)    a. after TAVR developed ischemic L leg likely due to flap/closure, s/p emergent repair of left femoral artery with endarterectomy and Dacron patch angioplasty.   Emphysema lung (HCC)    Hyperlipidemia    LDL goal < 70   Hypertension    Hypomagnesemia    Normal coronary arteries 10/2018   NSVT (nonsustained ventricular tachycardia) (HCC) 03/02/2019   Pulmonary HTN (HCC) 06/05/2016   Moderate with PASP by echo 04/2016 likely Group 3 from COPD and possibly Group 2 from pulmonary venous HTN associated with moderate AS - f/u echo 12/2018 post TAVR showed normal RVSP   Pulmonary nodules    seen on pre TAVR CT, needs 12 month CT follow up   S/P TAVR (transcatheter aortic valve replacement) 12/30/2018   Medtronic Evolut Pro-Plus THV (size 26 mm, serial # [id number]) via the TF approach   Sacral fracture (HCC)     Severe aortic stenosis    a. s/p TAVR 12/2018.   Thoracic ascending aortic aneurysm (HCC)    needs yearly follow up   Thyroid nodule 11/08/2022     Social History   Tobacco Use   Smoking status: Former    Current packs/day: 0.00    Average packs/day: 1.5 packs/day for 48.4 years (72.6 ttl pk-yrs)    Types: Cigarettes    Start date: 20    Quit date: 03/19/2004    Years since quitting: 19.1   Smokeless tobacco: Never   Tobacco comments:    Started smoking at age 75  Vaping Use   Vaping status: Never Used  Substance Use Topics   Alcohol use: Yes    Alcohol/week: 3.0 - 4.0 standard drinks of alcohol    Types: 3 - 4 Glasses of wine per week    Comment: socially   Drug use: No    Ms.Moder reports that she quit smoking about 19 years ago. Her smoking use included cigarettes. She started smoking about 67 years ago. She has a 72.6 pack-year smoking history. She has never used smokeless tobacco. She reports current alcohol use of about 3.0 - 4.0 standard drinks of alcohol per week. She reports that she does not use drugs.  Tobacco Cessation: Former smoker quit 2005 with a 48 pack year smoking history   Past surgical hx, Family hx, Social hx all reviewed.  Current Outpatient Medications on File Prior to Visit  Medication Sig   acetaminophen (TYLENOL) 325 MG tablet Take 2 tablets (650 mg total) by mouth every 6 (six) hours as needed for mild pain (or Fever >/= 101).   albuterol (VENTOLIN HFA) 108 (90 Base) MCG/ACT inhaler Inhale 2 puffs into the lungs every 6 (six) hours as needed for wheezing or shortness of breath.   amLODipine (NORVASC) 5 MG tablet TAKE 1 TABLET (5 MG TOTAL) BY MOUTH DAILY.   amoxicillin (AMOXIL) 500 MG tablet Take 4 tablets (2,000mg ) one hour prior to all dental visits.   aspirin EC 81 MG tablet Take 81 mg by mouth daily.   Budeson-Glycopyrrol-Formoterol (BREZTRI AEROSPHERE) 160-9-4.8 MCG/ACT AERO Inhale 2 puffs into the lungs in the morning and at  bedtime.   Calcium Carb-Cholecalciferol (CALCIUM 600 + D PO) Take 600 mg by mouth 3 (three) times a week.    Cholecalciferol (VITAMIN D3) 5000 UNITS CAPS Take 5,000 Units by mouth daily.    denosumab (PROLIA) 60 MG/ML SOLN injection Inject  60 mg into the skin every 6 (six) months. Administer in upper arm, thigh, or abdomen   EPINEPHrine 0.3 mg/0.3 mL IJ SOAJ injection Inject 0.3 mg into the muscle as needed for anaphylaxis.   furosemide (LASIX) 20 MG tablet DAILY AS NEEDED FOR SWELLING   hydrochlorothiazide (MICROZIDE) 12.5 MG capsule TAKE 1 CAPSULE BY MOUTH EVERY DAY   loratadine (CLARITIN) 10 MG tablet Take 10 mg by mouth daily as needed for allergies.    magnesium oxide (MAG-OX) 400 (240 Mg) MG tablet Take 1 tablet (400 mg total) by mouth daily.   metoprolol succinate (TOPROL-XL) 25 MG 24 hr tablet TAKE 1 TABLET BY MOUTH EVERY DAY   Multiple Vitamin (MULTIVITAMIN WITH MINERALS) TABS tablet Take 1 tablet by mouth daily.   rosuvastatin (CRESTOR) 40 MG tablet TAKE 1 TABLET BY MOUTH EVERY DAY   No current facility-administered medications on file prior to visit.     No Known Allergies  Review Of Systems:  Constitutional:   No  weight loss, night sweats,  Fevers, chills, fatigue, or  lassitude.  HEENT:   No headaches,  Difficulty swallowing,  Tooth/dental problems, or  Sore throat,                No sneezing, itching, ear ache, nasal congestion, post nasal drip,   CV:  No chest pain,  Orthopnea, PND, swelling in lower extremities, anasarca, dizziness, palpitations, syncope.   GI  No heartburn, indigestion, abdominal pain, nausea, vomiting, diarrhea, change in bowel habits, loss of appetite, bloody stools.   Resp: + shortness of breath with exertion or at rest.  No excess mucus, no productive cough,  No non-productive cough,  No coughing up of blood.  No change in color of mucus.  No wheezing.  No chest wall deformity  Skin: no rash or lesions.  GU: no dysuria, change in color of urine,  no urgency or frequency.  No flank pain, no hematuria   MS:  No joint pain or swelling.  No decreased range of motion.  No back pain.  Psych:  No change in mood or affect. No depression or anxiety.  No memory loss.   Vital Signs BP 120/62 (BP Location: Left Arm, Patient Position: Sitting, Cuff Size: Normal)   Pulse 76   Temp (!) 97.4 F (36.3 C) (Temporal)   Ht 5\' 5"  (1.651 m)   Wt 160 lb 6.4 oz (72.8 kg)   SpO2 96%   BMI 26.69 kg/m    Physical Exam:  General- No distress,  A&Ox3 ENT: No sinus tenderness, TM clear, pale nasal mucosa, no oral exudate,no post nasal drip, no LAN Cardiac: S1, S2, regular rate and rhythm, no murmur Chest: No wheeze/ rales/ dullness; no accessory muscle use, no nasal flaring, no sternal retractions Abd.: Soft Non-tender, ND, BS +, Body mass index is 26.69 kg/m.  Ext: No clubbing cyanosis, edema Neuro:  normal strength, MAE x 4, A&O x 3, appropriate Skin: No rashes, warm and dry, No lesions  Psych: normal mood and behavior   Assessment/Plan Stable nodules on CT Chest  Chronic Hypoxic Respiratory Failure>> New  Oxygen Qualification 05/21/2023 Plan We will walk you today to see if you Qualify for oxygen.  Your oxygen levels dropped to 86%. This qualifies you for oxygen.  We will place orders for this.  Wear at 3 L [place] inogen pulsed oxygen with activity.   Goal is to maintain oxygen levels greater than 88% at all times.  You will get a call to  arrange for delivery after insurance has approved. We will schedule an overnight oximetry study to make sure you are not dropping your oxygen levels at night.  We will do a follow up video visit to go over the results . We will provide you with some Breztri samples today. Two puffs , twice daily, rinse mouth after use. We will fax in your paperwork for financial assistance with [facility].  Use albuterol as needed for breakthrough shortness of breath or wheezing.  I will order a 3 month follow up CT Chest as  surveillance of the pulmonary nodules we are following.  Please contact office for sooner follow up if symptoms do not improve or worsen or seek emergency care     I spent 45 minutes dedicated to the care of this patient on the date of this encounter to include pre-visit review of records, face-to-face time with the patient discussing conditions above, post visit ordering of testing, clinical documentation with the electronic health record, making appropriate referrals as documented, and communicating necessary information to the patient's healthcare team.   Bevelyn Ngo, NP 05/21/2023  5:19 PM

## 2023-05-22 NOTE — Addendum Note (Signed)
Addended by: Erick Blinks A on: 05/22/2023 04:45 PM   Modules accepted: Orders

## 2023-05-23 ENCOUNTER — Telehealth: Payer: Self-pay

## 2023-05-23 NOTE — Telephone Encounter (Signed)
She called to let me know she will need to withdraw from PREP program now that she has been having episodes of O2 desaturations with any exertion; Discussed getting her worked up/stabilized with oxygen requirements, offered suggestion to discuss pulm rehab with pulmonologist once she has her Oxygen equipment; encouraged her to keep me updated on her progress and assured her she could re-start PREP when she is medically ready/cleared.

## 2023-05-29 ENCOUNTER — Telehealth: Payer: Self-pay | Admitting: Acute Care

## 2023-05-29 NOTE — Telephone Encounter (Signed)
Patient called to inquire about the portable oxygen order that she said was supposed to be sent in.  Please advise and call patient to give her an update.  CB# [phone]

## 2023-06-03 NOTE — Telephone Encounter (Signed)
Pt called in bc she wanted to get her oxygen order provider Pt with Inogen number.

## 2023-06-04 ENCOUNTER — Telehealth: Payer: Self-pay | Admitting: Acute Care

## 2023-06-04 NOTE — Telephone Encounter (Signed)
[name] checking on fax sent for office notes, walking test and referral form. [name] phone number is [phone].

## 2023-06-05 DIAGNOSIS — R0902 Hypoxemia: Secondary | ICD-10-CM | POA: Diagnosis not present

## 2023-06-05 DIAGNOSIS — G4730 Sleep apnea, unspecified: Secondary | ICD-10-CM | POA: Diagnosis not present

## 2023-06-07 ENCOUNTER — Ambulatory Visit (HOSPITAL_BASED_OUTPATIENT_CLINIC_OR_DEPARTMENT_OTHER): Payer: Medicare Other

## 2023-06-07 DIAGNOSIS — I6523 Occlusion and stenosis of bilateral carotid arteries: Secondary | ICD-10-CM

## 2023-06-07 NOTE — Telephone Encounter (Signed)
[employer] calling again waiting for chart notes and 6 min walk test they sent referral from that needed to be filled out please advise

## 2023-06-11 NOTE — Telephone Encounter (Signed)
I do not have anything on this patient they can fax it to [phone]

## 2023-06-12 ENCOUNTER — Telehealth: Payer: Self-pay | Admitting: Acute Care

## 2023-06-12 NOTE — Telephone Encounter (Signed)
[employer] is calling about a fax that needs to be edited. Flow setting needs to be circled and frequency needs to be checked on fax.[phone].

## 2023-06-14 ENCOUNTER — Telehealth: Payer: Self-pay | Admitting: Acute Care

## 2023-06-14 ENCOUNTER — Other Ambulatory Visit: Payer: Self-pay

## 2023-06-14 ENCOUNTER — Other Ambulatory Visit: Payer: Self-pay | Admitting: Acute Care

## 2023-06-14 DIAGNOSIS — G4734 Idiopathic sleep related nonobstructive alveolar hypoventilation: Secondary | ICD-10-CM

## 2023-06-14 NOTE — Telephone Encounter (Signed)
I have called the patient with the results of her overnight oximetry.  Overnight oximetry shows sats of less than 88% for 6 hours 9 minutes and 32 seconds.  Which is 71.46% of the time she was asleep. Patient needs nocturnal oxygen at 2 L nasal cannula nightly. She is also still waiting on her antigen for her previous order for oxygen with exertion. Can we please determine what is needed to get her her [employer] as soon as possible as it has been over a month since that order was written. She will also need nocturnal oxygen. Patient has verbalized understanding of the above and had no further questions at completion of the phone call.  Overnight Oximetry 06/05/2023

## 2023-06-14 NOTE — Telephone Encounter (Signed)
Order for nocturnal oxygen has been placed. Closing encounter. NFN

## 2023-06-14 NOTE — Progress Notes (Signed)
 Amb dme

## 2023-06-17 ENCOUNTER — Telehealth: Payer: Self-pay | Admitting: Acute Care

## 2023-06-17 NOTE — Telephone Encounter (Signed)
Alfredia Client, [place]; [place], Tomie China; Kathe Becton Need order updated using O2 template please.       Previous Messages    ----- Message ----- From: Iris Pert Sent: 06/14/2023   4:58 PM EDT To: Henderson Newcomer; Kathyrn Sheriff; Santina Evans; * Subject: Oxygen                                        1941/09/23  Order placed please advise. Thanks

## 2023-06-18 ENCOUNTER — Ambulatory Visit: Payer: Medicare Other | Admitting: Acute Care

## 2023-06-20 NOTE — Telephone Encounter (Signed)
Duplicate.  Previous message sent to provider.

## 2023-06-20 NOTE — Telephone Encounter (Signed)
This message was received from [employer] they are needing the order corrected

## 2023-06-21 ENCOUNTER — Telehealth: Payer: Self-pay | Admitting: Acute Care

## 2023-06-21 NOTE — Telephone Encounter (Signed)
Printed and faxed

## 2023-06-21 NOTE — Telephone Encounter (Signed)
Micheal from [employer] needs walking notes from walking test. There needs to be oxygen saturation levels;at rest oxygen saturation, the increase with oxygen applied and the drop.

## 2023-06-21 NOTE — Telephone Encounter (Signed)
Direct line: [phone] fax [phone]

## 2023-06-22 ENCOUNTER — Other Ambulatory Visit: Payer: Self-pay | Admitting: Cardiovascular Disease

## 2023-06-24 ENCOUNTER — Telehealth: Payer: Self-pay | Admitting: Acute Care

## 2023-06-24 NOTE — Telephone Encounter (Signed)
Rx request sent to pharmacy.  

## 2023-06-24 NOTE — Telephone Encounter (Signed)
Casimiro Needle From [employer] is calling needing the order no signed with it stating Electronically signed but looks like sarah groce was not finished with mher note so need ov notes and left you VM today with Chantel

## 2023-06-24 NOTE — Telephone Encounter (Signed)
I will fax again this has all been signed

## 2023-06-25 ENCOUNTER — Encounter: Payer: Self-pay | Admitting: Acute Care

## 2023-06-27 ENCOUNTER — Other Ambulatory Visit: Payer: Self-pay | Admitting: Cardiovascular Disease

## 2023-06-27 ENCOUNTER — Other Ambulatory Visit: Payer: Self-pay | Admitting: Internal Medicine

## 2023-06-27 NOTE — Telephone Encounter (Signed)
Rx request sent to pharmacy.  

## 2023-07-22 DIAGNOSIS — Z1231 Encounter for screening mammogram for malignant neoplasm of breast: Secondary | ICD-10-CM | POA: Diagnosis not present

## 2023-07-22 DIAGNOSIS — Z6827 Body mass index (BMI) 27.0-27.9, adult: Secondary | ICD-10-CM | POA: Diagnosis not present

## 2023-07-22 DIAGNOSIS — Z01419 Encounter for gynecological examination (general) (routine) without abnormal findings: Secondary | ICD-10-CM | POA: Diagnosis not present

## 2023-08-06 ENCOUNTER — Encounter: Payer: Self-pay | Admitting: Internal Medicine

## 2023-08-06 ENCOUNTER — Ambulatory Visit: Payer: Medicare Other | Admitting: Internal Medicine

## 2023-08-06 VITALS — BP 126/82 | HR 60 | Temp 98.0°F | Resp 18 | Ht 65.0 in | Wt 165.1 lb

## 2023-08-06 DIAGNOSIS — Z09 Encounter for follow-up examination after completed treatment for conditions other than malignant neoplasm: Secondary | ICD-10-CM

## 2023-08-06 DIAGNOSIS — I6523 Occlusion and stenosis of bilateral carotid arteries: Secondary | ICD-10-CM

## 2023-08-06 DIAGNOSIS — E7800 Pure hypercholesterolemia, unspecified: Secondary | ICD-10-CM | POA: Diagnosis not present

## 2023-08-06 DIAGNOSIS — R5383 Other fatigue: Secondary | ICD-10-CM | POA: Diagnosis not present

## 2023-08-06 DIAGNOSIS — J9611 Chronic respiratory failure with hypoxia: Secondary | ICD-10-CM | POA: Diagnosis not present

## 2023-08-06 DIAGNOSIS — J438 Other emphysema: Secondary | ICD-10-CM | POA: Diagnosis not present

## 2023-08-06 DIAGNOSIS — I10 Essential (primary) hypertension: Secondary | ICD-10-CM

## 2023-08-06 DIAGNOSIS — I2510 Atherosclerotic heart disease of native coronary artery without angina pectoris: Secondary | ICD-10-CM

## 2023-08-06 DIAGNOSIS — R739 Hyperglycemia, unspecified: Secondary | ICD-10-CM | POA: Diagnosis not present

## 2023-08-06 LAB — CBC WITH DIFFERENTIAL/PLATELET
Basophils Absolute: 0.1 10*3/uL (ref 0.0–0.1)
Basophils Relative: 0.8 % (ref 0.0–3.0)
Eosinophils Absolute: 0.5 10*3/uL (ref 0.0–0.7)
Eosinophils Relative: 5.8 % — ABNORMAL HIGH (ref 0.0–5.0)
HCT: 40.8 % (ref 36.0–46.0)
Hemoglobin: 13.3 g/dL (ref 12.0–15.0)
Lymphocytes Relative: 19.7 % (ref 12.0–46.0)
Lymphs Abs: 1.6 10*3/uL (ref 0.7–4.0)
MCHC: 32.6 g/dL (ref 30.0–36.0)
MCV: 93.8 fL (ref 78.0–100.0)
Monocytes Absolute: 0.8 10*3/uL (ref 0.1–1.0)
Monocytes Relative: 10.4 % (ref 3.0–12.0)
Neutro Abs: 5 10*3/uL (ref 1.4–7.7)
Neutrophils Relative %: 63.3 % (ref 43.0–77.0)
Platelets: 248 10*3/uL (ref 150.0–400.0)
RBC: 4.35 Mil/uL (ref 3.87–5.11)
RDW: 14.3 % (ref 11.5–15.5)
WBC: 7.9 10*3/uL (ref 4.0–10.5)

## 2023-08-06 LAB — BASIC METABOLIC PANEL
BUN: 20 mg/dL (ref 6–23)
CO2: 31 meq/L (ref 19–32)
Calcium: 9.4 mg/dL (ref 8.4–10.5)
Chloride: 100 meq/L (ref 96–112)
Creatinine, Ser: 0.79 mg/dL (ref 0.40–1.20)
GFR: 69.24 mL/min (ref >60.00)
Glucose, Bld: 91 mg/dL (ref 70–99)
Potassium: 3.8 meq/L (ref 3.5–5.1)
Sodium: 140 meq/L (ref 135–145)

## 2023-08-06 LAB — TSH: TSH: 1.52 u[IU]/mL (ref 0.35–5.50)

## 2023-08-06 LAB — IRON: Iron: 106 ug/dL (ref 42–145)

## 2023-08-06 LAB — HEMOGLOBIN A1C: Hgb A1c MFr Bld: 5.9 % (ref 4.6–6.5)

## 2023-08-06 LAB — FERRITIN: Ferritin: 96 ng/mL (ref 10.0–291.0)

## 2023-08-06 NOTE — Assessment & Plan Note (Signed)
Saw cardiology 05/20/2023, she complained of fatigue and DOE, they noted to be slightly hypoxic. The next day saw pulmonary, they confirm O2 sat drop with exertion and was prescribed oxygen. The patient is quite reluctant to take it, using it "only at night" and as needed. Reports she feels well doing all her ADLs so is not using oxygen consistently however, at the same time, still reports fatigue. Explained patient benefits of O2 and risks of not using it . She verbalized understanding but remains reluctant. CAD, carotid artery disease, paroxysmal A-fib , status post EVAR: Cardiology visit 05/20/2023, see above. COPD, chronic hypoxic respiratory failure: See above, good compliance with inhalers. Hypoxemia: See above Osteoporosis: Last Prolia 04/03/2023 HTN: Seems well-controlled, continue amlodipine, HCTZ, metoprolol.  Takes Lasix rarely for edema.  Check labs. Hyperglycemia: Mild per chart review, check A1c. Fatigue: Still has fatigue, hypoxemia is likely playing a role but she is reluctant to use oxygen consistently.  Vitamins have been check over the years and they were normal.  Plan: Checking general labs, also TSH as well as iron/ferritin per patient request. Vaccine advice provided, declined need to take any vaccine today. RTC 4 months

## 2023-08-06 NOTE — Progress Notes (Signed)
Subjective:    Patient ID: Dominique [name], female    DOB: 1941/05/15, 82 y.o.   MRN: [id number]  DOS:  08/06/2023 Type of visit - description: f/u  Saw cardiology and pulmonology, notes reviewed.  She is still complaining of fatigue. Denies fever or chills. No chest pain or palpitations. Has occasional edema.  No cough.   Review of Systems See above   Past Medical History:  Diagnosis Date   Atrial fibrillation (HCC)    a. dx 03/2019 while in hospital with cholecystitis   COPD (chronic obstructive pulmonary disease) (HCC)    Critical lower limb ischemia (HCC)    a. after TAVR developed ischemic L leg likely due to flap/closure, s/p emergent repair of left femoral artery with endarterectomy and Dacron patch angioplasty.   Emphysema lung (HCC)    Hyperlipidemia    LDL goal < 70   Hypertension    Hypomagnesemia    Normal coronary arteries 10/2018   NSVT (nonsustained ventricular tachycardia) (HCC) 03/02/2019   Pulmonary HTN (HCC) 06/05/2016   Moderate with PASP by echo 04/2016 likely Group 3 from COPD and possibly Group 2 from pulmonary venous HTN associated with moderate AS - f/u echo 12/2018 post TAVR showed normal RVSP   Pulmonary nodules    seen on pre TAVR CT, needs 12 month CT follow up   S/P TAVR (transcatheter aortic valve replacement) 12/30/2018   Medtronic Evolut Pro-Plus THV (size 26 mm, serial # [id number]) via the TF approach   Sacral fracture (HCC)    Severe aortic stenosis    a. s/p TAVR 12/2018.   Thoracic ascending aortic aneurysm (HCC)    needs yearly follow up   Thyroid nodule 11/08/2022    Past Surgical History:  Procedure Laterality Date   APPENDECTOMY  1951   CATARACT EXTRACTION     CESAREAN SECTION     1971   CHOLECYSTECTOMY N/A 04/28/2019   Procedure: LAPAROSCOPIC CHOLECYSTECTOMY;  Surgeon: Almond Lint, MD;  Location: [facility];  Service: General;  Laterality: N/A;   COLONOSCOPY  2018   FEMORAL-POPLITEAL BYPASS GRAFT Left 12/31/2018    Procedure: PATCH ANGIOPLASTY REPAIR LEFT FEMORAL ARTERY USING HEMASHIELD PLATINUM FINESSE PATCH;  Surgeon: Larina Earthly, MD;  Location: [facility] OR;  Service: Vascular;  Laterality: Left;   IR RADIOLOGY PERIPHERAL GUIDED IV START  05/30/2017   IR RADIOLOGY PERIPHERAL GUIDED IV START  09/02/2018   IR RADIOLOGY PERIPHERAL GUIDED IV START  09/02/2018   IR US GUIDE VASC ACCESS LEFT  09/02/2018   IR US GUIDE VASC ACCESS RIGHT  05/30/2017   IR US GUIDE VASC ACCESS RIGHT  09/02/2018   JOINT REPLACEMENT  2016   sacroplasty   LAMINECTOMY     RIGHT/LEFT HEART CATH AND CORONARY ANGIOGRAPHY N/A 11/27/2018   Procedure: RIGHT/LEFT HEART CATH AND CORONARY ANGIOGRAPHY;  Surgeon: Tonny Bollman, MD;  Location: [facility] INVASIVE CV LAB;  Service: Cardiovascular;  Laterality: N/A;   TEE WITHOUT CARDIOVERSION N/A 12/30/2018   Procedure: TRANSESOPHAGEAL ECHOCARDIOGRAM (TEE);  Surgeon: Tonny Bollman, MD;  Location: [facility] INVASIVE CV LAB;  Service: Open Heart Surgery;  Laterality: N/A;   TOTAL HIP ARTHROPLASTY Bilateral    TRANSCATHETER AORTIC VALVE REPLACEMENT, TRANSFEMORAL N/A 12/30/2018   Procedure: TRANSCATHETER AORTIC VALVE REPLACEMENT, TRANSFEMORAL;  Surgeon: Tonny Bollman, MD;  Location: [facility] INVASIVE CV LAB;  Service: Open Heart Surgery;  Laterality: N/A;   TUBAL LIGATION  1975    Current Outpatient Medications  Medication Instructions   acetaminophen (TYLENOL) 650 mg, Oral, Every  6 hours PRN   albuterol (VENTOLIN HFA) 108 (90 Base) MCG/ACT inhaler 2 puffs, Inhalation, Every 6 hours PRN   amLODipine (NORVASC) 5 mg, Oral, Daily   amoxicillin (AMOXIL) 500 MG tablet Take 4 tablets (2,000mg ) one hour prior to all dental visits.   aspirin EC 81 mg, Daily   Budeson-Glycopyrrol-Formoterol (BREZTRI AEROSPHERE) 160-9-4.8 MCG/ACT AERO 2 puffs, Inhalation, 2 times daily   Calcium Carb-Cholecalciferol (CALCIUM 600 + D PO) 600 mg, Oral, 3 times weekly   denosumab (PROLIA) 60 mg, Subcutaneous, Every 6 months, Administer in upper arm,  thigh, or abdomen    EPINEPHrine (EPI-PEN) 0.3 mg, As needed   furosemide (LASIX) 20 MG tablet DAILY AS NEEDED FOR SWELLING   hydrochlorothiazide (MICROZIDE) 12.5 MG capsule TAKE 1 CAPSULE BY MOUTH EVERY DAY   loratadine (CLARITIN) 10 mg, Oral, Daily PRN   magnesium oxide (MAG-OX) 400 mg, Oral, Daily   metoprolol succinate (TOPROL-XL) 25 MG 24 hr tablet TAKE 1 TABLET BY MOUTH EVERY DAY   Multiple Vitamin (MULTIVITAMIN WITH MINERALS) TABS tablet 1 tablet, Oral, Daily   OXYGEN 2 L, Inhalation, Continuous   rosuvastatin (CRESTOR) 40 mg, Oral, Daily   Vitamin D3 5,000 Units, Oral, Daily       Objective:   Physical Exam BP 126/82   Pulse 60   Temp 98 F (36.7 C) (Oral)   Resp 18   Ht 5\' 5"  (1.651 m)   Wt 165 lb 2 oz (74.9 kg)   SpO2 94%   BMI 27.48 kg/m  General:   Well developed, NAD, BMI noted. HEENT:  Normocephalic . Face symmetric, atraumatic Lungs:  CTA B Normal respiratory effort, no intercostal retractions, no accessory muscle use. Heart: RRR, systolic murmur better heard right from the sternum. Lower extremities: no pretibial edema bilaterally  Skin: Not pale. Not jaundice Neurologic:  alert & oriented X3.  Speech normal, gait appropriate for age and unassisted Psych--  Cognition and judgment appear intact.  Cooperative with normal attention span and concentration.  Behavior appropriate. No anxious or depressed appearing.      Assessment    Assessment Hyperglycemia HTN Hyperlipidemia PULM: -Emphysema, quit tobacco 2006 --Nocturnal O2 as needed -CT chest angio  05-2016 ----pulm nodule x2,reticular infiltrate ; f/u CT 11/2016 stable nodule, no f/u ----enlarged R P.A. and Ao -Abnormal PET scan 12-2019 CV: -DOE: (-) myoview  8-017, CT angio chest 05-2016:  no PE  -S/p TVAR 12/2018 -Paroxysmal A. fib DX 6/28/ 2020 in the context of acute cholecystitis, converted to NSR, not anticoagulated as of 05/25/2019 --Nonobstructive CAD (per coronary CTA) -Aspirin  indefinitely, SBE prophylaxis with amoxicillin -MSK --DJD --Sacral Fx, sacral insufficiency, s/p sacroplasty (radiology) 07-2015 --H/o osteoporosis :  took fosamax while in [place], Fosamax rx again by ortho after sacral Fx 07-2015, never took it, first Prolia 11-14-2015 --T score 08-2015 >> normal (likely normal d/t DJD?) H/o vit D def: Normal level 09-2015   PLAN  Saw cardiology 05/20/2023, she complained of fatigue and DOE, they noted to be slightly hypoxic. The next day saw pulmonary, they confirm O2 sat drop with exertion and was prescribed oxygen. The patient is quite reluctant to take it, using it "only at night" and as needed. Reports she feels well doing all her ADLs so is not using oxygen consistently however, at the same time, still reports fatigue. Explained patient benefits of O2 and risks of not using it . She verbalized understanding but remains reluctant. CAD, carotid artery disease, paroxysmal A-fib , status post EVAR: Cardiology  visit 05/20/2023, see above. COPD, chronic hypoxic respiratory failure: See above, good compliance with inhalers. Hypoxemia: See above Osteoporosis: Last Prolia 04/03/2023 chronic HTN: Seems well-controlled, continue amlodipine, HCTZ, metoprolol.  Takes Lasix rarely for edema.  Check labs. Hyperglycemia: Mild per chart review, check A1c. Fatigue: Still has fatigue, hypoxemia is likely playing a role but she is reluctant to use oxygen consistently.  Vitamins have been check over the years and they were normal.  Plan: Checking general labs, also TSH as well as iron/ferritin per patient request. Vaccine advice provided, declined need to take any vaccine today. RTC 4 months

## 2023-08-06 NOTE — Patient Instructions (Addendum)
Vaccines I recommend: Covid booster- new this fall RSV vaccine Flu shot this fall  Recommend to use the oxygen all the time   Check the  blood pressure regularly Blood pressure goal:  between 110/65 and  135/85. If it is consistently higher or lower, let me know     GO TO THE LAB : Get the blood work     Next visit with me in 4 months for a routine checkup    Please schedule it at the front desk

## 2023-08-08 NOTE — Telephone Encounter (Signed)
This was sent on 06/17/23 by Erick Blinks CMA.  Nothing further needed.

## 2023-08-15 DIAGNOSIS — Z23 Encounter for immunization: Secondary | ICD-10-CM | POA: Diagnosis not present

## 2023-08-21 ENCOUNTER — Ambulatory Visit (HOSPITAL_BASED_OUTPATIENT_CLINIC_OR_DEPARTMENT_OTHER)
Admission: RE | Admit: 2023-08-21 | Discharge: 2023-08-21 | Disposition: A | Discharge status: Home / Self Care | Payer: Medicare Other | Source: Ambulatory Visit | Attending: Acute Care | Admitting: Acute Care

## 2023-08-21 DIAGNOSIS — R918 Other nonspecific abnormal finding of lung field: Secondary | ICD-10-CM | POA: Diagnosis not present

## 2023-08-21 DIAGNOSIS — J439 Emphysema, unspecified: Secondary | ICD-10-CM | POA: Diagnosis not present

## 2023-08-27 ENCOUNTER — Ambulatory Visit (INDEPENDENT_AMBULATORY_CARE_PROVIDER_SITE_OTHER): Payer: Medicare Other | Admitting: Acute Care

## 2023-08-27 ENCOUNTER — Encounter: Payer: Self-pay | Admitting: Acute Care

## 2023-08-27 VITALS — BP 144/70 | HR 63 | Ht 66.0 in | Wt 165.8 lb

## 2023-08-27 DIAGNOSIS — R918 Other nonspecific abnormal finding of lung field: Secondary | ICD-10-CM

## 2023-08-27 DIAGNOSIS — M950 Acquired deformity of nose: Secondary | ICD-10-CM

## 2023-08-27 DIAGNOSIS — Z87891 Personal history of nicotine dependence: Secondary | ICD-10-CM

## 2023-08-27 DIAGNOSIS — J449 Chronic obstructive pulmonary disease, unspecified: Secondary | ICD-10-CM

## 2023-08-27 NOTE — Progress Notes (Signed)
History of Present Illness Dominique [name] is a 82 y.o. female  female former smoker  quit 2005 with a 60 pack year smoking history, referred 01/2020 for abnormal CT imaging by Dr. Drue Novel. She is followed by Dr. Tonia Brooms.   Synopsis 82 year-old female with a past medical history of COPD, atrial fibrillation, TAVR placement by Dr. Excell Seltzer last year, hypertension moderate pulmonary hypertension. Patient had CT imaging of the chest which revealed multiple bilateral lung nodules. Some of which had groundglass areas others had in the left upper lobe adjacent to the pleura more solid component.She was referred to Dr. Tonia Brooms in 2021 for  evaluation of this finding. Patient underwent PET scan imaging which revealed left upper lobe nodule with an SUV of 10. Other groundglass areas had low-level PET uptake. There was some concern for potential infectious etiology. Therefore patient was referred to pulmonary for recommendations and management . Patient's case was discussed for possible tissue sampling but it was felt to be too difficult to reach through navigational bronchoscopy. Plan has been for serial Ct chests to monitor for growth.   08/27/2023 Pt. Presents for follow up after surveillance Ct Chest. Last CT Chest was done 05/15/2023.In the last 3 months there has been no change in the size of the nodules. , however per the read, nodules are increased when compared with more remote priors. Findings are consistent with indolent primary lung adenocarcinoma.We discussed biopsy vs continued surveillance as we are concerned this is a slow growing adenocarcinoma. She would prefer to continue with serial surveillance scans at this time. Plan will be for a 6 month follow up. If there is no change at that interval, we will consider annual scanning.   She states she has been wearing her oxygen at bedtime, but not much during the day as her sats are always > 88%. She is using the [place], and she is getting financial assistance  for this through the manufacturer. She uses her albuterol as needed , which is very rare. She does have complaints of not being able to breath through her nose, which leads to mouth breathing and dry throat and cough. She is agreeable to referral to ENT to have this evaluated.   Test Results: Chest Imaging: 08/21/2023 CT Chest  Bilateral subsolid pulmonary nodules, including dominant part solid nodule of the left upper lobe, are unchanged in size when compared with the recent priors, however nodules are increased when compared with more remote priors. Findings are consistent with indolent primary lung adenocarcinoma. Unchanged dilation of the right and left pulmonary arteries, findings can be seen in the setting of pulmonary hypertension. Coronary artery calcifications, aortic Atherosclerosis   01/14/2020: CT chest Subsolid 2.4 cm pulmonary nodule concerning for potential malignancy scattered groundglass nodule within the right upper lobe.   01/27/2020 nuclear medicine pet imaging abnormal PET activity within the left upper lobe does not go above mediastinal blood pool however does not exclude low-grade adenocarcinoma. Also has clustered nodularity within the left upper lobe concerning for potential inflammatory disorder.  Or infection. The patient's images have been independently reviewed by me.     05/02/2021 CT chest: Bilateral mixed solid and subsolid groundglass opacities both within the upper lobes.  The left upper lobe medial subpleural nodule is still present. In comparison to previous PET scan results of this lesion is still there since March 2021 was PET avid thn. The patient's images have been independently reviewed by me.     August 2022 PET/CT: Reviewed images today  with patient.  Low-level uptake within these have solid lesions within the chest.  Reviewed images with patient. The patient's images have been independently reviewed by me.     CT Chest 05/15/2023 Stable bilateral  sub solid lung nodules. Adenocarcinoma cannot be excluded. Follow up by CT is recommended in 12 months Aortic Atherosclerosis      Latest Ref Rng & Units 08/06/2023   11:00 AM 10/17/2022   11:13 AM 03/29/2022   11:04 AM  CBC  WBC 4.0 - 10.5 K/uL 7.9  8.7  6.9   Hemoglobin 12.0 - 15.0 g/dL 32.4  40.1  02.7   Hematocrit 36.0 - 46.0 % 40.8  42.3  42.1   Platelets 150.0 - 400.0 K/uL 248.0  299.0  239.0        Latest Ref Rng & Units 08/06/2023   11:00 AM 03/06/2023   11:13 AM 10/17/2022   11:13 AM  BMP  Glucose 70 - 99 mg/dL 91  94  87   BUN 6 - 23 mg/dL 20  17  17    Creatinine 0.40 - 1.20 mg/dL 2.53  6.64  4.03   Sodium 135 - 145 mEq/L 140  140  141   Potassium 3.5 - 5.1 mEq/L 3.8  3.7  3.9   Chloride 96 - 112 mEq/L 100  100  99   CO2 19 - 32 mEq/L 31  29  32   Calcium 8.4 - 10.5 mg/dL 9.4  47.4  9.4     BNP    Component Value Date/Time   BNP 67.0 09/02/2020 1237   BNP 65.3 12/26/2018 1015    ProBNP No results found for: "PROBNP"  PFT    Component Value Date/Time   FEV1PRE 1.06 12/09/2018 1404   FEV1POST 1.07 12/09/2018 1404   FVCPRE 1.77 12/09/2018 1404   FVCPOST 2.17 12/09/2018 1404   TLC 5.43 12/09/2018 1404   DLCOUNC 8.82 12/09/2018 1404   PREFEV1FVCRT 60 12/09/2018 1404   PSTFEV1FVCRT 49 12/09/2018 1404    CT CHEST WO CONTRAST  Result Date: 08/26/2023 CLINICAL DATA:  Follow-up lung nodules EXAM: CT CHEST WITHOUT CONTRAST TECHNIQUE: Multidetector CT imaging of the chest was performed following the standard protocol without IV contrast. RADIATION DOSE REDUCTION: This exam was performed according to the departmental dose-optimization program which includes automated exposure control, adjustment of the mA and/or kV according to patient size and/or use of iterative reconstruction technique. COMPARISON:  Multiple priors, most recent chest CT dated May 22, 2023; chest CT dated December 09, 2022 FINDINGS: Cardiovascular: Normal heart size. No pericardial effusion. Prior  transcatheter aortic valve replacement. Ascending thoracic aorta is upper limits of normal in size, measuring up to 3.9 cm. Marked dilation of the left and right pulmonary arteries, unchanged when compared with the prior. Mediastinum/Nodes: Small hiatal hernia. Thyroid is unremarkable. No enlarged lymph nodes seen in the chest. Lungs/Pleura: Central airways are patent. Mild emphysema. Stable bilateral subsolid pulmonary nodules. Dominant part solid nodule of the posterior left upper lobe measures 3.0 x 1.0 cm with approximate 7 mm solid component on series 4, image 46, unchanged when compared with recent priors, but has increased in size and demonstrates new solid component when compared with December 09, 2018 prior. Additional ground-glass nodules are seen in the right middle lobe and right upper lobes which are also stable when compared with recent priors. When compared with 2020 prior, right middle lobe nodule is new and left upper lobe nodules are slightly increased in size. No pleural effusion or  pneumothorax. Upper Abdomen: Prior cholecystectomy. Bilateral simple appearing renal lesions, no specific follow-up imaging is necessary. Musculoskeletal: No chest wall mass or suspicious bone lesions identified. IMPRESSION: 1. Bilateral subsolid pulmonary nodules, including dominant part solid nodule of the left upper lobe, are unchanged in size when compared with the recent priors, however nodules are increased when compared with more remote priors. Findings are consistent with indolent primary lung adenocarcinoma. 2. Unchanged dilation of the right and left pulmonary arteries, findings can be seen in the setting of pulmonary hypertension. 3. Coronary artery calcifications, aortic Atherosclerosis (ICD10-I70.0) and Emphysema (ICD10-J43.9). Electronically Signed   By: Allegra Lai M.D.   On: 08/26/2023 11:15     Past medical hx Past Medical History:  Diagnosis Date   Atrial fibrillation (HCC)    a. dx 03/2019  while in hospital with cholecystitis   COPD (chronic obstructive pulmonary disease) (HCC)    Critical lower limb ischemia (HCC)    a. after TAVR developed ischemic L leg likely due to flap/closure, s/p emergent repair of left femoral artery with endarterectomy and Dacron patch angioplasty.   Emphysema lung (HCC)    Hyperlipidemia    LDL goal < 70   Hypertension    Hypomagnesemia    Normal coronary arteries 10/2018   NSVT (nonsustained ventricular tachycardia) (HCC) 03/02/2019   Pulmonary HTN (HCC) 06/05/2016   Moderate with PASP by echo 04/2016 likely Group 3 from COPD and possibly Group 2 from pulmonary venous HTN associated with moderate AS - f/u echo 12/2018 post TAVR showed normal RVSP   Pulmonary nodules    seen on pre TAVR CT, needs 12 month CT follow up   S/P TAVR (transcatheter aortic valve replacement) 12/30/2018   Medtronic Evolut Pro-Plus THV (size 26 mm, serial # [id number]) via the TF approach   Sacral fracture (HCC)    Severe aortic stenosis    a. s/p TAVR 12/2018.   Thoracic ascending aortic aneurysm (HCC)    needs yearly follow up   Thyroid nodule 11/08/2022     Social History   Tobacco Use   Smoking status: Former    Current packs/day: 0.00    Average packs/day: 1.5 packs/day for 48.4 years (72.6 ttl pk-yrs)    Types: Cigarettes    Start date: 34    Quit date: 03/19/2004    Years since quitting: 19.4   Smokeless tobacco: Never   Tobacco comments:    Started smoking at age 61  Vaping Use   Vaping status: Never Used  Substance Use Topics   Alcohol use: Yes    Alcohol/week: 3.0 - 4.0 standard drinks of alcohol    Types: 3 - 4 Glasses of wine per week    Comment: socially   Drug use: No    Ms.Novel reports that she quit smoking about 19 years ago. Her smoking use included cigarettes. She started smoking about 67 years ago. She has a 72.6 pack-year smoking history. She has never used smokeless tobacco. She reports current alcohol use of about 3.0 - 4.0  standard drinks of alcohol per week. She reports that she does not use drugs.  Tobacco Cessation: Former smoker with a 72.6 pack year smoking history. Quit 2005 Past surgical hx, Family hx, Social hx all reviewed.  Current Outpatient Medications on File Prior to Visit  Medication Sig   acetaminophen (TYLENOL) 325 MG tablet Take 2 tablets (650 mg total) by mouth every 6 (six) hours as needed for mild pain (or Fever >/= 101).  albuterol (VENTOLIN HFA) 108 (90 Base) MCG/ACT inhaler Inhale 2 puffs into the lungs every 6 (six) hours as needed for wheezing or shortness of breath.   amLODipine (NORVASC) 5 MG tablet Take 1 tablet (5 mg total) by mouth daily.   amoxicillin (AMOXIL) 500 MG tablet Take 4 tablets (2,000mg ) one hour prior to all dental visits.   aspirin EC 81 MG tablet Take 81 mg by mouth daily.   Budeson-Glycopyrrol-Formoterol (BREZTRI AEROSPHERE) 160-9-4.8 MCG/ACT AERO Inhale 2 puffs into the lungs in the morning and at bedtime.   Calcium Carb-Cholecalciferol (CALCIUM 600 + D PO) Take 600 mg by mouth 3 (three) times a week.    Cholecalciferol (VITAMIN D3) 5000 UNITS CAPS Take 5,000 Units by mouth daily.    denosumab (PROLIA) 60 MG/ML SOLN injection Inject 60 mg into the skin every 6 (six) months. Administer in upper arm, thigh, or abdomen   EPINEPHrine 0.3 mg/0.3 mL IJ SOAJ injection Inject 0.3 mg into the muscle as needed for anaphylaxis.   furosemide (LASIX) 20 MG tablet DAILY AS NEEDED FOR SWELLING   hydrochlorothiazide (MICROZIDE) 12.5 MG capsule TAKE 1 CAPSULE BY MOUTH EVERY DAY   loratadine (CLARITIN) 10 MG tablet Take 10 mg by mouth daily as needed for allergies.    magnesium oxide (MAG-OX) 400 (240 Mg) MG tablet Take 1 tablet (400 mg total) by mouth daily.   metoprolol succinate (TOPROL-XL) 25 MG 24 hr tablet TAKE 1 TABLET BY MOUTH EVERY DAY   Multiple Vitamin (MULTIVITAMIN WITH MINERALS) TABS tablet Take 1 tablet by mouth daily.   OXYGEN Inhale 2 L into the lungs continuous.    rosuvastatin (CRESTOR) 40 MG tablet TAKE 1 TABLET BY MOUTH EVERY DAY   No current facility-administered medications on file prior to visit.     No Known Allergies  Review Of Systems:  Constitutional:   No  weight loss, night sweats,  Fevers, chills, fatigue, or  lassitude.  HEENT:   No headaches,  Difficulty swallowing,  Tooth/dental problems, or  Sore throat,                No sneezing, itching, ear ache, nasal congestion, post nasal drip,+ difficulty breathing through her nose   CV:  No chest pain,  Orthopnea, PND, swelling in lower extremities, anasarca, dizziness, palpitations, syncope.   GI  No heartburn, indigestion, abdominal pain, nausea, vomiting, diarrhea, change in bowel habits, loss of appetite, bloody stools.   Resp: No shortness of breath with exertion or at rest.  No excess mucus, no productive cough,  No non-productive cough,  No coughing up of blood.  No change in color of mucus.  No wheezing.  No chest wall deformity  Skin: no rash or lesions.  GU: no dysuria, change in color of urine, no urgency or frequency.  No flank pain, no hematuria   MS:  No joint pain or swelling.  No decreased range of motion.  No back pain.  Psych:  No change in mood or affect. No depression or anxiety.  No memory loss.   Vital Signs BP (!) 144/70 (BP Location: Right Arm, Cuff Size: Normal)   Pulse 63   Ht 5\' 6"  (1.676 m)   Wt 165 lb 12.8 oz (75.2 kg)   SpO2 95%   BMI 26.76 kg/m    Physical Exam:  General- No distress,  A&Ox3 ENT: No sinus tenderness, TM clear, pale nasal mucosa, no oral exudate,no post nasal drip, no LAN Cardiac: S1, S2, regular rate and rhythm, no  murmur Chest: No wheeze/ rales/ dullness; no accessory muscle use, no nasal flaring, no sternal retractions Abd.: Soft Non-tender, ND, BS +, Body mass index is 26.76 kg/m.  Ext: No clubbing cyanosis, edema Neuro:  normal strength, MAE x 4, A&O x 3 Skin: No rashes, warm and dry, no lesions  Psych: normal mood  and behavior   Assessment/Plan Stable Pulmonary Nodules , concerning for indolent primary lung adenocarcinoma. Former smoker COPD, Clinical cytogeneticist for maintenance  Impaired nasal breathing Plan The CT Chest shows that the lung nodules we are following are stable in the last 3 months, but still concerning for an indolent bronchogenic cancer . We will do a 6 month follow up Ct chest  to continue surveillance, as you prefer this to biopsy.. This will be due 01/2024. You will get a call to get this scheduled closer to the time . Call us if you change your mind about having a biopsy. Continue Breztri 2 puffs, twice daily Rinse mouth after use. Albuerol as needed for breakthrough shortness of breath Use your oxygen at night time as you have been doing at 2 L . Use oxygen as needed during the day to maintain oxygen saturations > 88%. Follow up appointment in 6 months unless you need [place] sooner . I have referred you to ENT to be evaluated for your nasal difficulty in breathing.  You will get a call to get this scheduled. Please contact office for sooner follow up if symptoms do not improve or worsen or seek emergency care  .   I spent 20 minutes dedicated to the care of this patient on the date of this encounter to include pre-visit review of records, face-to-face time with the patient discussing conditions above, post visit ordering of testing, clinical documentation with the electronic health record, making appropriate referrals as documented, and communicating necessary information to the patient's healthcare team.   Bevelyn Ngo, NP 08/27/2023  11:25 AM

## 2023-08-27 NOTE — Patient Instructions (Addendum)
It is good to see you today. The CT Chest shows that the lung nodules we are following are stable in the last 3 months. We will do a 6 month follow up Ct chest  to continue surveillance. This will be due 01/2024. You will get a call to get this scheduled closer to the time . Continue Breztri 2 puffs, twice daily Rinse mouth after use. Albuerol as needed for breakthrough shortness of breath Use your oxygen at night time as you have been doing at 2 L . Use oxygen as needed during the day to maintain oxygen saturations > 88%. Follow up appointment in 6 months unless you need [place] sooner . I have referred you to ENT to be evaluated for your nasal difficulty in breathing.  You will get a call to get this scheduled. Please contact office for sooner follow up if symptoms do not improve or worsen or seek emergency care

## 2023-08-28 ENCOUNTER — Ambulatory Visit: Payer: Medicare Other | Admitting: *Deleted

## 2023-08-28 DIAGNOSIS — Z0000 Encounter for general adult medical examination without abnormal findings: Secondary | ICD-10-CM | POA: Diagnosis not present

## 2023-08-28 NOTE — Progress Notes (Signed)
Subjective:   AAIMA HASE is a 82 y.o. female who presents for Medicare Annual (Subsequent) preventive examination.  Visit Complete: Virtual I connected with  Dominique [name] on 08/28/23 by a audio enabled telemedicine application and verified that I am speaking with the correct person using two identifiers.  Patient Location: Home  Provider Location: Office/Clinic  I discussed the limitations of evaluation and management by telemedicine. The patient expressed understanding and agreed to proceed.  Vital Signs: Because this visit was a virtual/telehealth visit, some criteria may be missing or patient reported. Any vitals not documented were not able to be obtained and vitals that have been documented are patient reported.  Patient Medicare AWV questionnaire was completed by the patient on 08/22/23; I have confirmed that all information answered by patient is correct and no changes since this date.  Cardiac Risk Factors include: advanced age (>54men, >29 women);dyslipidemia;hypertension     Objective:    There were no vitals filed for this visit. There is no height or weight on file to calculate BMI.     08/28/2023    1:01 PM 08/20/2022   11:42 AM 08/17/2021   11:02 AM 05/20/2021   10:47 AM 06/27/2020    9:28 AM 06/08/2019    2:27 PM 06/02/2019   11:25 AM  Advanced Directives  Does Patient Have a Medical Advance Directive? Yes Yes Yes Yes Yes Yes Yes  Type of Estate agent of [place];Living will Healthcare Power of [place];Living will Healthcare Power of [place];Living will Healthcare Power of [place];Living will Healthcare Power of [place];Living will Living will;Healthcare Power of Attorney   Does patient want to make changes to medical advance directive? No - Patient declined No - Patient declined   No - Patient declined    Copy of Healthcare Power of Attorney in Chart? Yes - validated most recent copy scanned in chart (See row information) Yes -  validated most recent copy scanned in chart (See row information) Yes - validated most recent copy scanned in chart (See row information) No - copy requested No - copy requested      Current Medications (verified) Outpatient Encounter Medications as of 08/28/2023  Medication Sig   acetaminophen (TYLENOL) 325 MG tablet Take 2 tablets (650 mg total) by mouth every 6 (six) hours as needed for mild pain (or Fever >/= 101).   albuterol (VENTOLIN HFA) 108 (90 Base) MCG/ACT inhaler Inhale 2 puffs into the lungs every 6 (six) hours as needed for wheezing or shortness of breath.   amLODipine (NORVASC) 5 MG tablet Take 1 tablet (5 mg total) by mouth daily.   amoxicillin (AMOXIL) 500 MG tablet Take 4 tablets (2,000mg ) one hour prior to all dental visits.   aspirin EC 81 MG tablet Take 81 mg by mouth daily.   Budeson-Glycopyrrol-Formoterol (BREZTRI AEROSPHERE) 160-9-4.8 MCG/ACT AERO Inhale 2 puffs into the lungs in the morning and at bedtime.   Calcium Carb-Cholecalciferol (CALCIUM 600 + D PO) Take 600 mg by mouth 3 (three) times a week.    Cholecalciferol (VITAMIN D3) 5000 UNITS CAPS Take 5,000 Units by mouth daily.    denosumab (PROLIA) 60 MG/ML SOLN injection Inject 60 mg into the skin every 6 (six) months. Administer in upper arm, thigh, or abdomen   EPINEPHrine 0.3 mg/0.3 mL IJ SOAJ injection Inject 0.3 mg into the muscle as needed for anaphylaxis.   furosemide (LASIX) 20 MG tablet DAILY AS NEEDED FOR SWELLING   hydrochlorothiazide (MICROZIDE) 12.5 MG capsule TAKE 1 CAPSULE  BY MOUTH EVERY DAY   loratadine (CLARITIN) 10 MG tablet Take 10 mg by mouth daily as needed for allergies.    magnesium oxide (MAG-OX) 400 (240 Mg) MG tablet Take 1 tablet (400 mg total) by mouth daily.   metoprolol succinate (TOPROL-XL) 25 MG 24 hr tablet TAKE 1 TABLET BY MOUTH EVERY DAY   Multiple Vitamin (MULTIVITAMIN WITH MINERALS) TABS tablet Take 1 tablet by mouth daily.   OXYGEN Inhale 2 L into the lungs continuous.    rosuvastatin (CRESTOR) 40 MG tablet TAKE 1 TABLET BY MOUTH EVERY DAY   No facility-administered encounter medications on file as of 08/28/2023.    Allergies (verified) Patient has no known allergies.   History: Past Medical History:  Diagnosis Date   Allergy    Atrial fibrillation (HCC)    a. dx 03/2019 while in hospital with cholecystitis   COPD (chronic obstructive pulmonary disease) (HCC)    Critical lower limb ischemia (HCC)    a. after TAVR developed ischemic L leg likely due to flap/closure, s/p emergent repair of left femoral artery with endarterectomy and Dacron patch angioplasty.   Emphysema lung (HCC)    Emphysema of lung (HCC)    GERD (gastroesophageal reflux disease)    Heart murmur    Hyperlipidemia    LDL goal < 70   Hypertension    Hypomagnesemia    Normal coronary arteries 10/2018   NSVT (nonsustained ventricular tachycardia) (HCC) 03/02/2019   Osteoporosis    Oxygen deficiency    Pulmonary HTN (HCC) 06/05/2016   Moderate with PASP by echo 04/2016 likely Group 3 from COPD and possibly Group 2 from pulmonary venous HTN associated with moderate AS - f/u echo 12/2018 post TAVR showed normal RVSP   Pulmonary nodules    seen on pre TAVR CT, needs 12 month CT follow up   S/P TAVR (transcatheter aortic valve replacement) 12/30/2018   Medtronic Evolut Pro-Plus THV (size 26 mm, serial # [id number]) via the TF approach   Sacral fracture (HCC)    Severe aortic stenosis    a. s/p TAVR 12/2018.   Thoracic ascending aortic aneurysm (HCC)    needs yearly follow up   Thyroid nodule 11/08/2022   Past Surgical History:  Procedure Laterality Date   APPENDECTOMY  1951   CARDIAC VALVE REPLACEMENT  01/26/19   CATARACT EXTRACTION     CESAREAN SECTION     1971   CHOLECYSTECTOMY N/A 04/28/2019   Procedure: LAPAROSCOPIC CHOLECYSTECTOMY;  Surgeon: Almond Lint, MD;  Location: [facility];  Service: General;  Laterality: N/A;   COLONOSCOPY  2018   EYE SURGERY     FEMORAL-POPLITEAL  BYPASS GRAFT Left 12/31/2018   Procedure: PATCH ANGIOPLASTY REPAIR LEFT FEMORAL ARTERY USING HEMASHIELD PLATINUM FINESSE PATCH;  Surgeon: Larina Earthly, MD;  Location: [facility] OR;  Service: Vascular;  Laterality: Left;   IR RADIOLOGY PERIPHERAL GUIDED IV START  05/30/2017   IR RADIOLOGY PERIPHERAL GUIDED IV START  09/02/2018   IR RADIOLOGY PERIPHERAL GUIDED IV START  09/02/2018   IR US GUIDE VASC ACCESS LEFT  09/02/2018   IR US GUIDE VASC ACCESS RIGHT  05/30/2017   IR US GUIDE VASC ACCESS RIGHT  09/02/2018   JOINT REPLACEMENT  2016   sacroplasty   LAMINECTOMY     RIGHT/LEFT HEART CATH AND CORONARY ANGIOGRAPHY N/A 11/27/2018   Procedure: RIGHT/LEFT HEART CATH AND CORONARY ANGIOGRAPHY;  Surgeon: Tonny Bollman, MD;  Location: [facility] INVASIVE CV LAB;  Service: Cardiovascular;  Laterality: N/A;  TEE WITHOUT CARDIOVERSION N/A 12/30/2018   Procedure: TRANSESOPHAGEAL ECHOCARDIOGRAM (TEE);  Surgeon: Tonny Bollman, MD;  Location: [facility] INVASIVE CV LAB;  Service: Open Heart Surgery;  Laterality: N/A;   TOTAL HIP ARTHROPLASTY Bilateral    TRANSCATHETER AORTIC VALVE REPLACEMENT, TRANSFEMORAL N/A 12/30/2018   Procedure: TRANSCATHETER AORTIC VALVE REPLACEMENT, TRANSFEMORAL;  Surgeon: Tonny Bollman, MD;  Location: [facility] INVASIVE CV LAB;  Service: Open Heart Surgery;  Laterality: N/A;   TUBAL LIGATION  1975   Family History  Problem Relation Age of Onset   Breast cancer Mother        breast   Diabetes Son        type 1   Cancer Brother        lung   Colon cancer Neg Hx    CAD Neg Hx    Colon polyps Neg Hx    Rectal cancer Neg Hx    Stomach cancer Neg Hx    Social History   Socioeconomic History   Marital status: Widowed    Spouse name: Not on file   Number of children: 2   Years of education: Not on file   Highest education level: Not on file  Occupational History   Occupation: n/a  Tobacco Use   Smoking status: Former    Current packs/day: 0.00    Average packs/day: 1.5 packs/day for 48.4  years (72.6 ttl pk-yrs)    Types: Cigarettes    Start date: 64    Quit date: 03/19/2004    Years since quitting: 19.4   Smokeless tobacco: Never   Tobacco comments:    Started smoking at age 8  Vaping Use   Vaping status: Never Used  Substance and Sexual Activity   Alcohol use: Yes    Alcohol/week: 3.0 - 4.0 standard drinks of alcohol    Types: 3 - 4 Glasses of wine per week    Comment: socially   Drug use: No   Sexual activity: Not Currently  Other Topics Concern   Not on file  Social History Narrative   From [place]   Lost a son to DM   1 living son   Social Determinants of Health   Financial Resource Strain: Low Risk  (08/22/2023)   Overall Financial Resource Strain (CARDIA)    Difficulty of Paying Living Expenses: Not hard at all  Food Insecurity: No Food Insecurity (08/22/2023)   Hunger Vital Sign    Worried About Running Out of Food in the Last Year: Never true    Ran Out of Food in the Last Year: Never true  Transportation Needs: No Transportation Needs (08/22/2023)   PRAPARE - Administrator, Civil Service (Medical): No    Lack of Transportation (Non-Medical): No  Physical Activity: Insufficiently Active (08/22/2023)   Exercise Vital Sign    Days of Exercise per Week: 3 days    Minutes of Exercise per Session: 10 min  Stress: No Stress Concern Present (08/22/2023)   [employer] of Occupational Health - Occupational Stress Questionnaire    Feeling of Stress : Not at all  Social Connections: Unknown (08/22/2023)   Social Connection and Isolation Panel [NHANES]    Frequency of Communication with Friends and Family: More than three times a week    Frequency of Social Gatherings with Friends and Family: Three times a week    Attends Religious Services: Patient declined    Active Member of Clubs or Organizations: Yes    Attends Banker Meetings: More than  4 times per year    Marital Status: Widowed    Tobacco  Counseling Counseling given: Not Answered Tobacco comments: Started smoking at age 57   Clinical Intake:  Pre-visit preparation completed: Yes  Pain : No/denies pain     BMI - recorded: 26.76 Nutritional Status: BMI 25 -29 Overweight Nutritional Risks: None Diabetes: No  How often do you need to have someone help you when you read instructions, pamphlets, or other written materials from your doctor or pharmacy?: 1 - Never  Interpreter Needed?: No  Information entered by :: Donne Anon, CMA   Activities of Daily Living    08/22/2023    1:05 PM  In your present state of health, do you have any difficulty performing the following activities:  Hearing? 0  Vision? 0  Difficulty concentrating or making decisions? 0  Walking or climbing stairs? 0  Dressing or bathing? 0  Doing errands, shopping? 0  Preparing Food and eating ? N  Using the Toilet? N  In the past six months, have you accidently leaked urine? N  Do you have problems with loss of bowel control? N  Managing your Medications? N  Managing your Finances? N  Housekeeping or managing your Housekeeping? N    Patient Care Team: Wanda Plump, MD as PCP - General (Internal Medicine) Chilton Si, MD as PCP - Cardiology (Cardiology) Dr Tasia Catch[facility] as Consulting Physician [facility]) [facility] (Orthopedic Surgery) Mitchel Honour, DO as Consulting Physician (Obstetrics and Gynecology) Henrene Pastor, RPH-CPP (Pharmacist)  Indicate any recent Medical Services you may have received from other than [facility] providers in the past year (date may be approximate).     Assessment:   This is a routine wellness examination for [place].  Hearing/Vision screen No results found.   Goals Addressed   None    Depression Screen    08/28/2023    1:06 PM 08/06/2023   10:31 AM 03/06/2023   10:52 AM 10/17/2022   10:33 AM 08/20/2022   11:43 AM 03/29/2022   10:40 AM 09/28/2021    9:12 AM  PHQ  2/9 Scores  PHQ - 2 Score 0 0 0 0 0 0 0    Fall Risk    08/22/2023    1:05 PM 08/06/2023   10:31 AM 03/06/2023   10:52 AM 10/17/2022   10:33 AM 08/20/2022   11:42 AM  Fall Risk   Falls in the past year? 0 0 0 0 0  Number falls in past yr: 0 0 0 0 0  Injury with Fall? 0 0 0 0 0  Risk for fall due to : No Fall Risks    No Fall Risks  Follow up Falls evaluation completed Falls evaluation completed Falls evaluation completed Falls evaluation completed Falls evaluation completed    MEDICARE RISK AT HOME: Medicare Risk at Home Any stairs in or around the home?: No If so, are there any without handrails?: No Home free of loose throw rugs in walkways, pet beds, electrical cords, etc?: No Adequate lighting in your home to reduce risk of falls?: Yes Life alert?: No Use of a cane, walker or w/c?: No Grab bars in the bathroom?: No Shower chair or bench in shower?: Yes Elevated toilet seat or a handicapped toilet?: No  TIMED UP AND GO:  Was the test performed?  No    Cognitive Function:        08/28/2023    1:07 PM 08/20/2022   11:50 AM  6CIT Screen  What Year? 0 points 0 points  What month? 0 points 0 points  What time? 0 points 0 points  Count back from 20 2 points 2 points  Months in reverse 0 points 2 points  Repeat phrase 0 points 2 points  Total Score 2 points 6 points    Immunizations Immunization History  Administered Date(s) Administered   Fluad Quad(high Dose 65+) 07/14/2019, 08/23/2020   Influenza, High Dose Seasonal PF 10/03/2016, 08/07/2018, 08/04/2022   Influenza-Unspecified 09/06/2015, 07/18/2017, 08/03/2021   Moderna Covid-19 Fall Seasonal Vaccine 45yrs & older 08/15/2023   Moderna Sars-Covid-2 Vaccination 12/01/2019, 12/31/2019, 07/05/2020   PFIZER Comirnaty(Gray Top)Covid-19 Tri-Sucrose Vaccine 08/04/2022   Pfizer Covid-19 Vaccine Bivalent Booster 54yrs & up 08/03/2021   Pneumococcal Conjugate-13 09/27/2014   Pneumococcal Polysaccharide-23 10/29/2010,  12/27/2020, 09/28/2021   Td 04/24/2016   Zoster Recombinant(Shingrix) 10/18/2017, 03/04/2018    TDAP status: Up to date  Flu Vaccine status: Up to date  Pneumococcal vaccine status: Up to date  Covid-19 vaccine status: Completed vaccines  Qualifies for Shingles Vaccine? Yes   Zostavax completed No   Shingrix Completed?: Yes  Screening Tests Health Maintenance  Topic Date Due   Medicare Annual Wellness (AWV)  08/21/2023   COVID-19 Vaccine (7 - 2023-24 season) 10/10/2023   DTaP/Tdap/Td (2 - Tdap) 04/24/2026   Pneumonia Vaccine 73+ Years old  Completed   INFLUENZA VACCINE  Completed   DEXA SCAN  Completed   Zoster Vaccines- Shingrix  Completed   HPV VACCINES  Aged Out    Health Maintenance  Health Maintenance Due  Topic Date Due   Medicare Annual Wellness (AWV)  08/21/2023    Colorectal cancer screening: No longer required.   Mammogram status: No longer required due to age.  Bone Density status: pt declined  Lung Cancer Screening: (Low Dose CT Chest recommended if Age 76-80 years, 20 pack-year currently smoking OR have quit w/in 15years.) does not qualify.   Additional Screening:  Hepatitis C Screening: does not qualify  Vision Screening: Recommended annual ophthalmology exams for early detection of glaucoma and other disorders of the eye. Is the patient up to date with their annual eye exam?  Yes  Who is the provider or what is the name of the office in which the patient attends annual eye exams? [facility] Dr. Sherrine Maples If pt is not established with a provider, would they like to be referred to a provider to establish care? No .   Dental Screening: Recommended annual dental exams for proper oral hygiene  Diabetic Foot Exam: N/a  Community Resource Referral / Chronic Care Management: CRR required this visit?  No   CCM required this visit?  No     Plan:     I have personally reviewed and noted the following in the patient's chart:   Medical and  social history Use of alcohol, tobacco or illicit drugs  Current medications and supplements including opioid prescriptions. Patient is not currently taking opioid prescriptions. Functional ability and status Nutritional status Physical activity Advanced directives List of other physicians Hospitalizations, surgeries, and ER visits in previous 12 months Vitals Screenings to include cognitive, depression, and falls Referrals and appointments  In addition, I have reviewed and discussed with patient certain preventive protocols, quality metrics, and best practice recommendations. A written personalized care plan for preventive services as well as general preventive health recommendations were provided to patient.     Donne Anon, CMA   08/28/2023   After Visit Summary: (MyChart) Due  to this being a telephonic visit, the after visit summary with patients personalized plan was offered to patient via MyChart   Nurse Notes: None

## 2023-08-28 NOTE — Patient Instructions (Signed)
Dominique [name] , Thank you for taking time to come for your Medicare Wellness Visit. I appreciate your ongoing commitment to your health goals. Please review the following plan we discussed and let me know if I can assist you in the future.      This is a list of the screening recommended for you and due dates:  Health Maintenance  Topic Date Due   COVID-19 Vaccine (7 - 2023-24 season) 10/10/2023   Medicare Annual Wellness Visit  08/27/2024   DTaP/Tdap/Td vaccine (2 - Tdap) 04/24/2026   Pneumonia Vaccine  Completed   Flu Shot  Completed   DEXA scan (bone density measurement)  Completed   Zoster (Shingles) Vaccine  Completed   HPV Vaccine  Aged Out    Next appointment: Follow up in one year for your annual wellness visit.   Preventive Care 31 Years and Older, Female Preventive care refers to lifestyle choices and visits with your health care provider that can promote health and wellness. What does preventive care include? A yearly physical exam. This is also called an annual well check. Dental exams once or twice a year. Routine eye exams. Ask your health care provider how often you should have your eyes checked. Personal lifestyle choices, including: Daily care of your teeth and gums. Regular physical activity. Eating a healthy diet. Avoiding tobacco and drug use. Limiting alcohol use. Practicing safe sex. Taking low-dose aspirin every day. Taking vitamin and mineral supplements as recommended by your health care provider. What happens during an annual well check? The services and screenings done by your health care provider during your annual well check will depend on your age, overall health, lifestyle risk factors, and family history of disease. Counseling  Your health care provider may ask you questions about your: Alcohol use. Tobacco use. Drug use. Emotional well-being. Home and relationship well-being. Sexual activity. Eating habits. History of falls. Memory and  ability to understand (cognition). Work and work Astronomer. Reproductive health. Screening  You may have the following tests or measurements: Height, weight, and BMI. Blood pressure. Lipid and cholesterol levels. These may be checked every 5 years, or more frequently if you are over 46 years old. Skin check. Lung cancer screening. You may have this screening every year starting at age 41 if you have a 30-pack-year history of smoking and currently smoke or have quit within the past 15 years. Fecal occult blood test (FOBT) of the stool. You may have this test every year starting at age 22. Flexible sigmoidoscopy or colonoscopy. You may have a sigmoidoscopy every 5 years or a colonoscopy every 10 years starting at age 57. Hepatitis C blood test. Hepatitis B blood test. Sexually transmitted disease (STD) testing. Diabetes screening. This is done by checking your blood sugar (glucose) after you have not eaten for a while (fasting). You may have this done every 1-3 years. Bone density scan. This is done to screen for osteoporosis. You may have this done starting at age 50. Mammogram. This may be done every 1-2 years. Talk to your health care provider about how often you should have regular mammograms. Talk with your health care provider about your test results, treatment options, and if necessary, the need for more tests. Vaccines  Your health care provider may recommend certain vaccines, such as: Influenza vaccine. This is recommended every year. Tetanus, diphtheria, and acellular pertussis (Tdap, Td) vaccine. You may need a Td booster every 10 years. Zoster vaccine. You may need this after age 60. Pneumococcal 13-valent  conjugate (PCV13) vaccine. One dose is recommended after age 10. Pneumococcal polysaccharide (PPSV23) vaccine. One dose is recommended after age 55. Talk to your health care provider about which screenings and vaccines you need and how often you need them. This information is  not intended to replace advice given to you by your health care provider. Make sure you discuss any questions you have with your health care provider. Document Released: 11/11/2015 Document Revised: 07/04/2016 Document Reviewed: 08/16/2015 Elsevier Interactive Patient Education  2017 [employer].  Fall Prevention in the Home Falls can cause injuries. They can happen to people of all ages. There are many things you can do to make your home safe and to help prevent falls. What can I do on the outside of my home? Regularly fix the edges of walkways and driveways and fix any cracks. Remove anything that might make you trip as you walk through a door, such as a raised step or threshold. Trim any bushes or trees on the path to your home. Use bright outdoor lighting. Clear any walking paths of anything that might make someone trip, such as rocks or tools. Regularly check to see if handrails are loose or broken. Make sure that both sides of any steps have handrails. Any raised decks and porches should have guardrails on the edges. Have any leaves, snow, or ice cleared regularly. Use sand or salt on walking paths during winter. Clean up any spills in your garage right away. This includes oil or grease spills. What can I do in the bathroom? Use night lights. Install grab bars by the toilet and in the tub and shower. Do not use towel bars as grab bars. Use non-skid mats or decals in the tub or shower. If you need to sit down in the shower, use a plastic, non-slip stool. Keep the floor dry. Clean up any water that spills on the floor as soon as it happens. Remove soap buildup in the tub or shower regularly. Attach bath mats securely with double-sided non-slip rug tape. Do not have throw rugs and other things on the floor that can make you trip. What can I do in the bedroom? Use night lights. Make sure that you have a light by your bed that is easy to reach. Do not use any sheets or blankets that  are too big for your bed. They should not hang down onto the floor. Have a firm chair that has side arms. You can use this for support while you get dressed. Do not have throw rugs and other things on the floor that can make you trip. What can I do in the kitchen? Clean up any spills right away. Avoid walking on wet floors. Keep items that you use a lot in easy-to-reach places. If you need to reach something above you, use a strong step stool that has a grab bar. Keep electrical cords out of the way. Do not use floor polish or wax that makes floors slippery. If you must use wax, use non-skid floor wax. Do not have throw rugs and other things on the floor that can make you trip. What can I do with my stairs? Do not leave any items on the stairs. Make sure that there are handrails on both sides of the stairs and use them. Fix handrails that are broken or loose. Make sure that handrails are as long as the stairways. Check any carpeting to make sure that it is firmly attached to the stairs. Fix any carpet that  is loose or worn. Avoid having throw rugs at the top or bottom of the stairs. If you do have throw rugs, attach them to the floor with carpet tape. Make sure that you have a light switch at the top of the stairs and the bottom of the stairs. If you do not have them, ask someone to add them for you. What else can I do to help prevent falls? Wear shoes that: Do not have high heels. Have rubber bottoms. Are comfortable and fit you well. Are closed at the toe. Do not wear sandals. If you use a stepladder: Make sure that it is fully opened. Do not climb a closed stepladder. Make sure that both sides of the stepladder are locked into place. Ask someone to hold it for you, if possible. Clearly mark and make sure that you can see: Any grab bars or handrails. First and last steps. Where the edge of each step is. Use tools that help you move around (mobility aids) if they are needed. These  include: Canes. Walkers. Scooters. Crutches. Turn on the lights when you go into a dark area. Replace any light bulbs as soon as they burn out. Set up your furniture so you have a clear path. Avoid moving your furniture around. If any of your floors are uneven, fix them. If there are any pets around you, be aware of where they are. Review your medicines with your doctor. Some medicines can make you feel dizzy. This can increase your chance of falling. Ask your doctor what other things that you can do to help prevent falls. This information is not intended to replace advice given to you by your health care provider. Make sure you discuss any questions you have with your health care provider. Document Released: 08/11/2009 Document Revised: 03/22/2016 Document Reviewed: 11/19/2014 Elsevier Interactive Patient Education  2017 [employer].

## 2023-08-29 DIAGNOSIS — Z961 Presence of intraocular lens: Secondary | ICD-10-CM | POA: Diagnosis not present

## 2023-08-29 DIAGNOSIS — H26493 Other secondary cataract, bilateral: Secondary | ICD-10-CM | POA: Diagnosis not present

## 2023-08-29 DIAGNOSIS — H43393 Other vitreous opacities, bilateral: Secondary | ICD-10-CM | POA: Diagnosis not present

## 2023-08-29 DIAGNOSIS — H31091 Other chorioretinal scars, right eye: Secondary | ICD-10-CM | POA: Diagnosis not present

## 2023-08-30 ENCOUNTER — Encounter (INDEPENDENT_AMBULATORY_CARE_PROVIDER_SITE_OTHER): Payer: Self-pay | Admitting: Otolaryngology

## 2023-09-04 DIAGNOSIS — D225 Melanocytic nevi of trunk: Secondary | ICD-10-CM | POA: Diagnosis not present

## 2023-09-04 DIAGNOSIS — R202 Paresthesia of skin: Secondary | ICD-10-CM | POA: Diagnosis not present

## 2023-09-04 DIAGNOSIS — L821 Other seborrheic keratosis: Secondary | ICD-10-CM | POA: Diagnosis not present

## 2023-09-04 DIAGNOSIS — L814 Other melanin hyperpigmentation: Secondary | ICD-10-CM | POA: Diagnosis not present

## 2023-09-04 DIAGNOSIS — L578 Other skin changes due to chronic exposure to nonionizing radiation: Secondary | ICD-10-CM | POA: Diagnosis not present

## 2023-09-28 ENCOUNTER — Other Ambulatory Visit (HOSPITAL_BASED_OUTPATIENT_CLINIC_OR_DEPARTMENT_OTHER): Payer: Self-pay | Admitting: Family

## 2023-10-09 ENCOUNTER — Ambulatory Visit (INDEPENDENT_AMBULATORY_CARE_PROVIDER_SITE_OTHER): Payer: Medicare Other | Admitting: Otolaryngology

## 2023-10-09 ENCOUNTER — Encounter (INDEPENDENT_AMBULATORY_CARE_PROVIDER_SITE_OTHER): Payer: Self-pay

## 2023-10-09 VITALS — Ht 66.0 in | Wt 158.0 lb

## 2023-10-09 DIAGNOSIS — J343 Hypertrophy of nasal turbinates: Secondary | ICD-10-CM | POA: Diagnosis not present

## 2023-10-09 DIAGNOSIS — J310 Chronic rhinitis: Secondary | ICD-10-CM | POA: Insufficient documentation

## 2023-10-09 DIAGNOSIS — R0981 Nasal congestion: Secondary | ICD-10-CM | POA: Diagnosis not present

## 2023-10-09 NOTE — Progress Notes (Signed)
Patient ID: Dominique [name], female   DOB: 01/12/1941, 82 y.o.   MRN: [id number]  CC: Chronic nasal obstruction  HPI:  Dominique [name] is a 82 y.o. female who presents today complaining of chronic nasal obstruction for 10+ years.  The severity of her nasal obstruction has worsened over the past year.  The patient was started on oxygen via nasal cannula 3 months ago to treat her COPD.  She has no previous ENT surgery.  She has a history of environmental allergies.  She uses Claritin and Flonase as needed.  She denies any recent sinusitis.  Currently she denies any facial pain, fever, or visual change.  Past Medical History:  Diagnosis Date   Allergy    Atrial fibrillation (HCC)    a. dx 03/2019 while in hospital with cholecystitis   COPD (chronic obstructive pulmonary disease) (HCC)    Critical lower limb ischemia (HCC)    a. after TAVR developed ischemic L leg likely due to flap/closure, s/p emergent repair of left femoral artery with endarterectomy and Dacron patch angioplasty.   Emphysema lung (HCC)    Emphysema of lung (HCC)    GERD (gastroesophageal reflux disease)    Heart murmur    Hyperlipidemia    LDL goal < 70   Hypertension    Hypomagnesemia    Normal coronary arteries 10/2018   NSVT (nonsustained ventricular tachycardia) (HCC) 03/02/2019   Osteoporosis    Oxygen deficiency    Pulmonary HTN (HCC) 06/05/2016   Moderate with PASP by echo 04/2016 likely Group 3 from COPD and possibly Group 2 from pulmonary venous HTN associated with moderate AS - f/u echo 12/2018 post TAVR showed normal RVSP   Pulmonary nodules    seen on pre TAVR CT, needs 12 month CT follow up   S/P TAVR (transcatheter aortic valve replacement) 12/30/2018   Medtronic Evolut Pro-Plus THV (size 26 mm, serial # [id number]) via the TF approach   Sacral fracture (HCC)    Severe aortic stenosis    a. s/p TAVR 12/2018.   Thoracic ascending aortic aneurysm (HCC)    needs yearly follow up   Thyroid nodule  11/08/2022    Past Surgical History:  Procedure Laterality Date   APPENDECTOMY  1951   CARDIAC VALVE REPLACEMENT  01/26/19   CATARACT EXTRACTION     CESAREAN SECTION     1971   CHOLECYSTECTOMY N/A 04/28/2019   Procedure: LAPAROSCOPIC CHOLECYSTECTOMY;  Surgeon: Almond Lint, MD;  Location: [facility];  Service: General;  Laterality: N/A;   COLONOSCOPY  2018   EYE SURGERY     FEMORAL-POPLITEAL BYPASS GRAFT Left 12/31/2018   Procedure: PATCH ANGIOPLASTY REPAIR LEFT FEMORAL ARTERY USING HEMASHIELD PLATINUM FINESSE PATCH;  Surgeon: Larina Earthly, MD;  Location: [facility] OR;  Service: Vascular;  Laterality: Left;   IR RADIOLOGY PERIPHERAL GUIDED IV START  05/30/2017   IR RADIOLOGY PERIPHERAL GUIDED IV START  09/02/2018   IR RADIOLOGY PERIPHERAL GUIDED IV START  09/02/2018   IR US GUIDE VASC ACCESS LEFT  09/02/2018   IR US GUIDE VASC ACCESS RIGHT  05/30/2017   IR US GUIDE VASC ACCESS RIGHT  09/02/2018   JOINT REPLACEMENT  2016   sacroplasty   LAMINECTOMY     RIGHT/LEFT HEART CATH AND CORONARY ANGIOGRAPHY N/A 11/27/2018   Procedure: RIGHT/LEFT HEART CATH AND CORONARY ANGIOGRAPHY;  Surgeon: Tonny Bollman, MD;  Location: [facility] INVASIVE CV LAB;  Service: Cardiovascular;  Laterality: N/A;   TEE WITHOUT CARDIOVERSION N/A 12/30/2018  Procedure: TRANSESOPHAGEAL ECHOCARDIOGRAM (TEE);  Surgeon: Tonny Bollman, MD;  Location: [facility] INVASIVE CV LAB;  Service: Open Heart Surgery;  Laterality: N/A;   TOTAL HIP ARTHROPLASTY Bilateral    TRANSCATHETER AORTIC VALVE REPLACEMENT, TRANSFEMORAL N/A 12/30/2018   Procedure: TRANSCATHETER AORTIC VALVE REPLACEMENT, TRANSFEMORAL;  Surgeon: Tonny Bollman, MD;  Location: [facility] INVASIVE CV LAB;  Service: Open Heart Surgery;  Laterality: N/A;   TUBAL LIGATION  1975    Family History  Problem Relation Age of Onset   Breast cancer Mother        breast   Diabetes Son        type 1   Cancer Brother        lung   Colon cancer Neg Hx    CAD Neg Hx    Colon polyps Neg Hx     Rectal cancer Neg Hx    Stomach cancer Neg Hx     Social History:  reports that she quit smoking about 19 years ago. Her smoking use included cigarettes. She started smoking about 67 years ago. She has a 72.6 pack-year smoking history. She has never used smokeless tobacco. She reports current alcohol use of about 3.0 - 4.0 standard drinks of alcohol per week. She reports that she does not use drugs.  Allergies: No Known Allergies  Prior to Admission medications   Medication Sig Start Date End Date Taking? Authorizing Provider  acetaminophen (TYLENOL) 325 MG tablet Take 2 tablets (650 mg total) by mouth every 6 (six) hours as needed for mild pain (or Fever >/= 101). 04/29/19  Yes Juliet Rude, PA-C  albuterol (VENTOLIN HFA) 108 (90 Base) MCG/ACT inhaler Inhale 2 puffs into the lungs every 6 (six) hours as needed for wheezing or shortness of breath. 07/09/22  Yes Bevelyn Ngo, NP  amLODipine (NORVASC) 5 MG tablet Take 1 tablet (5 mg total) by mouth daily. 06/27/23  Yes Wanda Plump, MD  aspirin EC 81 MG tablet Take 81 mg by mouth daily.   Yes [provider]  Budeson-Glycopyrrol-Formoterol (BREZTRI AEROSPHERE) 160-9-4.8 MCG/ACT AERO Inhale 2 puffs into the lungs in the morning and at bedtime. 01/09/23  Yes Icard, Rachel Bo, DO  Calcium Carb-Cholecalciferol (CALCIUM 600 + D PO) Take 600 mg by mouth 3 (three) times a week.    Yes [provider]  Cholecalciferol (VITAMIN D3) 5000 UNITS CAPS Take 5,000 Units by mouth daily.    Yes [provider]  denosumab (PROLIA) 60 MG/ML SOLN injection Inject 60 mg into the skin every 6 (six) months. Administer in upper arm, thigh, or abdomen   Yes [provider]  EPINEPHrine 0.3 mg/0.3 mL IJ SOAJ injection Inject 0.3 mg into the muscle as needed for anaphylaxis. 12/01/17  Yes [provider]  furosemide (LASIX) 20 MG tablet DAILY AS NEEDED FOR SWELLING 09/02/20  Yes Chilton Si, MD  hydrochlorothiazide (MICROZIDE)  12.5 MG capsule TAKE 1 CAPSULE BY MOUTH EVERY DAY 06/27/23  Yes Chilton Si, MD  loratadine (CLARITIN) 10 MG tablet Take 10 mg by mouth daily as needed for allergies.    Yes [provider]  magnesium oxide (MAG-OX) 400 (240 Mg) MG tablet TAKE 1 TABLET BY MOUTH EVERY DAY 10/01/23  Yes Alver Sorrow, NP  metoprolol succinate (TOPROL-XL) 25 MG 24 hr tablet TAKE 1 TABLET BY MOUTH EVERY DAY 12/05/22  Yes Chilton Si, MD  Multiple Vitamin (MULTIVITAMIN WITH MINERALS) TABS tablet Take 1 tablet by mouth daily.   Yes [provider]  OXYGEN Inhale 2 L into the lungs continuous.   Yes [provider]  rosuvastatin (CRESTOR) 40 MG tablet TAKE 1 TABLET BY MOUTH EVERY DAY 06/24/23  Yes Chilton Si, MD  amoxicillin (AMOXIL) 500 MG tablet Take 4 tablets (2,000mg ) one hour prior to all dental visits. Patient not taking: Reported on 10/09/2023 01/07/23   Chilton Si, MD    Height 5\' 6"  (1.676 m), weight 158 lb (71.7 kg). Exam: General: Communicates without difficulty, well nourished, no acute distress. Head: Normocephalic, no evidence injury, no tenderness, facial buttresses intact without stepoff. Face/sinus: No tenderness to palpation and percussion. Facial movement is normal and symmetric. Eyes: PERRL, EOMI. No scleral icterus, conjunctivae clear. Neuro: CN II exam reveals vision grossly intact.  No nystagmus at any point of gaze. Ears: Auricles well formed without lesions.  Ear canals are intact without mass or lesion.  No erythema or edema is appreciated.  The TMs are intact without fluid. Nose: External evaluation reveals normal support and skin without lesions.  Dorsum is intact.  Anterior rhinoscopy reveals congested mucosa over anterior aspect of inferior turbinates and intact septum.  No purulence noted. Oral:  Oral cavity and oropharynx are intact, symmetric, without erythema or edema.  Mucosa is moist without lesions. Neck: Full range of motion without pain.   There is no significant lymphadenopathy.  No masses palpable.  Thyroid bed within normal limits to palpation.  Parotid glands and submandibular glands equal bilaterally without mass.  Trachea is midline. Neuro:  CN 2-12 grossly intact.   Procedure:  Flexible Nasal Endoscopy: Description: Risks, benefits, and alternatives of flexible endoscopy were explained to the patient.  Specific mention was made of the risk of throat numbness with difficulty swallowing, possible bleeding from the nose and mouth, and pain from the procedure.  The patient gave oral consent to proceed.  The flexible scope was inserted into the right nasal cavity.  Endoscopy of the interior nasal cavity, superior, inferior, and middle meatus was performed. The sphenoid-ethmoid recess was examined. Edematous mucosa was noted.  No polyp, mass, or lesion was appreciated. Olfactory cleft was clear.  Nasopharynx was clear.  Turbinates were hypertrophied but without mass.  The procedure was repeated on the contralateral side with similar findings.  The patient tolerated the procedure well.   Assessment: 1.  Chronic rhinitis with nasal mucosal congestion and bilateral inferior turbinate hypertrophy. 2.  No polyps, mass, lesion, or purulent drainage is noted today.  Plan: 1.  The physical exam and nasal endoscopy findings are reviewed with the patient. 2.  The patient is reassured that no infection is noted today. 3.  Flonase nasal spray 2 sprays each nostril daily.  The importance of consistent daily use is discussed. 4.  The patient will return for reevaluation in 6 weeks.  If she continues to be symptomatic, she may benefit from surgical intervention with bilateral turbinate reduction.  Chidera Dearcos W Megahn Killings 10/09/2023, 6:16 PM

## 2023-10-11 ENCOUNTER — Telehealth: Payer: Self-pay | Admitting: *Deleted

## 2023-10-11 DIAGNOSIS — M810 Age-related osteoporosis without current pathological fracture: Secondary | ICD-10-CM

## 2023-10-11 MED ORDER — DENOSUMAB 60 MG/ML ~~LOC~~ SOSY
60.0000 mg | PREFILLED_SYRINGE | Freq: Once | SUBCUTANEOUS | Status: AC
Start: 2023-10-16 — End: 2023-11-06
  Administered 2023-11-06: 60 mg via SUBCUTANEOUS

## 2023-10-11 NOTE — Telephone Encounter (Signed)
Last prolia given 04/03/23 Pt was due 10/04/23.

## 2023-10-11 NOTE — Telephone Encounter (Signed)
CAM placed by SR- will wait on EOV.

## 2023-10-16 ENCOUNTER — Telehealth: Payer: Self-pay

## 2023-10-16 NOTE — Telephone Encounter (Signed)
Prolia VOB initiated via [email or web address] 

## 2023-10-16 NOTE — Telephone Encounter (Signed)
Prolia BIV in separate encounter. Will route encounter back once benefit verification is complete.

## 2023-10-18 NOTE — Telephone Encounter (Signed)
Pt ready for scheduling for PROLIA on or after : 10/18/23  Out-of-pocket cost due at time of visit: $0  Number of injection/visits approved: ---  Primary: MEDICARE Prolia co-insurance: 0% Admin fee co-insurance: 0%  Secondary: [place] Prolia co-insurance:  Admin fee co-insurance:   Medical Benefit Details: Date Benefits were checked: 10/16/23 Deductible: $240 MET OF $240 REQUIRED/ Coinsurance: 0%/ Admin Fee: 0%  Prior Auth: N/A PA# Expiration Date:   # of doses approved:  Pharmacy benefit: Copay $--- If patient wants fill through the pharmacy benefit please send prescription to:  --- , and include estimated need by date in rx notes. Pharmacy will ship medication directly to the office.  Patient NOT eligible for Prolia Copay Card. Copay Card can make patient's cost as little as $25. Link to apply: [email or web address]  ** This summary of benefits is an estimation of the patient's out-of-pocket cost. Exact cost may very based on individual plan coverage.

## 2023-10-21 NOTE — Telephone Encounter (Signed)
Pt is scheduled for 11/06/23.  When does her authorization expires?

## 2023-11-06 ENCOUNTER — Telehealth: Payer: Self-pay | Admitting: Emergency Medicine

## 2023-11-06 ENCOUNTER — Ambulatory Visit: Payer: Medicare Other | Admitting: Emergency Medicine

## 2023-11-06 ENCOUNTER — Other Ambulatory Visit: Payer: Self-pay

## 2023-11-06 DIAGNOSIS — M810 Age-related osteoporosis without current pathological fracture: Secondary | ICD-10-CM | POA: Diagnosis not present

## 2023-11-06 MED ORDER — DENOSUMAB 60 MG/ML ~~LOC~~ SOSY
60.0000 mg | PREFILLED_SYRINGE | Freq: Once | SUBCUTANEOUS | Status: AC
Start: 2024-04-05 — End: 2024-05-21
  Administered 2024-05-21: 60 mg via SUBCUTANEOUS

## 2023-11-06 NOTE — Telephone Encounter (Signed)
 Pt ready for scheduling for PROLIA  on or after : 11/06/23  Out-of-pocket cost due at time of visit: $0  Number of injection/visits approved: ---  Primary: MEDICARE Prolia  co-insurance: 0% Admin fee co-insurance: 0%  Secondary: [place] Prolia  co-insurance: covers the Medicare Part B deductible, co-insurance and 100% of the excess charges.  Admin fee co-insurance:   Medical Benefit Details: Date Benefits were checked: 11/06/23 Deductible: $0 Met of $257 Required/ Coinsurance: 0%/ Admin Fee: 0%  Prior Auth: N/A PA# Expiration Date:   # of doses approved:  Pharmacy benefit: Copay $--- If patient wants fill through the pharmacy benefit please send prescription to:  --- , and include estimated need by date in rx notes. Pharmacy will ship medication directly to the office.  Patient NOT eligible for Prolia  Copay Card. Copay Card can make patient's cost as little as $25. Link to apply: [email or web address]://[email or web address]  ** This summary of benefits is an estimation of the patient's out-of-pocket cost. Exact cost may very based on individual plan coverage.

## 2023-11-06 NOTE — Telephone Encounter (Signed)
 Marland Kitchen

## 2023-11-06 NOTE — Progress Notes (Signed)
 Patient here for prolia injection per physicians orders.  Injection given left subq and patient tolerated well.

## 2023-11-06 NOTE — Telephone Encounter (Signed)
 Prolia was done 11/06/23 Next is due on/after: 05/05/24 CAM placed.

## 2023-11-06 NOTE — Telephone Encounter (Signed)
 MRN: [id number]   This patient was seen today for Prolia injection

## 2023-11-06 NOTE — Telephone Encounter (Addendum)
 Prolia VOB initiated via [email or web address]

## 2023-11-11 NOTE — Telephone Encounter (Signed)
 Patient had prolia on 11/06/23

## 2023-11-20 ENCOUNTER — Ambulatory Visit (INDEPENDENT_AMBULATORY_CARE_PROVIDER_SITE_OTHER): Payer: Medicare Other

## 2023-11-22 ENCOUNTER — Encounter (HOSPITAL_BASED_OUTPATIENT_CLINIC_OR_DEPARTMENT_OTHER): Payer: Self-pay

## 2023-11-23 ENCOUNTER — Other Ambulatory Visit: Payer: Self-pay | Admitting: Cardiovascular Disease

## 2023-11-28 ENCOUNTER — Ambulatory Visit (INDEPENDENT_AMBULATORY_CARE_PROVIDER_SITE_OTHER): Payer: Medicare Other | Admitting: Cardiovascular Disease

## 2023-11-28 ENCOUNTER — Encounter (HOSPITAL_BASED_OUTPATIENT_CLINIC_OR_DEPARTMENT_OTHER): Payer: Self-pay | Admitting: Cardiovascular Disease

## 2023-11-28 VITALS — BP 126/84 | HR 64 | Ht 66.0 in | Wt 164.4 lb

## 2023-11-28 DIAGNOSIS — E7800 Pure hypercholesterolemia, unspecified: Secondary | ICD-10-CM

## 2023-11-28 DIAGNOSIS — I2510 Atherosclerotic heart disease of native coronary artery without angina pectoris: Secondary | ICD-10-CM

## 2023-11-28 DIAGNOSIS — I739 Peripheral vascular disease, unspecified: Secondary | ICD-10-CM

## 2023-11-28 DIAGNOSIS — I7121 Aneurysm of the ascending aorta, without rupture: Secondary | ICD-10-CM

## 2023-11-28 DIAGNOSIS — I10 Essential (primary) hypertension: Secondary | ICD-10-CM

## 2023-11-28 DIAGNOSIS — I6523 Occlusion and stenosis of bilateral carotid arteries: Secondary | ICD-10-CM

## 2023-11-28 DIAGNOSIS — Z952 Presence of prosthetic heart valve: Secondary | ICD-10-CM | POA: Diagnosis not present

## 2023-11-28 NOTE — Patient Instructions (Signed)
Medication Instructions:  Your physician recommends that you continue on your current medications as directed. Please refer to the Current Medication list given to you today.  *If you need a refill on your cardiac medications before your next appointment, please call your pharmacy*  Lab Work: FASTING LP/CMET TODAY   If you have labs (blood work) drawn today and your tests are completely normal, you will receive your results only by: MyChart Message (if you have MyChart) OR A paper copy in the mail If you have any lab test that is abnormal or we need to change your treatment, we will call you to review the results.  Testing/Procedures: Your physician has requested that you have an ankle brachial index (ABI). During this test an ultrasound and blood pressure cuff are used to evaluate the arteries that supply the arms and legs with blood. Allow thirty minutes for this exam. There are no restrictions or special instructions.  Please note: We ask at that you not bring children with you during ultrasound (echo/ vascular) testing. Due to room size and safety concerns, children are not allowed in the ultrasound rooms during exams. Our front office staff cannot provide observation of children in our lobby area while testing is being conducted. An adult accompanying a patient to their appointment will only be allowed in the ultrasound room at the discretion of the ultrasound technician under special circumstances. We apologize for any inconvenience.  Your physician has requested that you have a lower extremity arterial duplex. This test is an ultrasound of the arteries in the legs or arms. It looks at arterial blood flow in the legs and arms. Allow one hour for Lower and Upper Arterial scans. There are no restrictions or special instructions.  Please note: We ask at that you not bring children with you during ultrasound (echo/ vascular) testing. Due to room size and safety concerns, children are not  allowed in the ultrasound rooms during exams. Our front office staff cannot provide observation of children in our lobby area while testing is being conducted. An adult accompanying a patient to their appointment will only be allowed in the ultrasound room at the discretion of the ultrasound technician under special circumstances. We apologize for any inconvenience.  Your physician has requested that you have an echocardiogram. Echocardiography is a painless test that uses sound waves to create images of your heart. It provides your doctor with information about the size and shape of your heart and how well your heart's chambers and valves are working. This procedure takes approximately one hour. There are no restrictions for this procedure. Please do NOT wear cologne, perfume, aftershave, or lotions (deodorant is allowed). Please arrive 15 minutes prior to your appointment time.  Please note: We ask at that you not bring children with you during ultrasound (echo/ vascular) testing. Due to room size and safety concerns, children are not allowed in the ultrasound rooms during exams. Our front office staff cannot provide observation of children in our lobby area while testing is being conducted. An adult accompanying a patient to their appointment will only be allowed in the ultrasound room at the discretion of the ultrasound technician under special circumstances. We apologize for any inconvenience. TO BE DONE IN DECEMBER   Follow-Up: At [facility], you and your health needs are our priority.  As part of our continuing mission to provide you with exceptional heart care, we have created designated Provider Care Teams.  These Care Teams include your primary Cardiologist (physician) and  Advanced Practice Providers (APPs -  Physician Assistants and Nurse Practitioners) who all work together to provide you with the care you need, when you need it.  We recommend signing up for the patient portal  called "MyChart".  Sign up information is provided on this After Visit Summary.  MyChart is used to connect with patients for Virtual Visits (Telemedicine).  Patients are able to view lab/test results, encounter notes, upcoming appointments, etc.  Non-urgent messages can be sent to your provider as well.   To learn more about what you can do with MyChart, go to [email or web address].    Your next appointment:   [facility] OR Ronn Melena NP AFTER ECHO IN [place]   Other Instructions

## 2023-11-28 NOTE — Progress Notes (Signed)
Cardiology Office Note:  .   Date:  11/28/2023  ID:  Dominique [name], DOB 06/23/1941, MRN [id number] PCP: Wanda Plump, MD  [facility] Providers Cardiologist:  Chilton Si, MD    History of Present Illness: .   Dominique [name] is a 83 y.o. female with severe aortic stenosis s/p TAVR, non-obstructive CAD, mild carotid stenosis, hyperlipidemia, SVT, moderate TR, mild pulmonary hypertension, hypertension, and severe COPD here for follow-up. Dominique [name] saw Dr. Mayford Knife on 05/2017.  She was noted to have moderate aortic stenosis (peak velocity 3.1 m/s, mean gradient 21 mmHg).  She had a chest CT 11/2016 that showed coronary calcifications.  She subsequently had a coronary CT-A 05/2017 that revealed less than 50% plaque in the proximal LAD and a mild ascending aortic aneurysm (3.9 cm).  She was noted to have calcification of the aorta and aortic valve.  Echo 05/31/17 that revealed LVEF 60-65% with normal diastolic function.  She had a repeat echo 05/2018 that showed her mean aortic valve gradient increased to 36 mmHg.  She continued to report chest discomfort so she had a repeat coronary CT-A 08/2018 that revealed 25-49% stenosis of the proximal LAD unchanged from prior.    She reported increasing exertional dyspnea and chest discomfort.  She was referred to Dr. Excell Seltzer and underwent TAVR 26 mm Medtronic CoreValve Evolut Pro on 12/2018.  She had a cardiac catheterization prior to TAVR that showed normal coronary arteries.    Postoperative echo revealed a mean gradient of 6 mmHg.  However this procedure was complicated by an ischemic left leg.  She went to the OR emergently with Dr. Arbie Cookey for repair of the left femoral artery with endarterectomy and dacryon patch angioplasty.   Dominique [name] required a laparoscopic cholecystectomy 03/2019.  Her hospitalization was complicated by atrial fibrillation.  This was thought to be due to her acute illness and she was not started on long-term anticoagulation.  She saw  Dr. Drue Novel 05/2019 and reported symptoms concerning for TIA.  She had bilateral carotid Dopplers that showed 60 to 79% right ICA stenosis and 1 to 39% stenosis on the left.  She was referred to neurology and had an MRI of the brain 06/30/2019 that showed chronic small vessel changes but no acute findings.  Repeat carotid Dopplers 02/2020 revealed mild stenosis bilaterally.  Dominique [name] had heart racing after her third COVID-19 vaccination 06/2020.  She wore a heart monitor that showed several runs of SVT.  The longest episode was 7 beats.     She saw Dominique [name], [place] 02/2021 and was taking lasix intermittently for edema. Her blood pressure was elevated so amlodipine was reduced and she was started on HCTZ. She followed up with Dominique Fabian, NP and her symptoms had improved.  At her appointment 10/2022 blood pressure was elevated in the office but she reported it was well-controlled at home.  She had recently been started on supplemental oxygen.  She saw Dominique Shields, NP 11/2022 and was excited that she was no longer on oxygen.  They noted that her home blood pressure cuff reads 10 points higher than the office cuff.  Her home blood pressures have been running in the 100s to 130s over 50s to 60s.  She was seen 04/2023 with new onset fatigue and dyspnea.  Her oxygen levels dropped to 87% with ambulation.  Recommended that she start back using supplemental oxygen and follow-up with pulmonology.  Check carotid Dopplers 05/2023 that revealed mild stenosis bilaterally.  She  was also found to have multiple thyroid nodules.  She also has multiple lung nodules.  She follows with pulmonology for this.  Given the location she elected for serial surveillance rather than biopsy.   [name] notes that since starting nighttime oxygen therapy, breathing has improved, and nasal congestion has resolved with nasal drops. Oxygen saturation is 95% during the day.  She experiences occasional palpitations that occur infrequently and  do not last long. Previously, there were frequent palpitations over a two to three week period, but this has resolved. She is currently on amlodipine, hydrochlorothiazide, and metoprolol, with instructions to take an extra dose of metoprolol if palpitations occur frequently. No dizziness, lightheadedness, chest pain, or pressure is reported.  She describes her feet and hands as 'always ice cold' and has a history of needing urgent surgery following a heart procedure, with circulation not returning to normal since then. No pain in the legs when walking is noted, and there is no swelling in the legs or feet.  Blood pressure at home is typically around 132/72 mmHg, and she has not experienced any swelling in the legs or feet.  She has not been engaging in regular exercise since starting oxygen therapy, despite having equipment at home, including a resistance machine that remains unpacked. She lacks interest in exercise but acknowledges its importance.     ROS:  As per HPI  Studies Reviewed: .       Carotid Doppler 05/2023:   Summary:  Right Carotid: Velocities in the right ICA are consistent with a 1-39%  stenosis.   Left Carotid: Velocities in the left ICA are consistent with a 1-39%  stenosis.   Vertebrals: Bilateral vertebral arteries demonstrate antegrade flow.  Subclavians: Normal flow hemodynamics were seen in bilateral subclavian               arteries.                 Incidental finding: Bilateral multiple heterogenous thyroid               nodules. Largest on right measures 1.4 x 1.2 x 1.3 cm,  periphery              calcifications noted. Largest on left measures 1.9 x 1.5 x  1.3 cm.               Consider dedicated thyroid ultrasound.   Risk Assessment/Calculations:             Physical Exam:   VS:  BP 126/84   Pulse 64   Ht 5\' 6"  (1.676 m)   Wt 164 lb 6.4 oz (74.6 kg)   SpO2 95%   BMI 26.53 kg/m  , BMI Body mass index is 26.53 kg/m. GENERAL:  Well  appearing HEENT: Pupils equal round and reactive, fundi not visualized, oral mucosa unremarkable NECK:  No jugular venous distention, waveform within normal limits, carotid upstroke brisk and symmetric, no bruits, no thyromegaly LUNGS:  Clear to auscultation bilaterally HEART:  RRR.  PMI not displaced or sustained,S1 and S2 within normal limits, no S3, no S4, no clicks, no rubs, no murmurs ABD:  Flat, positive bowel sounds normal in frequency in pitch, no bruits, no rebound, no guarding, no midline pulsatile mass, no hepatomegaly, no splenomegaly EXT:  1+ DP/TP pulses. No edema, no cyanosis no clubbing SKIN:  No rashes no nodules NEURO:  Cranial nerves II through XII grossly intact, motor grossly intact throughout PSYCH:  Cognitively intact, oriented  to person place and time   ASSESSMENT AND PLAN: .    # s/p TAVR:  Last echocardiogram in December of last year showed good valve function. -Plan to repeat echocardiogram in December this year.   # Chronic Hypoxemia Improved with nocturnal oxygen therapy. No daytime hypoxemia. -Continue current oxygen therapy regimen.  # Palpitations Infrequent, brief episodes without associated dizziness or lightheadedness. -If frequency increases, consider increasing Metoprolol dose.  # Peripheral Vascular Disease Cold feet, more pronounced on the left. She denies claudication but isn't very active.  I don't feel great peripheral pulses.   -Order Ankle-Brachial Index and arterial Dopplers to assess peripheral circulation.  # Hypertension Well-controlled on current regimen of Amlodipine, Hydrochlorothiazide, and Metoprolol. -Continue current antihypertensive regimen.  # Non-obstructive CAD:  # Hyperlipidemia Due for cholesterol check. -Order lipid panel and comprehensive metabolic panel today.   # Mild carotid stenosis:  Mild on Dopplers 05/2023.  Lipid control as above.   -Continue aspirin and rosuvastatin.  General Health  Maintenance -Encourage regular, moderate exercise at home. -Continue current medications.            Dispo: f/u 09/2024  Signed, Chilton Si, MD

## 2023-11-29 LAB — LIPID PANEL
Chol/HDL Ratio: 2 {ratio} (ref 0.0–4.4)
Cholesterol, Total: 161 mg/dL (ref 100–199)
HDL: 79 mg/dL (ref >39)
LDL Chol Calc (NIH): 70 mg/dL (ref 0–99)
Triglycerides: 59 mg/dL (ref 0–149)
VLDL Cholesterol Cal: 12 mg/dL (ref 5–40)

## 2023-11-29 LAB — COMPREHENSIVE METABOLIC PANEL
ALT: 23 [IU]/L (ref 0–32)
AST: 29 [IU]/L (ref 0–40)
Albumin: 4.1 g/dL (ref 3.7–4.7)
Alkaline Phosphatase: 51 [IU]/L (ref 44–121)
BUN/Creatinine Ratio: 28 (ref 12–28)
BUN: 25 mg/dL (ref 8–27)
Bilirubin Total: 0.5 mg/dL (ref 0.0–1.2)
CO2: 27 mmol/L (ref 20–29)
Calcium: 10.3 mg/dL (ref 8.7–10.3)
Chloride: 102 mmol/L (ref 96–106)
Creatinine, Ser: 0.88 mg/dL (ref 0.57–1.00)
Globulin, Total: 2.7 g/dL (ref 1.5–4.5)
Glucose: 93 mg/dL (ref 70–99)
Potassium: 4.5 mmol/L (ref 3.5–5.2)
Sodium: 145 mmol/L — ABNORMAL HIGH (ref 134–144)
Total Protein: 6.8 g/dL (ref 6.0–8.5)
eGFR: 65 mL/min/{1.73_m2} (ref >59)

## 2023-12-09 ENCOUNTER — Encounter: Payer: Self-pay | Admitting: Internal Medicine

## 2023-12-09 ENCOUNTER — Ambulatory Visit (INDEPENDENT_AMBULATORY_CARE_PROVIDER_SITE_OTHER): Payer: Medicare Other | Admitting: Internal Medicine

## 2023-12-09 VITALS — BP 126/66 | HR 56 | Temp 98.0°F | Resp 18 | Ht 66.0 in | Wt 165.4 lb

## 2023-12-09 DIAGNOSIS — I10 Essential (primary) hypertension: Secondary | ICD-10-CM

## 2023-12-09 DIAGNOSIS — M810 Age-related osteoporosis without current pathological fracture: Secondary | ICD-10-CM

## 2023-12-09 DIAGNOSIS — E7800 Pure hypercholesterolemia, unspecified: Secondary | ICD-10-CM | POA: Diagnosis not present

## 2023-12-09 DIAGNOSIS — R918 Other nonspecific abnormal finding of lung field: Secondary | ICD-10-CM | POA: Diagnosis not present

## 2023-12-09 NOTE — Progress Notes (Signed)
 RTC 4 months   Subjective:    Patient ID: Dominique [name], female    DOB: 28-Oct-1941, 83 y.o.   MRN: [id number]  DOS:  12/09/2023 Type of visit - description: Routine checkup  Here for routine checkup, chronic medical problems addressed. In general feeling very well, on oxygen  at nighttime, at the last visit she complained of fatigue, that is better.  Review of Systems See above   Past Medical History:  Diagnosis Date   Allergy     Atrial fibrillation (HCC)    a. dx 03/2019 while in hospital with cholecystitis   COPD (chronic obstructive pulmonary disease) (HCC)    Critical lower limb ischemia (HCC)    a. after TAVR developed ischemic L leg likely due to flap/closure, s/p emergent repair of left femoral artery with endarterectomy and Dacron patch angioplasty.   Emphysema lung (HCC)    Emphysema of lung (HCC)    GERD (gastroesophageal reflux disease)    Heart murmur    Hyperlipidemia    LDL goal < 70   Hypertension    Hypomagnesemia    Normal coronary arteries 10/2018   NSVT (nonsustained ventricular tachycardia) (HCC) 03/02/2019   Osteoporosis    Oxygen  deficiency    Pulmonary HTN (HCC) 06/05/2016   Moderate with PASP by echo 04/2016 likely Group 3 from COPD and possibly Group 2 from pulmonary venous HTN associated with moderate AS - f/u echo 12/2018 post TAVR showed normal RVSP   Pulmonary nodules    seen on pre TAVR CT, needs 12 month CT follow up   S/P TAVR (transcatheter aortic valve replacement) 12/30/2018   Medtronic Evolut Pro-Plus THV (size 26 mm, serial # [id number]) via the TF approach   Sacral fracture (HCC)    Severe aortic stenosis    a. s/p TAVR 12/2018.   Thoracic ascending aortic aneurysm (HCC)    needs yearly follow up   Thyroid  nodule 11/08/2022    Past Surgical History:  Procedure Laterality Date   APPENDECTOMY  1951   CARDIAC VALVE REPLACEMENT  01/26/19   CATARACT EXTRACTION     CESAREAN SECTION     1971   CHOLECYSTECTOMY N/A 04/28/2019    Procedure: LAPAROSCOPIC CHOLECYSTECTOMY;  Surgeon: Lockie Rima, MD;  Location: [facility] ORS;  Service: General;  Laterality: N/A;   COLONOSCOPY  2018   EYE SURGERY     FEMORAL-POPLITEAL BYPASS GRAFT Left 12/31/2018   Procedure: PATCH ANGIOPLASTY REPAIR LEFT FEMORAL ARTERY USING HEMASHIELD PLATINUM FINESSE PATCH;  Surgeon: Mayo Speck, MD;  Location: [facility] OR;  Service: Vascular;  Laterality: Left;   IR RADIOLOGY PERIPHERAL GUIDED IV START  05/30/2017   IR RADIOLOGY PERIPHERAL GUIDED IV START  09/02/2018   IR RADIOLOGY PERIPHERAL GUIDED IV START  09/02/2018   IR US  GUIDE VASC ACCESS LEFT  09/02/2018   IR US  GUIDE VASC ACCESS RIGHT  05/30/2017   IR US  GUIDE VASC ACCESS RIGHT  09/02/2018   JOINT REPLACEMENT  2016   sacroplasty   LAMINECTOMY     RIGHT/LEFT HEART CATH AND CORONARY ANGIOGRAPHY N/A 11/27/2018   Procedure: RIGHT/LEFT HEART CATH AND CORONARY ANGIOGRAPHY;  Surgeon: Arnoldo Lapping, MD;  Location: [facility] INVASIVE CV LAB;  Service: Cardiovascular;  Laterality: N/A;   TEE WITHOUT CARDIOVERSION N/A 12/30/2018   Procedure: TRANSESOPHAGEAL ECHOCARDIOGRAM (TEE);  Surgeon: Arnoldo Lapping, MD;  Location: [facility] INVASIVE CV LAB;  Service: Open Heart Surgery;  Laterality: N/A;   TOTAL HIP ARTHROPLASTY Bilateral    TRANSCATHETER AORTIC VALVE REPLACEMENT, TRANSFEMORAL N/A 12/30/2018  Procedure: TRANSCATHETER AORTIC VALVE REPLACEMENT, TRANSFEMORAL;  Surgeon: Arnoldo Lapping, MD;  Location: [facility] INVASIVE CV LAB;  Service: Open Heart Surgery;  Laterality: N/A;   TUBAL LIGATION  1975    Current Outpatient Medications  Medication Instructions   acetaminophen  (TYLENOL ) 650 mg, Oral, Every 6 hours PRN   albuterol  (VENTOLIN  HFA) 108 (90 Base) MCG/ACT inhaler 2 puffs, Inhalation, Every 6 hours PRN   amLODipine  (NORVASC ) 5 mg, Oral, Daily   amoxicillin  (AMOXIL ) 500 MG tablet Take 4 tablets (2,000mg ) one hour prior to all dental visits.   aspirin  EC 81 mg, Daily   Budeson-Glycopyrrol-Formoterol  (BREZTRI  AEROSPHERE)  160-9-4.8 MCG/ACT AERO 2 puffs, Inhalation, 2 times daily   Calcium  Carb-Cholecalciferol (CALCIUM  600 + D PO) 600 mg, 3 times weekly   denosumab  (PROLIA ) 60 mg, Every 6 months   EPINEPHrine  (EPI-PEN) 0.3 mg, As needed   furosemide  (LASIX ) 20 MG tablet DAILY AS NEEDED FOR SWELLING   hydrochlorothiazide  (MICROZIDE ) 12.5 MG capsule TAKE 1 CAPSULE BY MOUTH EVERY DAY   loratadine  (CLARITIN ) 10 mg, Daily PRN   magnesium  oxide (MAG-OX) 400 mg, Oral, Daily   metoprolol  succinate (TOPROL -XL) 25 mg, Oral, Daily   Multiple Vitamin (MULTIVITAMIN WITH MINERALS) TABS tablet 1 tablet, Daily   OXYGEN  2 L, Continuous   rosuvastatin  (CRESTOR ) 40 mg, Oral, Daily   Vitamin D3 5,000 Units, Daily       Objective:   Physical Exam BP 126/66   Pulse (!) 56   Temp 98 F (36.7 C) (Oral)   Resp 18   Ht 5\' 6"  (1.676 m)   Wt 165 lb 6 oz (75 kg)   SpO2 90% Comment: Room air  BMI 26.69 kg/m  General:   Well developed, NAD, BMI noted. HEENT:  Normocephalic . Face symmetric, atraumatic Lungs:  CTA B Normal respiratory effort, no intercostal retractions, no accessory muscle use. Heart: RRR, systolic murmur.  Lower extremities: no pretibial edema bilaterally  Skin: Not pale. Not jaundice Neurologic:  alert & oriented X3.  Speech normal, gait appropriate for age and unassisted Psych--  Cognition and judgment appear intact.  Cooperative with normal attention span and concentration.  Behavior appropriate. No anxious or depressed appearing.      Assessment     Assessment Hyperglycemia HTN Hyperlipidemia PULM: -Emphysema, quit tobacco 2006 --Nocturnal O2 as needed -CT chest angio  05-2016 ----pulm nodule x2,reticular infiltrate ; f/u CT 11/2016 stable nodule, no f/u ----enlarged R P.A. and Ao -Abnormal PET scan 12-2019 CV: -DOE: (-) myoview   8-017, CT angio chest 05-2016:  no PE  -S/p TVAR 12/2018 -Paroxysmal A. fib DX 6/28/ 2020 in the context of acute cholecystitis, converted to NSR, not  anticoagulated as of 05/25/2019 --Nonobstructive CAD (per coronary CTA) -Aspirin  indefinitely, SBE prophylaxis with amoxicillin  -MSK --DJD --Sacral Fx, sacral insufficiency, s/p sacroplasty (radiology) 07-2015 --H/o osteoporosis :  took fosamax while in [place] , Fosamax rx again by ortho after sacral Fx 07-2015, never took it, first Prolia  11-14-2015 --T score 08-2015 >> normal (likely normal d/t DJD?) H/o vit D def: Normal level 09-2015  Thyroid  nodules- per Dr Ronelle Coffee , s/p bx 01/2023      PLAN  HTN: BP is very good, last BMP okay, continue amlodipine , HCTZ, metoprolol .  On Lasix  as needed only CV, s/p TEVAR: Saw cardiology 10-2023.  They ordered an echo and ABIs.  Pending. High cholesterol: Last LDL 70, continue rosuvastatin . Osteoporosis: Had a Prolia  11/06/2023 COPD, chronic hypoxic respiratory failure: Overall feels well, previously complained of fatigue during the daytime  that is better.  O2 sat daytime at home around 95%.  Uses nocturnal oxygen . Pulmonary nodules: Concerning for indolent primary lung cancer, saw pulmonary October 2024.  They are planning a follow-up CT April 2025. Preventive care reviewed: - Td 2017 -pnm shot 2012; prevnar 2015; pnm 23: 2023  -s/p  shingrex x2   - had a flu shot and covid vax few months ago  - rec RSV  -Female care:   pt reports has a MMG q year -CCS:  See previous entries, patient does not desire another colonoscopy which is within guidelines. -Healthcare POA documents in the chart RTC 4 to 5 months

## 2023-12-09 NOTE — Assessment & Plan Note (Signed)
 HTN: BP is very good, last BMP okay, continue amlodipine , HCTZ, metoprolol .  On Lasix  as needed only CV, s/p TEVAR: Saw cardiology 10-2023.  They ordered an echo and ABIs.  Pending. High cholesterol: Last LDL 70, continue rosuvastatin . Osteoporosis: Had a Prolia  11/06/2023 COPD, chronic hypoxic respiratory failure: Overall feels well, previously complained of fatigue during the daytime that is better.  O2 sat daytime at home around 95%.  Uses nocturnal oxygen . Pulmonary nodules: Concerning for indolent primary lung cancer, saw pulmonary October 2024.  They are planning a follow-up CT April 2025. Preventive care reviewed  RTC 4 to 5 months

## 2023-12-09 NOTE — Assessment & Plan Note (Signed)
  Preventive care reviewed: - Td 2017 -pnm shot 2012; prevnar 2015; pnm 23: 2023  -s/p  shingrex x2   - had a flu shot and covid vax few months ago  - rec RSV  -Female care:   pt reports has a MMG q year -CCS:  See previous entries, patient does not desire another colonoscopy which is within guidelines. -Healthcare POA documents in the chart

## 2023-12-09 NOTE — Patient Instructions (Addendum)
 Vaccines I recommend: RSV  Check the  blood pressure regularly Blood pressure goal:  between 110/65 and  135/85. If it is consistently higher or lower, let me know  Continue checking your oxygen        Next visit with me in 4 to 5 months     Please schedule it at the front desk

## 2023-12-18 ENCOUNTER — Encounter (HOSPITAL_BASED_OUTPATIENT_CLINIC_OR_DEPARTMENT_OTHER): Payer: Self-pay

## 2023-12-19 ENCOUNTER — Other Ambulatory Visit: Payer: Self-pay | Admitting: Cardiovascular Disease

## 2023-12-23 ENCOUNTER — Ambulatory Visit (HOSPITAL_BASED_OUTPATIENT_CLINIC_OR_DEPARTMENT_OTHER): Payer: Medicare Other

## 2023-12-23 DIAGNOSIS — I10 Essential (primary) hypertension: Secondary | ICD-10-CM

## 2023-12-23 DIAGNOSIS — I739 Peripheral vascular disease, unspecified: Secondary | ICD-10-CM

## 2023-12-23 LAB — VAS US ABI WITH/WO TBI
Left ABI: 0.99
Right ABI: 1.12

## 2024-01-02 ENCOUNTER — Encounter (HOSPITAL_BASED_OUTPATIENT_CLINIC_OR_DEPARTMENT_OTHER): Payer: Self-pay | Admitting: Cardiovascular Disease

## 2024-01-02 ENCOUNTER — Encounter (HOSPITAL_BASED_OUTPATIENT_CLINIC_OR_DEPARTMENT_OTHER): Payer: Self-pay | Admitting: *Deleted

## 2024-01-10 ENCOUNTER — Other Ambulatory Visit: Payer: Self-pay | Admitting: Internal Medicine

## 2024-01-10 ENCOUNTER — Other Ambulatory Visit (INDEPENDENT_AMBULATORY_CARE_PROVIDER_SITE_OTHER): Payer: Self-pay | Admitting: Physician Assistant

## 2024-01-10 ENCOUNTER — Other Ambulatory Visit: Payer: Self-pay | Admitting: Pulmonary Disease

## 2024-01-10 DIAGNOSIS — J449 Chronic obstructive pulmonary disease, unspecified: Secondary | ICD-10-CM

## 2024-01-20 ENCOUNTER — Other Ambulatory Visit: Payer: Self-pay | Admitting: Endocrinology

## 2024-01-20 DIAGNOSIS — I10 Essential (primary) hypertension: Secondary | ICD-10-CM | POA: Diagnosis not present

## 2024-01-20 DIAGNOSIS — E042 Nontoxic multinodular goiter: Secondary | ICD-10-CM | POA: Diagnosis not present

## 2024-01-20 DIAGNOSIS — E7800 Pure hypercholesterolemia, unspecified: Secondary | ICD-10-CM | POA: Diagnosis not present

## 2024-01-20 DIAGNOSIS — M810 Age-related osteoporosis without current pathological fracture: Secondary | ICD-10-CM | POA: Diagnosis not present

## 2024-01-28 ENCOUNTER — Ambulatory Visit
Admission: RE | Admit: 2024-01-28 | Discharge: 2024-01-28 | Disposition: A | Discharge status: Home / Self Care | Source: Ambulatory Visit | Attending: Endocrinology | Admitting: Endocrinology

## 2024-01-28 DIAGNOSIS — E042 Nontoxic multinodular goiter: Secondary | ICD-10-CM

## 2024-02-25 ENCOUNTER — Ambulatory Visit (HOSPITAL_BASED_OUTPATIENT_CLINIC_OR_DEPARTMENT_OTHER)
Admission: RE | Admit: 2024-02-25 | Discharge: 2024-02-25 | Disposition: A | Discharge status: Home / Self Care | Payer: Medicare Other | Source: Ambulatory Visit | Attending: Acute Care | Admitting: Acute Care

## 2024-02-25 DIAGNOSIS — R918 Other nonspecific abnormal finding of lung field: Secondary | ICD-10-CM | POA: Diagnosis not present

## 2024-03-03 ENCOUNTER — Ambulatory Visit: Admitting: Acute Care

## 2024-03-03 ENCOUNTER — Encounter: Payer: Self-pay | Admitting: Acute Care

## 2024-03-03 ENCOUNTER — Telehealth: Payer: Self-pay | Admitting: Acute Care

## 2024-03-03 VITALS — BP 140/62 | HR 77 | Ht 66.0 in | Wt 164.4 lb

## 2024-03-03 DIAGNOSIS — Z87891 Personal history of nicotine dependence: Secondary | ICD-10-CM

## 2024-03-03 DIAGNOSIS — R911 Solitary pulmonary nodule: Secondary | ICD-10-CM

## 2024-03-03 DIAGNOSIS — J3089 Other allergic rhinitis: Secondary | ICD-10-CM

## 2024-03-03 MED ORDER — BREZTRI AEROSPHERE 160-9-4.8 MCG/ACT IN AERO: 2.0000 | INHALATION_SPRAY | Freq: Two times a day (BID) | RESPIRATORY_TRACT | Status: DC

## 2024-03-03 NOTE — Patient Instructions (Addendum)
 It is good to see you today. Please try changing your claritin  to one of the other non-sedating antihistamines like Zyrtec, Xyzol, or Allegra. Generic is ok. Take at bedtime to prevent sleepiness.  Continue Flonase as you have been doing.  Your CT Scan shows stable lung nodules. We discussed biopsy to determine if these are a slow growing adenocarcinoma vs continued surveillance with Ct Chest in 6 months. You prefer to do the CT Scan surveillance at this time.  I have ordered a 6 month CT Chest which will be due end of October/ Beginning of November 2025. Follow up with me after to review results. If you develop any blood  in your sputum or have weight loss you cannot explain, please call to be seen sooner.  We will give you 2 samples of Breztri , and see if you qualify for financial assistance through the manufacturer.  We will give you the paperwork. Any issues with breathing , call to be seen sooner. Have a great summer. Please contact office for sooner follow up if symptoms do not improve or worsen or seek emergency care

## 2024-03-03 NOTE — Progress Notes (Signed)
 History of Present Illness Dominique [name] is a 83 y.o. female former smoker quit 2005 with a 60 pack year smoking history, referred 01/2020 for abnormal CT imaging by Dr. Neomi Banks. She is followed by Dr. Thelda Finney.   Synopsis 83 year-old female with a past medical history of COPD, atrial fibrillation, TAVR placement by Dr. Arlester Ladd last year, hypertension moderate pulmonary hypertension. Patient had CT imaging of the chest which revealed multiple bilateral lung nodules. Some of which had groundglass areas others had in the left upper lobe adjacent to the pleura more solid component.She was referred to Dr. Thelda Finney in 2021 for  evaluation of this finding. Patient underwent PET scan imaging which revealed left upper lobe nodule with an SUV of 10. Other groundglass areas had low-level PET uptake. There was some concern for potential infectious etiology. Therefore patient was referred to pulmonary for recommendations and management . Patient's case was discussed for possible tissue sampling but it was felt to be too difficult to reach through navigational bronchoscopy. Plan has been for serial Ct chests to monitor for growth.    03/03/2024 Pt. Presents for  follow up after after surveillance Ct Chest to monitor pulmonary nodules. Scan was done 02/25/2024 and read 02/29/2024. This shows stable lung nodules , yet still concerning for a slow growing adenocarcinoma. Pt. Is 82 almost 83, she prefers to continue to monitor with these with imaging vs biopsy for now. We will do a 6 month follow up scan.  She endorses having some issues with seasonal allergies.  We discussed changing her nonsedating antihistamine from Claritin  to one of the other options, as well as using Flonase. She does like Breztri , but finds the cost very high. We will have her complete paperwork for financial assistance   Test Results: CT Chest 02/25/2024  2.9 x 1.1 cm subsolid irregular spiculated nodule in the posterior left upper lobe, not notably  changed from last year's studies but has slightly increased in size since 2020 consistent with a slow growing adenocarcinoma. Stable 6 mm ground-glass right middle lobe nodule, new from 2020. Stable ground-glass nodules in the right upper lobe. Adenocarcinoma not excluded. Stable 3 mm solid right middle lobe nodule. Aortic and coronary artery atherosclerosis. Stable dilatation of the left and right main pulmonary arteries. Small hiatal hernia. Osteopenia and degenerative change.     Latest Ref Rng & Units 08/06/2023   11:00 AM 10/17/2022   11:13 AM 03/29/2022   11:04 AM  CBC  WBC 4.0 - 10.5 K/uL 7.9  8.7  6.9   Hemoglobin 12.0 - 15.0 g/dL 16.1  09.6  04.5   Hematocrit 36.0 - 46.0 % 40.8  42.3  42.1   Platelets 150.0 - 400.0 K/uL 248.0  299.0  239.0        Latest Ref Rng & Units 11/28/2023    9:04 AM 08/06/2023   11:00 AM 03/06/2023   11:13 AM  BMP  Glucose 70 - 99 mg/dL 93  91  94   BUN 8 - 27 mg/dL 25  20  17    Creatinine 0.57 - 1.00 mg/dL 4.09  8.11  9.14   BUN/Creat Ratio 12 - 28 28     Sodium 134 - 144 mmol/L 145  140  140   Potassium 3.5 - 5.2 mmol/L 4.5  3.8  3.7   Chloride 96 - 106 mmol/L 102  100  100   CO2 20 - 29 mmol/L 27  31  29    Calcium  8.7 - 10.3 mg/dL  10.3  9.4  10.0     BNP    Component Value Date/Time   BNP 67.0 09/02/2020 1237   BNP 65.3 12/26/2018 1015    ProBNP No results found for: "PROBNP"  PFT    Component Value Date/Time   FEV1PRE 1.06 12/09/2018 1404   FEV1POST 1.07 12/09/2018 1404   FVCPRE 1.77 12/09/2018 1404   FVCPOST 2.17 12/09/2018 1404   TLC 5.43 12/09/2018 1404   DLCOUNC 8.82 12/09/2018 1404   PREFEV1FVCRT 60 12/09/2018 1404   PSTFEV1FVCRT 49 12/09/2018 1404    CT CHEST WO CONTRAST Result Date: 02/29/2024 CLINICAL DATA:  Follow-up for lung nodules.  COPD.  Emphysema. EXAM: CT CHEST WITHOUT CONTRAST TECHNIQUE: Multidetector CT imaging of the chest was performed following the standard protocol without IV contrast. RADIATION DOSE  REDUCTION: This exam was performed according to the departmental dose-optimization program which includes automated exposure control, adjustment of the mA and/or kV according to patient size and/or use of iterative reconstruction technique. COMPARISON:  Chest CTs without contrast 08/21/2023, 05/15/2023, and 02/12/2023. FINDINGS: Cardiovascular: The cardiac size is normal. A TAVR is again seen. No pericardial effusion. There is patchy single-vessel calcification in the LAD coronary artery. There is atherosclerosis in the aorta and great vessels. There is no aortic aneurysm. Marked dilatation in the left and right main pulmonary arteries up to 3.5 cm is again noted in keeping with arterial hypertension. Mediastinum/Nodes: Scattered hypodense thyroid  nodules, none of which meet size criteria for ultrasound follow-up. No interval change in the thyroid  is seen. Axillary spaces are clear with no enlarged intrathoracic lymph nodes. The the thoracic esophagus is unremarkable. There is a small hiatal hernia. Thoracic trachea is clear. Lungs/Pleura: Chronic emphysematous and scarring changes, with centrilobular changes predominating. There is chronic stable apical pleuroparenchymal scarring. No pleural effusion. Again noted is a subsolid irregular spiculated nodule distorting and abutting the adjacent fissure in the posterior aspect of the left upper lobe. This continues to measure 2.9 x 1.1 cm on 3:49 and 50, not notably changed from last year's studies but has slightly increased in size since the studies from 2020 consistent with a slow growing adenocarcinoma. The 7 mm solid component to this on 3:48 is also unchanged since last year but was not seen in 2020. There is a stable 6 mm ground-glass right middle lobe nodule on 3:95, new from 2020, stable ground-glass 1.7 x 1.4 cm nodule in the posterior right upper lobe on 4:59, more superiorly in the right upper lobe a stable 1.3 x 1.3 cm ground-glass nodule 3:44. There is a  stable 3 mm solid right middle lobe nodule on 3:120. There is no consolidation, effusion or further nodules. Upper Abdomen: Bosniak 1 cysts both upper renal poles. No acute abnormality. Musculoskeletal: Osteopenia, degenerative disc disease lower thoracic spine. No acute or significant osseous findings. No chest wall mass. IMPRESSION: 1. 2.9 x 1.1 cm subsolid irregular spiculated nodule in the posterior left upper lobe, not notably changed from last year's studies but has slightly increased in size since 2020 consistent with a slow growing adenocarcinoma. 2. Stable 6 mm ground-glass right middle lobe nodule, new from 2020. 3. Stable ground-glass nodules in the right upper lobe. Adenocarcinoma not excluded. 4. Stable 3 mm solid right middle lobe nodule. 5. Aortic and coronary artery atherosclerosis. 6. Stable dilatation of the left and right main pulmonary arteries. 7. Small hiatal hernia. 8. Osteopenia and degenerative change. Aortic Atherosclerosis (ICD10-I70.0) and Emphysema (ICD10-J43.9). Electronically Signed   By: Aleck Hurdle.D.  On: 02/29/2024 23:57     Past medical hx Past Medical History:  Diagnosis Date   Allergy     Atrial fibrillation (HCC)    a. dx 03/2019 while in hospital with cholecystitis   COPD (chronic obstructive pulmonary disease) (HCC)    Critical lower limb ischemia (HCC)    a. after TAVR developed ischemic L leg likely due to flap/closure, s/p emergent repair of left femoral artery with endarterectomy and Dacron patch angioplasty.   Emphysema lung (HCC)    Emphysema of lung (HCC)    GERD (gastroesophageal reflux disease)    Heart murmur    Hyperlipidemia    LDL goal < 70   Hypertension    Hypomagnesemia    Normal coronary arteries 10/2018   NSVT (nonsustained ventricular tachycardia) (HCC) 03/02/2019   Osteoporosis    Oxygen  deficiency    Pulmonary HTN (HCC) 06/05/2016   Moderate with PASP by echo 04/2016 likely Group 3 from COPD and possibly Group 2 from  pulmonary venous HTN associated with moderate AS - f/u echo 12/2018 post TAVR showed normal RVSP   Pulmonary nodules    seen on pre TAVR CT, needs 12 month CT follow up   S/P TAVR (transcatheter aortic valve replacement) 12/30/2018   Medtronic Evolut Pro-Plus THV (size 26 mm, serial # [id number]) via the TF approach   Sacral fracture (HCC)    Severe aortic stenosis    a. s/p TAVR 12/2018.   Thoracic ascending aortic aneurysm (HCC)    needs yearly follow up   Thyroid  nodule 11/08/2022     Social History   Tobacco Use   Smoking status: Former    Current packs/day: 0.00    Average packs/day: 1.5 packs/day for 48.4 years (72.6 ttl pk-yrs)    Types: Cigarettes    Start date: 72    Quit date: 03/19/2004    Years since quitting: 19.9   Smokeless tobacco: Never   Tobacco comments:    Started smoking at age 74  Vaping Use   Vaping status: Never Used  Substance Use Topics   Alcohol use: Yes    Alcohol/week: 3.0 - 4.0 standard drinks of alcohol    Types: 3 - 4 Glasses of wine per week    Comment: socially   Drug use: No    Ms.[place] reports that she quit smoking about 19 years ago. Her smoking use included cigarettes. She started smoking about 68 years ago. She has a 72.6 pack-year smoking history. She has never used smokeless tobacco. She reports current alcohol use of about 3.0 - 4.0 standard drinks of alcohol per week. She reports that she does not use drugs.  Tobacco Cessation: Counseling given: Not Answered Tobacco comments: Started smoking at age 47 Former smoker quit 2005 with a 72 pack year smoking history  Past surgical hx, Family hx, Social hx all reviewed.  Current Outpatient Medications on File Prior to Visit  Medication Sig   acetaminophen  (TYLENOL ) 325 MG tablet Take 2 tablets (650 mg total) by mouth every 6 (six) hours as needed for mild pain (or Fever >/= 101).   albuterol  (VENTOLIN  HFA) 108 (90 Base) MCG/ACT inhaler Inhale 2 puffs into the lungs every 6 (six) hours  as needed for wheezing or shortness of breath.   amLODipine  (NORVASC ) 5 MG tablet Take 1 tablet (5 mg total) by mouth daily.   amoxicillin  (AMOXIL ) 500 MG tablet Take 4 tablets (2,000mg ) one hour prior to all dental visits.   aspirin  EC 81 MG tablet  Take 81 mg by mouth daily.   BREZTRI  AEROSPHERE 160-9-4.8 MCG/ACT AERO INHALE 2 PUFFS INTO THE LUNGS IN THE MORNING AND AT BEDTIME.   Calcium  Carb-Cholecalciferol (CALCIUM  600 + D PO) Take 600 mg by mouth 3 (three) times a week.    Cholecalciferol (VITAMIN D3) 5000 UNITS CAPS Take 5,000 Units by mouth daily.    denosumab  (PROLIA ) 60 MG/ML SOLN injection Inject 60 mg into the skin every 6 (six) months. Administer in upper arm, thigh, or abdomen   EPINEPHrine  0.3 mg/0.3 mL IJ SOAJ injection Inject 0.3 mg into the muscle as needed for anaphylaxis.   fluticasone (FLONASE) 50 MCG/ACT nasal spray Place 2 sprays into both nostrils daily.   furosemide  (LASIX ) 20 MG tablet DAILY AS NEEDED FOR SWELLING   hydrochlorothiazide  (MICROZIDE ) 12.5 MG capsule TAKE 1 CAPSULE BY MOUTH EVERY DAY   loratadine  (CLARITIN ) 10 MG tablet Take 10 mg by mouth daily as needed for allergies.    Magnesium  200 MG TABS 2 tablets with a meal Orally Once a day   magnesium  oxide (MAG-OX) 400 (240 Mg) MG tablet TAKE 1 TABLET BY MOUTH EVERY DAY   metoprolol  succinate (TOPROL -XL) 25 MG 24 hr tablet TAKE 1 TABLET BY MOUTH EVERY DAY   Multiple Vitamin (MULTIVITAMIN WITH MINERALS) TABS tablet Take 1 tablet by mouth daily.   OXYGEN  Inhale 2 L into the lungs continuous.   Oyster Shell Calcium  500 MG TABS 1 tablet with meals Orally Twice a day   rosuvastatin  (CRESTOR ) 40 MG tablet TAKE 1 TABLET BY MOUTH EVERY DAY   Current Facility-Administered Medications on File Prior to Visit  Medication   [START ON 04/05/2024] denosumab  (PROLIA ) injection 60 mg     No Known Allergies  Review Of Systems:  Constitutional:   No  weight loss, night sweats,  Fevers, chills, fatigue, or   lassitude.  HEENT:   No headaches,  Difficulty swallowing,  Tooth/dental problems, or  Sore throat,                No sneezing, itching, ear ache, nasal congestion, post nasal drip,   CV:  No chest pain,  Orthopnea, PND, swelling in lower extremities, anasarca, dizziness, palpitations, syncope.   GI  No heartburn, indigestion, abdominal pain, nausea, vomiting, diarrhea, change in bowel habits, loss of appetite, bloody stools.   Resp: + shortness of breath with exertion or at rest.  No excess mucus, no productive cough,  No non-productive cough,  No coughing up of blood.  No change in color of mucus.  No wheezing.  No chest wall deformity  Skin: no rash or lesions.  GU: no dysuria, change in color of urine, no urgency or frequency.  No flank pain, no hematuria   MS:  No joint pain or swelling.  No decreased range of motion.  No back pain.  Psych:  No change in mood or affect. No depression or anxiety.  No memory loss.   Vital Signs BP (!) 140/62 (BP Location: Left Arm, Patient Position: Sitting, Cuff Size: Normal)   Pulse 77   Ht 5\' 6"  (1.676 m)   Wt 164 lb 6.4 oz (74.6 kg)   SpO2 92%   BMI 26.53 kg/m    Physical Exam:  General- No distress,  A&Ox3, pleasant ENT: No sinus tenderness, TM clear, pale nasal mucosa, no oral exudate,no post nasal drip, no LAN Cardiac: S1, S2, regular rate and rhythm, no murmur Chest: No wheeze/ rales/ dullness; no accessory muscle use, no nasal flaring, no  sternal retractions, diminished per bases Abd.: Soft Non-tender, ND, BS +, Body mass index is 26.53 kg/m.  Ext: No clubbing cyanosis, edema, no obvious deformities Neuro:  normal strength, no extremities x 4, alert and oriented x 3, appropriate Skin: No rashes, warm and dry, no obvious skin lesions Psych: normal mood and behavior   Assessment/Plan Several stable pulmonary nodules consistent with slowly growing adenocarcinoma Patient prefers CT surveillance versus biopsy Former  smoker Seasonal allergies Plan Please try changing your claritin  to one of the other non-sedating antihistamines like Zyrtec, Xyzol, or Allegra. Generic is ok. Take at bedtime to prevent sleepiness.  Continue Flonase as you have been doing.  Your CT Scan shows stable lung nodules. We discussed biopsy to determine if these are a slow growing adenocarcinoma vs continued surveillance with Ct Chest in 6 months. You prefer to do the CT Scan surveillance at this time.  I have ordered a 6 month CT Chest which will be due end of October/ Beginning of November 2025. Follow up with me after to review results. If you develop any blood  in your sputum or have weight loss you cannot explain, please call to be seen sooner.  We will give you 2 samples of Breztri , and see if you qualify for financial assistance through the manufacturer.  We will give you the paperwork. Any issues with breathing , call to be seen sooner. Have a great summer. Please contact office for sooner follow up if symptoms do not improve or worsen or seek emergency care   I spent 25 minutes dedicated to the care of this patient on the date of this encounter to include pre-visit review of records, face-to-face time with the patient discussing conditions above, post visit ordering of testing, clinical documentation with the electronic health record, making appropriate referrals as documented, and communicating necessary information to the patient's healthcare team.       Raejean Bullock, NP 03/03/2024  1:42 PM

## 2024-03-03 NOTE — Telephone Encounter (Signed)
 Patient dropped off patient assistance form. Put in Dara Ear NP box.

## 2024-03-06 ENCOUNTER — Other Ambulatory Visit: Payer: Self-pay | Admitting: Acute Care

## 2024-03-06 MED ORDER — BREZTRI AEROSPHERE 160-9-4.8 MCG/ACT IN AERO: 2.0000 | INHALATION_SPRAY | Freq: Two times a day (BID) | RESPIRATORY_TRACT | 12 refills | Status: DC

## 2024-03-11 ENCOUNTER — Other Ambulatory Visit: Payer: Self-pay | Admitting: Acute Care

## 2024-03-11 DIAGNOSIS — J449 Chronic obstructive pulmonary disease, unspecified: Secondary | ICD-10-CM

## 2024-03-11 DIAGNOSIS — N907 Vulvar cyst: Secondary | ICD-10-CM | POA: Diagnosis not present

## 2024-03-11 MED ORDER — BREZTRI AEROSPHERE 160-9-4.8 MCG/ACT IN AERO: 2.0000 | INHALATION_SPRAY | Freq: Two times a day (BID) | RESPIRATORY_TRACT | 12 refills | Status: DC

## 2024-03-17 NOTE — Telephone Encounter (Signed)
 Copied from CRM [id number]. Topic: Clinical - Prescription Issue >> Mar 17, 2024 11:44 AM Dominique [name] wrote: Reason for CRM: Patient [phone] wants to speak with nurse about Breztri , patient applied for financial assistant and the prescription has to be sent to the place, patient does not have the information while on this call. Patient will have the information when the nurse calls back. Please advise and call back today.

## 2024-03-17 NOTE — Telephone Encounter (Signed)
 Sent over script will keep patient in the loop on updates for breztri  application.NFN as of right now

## 2024-03-18 ENCOUNTER — Telehealth: Payer: Self-pay | Admitting: Acute Care

## 2024-03-18 NOTE — Telephone Encounter (Signed)
 Cmn received from [employer] for Oxygen .

## 2024-03-20 ENCOUNTER — Other Ambulatory Visit: Payer: Self-pay | Admitting: Cardiovascular Disease

## 2024-03-25 NOTE — Telephone Encounter (Signed)
 Duplicate cmn received.

## 2024-03-26 NOTE — Telephone Encounter (Signed)
 CMN faxed successfully and signed.

## 2024-04-06 ENCOUNTER — Telehealth: Payer: Self-pay

## 2024-04-06 NOTE — Telephone Encounter (Signed)
 Prolia  VOB initiated via [email or web address]  Next Prolia  inj DUE: 05/05/24

## 2024-04-06 NOTE — Telephone Encounter (Signed)
 Pt ready for scheduling for PROLIA  on or after : 05/05/24  Option# 1: Buy/Bill (Office supplied medication)  Out-of-pocket cost due at time of clinic visit: $0  Number of injection/visits approved: ---  Primary: MEDICARE Prolia  co-insurance: 0% Admin fee co-insurance: 0%  Secondary: [id number] Prolia  co-insurance:  Admin fee co-insurance:   Medical Benefit Details: Date Benefits were checked: 04/06/24 Deductible: $257 Met of $257 Required/ Coinsurance: 0%/ Admin Fee: 0%  Prior Auth: N/A PA# Expiration Date:   # of doses approved: ----------------------------------------------------------------------- Option# 2- Med Obtained from pharmacy:  Pharmacy benefit: Copay $--- (Paid to pharmacy) Admin Fee: --- (Pay at clinic)  Prior Auth: --- PA# Expiration Date:   # of doses approved:   If patient wants fill through the pharmacy benefit please send prescription to: ---, and include estimated need by date in rx notes. Pharmacy will ship medication directly to the office.  Patient NOT eligible for Prolia  Copay Card. Copay Card can make patient's cost as little as $25. Link to apply: [email or web address]://[email or web address]  ** This summary of benefits is an estimation of the patient's out-of-pocket cost. Exact cost may very based on individual plan coverage.

## 2024-04-06 NOTE — Telephone Encounter (Signed)
 Dominique [name]

## 2024-04-07 ENCOUNTER — Ambulatory Visit: Payer: Self-pay | Admitting: Internal Medicine

## 2024-04-07 ENCOUNTER — Ambulatory Visit (INDEPENDENT_AMBULATORY_CARE_PROVIDER_SITE_OTHER): Payer: Medicare Other | Admitting: Internal Medicine

## 2024-04-07 ENCOUNTER — Encounter: Payer: Self-pay | Admitting: Internal Medicine

## 2024-04-07 ENCOUNTER — Telehealth (HOSPITAL_BASED_OUTPATIENT_CLINIC_OR_DEPARTMENT_OTHER): Payer: Self-pay | Admitting: *Deleted

## 2024-04-07 ENCOUNTER — Ambulatory Visit (HOSPITAL_BASED_OUTPATIENT_CLINIC_OR_DEPARTMENT_OTHER)
Admission: RE | Admit: 2024-04-07 | Discharge: 2024-04-07 | Disposition: A | Discharge status: Home / Self Care | Source: Ambulatory Visit | Attending: Internal Medicine | Admitting: Internal Medicine

## 2024-04-07 VITALS — BP 136/70 | HR 56 | Temp 97.6°F | Resp 18 | Ht 66.0 in | Wt 163.4 lb

## 2024-04-07 DIAGNOSIS — G459 Transient cerebral ischemic attack, unspecified: Secondary | ICD-10-CM | POA: Insufficient documentation

## 2024-04-07 DIAGNOSIS — I10 Essential (primary) hypertension: Secondary | ICD-10-CM

## 2024-04-07 DIAGNOSIS — Z85118 Personal history of other malignant neoplasm of bronchus and lung: Secondary | ICD-10-CM

## 2024-04-07 DIAGNOSIS — G453 Amaurosis fugax: Secondary | ICD-10-CM

## 2024-04-07 DIAGNOSIS — I6782 Cerebral ischemia: Secondary | ICD-10-CM | POA: Diagnosis not present

## 2024-04-07 DIAGNOSIS — R739 Hyperglycemia, unspecified: Secondary | ICD-10-CM

## 2024-04-07 NOTE — Progress Notes (Addendum)
 Subjective:    Patient ID: Dominique [name], female    DOB: May 28, 1941, 83 y.o.   MRN: [id number]  DOS:  04/07/2024 Type of visit - description: Routine checkup  Chronic medical problems addressed. However her main concern is 2 weeks ago, her left visual field become black, could not see for 2 to 3 minutes. Symptoms spontaneously subsided.  No further episodes Denies headache, nausea or vomiting.  No diplopia, no slurred speech. No fever or chills.  No weight loss. No jaw cladication   Review of Systems See above   Past Medical History:  Diagnosis Date   Allergy     Atrial fibrillation (HCC)    a. dx 03/2019 while in hospital with cholecystitis   COPD (chronic obstructive pulmonary disease) (HCC)    Critical lower limb ischemia (HCC)    a. after TAVR developed ischemic L leg likely due to flap/closure, s/p emergent repair of left femoral artery with endarterectomy and Dacron patch angioplasty.   Emphysema lung (HCC)    Emphysema of lung (HCC)    GERD (gastroesophageal reflux disease)    Heart murmur    Hyperlipidemia    LDL goal < 70   Hypertension    Hypomagnesemia    Normal coronary arteries 10/2018   NSVT (nonsustained ventricular tachycardia) (HCC) 03/02/2019   Osteoporosis    Oxygen  deficiency    Pulmonary HTN (HCC) 06/05/2016   Moderate with PASP by echo 04/2016 likely Group 3 from COPD and possibly Group 2 from pulmonary venous HTN associated with moderate AS - f/u echo 12/2018 post TAVR showed normal RVSP   Pulmonary nodules    seen on pre TAVR CT, needs 12 month CT follow up   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 s/p TAVR 12/2018.   Thoracic ascending aortic aneurysm (HCC)    needs yearly follow up   Thyroid  nodule 11/08/2022    Past Surgical History:  Procedure Laterality Date    APPENDECTOMY  1951   CARDIAC VALVE REPLACEMENT  01/26/19   CATARACT EXTRACTION     CESAREAN SECTION     1971   CHOLECYSTECTOMY N/A 04/28/2019   Procedure: LAPAROSCOPIC CHOLECYSTECTOMY;  Surgeon: Aron Shoulders, MD;  Location: [facility];  Service: General;  Laterality: N/A;   COLONOSCOPY  2018   EYE SURGERY     FEMORAL-POPLITEAL BYPASS GRAFT Left 12/31/2018   Procedure: PATCH ANGIOPLASTY REPAIR LEFT FEMORAL ARTERY USING HEMASHIELD PLATINUM FINESSE PATCH;  Surgeon: Oris Krystal FALCON, MD;  Location: [facility] OR;  Service: Vascular;  Laterality: Left;   IR RADIOLOGY PERIPHERAL GUIDED IV START  05/30/2017   IR RADIOLOGY PERIPHERAL GUIDED IV START  09/02/2018   IR RADIOLOGY PERIPHERAL GUIDED IV START  09/02/2018   IR US  GUIDE VASC ACCESS LEFT  09/02/2018   IR US  GUIDE VASC ACCESS RIGHT  05/30/2017   IR US  GUIDE VASC ACCESS RIGHT  09/02/2018   JOINT REPLACEMENT  2016   sacroplasty   LAMINECTOMY     RIGHT/LEFT HEART CATH AND CORONARY ANGIOGRAPHY N/A 11/27/2018   Procedure: RIGHT/LEFT HEART CATH AND CORONARY ANGIOGRAPHY;  Surgeon: Wonda Sharper, MD;  Location: [facility] INVASIVE CV LAB;  Service: Cardiovascular;  Laterality: N/A;   TEE WITHOUT CARDIOVERSION N/A 12/30/2018   Procedure: TRANSESOPHAGEAL ECHOCARDIOGRAM (TEE);  Surgeon: Wonda Sharper, MD;  Location: [facility] INVASIVE CV LAB;  Service: Open Heart Surgery;  Laterality: N/A;   TOTAL HIP ARTHROPLASTY Bilateral    TRANSCATHETER AORTIC VALVE REPLACEMENT, TRANSFEMORAL N/A 12/30/2018   Procedure: TRANSCATHETER AORTIC VALVE REPLACEMENT, TRANSFEMORAL;  Surgeon: Wonda Sharper, MD;  Location: [facility] INVASIVE CV LAB;  Service: Open Heart Surgery;  Laterality: N/A;   TUBAL LIGATION  1975    Current Outpatient Medications  Medication Instructions   acetaminophen  (TYLENOL ) 650 mg, Oral, Every 6 hours PRN   albuterol  (VENTOLIN  HFA) 108 (90 Base) MCG/ACT inhaler 2 puffs, Inhalation, Every 6 hours PRN   amLODipine  (NORVASC ) 5 mg, Oral, Daily   amoxicillin  (AMOXIL ) 500 MG  tablet Take 4 tablets (2,000mg ) one hour prior to all dental visits.   aspirin  EC 81 mg, Daily   budeson-glycopyrrolate-formoterol  (BREZTRI  AEROSPHERE) 160-9-4.8 MCG/ACT AERO inhaler 2 puffs, Inhalation, 2 times daily   budeson-glycopyrrolate-formoterol  (BREZTRI  AEROSPHERE) 160-9-4.8 MCG/ACT AERO inhaler 2 puffs, Inhalation, 2 times daily   budesonide -glycopyrrolate-formoterol  (BREZTRI  AEROSPHERE) 160-9-4.8 MCG/ACT AERO inhaler 2 puffs, Inhalation, 2 times daily   Calcium  Carb-Cholecalciferol (CALCIUM  600 + D PO) 600 mg, 3 times weekly   denosumab  (PROLIA ) 60 mg, Every 6 months   EPINEPHrine  (EPI-PEN) 0.3 mg, As needed   fluticasone (FLONASE) 50 MCG/ACT nasal spray 2 sprays, Daily   furosemide  (LASIX ) 20 MG tablet DAILY AS NEEDED FOR SWELLING   hydrochlorothiazide  (MICROZIDE ) 12.5 MG capsule Oral, Daily   loratadine  (CLARITIN ) 10 mg, Daily PRN   Magnesium  200 MG TABS 2 tablets with a meal Orally Once a day   magnesium  oxide (MAG-OX) 400 mg, Oral, Daily   metoprolol  succinate (TOPROL -XL) 25 mg, Oral, Daily   Multiple Vitamin (MULTIVITAMIN WITH MINERALS) TABS tablet 1 tablet, Daily   OXYGEN  2 L, Continuous   Oyster Shell Calcium  500 MG TABS 1 tablet with meals Orally Twice a day   rosuvastatin  (CRESTOR ) 40 mg, Oral, Daily   Vitamin D3 5,000 Units, Daily       Objective:   Physical Exam BP 136/70   Pulse (!) 56   Temp 97.6 F (36.4 C) (Oral)   Resp 18   Ht 5' 6 (1.676 m)   Wt 163 lb 6 oz (74.1 kg)   SpO2 97%   BMI 26.37 kg/m  General:   Well developed, NAD, BMI noted. HEENT:  Normocephalic . Face symmetric, atraumatic. Not TTP at temples  Neck: Normal carotid pulses; Lungs:  CTA B Normal respiratory effort, no intercostal retractions, no accessory muscle use. Heart: RRR, + systolic murmur, more noticeable right from the sternum. Lower extremities: no pretibial edema bilaterally  Skin: Not pale. Not jaundice Neurologic:  alert & oriented X3.  Speech normal, gait  appropriate for age and unassisted. Motor symmetric, face symmetric. Psych--  Cognition and judgment appear intact.  Cooperative with normal attention span and concentration.  Behavior appropriate. No anxious or depressed appearing.      Assessment   Assessment Hyperglycemia HTN Hyperlipidemia PULM: -Emphysema, quit tobacco 2006 --Nocturnal O2 as needed ----Pulmonary nodules- possibly cancer.  Declined bx, prefers surveillance ----enlarged R P.A. and Ao CV: -S/p TVAR 12/2018 -Paroxysmal A. fib DX 6/28/ 2020 in the context of acute cholecystitis, converted to NSR, not anticoagulated   -12/31/2018 ischemic left leg after TEVAR, endarterectomy performed --Nonobstructive CAD (per coronary CTA) -Aspirin  indefinitely, SBE prophylaxis with amoxicillin  -MSK --DJD --Sacral Fx, sacral insufficiency, s/p sacroplasty (radiology) 07-2015 --H/o osteoporosis :  took fosamax while in [place] , Fosamax rx again by ortho after sacral Fx 07-2015, never took it, first Prolia  11-14-2015 --T score 08-2015 >>  normal (likely normal d/t DJD?) H/o vit D def: Normal level 09-2015  Thyroid  nodules- per Dr Tommas , s/p bx 01/2023      PLAN  TIA/amaurosis fugax -Episode of left-sided visual field loss.  Neurological exam benign -EKG today: Sinus bradycardia.  No A-fib. -H/o  atypical migraines, evaluated by neurology in 2020, MRA negative brain MRA negative, brain MRI with mild small vessel disease.  Recent visual disturbance is different from typical migraines. -Echo 2023: Good EF, grade 1 diastolic dysfunction.  S/p TEVAR, aortic valve regurg is trivial. -Last carotid US : 05/2023, 1 to 39% bilaterally Plan: CT head, sed rate, echo, carotid ultrasound, neurology referral, notify cardiology.  ER if symptoms increase. 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   Plan: Brain MRI with and without while we wait  for the neurology visit.   Pulmonary nodules, felt to be slow-growing adenocarcinoma, patient prefers CT surveillance over biopsy.  Last seen by pulmonary 03/03/2024 Goiter: Seen by Dr. Tommas 01/20/2024. Peripheral vascular disease: Ischemic leg 2020 after TVAR, arterial duplex 12/23/2023.  Mild disease, Rx medical management see message 01/02/2024

## 2024-04-07 NOTE — Telephone Encounter (Signed)
 Advised patient, verbalized understanding  Message sent to scheduling team to reach out and get appointment for patient

## 2024-04-07 NOTE — Patient Instructions (Signed)
  Will refer you to neurology Will get an echocardiogram and a carotid ultrasound Continue same medications If you have any more symptoms: Call 911      GO TO THE LAB :  Get the blood work   Your results will be posted on MyChart with my comments  Next office visit for a checkup in 3 months Please make an appointment before you leave today   Go to the first floor and proceed with a CT scan of your head.

## 2024-04-07 NOTE — Telephone Encounter (Signed)
 Dr. Theodis Fiscal, our mutual patient had a episode of amaurosis fugax. I am ordering a workup but I like you to be aware in case you need to see her sooner or if you have any suggestion. Thank you! JP   Thanks for the update. I'll add a 30 day monitor and see her after   Above conversation between Dr Neomi Banks and Dr Chesley Cost monitor as requested   Sent mychart message

## 2024-04-08 LAB — BASIC METABOLIC PANEL WITH GFR
BUN: 16 mg/dL (ref 7–25)
CO2: 29 mmol/L (ref 20–32)
Calcium: 9.7 mg/dL (ref 8.6–10.4)
Chloride: 100 mmol/L (ref 98–110)
Creat: 0.86 mg/dL (ref 0.60–0.95)
Glucose, Bld: 83 mg/dL (ref 65–99)
Potassium: 3.8 mmol/L (ref 3.5–5.3)
Sodium: 139 mmol/L (ref 135–146)
eGFR: 67 mL/min/{1.73_m2} (ref >=60)

## 2024-04-08 LAB — HEMOGLOBIN A1C
Hgb A1c MFr Bld: 5.9 % — ABNORMAL HIGH (ref <5.7)
Mean Plasma Glucose: 123 mg/dL
eAG (mmol/L): 6.8 mmol/L

## 2024-04-08 LAB — CBC WITH DIFFERENTIAL/PLATELET
Absolute Lymphocytes: 1430 {cells}/uL (ref 850–3900)
Absolute Monocytes: 835 {cells}/uL (ref 200–950)
Basophils Absolute: 58 {cells}/uL (ref 0–200)
Basophils Relative: 0.6 %
Eosinophils Absolute: 288 {cells}/uL (ref 15–500)
Eosinophils Relative: 3 %
HCT: 42.5 % (ref 35.0–45.0)
Hemoglobin: 13.6 g/dL (ref 11.7–15.5)
MCH: 30.2 pg (ref 27.0–33.0)
MCHC: 32 g/dL (ref 32.0–36.0)
MCV: 94.4 fL (ref 80.0–100.0)
MPV: 11.7 fL (ref 7.5–12.5)
Monocytes Relative: 8.7 %
Neutro Abs: 6989 {cells}/uL (ref 1500–7800)
Neutrophils Relative %: 72.8 %
Platelets: 292 10*3/uL (ref 140–400)
RBC: 4.5 10*6/uL (ref 3.80–5.10)
RDW: 12.7 % (ref 11.0–15.0)
Total Lymphocyte: 14.9 %
WBC: 9.6 10*3/uL (ref 3.8–10.8)

## 2024-04-08 LAB — SEDIMENTATION RATE: Sed Rate: 19 mm/h (ref 0–30)

## 2024-04-08 NOTE — Assessment & Plan Note (Addendum)
 TIA/amaurosis fugax -Episode of left-sided visual field loss.  Neurological exam benign -EKG today: Sinus bradycardia.  No A-fib. -H/o  atypical migraines, evaluated by neurology in 2020, MRA negative brain MRA negative, brain MRI with mild small vessel disease.  Recent visual disturbance is different from typical migraines. -Echo 2023: Good EF, grade 1 diastolic dysfunction.  S/p TEVAR, aortic valve regurg is trivial. -Last carotid US : 05/2023, 1 to 39% bilaterally Plan: CT head, sed rate, echo, carotid ultrasound, neurology referral, notify cardiology.  ER if symptoms increase. 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   Plan: Brain MRI with and without while we wait for the neurology visit.   Pulmonary nodules, felt to be slow-growing adenocarcinoma, patient prefers CT surveillance over biopsy.  Last seen by pulmonary 03/03/2024 Goiter: Seen by Dr. Tommas 01/20/2024. Peripheral vascular disease: Ischemic leg 2020 after TVAR, arterial duplex 12/23/2023.  Mild disease, Rx medical management see message 3/6/2025atient prefers CT surveillance over biopsy.  Last seen by pulmonary 03/03/2024.

## 2024-04-13 ENCOUNTER — Telehealth (HOSPITAL_BASED_OUTPATIENT_CLINIC_OR_DEPARTMENT_OTHER): Payer: Self-pay | Admitting: Cardiovascular Disease

## 2024-04-13 ENCOUNTER — Telehealth: Payer: Self-pay | Admitting: *Deleted

## 2024-04-13 NOTE — Telephone Encounter (Signed)
 Patient following up on heart monitor order, please advise.

## 2024-04-13 NOTE — Telephone Encounter (Signed)
 Patient scheduled to have a [employer] cardiac event monitor applied at the [facility] location, 04/14/24, 11:00AM.

## 2024-04-13 NOTE — Telephone Encounter (Signed)
 Rc'd another signed fax from [employer] for O2 service. Will fax to [phone] w/Chart notes. Once fax confirmation rec'd will send to scan.

## 2024-04-14 ENCOUNTER — Ambulatory Visit: Attending: Cardiovascular Disease

## 2024-04-14 ENCOUNTER — Telehealth: Payer: Self-pay | Admitting: Internal Medicine

## 2024-04-14 DIAGNOSIS — G453 Amaurosis fugax: Secondary | ICD-10-CM | POA: Diagnosis not present

## 2024-04-14 DIAGNOSIS — R001 Bradycardia, unspecified: Secondary | ICD-10-CM | POA: Diagnosis not present

## 2024-04-14 NOTE — Progress Notes (Unsigned)
 [employer] event serial # [id number] from office inventory applied to patient.

## 2024-04-14 NOTE — Telephone Encounter (Signed)
 Can someone check status of echo order and neuro referral please? Neuro referral was urgent

## 2024-04-14 NOTE — Telephone Encounter (Signed)
 Copied from CRM [id number]. Topic: Appointments - Appointment Scheduling >> Apr 14, 2024  8:48 AM Juleen Oakland F wrote: Patient requested a call back today regarding scheduling echocardiogram and appt with neurology. Please call [phone] (M)

## 2024-04-16 ENCOUNTER — Ambulatory Visit (INDEPENDENT_AMBULATORY_CARE_PROVIDER_SITE_OTHER)

## 2024-04-16 DIAGNOSIS — G459 Transient cerebral ischemic attack, unspecified: Secondary | ICD-10-CM

## 2024-04-16 LAB — ECHOCARDIOGRAM COMPLETE
AR max vel: 1.15 cm2
AV Area VTI: 1.19 cm2
AV Area mean vel: 1.13 cm2
AV Mean grad: 9 mmHg
AV Peak grad: 17 mmHg
Ao pk vel: 2.06 m/s
Area-P 1/2: 2.87 cm2
MV M vel: 1.96 m/s
MV Peak grad: 15.4 mmHg
S' Lateral: 3.18 cm

## 2024-04-19 ENCOUNTER — Ambulatory Visit (HOSPITAL_BASED_OUTPATIENT_CLINIC_OR_DEPARTMENT_OTHER)
Admission: RE | Admit: 2024-04-19 | Discharge: 2024-04-19 | Disposition: A | Discharge status: Home / Self Care | Source: Ambulatory Visit | Attending: Internal Medicine | Admitting: Internal Medicine

## 2024-04-19 DIAGNOSIS — G459 Transient cerebral ischemic attack, unspecified: Secondary | ICD-10-CM | POA: Insufficient documentation

## 2024-04-19 DIAGNOSIS — Z85118 Personal history of other malignant neoplasm of bronchus and lung: Secondary | ICD-10-CM | POA: Insufficient documentation

## 2024-04-19 MED ORDER — GADOBUTROL 1 MMOL/ML IV SOLN
7.5000 mL | Freq: Once | INTRAVENOUS | Status: AC | PRN
Start: 2024-04-19 — End: 2024-04-19
  Administered 2024-04-19: 7.5 mL via INTRAVENOUS

## 2024-04-20 ENCOUNTER — Telehealth: Payer: Self-pay

## 2024-04-20 DIAGNOSIS — J449 Chronic obstructive pulmonary disease, unspecified: Secondary | ICD-10-CM

## 2024-04-20 MED ORDER — BREZTRI AEROSPHERE 160-9-4.8 MCG/ACT IN AERO
2.0000 | INHALATION_SPRAY | Freq: Two times a day (BID) | RESPIRATORY_TRACT | Status: DC
Start: 2024-04-20 — End: 2024-05-05

## 2024-04-20 MED ORDER — BREZTRI AEROSPHERE 160-9-4.8 MCG/ACT IN AERO: 2.0000 | INHALATION_SPRAY | Freq: Two times a day (BID) | RESPIRATORY_TRACT | 12 refills | Status: DC

## 2024-04-20 NOTE — Telephone Encounter (Signed)
 Copied from CRM [id number]. Topic: Clinical - Prescription Issue >> Apr 20, 2024 12:34 PM Dominique [name] wrote: Reason for CRM: Patient states she's been waiting for [name] call back in regard to her Dominique [name] - states she's completely out and would like to know if there are any samples. Requesting [name] call back.   Callback number: [phone]   I called and spoke to pt. Pt states she spoke to [facility] and they are needing [name] new RX. Pt states in the meantime, she would like [name] sample. I informed pt that I would send over [name] RX to them and leave her 2 samples of the Breztri  in our front office. Pt verbalized understanding. NFN

## 2024-04-21 ENCOUNTER — Telehealth: Payer: Self-pay | Admitting: Neurology

## 2024-04-21 ENCOUNTER — Encounter: Payer: Self-pay | Admitting: Neurology

## 2024-04-21 ENCOUNTER — Ambulatory Visit (INDEPENDENT_AMBULATORY_CARE_PROVIDER_SITE_OTHER): Admitting: Neurology

## 2024-04-21 VITALS — BP 156/69 | HR 72 | Ht 66.0 in | Wt 163.8 lb

## 2024-04-21 DIAGNOSIS — Z8669 Personal history of other diseases of the nervous system and sense organs: Secondary | ICD-10-CM

## 2024-04-21 DIAGNOSIS — G459 Transient cerebral ischemic attack, unspecified: Secondary | ICD-10-CM | POA: Diagnosis not present

## 2024-04-21 DIAGNOSIS — H5462 Unqualified visual loss, left eye, normal vision right eye: Secondary | ICD-10-CM

## 2024-04-21 NOTE — Progress Notes (Addendum)
 [facility] [address] [place]. [place] [address] [phone]       OFFICE CONSULT NOTE  [name] Date of Birth:  1941-05-02 Medical Record Number:  [id number]   Referring MD: Aloysius Mech  Reason for Referral: Left eye vision loss  HPI: Dominique [name] is a pleasant 83 year old Caucasian lady seen today for office consultation visit for transient vision loss.  History is obtained from the patient and review of electronic medical records and I personally reviewed pertinent available imaging films in PACS.  She has past medical history of hypertension, hyperlipidemia, COPD, gastroesophageal reflux disease, NSVT, osteoporosis, status post TAVR for severe aortic stenosis.  History of atypical migraines seen by us  in 2020 with last office visit being on 09/19/2020 with [facility] nurse practitioner.  Patient states that about 4 weeks ago she had an episode and then all of a sudden the left eye became black and she could not see.  This lasted only 1 to 2 minutes.  There is no accompanying headache or any other symptoms.  This happened out of the blue without any warning.  She has to have migraine headaches but these occur that are quite variable frequency from once every few weeks to every few months.  She has not had any migraine headaches since then and she has never had vision loss with her previous migraine episodes.  She denies any symptoms of jaw claudication, scalp tenderness arthralgias or myalgias.  She had a CT scan of the head done which was unremarkable and an MRI scan of the brain was done on 04/19/2024 which has not been read yet but by my personal review shows no acute abnormality and only changes of chronic small vessel disease.  Her last CT angiogram of the brain on 03/02/2021 had shown 60 to close 70% right ICA no significant ICA stenosis.  Hemoglobin A1c on 04/07/2024 was 5.9.  Last lipid profile on 11/28/2023 showed LDL cholesterol to be 70 mg percent.  ESR on  04/07/2024 was 19.  Patient is currently wearing an external heart monitor but denies any history of palpitations, syncope she has listed history of NSVT in the past medical history. Prior office visits :  09/19/2020,( JM ) [name] returns for 81-month follow-up.  She has been stable since prior visit reporting occasional migraine headaches associated with transient blurred vision but relatively stable without increase in frequency or duration.  She remains on aspirin  and Crestor  for stroke prevention without side effects.  Blood pressure today 125/66.  No concerns at this time       History provided for reference purposes only Update 03/15/2020 JM: [name] returns for 66-month follow-up.  Since prior visit, she has been doing well without new or reoccurring stroke symptoms. Occasional headaches with improvement with possibly one time monthly. Continues on aspirin  and Crestor  for secondary stroke prevention.  Blood pressure today 132/62. Recent LDL 53 on 02/18/2020. Recent carotid ultrasound ordered by cardiology on 03/07/2020 showed bilateral ICA 1 to 39% stenosis without evidence of right ICA 60 to 79% stenosis as evidenced on prior exam.  No concerns at this time.   Update 09/14/2019 Dr. Rosemarie : She returns for follow-up after last visit 3 months ago.  She is doing well she is not having recurrent TIA or stroke symptoms.  She still having some episodes of headache and blurred vision about once every 2 weeks or so.  These are much improved and not as severe or frequent.  She is  unable to and without specific triggers for these episodes.  She underwent MRI scan of the brain on 06/30/2019 which I personally reviewed shows mild age-appropriate changes of small vessel disease but no acute abnormality stroke or tumor.  MRI of the brain shows no significant large vessel stenosis or occlusion.  She saw cardiologist Dr. Raford last week who ordered carotid ultrasound 12 done on 06/01/2019 showed 60 to 79% right  ICA and 1-39% left ICA stenosis.  Evaluation of a cardiac monitoring not showing any recent A. fib.  Patient states she is doing well and has had no new complaints   Initial consult 05/28/2019 Dr. Darra. Gotay is a 83 year old Caucasian lady of [place] origin who is seen today for initial office consultation visit for headaches and blurred vision episodes.  History is obtained from her, review of electronic medical records and personally reviewed imaging films in PACS.  She states that she has a longstanding history of intermittent headaches and blurred vision since she was in her 40s.  She had outgrown them but when she was in her late 24s they started recurring at a frequency of once per month or so.  However since the last 3 weeks following gallbladder surgery on 04/28/2019 she has had daily headaches as well as blurred vision episodes which surprisingly seem to have stopped in the last 2 days.  She describes the headache as starting in the left temples with moderate intensity not accompanied by nausea vomiting light or sound sensitivity.  She takes Tylenol  which seems to work and the headache goes away.  The blurred vision involving both eyes and last about 5 to 10 minutes.  After she has a cup of coffee with sugar that disappears as well.  She denies any true vertigo, speech difficulties, loss of vision, gait or balance problems or numbness.  She denies any episodes in the past suggestive of strokes or definite TIAs.  She was recently diagnosed to have atrial fibrillation on 04/26/2019 and is currently wearing a zio patch monitor to further documented.  She has a chads 2 vascular score of 4.  She is currently on Toprol -XL she was previously on aspirin  and Plavix  but after her gallbladder surgery was switched only to aspirin  alone.  She is tolerating well without bruising or bleeding.  She denies symptoms of scalp tenderness, jaw claudication, arthralgias or myalgias.  Blood pressures well controlled today  it is 137/76.  She has history of aortic valve replacement transfemoral early following which she had some complications requiring emergent femoral arterial endarterectomy with Dacron patch angioplasty.  She did undergo CT scan of the head on 05/25/2019 which I personally reviewed shows no acute abnormality.  She is not had an MRI.  She is scheduled to undergo carotid ultrasound on 06/01/2019.  She has not had any recent lipid profile or hemoglobin A1c checked.   ROS:   14 system review of systems is positive for painless vision loss all other systems negative  PMH:  Past Medical History:  Diagnosis Date   Allergy     Atrial fibrillation (HCC)    a. dx 03/2019 while in hospital with cholecystitis   COPD (chronic obstructive pulmonary disease) (HCC)    Critical lower limb ischemia (HCC)    a. after TAVR developed ischemic L leg likely due to flap/closure, s/p emergent repair of left femoral artery with endarterectomy and Dacron patch angioplasty.   Emphysema lung (HCC)    Emphysema of lung (HCC)    GERD (gastroesophageal reflux disease)  Heart murmur    Hyperlipidemia    LDL goal < 70   Hypertension    Hypomagnesemia    Normal coronary arteries 10/2018   NSVT (nonsustained ventricular tachycardia) (HCC) 03/02/2019   Osteoporosis    Oxygen  deficiency    Pulmonary HTN (HCC) 06/05/2016   Moderate with PASP by echo 04/2016 likely Group 3 from COPD and possibly Group 2 from pulmonary venous HTN associated with moderate AS - f/u echo 12/2018 post TAVR showed normal RVSP   Pulmonary nodules    seen on pre TAVR CT, needs 12 month CT follow up   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 s/p TAVR 12/2018.   Thoracic ascending aortic aneurysm (HCC)    needs yearly follow up   Thyroid  nodule 11/08/2022    Social History:  Social  History   Socioeconomic History   Marital status: Widowed    Spouse name: Not on file   Number of children: 2   Years of education: Not on file   Highest education level: Professional school degree (e.g., MD, DDS, DVM, JD)  Occupational History   Occupation: n/a  Tobacco Use   Smoking status: Former    Current packs/day: 0.00    Average packs/day: 1.5 packs/day for 48.4 years (72.6 ttl pk-yrs)    Types: Cigarettes    Start date: 50    Quit date: 03/19/2004    Years since quitting: 20.1   Smokeless tobacco: Never   Tobacco comments:    Started smoking at age 64  Vaping Use   Vaping status: Never Used  Substance and Sexual Activity   Alcohol use: Yes    Alcohol/week: 3.0 - 4.0 standard drinks of alcohol    Types: 3 - 4 Glasses of wine per week    Comment: socially   Drug use: No   Sexual activity: Not Currently  Other Topics Concern   Not on file  Social History Narrative   From [place]   Lost a son to DM   1 living son   Social Drivers of Corporate investment banker Strain: Low Risk  (12/02/2023)   Overall Financial Resource Strain (CARDIA)    Difficulty of Paying Living Expenses: Not very hard  Food Insecurity: No Food Insecurity (12/02/2023)   Hunger Vital Sign    Worried About Running Out of Food in the Last Year: Never true    Ran Out of Food in the Last Year: Never true  Transportation Needs: No Transportation Needs (12/02/2023)   PRAPARE - Administrator, Civil Service (Medical): No    Lack of Transportation (Non-Medical): No  Physical Activity: Insufficiently Active (12/02/2023)   Exercise Vital Sign    Days of Exercise per Week: 2 days    Minutes of Exercise per Session: 10 min  Stress: No Stress Concern Present (12/02/2023)   [employer] of Occupational Health - Occupational Stress Questionnaire    Feeling of Stress : Only a little  Social Connections: Unknown (12/02/2023)   Social Connection and Isolation Panel    Frequency of Communication  with Friends and Family: More than three times a week    Frequency of Social Gatherings with Friends and Family: More than three times a week    Attends Religious Services: Patient declined    Active Member of  Clubs or Organizations: Yes    Attends Banker Meetings: More than 4 times per year    Marital Status: Widowed  Intimate Partner Violence: Not At Risk (08/28/2023)   Humiliation, Afraid, Rape, and Kick questionnaire    Fear of Current or Ex-Partner: No    Emotionally Abused: No    Physically Abused: No    Sexually Abused: No    Medications:   Current Outpatient Medications on File Prior to Visit  Medication Sig Dispense Refill   acetaminophen  (TYLENOL ) 325 MG tablet Take 2 tablets (650 mg total) by mouth every 6 (six) hours as needed for mild pain (or Fever >/= 101).     amLODipine  (NORVASC ) 5 MG tablet Take 1 tablet (5 mg total) by mouth daily. 90 tablet 1   aspirin  EC 81 MG tablet Take 81 mg by mouth daily.     budesonide -glycopyrrolate-formoterol  (BREZTRI  AEROSPHERE) 160-9-4.8 MCG/ACT AERO inhaler Inhale 2 puffs into the lungs in the morning and at bedtime. 1 each 12   budesonide -glycopyrrolate-formoterol  (BREZTRI  AEROSPHERE) 160-9-4.8 MCG/ACT AERO inhaler Inhale 2 puffs into the lungs in the morning and at bedtime.     Calcium  Carb-Cholecalciferol (CALCIUM  600 + D PO) Take 600 mg by mouth 3 (three) times a week.      Cholecalciferol (VITAMIN D3) 5000 UNITS CAPS Take 5,000 Units by mouth daily.      denosumab  (PROLIA ) 60 MG/ML SOLN injection Inject 60 mg into the skin every 6 (six) months. Administer in upper arm, thigh, or abdomen     fluticasone (FLONASE) 50 MCG/ACT nasal spray Place 2 sprays into both nostrils daily.     hydrochlorothiazide  (MICROZIDE ) 12.5 MG capsule TAKE 1 CAPSULE BY MOUTH EVERY DAY 90 capsule 1   loratadine  (CLARITIN ) 10 MG tablet Take 10 mg by mouth daily as needed for allergies.      Magnesium  200 MG TABS 2 tablets with a meal Orally Once a  day     magnesium  oxide (MAG-OX) 400 (240 Mg) MG tablet TAKE 1 TABLET BY MOUTH EVERY DAY 90 tablet 3   metoprolol  succinate (TOPROL -XL) 25 MG 24 hr tablet TAKE 1 TABLET BY MOUTH EVERY DAY 90 tablet 3   Multiple Vitamin (MULTIVITAMIN WITH MINERALS) TABS tablet Take 1 tablet by mouth daily.     OXYGEN  Inhale 2 L into the lungs continuous.     Oyster Shell Calcium  500 MG TABS 1 tablet with meals Orally Twice a day     rosuvastatin  (CRESTOR ) 40 MG tablet TAKE 1 TABLET BY MOUTH EVERY DAY 90 tablet 3   albuterol  (VENTOLIN  HFA) 108 (90 Base) MCG/ACT inhaler Inhale 2 puffs into the lungs every 6 (six) hours as needed for wheezing or shortness of breath. (Patient not taking: Reported on 04/21/2024) 18 g 3   amoxicillin  (AMOXIL ) 500 MG tablet Take 4 tablets (2,000mg ) one hour prior to all dental visits. (Patient not taking: Reported on 04/21/2024) 12 tablet 3   EPINEPHrine  0.3 mg/0.3 mL IJ SOAJ injection Inject 0.3 mg into the muscle as needed for anaphylaxis. (Patient not taking: Reported on 04/21/2024)  12   furosemide  (LASIX ) 20 MG tablet DAILY AS NEEDED FOR SWELLING (Patient not taking: Reported on 04/21/2024) 30 tablet 3   Current Facility-Administered Medications on File Prior to Visit  Medication Dose Route Frequency Provider Last Rate Last Admin   denosumab  (PROLIA ) injection 60 mg  60 mg Subcutaneous Once Paz, Jose E, MD        Allergies:  No Known Allergies  Physical Exam General: well developed, well nourished pleasant elderly Caucasian lady, seated, in no evident distress Head: head normocephalic and atraumatic.   Neck: supple with no carotid or supraclavicular bruits Cardiovascular: regular rate and rhythm, no murmurs Musculoskeletal: no deformity Skin:  no rash/petichiae Vascular:  Normal pulses all extremities  Neurologic Exam Mental Status: Awake and fully alert. Oriented to place and time. Recent and remote memory intact. Attention span, concentration and fund of knowledge  appropriate. Mood and affect appropriate.  MMSE 27/30.  Geriatric depression scale not depressed.  Functional activity questionnaire she is fully independently functioning Cranial Nerves: Fundoscopic exam reveals sharp disc margins. Pupils equal, briskly reactive to light. Extraocular movements full without nystagmus. Visual fields full to confrontation. Hearing intact. Facial sensation intact. Face, tongue, palate moves normally and symmetrically.  Motor: Normal bulk and tone. Normal strength in all tested extremity muscles. Sensory.: intact to touch , pinprick , position and vibratory sensation.  Coordination: Rapid alternating movements normal in all extremities. Finger-to-nose and heel-to-shin performed accurately bilaterally. Gait and Station: Arises from chair without difficulty. Stance is normal. Gait demonstrates normal stride length and balance . Able to heel, toe and tandem walk without difficulty.  Reflexes: 1+ and symmetric. Toes downgoing.   NIHSS  0 Modified Rankin  0    04/21/2024    1:41 PM  MMSE - Mini Mental State Exam  Orientation to time 5  Orientation to Place 5  Registration 3  Attention/ Calculation 3  Recall 3  Language- name 2 objects 2  Language- repeat 0  Language- follow 3 step command 3  Language- read & follow direction 1  Write a sentence 1  Copy design 1  Total score 27     ASSESSMENT: 83 year old Caucasian lady with painless left eye vision loss in June 2025 of unclear etiology.  Temporal arteritis is unlikely in the absence of accompanying headaches and normal ESR.  Acute ischemic optic neuropathy also unlikely due to the very transient nature of symptoms.  Given prior history of typical migraine and known vascular risk factors TIA remains a possibility given prior history of known right moderate carotid stenosis.SABRA     PLAN:I had a long d/w patient about her recent episode of transient painless left eye vision loss and discussed differential diagnosis  and  risk for recurrent stroke/TIAs, personally independently reviewed imaging studies and stroke evaluation results and answered questions.Continue aspirin  81 mg daily  for secondary stroke prevention and maintain strict control of hypertension with blood pressure goal below 130/90, diabetes with hemoglobin A1c goal below 6.5% and lipids with LDL cholesterol goal below 70 mg/dL. I also advised the patient to eat a healthy diet with plenty of whole grains, cereals, fruits and vegetables, exercise regularly and maintain ideal body weight .check CT angiogram of the brain and neck to look for any left carotid symptomatic stenosis.  Followup in the future with my nurse practitioner in 6 months or call earlier if necessary. Greater than 50% time during this 45-minute consultation visit was spent in counseling and coordination of care and discussion with patient about her transient vision loss and answering questions Eather Popp, MD Note: This document was prepared with digital dictation and possible smart phrase technology. Any transcriptional errors that result from this process are unintentional.

## 2024-04-21 NOTE — Patient Instructions (Signed)
 I had a long d/w patient about her recent episode of transient painless left eye vision loss and discussed differential diagnosis and  risk for recurrent stroke/TIAs, personally independently reviewed imaging studies and stroke evaluation results and answered questions.Continue aspirin  81 mg daily  for secondary stroke prevention and maintain strict control of hypertension with blood pressure goal below 130/90, diabetes with hemoglobin A1c goal below 6.5% and lipids with LDL cholesterol goal below 70 mg/dL. I also advised the patient to eat a healthy diet with plenty of whole grains, cereals, fruits and vegetables, exercise regularly and maintain ideal body weight .check CT angiogram of the brain and neck to look for any left carotid symptomatic stenosis.  Followup in the future with my nurse practitioner in 6 months or call earlier if necessary.

## 2024-04-21 NOTE — Telephone Encounter (Signed)
 no auth required sent to GI [phone]

## 2024-04-22 NOTE — Telephone Encounter (Signed)
 Patient has been seen by neuro & is scheduled for her echo.

## 2024-04-24 ENCOUNTER — Telehealth: Payer: Self-pay | Admitting: Neurology

## 2024-04-24 NOTE — Telephone Encounter (Signed)
 Pt asking to be called to confirm if she needs to keep both the 6-30 GI appointment and the 7-1 Carotid appointment, please call.

## 2024-04-24 NOTE — Telephone Encounter (Signed)
 Spoke w/ Dr. Vear who is on call. Reports CT angio head/neck better test and recommends doing this test. Does not need Carotid US .   I called pt at [phone]. LVM for her to call office. Also called [phone]. Spoke w/ pt. Relayed above. She will call to cx Carotid US .

## 2024-04-27 ENCOUNTER — Ambulatory Visit
Admission: RE | Admit: 2024-04-27 | Discharge: 2024-04-27 | Disposition: A | Discharge status: Home / Self Care | Source: Ambulatory Visit | Attending: Neurology | Admitting: Neurology

## 2024-04-27 DIAGNOSIS — E049 Nontoxic goiter, unspecified: Secondary | ICD-10-CM | POA: Diagnosis not present

## 2024-04-27 DIAGNOSIS — I6523 Occlusion and stenosis of bilateral carotid arteries: Secondary | ICD-10-CM | POA: Diagnosis not present

## 2024-04-27 DIAGNOSIS — I6782 Cerebral ischemia: Secondary | ICD-10-CM | POA: Diagnosis not present

## 2024-04-27 MED ORDER — IOPAMIDOL (ISOVUE-370) INJECTION 76%
75.0000 mL | Freq: Once | INTRAVENOUS | Status: AC | PRN
  Administered 2024-04-27: 75 mL via INTRAVENOUS

## 2024-04-28 ENCOUNTER — Encounter (HOSPITAL_COMMUNITY): Payer: Self-pay

## 2024-04-28 ENCOUNTER — Ambulatory Visit (HOSPITAL_COMMUNITY)

## 2024-05-03 ENCOUNTER — Other Ambulatory Visit: Payer: Self-pay

## 2024-05-03 ENCOUNTER — Emergency Department (HOSPITAL_BASED_OUTPATIENT_CLINIC_OR_DEPARTMENT_OTHER)

## 2024-05-03 ENCOUNTER — Inpatient Hospital Stay (HOSPITAL_BASED_OUTPATIENT_CLINIC_OR_DEPARTMENT_OTHER)
Admission: EM | Admit: 2024-05-03 | Discharge: 2024-05-05 | DRG: 193 | Disposition: A | Discharge status: Home / Self Care | Attending: Family Medicine | Admitting: Family Medicine

## 2024-05-03 ENCOUNTER — Encounter (HOSPITAL_BASED_OUTPATIENT_CLINIC_OR_DEPARTMENT_OTHER): Payer: Self-pay

## 2024-05-03 DIAGNOSIS — I959 Hypotension, unspecified: Secondary | ICD-10-CM | POA: Diagnosis present

## 2024-05-03 DIAGNOSIS — Z7951 Long term (current) use of inhaled steroids: Secondary | ICD-10-CM | POA: Diagnosis not present

## 2024-05-03 DIAGNOSIS — I7121 Aneurysm of the ascending aorta, without rupture: Secondary | ICD-10-CM | POA: Diagnosis present

## 2024-05-03 DIAGNOSIS — E785 Hyperlipidemia, unspecified: Secondary | ICD-10-CM | POA: Diagnosis present

## 2024-05-03 DIAGNOSIS — R0602 Shortness of breath: Secondary | ICD-10-CM | POA: Diagnosis not present

## 2024-05-03 DIAGNOSIS — Z7982 Long term (current) use of aspirin: Secondary | ICD-10-CM | POA: Diagnosis not present

## 2024-05-03 DIAGNOSIS — Z87891 Personal history of nicotine dependence: Secondary | ICD-10-CM | POA: Diagnosis not present

## 2024-05-03 DIAGNOSIS — J9601 Acute respiratory failure with hypoxia: Secondary | ICD-10-CM | POA: Diagnosis present

## 2024-05-03 DIAGNOSIS — Z953 Presence of xenogenic heart valve: Secondary | ICD-10-CM

## 2024-05-03 DIAGNOSIS — R011 Cardiac murmur, unspecified: Secondary | ICD-10-CM | POA: Diagnosis not present

## 2024-05-03 DIAGNOSIS — J90 Pleural effusion, not elsewhere classified: Secondary | ICD-10-CM | POA: Diagnosis not present

## 2024-05-03 DIAGNOSIS — Z9981 Dependence on supplemental oxygen: Secondary | ICD-10-CM

## 2024-05-03 DIAGNOSIS — J449 Chronic obstructive pulmonary disease, unspecified: Secondary | ICD-10-CM | POA: Diagnosis present

## 2024-05-03 DIAGNOSIS — K219 Gastro-esophageal reflux disease without esophagitis: Secondary | ICD-10-CM | POA: Diagnosis present

## 2024-05-03 DIAGNOSIS — J441 Chronic obstructive pulmonary disease with (acute) exacerbation: Secondary | ICD-10-CM | POA: Diagnosis not present

## 2024-05-03 DIAGNOSIS — I272 Pulmonary hypertension, unspecified: Secondary | ICD-10-CM | POA: Diagnosis present

## 2024-05-03 DIAGNOSIS — Z833 Family history of diabetes mellitus: Secondary | ICD-10-CM | POA: Diagnosis not present

## 2024-05-03 DIAGNOSIS — I2510 Atherosclerotic heart disease of native coronary artery without angina pectoris: Secondary | ICD-10-CM | POA: Diagnosis not present

## 2024-05-03 DIAGNOSIS — Z79899 Other long term (current) drug therapy: Secondary | ICD-10-CM

## 2024-05-03 DIAGNOSIS — J9621 Acute and chronic respiratory failure with hypoxia: Principal | ICD-10-CM | POA: Diagnosis present

## 2024-05-03 DIAGNOSIS — J439 Emphysema, unspecified: Secondary | ICD-10-CM | POA: Diagnosis not present

## 2024-05-03 DIAGNOSIS — J440 Chronic obstructive pulmonary disease with acute lower respiratory infection: Secondary | ICD-10-CM | POA: Diagnosis present

## 2024-05-03 DIAGNOSIS — M810 Age-related osteoporosis without current pathological fracture: Secondary | ICD-10-CM | POA: Diagnosis present

## 2024-05-03 DIAGNOSIS — R058 Other specified cough: Secondary | ICD-10-CM | POA: Diagnosis not present

## 2024-05-03 DIAGNOSIS — R5383 Other fatigue: Secondary | ICD-10-CM | POA: Diagnosis not present

## 2024-05-03 DIAGNOSIS — J181 Lobar pneumonia, unspecified organism: Secondary | ICD-10-CM | POA: Diagnosis not present

## 2024-05-03 DIAGNOSIS — J189 Pneumonia, unspecified organism: Secondary | ICD-10-CM | POA: Diagnosis not present

## 2024-05-03 DIAGNOSIS — E7800 Pure hypercholesterolemia, unspecified: Secondary | ICD-10-CM | POA: Diagnosis not present

## 2024-05-03 DIAGNOSIS — Z9049 Acquired absence of other specified parts of digestive tract: Secondary | ICD-10-CM

## 2024-05-03 DIAGNOSIS — Z96643 Presence of artificial hip joint, bilateral: Secondary | ICD-10-CM | POA: Diagnosis present

## 2024-05-03 DIAGNOSIS — R918 Other nonspecific abnormal finding of lung field: Secondary | ICD-10-CM | POA: Diagnosis not present

## 2024-05-03 DIAGNOSIS — I4891 Unspecified atrial fibrillation: Secondary | ICD-10-CM | POA: Diagnosis present

## 2024-05-03 DIAGNOSIS — Z66 Do not resuscitate: Secondary | ICD-10-CM | POA: Diagnosis not present

## 2024-05-03 DIAGNOSIS — Z952 Presence of prosthetic heart valve: Secondary | ICD-10-CM

## 2024-05-03 DIAGNOSIS — I10 Essential (primary) hypertension: Secondary | ICD-10-CM | POA: Diagnosis not present

## 2024-05-03 LAB — I-STAT VENOUS BLOOD GAS, ED
Acid-Base Excess: 6 mmol/L — ABNORMAL HIGH (ref 0.0–2.0)
Bicarbonate: 33.2 mmol/L — ABNORMAL HIGH (ref 20.0–28.0)
Calcium, Ion: 1.19 mmol/L (ref 1.15–1.40)
HCT: 40 % (ref 36.0–46.0)
Hemoglobin: 13.6 g/dL (ref 12.0–15.0)
O2 Saturation: 47 %
Patient temperature: 97.7
Potassium: 3.8 mmol/L (ref 3.5–5.1)
Sodium: 138 mmol/L (ref 135–145)
TCO2: 35 mmol/L — ABNORMAL HIGH (ref 22–32)
pCO2, Ven: 54.8 mmHg (ref 44–60)
pH, Ven: 7.388 (ref 7.25–7.43)
pO2, Ven: 26 mmHg — CL (ref 32–45)

## 2024-05-03 LAB — CBC
HCT: 40.1 % (ref 36.0–46.0)
Hemoglobin: 13.3 g/dL (ref 12.0–15.0)
MCH: 30.9 pg (ref 26.0–34.0)
MCHC: 33.2 g/dL (ref 30.0–36.0)
MCV: 93.3 fL (ref 80.0–100.0)
Platelets: 286 K/uL (ref 150–400)
RBC: 4.3 MIL/uL (ref 3.87–5.11)
RDW: 13.1 % (ref 11.5–15.5)
WBC: 11.8 K/uL — ABNORMAL HIGH (ref 4.0–10.5)
nRBC: 0 % (ref 0.0–0.2)

## 2024-05-03 LAB — PROCALCITONIN: Procalcitonin: <0.1 ng/mL

## 2024-05-03 LAB — HEPATIC FUNCTION PANEL
ALT: 19 U/L (ref 0–44)
AST: 23 U/L (ref 15–41)
Albumin: 3.9 g/dL (ref 3.5–5.0)
Alkaline Phosphatase: 59 U/L (ref 38–126)
Bilirubin, Direct: 0.2 mg/dL (ref 0.0–0.2)
Indirect Bilirubin: 0.2 mg/dL — ABNORMAL LOW (ref 0.3–0.9)
Total Bilirubin: 0.4 mg/dL (ref 0.0–1.2)
Total Protein: 7.1 g/dL (ref 6.5–8.1)

## 2024-05-03 LAB — BASIC METABOLIC PANEL WITH GFR
Anion gap: 11 (ref 5–15)
BUN: 16 mg/dL (ref 8–23)
CO2: 29 mmol/L (ref 22–32)
Calcium: 9.4 mg/dL (ref 8.9–10.3)
Chloride: 99 mmol/L (ref 98–111)
Creatinine, Ser: 0.74 mg/dL (ref 0.44–1.00)
GFR, Estimated: >60 mL/min (ref >60)
Glucose, Bld: 97 mg/dL (ref 70–99)
Potassium: 3.8 mmol/L (ref 3.5–5.1)
Sodium: 139 mmol/L (ref 135–145)

## 2024-05-03 LAB — RESP PANEL BY RT-PCR (RSV, FLU A&B, COVID)  RVPGX2
Influenza A by PCR: NEGATIVE
Influenza B by PCR: NEGATIVE
Resp Syncytial Virus by PCR: NEGATIVE
SARS Coronavirus 2 by RT PCR: NEGATIVE

## 2024-05-03 LAB — LIPASE, BLOOD: Lipase: 32 U/L (ref 11–51)

## 2024-05-03 MED ORDER — ONDANSETRON HCL 4 MG/2ML IJ SOLN
4.0000 mg | Freq: Four times a day (QID) | INTRAMUSCULAR | Status: DC | PRN
  Administered 2024-05-04: 4 mg via INTRAVENOUS
  Filled 2024-05-03: qty 2

## 2024-05-03 MED ORDER — ASPIRIN 81 MG PO TBEC
81.0000 mg | DELAYED_RELEASE_TABLET | Freq: Every day | ORAL | Status: DC
  Administered 2024-05-04 – 2024-05-05 (×2): 81 mg via ORAL
  Filled 2024-05-03 (×2): qty 1

## 2024-05-03 MED ORDER — ACETAMINOPHEN 650 MG RE SUPP
650.0000 mg | Freq: Four times a day (QID) | RECTAL | Status: DC | PRN
Start: 2024-05-03 — End: 2024-05-05

## 2024-05-03 MED ORDER — HYDROCODONE-ACETAMINOPHEN 5-325 MG PO TABS: 1.0000 | ORAL_TABLET | ORAL | Status: DC | PRN

## 2024-05-03 MED ORDER — SODIUM CHLORIDE 0.9 % IV SOLN
2.0000 g | Freq: Once | INTRAVENOUS | Status: AC
  Administered 2024-05-03: 2 g via INTRAVENOUS
  Filled 2024-05-03: qty 12.5

## 2024-05-03 MED ORDER — ONDANSETRON HCL 4 MG PO TABS: 4.0000 mg | ORAL_TABLET | Freq: Four times a day (QID) | ORAL | Status: DC | PRN

## 2024-05-03 MED ORDER — SODIUM CHLORIDE 0.9 % IV SOLN
2.0000 g | INTRAVENOUS | Status: DC
  Administered 2024-05-03 – 2024-05-05 (×3): 2 g via INTRAVENOUS
  Filled 2024-05-03 (×3): qty 20

## 2024-05-03 MED ORDER — METOPROLOL SUCCINATE ER 25 MG PO TB24
25.0000 mg | ORAL_TABLET | Freq: Every day | ORAL | Status: DC
  Administered 2024-05-04 – 2024-05-05 (×2): 25 mg via ORAL
  Filled 2024-05-03 (×2): qty 1

## 2024-05-03 MED ORDER — MAGNESIUM OXIDE -MG SUPPLEMENT 400 (240 MG) MG PO TABS: 400.0000 mg | ORAL_TABLET | Freq: Every day | ORAL | Status: DC

## 2024-05-03 MED ORDER — ACETAMINOPHEN 325 MG PO TABS: 650.0000 mg | ORAL_TABLET | Freq: Four times a day (QID) | ORAL | Status: DC | PRN

## 2024-05-03 MED ORDER — VANCOMYCIN HCL IN DEXTROSE 1-5 GM/200ML-% IV SOLN
1000.0000 mg | Freq: Once | INTRAVENOUS | Status: AC
  Administered 2024-05-03: 1000 mg via INTRAVENOUS
  Filled 2024-05-03: qty 200

## 2024-05-03 MED ORDER — IOHEXOL 350 MG/ML SOLN
75.0000 mL | Freq: Once | INTRAVENOUS | Status: AC | PRN
Start: 2024-05-03 — End: 2024-05-03
  Administered 2024-05-03: 75 mL via INTRAVENOUS

## 2024-05-03 MED ORDER — METHYLPREDNISOLONE SODIUM SUCC 40 MG IJ SOLR
40.0000 mg | Freq: Two times a day (BID) | INTRAMUSCULAR | Status: AC
  Administered 2024-05-04 (×2): 40 mg via INTRAVENOUS
  Filled 2024-05-03 (×2): qty 1

## 2024-05-03 MED ORDER — IPRATROPIUM-ALBUTEROL 0.5-2.5 (3) MG/3ML IN SOLN
3.0000 mL | Freq: Once | RESPIRATORY_TRACT | Status: AC
  Administered 2024-05-03: 3 mL via RESPIRATORY_TRACT

## 2024-05-03 MED ORDER — GUAIFENESIN ER 600 MG PO TB12
600.0000 mg | ORAL_TABLET | Freq: Two times a day (BID) | ORAL | Status: DC
  Administered 2024-05-03 – 2024-05-05 (×4): 600 mg via ORAL
  Filled 2024-05-03 (×4): qty 1

## 2024-05-03 MED ORDER — SODIUM CHLORIDE 0.9 % IV SOLN: INTRAVENOUS | Status: AC | PRN

## 2024-05-03 MED ORDER — SODIUM CHLORIDE 0.9 % IV SOLN
500.0000 mg | INTRAVENOUS | Status: DC
  Administered 2024-05-04 – 2024-05-05 (×3): 500 mg via INTRAVENOUS
  Filled 2024-05-03 (×3): qty 5

## 2024-05-03 MED ORDER — LORATADINE 10 MG PO TABS: 10.0000 mg | ORAL_TABLET | Freq: Every day | ORAL | Status: DC | PRN

## 2024-05-03 MED ORDER — SODIUM CHLORIDE 0.9 % IV SOLN: INTRAVENOUS | Status: AC

## 2024-05-03 MED ORDER — IPRATROPIUM-ALBUTEROL 0.5-2.5 (3) MG/3ML IN SOLN
3.0000 mL | Freq: Four times a day (QID) | RESPIRATORY_TRACT | Status: DC
  Administered 2024-05-03 – 2024-05-04 (×3): 3 mL via RESPIRATORY_TRACT
  Filled 2024-05-03 (×3): qty 3

## 2024-05-03 MED ORDER — ROSUVASTATIN CALCIUM 10 MG PO TABS: 40.0000 mg | ORAL_TABLET | Freq: Every day | ORAL | Status: DC

## 2024-05-03 MED ORDER — PREDNISONE 20 MG PO TABS
40.0000 mg | ORAL_TABLET | Freq: Every day | ORAL | Status: DC
  Administered 2024-05-05: 40 mg via ORAL
  Filled 2024-05-03: qty 2

## 2024-05-03 MED ORDER — AMLODIPINE BESYLATE 5 MG PO TABS: 5.0000 mg | ORAL_TABLET | Freq: Every day | ORAL | Status: DC

## 2024-05-03 NOTE — Progress Notes (Addendum)
 Pt admitted via transport, alert and oriented x4, 2L [place], no acute distress. Paged admitting provider via amion. Waiting response.

## 2024-05-03 NOTE — ED Provider Notes (Signed)
 [place] [facility] Provider Note   CSN: [id number] Arrival date & time: 05/03/24  1051     Patient presents with: Shortness of Breath   Dominique [name] is a 83 y.o. female who presents emergency department with chief complaint of productive cough and increasing shortness of breath.  She has a past medical history of COPD.  She is on oxygen  at night.  She has been feeling unwell for the past week.  Her daughter-in-law is at bedside states that every time she feels bad she is nauseated.  She has had a couple episodes of both posttussive vomiting and vomiting from nausea.  She denies abdominal pain.  Patient has been using her prescribed preventative COPD medications but has not been using albuterol  for cough relief.  She has productive phlegm but her cough is not painful.  She describes the phlegm as green in coloration.  Patient has noticed that her oxygen  saturation went down and has now been on oxygen  all the time to bring her O2 saturations above 90%.  She denies fevers chills or weight loss.    Shortness of Breath      Prior to Admission medications   Medication Sig Start Date End Date Taking? Authorizing Provider  acetaminophen  (TYLENOL ) 325 MG tablet Take 2 tablets (650 mg total) by mouth every 6 (six) hours as needed for mild pain (or Fever >/= 101). 04/29/19   Vicci Burnard SAUNDERS, PA-C  albuterol  (VENTOLIN  HFA) 108 (90 Base) MCG/ACT inhaler Inhale 2 puffs into the lungs every 6 (six) hours as needed for wheezing or shortness of breath. Patient not taking: Reported on 04/21/2024 07/09/22   Ruthell Lauraine FALCON, NP  amLODipine  (NORVASC ) 5 MG tablet Take 1 tablet (5 mg total) by mouth daily. 01/10/24   Paz, Jose E, MD  amoxicillin  (AMOXIL ) 500 MG tablet Take 4 tablets (2,000mg ) one hour prior to all dental visits. Patient not taking: Reported on 04/21/2024 01/07/23   Raford Riggs, MD  aspirin  EC 81 MG tablet Take 81 mg by mouth daily.    [provider]  budesonide -glycopyrrolate-formoterol  (BREZTRI  AEROSPHERE) 160-9-4.8 MCG/ACT AERO inhaler Inhale 2 puffs into the lungs in the morning and at bedtime. 04/20/24   Ruthell Lauraine FALCON, NP  budesonide -glycopyrrolate-formoterol  (BREZTRI  AEROSPHERE) 160-9-4.8 MCG/ACT AERO inhaler Inhale 2 puffs into the lungs in the morning and at bedtime. 04/20/24   Ruthell Lauraine FALCON, NP  Calcium  Carb-Cholecalciferol (CALCIUM  600 + D PO) Take 600 mg by mouth 3 (three) times a week.     [provider]  Cholecalciferol (VITAMIN D3) 5000 UNITS CAPS Take 5,000 Units by mouth daily.     [provider]  denosumab  (PROLIA ) 60 MG/ML SOLN injection Inject 60 mg into the skin every 6 (six) months. Administer in upper arm, thigh, or abdomen    [provider]  EPINEPHrine  0.3 mg/0.3 mL IJ SOAJ injection Inject 0.3 mg into the muscle as needed for anaphylaxis. Patient not taking: Reported on 04/21/2024 12/01/17   [provider]  fluticasone  (FLONASE ) 50 MCG/ACT nasal spray Place 2 sprays into both nostrils daily. 02/22/24   [provider]  furosemide  (LASIX ) 20 MG tablet DAILY AS NEEDED FOR SWELLING Patient not taking: Reported on 04/21/2024 09/02/20   Raford Riggs, MD  hydrochlorothiazide  (MICROZIDE ) 12.5 MG capsule TAKE 1 CAPSULE BY MOUTH EVERY DAY 03/20/24   Raford Riggs, MD  loratadine  (CLARITIN ) 10 MG tablet Take 10 mg by mouth daily as needed for allergies.  [provider]  Magnesium  200 MG TABS 2 tablets with a meal Orally Once a day    [provider]  magnesium  oxide (MAG-OX) 400 (240 Mg) MG tablet TAKE 1 TABLET BY MOUTH EVERY DAY 10/01/23   Walker, Caitlin S, NP  metoprolol  succinate (TOPROL -XL) 25 MG 24 hr tablet TAKE 1 TABLET BY MOUTH EVERY DAY 11/25/23   Raford Riggs, MD  Multiple Vitamin (MULTIVITAMIN WITH MINERALS) TABS tablet Take 1 tablet by mouth daily.    [provider]  OXYGEN  Inhale 2 L into the lungs continuous.     [provider]  Allie Gutta Calcium  500 MG TABS 1 tablet with meals Orally Twice a day    [provider]  rosuvastatin  (CRESTOR ) 40 MG tablet TAKE 1 TABLET BY MOUTH EVERY DAY 12/19/23   Raford Riggs, MD    Allergies: Patient has no known allergies.    Review of Systems  Respiratory:  Positive for shortness of breath.     Updated Vital Signs BP (!) 147/79 (BP Location: Right Arm)   Pulse 77   Temp 97.7 F (36.5 C) (Oral)   Resp (!) 22   Ht 5' 6 (1.676 m)   Wt 74.3 kg   SpO2 96%   BMI 26.44 kg/m   Physical Exam Vitals and nursing note reviewed.  Constitutional:      General: She is not in acute distress.    Appearance: She is well-developed. She is not diaphoretic.     Interventions: Nasal cannula in place.  HENT:     Head: Normocephalic and atraumatic.     Right Ear: External ear normal.     Left Ear: External ear normal.     Nose: Nose normal.     Mouth/Throat:     Mouth: Mucous membranes are moist.  Eyes:     General: No scleral icterus.    Extraocular Movements: Extraocular movements intact.     Conjunctiva/sclera: Conjunctivae normal.     Pupils: Pupils are equal, round, and reactive to light.  Cardiovascular:     Rate and Rhythm: Normal rate and regular rhythm.     Heart sounds: Normal heart sounds. No murmur heard.    No friction rub. No gallop.  Pulmonary:     Effort: Pulmonary effort is normal. No respiratory distress.     Breath sounds: Rhonchi present.  Abdominal:     General: Bowel sounds are normal. There is no distension.     Palpations: Abdomen is soft. There is no mass.     Tenderness: There is no abdominal tenderness. There is no guarding.  Musculoskeletal:     Cervical back: Normal range of motion.     Right lower leg: No edema.     Left lower leg: No edema.  Skin:    General: Skin is warm and dry.  Neurological:     Mental Status: She is alert and oriented to person, place, and time.  Psychiatric:        Behavior:  Behavior normal.     (all labs ordered are listed, but only abnormal results are displayed) Labs Reviewed  RESP PANEL BY RT-PCR (RSV, FLU A&B, COVID)  [id number]  BASIC METABOLIC PANEL WITH GFR  CBC  LIPASE, BLOOD  HEPATIC FUNCTION PANEL  I-STAT VENOUS BLOOD GAS, ED    EKG: EKG Interpretation Date/Time:  Sunday May 03 2024 11:11:17 EDT Ventricular Rate:  72 PR Interval:  162 QRS Duration:  90 QT Interval:  386 QTC Calculation: 423  R Axis:   47  Text Interpretation: Sinus rhythm Confirmed by Neysa Clap [phone]) on 05/03/2024 11:16:03 AM  Radiology: [facility] Chest Port 1 View Result Date: 05/03/2024 CLINICAL DATA:  Shortness of breath, fatigue and cough productive of green sputum. EXAM: PORTABLE CHEST 1 VIEW COMPARISON:  06/08/2019 FINDINGS: Heart size and mediastinal contours are normal. Status post TAVR. Prominent bilateral pulmonary arteries. No pleural fluid: Interstitial edema, or airspace consolidation. Visualized osseous structures are unremarkable. IMPRESSION: 1. No acute cardiopulmonary abnormalities. 2. Prominent bilateral pulmonary arteries. Correlate for any clinical signs or symptoms of pulmonary arterial hypertension. Electronically Signed   By: Waddell Calk M.D.   On: 05/03/2024 11:29     .Critical Care  Performed by: Arloa Chroman, PA-C Authorized by: Arloa Chroman, PA-C   Critical care provider statement:    Critical care time (minutes):  30   Critical care time was exclusive of:  Separately billable procedures and treating other patients   Critical care was necessary to treat or prevent imminent or life-threatening deterioration of the following conditions:  Respiratory failure   Critical care was time spent personally by me on the following activities:  Development of treatment plan with patient or surrogate, discussions with consultants, evaluation of patient's response to treatment, examination of patient, ordering and review of laboratory studies, ordering and  review of radiographic studies, ordering and performing treatments and interventions, pulse oximetry, re-evaluation of patient's condition and review of old charts   Care discussed with: admitting provider      Medications Ordered in the ED - No data to display                                  Medical Decision Making This patient presents to the ED for concern of sob, this involves an extensive number of treatment options, and is a complaint that carries with it a high risk of complications and morbidity.  The emergent differential diagnosis for shortness of breath includes, but is not limited to, Pulmonary edema, bronchoconstriction, Pneumonia, Pulmonary embolism, Pneumotherax/ Hemothorax, Dysrythmia, ACS.     Co morbidities:        has a past medical history of Allergy , Atrial fibrillation (HCC), COPD (chronic obstructive pulmonary disease) (HCC), Critical lower limb ischemia (HCC), Emphysema lung (HCC), Emphysema of lung (HCC), GERD (gastroesophageal reflux disease), Heart murmur, Hyperlipidemia, Hypertension, Hypomagnesemia, Normal coronary arteries (10/2018), NSVT (nonsustained ventricular tachycardia) (HCC) (03/02/2019), Osteoporosis, Oxygen  deficiency, Pulmonary HTN (HCC) (06/05/2016), Pulmonary nodules, S/P TAVR (transcatheter aortic valve replacement) (12/30/2018), Sacral fracture (HCC), Severe aortic stenosis, Thoracic ascending aortic aneurysm (HCC), and Thyroid  nodule (11/08/2022).   Social Determinants of Health:        SDOH Screenings Food Insecurity: No Food Insecurity (05/03/2024) Housing: Low Risk  (05/03/2024) Transportation Needs: No Transportation Needs (05/03/2024) Utilities: Not At Risk (05/03/2024) Alcohol Screen: Low Risk  (12/02/2023) Depression (PHQ2-9): Low Risk  (04/07/2024) Financial Resource Strain: Low Risk  (12/02/2023) Physical Activity: Insufficiently Active (12/02/2023) Social Connections: Moderately Isolated (05/03/2024) Stress: No Stress Concern Present  (12/02/2023) Tobacco Use: Medium Risk (05/03/2024) Health Literacy: Adequate Health Literacy (08/28/2023)   Additional history:  {Additional history obtained from family   Lab Tests:  I Ordered, and personally interpreted labs.  The pertinent results include:      Imaging Studies:  I ordered imaging studies including cxr, CTA chest I independently visualized and interpreted imaging which showed negative xray, ct showed chronic nodules, and likely acute consolidation  I agree with the radiologist interpretation  Cardiac Monitoring/ECG:       The patient was maintained on a cardiac monitor.  I personally viewed and interpreted the cardiac monitored which showed an underlying rhythm of: nsr  Medicines ordered and prescription drug management:  I ordered medication including Medications 0.9 %  sodium chloride  infusion ( Intravenous New Bag/Given 05/03/24 1533) 0.9 %  sodium chloride  infusion (0 mLs Intravenous Stopped 05/04/24 1449) ipratropium-albuterol  (DUONEB) 0.5-2.5 (3) MG/3ML nebulizer solution 3 mL (3 mLs Nebulization Given 05/03/24 1344) iohexol  (OMNIPAQUE ) 350 MG/ML injection 75 mL (75 mLs Intravenous Contrast Given 05/03/24 1437) vancomycin  (VANCOCIN ) IVPB 1000 mg/200 mL premix (1,000 mg Intravenous New Bag/Given 05/03/24 1615) ceFEPIme  (MAXIPIME ) 2 g in sodium chloride  0.9 % 100 mL IVPB (0 g Intravenous Stopped 05/03/24 1606) methylPREDNISolone  sodium succinate (SOLU-MEDROL ) 40 mg/mL injection 40 mg (40 mg Intravenous Given 05/04/24 1702) magnesium  sulfate IVPB 2 g 50 mL (2 g Intravenous New Bag/Given 05/04/24 9077) for sob Reevaluation of the patient after these medicines showed that the patient improved I have reviewed the patients home medicines and have made adjustments as needed  Test Considered:         Critical Interventions:         Consultations Obtained: TR[facility] Dr. Joseph for admission  Problem List / ED Course:       (J96.21) Acute on chronic respiratory failure  with hypoxia (HCC)  (primary encounter diagnosis)  (J18.9) Pneumonia of right lower lobe due to infectious organism   MDM: patient with acute on chronic resp failure likely due to CAP.   Dispostion:  After consideration of the diagnostic results and the patients response to treatment, I feel that the patent would benefit from admission.    Amount and/or Complexity of Data Reviewed Labs: ordered. Radiology: ordered.  Risk Prescription drug management. Decision regarding hospitalization.        Final diagnoses:  None    ED Discharge Orders     None          Arloa Chroman, PA-C 05/05/24 2152    Neysa Caron PARAS, DO 05/20/24 [phone]

## 2024-05-03 NOTE — Assessment & Plan Note (Signed)
 Chronic stable continue aspirin  81 mg daily

## 2024-05-03 NOTE — ED Notes (Signed)
 Difficult stick , unable to obtain IC access , another staff member will attempt US  IV .

## 2024-05-03 NOTE — H&P (Signed)
 Dominique [name]:[id number] DOB: [birth year] DOA: 05/03/2024     PCP: Amon Aloysius BRAVO, MD   Outpatient Specialists:  CARDS:   Dr. Annabella Scarce, MD    NEurology     Dr. Rosemarie Pulmonary Ruthell Lauraine FALCON, NP   Patient arrived to ER on 05/03/24 at 1051 Referred by Attending Silvester Ales, MD   Patient coming from:    home Lives alone,    Chief Complaint:   Chief Complaint  Patient presents with   Shortness of Breath    HPI: Dominique [name] is a 83 y.o. female with medical history significant of COPD,  TAVR  hypertension moderate pulmonary hypertension. PAD, GERD  Presented with   1 wk cough 1 week history of shortness of breath fatigue cough productive of green sputum.   Patient history of COPD Uses oxygen  usually at night only and as needed during a day found to be 89% on room air started on 2 L Family states that she have had some vomiting after excessive coughing try to use albuterol  but does not seem to help no recent fevers or chills     Have had more wheezing   Denies significant ETOH intake   Does not smoke   Regarding pertinent Chronic problems:     Hyperlipidemia - on statins crestor  Lipid Panel     Component Value Date/Time   CHOL 161 11/28/2023 0904   TRIG 59 11/28/2023 0904   HDL 79 11/28/2023 0904   CHOLHDL 2.0 11/28/2023 0904   CHOLHDL 2 10/17/2022 1113   VLDL 15.8 10/17/2022 1113   LDLCALC 70 11/28/2023 0904   LABVLDL 12 11/28/2023 0904     HTN on Norvasc , Toprol     last echo  Recent Results (from the past 56199 hours)  ECHOCARDIOGRAM COMPLETE   Collection Time: 04/16/24 10:50 AM  Result Value   Area-P 1/2 2.87   S' Lateral 3.18   AR max vel 1.15   AV Area VTI 1.19   AV Area mean vel 1.13   MV M vel 1.96   MV Peak grad 15.4   AV Mean grad 9.0   AV Peak grad 17.0   Ao pk vel 2.06   Est EF 55 - 60%   Narrative      ECHOCARDIOGRAM REPORT      IMPRESSIONS    1. Left ventricular ejection fraction, by estimation, is 55 to 60%.  The left ventricle has normal function. The left ventricle has no regional wall motion abnormalities. Left ventricular diastolic parameters were normal.  2. Right ventricular systolic function is normal. The right ventricular size is normal.  3. The mitral valve is normal in structure. Mild mitral valve regurgitation.  4. S/p TAVR(Medtronic Core Valve Evolue Pro, 26 mm), procedure date 12/30/18)     Peak and mean gradeitns through the valve are 17 and 9 mm Hg respectively AVA (VTI) is 1.2 cm2. DVI is 0.47 Very mild perivalvular regurgitation.     Compared to echo from 2023, mean gradient is mildly increased (4 to 9 mm Hg) . The aortic valve has been repaired/replaced. Aortic valve regurgitation is mild.  5. The inferior vena cava is normal in size with greater than 50% respiratory variability, suggesting right atrial pressure of 3 mmHg.         PAD  - On Aspirin , statin,       COPD -  followed by pulmonology on baseline oxygen   2L,  at night   Hx of TIA -  on Aspirin  81 mg       While in ER:    Initial chest x-ray unremarkable but CT angio showed no PE although did show infiltrates Started on cefepime  and Vanco    Lab Orders         Resp panel by RT-PCR (RSV, Flu A&B, Covid) Anterior Nasal Swab         Basic metabolic panel         CBC         Lipase, blood         Hepatic function panel         I-Stat venous blood gas, ([facility], [facility], [facility])       CXR -  NON acute pulmonary arterial hypertension.     CTA chest -   no PE,Mild airspace disease in the right lower lobe, possible atelectasis or infiltrate.  Following Medications were ordered in ER: Medications  0.9 %  sodium chloride  infusion ( Intravenous New Bag/Given 05/03/24 1533)  ipratropium-albuterol  (DUONEB) 0.5-2.5 (3) MG/3ML nebulizer solution 3 mL (3 mLs Nebulization Given 05/03/24 1344)  iohexol  (OMNIPAQUE ) 350 MG/ML injection 75 mL (75 mLs Intravenous Contrast Given 05/03/24 1437)  vancomycin  (VANCOCIN ) IVPB 1000 mg/200 mL  premix (1,000 mg Intravenous New Bag/Given 05/03/24 1615)  ceFEPIme  (MAXIPIME ) 2 g in sodium chloride  0.9 % 100 mL IVPB (0 g Intravenous Stopped 05/03/24 1606)    _________    ED Triage Vitals  Encounter Vitals Group     BP 05/03/24 1107 (!) 147/79     Girls Systolic BP Percentile --      Girls Diastolic BP Percentile --      Boys Systolic BP Percentile --      Boys Diastolic BP Percentile --      Pulse Rate 05/03/24 1107 77     Resp 05/03/24 1107 (!) 22     Temp 05/03/24 1107 97.7 F (36.5 C)     Temp Source 05/03/24 1107 Oral     SpO2 05/03/24 1103 (!) 88 %     Weight 05/03/24 1103 163 lb 12.8 oz (74.3 kg)     Height 05/03/24 1103 5' 6 (1.676 m)     Head Circumference --      Peak Flow --      Pain Score 05/03/24 1103 0     Pain Loc --      Pain Education --      Exclude from Growth Chart --   UFJK(75)@     _________________________________________ Significant initial  Findings: Abnormal Labs Reviewed  CBC - Abnormal; Notable for the following components:      Result Value   WBC 11.8 (*)    All other components within normal limits  HEPATIC FUNCTION PANEL - Abnormal; Notable for the following components:   Indirect Bilirubin 0.2 (*)    All other components within normal limits  I-STAT VENOUS BLOOD GAS, ED - Abnormal; Notable for the following components:   pO2, Ven 26 (*)    Bicarbonate 33.2 (*)    TCO2 35 (*)    Acid-Base Excess 6.0 (*)    All other components within normal limits   _________  Cardiac Panel (last 3 results) No results for input(s): CKTOTAL, CKMB, TROPONINIHS, RELINDX in the last 72 hours.   ECG: Ordered Personally reviewed and interpreted by me showing: HR : 72 Rhythm: NSR,    , no evidence of ischemic changes QTC 423    The recent clinical data is shown  below. Vitals:   05/03/24 1414 05/03/24 1535 05/03/24 1545 05/03/24 1828  BP: 139/60  134/70 (!) 133/59  Pulse: 74  76 67  Resp: 16   16  Temp:  97.7 F (36.5 C)  98 F (36.7 C)   TempSrc:  Oral  Oral  SpO2: 97%  97% 97%  Weight:      Height:        WBC     Component Value Date/Time   WBC 11.8 (H) 05/03/2024 1209   LYMPHSABS 1.6 08/06/2023 1100   LYMPHSABS 1.4 11/13/2018 1310   MONOABS 0.8 08/06/2023 1100   EOSABS 288 04/07/2024 1148   EOSABS 0.5 (H) 11/13/2018 1310   BASOSABS 58 04/07/2024 1148   BASOSABS 0.1 11/13/2018 1310     Procalcitonin   Ordered     Results for orders placed or performed during the hospital encounter of 05/03/24  Resp panel by RT-PCR (RSV, Flu A&B, Covid) Anterior Nasal Swab     Status: None   Collection Time: 05/03/24 11:15 AM   Specimen: Anterior Nasal Swab  Result Value Ref Range Status   SARS Coronavirus 2 by RT PCR NEGATIVE NEGATIVE Final         Influenza A by PCR NEGATIVE NEGATIVE Final   Influenza B by PCR NEGATIVE NEGATIVE Final         Resp Syncytial Virus by PCR NEGATIVE NEGATIVE Final          ABX started Antibiotics Given (last 72 hours)     Date/Time Action Medication Dose Rate   05/03/24 1534 New Bag/Given   ceFEPIme  (MAXIPIME ) 2 g in sodium chloride  0.9 % 100 mL IVPB 2 g 200 mL/hr   05/03/24 1615 New Bag/Given   vancomycin  (VANCOCIN ) IVPB 1000 mg/200 mL premix 1,000 mg 200 mL/hr       ________________________________________________________________    Venous  Blood Gas result:  pH   7.388 Calcium , Ion 1.19 mmol/L   pCO2, Ven 54.8 mmHg HCT 40.0 %  pO2, Ven 26 Low Panic  mmHg      __________________________________________________________ Recent Labs  Lab 05/03/24 1209 05/03/24 1221  NA 139 138  K 3.8 3.8  CO2 29  --   GLUCOSE 97  --   BUN 16  --   CREATININE 0.74  --   CALCIUM  9.4  --     Cr   stable,    Lab Results  Component Value Date   CREATININE 0.74 05/03/2024   CREATININE 0.86 04/07/2024   CREATININE 0.88 11/28/2023    Recent Labs  Lab 05/03/24 1209  AST 23  ALT 19  ALKPHOS 59  BILITOT 0.4  PROT 7.1  ALBUMIN 3.9   Lab Results  Component Value Date   CALCIUM   9.4 05/03/2024   PHOS 3.5 12/27/2014    Plt: Lab Results  Component Value Date   PLT 286 05/03/2024       Recent Labs  Lab 05/03/24 1209 05/03/24 1221  WBC 11.8*  --   HGB 13.3 13.6  HCT 40.1 40.0  MCV 93.3  --   PLT 286  --     HG/HCT stable,      Component Value Date/Time   HGB 13.6 05/03/2024 1221   HGB 14.9 07/21/2020 1107   HCT 40.0 05/03/2024 1221   HCT 45.9 07/21/2020 1107   MCV 93.3 05/03/2024 1209   MCV 95 07/21/2020 1107     Recent Labs  Lab 05/03/24 1209  LIPASE 32   No results  for input(s): AMMONIA in the last 168 hours.    _______________________________________________ Hospitalist was called for admission for   Acute on chronic respiratory failure with hypoxia    Pneumonia     The following Work up has been ordered so far:  Orders Placed This Encounter  Procedures   Resp panel by RT-PCR (RSV, Flu A&B, Covid) Anterior Nasal Swab   DG Chest Port 1 View   CT Angio Chest PE W and/or Wo Contrast   Basic metabolic panel   CBC   Lipase, blood   Hepatic function panel   Diet Heart Room service appropriate? Yes; Fluid consistency: Thin   Document Height and Actual Weight   If O2 Sat <94% administer O2 at 2 liters/minute via nasal cannula   ED Cardiac monitoring   Cardiac Monitoring Continuous x 48 hours Indications for use: Other; Other indications for use: High risk for decompensation/close monitoring   Consult to hospitalist   I-Stat venous blood gas, ([facility], [facility], [facility])   ED EKG   EKG 12-Lead   EKG   EKG   Place in observation (patient's expected length of stay will be less than 2 midnights)     OTHER Significant initial  Findings:  labs showing:     DM  labs:  HbA1C: Recent Labs    08/06/23 1100 04/07/24 1148  HGBA1C 5.9 5.9*       CBG (last 3)  No results for input(s): GLUCAP in the last 72 hours.        Cultures:    Component Value Date/Time   SDES URINE, CLEAN CATCH 12/19/2014 1928   SPECREQUEST NONE 12/19/2014  1928   REPTSTATUS 12/20/2014 FINAL 12/19/2014 1928     Radiological Exams on Admission: CT Angio Chest PE W and/or Wo Contrast Result Date: 05/03/2024 CLINICAL DATA:  Pulmonary embolism suspected, high probability. Shortness of breath, fatigue, and productive cough. EXAM: CT ANGIOGRAPHY CHEST WITH CONTRAST TECHNIQUE: Multidetector CT imaging of the chest was performed using the standard protocol during bolus administration of intravenous contrast. Multiplanar CT image reconstructions and MIPs were obtained to evaluate the vascular anatomy. RADIATION DOSE REDUCTION: This exam was performed according to the departmental dose-optimization program which includes automated exposure control, adjustment of the mA and/or kV according to patient size and/or use of iterative reconstruction technique. CONTRAST:  75mL OMNIPAQUE  IOHEXOL  350 MG/ML SOLN COMPARISON:  02/25/2024. FINDINGS: Cardiovascular: The heart is normal in size and there is no pericardial effusion. Scattered coronary artery calcifications are noted. A TAVR stent is in place. There is atherosclerotic calcification of the aorta without evidence of aneurysm. There is distension of the pulmonary arteries bilaterally which may be associated with underlying pulmonary artery hypertension. There is no evidence of pulmonary embolism. Mediastinum/Nodes: No mediastinal or axillary lymphadenopathy. Prominent lymph nodes are present at the right hilum measuring up to 1.1 cm. The thyroid  gland, trachea, and esophagus are within normal limits. There is a small hiatal hernia. Lungs/Pleura: Paraseptal and centrilobular emphysematous changes are present in the lungs. There is a trace right pleural effusion. Mild airspace disease is noted in the right lower lobe. No pneumothorax is seen. Stable consolidation is noted in the posterior segment of the left upper lobe. There is a nodular opacity in the left upper lobe measuring 5 mm, axial image 39. A stable 1.3 cm ground-glass  nodule is noted in the right upper lobe, axial image 32. A stable 1.5 cm ground-glass nodule is noted in the right upper lobe, axial image 46 there  is a 5 mm nodule in the right upper lobe, axial image 66, new from the previous exam. Ground-glass nodule is noted in the right upper lobe, axial image 46. There is a stable 4 mm nodule in the right middle lobe, axial image 90. Upper Abdomen: A stable hypodensities are noted in the kidneys, previously characterized as cysts. No acute abnormality. Musculoskeletal: No acute osseous abnormality. Review of the MIP images confirms the above findings. IMPRESSION: 1. No evidence of pulmonary embolism. 2. Mild airspace disease in the right lower lobe, possible atelectasis or infiltrate. 3. Trace right pleural effusion. 4. Scattered ground-glass opacities and pulmonary nodules bilaterally, concerning for indolent adenocarcinoma. 5. Emphysema. 6. Coronary artery calcifications. 7. Aortic atherosclerosis. Electronically Signed   By: Leita Birmingham M.D.   On: 05/03/2024 15:07   DG Chest Port 1 View Result Date: 05/03/2024 CLINICAL DATA:  Shortness of breath, fatigue and cough productive of green sputum. EXAM: PORTABLE CHEST 1 VIEW COMPARISON:  06/08/2019 FINDINGS: Heart size and mediastinal contours are normal. Status post TAVR. Prominent bilateral pulmonary arteries. No pleural fluid: Interstitial edema, or airspace consolidation. Visualized osseous structures are unremarkable. IMPRESSION: 1. No acute cardiopulmonary abnormalities. 2. Prominent bilateral pulmonary arteries. Correlate for any clinical signs or symptoms of pulmonary arterial hypertension. Electronically Signed   By: Birmingham Calk M.D.   On: 05/03/2024 11:29   _______________________________________________________________________________________________________ Latest  Blood pressure (!) 133/59, pulse 67, temperature 98 F (36.7 C), temperature source Oral, resp. rate 16, height 5' 6 (1.676 m), weight 74.3 kg,  SpO2 97%.   Vitals  labs and radiology finding personally reviewed  Review of Systems:    Pertinent positives include:  fatigue,  shortness of breath at rest.productive cough, Constitutional:  No weight loss, night sweats, Fevers, chills, weight loss  HEENT:  No headaches, Difficulty swallowing,Tooth/dental problems,Sore throat,  No sneezing, itching, ear ache, nasal congestion, post nasal drip,  Cardio-vascular:  No chest pain, Orthopnea, PND, anasarca, dizziness, palpitations.no Bilateral lower extremity swelling  GI:  No heartburn, indigestion, abdominal pain, nausea, vomiting, diarrhea, change in bowel habits, loss of appetite, melena, blood in stool, hematemesis Resp:  no  No dyspnea on exertion, No excess mucus, no  No non-productive cough, No coughing up of blood.No change in color of mucus.No wheezing. Skin:  no rash or lesions. No jaundice GU:  no dysuria, change in color of urine, no urgency or frequency. No straining to urinate.  No flank pain.  Musculoskeletal:  No joint pain or no joint swelling. No decreased range of motion. No back pain.  Psych:  No change in mood or affect. No depression or anxiety. No memory loss.  Neuro: no localizing neurological complaints, no tingling, no weakness, no double vision, no gait abnormality, no slurred speech, no confusion  All systems reviewed and apart from HOPI all are negative _______________________________________________________________________________________________ Past Medical History:   Past Medical History:  Diagnosis Date   Allergy     Atrial fibrillation (HCC)    a. dx 03/2019 while in hospital with cholecystitis   COPD (chronic obstructive pulmonary disease) (HCC)    Critical lower limb ischemia (HCC)    a. after TAVR developed ischemic L leg likely due to flap/closure, s/p emergent repair of left femoral artery with endarterectomy and Dacron patch angioplasty.   Emphysema lung (HCC)    Emphysema of lung (HCC)     GERD (gastroesophageal reflux disease)    Heart murmur    Hyperlipidemia    LDL goal < 70   Hypertension  Hypomagnesemia    Normal coronary arteries 10/2018   NSVT (nonsustained ventricular tachycardia) (HCC) 03/02/2019   Osteoporosis    Oxygen  deficiency    Pulmonary HTN (HCC) 06/05/2016   Moderate with PASP by echo 04/2016 likely Group 3 from COPD and possibly Group 2 from pulmonary venous HTN associated with moderate AS - f/u echo 12/2018 post TAVR showed normal RVSP   Pulmonary nodules    seen on pre TAVR CT, needs 12 month CT follow up   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 s/p TAVR 12/2018.   Thoracic ascending aortic aneurysm (HCC)    needs yearly follow up   Thyroid  nodule 11/08/2022      Past Surgical History:  Procedure Laterality Date   APPENDECTOMY  1951   CARDIAC VALVE REPLACEMENT  01/26/19   CATARACT EXTRACTION     CESAREAN SECTION     1971   CHOLECYSTECTOMY N/A 04/28/2019   Procedure: LAPAROSCOPIC CHOLECYSTECTOMY;  Surgeon: Aron Shoulders, MD;  Location: [facility];  Service: General;  Laterality: N/A;   COLONOSCOPY  2018   EYE SURGERY     FEMORAL-POPLITEAL BYPASS GRAFT Left 12/31/2018   Procedure: PATCH ANGIOPLASTY REPAIR LEFT FEMORAL ARTERY USING HEMASHIELD PLATINUM FINESSE PATCH;  Surgeon: Oris Krystal FALCON, MD;  Location: [facility];  Service: Vascular;  Laterality: Left;   IR RADIOLOGY PERIPHERAL GUIDED IV START  05/30/2017   IR RADIOLOGY PERIPHERAL GUIDED IV START  09/02/2018   IR RADIOLOGY PERIPHERAL GUIDED IV START  09/02/2018   IR US  GUIDE VASC ACCESS LEFT  09/02/2018   IR US  GUIDE VASC ACCESS RIGHT  05/30/2017   IR US  GUIDE VASC ACCESS RIGHT  09/02/2018   JOINT REPLACEMENT  2016   sacroplasty   LAMINECTOMY     RIGHT/LEFT HEART CATH AND CORONARY ANGIOGRAPHY N/A 11/27/2018   Procedure:  RIGHT/LEFT HEART CATH AND CORONARY ANGIOGRAPHY;  Surgeon: Wonda Sharper, MD;  Location: [facility] INVASIVE CV LAB;  Service: Cardiovascular;  Laterality: N/A;   TEE WITHOUT CARDIOVERSION N/A 12/30/2018   Procedure: TRANSESOPHAGEAL ECHOCARDIOGRAM (TEE);  Surgeon: Wonda Sharper, MD;  Location: [facility] INVASIVE CV LAB;  Service: Open Heart Surgery;  Laterality: N/A;   TOTAL HIP ARTHROPLASTY Bilateral    TRANSCATHETER AORTIC VALVE REPLACEMENT, TRANSFEMORAL N/A 12/30/2018   Procedure: TRANSCATHETER AORTIC VALVE REPLACEMENT, TRANSFEMORAL;  Surgeon: Wonda Sharper, MD;  Location: [facility] INVASIVE CV LAB;  Service: Open Heart Surgery;  Laterality: N/A;   TUBAL LIGATION  1975    Social History:  Ambulatory   independently        reports that she quit smoking about 20 years ago. Her smoking use included cigarettes. She started smoking about 68 years ago. She has a 72.6 pack-year smoking history. She has never used smokeless tobacco. She reports current alcohol use of about 3.0 - 4.0 standard drinks of alcohol per week. She reports that she does not use drugs.   Family History:   Family History  Problem Relation Age of Onset   Breast cancer Mother        breast   Cancer Brother        lung   Diabetes Son        type 1   Colon cancer Neg Hx    CAD Neg Hx    Colon polyps Neg Hx    Rectal  cancer Neg Hx    Stomach cancer Neg Hx    Transient ischemic attack Neg Hx    Stroke Neg Hx    ______________________________________________________________________________________________ Allergies: No Known Allergies   Prior to Admission medications   Medication Sig Start Date End Date Taking? Authorizing Provider  acetaminophen  (TYLENOL ) 325 MG tablet Take 2 tablets (650 mg total) by mouth every 6 (six) hours as needed for mild pain (or Fever >/= 101). 04/29/19   Vicci Burnard SAUNDERS, PA-C  albuterol  (VENTOLIN  HFA) 108 (90 Base) MCG/ACT inhaler Inhale 2 puffs into the lungs every 6 (six) hours as needed for wheezing or  shortness of breath. Patient not taking: Reported on 04/21/2024 07/09/22   Ruthell Lauraine FALCON, NP  amLODipine  (NORVASC ) 5 MG tablet Take 1 tablet (5 mg total) by mouth daily. 01/10/24   Paz, Jose E, MD  amoxicillin  (AMOXIL ) 500 MG tablet Take 4 tablets (2,000mg ) one hour prior to all dental visits. Patient not taking: Reported on 04/21/2024 01/07/23   Raford Riggs, MD  aspirin  EC 81 MG tablet Take 81 mg by mouth daily.    [provider]  budesonide -glycopyrrolate-formoterol  (BREZTRI  AEROSPHERE) 160-9-4.8 MCG/ACT AERO inhaler Inhale 2 puffs into the lungs in the morning and at bedtime. 04/20/24   Ruthell Lauraine FALCON, NP  budesonide -glycopyrrolate-formoterol  (BREZTRI  AEROSPHERE) 160-9-4.8 MCG/ACT AERO inhaler Inhale 2 puffs into the lungs in the morning and at bedtime. 04/20/24   Ruthell Lauraine FALCON, NP  Calcium  Carb-Cholecalciferol (CALCIUM  600 + D PO) Take 600 mg by mouth 3 (three) times a week.     [provider]  Cholecalciferol (VITAMIN D3) 5000 UNITS CAPS Take 5,000 Units by mouth daily.     [provider]  denosumab  (PROLIA ) 60 MG/ML SOLN injection Inject 60 mg into the skin every 6 (six) months. Administer in upper arm, thigh, or abdomen    [provider]  EPINEPHrine  0.3 mg/0.3 mL IJ SOAJ injection Inject 0.3 mg into the muscle as needed for anaphylaxis. Patient not taking: Reported on 04/21/2024 12/01/17   [provider]  fluticasone (FLONASE) 50 MCG/ACT nasal spray Place 2 sprays into both nostrils daily. 02/22/24   [provider]  furosemide  (LASIX ) 20 MG tablet DAILY AS NEEDED FOR SWELLING Patient not taking: Reported on 04/21/2024 09/02/20   Raford Riggs, MD  hydrochlorothiazide  (MICROZIDE ) 12.5 MG capsule TAKE 1 CAPSULE BY MOUTH EVERY DAY 03/20/24   Raford Riggs, MD  loratadine  (CLARITIN ) 10 MG tablet Take 10 mg by mouth daily as needed for allergies.     [provider]  Magnesium  200 MG TABS 2 tablets with a meal Orally Once a  day    [provider]  magnesium  oxide (MAG-OX) 400 (240 Mg) MG tablet TAKE 1 TABLET BY MOUTH EVERY DAY 10/01/23   Walker, Caitlin S, NP  metoprolol  succinate (TOPROL -XL) 25 MG 24 hr tablet TAKE 1 TABLET BY MOUTH EVERY DAY 11/25/23   Raford Riggs, MD  Multiple Vitamin (MULTIVITAMIN WITH MINERALS) TABS tablet Take 1 tablet by mouth daily.    [provider]  OXYGEN  Inhale 2 L into the lungs continuous.    [provider]  Allie Gutta Calcium  500 MG TABS 1 tablet with meals Orally Twice a day    [provider]  rosuvastatin  (CRESTOR ) 40 MG tablet TAKE 1 TABLET BY MOUTH EVERY DAY 12/19/23   Raford Riggs, MD    ___________________________________________________________________________________________________ Physical Exam:    05/03/2024    6:28 PM 05/03/2024    3:45 PM 05/03/2024  2:14 PM  Vitals with BMI  Systolic 133 134 860  Diastolic 59 70 60  Pulse 67 76 74     1. General:  in No  Acute distress   Chronically ill   -appearing 2. Psychological: Alert and   Oriented 3. Head/ENT:   Moist   Mucous Membranes                          Head Non traumatic, neck supple                        Poor Dentition 4. SKIN:  decreased Skin turgor,  Skin clean Dry and intact no rash    5. Heart: Regular rate and rhythm no  Murmur, no Rub or gallop 6. Lungs:  no wheezes or crackles   7. Abdomen: Soft,  non-tender, Non distended bowel sounds present 8. Lower extremities: no clubbing, cyanosis, no  edema 9. Neurologically Grossly intact, moving all 4 extremities equally   10. MSK: Normal range of motion    Chart has been reviewed  ______________________________________________________________________________________________  Assessment/Plan  83 y.o. female with medical history significant of COPD, atrial fibrillation, TAVR  hypertension moderate pulmonary hypertension. PAD, GERD   Admitted for   Acute on chronic respiratory failure with hypoxia (   Pneumonia    Present on Admission:  CAP (community acquired pneumonia)  CAD (coronary artery disease), native coronary artery  Essential hypertension  Hyperlipidemia  Multiple lung nodules  Acute on chronic respiratory failure with hypoxia (HCC)  COPD with acute exacerbation (HCC)    CAD (coronary artery disease), native coronary artery Chronic stable continue aspirin  81 mg daily  Essential hypertension Resume Norvasc  5 mg daily and Toprol  25 mg daily if blood pressure allows  Hyperlipidemia Chronic stable continue Crestor  40 mg a day  Multiple lung nodules Chronic followed by pulmonology  S/P TAVR (transcatheter aortic valve replacement) Chronic followed by cardiology  CAP (community acquired pneumonia)  - -Patient presenting with  productive cough hypoxia  , and infiltrate   on chest ct -Infiltrate on CXR and 2-3 characteristics (fever, leukocytosis, purulent sputum) are consistent with pneumonia. -This appears to be most likely community-acquired pneumonia.      will admit for treatment of CAP will start on appropriate antibiotic coverage. - Rocephin /azithromycin    Obtain:  sputum cultures,                   COVID PCR negative                    blood cultures and sputum cultures ordered                   strep pneumo UA antigen,                     check for Legionella antigen.                Provide oxygen  as needed.    Acute on chronic respiratory failure with hypoxia (HCC)  this patient has acute respiratory failure with Hypoxia  as documented by the presence of following: O2 saturatio< 90% on RA  Likely due to:   Pneumonia,   Provide O2 therapy and titrate as needed  Continuous pulse ox   check Pulse ox with ambulation prior to discharge   may need  TC consult for home O2 set up    flutter  valve ordered   COPD with acute exacerbation (HCC)  -  - Will initiate: Steroid taper  -  Antibiotics  rocephin /Zithromax  - Albuterol   PRN, - scheduled duoneb,   -  Breo or Dulera at discharge   -  Mucinex .  Titrate O2 to saturation >90%. Follow patients respiratory status.  VBG    Currently mentating well no evidence of symptomatic hypercarbia   Other plan as per orders.  DVT prophylaxis:  SCD      Code Status:    DNR/DNI   as per patient   I had personally discussed CODE STATUS with patient   ACP   has been reviewed     Family Communication:   Family not at  Bedside    Diet  Diet Orders (From admission, onward)     Start     Ordered   05/03/24 1832  Diet Heart Room service appropriate? Yes; Fluid consistency: Thin  Diet effective now       Question Answer Comment  Room service appropriate? Yes   Fluid consistency: Thin      05/03/24 1832            Disposition Plan:       To home once workup is complete and patient is stable   Following barriers for discharge:                              white count improving able to transition to PO antibiotics                                                       Work of breathing improves       Consult Orders  (From admission, onward)           Start     Ordered   05/03/24 1524  Consult to hospitalist  Called [facility] @ 15:30 for consult to hospitalist, spoke with Luke  Once       Provider:  (Not yet assigned)  Question Answer Comment  Place call to: [facility]   Reason for Consult Admit      05/03/24 1523                               Would benefit from PT/OT eval prior to DC  Ordered                     Consults called:    NONE   Admission status:  ED Disposition     ED Disposition  Admit   Condition  --   Comment  Hospital Area: [facility] [[id number]]  Level of Care: Telemetry [5]  Admit to tele based on following criteria: Other see comments  Comments: close monitoring  Interfacility transfer: Yes  May place patient in observation at [facility] or [facility] if equivalent level of care is available:: Yes  Covid Evaluation:  Confirmed COVID Negative  Diagnosis: Pneumonia [227785]  Admitting Physician: BOBBETTE HERCULES [[id number]]  Attending Physician: NEYSA CARON PARAS [[id number]]           Obs     Level of care     tele  For 12H   Shawniece Oyola  Mysti Haley 05/03/2024, 8:48 PM    [facility]     after 2 AM please page floor coverage   If 7AM-7PM, please contact the day team taking care of the patient using [email or web address]

## 2024-05-03 NOTE — Subjective & Objective (Signed)
 1 week history of shortness of breath fatigue cough productive of green sputum.   Patient history of COPD Uses oxygen  usually at night only and as needed during a day found to be 89% on room air started on 2 L Family states that she have had some vomiting after excessive coughing try to use albuterol  but does not seem to help no recent fevers or chills

## 2024-05-03 NOTE — Assessment & Plan Note (Signed)
 Chronic followed by cardiology

## 2024-05-03 NOTE — Assessment & Plan Note (Signed)
-  -   Will initiate: Steroid taper  -  Antibiotics  rocephin /Zithromax  - Albuterol   PRN, - scheduled duoneb,  -  Breo or Dulera at discharge   -  Mucinex .  Titrate O2 to saturation >90%. Follow patients respiratory status.  VBG    Currently mentating well no evidence of symptomatic hypercarbia

## 2024-05-03 NOTE — Assessment & Plan Note (Signed)
Chronic followed by pulmonology

## 2024-05-03 NOTE — ED Notes (Signed)
 RT assessed patient upon arrival to room. SAT 89% on RA. Stated that she wear O2 at bedtime and PRN at home. SAT normally 93% but have been lower for a few days. Placed on 2L. BBS rhonchi with complaints of productive cough.

## 2024-05-03 NOTE — Progress Notes (Signed)
 Admission completed, spoke with Doutova,MD over the phone. Awaiting new orders,. Telemetry placed on patient and verified. Skin reviewed and pt had cardiac monitor on and stated she did not have the charger and that she would remove it for the night. Family bedside and stated they called company of cardiac monitoring to follow up. Pt left in no acute distress.

## 2024-05-03 NOTE — ED Notes (Signed)
 Called [facility] for transport.

## 2024-05-03 NOTE — Care Plan (Signed)
 Plan of Care Note for accepted transfer   Patient: Dominique [name] MRN: [id number]   DOA: 05/03/2024  Facility requesting transfer: [facility] Requesting Provider: Lavanda Lesches, PA Reason for transfer: Pneumonia Facility course:  Patient is a pleasant 83 year old female history of underlying COPD, chronic pulmonary nodules, presented to the ED with a 1 week history of productive cough of greenish sputum, shortness of breath, increasing O2 requirements from baseline.  Patient seen in the ED, chest x-ray done unremarkable.  CT angiogram chest done negative for PE however concerning for probable airspace disease in the right lower lobe with underlying emphysema.  Vital signs stable.  Comprehensive metabolic profile unremarkable.  CBC with leukocytosis.  Patient given a dose of IV vancomycin , IV cefepime  due to concerns for pneumonia.  Plan of care: The patient is accepted for admission to Telemetry unit, at [facility]..    Author: Toribio Hummer, MD 05/03/2024  Check [email or web address] for on-call coverage.  Nursing staff, Please call [facility] Admits & Consults System-Wide number on Amion as soon as patient's arrival, so appropriate admitting provider can evaluate the pt.

## 2024-05-03 NOTE — ED Triage Notes (Addendum)
 For the past week c/o shortness of breath, fatigue, cough with green phlegm.  Wears oxygen  during sleep, had to use it during the day yesterday.

## 2024-05-03 NOTE — Assessment & Plan Note (Signed)
 Chronic stable continue Crestor  40 mg a day

## 2024-05-03 NOTE — Assessment & Plan Note (Signed)
- -  Patient presenting with  productive cough hypoxia  , and infiltrate   on chest ct -Infiltrate on CXR and 2-3 characteristics (fever, leukocytosis, purulent sputum) are consistent with pneumonia. -This appears to be most likely community-acquired pneumonia.      will admit for treatment of CAP will start on appropriate antibiotic coverage. - Rocephin /azithromycin    Obtain:  sputum cultures,                   COVID PCR negative                    blood cultures and sputum cultures ordered                   strep pneumo UA antigen,                     check for Legionella antigen.                Provide oxygen  as needed.

## 2024-05-03 NOTE — Assessment & Plan Note (Signed)
 Resume Norvasc  5 mg daily and Toprol  25 mg daily if blood pressure allows

## 2024-05-03 NOTE — Assessment & Plan Note (Signed)
this patient has acute respiratory failure with Hypoxia as documented by the presence of following: O2 saturatio< 90% on RA   Likely due to:   Pneumonia,   Provide O2 therapy and titrate as needed  Continuous pulse ox   check Pulse ox with ambulation prior to discharge   may need  TC consult for home O2 set up    flutter valve ordered   

## 2024-05-04 DIAGNOSIS — Z87891 Personal history of nicotine dependence: Secondary | ICD-10-CM | POA: Diagnosis not present

## 2024-05-04 DIAGNOSIS — I10 Essential (primary) hypertension: Secondary | ICD-10-CM | POA: Diagnosis present

## 2024-05-04 DIAGNOSIS — J439 Emphysema, unspecified: Secondary | ICD-10-CM | POA: Diagnosis present

## 2024-05-04 DIAGNOSIS — Z96643 Presence of artificial hip joint, bilateral: Secondary | ICD-10-CM | POA: Diagnosis present

## 2024-05-04 DIAGNOSIS — J9601 Acute respiratory failure with hypoxia: Secondary | ICD-10-CM | POA: Diagnosis not present

## 2024-05-04 DIAGNOSIS — Z7982 Long term (current) use of aspirin: Secondary | ICD-10-CM | POA: Diagnosis not present

## 2024-05-04 DIAGNOSIS — I272 Pulmonary hypertension, unspecified: Secondary | ICD-10-CM | POA: Diagnosis present

## 2024-05-04 DIAGNOSIS — R011 Cardiac murmur, unspecified: Secondary | ICD-10-CM | POA: Diagnosis present

## 2024-05-04 DIAGNOSIS — J189 Pneumonia, unspecified organism: Secondary | ICD-10-CM | POA: Diagnosis present

## 2024-05-04 DIAGNOSIS — Z9049 Acquired absence of other specified parts of digestive tract: Secondary | ICD-10-CM | POA: Diagnosis not present

## 2024-05-04 DIAGNOSIS — Z953 Presence of xenogenic heart valve: Secondary | ICD-10-CM | POA: Diagnosis not present

## 2024-05-04 DIAGNOSIS — I4891 Unspecified atrial fibrillation: Secondary | ICD-10-CM | POA: Diagnosis present

## 2024-05-04 DIAGNOSIS — E785 Hyperlipidemia, unspecified: Secondary | ICD-10-CM | POA: Diagnosis present

## 2024-05-04 DIAGNOSIS — M810 Age-related osteoporosis without current pathological fracture: Secondary | ICD-10-CM | POA: Diagnosis present

## 2024-05-04 DIAGNOSIS — I2510 Atherosclerotic heart disease of native coronary artery without angina pectoris: Secondary | ICD-10-CM | POA: Diagnosis present

## 2024-05-04 DIAGNOSIS — J9621 Acute and chronic respiratory failure with hypoxia: Secondary | ICD-10-CM | POA: Diagnosis present

## 2024-05-04 DIAGNOSIS — Z833 Family history of diabetes mellitus: Secondary | ICD-10-CM | POA: Diagnosis not present

## 2024-05-04 DIAGNOSIS — J441 Chronic obstructive pulmonary disease with (acute) exacerbation: Secondary | ICD-10-CM | POA: Diagnosis present

## 2024-05-04 DIAGNOSIS — K219 Gastro-esophageal reflux disease without esophagitis: Secondary | ICD-10-CM | POA: Diagnosis present

## 2024-05-04 DIAGNOSIS — Z79899 Other long term (current) drug therapy: Secondary | ICD-10-CM | POA: Diagnosis not present

## 2024-05-04 DIAGNOSIS — Z7951 Long term (current) use of inhaled steroids: Secondary | ICD-10-CM | POA: Diagnosis not present

## 2024-05-04 DIAGNOSIS — R0602 Shortness of breath: Secondary | ICD-10-CM | POA: Diagnosis present

## 2024-05-04 DIAGNOSIS — J440 Chronic obstructive pulmonary disease with acute lower respiratory infection: Secondary | ICD-10-CM | POA: Diagnosis present

## 2024-05-04 DIAGNOSIS — I7121 Aneurysm of the ascending aorta, without rupture: Secondary | ICD-10-CM | POA: Diagnosis present

## 2024-05-04 DIAGNOSIS — I959 Hypotension, unspecified: Secondary | ICD-10-CM | POA: Diagnosis present

## 2024-05-04 DIAGNOSIS — Z66 Do not resuscitate: Secondary | ICD-10-CM | POA: Diagnosis present

## 2024-05-04 LAB — COMPREHENSIVE METABOLIC PANEL WITH GFR
ALT: 17 U/L (ref 0–44)
AST: 21 U/L (ref 15–41)
Albumin: 2.8 g/dL — ABNORMAL LOW (ref 3.5–5.0)
Alkaline Phosphatase: 40 U/L (ref 38–126)
Anion gap: 9 (ref 5–15)
BUN: 18 mg/dL (ref 8–23)
CO2: 25 mmol/L (ref 22–32)
Calcium: 8.3 mg/dL — ABNORMAL LOW (ref 8.9–10.3)
Chloride: 104 mmol/L (ref 98–111)
Creatinine, Ser: 0.7 mg/dL (ref 0.44–1.00)
GFR, Estimated: >60 mL/min (ref >60)
Glucose, Bld: 100 mg/dL — ABNORMAL HIGH (ref 70–99)
Potassium: 3.7 mmol/L (ref 3.5–5.1)
Sodium: 138 mmol/L (ref 135–145)
Total Bilirubin: 0.7 mg/dL (ref 0.0–1.2)
Total Protein: 6.1 g/dL — ABNORMAL LOW (ref 6.5–8.1)

## 2024-05-04 LAB — CBC
HCT: 38.5 % (ref 36.0–46.0)
Hemoglobin: 12.7 g/dL (ref 12.0–15.0)
MCH: 31.2 pg (ref 26.0–34.0)
MCHC: 33 g/dL (ref 30.0–36.0)
MCV: 94.6 fL (ref 80.0–100.0)
Platelets: 254 K/uL (ref 150–400)
RBC: 4.07 MIL/uL (ref 3.87–5.11)
RDW: 13.1 % (ref 11.5–15.5)
WBC: 10.8 K/uL — ABNORMAL HIGH (ref 4.0–10.5)
nRBC: 0 % (ref 0.0–0.2)

## 2024-05-04 LAB — PHOSPHORUS: Phosphorus: 3 mg/dL (ref 2.5–4.6)

## 2024-05-04 LAB — MAGNESIUM: Magnesium: 1.8 mg/dL (ref 1.7–2.4)

## 2024-05-04 MED ORDER — AMLODIPINE BESYLATE 5 MG PO TABS
5.0000 mg | ORAL_TABLET | Freq: Every day | ORAL | Status: DC
  Administered 2024-05-05: 5 mg via ORAL
  Filled 2024-05-04: qty 1

## 2024-05-04 MED ORDER — ROSUVASTATIN CALCIUM 10 MG PO TABS
40.0000 mg | ORAL_TABLET | Freq: Every day | ORAL | Status: DC
Start: 2024-05-05 — End: 2024-05-05
  Administered 2024-05-05: 40 mg via ORAL
  Filled 2024-05-04: qty 4

## 2024-05-04 MED ORDER — IPRATROPIUM-ALBUTEROL 0.5-2.5 (3) MG/3ML IN SOLN
3.0000 mL | Freq: Three times a day (TID) | RESPIRATORY_TRACT | Status: DC
  Administered 2024-05-04 – 2024-05-05 (×3): 3 mL via RESPIRATORY_TRACT
  Filled 2024-05-04 (×3): qty 3

## 2024-05-04 MED ORDER — MAGNESIUM SULFATE 2 GM/50ML IV SOLN
2.0000 g | Freq: Once | INTRAVENOUS | Status: AC
  Administered 2024-05-04: 2 g via INTRAVENOUS
  Filled 2024-05-04: qty 50

## 2024-05-04 MED ORDER — ALBUTEROL SULFATE (2.5 MG/3ML) 0.083% IN NEBU: 2.5000 mg | INHALATION_SOLUTION | RESPIRATORY_TRACT | Status: DC | PRN

## 2024-05-04 MED ORDER — MAGNESIUM OXIDE -MG SUPPLEMENT 400 (240 MG) MG PO TABS
400.0000 mg | ORAL_TABLET | Freq: Every day | ORAL | Status: DC
  Administered 2024-05-05: 400 mg via ORAL
  Filled 2024-05-04: qty 1

## 2024-05-04 NOTE — Plan of Care (Signed)
  Problem: Education: Goal: Knowledge of General Education information will improve Description: Including pain rating scale, medication(s)/side effects and non-pharmacologic comfort measures Outcome: Progressing   Problem: Health Behavior/Discharge Planning: Goal: Ability to manage health-related needs will improve Outcome: Progressing   Problem: Clinical Measurements: Goal: Ability to maintain clinical measurements within normal limits will improve Outcome: Progressing Goal: Will remain free from infection Outcome: Progressing Goal: Diagnostic test results will improve Outcome: Progressing Goal: Respiratory complications will improve Outcome: Progressing Goal: Cardiovascular complication will be avoided Outcome: Progressing   Problem: Activity: Goal: Risk for activity intolerance will decrease Outcome: Progressing   Problem: Nutrition: Goal: Adequate nutrition will be maintained Outcome: Progressing   Problem: Coping: Goal: Level of anxiety will decrease Outcome: Progressing   Problem: Elimination: Goal: Will not experience complications related to bowel motility Outcome: Progressing Goal: Will not experience complications related to urinary retention Outcome: Progressing   Problem: Pain Managment: Goal: General experience of comfort will improve and/or be controlled Outcome: Progressing   Problem: Safety: Goal: Ability to remain free from injury will improve Outcome: Progressing   Problem: Skin Integrity: Goal: Risk for impaired skin integrity will decrease Outcome: Progressing   Problem: Activity: Goal: Ability to tolerate increased activity will improve Outcome: Progressing   Problem: Clinical Measurements: Goal: Ability to maintain a body temperature in the normal range will improve Outcome: Progressing   Problem: Respiratory: Goal: Ability to maintain adequate ventilation will improve Outcome: Progressing Goal: Ability to maintain a clear airway  will improve Outcome: Progressing   Problem: Education: Goal: Knowledge of disease or condition will improve Outcome: Progressing Goal: Knowledge of the prescribed therapeutic regimen will improve Outcome: Progressing Goal: Individualized Educational Video(s) Outcome: Progressing   Problem: Activity: Goal: Ability to tolerate increased activity will improve Outcome: Progressing Goal: Will verbalize the importance of balancing activity with adequate rest periods Outcome: Progressing   Problem: Respiratory: Goal: Ability to maintain a clear airway will improve Outcome: Progressing Goal: Levels of oxygenation will improve Outcome: Progressing Goal: Ability to maintain adequate ventilation will improve Outcome: Progressing

## 2024-05-04 NOTE — Progress Notes (Signed)
 PROGRESS NOTE    Patient: Dominique [name]                            PCP: Amon Aloysius BRAVO, MD                    DOB: 1941-03-22            DOA: 05/03/2024 FMW:[id number]             DOS: 05/04/2024, 10:27 AM   LOS: 0 days   Date of Service: The patient was seen and examined on 05/04/2024  Subjective:   The patient was seen and examined this morning. Tachypneic this morning, respirate of 22, blood pressure stable at 125/85, satting 85% on 3 L of oxygen , Denies of having any chest pain.  No shortness of breath at rest only with cough and exertion   Brief Narrative:   Dominique [name] is a 83 y.o. female with medical history significant of COPD,  TAVR , hypertension moderate pulmonary hypertension. PAD, GERD Presented with  1 wk cough, shortness of breath fatigue cough productive of green sputum.   Patient history of COPD, Uses oxygen  usually at night only and as needed during a day found to be 89% on room air started on 2 L.  Family states that she have had some vomiting after excessive coughing try to use albuterol  but does not seem to help no recent fevers or chills.  No history of alcohol or tobacco abuse.  ED: Was hypotensive with BP of 74/53, was already on 2 L of oxygen  on arrival satting 99%. CBC BMP within normal limits with exception of WBC of 11.8, Pro-Cal < 0.10, viral respiratory panel negative  CT angiogram: Negative for pulmonary embolism, Possible right lower lobe infiltrate, atelectasis, trace right pleural effusion scattered groundglass appearance and lung nodules bilaterally possible indwelling adenocarcinoma, emphysema, CAD, aortic atherosclerosis  Admitted for acute hypoxic respiratory failure likely due to pneumonia and COPD exacerbation      Assessment & Plan:   Principal Problem:   CAP (community acquired pneumonia) Active Problems:   Essential hypertension   Hyperlipidemia   COPD with acute exacerbation (HCC)   Multiple lung nodules   CAD (coronary artery  disease), native coronary artery   S/P TAVR (transcatheter aortic valve replacement)   Acute on chronic respiratory failure with hypoxia (HCC)      Assessment and plan :    Acute on chronic respiratory failure with hypoxia (HCC) - Patient -his oxygen  as needed at home more at night -Currently requiring 3 L of oxygen , satting 85% Reassessing titrating O2 to goal of 88-92%  POA; O2 saturatio< 90% on RA ,  - Remains tachypneic -Pro-Cal < 0.10 Likely due to:   Pneumonia,   -Treat underlying cause with steroids and  antibiotics -Nebs - Continuous pulse ox  - check Pulse ox with ambulation prior to discharge - TOC consult for home O2 set up  - Will encourage the use of a spirometer, and flutter valve ordered   CAP (community acquired pneumonia)  - Hemodynamically stable, normotensive, improved leukocytosis  -Viral respiratory panel negative -Patient presenting with  productive cough hypoxia  , and infiltrate on chest ct -Infiltrate on CXR and 2-3 characteristics (fever, leukocytosis, purulent sputum) are consistent with pneumonia. -This appears to be most likely community-acquired pneumonia.  -Continue current antibiotics We will follow with cultures      COPD with acute exacerbation (  HCC) -Continue IV antibiotics, IV steroids with taper  - Albuterol   PRN, - scheduled duoneb,  -  Breo or Dulera at discharge   -  Mucinex .  Titrate O2 to saturation >90%. Follow patients respiratory status.  VBG     CAD (coronary artery disease), native coronary artery Chronic stable continue aspirin  81 mg daily   Essential hypertension Monitoring BP closely Resume Norvasc  5 mg daily and Toprol  25 mg daily if blood pressure allows   Hyperlipidemia Chronic stable continue Crestor  40 mg a day   Multiple lung nodules Chronic followed by pulmonology   S/P TAVR (transcatheter aortic valve replacement) Chronic followed by  cardiology     ----------------------------------------------------------------------------------------------------------------------------------------------- Nutritional status:  The patient's BMI is: Body mass index is 26.44 kg/m. I agree with the assessment and plan as outlined     ---------------------------------------------------------------------------------------------------------------------------------------------------- Cultures; Blood Cultures x 2 >> NGT Urine Culture  >>> NGT  Sputum Culture >> NGT    ------------------------------------------------------------------------------------------------------------------------------------------------  DVT prophylaxis:  SCDs Start: 05/03/24 2032   Code Status:   Code Status: Limited: Do not attempt resuscitation (DNR) -DNR-LIMITED -Do Not Intubate/DNI   Family Communication: No family member present at bedside--Advance care planning has been discussed.   Admission status:   Status is: Inpatient Patient meets inpatient criteria due to acute hypoxic respiratory failure, needing oxygen  supplements, treating for pneumonia-needing IV antibiotics    Disposition: From  - home             Planning for discharge in 2 days: Likely home with home health and O2 supplements  Procedures:   No admission procedures for hospital encounter.   Antimicrobials:  Anti-infectives (From admission, onward)    Start     Dose/Rate Route Frequency Ordered Stop   05/03/24 2300  azithromycin  (ZITHROMAX ) 500 mg in sodium chloride  0.9 % 250 mL IVPB        500 mg 250 mL/hr over 60 Minutes Intravenous Every 24 hours 05/03/24 1854 05/08/24 2259   05/03/24 2200  cefTRIAXone  (ROCEPHIN ) 2 g in sodium chloride  0.9 % 100 mL IVPB        2 g 200 mL/hr over 30 Minutes Intravenous Every 24 hours 05/03/24 1854 05/08/24 2159   05/03/24 1530  vancomycin  (VANCOCIN ) IVPB 1000 mg/200 mL premix        1,000 mg 200 mL/hr over 60 Minutes Intravenous  Once  05/03/24 1516 05/03/24 1715   05/03/24 1530  ceFEPIme  (MAXIPIME ) 2 g in sodium chloride  0.9 % 100 mL IVPB        2 g 200 mL/hr over 30 Minutes Intravenous  Once 05/03/24 1516 05/03/24 1606        Medication:   [START ON 05/05/2024] amLODipine   5 mg Oral Daily   aspirin  EC  81 mg Oral Daily   guaiFENesin   600 mg Oral BID   ipratropium-albuterol   3 mL Nebulization QID   [START ON 05/05/2024] magnesium  oxide  400 mg Oral Daily   methylPREDNISolone  (SOLU-MEDROL ) injection  40 mg Intravenous Q12H   Followed by   [START ON 05/05/2024] predniSONE   40 mg Oral Q breakfast   metoprolol  succinate  25 mg Oral Daily   [START ON 05/05/2024] rosuvastatin   40 mg Oral Daily    sodium chloride , acetaminophen  **OR** acetaminophen , HYDROcodone -acetaminophen , loratadine , ondansetron  **OR** ondansetron  (ZOFRAN ) IV   Objective:   Vitals:   05/04/24 0603 05/04/24 0918 05/04/24 0930 05/04/24 0958  BP: 122/64 (!) 125/55    Pulse: 73 76    Resp: (!) 22  Temp: 98.6 F (37 C)     TempSrc:      SpO2: 93%  94% (!) 85%  Weight:      Height:        Intake/Output Summary (Last 24 hours) at 05/04/2024 1027 Last data filed at 05/04/2024 0900 Gross per 24 hour  Intake 822.83 ml  Output --  Net 822.83 ml   Filed Weights   05/03/24 1103  Weight: 74.3 kg     Physical examination:   Constitution:  Alert, cooperative, no distress,  Appears calm and comfortable    HEENT:        Normocephalic, PERRL, otherwise with in Normal limits  Chest:         Chest symmetric Cardio vascular:  S1/S2, RRR, No murmure, No Rubs or Gallops  pulmonary: Diffuse bilateral rhonchi, wheezing, diminished breath sounds in lower lobes, negative for any rales or crackles Abdomen: Soft, non-tender, non-distended, bowel sounds,no masses, no organomegaly Muscular skeletal: Global generalized weakness Limited exam - in bed, able to move all 4 extremities,   Neuro: CNII-XII intact. , normal motor and sensation, reflexes intact   Extremities: No pitting edema lower extremities, +2 pulses  Skin: Dry, warm to touch, negative for any Rashes, No open wounds Wounds: per nursing documentation   ------------------------------------------------------------------------------------------------------------------------------------------    LABs:     Latest Ref Rng & Units 05/04/2024    5:26 AM 05/03/2024   12:21 PM 05/03/2024   12:09 PM  CBC  WBC 4.0 - 10.5 K/uL 10.8   11.8   Hemoglobin 12.0 - 15.0 g/dL 87.2  86.3  86.6   Hematocrit 36.0 - 46.0 % 38.5  40.0  40.1   Platelets 150 - 400 K/uL 254   286       Latest Ref Rng & Units 05/04/2024    5:26 AM 05/03/2024   12:21 PM 05/03/2024   12:09 PM  CMP  Glucose 70 - 99 mg/dL 899   97   BUN 8 - 23 mg/dL 18   16   Creatinine 9.55 - 1.00 mg/dL 9.29   9.25   Sodium 864 - 145 mmol/L 138  138  139   Potassium 3.5 - 5.1 mmol/L 3.7  3.8  3.8   Chloride 98 - 111 mmol/L 104   99   CO2 22 - 32 mmol/L 25   29   Calcium  8.9 - 10.3 mg/dL 8.3   9.4   Total Protein 6.5 - 8.1 g/dL 6.1   7.1   Total Bilirubin 0.0 - 1.2 mg/dL 0.7   0.4   Alkaline Phos 38 - 126 U/L 40   59   AST 15 - 41 U/L 21   23   ALT 0 - 44 U/L 17   19        Micro Results Recent Results (from the past 240 hours)  Resp panel by RT-PCR (RSV, Flu A&B, Covid) Anterior Nasal Swab     Status: None   Collection Time: 05/03/24 11:15 AM   Specimen: Anterior Nasal Swab  Result Value Ref Range Status   SARS Coronavirus 2 by RT PCR NEGATIVE NEGATIVE Final    Comment: (NOTE) SARS-CoV-2 target nucleic acids are NOT DETECTED.  The SARS-CoV-2 RNA is generally detectable in upper respiratory specimens during the acute phase of infection. The lowest concentration of SARS-CoV-2 viral copies this assay can detect is 138 copies/mL. A negative result does not preclude SARS-Cov-2 infection and should not be used as the sole basis for treatment  or other patient management decisions. A negative result may occur with  improper  specimen collection/handling, submission of specimen other than nasopharyngeal swab, presence of viral mutation(s) within the areas targeted by this assay, and inadequate number of viral copies(<138 copies/mL). A negative result must be combined with clinical observations, patient history, and epidemiological information. The expected result is Negative.  Fact Sheet for Patients:  BloggerCourse.com  Fact Sheet for Healthcare Providers:  SeriousBroker.it  This test is no t yet approved or cleared by the [place]  FDA and  has been authorized for detection and/or diagnosis of SARS-CoV-2 by FDA under an Emergency Use Authorization (EUA). This EUA will remain  in effect (meaning this test can be used) for the duration of the COVID-19 declaration under Section 564(b)(1) of the Act, 21 U.S.C.section 360bbb-3(b)(1), unless the authorization is terminated  or revoked sooner.       Influenza A by PCR NEGATIVE NEGATIVE Final   Influenza B by PCR NEGATIVE NEGATIVE Final    Comment: (NOTE) The Xpert Xpress SARS-CoV-2/FLU/RSV plus assay is intended as an aid in the diagnosis of influenza from Nasopharyngeal swab specimens and should not be used as a sole basis for treatment. Nasal washings and aspirates are unacceptable for Xpert Xpress SARS-CoV-2/FLU/RSV testing.  Fact Sheet for Patients: BloggerCourse.com  Fact Sheet for Healthcare Providers: SeriousBroker.it  This test is not yet approved or cleared by the [place]  FDA and has been authorized for detection and/or diagnosis of SARS-CoV-2 by FDA under an Emergency Use Authorization (EUA). This EUA will remain in effect (meaning this test can be used) for the duration of the COVID-19 declaration under Section 564(b)(1) of the Act, 21 U.S.C. section 360bbb-3(b)(1), unless the authorization is terminated or revoked.     Resp  Syncytial Virus by PCR NEGATIVE NEGATIVE Final    Comment: (NOTE) Fact Sheet for Patients: BloggerCourse.com  Fact Sheet for Healthcare Providers: SeriousBroker.it  This test is not yet approved or cleared by the [place]  FDA and has been authorized for detection and/or diagnosis of SARS-CoV-2 by FDA under an Emergency Use Authorization (EUA). This EUA will remain in effect (meaning this test can be used) for the duration of the COVID-19 declaration under Section 564(b)(1) of the Act, 21 U.S.C. section 360bbb-3(b)(1), unless the authorization is terminated or revoked.  Performed at [facility], [address] [place], [place] [address]     Radiology Reports CT Angio Chest PE W and/or Wo Contrast Result Date: 05/03/2024 CLINICAL DATA:  Pulmonary embolism suspected, high probability. Shortness of breath, fatigue, and productive cough. EXAM: CT ANGIOGRAPHY CHEST WITH CONTRAST TECHNIQUE: Multidetector CT imaging of the chest was performed using the standard protocol during bolus administration of intravenous contrast. Multiplanar CT image reconstructions and MIPs were obtained to evaluate the vascular anatomy. RADIATION DOSE REDUCTION: This exam was performed according to the departmental dose-optimization program which includes automated exposure control, adjustment of the mA and/or kV according to patient size and/or use of iterative reconstruction technique. CONTRAST:  75mL OMNIPAQUE  IOHEXOL  350 MG/ML SOLN COMPARISON:  02/25/2024. FINDINGS: Cardiovascular: The heart is normal in size and there is no pericardial effusion. Scattered coronary artery calcifications are noted. A TAVR stent is in place. There is atherosclerotic calcification of the aorta without evidence of aneurysm. There is distension of the pulmonary arteries bilaterally which may be associated with underlying pulmonary artery hypertension. There is no evidence of  pulmonary embolism. Mediastinum/Nodes: No mediastinal or axillary lymphadenopathy. Prominent lymph nodes are present at the  right hilum measuring up to 1.1 cm. The thyroid  gland, trachea, and esophagus are within normal limits. There is a small hiatal hernia. Lungs/Pleura: Paraseptal and centrilobular emphysematous changes are present in the lungs. There is a trace right pleural effusion. Mild airspace disease is noted in the right lower lobe. No pneumothorax is seen. Stable consolidation is noted in the posterior segment of the left upper lobe. There is a nodular opacity in the left upper lobe measuring 5 mm, axial image 39. A stable 1.3 cm ground-glass nodule is noted in the right upper lobe, axial image 32. A stable 1.5 cm ground-glass nodule is noted in the right upper lobe, axial image 46 there is a 5 mm nodule in the right upper lobe, axial image 66, new from the previous exam. Ground-glass nodule is noted in the right upper lobe, axial image 46. There is a stable 4 mm nodule in the right middle lobe, axial image 90. Upper Abdomen: A stable hypodensities are noted in the kidneys, previously characterized as cysts. No acute abnormality. Musculoskeletal: No acute osseous abnormality. Review of the MIP images confirms the above findings. IMPRESSION: 1. No evidence of pulmonary embolism. 2. Mild airspace disease in the right lower lobe, possible atelectasis or infiltrate. 3. Trace right pleural effusion. 4. Scattered ground-glass opacities and pulmonary nodules bilaterally, concerning for indolent adenocarcinoma. 5. Emphysema. 6. Coronary artery calcifications. 7. Aortic atherosclerosis. Electronically Signed   By: Leita Birmingham M.D.   On: 05/03/2024 15:07   DG Chest Port 1 View Result Date: 05/03/2024 CLINICAL DATA:  Shortness of breath, fatigue and cough productive of green sputum. EXAM: PORTABLE CHEST 1 VIEW COMPARISON:  06/08/2019 FINDINGS: Heart size and mediastinal contours are normal. Status post TAVR.  Prominent bilateral pulmonary arteries. No pleural fluid: Interstitial edema, or airspace consolidation. Visualized osseous structures are unremarkable. IMPRESSION: 1. No acute cardiopulmonary abnormalities. 2. Prominent bilateral pulmonary arteries. Correlate for any clinical signs or symptoms of pulmonary arterial hypertension. Electronically Signed   By: Birmingham Calk M.D.   On: 05/03/2024 11:29    SIGNED: Adriana DELENA Grams, MD, FHM. FAAFP. Jolynn Pack - [facility] Time spent - 55 min.  In seeing, evaluating and examining the patient. Reviewing medical records, labs, drawn plan of care. [facility],  Pager (please use [email or web address] to page/ text) Please use Epic Secure Chat for non-urgent communication (7AM-7PM)  If 7PM-7AM, please contact night-coverage [email or web address], 05/04/2024, 10:27 AM

## 2024-05-04 NOTE — Progress Notes (Signed)
 Mobility Specialist - Progress Note   05/04/24 0958  Oxygen  Therapy  SpO2 (!) 85 %  O2 Device Nasal Cannula  O2 Flow Rate (L/min) 3 L/min  Patient Activity (if Appropriate) Ambulating  Mobility  Activity Ambulated independently in hallway  Level of Assistance Independent  Assistive Device Other (Comment) (IV Pole)  Distance Ambulated (ft) 460 ft  Activity Response Tolerated well  Mobility Referral Yes  Mobility visit 1 Mobility  Mobility Specialist Start Time (ACUTE ONLY) 0945  Mobility Specialist Stop Time (ACUTE ONLY) 0957  Mobility Specialist Time Calculation (min) (ACUTE ONLY) 12 min   Pt received in bed and agreeable to mobility. Desat to 85%, encouraged pursed lip breaths bringing SpO2 to 93%. No complaints during session. Pt to bed after session with all needs met.    Pre-mobility: 92% SpO2 (3L [place]) During mobility: 85-93% SpO2 (3L [place]) Post-mobility: 93% SPO2 (3L )  Chief Technology Officer

## 2024-05-04 NOTE — Hospital Course (Addendum)
 Dominique [name] is a 83 y.o. female with medical history significant of COPD,  TAVR , hypertension moderate pulmonary hypertension. PAD, GERD Presented with  1 wk cough, shortness of breath fatigue cough productive of green sputum.   Patient history of COPD, Uses oxygen  usually at night only and as needed during a day found to be 89% on room air started on 2 L.  Family states that she have had some vomiting after excessive coughing try to use albuterol  but does not seem to help no recent fevers or chills.  No history of alcohol or tobacco abuse.  ED: Was hypotensive with BP of 74/53, was already on 2 L of oxygen  on arrival satting 99%. CBC BMP within normal limits with exception of WBC of 11.8, Pro-Cal < 0.10, viral respiratory panel negative  CT angiogram: Negative for pulmonary embolism, Possible right lower lobe infiltrate, atelectasis, trace right pleural effusion scattered groundglass appearance and lung nodules bilaterally possible indwelling adenocarcinoma, emphysema, CAD, aortic atherosclerosis  Admitted for acute hypoxic respiratory failure likely due to pneumonia and COPD exacerbation      Assessment & Plan:   Principal Problem:   CAP (community acquired pneumonia) Active Problems:   Essential hypertension   Hyperlipidemia   COPD with acute exacerbation (HCC)   Multiple lung nodules   CAD (coronary artery disease), native coronary artery   S/P TAVR (transcatheter aortic valve replacement)   Acute on chronic respiratory failure with hypoxia (HCC)      Assessment and plan :    Acute on chronic respiratory failure with hypoxia (HCC) - Patient -his oxygen  as needed at home more at night -Currently requiring 3 L of oxygen , satting 85% Reassessing titrating O2 to goal of 88-92%  POA; O2 saturatio< 90% on RA ,  - Remains tachypneic -Pro-Cal < 0.10 Likely due to:   Pneumonia,   -Treat underlying cause with steroids and  antibiotics -Nebs - Continuous pulse ox  -  check Pulse ox with ambulation prior to discharge - TOC consult for home O2 set up  - Will encourage the use of a spirometer, and flutter valve ordered   CAP (community acquired pneumonia)  - Hemodynamically stable, normotensive, improved leukocytosis  -Viral respiratory panel negative -Patient presenting with  productive cough hypoxia  , and infiltrate on chest ct -Infiltrate on CXR and 2-3 characteristics (fever, leukocytosis, purulent sputum) are consistent with pneumonia. -This appears to be most likely community-acquired pneumonia.  -Continue current antibiotics We will follow with cultures      COPD with acute exacerbation (HCC) -Continue IV antibiotics, IV steroids with taper  - Albuterol   PRN, - scheduled duoneb,  -  Breo or Dulera at discharge   -  Mucinex .  Titrate O2 to saturation >90%. Follow patients respiratory status.  VBG     CAD (coronary artery disease), native coronary artery Chronic stable continue aspirin  81 mg daily   Essential hypertension Monitoring BP closely Resume Norvasc  5 mg daily and Toprol  25 mg daily if blood pressure allows   Hyperlipidemia Chronic stable continue Crestor  40 mg a day   Multiple lung nodules Chronic followed by pulmonology   S/P TAVR (transcatheter aortic valve replacement) Chronic followed by cardiology

## 2024-05-04 NOTE — Plan of Care (Signed)
   Problem: Clinical Measurements: Goal: Ability to maintain clinical measurements within normal limits will improve Outcome: Progressing Goal: Will remain free from infection Outcome: Progressing Goal: Diagnostic test results will improve Outcome: Progressing Goal: Respiratory complications will improve Outcome: Progressing

## 2024-05-05 ENCOUNTER — Other Ambulatory Visit (HOSPITAL_COMMUNITY): Payer: Self-pay

## 2024-05-05 DIAGNOSIS — J189 Pneumonia, unspecified organism: Secondary | ICD-10-CM | POA: Diagnosis not present

## 2024-05-05 MED ORDER — GUAIFENESIN ER 600 MG PO TB12: 600.0000 mg | ORAL_TABLET | Freq: Two times a day (BID) | ORAL | 0 refills | Status: DC

## 2024-05-05 MED ORDER — LEVOFLOXACIN 750 MG PO TABS
750.0000 mg | ORAL_TABLET | Freq: Every day | ORAL | 0 refills | Status: DC
  Filled 2024-05-05: qty 3, 3d supply, fill #0

## 2024-05-05 MED ORDER — BREZTRI AEROSPHERE 160-9-4.8 MCG/ACT IN AERO: 2.0000 | INHALATION_SPRAY | Freq: Two times a day (BID) | RESPIRATORY_TRACT | Status: DC

## 2024-05-05 MED ORDER — METHYLPREDNISOLONE 4 MG PO TBPK: ORAL_TABLET | ORAL | 0 refills | Status: DC

## 2024-05-05 MED ORDER — GUAIFENESIN ER 600 MG PO TB12
600.0000 mg | ORAL_TABLET | Freq: Two times a day (BID) | ORAL | 0 refills | Status: AC
Start: 2024-05-05 — End: 2024-05-10
  Filled 2024-05-05: qty 10, 5d supply, fill #0

## 2024-05-05 MED ORDER — METHYLPREDNISOLONE 4 MG PO TBPK
ORAL_TABLET | ORAL | 0 refills | Status: DC
  Filled 2024-05-05: qty 21, 6d supply, fill #0

## 2024-05-05 MED ORDER — LEVOFLOXACIN 750 MG PO TABS: 750.0000 mg | ORAL_TABLET | Freq: Every day | ORAL | 0 refills | Status: DC

## 2024-05-05 NOTE — Progress Notes (Signed)
 AVS reviewed w/ patient who verbalized an understanding. Discharge meds changed to [facility] pharmacy  due to antibiotic not being available at [facility]. PIV removed as noted. [employer] attempted to deliver travel tank, but patient refused. [place] states her travel tank is good for 30 minutes and as long as she can plug it in to recharge she is ok. Daughter-in-law is enroute to hospital w/ O2, will call on arrival. Pt will transport to lobby via w/c w/hospital O2. Primary nurse updated

## 2024-05-05 NOTE — Discharge Summary (Signed)
 Physician Discharge Summary   Patient: Dominique [name] MRN: [id number] DOB: Jan 23, 1941  Admit date:     05/03/2024  Discharge date: 05/05/24  Discharge Physician: Adriana DELENA Grams   PCP: Amon Aloysius BRAVO, MD   Recommendations at discharge:   Follow-up with PCP in 1-2 weeks Continue inhalers as directed Continue oxygen  as needed with maintaining O2 sat 88-92% Continue current course of antibiotics and taper down steroids  Discharge Diagnoses: Principal Problem:   CAP (community acquired pneumonia) Active Problems:   Essential hypertension   Hyperlipidemia   COPD with acute exacerbation (HCC)   Multiple lung nodules   CAD (coronary artery disease), native coronary artery   S/P TAVR (transcatheter aortic valve replacement)   Acute on chronic respiratory failure with hypoxia (HCC)   Acute hypoxic respiratory failure (HCC)  Resolved Problems:   * No resolved hospital problems. *  Hospital Course: Dominique [name] is a 83 y.o. female with medical history significant of COPD,  TAVR , hypertension moderate pulmonary hypertension. PAD, GERD Presented with  1 wk cough, shortness of breath fatigue cough productive of green sputum.   Patient history of COPD, Uses oxygen  usually at night only and as needed during a day found to be 89% on room air started on 2 L.  Family states that she have had some vomiting after excessive coughing try to use albuterol  but does not seem to help no recent fevers or chills.  No history of alcohol or tobacco abuse.  ED: Was hypotensive with BP of 74/53, was already on 2 L of oxygen  on arrival satting 99%. CBC BMP within normal limits with exception of WBC of 11.8, Pro-Cal < 0.10, viral respiratory panel negative  CT angiogram: Negative for pulmonary embolism, Possible right lower lobe infiltrate, atelectasis, trace right pleural effusion scattered groundglass appearance and lung nodules bilaterally possible indwelling adenocarcinoma, emphysema, CAD, aortic  atherosclerosis  Admitted for acute hypoxic respiratory failure likely due to pneumonia and COPD exacerbation     Acute on chronic respiratory failure with hypoxia (HCC) - Patient -his oxygen  as needed at home more at night - Much improved currently satting 94% on 2 L of oxygen    POA; O2 saturation <90% on RA ,  - Remains tachypneic -Pro-Cal < 0.10 Likely due to:   Pneumonia,   - Antibiotic switched to p.o., continue taper down steroid (Medrol  Dosepak)  - Continue home inhalers   -Continue use of spirometer, and flutter valve ordered   CAP (community acquired pneumonia)  - Hemodynamically stable, normotensive, improved leukocytosis  -Viral respiratory panel negative -Patient presenting with  productive cough hypoxia  , and infiltrate on chest ct -Infiltrate on CXR and 2-3 characteristics (fever, leukocytosis, purulent sputum) are consistent with pneumonia. --Was on IV antibiotics of Rocephin  and doxycycline  switched to p.o. Levaquin -concluding 5 days of treatment      COPD with acute exacerbation (HCC) - Discontinuing IV steroids, initiating p.o. prednisone  with quick taper as an outpatient   - Continue Breo or Dulera at discharge   - Continue Mucinex     CAD (coronary artery disease), native coronary artery Chronic stable continue aspirin  81 mg daily   Essential hypertension  Resume Norvasc  5 mg daily and Toprol  25 mg daily if blood pressure allows   Hyperlipidemia Chronic stable continue Crestor  40 mg a day   Multiple lung nodules Chronic followed by pulmonology   S/P TAVR (transcatheter aortic valve replacement) Chronic followed by cardiology     Disposition: Home Diet recommendation:  Discharge Diet  Orders (From admission, onward)     Start     Ordered   05/05/24 0000  Diet - low sodium heart healthy        05/05/24 1201           Cardiac diet DISCHARGE MEDICATION: Allergies as of 05/05/2024   No Known Allergies      Medication List      STOP taking these medications    amoxicillin  500 MG tablet Commonly known as: AMOXIL    hydrochlorothiazide  12.5 MG capsule Commonly known as: MICROZIDE        TAKE these medications    acetaminophen  325 MG tablet Commonly known as: TYLENOL  Take 2 tablets (650 mg total) by mouth every 6 (six) hours as needed for mild pain (or Fever >/= 101).   albuterol  108 (90 Base) MCG/ACT inhaler Commonly known as: Ventolin  HFA Inhale 2 puffs into the lungs every 6 (six) hours as needed for wheezing or shortness of breath.   amLODipine  5 MG tablet Commonly known as: NORVASC  Take 1 tablet (5 mg total) by mouth daily.   aspirin  EC 81 MG tablet Take 81 mg by mouth daily.   Breztri  Aerosphere 160-9-4.8 MCG/ACT Aero inhaler Generic drug: budesonide -glycopyrrolate-formoterol  Inhale 2 puffs into the lungs in the morning and at bedtime.   Breztri  Aerosphere 160-9-4.8 MCG/ACT Aero inhaler Generic drug: budesonide -glycopyrrolate-formoterol  Inhale 2 puffs into the lungs in the morning and at bedtime.   denosumab  60 MG/ML Soln injection Commonly known as: PROLIA  Inject 60 mg into the skin every 6 (six) months. Administer in upper arm, thigh, or abdomen   EPINEPHrine  0.3 mg/0.3 mL Soaj injection Commonly known as: EPI-PEN Inject 0.3 mg into the muscle as needed for anaphylaxis.   fexofenadine 180 MG tablet Commonly known as: ALLEGRA Take 180 mg by mouth daily as needed for allergies or rhinitis.   fluticasone 50 MCG/ACT nasal spray Commonly known as: FLONASE Place 1 spray into both nostrils at bedtime.   furosemide  20 MG tablet Commonly known as: LASIX  DAILY AS NEEDED FOR SWELLING   guaiFENesin  600 MG 12 hr tablet Commonly known as: MUCINEX  Take 1 tablet (600 mg total) by mouth 2 (two) times daily for 5 days.   levofloxacin  750 MG tablet Commonly known as: Levaquin  Take 1 tablet (750 mg total) by mouth daily for 3 days. Start taking on: May 06, 2024   magnesium  oxide 400 (240  Mg) MG tablet Commonly known as: MAG-OX TAKE 1 TABLET BY MOUTH EVERY DAY   methylPREDNISolone  4 MG Tbpk tablet Commonly known as: MEDROL  DOSEPAK Medrol  Dosepak take as instructed   metoprolol  succinate 25 MG 24 hr tablet Commonly known as: TOPROL -XL TAKE 1 TABLET BY MOUTH EVERY DAY   multivitamin with minerals Tabs tablet Take 1 tablet by mouth daily with breakfast.   OXYGEN  Inhale 2 L/min into the lungs at bedtime.   rosuvastatin  40 MG tablet Commonly known as: CRESTOR  TAKE 1 TABLET BY MOUTH EVERY DAY   Vitamin D3 125 MCG (5000 UT) Caps Take 5,000 Units by mouth daily.        Discharge Exam: Filed Weights   05/03/24 1103  Weight: 74.3 kg   General:  AAO x 3,  cooperative, no distress;   HEENT:  Normocephalic, PERRL, otherwise with in Normal limits   Neuro:  CNII-XII intact. , normal motor and sensation, reflexes intact   Lungs:   Clear to auscultation BL, Respirations unlabored,  No wheezes / crackles  Cardio:    S1/S2, RRR, No murmure,  No Rubs or Gallops   Abdomen:  Soft, non-tender, bowel sounds active all four quadrants, no guarding or peritoneal signs.  Muscular  skeletal:  Limited exam -global generalized weaknesses - in bed, able to move all 4 extremities,   2+ pulses,  symmetric, No pitting edema  Skin:  Dry, warm to touch, negative for any Rashes,  Wounds: Please see nursing documentation     Condition at discharge: good  The results of significant diagnostics from this hospitalization (including imaging, microbiology, ancillary and laboratory) are listed below for reference.   Imaging Studies: CT Angio Chest PE W and/or Wo Contrast Result Date: 05/03/2024 CLINICAL DATA:  Pulmonary embolism suspected, high probability. Shortness of breath, fatigue, and productive cough. EXAM: CT ANGIOGRAPHY CHEST WITH CONTRAST TECHNIQUE: Multidetector CT imaging of the chest was performed using the standard protocol during bolus administration of intravenous contrast.  Multiplanar CT image reconstructions and MIPs were obtained to evaluate the vascular anatomy. RADIATION DOSE REDUCTION: This exam was performed according to the departmental dose-optimization program which includes automated exposure control, adjustment of the mA and/or kV according to patient size and/or use of iterative reconstruction technique. CONTRAST:  75mL OMNIPAQUE  IOHEXOL  350 MG/ML SOLN COMPARISON:  02/25/2024. FINDINGS: Cardiovascular: The heart is normal in size and there is no pericardial effusion. Scattered coronary artery calcifications are noted. A TAVR stent is in place. There is atherosclerotic calcification of the aorta without evidence of aneurysm. There is distension of the pulmonary arteries bilaterally which may be associated with underlying pulmonary artery hypertension. There is no evidence of pulmonary embolism. Mediastinum/Nodes: No mediastinal or axillary lymphadenopathy. Prominent lymph nodes are present at the right hilum measuring up to 1.1 cm. The thyroid  gland, trachea, and esophagus are within normal limits. There is a small hiatal hernia. Lungs/Pleura: Paraseptal and centrilobular emphysematous changes are present in the lungs. There is a trace right pleural effusion. Mild airspace disease is noted in the right lower lobe. No pneumothorax is seen. Stable consolidation is noted in the posterior segment of the left upper lobe. There is a nodular opacity in the left upper lobe measuring 5 mm, axial image 39. A stable 1.3 cm ground-glass nodule is noted in the right upper lobe, axial image 32. A stable 1.5 cm ground-glass nodule is noted in the right upper lobe, axial image 46 there is a 5 mm nodule in the right upper lobe, axial image 66, new from the previous exam. Ground-glass nodule is noted in the right upper lobe, axial image 46. There is a stable 4 mm nodule in the right middle lobe, axial image 90. Upper Abdomen: A stable hypodensities are noted in the kidneys, previously  characterized as cysts. No acute abnormality. Musculoskeletal: No acute osseous abnormality. Review of the MIP images confirms the above findings. IMPRESSION: 1. No evidence of pulmonary embolism. 2. Mild airspace disease in the right lower lobe, possible atelectasis or infiltrate. 3. Trace right pleural effusion. 4. Scattered ground-glass opacities and pulmonary nodules bilaterally, concerning for indolent adenocarcinoma. 5. Emphysema. 6. Coronary artery calcifications. 7. Aortic atherosclerosis. Electronically Signed   By: Leita Birmingham M.D.   On: 05/03/2024 15:07   DG Chest Port 1 View Result Date: 05/03/2024 CLINICAL DATA:  Shortness of breath, fatigue and cough productive of green sputum. EXAM: PORTABLE CHEST 1 VIEW COMPARISON:  06/08/2019 FINDINGS: Heart size and mediastinal contours are normal. Status post TAVR. Prominent bilateral pulmonary arteries. No pleural fluid: Interstitial edema, or airspace consolidation. Visualized osseous structures are unremarkable. IMPRESSION: 1. No acute cardiopulmonary  abnormalities. 2. Prominent bilateral pulmonary arteries. Correlate for any clinical signs or symptoms of pulmonary arterial hypertension. Electronically Signed   By: Waddell Calk M.D.   On: 05/03/2024 11:29   MR Brain W Wo Contrast Result Date: 04/30/2024 EXAM DESCRIPTION: MR BRAIN W WO CONTRAST CLINICAL HISTORY: Transient ischemic attack (TIA) COMPARISON: None available TECHNIQUE: Non contrast MRI of the brain is performed according to our usual protocol including multiplanar multi sequence technique. FINDINGS: No acute intracranial hemorrhage, mass, edema, or hydrocephalus. Mild cortical atrophy. Small chronic infarct in the right cerebellum. Mild to moderate white matter disease suggesting chronic small vessel ischemic change. The vascular flow voids are unremarkable. No significant sinus disease. IMPRESSION: Age-related change without acute intracranial pathology. Small chronic infarct right  cerebellum. Electronically signed by: Reyes Frees MD 04/30/2024 02:27 AM EDT RP Workstation: [id number]   ECHOCARDIOGRAM COMPLETE Result Date: 04/16/2024    ECHOCARDIOGRAM REPORT   Patient Name:   FRANCIS YARDLEY Date of Exam: 04/16/2024 Medical Rec #:  [id number]       Height:       66.0 in Accession #:    [id number]      Weight:       163.4 lb Date of Birth:  1941-10-22       BSA:          1.835 m Patient Age:    82 years        BP:           136/70 mmHg Patient Gender: F               HR:           63 bpm. Exam Location:  Outpatient Procedure: 3D Echo, 2D Echo, Strain Analysis, Color Doppler and Cardiac Doppler            (Both Spectral and Color Flow Doppler were utilized during            procedure). Indications:    TIA  History:        Patient has prior history of Echocardiogram examinations, most                 recent 10/05/2024. CAD, TIA; Risk Factors:Former Smoker,                 Dyslipidemia and Hypertension. Thoracic ascending aortic                 aneurysm; NSVT;TAVR 26 mm Medtronic CoreValve Evolut Pro on                 12/2018.  Sonographer:    Orvil Holmes RDCS Referring Phys: [id number] JOSE E PAZ IMPRESSIONS  1. Left ventricular ejection fraction, by estimation, is 55 to 60%. The left ventricle has normal function. The left ventricle has no regional wall motion abnormalities. Left ventricular diastolic parameters were normal.  2. Right ventricular systolic function is normal. The right ventricular size is normal.  3. The mitral valve is normal in structure. Mild mitral valve regurgitation.  4. S/p TAVR(Medtronic Core Valve Evolue Pro, 26 mm), procedure date 12/30/18)     Peak and mean gradeitns through the valve are 17 and 9 mm Hg respectively AVA (VTI) is 1.2 cm2. DVI is 0.47 Very mild perivalvular regurgitation.     Compared to echo from 2023, mean gradient is mildly increased (4 to 9 mm Hg) . The aortic valve has been repaired/replaced. Aortic valve regurgitation is mild.  5. The inferior vena  cava is normal in size with greater than 50% respiratory variability, suggesting right atrial pressure of 3 mmHg. FINDINGS  Left Ventricle: Left ventricular ejection fraction, by estimation, is 55 to 60%. The left ventricle has normal function. The left ventricle has no regional wall motion abnormalities. Global longitudinal strain performed but not reported based on interpreter judgement due to suboptimal tracking. The left ventricular internal cavity size was normal in size. There is no left ventricular hypertrophy. Left ventricular diastolic parameters were normal. Right Ventricle: The right ventricular size is normal. Right vetricular wall thickness was not assessed. Right ventricular systolic function is normal. Left Atrium: Left atrial size was normal in size. Right Atrium: Right atrial size was normal in size. Pericardium: There is no evidence of pericardial effusion. Mitral Valve: The mitral valve is normal in structure. Mild mitral valve regurgitation. Tricuspid Valve: The tricuspid valve is normal in structure. Tricuspid valve regurgitation is mild. Aortic Valve: S/p TAVR(Medtronic Core Valve Evolue Pro, 26 mm), procedure date 12/30/18) Peak and mean gradeitns through the valve are 17 and 9 mm Hg respectively AVA (VTI) is 1.2 cm2. DVI is 0.47 Very mild perivalvular regurgitation. Compared to echo from 2023, mean gradient is mildly increased (4 to 9 mm Hg). The aortic valve has been repaired/replaced. Aortic valve regurgitation is mild. Aortic valve mean gradient measures 9.0 mmHg. Aortic valve peak gradient measures 17.0 mmHg. Aortic valve area, by VTI measures 1.19 cm. Pulmonic Valve: The pulmonic valve was not well visualized. Pulmonic valve regurgitation is not visualized. No evidence of pulmonic stenosis. Aorta: The aortic root and ascending aorta are structurally normal, with no evidence of dilitation. Venous: The inferior vena cava is normal in size with greater than 50% respiratory variability,  suggesting right atrial pressure of 3 mmHg. IAS/Shunts: No atrial level shunt detected by color flow Doppler.  LEFT VENTRICLE PLAX 2D LVIDd:         4.34 cm   Diastology LVIDs:         3.18 cm   LV e' medial:    7.18 cm/s LV PW:         0.74 cm   LV E/e' medial:  12.0 LV IVS:        0.86 cm   LV e' lateral:   11.20 cm/s LVOT diam:     1.80 cm   LV E/e' lateral: 7.7 LV SV:         61 LV SV Index:   33 LVOT Area:     2.54 cm                           3D Volume EF:                          3D EF:        56 %                          LV EDV:       92 ml                          LV ESV:       41 ml                          LV SV:  51 ml RIGHT VENTRICLE RV S prime:     9.36 cm/s TAPSE (M-mode): 2.0 cm LEFT ATRIUM             Index        RIGHT ATRIUM           Index LA diam:        3.40 cm 1.85 cm/m   RA Area:     13.60 cm LA Vol (A2C):   56.5 ml 30.79 ml/m  RA Volume:   35.40 ml  19.29 ml/m LA Vol (A4C):   32.9 ml 17.93 ml/m LA Biplane Vol: 46.5 ml 25.34 ml/m  AORTIC VALVE AV Area (Vmax):    1.15 cm AV Area (Vmean):   1.13 cm AV Area (VTI):     1.19 cm AV Vmax:           206.00 cm/s AV Vmean:          139.000 cm/s AV VTI:            0.511 m AV Peak Grad:      17.0 mmHg AV Mean Grad:      9.0 mmHg LVOT Vmax:         93.30 cm/s LVOT Vmean:        61.800 cm/s LVOT VTI:          0.239 m LVOT/AV VTI ratio: 0.47  AORTA Ao Root diam: 1.90 cm Ao Asc diam:  3.70 cm MITRAL VALVE               TRICUSPID VALVE MV Area (PHT): 2.87 cm    TR Peak grad:   36.5 mmHg MV Decel Time: 264 msec    TR Vmax:        302.00 cm/s MR Peak grad: 15.4 mmHg MR Mean grad: 46.0 mmHg    SHUNTS MR Vmax:      196.25 cm/s  Systemic VTI:  0.24 m MR Vmean:     336.0 cm/s   Systemic Diam: 1.80 cm MV E velocity: 85.90 cm/s MV A velocity: 84.90 cm/s MV E/A ratio:  1.01 Vina Gull MD Electronically signed by Vina Gull MD Signature Date/Time: 04/16/2024/2:05:52 PM    Final    CT HEAD WO CONTRAST ( ) Result Date: 04/07/2024 CLINICAL DATA:   Provided history: Transient ischemic attack (TIA). Additional history provided: Vision loss in left eye. EXAM: CT HEAD WITHOUT CONTRAST TECHNIQUE: Contiguous axial images were obtained from the base of the skull through the vertex without intravenous contrast. RADIATION DOSE REDUCTION: This exam was performed according to the departmental dose-optimization program which includes automated exposure control, adjustment of the mA and/or kV according to patient size and/or use of iterative reconstruction technique. COMPARISON:  Brain MRI 06/30/2019. MRA head 06/30/2019. Head CT 05/25/2019. FINDINGS: Brain: Generalized cerebral atrophy. Patchy and ill-defined hypoattenuation within the cerebral white matter, nonspecific but compatible with moderate chronic small vessel ischemic disease. Small chronic infarcts within the cerebellum, some of which were better appreciated on the prior brain MRI of 06/30/2019. This includes a chronic infarct within the right cerebellar hemisphere which is new from the prior MRI (series 2, image 6). There is no acute intracranial hemorrhage. No demarcated cortical infarct. No extra-axial fluid collection. No evidence of an intracranial mass. No midline shift. Vascular: No hyperdense vessel. Atherosclerotic calcifications. Skull: No calvarial fracture or aggressive osseous lesion. Visible sinuses/orbits: No mass or acute finding within the imaged orbits. Mild left sphenoid sinusitis. IMPRESSION: 1. No evidence of an acute intracranial  abnormality. 2. Moderate chronic small vessel ischemic changes within the cerebral white matter. 3. Multiple small chronic infarcts within the cerebellum, some of which were better appreciated on the prior MRI of 06/30/2019. This includes a chronic infarct within the right cerebellar hemisphere which is new from this prior exam. 4. Cerebral atrophy. 5. Mild left sphenoid sinusitis. Electronically Signed   By: Rockey Childs D.O.   On: 04/07/2024 16:27     Microbiology: Results for orders placed or performed during the hospital encounter of 05/03/24  Resp panel by RT-PCR (RSV, Flu A&B, Covid) Anterior Nasal Swab     Status: None   Collection Time: 05/03/24 11:15 AM   Specimen: Anterior Nasal Swab  Result Value Ref Range Status   SARS Coronavirus 2 by RT PCR NEGATIVE NEGATIVE Final    Comment: (NOTE) SARS-CoV-2 target nucleic acids are NOT DETECTED.  The SARS-CoV-2 RNA is generally detectable in upper respiratory specimens during the acute phase of infection. The lowest concentration of SARS-CoV-2 viral copies this assay can detect is 138 copies/mL. A negative result does not preclude SARS-Cov-2 infection and should not be used as the sole basis for treatment or other patient management decisions. A negative result may occur with  improper specimen collection/handling, submission of specimen other than nasopharyngeal swab, presence of viral mutation(s) within the areas targeted by this assay, and inadequate number of viral copies(<138 copies/mL). A negative result must be combined with clinical observations, patient history, and epidemiological information. The expected result is Negative.  Fact Sheet for Patients:  BloggerCourse.com  Fact Sheet for Healthcare Providers:  SeriousBroker.it  This test is no t yet approved or cleared by the [place]  FDA and  has been authorized for detection and/or diagnosis of SARS-CoV-2 by FDA under an Emergency Use Authorization (EUA). This EUA will remain  in effect (meaning this test can be used) for the duration of the COVID-19 declaration under Section 564(b)(1) of the Act, 21 U.S.C.section 360bbb-3(b)(1), unless the authorization is terminated  or revoked sooner.       Influenza A by PCR NEGATIVE NEGATIVE Final   Influenza B by PCR NEGATIVE NEGATIVE Final    Comment: (NOTE) The Xpert Xpress SARS-CoV-2/FLU/RSV plus assay is  intended as an aid in the diagnosis of influenza from Nasopharyngeal swab specimens and should not be used as a sole basis for treatment. Nasal washings and aspirates are unacceptable for Xpert Xpress SARS-CoV-2/FLU/RSV testing.  Fact Sheet for Patients: BloggerCourse.com  Fact Sheet for Healthcare Providers: SeriousBroker.it  This test is not yet approved or cleared by the [place]  FDA and has been authorized for detection and/or diagnosis of SARS-CoV-2 by FDA under an Emergency Use Authorization (EUA). This EUA will remain in effect (meaning this test can be used) for the duration of the COVID-19 declaration under Section 564(b)(1) of the Act, 21 U.S.C. section 360bbb-3(b)(1), unless the authorization is terminated or revoked.     Resp Syncytial Virus by PCR NEGATIVE NEGATIVE Final    Comment: (NOTE) Fact Sheet for Patients: BloggerCourse.com  Fact Sheet for Healthcare Providers: SeriousBroker.it  This test is not yet approved or cleared by the [place]  FDA and has been authorized for detection and/or diagnosis of SARS-CoV-2 by FDA under an Emergency Use Authorization (EUA). This EUA will remain in effect (meaning this test can be used) for the duration of the COVID-19 declaration under Section 564(b)(1) of the Act, 21 U.S.C. section 360bbb-3(b)(1), unless the authorization is terminated or revoked.  Performed at [facility],  [address] [place], [place] [address]     Labs: CBC: Recent Labs  Lab 05/03/24 1209 05/03/24 1221 05/04/24 0526  WBC 11.8*  --  10.8*  HGB 13.3 13.6 12.7  HCT 40.1 40.0 38.5  MCV 93.3  --  94.6  PLT 286  --  254   Basic Metabolic Panel: Recent Labs  Lab 05/03/24 1209 05/03/24 1221 05/04/24 0526  NA 139 138 138  K 3.8 3.8 3.7  CL 99  --  104  CO2 29  --  25  GLUCOSE 97  --  100*  BUN 16  --  18   CREATININE 0.74  --  0.70  CALCIUM  9.4  --  8.3*  MG  --   --  1.8  PHOS  --   --  3.0   Liver Function Tests: Recent Labs  Lab 05/03/24 1209 05/04/24 0526  AST 23 21  ALT 19 17  ALKPHOS 59 40  BILITOT 0.4 0.7  PROT 7.1 6.1*  ALBUMIN 3.9 2.8*   CBG: No results for input(s): GLUCAP in the last 168 hours.  Discharge time spent: greater than 40 minutes.  Signed: Adriana DELENA Grams, MD [facility] 05/05/2024

## 2024-05-05 NOTE — Progress Notes (Signed)
   05/05/24 1246  TOC Brief Assessment  Insurance and Status Reviewed  Patient has primary care physician Yes  Home environment has been reviewed home  Prior level of function: independent  Prior/Current Home Services No current home services  Social Drivers of Health Review SDOH reviewed no interventions necessary  Readmission risk has been reviewed Yes  Transition of care needs no transition of care needs at this time

## 2024-05-06 ENCOUNTER — Telehealth: Payer: Self-pay

## 2024-05-06 ENCOUNTER — Ambulatory Visit

## 2024-05-06 NOTE — Transitions of Care (Post Inpatient/ED Visit) (Signed)
   05/06/2024  Name: Dominique [name] MRN: [id number] DOB: [birth year]  Today's TOC FU Call Status: Today's TOC FU Call Status:: Unsuccessful Call (1st Attempt) Unsuccessful Call (1st Attempt) Date: 05/06/24  Attempted to reach the patient regarding the most recent Inpatient/ED visit.  Follow Up Plan: Additional outreach attempts will be made to reach the patient to complete the Transitions of Care (Post Inpatient/ED visit) call.   Medford Balboa, BSN, RN [place]  VBCI - [employer] [facility] RN Care Manager [phone]

## 2024-05-06 NOTE — Transitions of Care (Post Inpatient/ED Visit) (Signed)
 05/06/2024  Name: Dominique [name] MRN: [id number] DOB: 1941/02/12  Today's TOC FU Call Status: Today's TOC FU Call Status:: Successful TOC FU Call Completed TOC FU Call Complete Date: 05/06/24 Patient's Name and Date of Birth confirmed.  Transition Care Management Follow-up Telephone Call Date of Discharge: 05/05/24 Discharge Facility: [facility]) Type of Discharge: Inpatient Admission Primary Inpatient Discharge Diagnosis:: Community acquired Pneumonia How have you been since you were released from the hospital?: Better Any questions or concerns?: Yes Patient Questions/Concerns:: Needs Prolia  injection, refill on Flonase, prescription for amoxicillin  for dental appointment, results of CT Angio Patient Questions/Concerns Addressed: Notified Provider of Patient Questions/Concerns  Items Reviewed: Did you receive and understand the discharge instructions provided?: Yes Medications obtained,verified, and reconciled?: Yes (Medications Reviewed) Any new allergies since your discharge?: No Dietary orders reviewed?: Yes Type of Diet Ordered:: Low sodium Heart Healthy Do you have support at home?: Yes People in Home [RPT]: alone Name of Support/Comfort Primary Source: [place] [employer]  Medications Reviewed Today: Medications Reviewed Today     Reviewed by Moises Reusing, RN (Case Manager) on 05/06/24 at 1400  Med List Status: <None>   Medication Order Taking? Sig Documenting Provider Last Dose Status Informant  acetaminophen  (TYLENOL ) 325 MG tablet [id number]  Take 2 tablets (650 mg total) by mouth every 6 (six) hours as needed for mild pain (or Fever >/= 101).  Patient not taking: Reported on 05/03/2024   Vicci Burnard SAUNDERS, PA-C  Active Self           Med Note MARISA, NATHANEL LOISE Repress May 03, 2024  8:53 PM)    albuterol  (VENTOLIN  HFA) 108 [phone] Base) MCG/ACT inhaler [id number]  Inhale 2 puffs into the lungs every 6 (six) hours as needed for wheezing or shortness of breath. Ruthell Lauraine FALCON,  NP  Active Self  amLODipine  (NORVASC ) 5 MG tablet [id number]  Take 1 tablet (5 mg total) by mouth daily. Amon Aloysius BRAVO, MD  Active Self  aspirin  EC 81 MG tablet [id number]  Take 81 mg by mouth daily. [provider]  Active Self  budesonide -glycopyrrolate-formoterol  (BREZTRI  AEROSPHERE) 160-9-4.8 MCG/ACT AERO inhaler [id number]  Inhale 2 puffs into the lungs in the morning and at bedtime. Ruthell Lauraine FALCON, NP  Consider Medication Status and Discontinue (Duplicate) Self  budesonide -glycopyrrolate-formoterol  (BREZTRI  AEROSPHERE) 160-9-4.8 MCG/ACT AERO inhaler [id number] Yes Inhale 2 puffs into the lungs in the morning and at bedtime. Willette Adriana LABOR, MD  Active   Cholecalciferol (VITAMIN D3) 5000 UNITS CAPS [id number]  Take 5,000 Units by mouth daily.  [provider]  Active Self  denosumab  (PROLIA ) 60 MG/ML SOLN injection [id number] Yes Inject 60 mg into the skin every 6 (six) months. Administer in upper arm, thigh, or abdomen [provider]  Active Self           Med Note (CANTER, KAYLYN D   Wed Apr 06, 2020  2:50 PM)    denosumab  (PROLIA ) injection 60 mg [id number]    Patient taking differently: Inject 60 mg into the skin once. Please verify that patient has been taking calcium  and vitamin D  supplement and CMET within the last 12 months. Do NOT give if calcium  low; contact MD and/or pharmacist to evaluate. Provide patient medication guide if has not received previously.   Paz, Jose E, MD  Active   EPINEPHrine  0.3 mg/0.3 mL IJ SOAJ injection [id number]  Inject 0.3 mg into the muscle as needed for anaphylaxis. [provider]  Active Self  Med Note MARISA, NATHANEL SAILOR   Sun May 03, 2024  8:54 PM)    fexofenadine (ALLEGRA) 180 MG tablet [id number]  Take 180 mg by mouth daily as needed for allergies or rhinitis. [provider]  Active Self  fluticasone (FLONASE) 50 MCG/ACT nasal spray [id number] Yes Place 1 spray into both nostrils at bedtime. [provider]  Active Self  furosemide  (LASIX ) 20 MG tablet [id number]  DAILY AS NEEDED FOR SWELLING  Patient not taking: Reported on 05/03/2024   Raford Riggs, MD  Active Self  guaiFENesin  (MUCINEX ) 600 MG 12 hr tablet [id number] Yes Take 1 tablet (600 mg total) by mouth 2 (two) times daily for 5 days. Willette Adriana LABOR, MD  Active   levofloxacin  (LEVAQUIN ) 750 MG tablet [id number] Yes Take 1 tablet (750 mg total) by mouth daily for 3 more days. Willette Adriana LABOR, MD  Active   magnesium  oxide (MAG-OX) 400 (240 Mg) MG tablet [id number]  TAKE 1 TABLET BY MOUTH EVERY DAY  Patient taking differently: Take 400 mg by mouth daily.   Vannie Reche RAMAN, NP  Active Self  methylPREDNISolone  (MEDROL  DOSEPAK) 4 MG TBPK tablet [id number] Yes Medrol  Dosepak take as instructed Shahmehdi, Adriana LABOR, MD  Active   metoprolol  succinate (TOPROL -XL) 25 MG 24 hr tablet [id number]  TAKE 1 TABLET BY MOUTH EVERY DAY Raford Riggs, MD  Active Self  Multiple Vitamin (MULTIVITAMIN WITH MINERALS) TABS tablet [id number]  Take 1 tablet by mouth daily with breakfast. [provider]  Active Self  OXYGEN  [id number]  Inhale 2 L/min into the lungs at bedtime. [provider]  Active Self           Med Note MARISA, NATHANEL SAILOR Repress May 03, 2024  8:54 PM)    rosuvastatin  (CRESTOR ) 40 MG tablet [id number]  TAKE 1 TABLET BY MOUTH EVERY DAY Raford Riggs, MD  Active Self            Home Care and Equipment/Supplies: Were Home Health Services Ordered?: No Any new equipment or medical supplies ordered?: No  Functional Questionnaire: Do you need assistance with bathing/showering or dressing?: No Do you need assistance with meal preparation?: No Do you need assistance with eating?: No Do you have difficulty maintaining continence: No Do you need assistance with getting out of bed/getting out of a chair/moving?: No Do you have difficulty managing or taking your medications?: No  Follow up appointments reviewed: PCP  Follow-up appointment confirmed?: Yes Date of PCP follow-up appointment?: 05/08/24 Follow-up Provider: [facility] Follow-up appointment confirmed?: Yes Date of Specialist follow-up appointment?: 05/25/24 Follow-Up Specialty Provider:: Reche Vannie Do you understand care options if your condition(s) worsen?: Yes-patient verbalized understanding  SDOH Interventions Today    Flowsheet Row Most Recent Value  SDOH Interventions   Food Insecurity Interventions Intervention Not Indicated  Housing Interventions Intervention Not Indicated  Transportation Interventions Intervention Not Indicated  Utilities Interventions Intervention Not Indicated    Medford Balboa, BSN, RN [place]  VBCI - [facility] RN Care Manager [phone]

## 2024-05-08 ENCOUNTER — Ambulatory Visit: Admitting: Internal Medicine

## 2024-05-08 VITALS — BP 122/80 | HR 68 | Temp 97.5°F | Resp 17 | Ht 66.0 in | Wt 165.0 lb

## 2024-05-08 DIAGNOSIS — J189 Pneumonia, unspecified organism: Secondary | ICD-10-CM | POA: Diagnosis not present

## 2024-05-08 DIAGNOSIS — I10 Essential (primary) hypertension: Secondary | ICD-10-CM | POA: Diagnosis not present

## 2024-05-08 DIAGNOSIS — Z8673 Personal history of transient ischemic attack (TIA), and cerebral infarction without residual deficits: Secondary | ICD-10-CM

## 2024-05-08 DIAGNOSIS — G459 Transient cerebral ischemic attack, unspecified: Secondary | ICD-10-CM

## 2024-05-08 DIAGNOSIS — J449 Chronic obstructive pulmonary disease, unspecified: Secondary | ICD-10-CM | POA: Diagnosis not present

## 2024-05-08 DIAGNOSIS — J9611 Chronic respiratory failure with hypoxia: Secondary | ICD-10-CM

## 2024-05-08 MED ORDER — FLUTICASONE PROPIONATE 50 MCG/ACT NA SUSP: 1.0000 | Freq: Every day | NASAL | 1 refills | Status: DC

## 2024-05-08 NOTE — Patient Instructions (Addendum)
   Check the  blood pressure regularly Blood pressure goal:  between 110/65 and  135/85. If it is consistently higher or lower, let me know     Come back to the office in 10 days.  Arrange for a nurse visit Will check your blood and  provide a Prolia  shot.   See you 07/08/2024.  Call sooner if needed.

## 2024-05-08 NOTE — Progress Notes (Signed)
 Subjective:    Patient ID: Dominique [name], female    DOB: Nov 04, 1940, 83 y.o.   MRN: [id number]  DOS:  05/08/2024 Type of visit - description: Hospital follow-up, here with her daughter  Admitted to the hospital and discharge 05/05/2024. Presented with 1 week of cough, difficulty breathing and productive cough. Hypotensive upon arrival. Was dx w/ Acute on chronic respiratory failure with hypoxia due to pneumonia, received IV antibiotics, subsequently p.o. antibiotics and steroids.  Since she left the hospital is back home. Feels better. Finished  antibiotics. No fever DOE: Improved, no DOE with ADLs. Cough improved, decreased mucus production. No nausea vomiting.  No diarrhea.   Review of Systems See above   Past Medical History:  Diagnosis Date   Allergy     Atrial fibrillation (HCC)    a. dx 03/2019 while in hospital with cholecystitis   COPD (chronic obstructive pulmonary disease) (HCC)    Critical lower limb ischemia (HCC)    a. after TAVR developed ischemic L leg likely due to flap/closure, s/p emergent repair of left femoral artery with endarterectomy and Dacron patch angioplasty.   Emphysema lung (HCC)    Emphysema of lung (HCC)    GERD (gastroesophageal reflux disease)    Heart murmur    Hyperlipidemia    LDL goal < 70   Hypertension    Hypomagnesemia    Normal coronary arteries 10/2018   NSVT (nonsustained ventricular tachycardia) (HCC) 03/02/2019   Osteoporosis    Oxygen  deficiency    Pulmonary HTN (HCC) 06/05/2016   Moderate with PASP by echo 04/2016 likely Group 3 from COPD and possibly Group 2 from pulmonary venous HTN associated with moderate AS - f/u echo 12/2018 post TAVR showed normal RVSP   Pulmonary nodules    seen on pre TAVR CT, needs 12 month CT follow up   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 s/p TAVR 12/2018.   Thoracic ascending aortic aneurysm (HCC)    needs yearly follow up   Thyroid  nodule 11/08/2022    Past Surgical History:  Procedure Laterality Date   APPENDECTOMY  1951   CARDIAC VALVE REPLACEMENT  01/26/19   CATARACT EXTRACTION     CESAREAN SECTION     1971   CHOLECYSTECTOMY N/A 04/28/2019   Procedure: LAPAROSCOPIC CHOLECYSTECTOMY;  Surgeon: Aron Shoulders, MD;  Location: [facility] ORS;  Service: General;  Laterality: N/A;   COLONOSCOPY  2018   EYE SURGERY     FEMORAL-POPLITEAL BYPASS GRAFT Left 12/31/2018   Procedure: PATCH ANGIOPLASTY REPAIR LEFT FEMORAL ARTERY USING HEMASHIELD PLATINUM FINESSE PATCH;  Surgeon: Oris Krystal FALCON, MD;  Location: [facility] OR;  Service: Vascular;  Laterality: Left;   IR RADIOLOGY PERIPHERAL GUIDED IV START  05/30/2017   IR RADIOLOGY PERIPHERAL GUIDED IV START  09/02/2018   IR RADIOLOGY PERIPHERAL GUIDED IV START  09/02/2018   IR US  GUIDE VASC ACCESS LEFT  09/02/2018   IR US  GUIDE VASC ACCESS RIGHT  05/30/2017   IR US  GUIDE VASC ACCESS RIGHT  09/02/2018   JOINT REPLACEMENT  2016   sacroplasty   LAMINECTOMY     RIGHT/LEFT HEART CATH AND CORONARY ANGIOGRAPHY N/A 11/27/2018   Procedure: RIGHT/LEFT HEART CATH AND CORONARY ANGIOGRAPHY;  Surgeon: Wonda Sharper, MD;  Location: [facility] INVASIVE CV LAB;  Service: Cardiovascular;  Laterality: N/A;  TEE WITHOUT CARDIOVERSION N/A 12/30/2018   Procedure: TRANSESOPHAGEAL ECHOCARDIOGRAM (TEE);  Surgeon: Wonda Sharper, MD;  Location: [facility] INVASIVE CV LAB;  Service: Open Heart Surgery;  Laterality: N/A;   TOTAL HIP ARTHROPLASTY Bilateral    TRANSCATHETER AORTIC VALVE REPLACEMENT, TRANSFEMORAL N/A 12/30/2018   Procedure: TRANSCATHETER AORTIC VALVE REPLACEMENT, TRANSFEMORAL;  Surgeon: Wonda Sharper, MD;  Location: [facility] INVASIVE CV LAB;  Service: Open Heart Surgery;  Laterality: N/A;   TUBAL LIGATION  1975    Current Outpatient Medications  Medication Instructions   acetaminophen  (TYLENOL ) 650 mg,  Oral, Every 6 hours PRN   albuterol  (VENTOLIN  HFA) 108 (90 Base) MCG/ACT inhaler 2 puffs, Inhalation, Every 6 hours PRN   amLODipine  (NORVASC ) 5 mg, Oral, Daily   aspirin  EC 81 mg, Daily   budesonide -glycopyrrolate-formoterol  (BREZTRI  AEROSPHERE) 160-9-4.8 MCG/ACT AERO inhaler 2 puffs, Inhalation, 2 times daily   denosumab  (PROLIA ) 60 mg, Every 6 months   EPINEPHrine  (EPI-PEN) 0.3 mg, As needed   fexofenadine (ALLEGRA) 180 mg, Daily PRN   fluticasone  (FLONASE ) 50 MCG/ACT nasal spray 1 spray, Each Nare, Daily at bedtime   furosemide  (LASIX ) 20 MG tablet DAILY AS NEEDED FOR SWELLING   guaiFENesin  (MUCINEX ) 600 mg, Oral, 2 times daily   magnesium  oxide (MAG-OX) 400 mg, Oral, Daily   methylPREDNISolone  (MEDROL  DOSEPAK) 4 MG TBPK tablet Medrol  Dosepak take as instructed   metoprolol  succinate (TOPROL -XL) 25 mg, Oral, Daily   Multiple Vitamin (MULTIVITAMIN WITH MINERALS) TABS tablet 1 tablet, Daily with breakfast   OXYGEN  2 L/min, Daily at bedtime   rosuvastatin  (CRESTOR ) 40 mg, Oral, Daily   Vitamin D3 5,000 Units, Daily       Objective:   Physical Exam BP 122/80 (BP Location: Right Arm, Patient Position: Sitting, Cuff Size: Small)   Pulse 68   Temp (!) 97.5 F (36.4 C) (Oral)   Resp 17   Ht 5' 6 (1.676 m)   Wt 165 lb (74.8 kg)   SpO2 94%   BMI 26.63 kg/m  General:   Well developed, NAD, BMI noted.  On oxygen  nasal cannula. HEENT:  Normocephalic . Face symmetric, atraumatic Lungs:  Slightly decreased breath sounds but clear Normal respiratory effort, no intercostal retractions, no accessory muscle use. Heart: RRR, + systolic murmur.  Lower extremities: no pretibial edema bilaterally  Skin: Not pale. Not jaundice Neurologic:  alert & oriented X3.  Speech normal, gait appropriate for age and unassisted Psych--  Cognition and judgment appear intact.  Cooperative with normal attention span and concentration.  Behavior appropriate. No anxious or depressed appearing.       Assessment    Assessment Hyperglycemia HTN Hyperlipidemia PULM: -Emphysema, quit tobacco 2006 --Nocturnal O2 as needed ----Pulmonary nodules- possibly cancer.  Declined bx, prefers surveillance ----enlarged R P.A. and Ao CV: -S/p TVAR 12/2018 -Paroxysmal A. fib DX 6/28/ 2020 in the context of acute cholecystitis, converted to NSR, not anticoagulated   -12/31/2018 ischemic left leg after TEVAR, endarterectomy performed --Nonobstructive CAD (per coronary CTA) -Aspirin  indefinitely, SBE prophylaxis with amoxicillin  -MSK --DJD --Sacral Fx, sacral insufficiency, s/p sacroplasty (radiology) 07-2015 --H/o osteoporosis :  took fosamax while in [place] , Fosamax rx again by ortho after sacral Fx 07-2015, never took it, first Prolia  11-14-2015 --T score 08-2015 >> normal (likely normal d/t DJD?) H/o vit D def: Normal level 09-2015  Thyroid  nodules- per Dr Tommas , s/p bx 01/2023      PLAN  Acute on chronic respiratory failure due to CAP: Status post admission, finish outpatient antibiotic, still taking some  prednisone . Clinically improved. Chest x-ray upon admission was negative, did have mild airspace disease at the RLL on CT. Plan: Continue present care, BMP and CBC in 10 days.  Patient plans to reach out to pulmonary and get a follow-up Emphysema: See above, used to be on oxygen  at night, now on oxygen  24/7. YUW:Rlmmzwuob on metoprolol , amlodipine , Lasix  as needed (has not taken it recently).  During last admission, HCTZ stopped likely because she was hypotensive.  BPs at home in the 130s.  Plan: Continue monitoring BPs, if they increase let me know, may need to go back on HCTZ. TIA/amaurosis fugax: See my last note, saw neurology 04/21/2024, a brain and neck CT angiogram done on 04/27/2024, results pending, will contact neurology.  No further symptoms. Osteoporosis: Due to Prolia , will get a nurse visit for 10 days for labs and Prolia . RTC scheduled for 07/08/2024

## 2024-05-08 NOTE — Assessment & Plan Note (Signed)
 Acute on chronic respiratory failure due to CAP: Status post admission, finish outpatient antibiotic, still taking some prednisone . Clinically improved. Chest x-ray upon admission was negative, did have mild airspace disease at the RLL on CT. Plan: Continue present care, BMP and CBC in 10 days.  Patient plans to reach out to pulmonary and get a follow-up Emphysema: See above, used to be on oxygen  at night, now on oxygen  24/7. YUW:Rlmmzwuob on metoprolol , amlodipine , Lasix  as needed (has not taken it recently).  During last admission, HCTZ stopped likely because she was hypotensive.  BPs at home in the 130s.  Plan: Continue monitoring BPs, if they increase let me know, may need to go back on HCTZ. TIA/amaurosis fugax: See my last note, saw neurology 04/21/2024, a brain and neck CT angiogram done on 04/27/2024, results pending, will contact neurology.  No further symptoms. Osteoporosis: Due to Prolia , will get a nurse visit for 10 days for labs and Prolia . RTC scheduled for 07/08/2024

## 2024-05-14 ENCOUNTER — Telehealth: Payer: Self-pay

## 2024-05-14 DIAGNOSIS — J449 Chronic obstructive pulmonary disease, unspecified: Secondary | ICD-10-CM

## 2024-05-14 MED ORDER — BREZTRI AEROSPHERE 160-9-4.8 MCG/ACT IN AERO: 2.0000 | INHALATION_SPRAY | Freq: Two times a day (BID) | RESPIRATORY_TRACT | 8 refills | Status: DC

## 2024-05-14 NOTE — Telephone Encounter (Signed)
 Copied from CRM [id number]. Topic: Clinical - Prescription Issue >> Apr 20, 2024 12:34 PM Dominique [name] wrote: Reason for CRM: Patient states she's been waiting for [name] call back in regard to her Rosina - states she's completely out and would like to know if there are any samples. Requesting [name] call back.   Callback number: [phone] >> May 14, 2024 11:15 AM Dominique [name] wrote: Pt has spoken with [facility], they have not received the order for Breztri . Please resend order to them  Requested call back from nurse  CB# [phone]   Pt states she needs [name] RX of Breztri  sent in to [facility] so she can get her RX for free. Pt states she only has 1 sample left. I informed pt that I would send in [name] RX today. Pt verbalized understanding. NFN.

## 2024-05-19 NOTE — Telephone Encounter (Signed)
 Called and there was no answer. LVM and advised the patient that the radiology report had not been read and we reached out and they read asap. I will route this to Dr Rosemarie for him to review and as soon as he does they will be in touch with results.

## 2024-05-19 NOTE — Telephone Encounter (Signed)
 Pt called wanting to know when she will be called with the results. She states it has been almost a month that she has been waiting on the results. Please advise.

## 2024-05-20 ENCOUNTER — Ambulatory Visit: Payer: Self-pay | Admitting: Neurology

## 2024-05-21 ENCOUNTER — Other Ambulatory Visit (INDEPENDENT_AMBULATORY_CARE_PROVIDER_SITE_OTHER)

## 2024-05-21 ENCOUNTER — Ambulatory Visit (INDEPENDENT_AMBULATORY_CARE_PROVIDER_SITE_OTHER)

## 2024-05-21 DIAGNOSIS — J189 Pneumonia, unspecified organism: Secondary | ICD-10-CM

## 2024-05-21 DIAGNOSIS — M810 Age-related osteoporosis without current pathological fracture: Secondary | ICD-10-CM

## 2024-05-21 DIAGNOSIS — J9611 Chronic respiratory failure with hypoxia: Secondary | ICD-10-CM | POA: Diagnosis not present

## 2024-05-21 LAB — BASIC METABOLIC PANEL WITH GFR
BUN: 9 mg/dL (ref 6–23)
CO2: 30 meq/L (ref 19–32)
Calcium: 8.8 mg/dL (ref 8.4–10.5)
Chloride: 106 meq/L (ref 96–112)
Creatinine, Ser: 0.62 mg/dL (ref 0.40–1.20)
GFR: 81.98 mL/min (ref >60.00)
Glucose, Bld: 90 mg/dL (ref 70–99)
Potassium: 4.2 meq/L (ref 3.5–5.1)
Sodium: 141 meq/L (ref 135–145)

## 2024-05-21 LAB — CBC WITH DIFFERENTIAL/PLATELET
Basophils Absolute: 0.1 K/uL (ref 0.0–0.1)
Basophils Relative: 0.9 % (ref 0.0–3.0)
Eosinophils Absolute: 0.3 K/uL (ref 0.0–0.7)
Eosinophils Relative: 3.3 % (ref 0.0–5.0)
HCT: 39.3 % (ref 36.0–46.0)
Hemoglobin: 12.9 g/dL (ref 12.0–15.0)
Lymphocytes Relative: 16.2 % (ref 12.0–46.0)
Lymphs Abs: 1.3 K/uL (ref 0.7–4.0)
MCHC: 32.8 g/dL (ref 30.0–36.0)
MCV: 94 fl (ref 78.0–100.0)
Monocytes Absolute: 0.7 K/uL (ref 0.1–1.0)
Monocytes Relative: 8.5 % (ref 3.0–12.0)
Neutro Abs: 5.6 K/uL (ref 1.4–7.7)
Neutrophils Relative %: 71.1 % (ref 43.0–77.0)
Platelets: 243 K/uL (ref 150.0–400.0)
RBC: 4.18 Mil/uL (ref 3.87–5.11)
RDW: 14.3 % (ref 11.5–15.5)
WBC: 7.9 K/uL (ref 4.0–10.5)

## 2024-05-21 MED ORDER — DENOSUMAB 60 MG/ML ~~LOC~~ SOSY: 60.0000 mg | PREFILLED_SYRINGE | SUBCUTANEOUS | Status: AC

## 2024-05-21 NOTE — Progress Notes (Signed)
 Pt was in office today for Prolia  injection, injection was given in L arm subcutaneous. Pt tolerated well.

## 2024-05-22 ENCOUNTER — Telehealth: Payer: Self-pay

## 2024-05-22 NOTE — Telephone Encounter (Signed)
 Copied from CRM [id number]. Topic: Appointments - Scheduling Inquiry for Clinic >> May 11, 2024  3:10 PM Celestine FALCON wrote: Reason for CRM: Pt requested a follow up appt after visiting the hospital on 7/6 dc on 7/8 for pneumonia. I found appts in August with NP Lauraine Lites, but patient wanted a sooner appt. I took a look at providers on her care team, but she did not want to be seen in [place]. Please call the pt back at [phone] ok to leave a vm. >> May 14, 2024 11:12 AM Russell PARAS wrote: Pt left message for call back on 07/14 and had not received any contact from clinic. Scheduled pt for 08/08 w/[facility], soonest avail. She is not able to travel to another clinic location with sooner avail. She was discharged from hospital on 07/08 and needs sooner appt if possible, with understanding Hospital FU's are scheduled within 2 weeks of discharge.   CB#  [phone]  That is the soonest appointment we have.I will call if something opens up sooner.

## 2024-05-22 NOTE — Telephone Encounter (Signed)
 Copied from CRM [id number]. Topic: Clinical - Medical Advice >> May 22, 2024 10:18 AM Chasity T wrote: Reason for CRM: Patient is calling to speak with Dr Amon regarding her recent mini stroke and still has questions regarding what to do or what's going on. Please contact patient back at [phone]

## 2024-05-22 NOTE — Telephone Encounter (Signed)
 Can wait until Dr. Amon return  Patient wants to know what is next steps for her.  She feels like she is back at the beginning.  She has had the CT that Dr. Rosemarie ordered and did not show any big blockages and to continue same medications. Pt advised that Dr. Amon is out of the office and she will await until return.

## 2024-05-25 ENCOUNTER — Other Ambulatory Visit (HOSPITAL_COMMUNITY): Payer: Self-pay

## 2024-05-25 ENCOUNTER — Ambulatory Visit (HOSPITAL_BASED_OUTPATIENT_CLINIC_OR_DEPARTMENT_OTHER): Admitting: Family

## 2024-05-25 ENCOUNTER — Encounter (HOSPITAL_BASED_OUTPATIENT_CLINIC_OR_DEPARTMENT_OTHER): Payer: Self-pay | Admitting: Family

## 2024-05-25 ENCOUNTER — Telehealth: Payer: Self-pay | Admitting: Pharmacy Technician

## 2024-05-25 VITALS — BP 126/72 | HR 74 | Ht 66.0 in | Wt 163.0 lb

## 2024-05-25 DIAGNOSIS — E785 Hyperlipidemia, unspecified: Secondary | ICD-10-CM

## 2024-05-25 DIAGNOSIS — I2510 Atherosclerotic heart disease of native coronary artery without angina pectoris: Secondary | ICD-10-CM

## 2024-05-25 DIAGNOSIS — I10 Essential (primary) hypertension: Secondary | ICD-10-CM | POA: Diagnosis not present

## 2024-05-25 DIAGNOSIS — Z952 Presence of prosthetic heart valve: Secondary | ICD-10-CM | POA: Diagnosis not present

## 2024-05-25 DIAGNOSIS — Z8673 Personal history of transient ischemic attack (TIA), and cerebral infarction without residual deficits: Secondary | ICD-10-CM | POA: Diagnosis not present

## 2024-05-25 DIAGNOSIS — I6523 Occlusion and stenosis of bilateral carotid arteries: Secondary | ICD-10-CM

## 2024-05-25 MED ORDER — REPATHA SURECLICK 140 MG/ML ~~LOC~~ SOAJ: 140.0000 mg | SUBCUTANEOUS | 6 refills | Status: DC

## 2024-05-25 NOTE — Patient Instructions (Addendum)
 Medication Instructions:  Continue your current medications.  *If you need a refill on your cardiac medications before your next appointment, please call your pharmacy*  Follow-Up: At [facility], you and your health needs are our priority.  As part of our continuing mission to provide you with exceptional heart care, our providers are all part of one team.  This team includes your primary Cardiologist (physician) and Advanced Practice Providers or APPs (Physician Assistants and Nurse Practitioners) who all work together to provide you with the care you need, when you need it.  Your next appointment:   January 2026  Provider:   Annabella Scarce, MD, Rosaline Bane, NP, or Reche Finder, NP    We recommend signing up for the patient portal called MyChart.  Sign up information is provided on this After Visit Summary.  MyChart is used to connect with patients for Virtual Visits (Telemedicine).  Patients are able to view lab/test results, encounter notes, upcoming appointments, etc.  Non-urgent messages can be sent to your provider as well.   To learn more about what you can do with MyChart, go to [email or web address].   Other Instructions  We will reach out when we have your monitor results.   We will route a message our pharmacy team to see if Repatha  (every 2 week injection) or Leqvio (every 6 month injection) of PCSK9 inhibitor medication is better covered by your insurance to help lower LDL to less than 55 to help reduce risk of stroke.   Exercises to do While Sitting Warm-up Before starting other exercises: Sit up as straight as you can. Have your knees bent at 90 degrees, which is the shape of the capital letter L. Keep your feet flat on the floor. Sit at the front edge of your chair, if you can. Pull in (tighten) the muscles in your abdomen and stretch your spine and neck as straight as you can. Hold this position for a few minutes. Breathe in and out evenly. Try  to concentrate on your breathing, and relax your mind.  Stretching Exercise A: Arm stretch Hold your arms out straight in front of your body. Bend your hands at the wrist with your fingers pointing up, as if signaling someone to stop. Notice the slight tension in your forearms as you hold the position. Keeping your arms out and your hands bent, rotate your hands outward as far as you can and hold this stretch. Aim to have your thumbs pointing up and your pinkie fingers pointing down. Slowly repeat arm stretches for one minute as tolerated. Exercise B: Leg stretch If you can move your legs, try to draw letters on the floor with the toes of your foot. Write your name with one foot. Write your name with the toes of your other foot. Slowly repeat the movements for one minute as tolerated. Exercise C: Reach for the sky Reach your hands as far over your head as you can to stretch your spine. Move your hands and arms as if you are climbing a rope. Slowly repeat the movements for one minute as tolerated.  Range of motion exercises Exercise A: Shoulder roll Let your arms hang loosely at your sides. Lift just your shoulders up toward your ears, then let them relax back down. When your shoulders feel loose, rotate your shoulders in backward and forward circles. Do shoulder rolls slowly for one minute as tolerated. Exercise B: March in place As if you are marching, pump your arms and lift your legs up  and down. Lift your knees as high as you can. If you are unable to lift your knees, just pump your arms and move your ankles and feet up and down. March in place for one minute as tolerated. Exercise C: Seated jumping jacks Let your arms hang down straight. Keeping your arms straight, lift them up over your head. Aim to point your fingers to the ceiling. While you lift your arms, straighten your legs and slide your heels along the floor to your sides, as wide as you can. As you bring your arms back  down to your sides, slide your legs back together. If you are unable to use your legs, just move your arms. Slowly repeat seated jumping jacks for one minute as tolerated.  Strengthening exercises Exercise A: Shoulder squeeze Hold your arms straight out from your body to your sides, with your elbows bent and your fists pointed at the ceiling. Keeping your arms in the bent position, move them forward so your elbows and forearms meet in front of your face. Open your arms back out as wide as you can with your elbows still bent, until you feel your shoulder blades squeezing together. Hold for 5 seconds. Slowly repeat the movements forward and backward for one minute as tolerated.

## 2024-05-25 NOTE — Telephone Encounter (Signed)
 Pharmacy Patient Advocate Encounter  Received notification from [employer] that Prior Authorization for Repatha  has been APPROVED from 10/30/23 to 05/25/25. Unable to obtain price due to refill too soon rejection, last fill date 05/25/24 next available fill date10/03/25 Just filled today  PA #/Case ID/Reference #: [id number]

## 2024-05-25 NOTE — Progress Notes (Signed)
 Cardiology Office Note   Date:  05/25/2024  ID:  Dominique, [name] [birth year], MRN [id number] PCP: Amon Aloysius BRAVO, MD  [facility] Providers Cardiologist:  Annabella Scarce, MD     History of Present Illness Dominique [name] is a 83 y.o. female with history of severe aortic stenosis s/p TAVR 12/2018, nonobstructive CAD, mild carotid stenosis, hyperlipidemia, SVT, moderate TR, mild pulmonary hypertension, hypertension, severe COPD, PAF.    TAVR 12/2018 complicated by ischemic left leg requiring emergent OR for repair of left femoral artery with endarterectomy and dacryon patch and angioplasty.  During hospitalization 03/2019 for laparoscopic cholecystectomy complicated by atrial fibrillation felt to be due to acute illness and not started on long-term OAC.  Seen 05/2019 by PCP with concerning symptoms for TIA.  Bilateral carotid Dopplers 60 to 79% right ICA stenosis and 1 to 39% left stenosis.  Repeat carotid Dopplers 02/2020 mild bilateral stenosis bilaterally.  Monitor 06/2020 due to palpitations with several runs of SVT longest 7 beats.  Updated carotid 05/2023 mild bilateral stenosis.  Home blood pressure cuff noted to be 10 points higher than office cuff.  10/2023 lipid panel total cholesterol 161, triglycerides 59, HDL 79, LDL 70.  Transient episode of vision loss 03/2024 noted by PCP concerning for TIA, 30 day monitor placed but result not yet available. Echo 04/16/24 normal LVEF 55-60%, no RWMA, normal diastolic parameters, bilateral atria normal in size, mild MR/TR, TAVR functioning well with mild perivalvular regurgitation mean gradient . CTA head/neck 04/27/24 bilateral 20% ICA stenosis.   Admitted 7/6-05/05/24 with community acquired pneumonia.   Presents today for follow up independently. Breathing is much better since hospitalization now only using O2 at night. She notes she is tired all the time requiring a nap and going to bed earlier than usual. Encouraged to gradually increase  activity after hospitalization. She just mailed back monitor, discussed we will call her when we have results. Reports mild bilateral blurry vision onset at 83 years old. Episode last week and last night lasting for 10 -20 minutes. She does see her eye doctor regularly and has known cataracts.  She is very concerned understandably about her risk of recurrent stroke and wishes to do anything she can to lower her risk.  ROS: Please see the history of present illness.    All other systems reviewed and are negative.   Studies Reviewed      Cardiac Studies & Procedures   ______________________________________________________________________________________________ CARDIAC CATHETERIZATION  CARDIAC CATHETERIZATION 11/27/2018  Conclusion 1. Angiographically normal coronary arteries (left dominant) 2. Calcified, restricted aortic valve by plain fluoroscopy with hemodynamic findings consistent with moderate aortic stenosis (mean gradient 17 mmHg, AVA 1.47 square cm) 3. Normal right heart pressures  The patient has progressive dyspnea, NYHA III sx's, with echo findings consistent with severe AS. Will proceed with multidisciplinary evaluation of aortic stenosis.  Findings Coronary Findings Diagnostic  Dominance: Left  Left Main Vessel was injected. Vessel is normal in caliber. Vessel is angiographically normal. Short left main, angiographically normal  Left Anterior Descending Vessel was injected. Vessel is normal in caliber. Vessel is angiographically normal. The vessel is tortuous. The LAD is tortuous but otherwise is angiographically normal. It supplies a large first diagonal branch.  Left Circumflex Vessel was injected. Vessel is large. Vessel is angiographically normal. The circumflex is angiographically normal, dominant vessel  Right Coronary Artery Vessel is moderate in size. Vessel is angiographically normal.  Intervention  No interventions have been documented.   STRESS  TESTS  MYOCARDIAL PERFUSION IMAGING 06/18/2016  Interpretation Summary  Nuclear stress EF: 81%.  No T wave inversion was noted during stress.  There was no ST segment deviation noted during stress.  This is a low risk study.  Normal stress perfusion. Globally reduced tracer uptake at rest. No ischemia. LVEF 81%, normal wall motion. This is a low risk study.   ECHOCARDIOGRAM  ECHOCARDIOGRAM COMPLETE 04/16/2024  Narrative ECHOCARDIOGRAM REPORT    Patient Name:   Dominique [name] Date of Exam: 04/16/2024 Medical Rec #:  [id number]       Height:       66.0 in Accession #:    [id number]      Weight:       163.4 lb Date of Birth:  1941/01/05       BSA:          1.835 m Patient Age:    82 years        BP:           136/70 mmHg Patient Gender: F               HR:           63 bpm. Exam Location:  Outpatient  Procedure: 3D Echo, 2D Echo, Strain Analysis, Color Doppler and Cardiac Doppler (Both Spectral and Color Flow Doppler were utilized during procedure).  Indications:    TIA  History:        Patient has prior history of Echocardiogram examinations, most recent 10/05/2024. CAD, TIA; Risk Factors:Former Smoker, Dyslipidemia and Hypertension. Thoracic ascending aortic aneurysm; NSVT;TAVR 26 mm Medtronic CoreValve Evolut Pro on 12/2018.  Sonographer:    Orvil Holmes RDCS Referring Phys: [id number] JOSE E PAZ  IMPRESSIONS   1. Left ventricular ejection fraction, by estimation, is 55 to 60%. The left ventricle has normal function. The left ventricle has no regional wall motion abnormalities. Left ventricular diastolic parameters were normal. 2. Right ventricular systolic function is normal. The right ventricular size is normal. 3. The mitral valve is normal in structure. Mild mitral valve regurgitation. 4. S/p TAVR(Medtronic Core Valve Evolue Pro, 26 mm), procedure date 12/30/18) Peak and mean gradeitns through the valve are 17 and 9 mm Hg respectively AVA (VTI) is 1.2 cm2. DVI is 0.47  Very mild perivalvular regurgitation. Compared to echo from 2023, mean gradient is mildly increased (4 to 9 mm Hg) . The aortic valve has been repaired/replaced. Aortic valve regurgitation is mild. 5. The inferior vena cava is normal in size with greater than 50% respiratory variability, suggesting right atrial pressure of 3 mmHg.  FINDINGS Left Ventricle: Left ventricular ejection fraction, by estimation, is 55 to 60%. The left ventricle has normal function. The left ventricle has no regional wall motion abnormalities. Global longitudinal strain performed but not reported based on interpreter judgement due to suboptimal tracking. The left ventricular internal cavity size was normal in size. There is no left ventricular hypertrophy. Left ventricular diastolic parameters were normal.  Right Ventricle: The right ventricular size is normal. Right vetricular wall thickness was not assessed. Right ventricular systolic function is normal.  Left Atrium: Left atrial size was normal in size.  Right Atrium: Right atrial size was normal in size.  Pericardium: There is no evidence of pericardial effusion.  Mitral Valve: The mitral valve is normal in structure. Mild mitral valve regurgitation.  Tricuspid Valve: The tricuspid valve is normal in structure. Tricuspid valve regurgitation is mild.  Aortic Valve: S/p TAVR(Medtronic Core Valve Evolue Pro, 26 mm), procedure  date 12/30/18) Peak and mean gradeitns through the valve are 17 and 9 mm Hg respectively AVA (VTI) is 1.2 cm2. DVI is 0.47 Very mild perivalvular regurgitation. Compared to echo from 2023, mean gradient is mildly increased (4 to 9 mm Hg). The aortic valve has been repaired/replaced. Aortic valve regurgitation is mild. Aortic valve mean gradient measures 9.0 mmHg. Aortic valve peak gradient measures 17.0 mmHg. Aortic valve area, by VTI measures 1.19 cm.  Pulmonic Valve: The pulmonic valve was not well visualized. Pulmonic valve regurgitation is  not visualized. No evidence of pulmonic stenosis.  Aorta: The aortic root and ascending aorta are structurally normal, with no evidence of dilitation.  Venous: The inferior vena cava is normal in size with greater than 50% respiratory variability, suggesting right atrial pressure of 3 mmHg.  IAS/Shunts: No atrial level shunt detected by color flow Doppler.   LEFT VENTRICLE PLAX 2D LVIDd:         4.34 cm   Diastology LVIDs:         3.18 cm   LV e' medial:    7.18 cm/s LV PW:         0.74 cm   LV E/e' medial:  12.0 LV IVS:        0.86 cm   LV e' lateral:   11.20 cm/s LVOT diam:     1.80 cm   LV E/e' lateral: 7.7 LV SV:         61 LV SV Index:   33 LVOT Area:     2.54 cm  3D Volume EF: 3D EF:        56 % LV EDV:       92 ml LV ESV:       41 ml LV SV:        51 ml  RIGHT VENTRICLE RV S prime:     9.36 cm/s TAPSE (M-mode): 2.0 cm  LEFT ATRIUM             Index        RIGHT ATRIUM           Index LA diam:        3.40 cm 1.85 cm/m   RA Area:     13.60 cm LA Vol (A2C):   56.5 ml 30.79 ml/m  RA Volume:   35.40 ml  19.29 ml/m LA Vol (A4C):   32.9 ml 17.93 ml/m LA Biplane Vol: 46.5 ml 25.34 ml/m AORTIC VALVE AV Area (Vmax):    1.15 cm AV Area (Vmean):   1.13 cm AV Area (VTI):     1.19 cm AV Vmax:           206.00 cm/s AV Vmean:          139.000 cm/s AV VTI:            0.511 m AV Peak Grad:      17.0 mmHg AV Mean Grad:      9.0 mmHg LVOT Vmax:         93.30 cm/s LVOT Vmean:        61.800 cm/s LVOT VTI:          0.239 m LVOT/AV VTI ratio: 0.47  AORTA Ao Root diam: 1.90 cm Ao Asc diam:  3.70 cm  MITRAL VALVE               TRICUSPID VALVE MV Area (PHT): 2.87 cm    TR Peak grad:   36.5 mmHg MV Decel  Time: 264 msec    TR Vmax:        302.00 cm/s MR Peak grad: 15.4 mmHg MR Mean grad: 46.0 mmHg    SHUNTS MR Vmax:      196.25 cm/s  Systemic VTI:  0.24 m MR Vmean:     336.0 cm/s   Systemic Diam: 1.80 cm MV E velocity: 85.90 cm/s MV A velocity: 84.90 cm/s MV E/A  ratio:  1.01  Vina Gull MD Electronically signed by Vina Gull MD Signature Date/Time: 04/16/2024/2:05:52 PM    Final    MONITORS  LONG TERM MONITOR (3-14 DAYS) 08/29/2020  Narrative 14 Day Event Monitor  Quality: Fair.  Baseline artifact. Predominant rhythm: Sinus rhythm Average heart rate: 71 bpm Max heart rate: 141 bpm Min heart rate: 53 bpm Pauses >2.5 seconds: none  Several runs of SVT up to 7 beats.  Fastest rate 141 bpm Occasional PVCs  Tiffany C. Raford, MD, [facility] 09/26/2020 12:51 PM   CT SCANS  CT CORONARY MORPH W/CTA COR W/SCORE 12/09/2018  Addendum 12/10/2018  7:55 AM ADDENDUM REPORT: 12/10/2018 07:52  EXAM: OVER-READ INTERPRETATION  CT CHEST  The following report is an over-read performed by radiologist Dr. Toribio Cove [facility] on 12/10/2018. This over-read does not include interpretation of cardiac or coronary anatomy or pathology. The calcium  score and cardiac CTA interpretation by the cardiologist is attached.  COMPARISON:  Cardiac CTA 09/02/2018.  FINDINGS: Extracardiac findings will be described separately under dictation for contemporaneously obtained CTA chest, abdomen and pelvis.  IMPRESSION: Please see separate dictation for contemporaneously obtained CTA chest, abdomen and pelvis dated 12/09/2018 for full description of relevant extracardiac findings.   Electronically Signed By: Toribio Aye M.D. On: 12/10/2018 07:52  Narrative CLINICAL DATA:  Aortic Stenosis  EXAM: Cardiac TAVR CT  TECHNIQUE: The patient was scanned on a Siemens Force 192 slice scanner. A 120 kV retrospective scan was triggered in the ascending thoracic aorta at 140 HU's. Gantry rotation speed was 250 msecs and collimation was .6 mm. No beta blockade or nitro were given. The 3D data set was reconstructed in 5% intervals of the R-R cycle. Systolic and diastolic phases were analyzed on a dedicated work station using MPR, MIP and  VRT modes. The patient received 80 cc of contrast.  FINDINGS: Aortic Valve: Functionally bicuspid with fusion of left and righ cusps  Aorta: Dilated 3.8 cm normal arch vessels no coarctation  Sino-tubular Junction: 27 mm  Ascending Thoracic Aorta: 38 mm  Aortic Arch: 21 mm  Descending Thoracic Aorta: 23 mm  Sinus of Valsalva Measurements:  Non-coronary: 27.3 mm  Right - coronary: 25.8 mm  Left -   coronary: 27.8 mm  Coronary Artery Height above Annulus:  Left Main: 13.3 mm above annulus  Right Coronary: 11.2 mm above annulus  Virtual Basal Annulus Measurements:  Maximum / Minimum Diameter: 21.3 mm x 19.7 mm  Perimeter: 66 mm  Area: 330 mm2  Coronary Arteries: Sufficient height above annulus for deployment  Optimum Fluoroscopic Angle for Delivery: LAO 17 Caudal 23 degrees  IMPRESSION: 1. Functionally bicuspid AV with annular area of 330 mm2 suitable for a 20 mm Sapien 3 valve  2.  Mild aortic root dilatation 3.8 cm  3.  Coronary arteries sufficient height above annulus for deployment  4. Optimum angiographic angle for deployment LAO 17 degrees Caudal 23 degrees  Maude Emmer  Electronically Signed: By: Maude Emmer M.D. On: 12/09/2018 14:18   CT SCANS  CT CORONARY MORPH W/CTA COR  W/SCORE 09/02/2018  Addendum 09/02/2018  6:12 PM ADDENDUM REPORT: 09/02/2018 18:10  EXAM: Cardiac/Coronary  CT  TECHNIQUE: The patient was scanned on a [employer].  FINDINGS: A 120 kV prospective scan was triggered in the descending thoracic aorta at 111 HU's. Axial non-contrast 3 mm slices were carried out through the heart. The data set was analyzed on a dedicated work station and scored using the Agatson method. Gantry rotation speed was 250 msecs and collimation was .6 mm. No beta blockade and 0.8 mg of sl NTG was given. The 3D data set was reconstructed in 5% intervals of the 67-82 % of the R-R cycle. Diastolic phases were analyzed on a dedicated  work station using MPR, MIP and VRT modes. The patient received 80 cc of contrast.  Aorta: Ascending aorta is mildly dilated (3.8 cm). Mild calcification. No dissection.  Aortic Valve:  Trileaflet.  Mild calcification.  Coronary Arteries:  Normal coronary origin.  Left dominance.  RCA is a non-dominant artery.  There is no plaque.  Left main is a large artery that gives rise to LAD and LCX arteries. There is no plaque.  LAD is a large vessel that has mild (25-49%) mixed calcified and noncalcified plaque proximally at the level of D1.  LCX is a dominant artery that gives rise to two OM branches, PDA and PLA. There is no plaque.  Other findings:  Normal pulmonary vein drainage into the left atrium.  Normal let atrial appendage without a thrombus.  Normal size of the pulmonary artery.  IMPRESSION: 1. Coronary calcium  score of 81.6. This was 49th percentile for age and sex matched control.  2. Normal coronary origin with left dominance.  3.  Non-obstructive CAD in the proximal LAD, unchanged from 05/2017.  Annabella Scarce, MD   Electronically Signed By: Annabella Scarce On: 09/02/2018 18:10  Narrative EXAM: OVER-READ INTERPRETATION  CT CHEST  The following report is an over-read performed by radiologist Dr. Toribio Aye of [facility] on 09/02/2018. This over-read does not include interpretation of cardiac or coronary anatomy or pathology. The coronary calcium  score/coronary CTA interpretation by the cardiologist is attached.  COMPARISON:  None.  FINDINGS: Aortic atherosclerosis. 4 mm right middle lobe pulmonary nodule (axial image 34 of series 15), stable dating back to 06/06/2016, considered definitively benign. Within the visualized portions of the thorax there are no other larger more suspicious appearing pulmonary nodules or masses, there is no acute consolidative airspace disease, no pleural effusions, no pneumothorax and  no lymphadenopathy. Visualized portions of the upper abdomen are unremarkable. There are no aggressive appearing lytic or blastic lesions noted in the visualized portions of the skeleton.  IMPRESSION: 1.  Aortic Atherosclerosis (ICD10-I70.0).  Electronically Signed: By: Toribio Aye M.D. On: 09/02/2018 17:43   CT SCANS  CT CORONARY MORPH W/CTA COR W/SCORE 05/30/2017  Addendum 05/30/2017 11:02 AM ADDENDUM REPORT: 05/30/2017 11:00  CLINICAL DATA:  Chest pain  EXAM: Cardiac CTA  MEDICATIONS: Sub lingual nitro. 4mg  and lopressor  mg  TECHNIQUE: The patient was scanned on a Siemens 192 slice scanner. Gantry rotation speed was 250 msecs. Collimation was.6 mm . An 80 kV prospective scan was triggered in the ascending thoracic aorta at 140 HU's with full mA between 65-75% of the R-R interval. Average HR during the scan was 58 bpm. The 3D data set was interpreted on a dedicated work station using MPR, MIP and VRT modes. A total of 80cc of contrast was used.  FINDINGS: Non-cardiac: See separate report from  [facility]. No significant findings on limited lung and soft tissue windows.  Calcium  Score:  64 with calcium  detected in the proximal LAD  Coronary Arteries:  Left  dominant with no anomalies  LM:  Normal  LAD: Less than 50% calcified plaque in proximal LAD just before the take off of D1  D1: Normal  D2: Normal  Circumflex: Normal  OM1: Normal  OM2: Normal  PDA: Normal  PLA: Normal  RCA:  Non dominant and normal  IMPRESSION: 1) Calcium  score 64 which is 48 th percentile for age and sex  2) Left dominant coronary arteries with less than 50% calcified stenosis in proximal LAD  3) Aortic root dilatation 3.9 cm  4) Aortic and Aortic valve calcification  Maude Emmer   Electronically Signed By: Maude Emmer M.D. On: 05/30/2017 11:00  Narrative EXAM: OVER-READ INTERPRETATION  CT CHEST  The following report is an over-read performed  by radiologist Dr. Franky Leff [facility] on 05/30/2017. This over-read does not include interpretation of cardiac or coronary anatomy or pathology. The coronary CTA interpretation by the cardiologist is attached.  COMPARISON:  None.  FINDINGS: Cardiovascular: Ascending aorta upper limits normal at 3.8 cm. Scattered descending thoracic aortic calcifications. Heart is normal size.  Mediastinum/Nodes: No adenopathy in the lower mediastinum or hila.  Lungs/Pleura: No confluent opacities or effusions.  Upper Abdomen: Imaging into the upper abdomen shows no acute findings.  Musculoskeletal: Chest wall soft tissues are unremarkable. No acute bony abnormality.  IMPRESSION: Ascending aorta upper limits normal in size at 3.8 cm in maximum diameter.  No acute or significant extracardiac abnormality.  Electronically Signed: By: Franky Crease M.D. On: 05/30/2017 09:58     ______________________________________________________________________________________________      Risk Assessment/Calculations           Physical Exam VS:  BP 126/72   Pulse 74   Ht 5' 6 (1.676 m)   Wt 163 lb (73.9 kg)   SpO2 96%   BMI 26.31 kg/m        Wt Readings from Last 3 Encounters:  05/25/24 163 lb (73.9 kg)  05/08/24 165 lb (74.8 kg)  05/03/24 163 lb 12.8 oz (74.3 kg)    GEN: Well nourished, well developed in no acute distress NECK: No JVD; No carotid bruits CARDIAC: RRR, no murmurs, rubs, gallops RESPIRATORY:  Clear to auscultation without rales, wheezing or rhonchi  ABDOMEN: Soft, non-tender, non-distended EXTREMITIES:  No edema; No deformity   ASSESSMENT AND PLAN  S/p TAVR - Valve functioning well on echo 03/2024 with only mild perivalvular regurgitation.  Consider repeat echo 03/2025, can be coordinated at future visit.  Chronic hypoxemia - follows with pulmonology. Wearing O2 at night.  Encouraged her to discuss return to exercise and consideration of pulmonary rehab  at upcoming visit with their team.  HTN - BP well controlled. Continue current antihypertensive regimen.    Nonobstructive CAD - Stable with no anginal symptoms. No indication for ischemic evaluation.  GDMT aspirin  81 mg daily, rosuvastatin  40 mg daily, Toprol  25 mg daily. Recommend aiming for 150 minutes of moderate intensity activity per week and following a heart healthy diet.   HLD, LDL goal <55 - 11/28/23 LDL 70. Given hx of CVA and risk factors of HTN, age, and CAD will plan for LDL goal <55. Will Rx Repatha  140mg  q14 days and send for prior authorization.   Mild carotid stenosis - by CT 04/27/24.  Continue lipid management as above.  Hx of CVA -  MR brain 04/19/24 small chronic infarct right cerebellum. CT head 04/07/24 multiple small chronic infarcts wtihin cerebellum some of which better appreciated on MRI 06/30/19. Chronic infarct within right cerebellar hemisphere new from prior exam.  No long-term sequela of stroke.  Follows with Dr. Rosemarie of neurology.  Management of BP, HLD as above.       Dispo: follow up 10/2024 with Dr. Raford or APP  Signed, Reche GORMAN Finder, NP

## 2024-05-25 NOTE — Telephone Encounter (Signed)
 Pharmacy Patient Advocate Encounter   Received notification from Physician's Office that prior authorization for Repatha  is required/requested.   Insurance verification completed.   The patient is insured through [employer] .   Per test claim: PA required; PA submitted to above mentioned insurance via CoverMyMeds Key/confirmation #/EOC [id number] Status is pending

## 2024-05-25 NOTE — Addendum Note (Signed)
 Addended by: Shandrika Ambers S on: 05/25/2024 04:59 PM   Modules accepted: Orders

## 2024-05-26 ENCOUNTER — Telehealth: Payer: Self-pay | Admitting: Pharmacy Technician

## 2024-05-26 ENCOUNTER — Telehealth: Payer: Self-pay | Admitting: *Deleted

## 2024-05-26 ENCOUNTER — Ambulatory Visit: Payer: Self-pay | Admitting: Family

## 2024-05-26 MED ORDER — BREZTRI AEROSPHERE 160-9-4.8 MCG/ACT IN AERO: 2.0000 | INHALATION_SPRAY | Freq: Two times a day (BID) | RESPIRATORY_TRACT | 8 refills | Status: DC

## 2024-05-26 NOTE — Telephone Encounter (Signed)
 Copied from CRM [id number]. Topic: Clinical - Prescription Issue >> Apr 20, 2024 12:34 PM Isabell A wrote: Reason for CRM: Patient states she's been waiting for a call back in regard to her Rosina - states she's completely out and would like to know if there are any samples. Requesting a call back.   Callback number: [phone] >> May 26, 2024 12:24 PM Russell PARAS wrote: Pt is contacting clinic regarding her Breztri  refill. She reports the refill were to be sent to [facility], so that the medication is covered. She spoke with [facility] this morning and they have still not received the refill request. She reports she is almost out of the medication and stressed urgency. Reviewed chart and advised Ashlyn sent out the refill on 07/17.   Pt requested call back with update concerning this issue  CB#   [phone] >> May 14, 2024 11:15 AM Russell PARAS wrote: Pt has spoken with [facility], they have not received the order for Breztri . Please resend order to them  Requested call back from nurse  CB# [phone]  I am faxing paper script to [facility]. Fax # [phone]. [email or web address]. Sending MyChart message.

## 2024-05-26 NOTE — Telephone Encounter (Signed)
 Dominique [name]

## 2024-05-26 NOTE — Telephone Encounter (Signed)
 Duplicate encounter

## 2024-05-26 NOTE — Telephone Encounter (Signed)
 Already done

## 2024-05-27 MED ORDER — BREZTRI AEROSPHERE 160-9-4.8 MCG/ACT IN AERO: 2.0000 | INHALATION_SPRAY | Freq: Two times a day (BID) | RESPIRATORY_TRACT | Status: DC

## 2024-05-27 NOTE — Addendum Note (Signed)
 Addended by: CLAIR SEVERA MATSU on: 05/27/2024 09:07 AM   Modules accepted: Orders

## 2024-05-27 NOTE — Telephone Encounter (Signed)
 Called and spoke with patient confirming that she will pick up one sample of Breztri  from our office tomorrow, as the prescription was faxed to [facility] yesterday afternoon.

## 2024-06-01 NOTE — Telephone Encounter (Signed)
 Spoke w/ patient, advised that recent CT angiogram showed no acute changes. Neurology recommended continue medical therapy.  Recently cardiology added Repatha  which would be also beneficial given her history of CVAs. She verbalized understanding, will follow-up as planned, sooner if needed.

## 2024-06-05 ENCOUNTER — Ambulatory Visit: Admitting: Acute Care

## 2024-06-05 ENCOUNTER — Encounter: Payer: Self-pay | Admitting: Acute Care

## 2024-06-05 VITALS — BP 154/68 | HR 72 | Ht 66.0 in | Wt 165.6 lb

## 2024-06-05 DIAGNOSIS — Z87891 Personal history of nicotine dependence: Secondary | ICD-10-CM | POA: Diagnosis not present

## 2024-06-05 DIAGNOSIS — J9621 Acute and chronic respiratory failure with hypoxia: Secondary | ICD-10-CM

## 2024-06-05 DIAGNOSIS — J189 Pneumonia, unspecified organism: Secondary | ICD-10-CM

## 2024-06-05 DIAGNOSIS — R9389 Abnormal findings on diagnostic imaging of other specified body structures: Secondary | ICD-10-CM

## 2024-06-05 DIAGNOSIS — I10 Essential (primary) hypertension: Secondary | ICD-10-CM

## 2024-06-05 DIAGNOSIS — J449 Chronic obstructive pulmonary disease, unspecified: Secondary | ICD-10-CM | POA: Diagnosis not present

## 2024-06-05 DIAGNOSIS — R911 Solitary pulmonary nodule: Secondary | ICD-10-CM | POA: Diagnosis not present

## 2024-06-05 MED ORDER — BREZTRI AEROSPHERE 160-9-4.8 MCG/ACT IN AERO
2.0000 | INHALATION_SPRAY | Freq: Two times a day (BID) | RESPIRATORY_TRACT | Status: DC
Start: 2024-06-05 — End: 2024-07-17

## 2024-06-05 NOTE — Progress Notes (Signed)
 History of Present Illness Dominique [name] is a 83 y.o. female former smoker , quit 2005 with  a 72.6 pack year smoking history. She is followed by Dr. Shelah for an abnormal pulmonary nodule, here today for hospital follow up after admission for community acquired pneumonia.    06/05/2024 Discussed the use of AI scribe software for clinical note transcription with the patient, who gave verbal consent to proceed.  History of Present Illness Dominique [name] is an 83 year old former smoker who presents for follow-up after a recent hospitalization for community acquired pneumonia.  She experienced a one-week history of shortness of breath and green sputum prior to her hospitalization from July 6 to July 8. During this period, she had low oxygen  saturations and was diagnosed with community-acquired pneumonia, confirmed by a CT chest and chest x-ray showing an infiltrate. Treatment included Rocephin , azithromycin , oxygen , steroids, nebulizer treatments, and a flutter valve. A COVID-19 test was negative.  She states she has been doing much better. She completed the antibiotics and prednisone  taper  prescribed at discharge. She no longer needs oxygen  during the day, as her saturations are > 94% when she checks them.    Currently, she uses oxygen  only at night, with daytime oxygen  saturation levels around 94%. She notes improvement in symptoms over the last three days, with minimal cough and sputum that is light yellow or white. No fever is present, and she reports weight gain. She has resumed her usual activities.  She is using a Breztri  inhaler, two puffs in the morning and two in the evening, and is awaiting a  refill as she has been approved for financial assistance through  the manufacturer.   She has a past medical history of a TIA approximately six to eight weeks ago, for which she underwent various tests. Her blood pressure was high today, and hydrochlorothiazide  was discontinued during her  hospital stay. She has a blood pressure cuff at home and plans to monitor her levels. I have asked her to call her PCP or cardiologist if her BP remains elevated at home as her medication may need to be resumed now that she is over her pneumonia.  She recalls a CT scan in April 2025 that showed a stable nodule, and she is scheduled for a follow-up scan in October to monitor this finding.She will follow up with me after the scan to review the results.     Test Results: CTA 05/03/2024 Mediastinum/Nodes: No mediastinal or axillary lymphadenopathy. Prominent lymph nodes are present at the right hilum measuring up to 1.1 cm. The thyroid  gland, trachea, and esophagus are within normal limits. There is a small hiatal hernia.   Lungs/Pleura: Paraseptal and centrilobular emphysematous changes are present in the lungs. There is a trace right pleural effusion. Mild airspace disease is noted in the right lower lobe. No pneumothorax is seen. Stable consolidation is noted in the posterior segment of the left upper lobe. There is a nodular opacity in the left upper lobe measuring 5 mm, axial image 39. A stable 1.3 cm ground-glass nodule is noted in the right upper lobe, axial image 32. A stable 1.5 cm ground-glass nodule is noted in the right upper lobe, axial image 46 there is a 5 mm nodule in the right upper lobe, axial image 66, new from the previous exam. Ground-glass nodule is noted in the right upper lobe, axial image 46. There is a stable 4 mm nodule in the right middle lobe, axial image 90. No evidence  of pulmonary embolism. 2. Mild airspace disease in the right lower lobe, possible atelectasis or infiltrate. 3. Trace right pleural effusion. 4. Scattered ground-glass opacities and pulmonary nodules bilaterally, concerning for indolent adenocarcinoma. 5. Emphysema. 6. Coronary artery calcifications. 7. Aortic atherosclerosis.  05/03/2024 CXR Heart size and mediastinal contours are normal.  Status post TAVR. Prominent bilateral pulmonary arteries. No pleural fluid: Interstitial edema, or airspace consolidation. Visualized osseous structures are unremarkable.   IMPRESSION: 1. No acute cardiopulmonary abnormalities. 2. Prominent bilateral pulmonary arteries. Correlate for any clinical signs or symptoms of pulmonary arterial hypertension.     Latest Ref Rng & Units 05/21/2024   10:43 AM 05/04/2024    5:26 AM 05/03/2024   12:21 PM  CBC  WBC 4.0 - 10.5 K/uL 7.9  10.8    Hemoglobin 12.0 - 15.0 g/dL 87.0  87.2  86.3   Hematocrit 36.0 - 46.0 % 39.3  38.5  40.0   Platelets 150.0 - 400.0 K/uL 243.0  254         Latest Ref Rng & Units 05/21/2024   10:43 AM 05/04/2024    5:26 AM 05/03/2024   12:21 PM  BMP  Glucose 70 - 99 mg/dL 90  899    BUN 6 - 23 mg/dL 9  18    Creatinine 9.59 - 1.20 mg/dL 9.37  9.29    Sodium 864 - 145 mEq/L 141  138  138   Potassium 3.5 - 5.1 mEq/L 4.2  3.7  3.8   Chloride 96 - 112 mEq/L 106  104    CO2 19 - 32 mEq/L 30  25    Calcium  8.4 - 10.5 mg/dL 8.8  8.3      BNP    Component Value Date/Time   BNP 67.0 09/02/2020 1237   BNP 65.3 12/26/2018 1015    ProBNP No results found for: PROBNP  PFT    Component Value Date/Time   FEV1PRE 1.06 12/09/2018 1404   FEV1POST 1.07 12/09/2018 1404   FVCPRE 1.77 12/09/2018 1404   FVCPOST 2.17 12/09/2018 1404   TLC 5.43 12/09/2018 1404   DLCOUNC 8.82 12/09/2018 1404   PREFEV1FVCRT 60 12/09/2018 1404   PSTFEV1FVCRT 49 12/09/2018 1404    No results found.   Past medical hx Past Medical History:  Diagnosis Date   Allergy     Atrial fibrillation (HCC)    a. dx 03/2019 while in hospital with cholecystitis   COPD (chronic obstructive pulmonary disease) (HCC)    Critical lower limb ischemia (HCC)    a. after TAVR developed ischemic L leg likely due to flap/closure, s/p emergent repair of left femoral artery with endarterectomy and Dacron patch angioplasty.   Emphysema lung (HCC)    Emphysema of lung  (HCC)    GERD (gastroesophageal reflux disease)    Heart murmur    Hyperlipidemia    LDL goal < 70   Hypertension    Hypomagnesemia    Normal coronary arteries 10/2018   NSVT (nonsustained ventricular tachycardia) (HCC) 03/02/2019   Osteoporosis    Oxygen  deficiency    Pulmonary HTN (HCC) 06/05/2016   Moderate with PASP by echo 04/2016 likely Group 3 from COPD and possibly Group 2 from pulmonary venous HTN associated with moderate AS - f/u echo 12/2018 post TAVR showed normal RVSP   Pulmonary nodules    seen on pre TAVR CT, needs 12 month CT follow up   S/P TAVR (transcatheter aortic valve replacement) 12/30/2018   Medtronic Evolut Pro-Plus THV (size 26 mm,  serial # [id number]) via the TF approach   Sacral fracture (HCC)    Severe aortic stenosis    a. s/p TAVR 12/2018.   Thoracic ascending aortic aneurysm (HCC)    needs yearly follow up   Thyroid  nodule 11/08/2022     Social History   Tobacco Use   Smoking status: Former    Current packs/day: 0.00    Average packs/day: 1.5 packs/day for 48.4 years (72.6 ttl pk-yrs)    Types: Cigarettes    Start date: 21    Quit date: 03/19/2004    Years since quitting: 20.2   Smokeless tobacco: Never   Tobacco comments:    Started smoking at age 89  Vaping Use   Vaping status: Never Used  Substance Use Topics   Alcohol use: Yes    Alcohol/week: 3.0 - 4.0 standard drinks of alcohol    Types: 3 - 4 Glasses of wine per week    Comment: socially   Drug use: No    Ms.Lieser reports that she quit smoking about 20 years ago. Her smoking use included cigarettes. She started smoking about 68 years ago. She has a 72.6 pack-year smoking history. She has never used smokeless tobacco. She reports current alcohol use of about 3.0 - 4.0 standard drinks of alcohol per week. She reports that she does not use drugs.  Tobacco Cessation: Counseling given: Not Answered Tobacco comments: Started smoking at age 29 Former smoker   Past surgical  hx, Family hx, Social hx all reviewed.  Current Outpatient Medications on File Prior to Visit  Medication Sig   albuterol  (VENTOLIN  HFA) 108 (90 Base) MCG/ACT inhaler Inhale 2 puffs into the lungs every 6 (six) hours as needed for wheezing or shortness of breath.   amLODipine  (NORVASC ) 5 MG tablet Take 1 tablet (5 mg total) by mouth daily.   aspirin  EC 81 MG tablet Take 81 mg by mouth daily.   budesonide -glycopyrrolate-formoterol  (BREZTRI  AEROSPHERE) 160-9-4.8 MCG/ACT AERO inhaler Inhale 2 puffs into the lungs in the morning and at bedtime.   budesonide -glycopyrrolate-formoterol  (BREZTRI  AEROSPHERE) 160-9-4.8 MCG/ACT AERO inhaler Inhale 2 puffs into the lungs in the morning and at bedtime.   Cholecalciferol (VITAMIN D3) 5000 UNITS CAPS Take 5,000 Units by mouth daily.    denosumab  (PROLIA ) 60 MG/ML SOLN injection Inject 60 mg into the skin every 6 (six) months. Administer in upper arm, thigh, or abdomen   EPINEPHrine  0.3 mg/0.3 mL IJ SOAJ injection Inject 0.3 mg into the muscle as needed for anaphylaxis.   Evolocumab  (REPATHA  SURECLICK) 140 MG/ML SOAJ Inject 140 mg into the skin every 14 (fourteen) days.   fexofenadine (ALLEGRA) 180 MG tablet Take 180 mg by mouth daily as needed for allergies or rhinitis.   fluticasone  (FLONASE ) 50 MCG/ACT nasal spray Place 1 spray into both nostrils at bedtime.   magnesium  oxide (MAG-OX) 400 (240 Mg) MG tablet TAKE 1 TABLET BY MOUTH EVERY DAY   metoprolol  succinate (TOPROL -XL) 25 MG 24 hr tablet TAKE 1 TABLET BY MOUTH EVERY DAY   Multiple Vitamin (MULTIVITAMIN WITH MINERALS) TABS tablet Take 1 tablet by mouth daily with breakfast.   OXYGEN  Inhale 2 L/min into the lungs at bedtime.   rosuvastatin  (CRESTOR ) 40 MG tablet TAKE 1 TABLET BY MOUTH EVERY DAY   furosemide  (LASIX ) 20 MG tablet DAILY AS NEEDED FOR SWELLING (Patient not taking: Reported on 06/05/2024)   Current Facility-Administered Medications on File Prior to Visit  Medication   [START ON 11/17/2024]  denosumab  (PROLIA ) injection 60 mg  No Known Allergies  Review Of Systems:  Constitutional:   No  weight loss, night sweats,  Fevers, chills, fatigue, or  lassitude.  HEENT:   No headaches,  Difficulty swallowing,  Tooth/dental problems, or  Sore throat,                No sneezing, itching, ear ache, nasal congestion, post nasal drip,   CV:  No chest pain,  Orthopnea, PND, swelling in lower extremities, anasarca, dizziness, palpitations, syncope.   GI  No heartburn, indigestion, abdominal pain, nausea, vomiting, diarrhea, change in bowel habits, loss of appetite, bloody stools.   Resp: No shortness of breath with exertion or at rest.  No excess mucus, no productive cough,  No non-productive cough,  No coughing up of blood.  No change in color of mucus.  No wheezing.  No chest wall deformity  Skin: no rash or lesions.  GU: no dysuria, change in color of urine, no urgency or frequency.  No flank pain, no hematuria   MS:  No joint pain or swelling.  No decreased range of motion.  No back pain.  Psych:  No change in mood or affect. No depression or anxiety.  No memory loss.   Vital Signs BP (!) 154/68   Pulse 72   Ht 5' 6 (1.676 m)   Wt 165 lb 9.6 oz (75.1 kg)   SpO2 94% Comment: RA  BMI 26.73 kg/m    Physical Exam:  General- No distress,  A&Ox3, pleasant ENT: No sinus tenderness, TM clear, pale nasal mucosa, no oral exudate,no post nasal drip, no LAN Cardiac: S1, S2, regular rate and rhythm, no murmur Chest: No wheeze/ rales/ dullness; no accessory muscle use, no nasal flaring, no sternal retractions, slightly diminished per bases Abd.: Soft Non-tender, ND, BS +, Body mass index is 26.73 kg/m.  Ext: No clubbing cyanosis, edema, no obvious deformities Neuro:  normal strength, MAE x 4, A&O x 3 Skin: No rashes, warm and dry, no obvious skin lesions  Psych: normal mood and behavior    Assessment & Plan Chronic obstructive pulmonary disease (COPD) with chronic  respiratory failure 2/2/recent pneumonia, chronic a nocturnal hypoxia COPD with chronic respiratory failure and nocturnal hypoxia. Improved breathing, no wheezing or significant cough. Daytime saturations around 94%.No longer needs daytime oxygen  - Continue Breztri  inhaler, two puffs in the morning and evening. Rinse mouth after use. - Use rescue inhaler as needed for shortness of breath or wheezing. - Continue oxygen  at bedtime as needed. - Use oxygen  during the day if levels drop below 88%. - Resend prescription to Breztri  manufacturer and provide samples at reduced cost.  Pulmonary Nodule Pulmonary nodule, left upper lobe, under surveillance Pulmonary nodule in left upper lobe stable in size. No change on previous CT in April. - Perform follow-up CT scan in October. - Review CT results in follow-up appointment.   Community-acquired pneumonia resolved.  Previously treated with Rocephin , azithromycin , oxygen , steroids, and nebulizer treatments. Plan  Follow up Ct Chest 07/2024 Call for fever or discolored secretions, or for worsening dyspnea  Hypertension Hypertension with elevated blood pressure today. Hydrochlorothiazide  was discontinued during hospitalization. - Monitor blood pressure at home. - Consult primary care if blood pressure remains high.  I spent 35 minutes dedicated to the care of this patient on the date of this encounter to include pre-visit review of records, face-to-face time with the patient discussing conditions above, post visit ordering of testing, clinical documentation with the electronic health record, making appropriate referrals  as documented, and communicating necessary information to the patient's healthcare team.      Lauraine JULIANNA Lites, NP 06/05/2024  11:13 AM

## 2024-06-05 NOTE — Patient Instructions (Addendum)
 It is good to see you today. I am glad you have been doing well since your hospitalization. Continue  Breztri  2 puffs in the morning and 2 puffs in the evening. Rinse mouth after use Continue oxygen  as needed at bedtime. Wear during the day if your oxygen  saturations drop below 88%. Monitor your oxygen  saturation levels  I am glad you have been approved for financial assistance with Breztri  We will re send the prescription to the Breztri  manufacturer. We will give some samples today until you get your medication in the mail. Note your daily symptoms > remember red flags for COPD:  Increase in cough, increase in sputum production, increase in shortness of breath or activity intolerance. If you notice these symptoms, please call to be seen.    Follow up after October CT Chest so we can review the results together.  Please schedule 1 week after the scan has been done. Please contact office for sooner follow up if symptoms do not improve or worsen or seek emergency care .

## 2024-06-08 MED ORDER — BREZTRI AEROSPHERE 160-9-4.8 MCG/ACT IN AERO: 2.0000 | INHALATION_SPRAY | Freq: Two times a day (BID) | RESPIRATORY_TRACT | 3 refills | Status: DC

## 2024-06-08 NOTE — Addendum Note (Signed)
 Addended by: MELVENIA WILFORD SAUNDERS on: 06/08/2024 01:57 PM   Modules accepted: Orders

## 2024-06-19 DIAGNOSIS — G453 Amaurosis fugax: Secondary | ICD-10-CM

## 2024-06-30 DIAGNOSIS — E785 Hyperlipidemia, unspecified: Secondary | ICD-10-CM | POA: Diagnosis not present

## 2024-06-30 LAB — LIPID PANEL
Chol/HDL Ratio: 1.4 ratio (ref 0.0–4.4)
Cholesterol, Total: 117 mg/dL (ref 100–199)
HDL: 81 mg/dL (ref >39)
LDL Chol Calc (NIH): 23 mg/dL (ref 0–99)
Triglycerides: 61 mg/dL (ref 0–149)
VLDL Cholesterol Cal: 13 mg/dL (ref 5–40)

## 2024-06-30 LAB — ALT: ALT: 22 IU/L (ref 0–32)

## 2024-07-02 ENCOUNTER — Ambulatory Visit (HOSPITAL_BASED_OUTPATIENT_CLINIC_OR_DEPARTMENT_OTHER): Payer: Self-pay | Admitting: Family

## 2024-07-02 ENCOUNTER — Ambulatory Visit: Payer: Self-pay | Admitting: Cardiovascular Disease

## 2024-07-08 ENCOUNTER — Ambulatory Visit: Admitting: Internal Medicine

## 2024-07-14 ENCOUNTER — Telehealth: Payer: Self-pay

## 2024-07-14 MED ORDER — BREZTRI AEROSPHERE 160-9-4.8 MCG/ACT IN AERO: 2.0000 | INHALATION_SPRAY | Freq: Two times a day (BID) | RESPIRATORY_TRACT | 3 refills | Status: AC

## 2024-07-14 NOTE — Telephone Encounter (Signed)
 Copied from CRM #[id number]. Topic: Clinical - Prescription Issue >> Jul 10, 2024  2:12 PM Russell PARAS wrote: Reason for CRM:   Pt is contacting clinic regarding her prescribed Breztri . She has spoken with [facility], and they have not received the new prescription for the medication. She is requesting the new prescription be sent to them. She provided FX# and contact # for the program.  [facility]( Pt's ID: [id number])  CB#  [phone]  FX#  [phone]  Pt requested call back with update once prescription has been sent  CB#  [phone]   I have faxed over that script,attempted to call patient,sent mychart message

## 2024-07-17 ENCOUNTER — Other Ambulatory Visit: Payer: Self-pay

## 2024-07-20 ENCOUNTER — Ambulatory Visit (INDEPENDENT_AMBULATORY_CARE_PROVIDER_SITE_OTHER): Admitting: Internal Medicine

## 2024-07-20 ENCOUNTER — Encounter: Payer: Self-pay | Admitting: Internal Medicine

## 2024-07-20 VITALS — BP 134/70 | HR 68 | Temp 98.0°F | Resp 20 | Ht 66.0 in | Wt 165.5 lb

## 2024-07-20 DIAGNOSIS — J9611 Chronic respiratory failure with hypoxia: Secondary | ICD-10-CM

## 2024-07-20 DIAGNOSIS — J438 Other emphysema: Secondary | ICD-10-CM

## 2024-07-20 DIAGNOSIS — I2510 Atherosclerotic heart disease of native coronary artery without angina pectoris: Secondary | ICD-10-CM

## 2024-07-20 DIAGNOSIS — Z952 Presence of prosthetic heart valve: Secondary | ICD-10-CM

## 2024-07-20 DIAGNOSIS — I10 Essential (primary) hypertension: Secondary | ICD-10-CM | POA: Diagnosis not present

## 2024-07-20 MED ORDER — HYDROCHLOROTHIAZIDE 12.5 MG PO TABS: 12.5000 mg | ORAL_TABLET | Freq: Every day | ORAL | 1 refills | Status: AC

## 2024-07-20 NOTE — Progress Notes (Signed)
 Subjective:    Patient ID: Dominique [name], female    DOB: 1941-06-25, 83 y.o.   MRN: [id number]  DOS:  07/20/2024 Type of visit - description: Follow-up  Chronic medical problems addressed. Notes from cardiology and pulmonary reviewed. Since the last visit, denies any further episode of left visual field deficits. Denies chest pain. Breathing at baseline. Concerned about capillary fragility, denies any blood in the stools or in the urine  Review of Systems See above   Past Medical History:  Diagnosis Date   Allergy     Atrial fibrillation (HCC)    a. dx 03/2019 while in hospital with cholecystitis   COPD (chronic obstructive pulmonary disease) (HCC)    Critical lower limb ischemia (HCC)    a. after TAVR developed ischemic L leg likely due to flap/closure, s/p emergent repair of left femoral artery with endarterectomy and Dacron patch angioplasty.   Emphysema lung (HCC)    Emphysema of lung (HCC)    GERD (gastroesophageal reflux disease)    Heart murmur    Hyperlipidemia    LDL goal < 70   Hypertension    Hypomagnesemia    Normal coronary arteries 10/2018   NSVT (nonsustained ventricular tachycardia) (HCC) 03/02/2019   Osteoporosis    Oxygen  deficiency    Pulmonary HTN (HCC) 06/05/2016   Moderate with PASP by echo 04/2016 likely Group 3 from COPD and possibly Group 2 from pulmonary venous HTN associated with moderate AS - f/u echo 12/2018 post TAVR showed normal RVSP   Pulmonary nodules    seen on pre TAVR CT, needs 12 month CT follow up   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 s/p TAVR 12/2018.   Thoracic ascending aortic aneurysm (HCC)    needs yearly follow up   Thyroid  nodule 11/08/2022    Past Surgical History:  Procedure Laterality Date   APPENDECTOMY  1951   CARDIAC VALVE REPLACEMENT  01/26/19    CATARACT EXTRACTION     CESAREAN SECTION     1971   CHOLECYSTECTOMY N/A 04/28/2019   Procedure: LAPAROSCOPIC CHOLECYSTECTOMY;  Surgeon: Aron Shoulders, MD;  Location: [facility];  Service: General;  Laterality: N/A;   COLONOSCOPY  2018   EYE SURGERY     FEMORAL-POPLITEAL BYPASS GRAFT Left 12/31/2018   Procedure: PATCH ANGIOPLASTY REPAIR LEFT FEMORAL ARTERY USING HEMASHIELD PLATINUM FINESSE PATCH;  Surgeon: Oris Krystal FALCON, MD;  Location: [facility] OR;  Service: Vascular;  Laterality: Left;   IR RADIOLOGY PERIPHERAL GUIDED IV START  05/30/2017   IR RADIOLOGY PERIPHERAL GUIDED IV START  09/02/2018   IR RADIOLOGY PERIPHERAL GUIDED IV START  09/02/2018   IR US  GUIDE VASC ACCESS LEFT  09/02/2018   IR US  GUIDE VASC ACCESS RIGHT  05/30/2017   IR US  GUIDE VASC ACCESS RIGHT  09/02/2018   JOINT REPLACEMENT  2016   sacroplasty   LAMINECTOMY     RIGHT/LEFT HEART CATH AND CORONARY ANGIOGRAPHY N/A 11/27/2018   Procedure: RIGHT/LEFT HEART CATH AND CORONARY ANGIOGRAPHY;  Surgeon: Wonda Sharper, MD;  Location: [facility] INVASIVE CV LAB;  Service: Cardiovascular;  Laterality: N/A;   TEE WITHOUT CARDIOVERSION N/A 12/30/2018   Procedure: TRANSESOPHAGEAL ECHOCARDIOGRAM (TEE);  Surgeon: Wonda Sharper, MD;  Location: [facility] INVASIVE CV LAB;  Service: Open Heart Surgery;  Laterality: N/A;   TOTAL HIP ARTHROPLASTY Bilateral  TRANSCATHETER AORTIC VALVE REPLACEMENT, TRANSFEMORAL N/A 12/30/2018   Procedure: TRANSCATHETER AORTIC VALVE REPLACEMENT, TRANSFEMORAL;  Surgeon: Wonda Sharper, MD;  Location: [facility] INVASIVE CV LAB;  Service: Open Heart Surgery;  Laterality: N/A;   TUBAL LIGATION  1975    Current Outpatient Medications  Medication Instructions   albuterol  (VENTOLIN  HFA) 108 (90 Base) MCG/ACT inhaler 2 puffs, Inhalation, Every 6 hours PRN   amLODipine  (NORVASC ) 5 mg, Oral, Daily   aspirin  EC 81 mg, Daily   budesonide -glycopyrrolate-formoterol  (BREZTRI  AEROSPHERE) 160-9-4.8 MCG/ACT AERO inhaler 2 puffs, Inhalation, 2 times  daily   denosumab  (PROLIA ) 60 mg, Every 6 months   EPINEPHrine  (EPI-PEN) 0.3 mg, As needed   fexofenadine (ALLEGRA) 180 mg, Daily PRN   fluticasone  (FLONASE ) 50 MCG/ACT nasal spray 1 spray, Each Nare, Daily at bedtime   furosemide  (LASIX ) 20 MG tablet DAILY AS NEEDED FOR SWELLING   hydrochlorothiazide  (HYDRODIURIL ) 12.5 mg, Oral, Daily   magnesium  oxide (MAG-OX) 400 mg, Oral, Daily   metoprolol  succinate (TOPROL -XL) 25 mg, Oral, Daily   Multiple Vitamin (MULTIVITAMIN WITH MINERALS) TABS tablet 1 tablet, Daily with breakfast   OXYGEN  2 L/min, Daily at bedtime   Repatha  SureClick 140 mg, Subcutaneous, Every 14 days   rosuvastatin  (CRESTOR ) 40 mg, Oral, Daily   Vitamin D3 5,000 Units, Daily       Objective:   Physical Exam BP 134/70   Pulse 68   Temp 98 F (36.7 C) (Oral)   Resp 20   Ht 5' 6 (1.676 m)   Wt 165 lb 8 oz (75.1 kg)   SpO2 95%   BMI 26.71 kg/m  General:   Well developed, NAD, BMI noted. HEENT:  Normocephalic . Face symmetric, atraumatic Lungs:  CTA B Normal respiratory effort, no intercostal retractions, no accessory muscle use. Heart: RRR, soft systolic murmur.  Lower extremities: no pretibial edema bilaterally  Skin: At the upper extremities, has few ecchymosis consistent with capillary fragility. Neurologic:  alert & oriented X3.  Speech normal, gait appropriate for age and unassisted Psych--  Cognition and judgment appear intact.  Cooperative with normal attention span and concentration.  Behavior appropriate. No anxious or depressed appearing.      Assessment     Assessment Hyperglycemia HTN Hyperlipidemia PULM: -Emphysema, quit tobacco 2006 --Nocturnal O2 as needed ----Pulmonary nodules- possibly cancer.  Declined bx, prefers surveillance ----enlarged R P.A. and Ao CV: -S/p TVAR 12/2018 -Paroxysmal A. fib DX 6/28/ 2020 in the context of acute cholecystitis, converted to NSR, not anticoagulated   -12/31/2018 ischemic left leg after TEVAR,  endarterectomy performed --Nonobstructive CAD (per coronary CTA) -Aspirin  indefinitely, SBE prophylaxis with amoxicillin  -MSK --DJD --Sacral Fx, sacral insufficiency, s/p sacroplasty (radiology) 07-2015 --H/o osteoporosis :  took fosamax while in [place] , Fosamax rx again by ortho after sacral Fx 07-2015, never took it, first Prolia  11-14-2015 --T score 08-2015 >> normal (likely normal d/t DJD?) H/o vit D def: Normal level 09-2015  Thyroid  nodules- per Dr Tommas , s/p bx 01/2023      PLAN  H/o TIA/amaurosis fugax: See LOV, neurology reviewed the CT angio of the neck-brain from 04/2024, was Rx to continue medical management.  No further left visual field defects. HTN: On amlodipine , metoprolol , has Lasix  but takes it only as needed, has not taken it lately.  Ambulatory BPs has been as high as 140, 150.  Diastolic BP has been as high as 107.  Today is okay at 134/70. Plan: Restart HCTZ 12.5 mg daily, BMP in 2 weeks.  Monitor BPs.  Status post TVAR, paroxysmal A-fib, nonobstructive CAD. Cardiology visit 05/25/2024: Stable from the cardiac standpoint R fragility: Reassured. COPD with chronic respiratory failure, chronic nocturnal hypoxia, pulmonary nodule: Pulmonary visit 06/05/2024, was recommended to continue inhalers, oxygen  at bedtime, daytime if oxygen  drops below 88%.  Next CT chest October 2025. Preventive care: Vaccine advice provided, see AVS. RTC 2 weeks blood work, for months checkup

## 2024-07-20 NOTE — Patient Instructions (Addendum)
 Vaccines I recommend for this fall: Flu shot COVID booster Pneumonia shot (PNM 20 or PNM 21).   Restart hydrochlorothiazide  12.5 mg 1 tablet daily. Check your blood pressure daily. Blood pressure goal:  between 110/65 and  135/85. If it is consistently higher or lower, let me know     Go to the front desk for the checkout Please make an appointment for blood work in 2 weeks Also an appointment for follow-up in 3 to 4 months

## 2024-07-20 NOTE — Assessment & Plan Note (Signed)
 H/o TIA/amaurosis fugax: See LOV, neurology reviewed the CT angio of the neck-brain from 04/2024, was Rx to continue medical management.  No further left visual field defects. HTN: On amlodipine , metoprolol , has Lasix  but takes it only as needed, has not taken it lately.  Ambulatory BPs has been as high as 140, 150.  Diastolic BP has been as high as 107.  Today is okay at 134/70. Plan: Restart HCTZ 12.5 mg daily, BMP in 2 weeks.  Monitor BPs. Status post TVAR, paroxysmal A-fib, nonobstructive CAD. Cardiology visit 05/25/2024: Stable from the cardiac standpoint R fragility: Reassured. COPD with chronic respiratory failure, chronic nocturnal hypoxia, pulmonary nodule: Pulmonary visit 06/05/2024, was recommended to continue inhalers, oxygen  at bedtime, daytime if oxygen  drops below 88%.  Next CT chest October 2025. Preventive care: Vaccine advice provided, see AVS. RTC 2 weeks blood work, for months checkup

## 2024-07-27 DIAGNOSIS — Z01419 Encounter for gynecological examination (general) (routine) without abnormal findings: Secondary | ICD-10-CM | POA: Diagnosis not present

## 2024-07-27 DIAGNOSIS — Z1231 Encounter for screening mammogram for malignant neoplasm of breast: Secondary | ICD-10-CM | POA: Diagnosis not present

## 2024-07-27 DIAGNOSIS — Z6829 Body mass index (BMI) 29.0-29.9, adult: Secondary | ICD-10-CM | POA: Diagnosis not present

## 2024-07-28 NOTE — Telephone Encounter (Signed)
 Attempted to call patient,left message to call back ,called [employer] looks liek this has been escalated and fax was received.

## 2024-07-28 NOTE — Telephone Encounter (Signed)
 Patient is calling regarding her previous message regarding [employer] for prescription for Breztri .  She has 2-3 days left of this medication.  She called [employer] and they said they sent the prescription to the office.  Please call patient to let her know the status.  She really wants to speak with someone because she is a little upset because it's taking so long to get this take care of.  [employer]'s fax number [phone].

## 2024-07-29 NOTE — Telephone Encounter (Signed)
 I called and spoke with the patient, I advised her of the update that [facility] received from [employer].  I let her know that I would put 2 Breztri  samples up front so she would not run out while waiting on patient assistance.  She was very appreciative and verbalized understanding.  Samples placed up front for patient pickup.  Nothing further needed.

## 2024-08-03 ENCOUNTER — Other Ambulatory Visit (INDEPENDENT_AMBULATORY_CARE_PROVIDER_SITE_OTHER)

## 2024-08-03 DIAGNOSIS — I10 Essential (primary) hypertension: Secondary | ICD-10-CM

## 2024-08-03 LAB — BASIC METABOLIC PANEL WITH GFR
BUN: 16 mg/dL (ref 6–23)
CO2: 29 meq/L (ref 19–32)
Calcium: 9.1 mg/dL (ref 8.4–10.5)
Chloride: 101 meq/L (ref 96–112)
Creatinine, Ser: 0.66 mg/dL (ref 0.40–1.20)
GFR: 80.64 mL/min (ref >60.00)
Glucose, Bld: 89 mg/dL (ref 70–99)
Potassium: 4 meq/L (ref 3.5–5.1)
Sodium: 142 meq/L (ref 135–145)

## 2024-08-04 ENCOUNTER — Ambulatory Visit: Payer: Self-pay | Admitting: Internal Medicine

## 2024-08-04 ENCOUNTER — Other Ambulatory Visit: Payer: Self-pay | Admitting: Cardiovascular Disease

## 2024-08-04 DIAGNOSIS — Z23 Encounter for immunization: Secondary | ICD-10-CM | POA: Diagnosis not present

## 2024-08-05 NOTE — Telephone Encounter (Signed)
 Aortic stenosis s/p TAVR-echo 09/2021 normal LVEF, mild AI. Repeat echo 09/2022 normal LVEF, gr1DD, trivial paravalvular AI with overall normal function. Continue SBE prophylaxis amoxicillin . Continue optimal BP control. Consider repeat echo 09/2023   Above was from 12/07/2022 visit with Reche ORN NP Refilled as requested

## 2024-08-05 NOTE — Telephone Encounter (Signed)
 Pt's pharmacy is requesting a refill on medication amoxicillin . This medication is not on pt's medication list. Would Dr. Raford like to prescribe this medication for pt's dental procedures? Please address

## 2024-08-15 ENCOUNTER — Other Ambulatory Visit: Payer: Self-pay | Admitting: Internal Medicine

## 2024-08-28 ENCOUNTER — Ambulatory Visit: Payer: Medicare Other

## 2024-09-03 ENCOUNTER — Ambulatory Visit (HOSPITAL_BASED_OUTPATIENT_CLINIC_OR_DEPARTMENT_OTHER)
Admission: RE | Admit: 2024-09-03 | Discharge: 2024-09-03 | Disposition: A | Discharge status: Home / Self Care | Source: Ambulatory Visit | Attending: Acute Care | Admitting: Acute Care

## 2024-09-03 DIAGNOSIS — R911 Solitary pulmonary nodule: Secondary | ICD-10-CM | POA: Diagnosis not present

## 2024-09-03 DIAGNOSIS — I700 Atherosclerosis of aorta: Secondary | ICD-10-CM | POA: Diagnosis not present

## 2024-09-03 DIAGNOSIS — R918 Other nonspecific abnormal finding of lung field: Secondary | ICD-10-CM | POA: Diagnosis not present

## 2024-09-03 DIAGNOSIS — J432 Centrilobular emphysema: Secondary | ICD-10-CM | POA: Diagnosis not present

## 2024-09-14 ENCOUNTER — Ambulatory Visit

## 2024-09-16 DIAGNOSIS — H1045 Other chronic allergic conjunctivitis: Secondary | ICD-10-CM | POA: Diagnosis not present

## 2024-09-16 DIAGNOSIS — Z961 Presence of intraocular lens: Secondary | ICD-10-CM | POA: Diagnosis not present

## 2024-09-16 DIAGNOSIS — H43393 Other vitreous opacities, bilateral: Secondary | ICD-10-CM | POA: Diagnosis not present

## 2024-09-16 DIAGNOSIS — H31091 Other chorioretinal scars, right eye: Secondary | ICD-10-CM | POA: Diagnosis not present

## 2024-09-16 DIAGNOSIS — H26493 Other secondary cataract, bilateral: Secondary | ICD-10-CM | POA: Diagnosis not present

## 2024-09-18 ENCOUNTER — Encounter: Payer: Self-pay | Admitting: Acute Care

## 2024-09-18 ENCOUNTER — Ambulatory Visit: Admitting: Acute Care

## 2024-09-18 VITALS — BP 141/75 | HR 66 | Temp 97.6°F | Ht 66.0 in | Wt 168.6 lb

## 2024-09-18 DIAGNOSIS — Z87891 Personal history of nicotine dependence: Secondary | ICD-10-CM | POA: Diagnosis not present

## 2024-09-18 DIAGNOSIS — R918 Other nonspecific abnormal finding of lung field: Secondary | ICD-10-CM

## 2024-09-18 DIAGNOSIS — R9389 Abnormal findings on diagnostic imaging of other specified body structures: Secondary | ICD-10-CM

## 2024-09-18 DIAGNOSIS — R911 Solitary pulmonary nodule: Secondary | ICD-10-CM

## 2024-09-18 NOTE — Patient Instructions (Signed)
 It is good to see you today. Your scan shows stable nodules that are still concerning for a slow growing cancer.  We will do a PET scan to better evaluate these nodules.  You will get a call to get this scheduled.  Then you will follow up with me 1-2 weeks after the scan to review results. Call if you need us  sooner. Please contact office for sooner follow up if symptoms do not improve or worsen or seek emergency care

## 2024-09-18 NOTE — Progress Notes (Signed)
 History of Present Illness Dominique [name] is a 83 y.o. female former smoker , quit 2005 ,with a 72.6 pack year smoking history. She is followed by Dr. Shelah for an abnormal pulmonary nodule, which we are following with surveillance imaging.   09/18/2024 Discussed the use of AI scribe software for clinical note transcription with the patient, who gave verbal consent to proceed.  History of Present Illness Dominique [name] is an 83 year old female with stable pulmonary nodules under surveillance who presents for follow-up.  Patient  has a history of pulmonary nodules that have remained stable over time. She is a former smoker . She smoker for 43 years. She has a 72.6 pack year smoking history. The nodules under surveillance include one in the posterior left upper lobe measuring 2.9 by 1.2 cm and another measuring 1.2 by 1.1 cm, both unchanged since the last imaging studies. A previously noted nodule in the right lower lobe has resolved and was suspected to be infectious or inflammatory. .  In July, she was hospitalized for two days due to pneumonia, which required approximately three weeks for recovery. During this period, she experienced shortness of breath.   Per the radiology read, and my personal review, the nodules of concern do have a morphologically suspicious for indolent multifocal adenocarcinoma. Because of this we will repeat PET scan to ensure SUV readings remain low. If SIV has increased we may want to consider moving forward with biopsy vs prophylactic SBRT. She is in agreement with this plan.   No weight loss, hemoptysis, or cough. She feels well.     Test Results: CT chest 09/03/2024  Unchanged pulmonary nodules and opacities, including in the posterior left upper lobe a subsolid nodule abutting and tenting the major fissure measuring 2.9 x 1.2 cm, a ground-glass nodule of the peripheral right upper lobe measuring 1.2 x 1.1 cm, and a ground-glass nodule of the posterior right  upper lobe, abutting and tenting the major fissure measuring 1.8 x 1.7 cm. These are unchanged in comparison to multiple prior examinations dating back to 02/12/2023 and remain morphologically suspicious for indolent multifocal adenocarcinoma. 2. Previously seen new nodule in the posterior right upper lobe is now resolved, consistent with resolved infection or inflammation. 3. Mild underlying centrilobular emphysema. 4. Enlarged left and right pulmonary arteries, as can be seen in pulmonary hypertension. 5. Coronary artery disease.  CTA 05/03/2024 Mediastinum/Nodes: No mediastinal or axillary lymphadenopathy. Prominent lymph nodes are present at the right hilum measuring up to 1.1 cm. The thyroid  gland, trachea, and esophagus are within normal limits. There is a small hiatal hernia.   Lungs/Pleura: Paraseptal and centrilobular emphysematous changes are present in the lungs. There is a trace right pleural effusion. Mild airspace disease is noted in the right lower lobe. No pneumothorax is seen. Stable consolidation is noted in the posterior segment of the left upper lobe. There is a nodular opacity in the left upper lobe measuring 5 mm, axial image 39. A stable 1.3 cm ground-glass nodule is noted in the right upper lobe, axial image 32. A stable 1.5 cm ground-glass nodule is noted in the right upper lobe, axial image 46 there is a 5 mm nodule in the right upper lobe, axial image 66, new from the previous exam. Ground-glass nodule is noted in the right upper lobe, axial image 46. There is a stable 4 mm nodule in the right middle lobe, axial image 90. No evidence of pulmonary embolism. 2. Mild airspace disease in the  right lower lobe, possible atelectasis or infiltrate. 3. Trace right pleural effusion. 4. Scattered ground-glass opacities and pulmonary nodules bilaterally, concerning for indolent adenocarcinoma. 5. Emphysema. 6. Coronary artery calcifications. 7. Aortic atherosclerosis.    05/03/2024 CXR Heart size and mediastinal contours are normal. Status post TAVR. Prominent bilateral pulmonary arteries. No pleural fluid: Interstitial edema, or airspace consolidation. Visualized osseous structures are unremarkable.   IMPRESSION: 1. No acute cardiopulmonary abnormalities. 2. Prominent bilateral pulmonary arteries. Correlate for any clinical signs or symptoms of pulmonary arterial hypertension.  02/25/2024 CT Chest IMPRESSION: 1. 2.9 x 1.1 cm subsolid irregular spiculated nodule in the posterior left upper lobe, not notably changed from last year's studies but has slightly increased in size since 2020 consistent with a slow growing adenocarcinoma. 2. Stable 6 mm ground-glass right middle lobe nodule, new from 2020. 3. Stable ground-glass nodules in the right upper lobe. Adenocarcinoma not excluded. 4. Stable 3 mm solid right middle lobe nodule. 5. Aortic and coronary artery atherosclerosis. 6. Stable dilatation of the left and right main pulmonary arteries. 7. Small hiatal hernia. 8. Osteopenia and degenerative change.  04/2021 PET scan  Non solid nodule within the posterior left upper lobe ablating the oblique fissure measures 2.5 by 1.3 cm with SUV max of 1.52, image 20/8. This is stable in size from 01/27/2020.   Non solid nodule within the periphery of the right upper lobe measures 0.9 cm a with SUV max of 0.87, image 15/8. Also stable from 01/27/2020.   1.6 cm non solid nodule in the posterolateral right upper has an SUV max of 1.07, image 23/8. Stable from 01/27/2020.   The subpleural nodule within the medial left upper lobe measures 4 mm with SUV max of 1.32, image 12/8. Stable from 01/27/2020. This is technically too small to reliably characterize by PET-CT. No new lung nodules.   Incidental CT findings: Aortic atherosclerosis. Status post TAVR. Coronary artery calcifications.   ABDOMEN/PELVIS: No abnormal hypermetabolic activity within the liver,  pancreas, adrenal glands, or spleen. No hypermetabolic lymph nodes in the abdomen or pelvis.   Incidental CT findings: Cholecystectomy. Aortic atherosclerosis. Sigmoid diverticulosis.   SKELETON: No focal hypermetabolic activity to suggest skeletal metastasis.   Incidental CT findings: Marked arthropathic changes with synovial hypertrophy and increased uptake localizes to the right sternoclavicular joint. Status post bilateral sacroplasty. Status post bilateral hip arthroplasty. Remote healed left superior and inferior pubic rami fractures. Lumbar degenerative disc disease.   IMPRESSION: 1. Unchanged appearance of bilateral upper lobe non solid nodules when compared with the previous PET-CT from 01/27/2020. Corresponding SUV max is low level and nonspecific. Consider additional follow-up in 1 year toensure ongoing stability through at least 5 years.     Latest Ref Rng & Units 05/21/2024   10:43 AM 05/04/2024    5:26 AM 05/03/2024   12:21 PM  CBC  WBC 4.0 - 10.5 K/uL 7.9  10.8    Hemoglobin 12.0 - 15.0 g/dL 87.0  87.2  86.3   Hematocrit 36.0 - 46.0 % 39.3  38.5  40.0   Platelets 150.0 - 400.0 K/uL 243.0  254         Latest Ref Rng & Units 08/03/2024   10:43 AM 05/21/2024   10:43 AM 05/04/2024    5:26 AM  BMP  Glucose 70 - 99 mg/dL 89  90  899   BUN 6 - 23 mg/dL 16  9  18    Creatinine 0.40 - 1.20 mg/dL 9.33  9.37  9.29   Sodium 135 -  145 mEq/L 142  141  138   Potassium 3.5 - 5.1 mEq/L 4.0  4.2  3.7   Chloride 96 - 112 mEq/L 101  106  104   CO2 19 - 32 mEq/L 29  30  25    Calcium  8.4 - 10.5 mg/dL 9.1  8.8  8.3     BNP    Component Value Date/Time   BNP 67.0 09/02/2020 1237   BNP 65.3 12/26/2018 1015    ProBNP No results found for: PROBNP  PFT    Component Value Date/Time   FEV1PRE 1.06 12/09/2018 1404   FEV1POST 1.07 12/09/2018 1404   FVCPRE 1.77 12/09/2018 1404   FVCPOST 2.17 12/09/2018 1404   TLC 5.43 12/09/2018 1404   DLCOUNC 8.82 12/09/2018 1404    PREFEV1FVCRT 60 12/09/2018 1404   PSTFEV1FVCRT 49 12/09/2018 1404    CT CHEST WO CONTRAST Result Date: 09/08/2024 CLINICAL DATA:  Multiple lung nodules * Tracking Code: BO * EXAM: CT CHEST WITHOUT CONTRAST TECHNIQUE: Multidetector CT imaging of the chest was performed following the standard protocol without IV contrast. RADIATION DOSE REDUCTION: This exam was performed according to the departmental dose-optimization program which includes automated exposure control, adjustment of the mA and/or kV according to patient size and/or use of iterative reconstruction technique. COMPARISON:  05/03/2024 02/12/2023 FINDINGS: Cardiovascular: Aortic atherosclerosis. Aortic valve stent endograft. Normal heart size. Scattered left coronary artery calcifications. Enlarged left and right pulmonary arteries, measuring up to 3.7 cm in caliber. No pericardial effusion. Mediastinum/Nodes: No enlarged mediastinal, hilar, or axillary lymph nodes. Thyroid  gland, trachea, and esophagus demonstrate no significant findings. Lungs/Pleura: Unchanged pulmonary nodules and opacities, including in the posterior left upper lobe a subsolid nodule abutting and tenting the major fissure measuring 2.9 x 1.2 cm (series 5, image 49), a ground-glass nodule of the peripheral right upper lobe measuring 1.2 x 1.1 cm (series 5, image 39), and a ground-glass nodule of the posterior right upper lobe, abutting and tenting the major fissure measuring 1.8 x 1.7 cm (series 5, image 59). Mild underlying centrilobular emphysema. Previously seen new nodule in the posterior right upper lobe is now resolved. Bandlike scarring of the bilateral lung bases. No pleural effusion or pneumothorax. Upper Abdomen: No acute abnormality.  Cholecystectomy. Musculoskeletal: No chest wall abnormality. No acute osseous findings. IMPRESSION: 1. Unchanged pulmonary nodules and opacities, including in the posterior left upper lobe a subsolid nodule abutting and tenting the major  fissure measuring 2.9 x 1.2 cm, a ground-glass nodule of the peripheral right upper lobe measuring 1.2 x 1.1 cm, and a ground-glass nodule of the posterior right upper lobe, abutting and tenting the major fissure measuring 1.8 x 1.7 cm. These are unchanged in comparison to multiple prior examinations dating back to 02/12/2023 and remain morphologically suspicious for indolent multifocal adenocarcinoma. 2. Previously seen new nodule in the posterior right upper lobe is now resolved, consistent with resolved infection or inflammation. 3. Mild underlying centrilobular emphysema. 4. Enlarged left and right pulmonary arteries, as can be seen in pulmonary hypertension. 5. Coronary artery disease. Aortic Atherosclerosis (ICD10-I70.0) and Emphysema (ICD10-J43.9). Electronically Signed   By: Marolyn JONETTA Jaksch M.D.   On: 09/08/2024 14:20     Past medical hx Past Medical History:  Diagnosis Date   Allergy     Atrial fibrillation (HCC)    a. dx 03/2019 while in hospital with cholecystitis   COPD (chronic obstructive pulmonary disease) (HCC)    Critical lower limb ischemia (HCC)    a. after TAVR developed ischemic L leg  likely due to flap/closure, s/p emergent repair of left femoral artery with endarterectomy and Dacron patch angioplasty.   Emphysema lung (HCC)    Emphysema of lung (HCC)    GERD (gastroesophageal reflux disease)    Heart murmur    Hyperlipidemia    LDL goal < 70   Hypertension    Hypomagnesemia    Normal coronary arteries 10/2018   NSVT (nonsustained ventricular tachycardia) (HCC) 03/02/2019   Osteoporosis    Oxygen  deficiency    Pulmonary HTN (HCC) 06/05/2016   Moderate with PASP by echo 04/2016 likely Group 3 from COPD and possibly Group 2 from pulmonary venous HTN associated with moderate AS - f/u echo 12/2018 post TAVR showed normal RVSP   Pulmonary nodules    seen on pre TAVR CT, needs 12 month CT follow up   S/P TAVR (transcatheter aortic valve replacement) 12/30/2018    Medtronic Evolut Pro-Plus THV (size 26 mm, serial # [id number]) via the TF approach   Sacral fracture (HCC)    Severe aortic stenosis    a. s/p TAVR 12/2018.   Thoracic ascending aortic aneurysm    needs yearly follow up   Thyroid  nodule 11/08/2022     Social History   Tobacco Use   Smoking status: Former    Current packs/day: 0.00    Average packs/day: 1.5 packs/day for 48.4 years (72.6 ttl pk-yrs)    Types: Cigarettes    Start date: 32    Quit date: 03/19/2004    Years since quitting: 20.5   Smokeless tobacco: Never   Tobacco comments:    Started smoking at age 54  Vaping Use   Vaping status: Never Used  Substance Use Topics   Alcohol use: Yes    Alcohol/week: 3.0 - 4.0 standard drinks of alcohol    Types: 3 - 4 Glasses of wine per week    Comment: socially   Drug use: No    Ms.Dray reports that she quit smoking about 20 years ago. Her smoking use included cigarettes. She started smoking about 68 years ago. She has a 72.6 pack-year smoking history. She has never used smokeless tobacco. She reports current alcohol use of about 3.0 - 4.0 standard drinks of alcohol per week. She reports that she does not use drugs.  Tobacco Cessation: Counseling given: Not Answered Tobacco comments: Started smoking at age 11 Former smoker , quit 2005 with a 72.6 pack year smoking history  Past surgical hx, Family hx, Social hx all reviewed.  Current Outpatient Medications on File Prior to Visit  Medication Sig   albuterol  (VENTOLIN  HFA) 108 (90 Base) MCG/ACT inhaler Inhale 2 puffs into the lungs every 6 (six) hours as needed for wheezing or shortness of breath.   amLODipine  (NORVASC ) 5 MG tablet Take 1 tablet (5 mg total) by mouth daily.   amoxicillin  (AMOXIL ) 500 MG tablet TAKE 4 TABLETS (2,000MG ) ONE HOUR PRIOR TO ALL DENTAL VISITS.   aspirin  EC 81 MG tablet Take 81 mg by mouth daily.   budesonide -glycopyrrolate-formoterol  (BREZTRI  AEROSPHERE) 160-9-4.8 MCG/ACT AERO inhaler Inhale 2  puffs into the lungs in the morning and at bedtime.   Cholecalciferol (VITAMIN D3) 5000 UNITS CAPS Take 5,000 Units by mouth daily.    denosumab  (PROLIA ) 60 MG/ML SOLN injection Inject 60 mg into the skin every 6 (six) months. Administer in upper arm, thigh, or abdomen   EPINEPHrine  0.3 mg/0.3 mL IJ SOAJ injection Inject 0.3 mg into the muscle as needed for anaphylaxis.   Evolocumab  (REPATHA   SURECLICK) 140 MG/ML SOAJ Inject 140 mg into the skin every 14 (fourteen) days.   fexofenadine (ALLEGRA) 180 MG tablet Take 180 mg by mouth daily as needed for allergies or rhinitis.   fluticasone  (FLONASE ) 50 MCG/ACT nasal spray Place 1 spray into both nostrils at bedtime.   hydrochlorothiazide  (HYDRODIURIL ) 12.5 MG tablet Take 1 tablet (12.5 mg total) by mouth daily.   magnesium  oxide (MAG-OX) 400 (240 Mg) MG tablet TAKE 1 TABLET BY MOUTH EVERY DAY   metoprolol  succinate (TOPROL -XL) 25 MG 24 hr tablet TAKE 1 TABLET BY MOUTH EVERY DAY   Multiple Vitamin (MULTIVITAMIN WITH MINERALS) TABS tablet Take 1 tablet by mouth daily with breakfast.   OXYGEN  Inhale 2 L/min into the lungs at bedtime.   rosuvastatin  (CRESTOR ) 40 MG tablet TAKE 1 TABLET BY MOUTH EVERY DAY   furosemide  (LASIX ) 20 MG tablet DAILY AS NEEDED FOR SWELLING (Patient not taking: Reported on 09/18/2024)   Current Facility-Administered Medications on File Prior to Visit  Medication   [START ON 11/17/2024] denosumab  (PROLIA ) injection 60 mg     No Known Allergies  Review Of Systems:  Constitutional:   No  weight loss, night sweats,  Fevers, chills, fatigue, or  lassitude.  HEENT:   No headaches,  Difficulty swallowing,  Tooth/dental problems, or  Sore throat,                No sneezing, itching, ear ache, nasal congestion, post nasal drip,   CV:  No chest pain,  Orthopnea, PND, swelling in lower extremities, anasarca, dizziness, palpitations, syncope.   GI  No heartburn, indigestion, abdominal pain, nausea, vomiting, diarrhea, change in  bowel habits, loss of appetite, bloody stools.   Resp: No shortness of breath with exertion or at rest.  No excess mucus, no productive cough,  No non-productive cough,  No coughing up of blood.  No change in color of mucus.  No wheezing.  No chest wall deformity  Skin: no rash or lesions.  GU: no dysuria, change in color of urine, no urgency or frequency.  No flank pain, no hematuria   MS:  No joint pain or swelling.  No decreased range of motion.  No back pain.  Psych:  No change in mood or affect. No depression or anxiety.  No memory loss.   Vital Signs BP (!) 141/75   Pulse 66   Temp 97.6 F (36.4 C) (Oral)   Ht 5' 6 (1.676 m)   Wt 168 lb 9.6 oz (76.5 kg)   SpO2 94%   BMI 27.21 kg/m    Physical Exam: Physical Exam GENERAL: No distress, alert and oriented times 3. EARS NOSE THROAT: No sinus tenderness, tympanic membranes clear, pale nasal mucosa, no oral exudate, no post nasal drip, no lymphadenopathy. CHEST: Lungs clear to auscultation bilaterally. No wheeze, rales, dullness, no accessory muscle use, no nasal flaring, no sternal retractions. CARDIAC: S1, S2, regular rate and rhythm, no murmur. ABDOMINAL: Soft, non tender. ND, BS present. EXTREMITIES: No clubbing, cyanosis, edema. No obvious deformities. NEUROLOGICAL: Normal strength. Alert and oriented x 3, MAE x 4. SKIN: No rashes, warm and dry. No obvious skin lesions. PSYCHIATRIC: Normal mood and behavior.   Assessment/Plan  Assessment and Plan Assessment & Plan Stable pulmonary nodules concerning for malignancy Pulmonary nodules stable, no significant size change. Morphologically concerning but low SUV on 2022 PET scan  - Ordered PET scan to assess metabolic activity/ SUV. - Schedule follow-up to discuss PET results and management.  I spent 20  minutes dedicated to the care of this patient on the date of this encounter to include pre-visit review of records, face-to-face time with the patient discussing  conditions above, post visit ordering of testing, clinical documentation with the electronic health record, making appropriate referrals as documented, and communicating necessary information to the patient's healthcare team.     Lauraine JULIANNA Lites, NP 09/18/2024  11:27 AM

## 2024-09-23 ENCOUNTER — Telehealth: Payer: Self-pay | Admitting: *Deleted

## 2024-09-23 ENCOUNTER — Ambulatory Visit

## 2024-09-23 NOTE — Telephone Encounter (Signed)
 Pt was scheduled for AWV today at 9:40.  I contacted pt at 9:55 and she was in route to the office. Advised pt visit will need to be r/s. Pt declines and states she didn't really want to come in person for the visit anyway and didn't understand why visit was needed when she answered the questionnaire online. I explained AWV purpose and why in person was required at this time and pt declines to reschedule.

## 2024-09-24 ENCOUNTER — Other Ambulatory Visit (HOSPITAL_BASED_OUTPATIENT_CLINIC_OR_DEPARTMENT_OTHER): Payer: Self-pay | Admitting: Family

## 2024-09-29 DIAGNOSIS — L814 Other melanin hyperpigmentation: Secondary | ICD-10-CM | POA: Diagnosis not present

## 2024-09-29 DIAGNOSIS — D692 Other nonthrombocytopenic purpura: Secondary | ICD-10-CM | POA: Diagnosis not present

## 2024-09-29 DIAGNOSIS — L578 Other skin changes due to chronic exposure to nonionizing radiation: Secondary | ICD-10-CM | POA: Diagnosis not present

## 2024-09-29 DIAGNOSIS — L821 Other seborrheic keratosis: Secondary | ICD-10-CM | POA: Diagnosis not present

## 2024-09-29 DIAGNOSIS — D225 Melanocytic nevi of trunk: Secondary | ICD-10-CM | POA: Diagnosis not present

## 2024-09-30 ENCOUNTER — Encounter (HOSPITAL_COMMUNITY)
Admission: RE | Admit: 2024-09-30 | Discharge: 2024-09-30 | Disposition: A | Source: Ambulatory Visit | Attending: Acute Care | Admitting: Acute Care

## 2024-09-30 DIAGNOSIS — R918 Other nonspecific abnormal finding of lung field: Secondary | ICD-10-CM | POA: Diagnosis not present

## 2024-09-30 LAB — GLUCOSE, CAPILLARY: Glucose-Capillary: 98 mg/dL (ref 70–99)

## 2024-09-30 MED ORDER — FLUDEOXYGLUCOSE F - 18 (FDG) INJECTION
8.3900 | Freq: Once | INTRAVENOUS | Status: AC
  Administered 2024-09-30: 8.39 via INTRAVENOUS

## 2024-10-05 ENCOUNTER — Other Ambulatory Visit (HOSPITAL_BASED_OUTPATIENT_CLINIC_OR_DEPARTMENT_OTHER): Payer: Medicare Other

## 2024-10-14 ENCOUNTER — Encounter: Payer: Self-pay | Admitting: Acute Care

## 2024-10-14 ENCOUNTER — Ambulatory Visit: Admitting: Acute Care

## 2024-10-14 VITALS — BP 124/70 | HR 68 | Ht 66.0 in | Wt 168.8 lb

## 2024-10-14 DIAGNOSIS — R0609 Other forms of dyspnea: Secondary | ICD-10-CM

## 2024-10-14 DIAGNOSIS — R911 Solitary pulmonary nodule: Secondary | ICD-10-CM

## 2024-10-14 DIAGNOSIS — J449 Chronic obstructive pulmonary disease, unspecified: Secondary | ICD-10-CM

## 2024-10-14 DIAGNOSIS — R9389 Abnormal findings on diagnostic imaging of other specified body structures: Secondary | ICD-10-CM

## 2024-10-14 DIAGNOSIS — I350 Nonrheumatic aortic (valve) stenosis: Secondary | ICD-10-CM

## 2024-10-14 DIAGNOSIS — R942 Abnormal results of pulmonary function studies: Secondary | ICD-10-CM

## 2024-10-14 DIAGNOSIS — R918 Other nonspecific abnormal finding of lung field: Secondary | ICD-10-CM

## 2024-10-14 DIAGNOSIS — Z953 Presence of xenogenic heart valve: Secondary | ICD-10-CM

## 2024-10-14 DIAGNOSIS — Z87891 Personal history of nicotine dependence: Secondary | ICD-10-CM | POA: Diagnosis not present

## 2024-10-14 DIAGNOSIS — Z950 Presence of cardiac pacemaker: Secondary | ICD-10-CM

## 2024-10-14 NOTE — Patient Instructions (Addendum)
 It is good to see you today. We have reviewed your PET scan results. This shows that the nodules we have been watching are slightly more hypermetabolic than in your previous scan, but still slow growing. I have referred you to radiation oncology for consideration of radiation treatment without tissue sampling. You will get a call to get this appointment scheduled.  If they prefer not to proceed with treatment, we can determine if you want to undergo a biopsy, or just continue to watch the nodules.  Follow up with Lauraine NP in 6 weeks to discuss radiation oncology visit  results. Call if you need us  sooner . Please contact office for sooner follow up if symptoms do not improve or worsen or seek emergency care

## 2024-10-14 NOTE — Progress Notes (Signed)
 History of Present Illness Dominique [name] is a 83 y.o. female  former smoker , quit 2005 ,with a 72.6 pack year smoking history. She is followed by Dr. Shelah for an abnormal pulmonary nodule, which we are following with surveillance imaging.      10/14/2024 Discussed the use of AI scribe software for clinical note transcription with the patient, who gave verbal consent to proceed.  History of Present Illness Dominique [name] is an 83 year old female with stable pulmonary nodules under surveillance who presents for follow-up.   Patient  has a history of pulmonary nodules that have remained stable over time. She is a former smoker . She smoked for 43 years. She has a 72.6 pack year smoking history. The nodules under surveillance include one in the posterior left upper lobe measuring 2.9 by 1.2 cm and another measuring 1.2 by 1.1 cm, both had increased in size on most recent imaging 09/03/2024.     Per the radiology read, and my personal review, the nodules of concern do have a morphologically suspicious for indolent multifocal adenocarcinoma. Because of this plan was to repeat PET scan to ensure SUV readings remain low. If SUV had increased we will consider moving forward with biopsy vs prophylactic SBRT. She is in agreement with this plan.   She is here following up today after PET scan.  All nodules have had a slight increase in their SUV ratings.The low-grade activity remains nonspecific and emphasis on morphologic appearance and morphologic change over time is recommended in differentiating low-grade adenocarcinoma from benign causes.  Patient is here today with her daughter.  Patient has significant risk factors when considering bronchoscopy with biopsies.  She has had a TAVR March 2020, she has chronic hypoxic respiratory failure requiring nocturnal oxygen  and oxygen  with exertion, COPD, thoracic ascending aortic aneurysm diagnosed 2021, severe aortic stenosis, essential hypertension,  pulmonary hypertension, and she is 83 years old.During hospitalization 03/2019 for laparoscopic cholecystectomy complicated by atrial fibrillation felt to be due to acute illness and not started on long-term OAC. Seen 05/2019 by PCP with concerning symptoms for TIA. Bilateral carotid Dopplers 60 to 79% right ICA stenosis and 1 to 39% left stenosis. Repeat carotid Dopplers 02/2020 mild bilateral stenosis bilaterally.   Both patient and daughter are very concerned about the general anesthesia and the impact and invasive procedure may have on patient's quality of life.  Despite all her problems at present she is doing very well.  We discussed having her see radiation oncology for consideration of SBRT to these lung nodules without tissue diagnosis.  While radiation oncology may not agree to this, both patient and her daughter would like to have the option of learning more about the possibility.  I explained that I will make the referral and they can talk to radiation oncology about whether or not this was an option.  They realize that this is not an option she will most likely need to undergo bronchoscopy with biopsies.   Patient has had no weight loss, hemoptysis, or cough. She feels well.  She looks incredible for 83 years old.     Test Results: PET scan 09/30/2024 Multiple lung nodules with low-level fluorodeoxyglucose uptake, including a 1.2 cm right upper lobe ground glass nodule (SUV 1.0, previously 0.9), a 2.5 cm left upper lobe lesion along the major fissure (SUV 2.1, previously 1.5), and a 1.6 cm right upper lobe ground glass nodule (SUV 1.5, previously 1.1). The low-grade activity remains nonspecific and emphasis on  morphologic appearance and morphologic change over time is recommended in differentiating low-grade adenocarcinoma from benign causes. 2. Prominence of the right and left pulmonary arteries, suspicious for pulmonary arterial hypertension. 3. Systemic atherosclerosis, including the  aorta, iliac arteries, and the left anterior descending coronary artery.  CT chest 09/03/2024  Unchanged pulmonary nodules and opacities, including in the posterior left upper lobe a subsolid nodule abutting and tenting the major fissure measuring 2.9 x 1.2 cm, a ground-glass nodule of the peripheral right upper lobe measuring 1.2 x 1.1 cm, and a ground-glass nodule of the posterior right upper lobe, abutting and tenting the major fissure measuring 1.8 x 1.7 cm. These are unchanged in comparison to multiple prior examinations dating back to 02/12/2023 and remain morphologically suspicious for indolent multifocal adenocarcinoma. 2. Previously seen new nodule in the posterior right upper lobe is now resolved, consistent with resolved infection or inflammation. 3. Mild underlying centrilobular emphysema. 4. Enlarged left and right pulmonary arteries, as can be seen in pulmonary hypertension. 5. Coronary artery disease.   CTA 05/03/2024 Mediastinum/Nodes: No mediastinal or axillary lymphadenopathy. Prominent lymph nodes are present at the right hilum measuring up to 1.1 cm. The thyroid  gland, trachea, and esophagus are within normal limits. There is a small hiatal hernia.   Lungs/Pleura: Paraseptal and centrilobular emphysematous changes are present in the lungs. There is a trace right pleural effusion. Mild airspace disease is noted in the right lower lobe. No pneumothorax is seen. Stable consolidation is noted in the posterior segment of the left upper lobe. There is a nodular opacity in the left upper lobe measuring 5 mm, axial image 39. A stable 1.3 cm ground-glass nodule is noted in the right upper lobe, axial image 32. A stable 1.5 cm ground-glass nodule is noted in the right upper lobe, axial image 46 there is a 5 mm nodule in the right upper lobe, axial image 66, new from the previous exam. Ground-glass nodule is noted in the right upper lobe, axial image 46. There is a stable 4 mm  nodule in the right middle lobe, axial image 90. No evidence of pulmonary embolism. 2. Mild airspace disease in the right lower lobe, possible atelectasis or infiltrate. 3. Trace right pleural effusion. 4. Scattered ground-glass opacities and pulmonary nodules bilaterally, concerning for indolent adenocarcinoma. 5. Emphysema. 6. Coronary artery calcifications. 7. Aortic atherosclerosis.   05/03/2024 CXR Heart size and mediastinal contours are normal. Status post TAVR. Prominent bilateral pulmonary arteries. No pleural fluid: Interstitial edema, or airspace consolidation. Visualized osseous structures are unremarkable.   IMPRESSION: 1. No acute cardiopulmonary abnormalities. 2. Prominent bilateral pulmonary arteries. Correlate for any clinical signs or symptoms of pulmonary arterial hypertension.   02/25/2024 CT Chest IMPRESSION: 1. 2.9 x 1.1 cm subsolid irregular spiculated nodule in the posterior left upper lobe, not notably changed from last year's studies but has slightly increased in size since 2020 consistent with a slow growing adenocarcinoma. 2. Stable 6 mm ground-glass right middle lobe nodule, new from 2020. 3. Stable ground-glass nodules in the right upper lobe. Adenocarcinoma not excluded. 4. Stable 3 mm solid right middle lobe nodule. 5. Aortic and coronary artery atherosclerosis. 6. Stable dilatation of the left and right main pulmonary arteries. 7. Small hiatal hernia. 8. Osteopenia and degenerative change.   04/2021 PET scan  Non solid nodule within the posterior left upper lobe ablating the oblique fissure measures 2.5 by 1.3 cm with SUV max of 1.52, image 20/8. This is stable in size from 01/27/2020.   Non solid  nodule within the periphery of the right upper lobe measures 0.9 cm a with SUV max of 0.87, image 15/8. Also stable from 01/27/2020.   1.6 cm non solid nodule in the posterolateral right upper has an SUV max of 1.07, image 23/8. Stable from  01/27/2020.   The subpleural nodule within the medial left upper lobe measures 4 mm with SUV max of 1.32, image 12/8. Stable from 01/27/2020. This is technically too small to reliably characterize by PET-CT. No new lung nodules.   Incidental CT findings: Aortic atherosclerosis. Status post TAVR. Coronary artery calcifications.   ABDOMEN/PELVIS: No abnormal hypermetabolic activity within the liver, pancreas, adrenal glands, or spleen. No hypermetabolic lymph nodes in the abdomen or pelvis.   Incidental CT findings: Cholecystectomy. Aortic atherosclerosis. Sigmoid diverticulosis.   SKELETON: No focal hypermetabolic activity to suggest skeletal metastasis.   Incidental CT findings: Marked arthropathic changes with synovial hypertrophy and increased uptake localizes to the right sternoclavicular joint. Status post bilateral sacroplasty. Status post bilateral hip arthroplasty. Remote healed left superior and inferior pubic rami fractures. Lumbar degenerative disc disease.   IMPRESSION: 1. Unchanged appearance of bilateral upper lobe non solid nodules when compared with the previous PET-CT from 01/27/2020. Corresponding SUV max is low level and nonspecific. Consider additional follow-up in 1 year toensure ongoing stability through at least 5 years.     Latest Ref Rng & Units 05/21/2024   10:43 AM 05/04/2024    5:26 AM 05/03/2024   12:21 PM  CBC  WBC 4.0 - 10.5 K/uL 7.9  10.8    Hemoglobin 12.0 - 15.0 g/dL 87.0  87.2  86.3   Hematocrit 36.0 - 46.0 % 39.3  38.5  40.0   Platelets 150.0 - 400.0 K/uL 243.0  254         Latest Ref Rng & Units 08/03/2024   10:43 AM 05/21/2024   10:43 AM 05/04/2024    5:26 AM  BMP  Glucose 70 - 99 mg/dL 89  90  899   BUN 6 - 23 mg/dL 16  9  18    Creatinine 0.40 - 1.20 mg/dL 9.33  9.37  9.29   Sodium 135 - 145 mEq/L 142  141  138   Potassium 3.5 - 5.1 mEq/L 4.0  4.2  3.7   Chloride 96 - 112 mEq/L 101  106  104   CO2 19 - 32 mEq/L 29  30  25    Calcium   8.4 - 10.5 mg/dL 9.1  8.8  8.3     BNP    Component Value Date/Time   BNP 67.0 09/02/2020 1237   BNP 65.3 12/26/2018 1015    ProBNP No results found for: PROBNP  PFT    Component Value Date/Time   FEV1PRE 1.06 12/09/2018 1404   FEV1POST 1.07 12/09/2018 1404   FVCPRE 1.77 12/09/2018 1404   FVCPOST 2.17 12/09/2018 1404   TLC 5.43 12/09/2018 1404   DLCOUNC 8.82 12/09/2018 1404   PREFEV1FVCRT 60 12/09/2018 1404   PSTFEV1FVCRT 49 12/09/2018 1404    NM PET Image Restage (PS) Skull Base to Thigh (F-18 FDG) Result Date: 10/04/2024 EXAM: PET AND CT SKULL BASE TO MID THIGH 09/30/2024 05:16:41 PM TECHNIQUE: RADIOPHARMACEUTICAL: 8.39 mCi F-18 FDG Uptake time 60 minutes. Glucose level 98 mg/dl. Blood pool SUV 2.6. Distal esophageal activity observed with maximum SUV 4.1, previously 4.6, likely physiologic. The approximately 1.2 cm right upper lobe ground glass density pulmonary nodule on image 18 series 7 has a maximum SUV of 1.0, previously 0.9. The  confluent 2.5 cm in long axis left upper lobe lesion along the major fissure on image 21 series 7 has a maximum SUV of 2.1, formally 1.5. The indistinctly marginated 1.6 cm ground glass density posteriorly in the right upper lobe on image 24 series 7 has a maximum SUV of 1.5, previously 1.1. PET imaging was acquired from the base of the skull to the mid thighs. Non-contrast enhanced computed tomography was obtained for attenuation correction and anatomic localization. COMPARISON: Chest CT 09/03/2024 and PET CT from 05/26/2021. CLINICAL HISTORY: Lung nodules, multiple. FINDINGS: HEAD AND NECK: No metabolically active cervical lymphadenopathy. Bilateral common carotid atheromatous vascular calcification. CHEST: The approximately 1.2 cm right upper lobe ground glass density pulmonary nodule on image 18 series 7 has a maximum SUV of 1.0, previously 0.9. The confluent 2.5 cm in long axis left upper lobe lesion along the major fissure on image 21 series 7 has  a maximum SUV of 2.1, formally 1.5. The indistinctly marginated 1.6 cm ground glass density posteriorly in the right upper lobe on image 24 series 7 has a maximum SUV of 1.5, previously 1.1. No metabolically active lymphadenopathy. Aortic and branch vessel atherosclerotic vascular calcification. Prominent right and left pulmonary arteries raising concern for pulmonary arterial hypertension. Left anterior descending coronary atherosclerosis. ABDOMEN AND PELVIS: No metabolically active intraperitoneal mass. No metabolically active lymphadenopathy. Physiologic activity within the gastrointestinal and genitourinary systems. Photopenic benign fluid density right kidney upper pole cyst warrant no further imaging workup. Cholecystectomy. Systemic atherosclerosis is present, including the aorta and iliac arteries. Sigmoid colon diverticulosis with scattered diverticula elsewhere in the colon. BONES AND SOFT TISSUE: No abnormal FDG activity localizes to the bones. No metabolically active aggressive osseous lesion. Prior lower lumbar laminectomies. Prior sacroplasty. Bilateral total hip prostheses. Old left pubic ramus fractures appear healed. IMPRESSION: 1. Multiple lung nodules with low-level fluorodeoxyglucose uptake, including a 1.2 cm right upper lobe ground glass nodule (SUV 1.0, previously 0.9), a 2.5 cm left upper lobe lesion along the major fissure (SUV 2.1, previously 1.5), and a 1.6 cm right upper lobe ground glass nodule (SUV 1.5, previously 1.1). The low-grade activity remains nonspecific and emphasis on morphologic appearance and morphologic change over time is recommended in differentiating low-grade adenocarcinoma from benign causes. 2. Prominence of the right and left pulmonary arteries, suspicious for pulmonary arterial hypertension. 3. Systemic atherosclerosis, including the aorta, iliac arteries, and the left anterior descending coronary artery. Electronically signed by: Ryan Salvage MD 10/04/2024  04:59 PM EST RP Workstation: [id number]     Past medical hx Past Medical History:  Diagnosis Date   Allergy     Atrial fibrillation (HCC)    a. dx 03/2019 while in hospital with cholecystitis   COPD (chronic obstructive pulmonary disease) (HCC)    Critical lower limb ischemia (HCC)    a. after TAVR developed ischemic L leg likely due to flap/closure, s/p emergent repair of left femoral artery with endarterectomy and Dacron patch angioplasty.   Emphysema lung (HCC)    Emphysema of lung (HCC)    GERD (gastroesophageal reflux disease)    Heart murmur    Hyperlipidemia    LDL goal < 70   Hypertension    Hypomagnesemia    Normal coronary arteries 10/2018   NSVT (nonsustained ventricular tachycardia) (HCC) 03/02/2019   Osteoporosis    Oxygen  deficiency    Pulmonary HTN (HCC) 06/05/2016   Moderate with PASP by echo 04/2016 likely Group 3 from COPD and possibly Group 2 from pulmonary venous HTN associated with  moderate AS - f/u echo 12/2018 post TAVR showed normal RVSP   Pulmonary nodules    seen on pre TAVR CT, needs 12 month CT follow up   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 s/p TAVR 12/2018.   Thoracic ascending aortic aneurysm    needs yearly follow up   Thyroid  nodule 11/08/2022     Social History[1]  Ms.Mcpartland reports that she quit smoking about 20 years ago. Her smoking use included cigarettes. She started smoking about 69 years ago. She has a 72.6 pack-year smoking history. She has never used smokeless tobacco. She reports current alcohol use of about 3.0 - 4.0 standard drinks of alcohol per week. She reports that she does not use drugs.  Tobacco Cessation: Counseling given: Not Answered Tobacco comments: Started smoking at age 81 Former smoker quit 2005 with a 72.6-pack-year smoking history  Past  surgical hx, Family hx, Social hx all reviewed.  Current Outpatient Medications on File Prior to Visit  Medication Sig   albuterol  (VENTOLIN  HFA) 108 (90 Base) MCG/ACT inhaler Inhale 2 puffs into the lungs every 6 (six) hours as needed for wheezing or shortness of breath.   amLODipine  (NORVASC ) 5 MG tablet Take 1 tablet (5 mg total) by mouth daily.   amoxicillin  (AMOXIL ) 500 MG tablet TAKE 4 TABLETS (2,000MG ) ONE HOUR PRIOR TO ALL DENTAL VISITS.   aspirin  EC 81 MG tablet Take 81 mg by mouth daily.   budesonide -glycopyrrolate-formoterol  (BREZTRI  AEROSPHERE) 160-9-4.8 MCG/ACT AERO inhaler Inhale 2 puffs into the lungs in the morning and at bedtime.   Cholecalciferol (VITAMIN D3) 5000 UNITS CAPS Take 5,000 Units by mouth daily.    denosumab  (PROLIA ) 60 MG/ML SOLN injection Inject 60 mg into the skin every 6 (six) months. Administer in upper arm, thigh, or abdomen   EPINEPHrine  0.3 mg/0.3 mL IJ SOAJ injection Inject 0.3 mg into the muscle as needed for anaphylaxis.   Evolocumab  (REPATHA  SURECLICK) 140 MG/ML SOAJ Inject 140 mg into the skin every 14 (fourteen) days.   fexofenadine (ALLEGRA) 180 MG tablet Take 180 mg by mouth daily as needed for allergies or rhinitis.   fluticasone  (FLONASE ) 50 MCG/ACT nasal spray Place 1 spray into both nostrils at bedtime.   hydrochlorothiazide  (HYDRODIURIL ) 12.5 MG tablet Take 1 tablet (12.5 mg total) by mouth daily.   magnesium  oxide (MAG-OX) 400 (240 Mg) MG tablet TAKE 1 TABLET BY MOUTH EVERY DAY   metoprolol  succinate (TOPROL -XL) 25 MG 24 hr tablet TAKE 1 TABLET BY MOUTH EVERY DAY   Multiple Vitamin (MULTIVITAMIN WITH MINERALS) TABS tablet Take 1 tablet by mouth daily with breakfast.   OXYGEN  Inhale 2 L/min into the lungs at bedtime.   rosuvastatin  (CRESTOR ) 40 MG tablet TAKE 1 TABLET BY MOUTH EVERY DAY   furosemide  (LASIX ) 20 MG tablet DAILY AS NEEDED FOR SWELLING (Patient not taking: Reported on 10/14/2024)   Current Facility-Administered Medications on File  Prior to Visit  Medication   [START ON 11/17/2024] denosumab  (PROLIA ) injection 60 mg     Allergies[2]  Review Of Systems:  Constitutional:   No  weight loss, night sweats,  Fevers, chills, +fatigue, or  lassitude.  HEENT:   No headaches,  Difficulty swallowing,  Tooth/dental problems, or  Sore throat,  No sneezing, itching, ear ache, nasal congestion, post nasal drip,   CV:  No chest pain,  Orthopnea, PND, swelling in lower extremities, anasarca, dizziness, palpitations, syncope.   GI  No heartburn, indigestion, abdominal pain, nausea, vomiting, diarrhea, change in bowel habits, loss of appetite, bloody stools.   Resp: + shortness of breath with exertion less at rest.  No excess mucus, no productive cough,  No non-productive cough,  No coughing up of blood.  No change in color of mucus.  No wheezing.  No chest wall deformity  Skin: no rash or lesions.  GU: no dysuria, change in color of urine, no urgency or frequency.  No flank pain, no hematuria   MS:  No joint pain or swelling.  No decreased range of motion.  No back pain.  Psych:  No change in mood or affect. No depression or anxiety.  No memory loss.   Vital Signs BP 124/70   Pulse 68   Ht 5' 6 (1.676 m)   Wt 168 lb 12.8 oz (76.6 kg)   SpO2 97%   BMI 27.25 kg/m    Physical Exam:  General- No distress,  A&Ox3, pleasant and appropriate ENT: No sinus tenderness, TM clear, pale nasal mucosa, no oral exudate,no post nasal drip, no LAN Cardiac: S1, S2, regular rate and rhythm, no murmur,  Chest: No wheeze/ rales/ dullness; no accessory muscle use, no nasal flaring, no sternal retractions Abd.: Soft Non-tender, nondistended, bowel sounds positive,Body mass index is 27.25 kg/m.  Ext: No clubbing cyanosis, edema, no obvious deformities Neuro: Physical deconditioning appropriate with her age, moving all extremities x 4, alert and oriented x 3 Skin: No rashes, warm and dry, no obvious skin lesions Psych:  normal mood and behavior  Physical Exam    Assessment/Plan Growing pulmonary nodules concerning for malignancy Slight increase in PET avidity of all nodules  No weight loss or hemoptysis Former smoker High risk for general anesthesia and bronchoscopy with biopsies due to multiple comorbidities Plan We have reviewed your PET scan results. This shows that the nodules we have been watching are slightly more hypermetabolic than in your previous scan, but still slow growing. I have referred you to radiation oncology for consideration of radiation treatment without tissue sampling. You will get a call to get this appointment scheduled.  If they prefer not to proceed with treatment, we can determine if you want to undergo a biopsy, or just continue to watch the nodules.  Follow up with Lauraine NP in 6 weeks to discuss radiation oncology visit results. Call if you need us  sooner . Please contact office for sooner follow up if symptoms do not improve or worsen or seek emergency care    I spent 30 minutes dedicated to the care of this patient on the date of this encounter to include pre-visit review of records, face-to-face time with the patient discussing conditions above, post visit ordering of testing, clinical documentation with the electronic health record, making appropriate referrals as documented, and communicating necessary information to the patient's healthcare team.  Assessment & Plan        Lauraine JULIANNA Lites, NP 10/14/2024  9:51 AM             [1]  Social History Tobacco Use   Smoking status: Former    Current packs/day: 0.00    Average packs/day: 1.5 packs/day for 48.4 years (72.6 ttl pk-yrs)    Types: Cigarettes    Start date: 41    Quit date: 03/19/2004  Years since quitting: 20.5   Smokeless tobacco: Never   Tobacco comments:    Started smoking at age 87  Vaping Use   Vaping status: Never Used  Substance Use Topics   Alcohol use: Yes    Alcohol/week: 3.0  - 4.0 standard drinks of alcohol    Types: 3 - 4 Glasses of wine per week    Comment: socially   Drug use: No  [2] No Known Allergies

## 2024-10-28 NOTE — Progress Notes (Incomplete)
 " Radiation Oncology         [phone] ________________________________  Name: Dominique [name]        MRN: [id number]  Date of Service: 10/30/2024 DOB: 1941-10-12  CC:Paz, Aloysius BRAVO, MD  Shelah Lamar RAMAN, MD     REFERRING PHYSICIAN: Shelah Lamar RAMAN, MD   DIAGNOSIS: There were no encounter diagnoses. @icd10 @  Oncology History   No problem history exists.     HISTORY OF PRESENT ILLNESS: Dominique [name] is a 83 y.o. female seen in consultation for radiation therapy.    PREVIOUS RADIATION THERAPY: {EXAM; YES/NO:19492::No}  AUTOIMMUNE DISEASE: {EXAM; YES/NO:19492::No}  MEDICAL DEVICES: {EXAM; YES/NO:19492::No}  PREGNANCY: {EXAM; YES/NO:19492::No}   PAST MEDICAL HISTORY:  Past Medical History:  Diagnosis Date   Allergy     Atrial fibrillation (HCC)    a. dx 03/2019 while in hospital with cholecystitis   COPD (chronic obstructive pulmonary disease) (HCC)    Critical lower limb ischemia (HCC)    a. after TAVR developed ischemic L leg likely due to flap/closure, s/p emergent repair of left femoral artery with endarterectomy and Dacron patch angioplasty.   Emphysema lung (HCC)    Emphysema of lung (HCC)    GERD (gastroesophageal reflux disease)    Heart murmur    Hyperlipidemia    LDL goal < 70   Hypertension    Hypomagnesemia    Normal coronary arteries 10/2018   NSVT (nonsustained ventricular tachycardia) (HCC) 03/02/2019   Osteoporosis    Oxygen  deficiency    Pulmonary HTN (HCC) 06/05/2016   Moderate with PASP by echo 04/2016 likely Group 3 from COPD and possibly Group 2 from pulmonary venous HTN associated with moderate AS - f/u echo 12/2018 post TAVR showed normal RVSP   Pulmonary nodules    seen on pre TAVR CT, needs 12 month CT follow up   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 s/p TAVR 12/2018.    Thoracic ascending aortic aneurysm    needs yearly follow up   Thyroid  nodule 11/08/2022       PAST SURGICAL HISTORY: Past Surgical History:  Procedure Laterality Date   APPENDECTOMY  1951   CARDIAC VALVE REPLACEMENT  01/26/19   CATARACT EXTRACTION     CESAREAN SECTION     1971   CHOLECYSTECTOMY N/A 04/28/2019   Procedure: LAPAROSCOPIC CHOLECYSTECTOMY;  Surgeon: Aron Shoulders, MD;  Location: [facility];  Service: General;  Laterality: N/A;   COLONOSCOPY  2018   EYE SURGERY     FEMORAL-POPLITEAL BYPASS GRAFT Left 12/31/2018   Procedure: PATCH ANGIOPLASTY REPAIR LEFT FEMORAL ARTERY USING HEMASHIELD PLATINUM FINESSE PATCH;  Surgeon: Oris Krystal FALCON, MD;  Location: [facility] OR;  Service: Vascular;  Laterality: Left;   IR RADIOLOGY PERIPHERAL GUIDED IV START  05/30/2017   IR RADIOLOGY PERIPHERAL GUIDED IV START  09/02/2018   IR RADIOLOGY PERIPHERAL GUIDED IV START  09/02/2018   IR US  GUIDE VASC ACCESS LEFT  09/02/2018   IR US  GUIDE VASC ACCESS RIGHT  05/30/2017   IR US  GUIDE VASC ACCESS RIGHT  09/02/2018   JOINT REPLACEMENT  2016   sacroplasty   LAMINECTOMY     RIGHT/LEFT HEART CATH AND CORONARY ANGIOGRAPHY N/A 11/27/2018   Procedure: RIGHT/LEFT HEART CATH AND CORONARY ANGIOGRAPHY;  Surgeon: Wonda Sharper, MD;  Location: [facility] INVASIVE CV LAB;  Service: Cardiovascular;  Laterality: N/A;   TEE WITHOUT CARDIOVERSION N/A 12/30/2018   Procedure: TRANSESOPHAGEAL ECHOCARDIOGRAM (TEE);  Surgeon: Wonda Sharper, MD;  Location: [facility] INVASIVE CV LAB;  Service: Open Heart Surgery;  Laterality: N/A;   TOTAL HIP ARTHROPLASTY Bilateral    TRANSCATHETER AORTIC VALVE REPLACEMENT, TRANSFEMORAL N/A 12/30/2018   Procedure: TRANSCATHETER AORTIC VALVE REPLACEMENT, TRANSFEMORAL;  Surgeon: Wonda Sharper, MD;  Location: [facility] INVASIVE CV LAB;  Service: Open Heart Surgery;  Laterality: N/A;   TUBAL LIGATION  1975     FAMILY HISTORY:  Family History  Problem Relation Age of Onset   Breast cancer Mother        breast    Cancer Brother        lung   Diabetes Son        type 1   Colon cancer Neg Hx    CAD Neg Hx    Colon polyps Neg Hx    Rectal cancer Neg Hx    Stomach cancer Neg Hx    Transient ischemic attack Neg Hx    Stroke Neg Hx      SOCIAL HISTORY:  reports that she quit smoking about 20 years ago. Her smoking use included cigarettes. She started smoking about 69 years ago. She has a 72.6 pack-year smoking history. She has never used smokeless tobacco. She reports current alcohol use of about 3.0 - 4.0 standard drinks of alcohol per week. She reports that she does not use drugs.   ALLERGIES: Patient has no known allergies.   MEDICATIONS:  Current Outpatient Medications  Medication Sig Dispense Refill   albuterol  (VENTOLIN  HFA) 108 (90 Base) MCG/ACT inhaler Inhale 2 puffs into the lungs every 6 (six) hours as needed for wheezing or shortness of breath. 18 g 3   amLODipine  (NORVASC ) 5 MG tablet Take 1 tablet (5 mg total) by mouth daily. 90 tablet 1   amoxicillin  (AMOXIL ) 500 MG tablet TAKE 4 TABLETS (2,000MG ) ONE HOUR PRIOR TO ALL DENTAL VISITS. 12 tablet 3   aspirin  EC 81 MG tablet Take 81 mg by mouth daily.     budesonide -glycopyrrolate-formoterol  (BREZTRI  AEROSPHERE) 160-9-4.8 MCG/ACT AERO inhaler Inhale 2 puffs into the lungs in the morning and at bedtime. 3 each 3   Cholecalciferol (VITAMIN D3) 5000 UNITS CAPS Take 5,000 Units by mouth daily.      denosumab  (PROLIA ) 60 MG/ML SOLN injection Inject 60 mg into the skin every 6 (six) months. Administer in upper arm, thigh, or abdomen     EPINEPHrine  0.3 mg/0.3 mL IJ SOAJ injection Inject 0.3 mg into the muscle as needed for anaphylaxis.  12   Evolocumab  (REPATHA  SURECLICK) 140 MG/ML SOAJ Inject 140 mg into the skin every 14 (fourteen) days. 2 mL 6   fexofenadine (ALLEGRA) 180 MG tablet Take 180 mg by mouth daily as needed for allergies or rhinitis.     fluticasone  (FLONASE ) 50 MCG/ACT nasal spray Place 1 spray into both nostrils at bedtime.  48 g 1   furosemide  (LASIX ) 20 MG tablet DAILY AS NEEDED FOR SWELLING (Patient not taking: Reported on 10/14/2024) 30 tablet 3   hydrochlorothiazide  (HYDRODIURIL ) 12.5 MG tablet Take 1 tablet (12.5 mg total) by mouth daily. 90 tablet 1   magnesium  oxide (MAG-OX) 400 (240 Mg) MG tablet TAKE 1 TABLET BY MOUTH EVERY DAY 90 tablet 2   metoprolol  succinate (TOPROL -XL) 25 MG 24 hr tablet TAKE 1 TABLET BY MOUTH EVERY DAY 90 tablet 3   Multiple Vitamin (MULTIVITAMIN WITH MINERALS) TABS tablet Take  1 tablet by mouth daily with breakfast.     OXYGEN  Inhale 2 L/min into the lungs at bedtime.     rosuvastatin  (CRESTOR ) 40 MG tablet TAKE 1 TABLET BY MOUTH EVERY DAY 90 tablet 3   Current Facility-Administered Medications  Medication Dose Route Frequency Provider Last Rate Last Admin   [START ON 11/17/2024] denosumab  (PROLIA ) injection 60 mg  60 mg Subcutaneous Q6 months Paz, Jose E, MD         REVIEW OF SYSTEMS: The patient reports that they are doing well generally. Pertinent ROS per HPI and otherwise negative.    PHYSICAL EXAM:  Wt Readings from Last 3 Encounters:  10/14/24 168 lb 12.8 oz (76.6 kg)  09/18/24 168 lb 9.6 oz (76.5 kg)  07/20/24 165 lb 8 oz (75.1 kg)   Temp Readings from Last 3 Encounters:  09/18/24 97.6 F (36.4 C) (Oral)  07/20/24 98 F (36.7 C) (Oral)  05/08/24 (!) 97.5 F (36.4 C) (Oral)   BP Readings from Last 3 Encounters:  10/14/24 124/70  09/18/24 (!) 141/75  07/20/24 134/70   Pulse Readings from Last 3 Encounters:  10/14/24 68  09/18/24 66  07/20/24 68    /10   Physical Exam   ECOG = ***   LABORATORY DATA:  Lab Results  Component Value Date   WBC 7.9 05/21/2024   HGB 12.9 05/21/2024   HCT 39.3 05/21/2024   MCV 94.0 05/21/2024   PLT 243.0 05/21/2024   Lab Results  Component Value Date   NA 142 08/03/2024   K 4.0 08/03/2024   CL 101 08/03/2024   CO2 29 08/03/2024   Lab Results  Component Value Date   ALT 22 06/30/2024   AST 21 05/04/2024    ALKPHOS 40 05/04/2024   BILITOT 0.7 05/04/2024      RADIOGRAPHY: NM PET Image Restage (PS) Skull Base to Thigh (F-18 FDG) Result Date: 10/04/2024 EXAM: PET AND CT SKULL BASE TO MID THIGH 09/30/2024 05:16:41 PM TECHNIQUE: RADIOPHARMACEUTICAL: 8.39 mCi F-18 FDG Uptake time 60 minutes. Glucose level 98 mg/dl. Blood pool SUV 2.6. Distal esophageal activity observed with maximum SUV 4.1, previously 4.6, likely physiologic. The approximately 1.2 cm right upper lobe ground glass density pulmonary nodule on image 18 series 7 has a maximum SUV of 1.0, previously 0.9. The confluent 2.5 cm in long axis left upper lobe lesion along the major fissure on image 21 series 7 has a maximum SUV of 2.1, formally 1.5. The indistinctly marginated 1.6 cm ground glass density posteriorly in the right upper lobe on image 24 series 7 has a maximum SUV of 1.5, previously 1.1. PET imaging was acquired from the base of the skull to the mid thighs. Non-contrast enhanced computed tomography was obtained for attenuation correction and anatomic localization. COMPARISON: Chest CT 09/03/2024 and PET CT from 05/26/2021. CLINICAL HISTORY: Lung nodules, multiple. FINDINGS: HEAD AND NECK: No metabolically active cervical lymphadenopathy. Bilateral common carotid atheromatous vascular calcification. CHEST: The approximately 1.2 cm right upper lobe ground glass density pulmonary nodule on image 18 series 7 has a maximum SUV of 1.0, previously 0.9. The confluent 2.5 cm in long axis left upper lobe lesion along the major fissure on image 21 series 7 has a maximum SUV of 2.1, formally 1.5. The indistinctly marginated 1.6 cm ground glass density posteriorly in the right upper lobe on image 24 series 7 has a maximum SUV of 1.5, previously 1.1. No metabolically active lymphadenopathy. Aortic and branch vessel atherosclerotic vascular calcification. Prominent right and  left pulmonary arteries raising concern for pulmonary arterial hypertension. Left  anterior descending coronary atherosclerosis. ABDOMEN AND PELVIS: No metabolically active intraperitoneal mass. No metabolically active lymphadenopathy. Physiologic activity within the gastrointestinal and genitourinary systems. Photopenic benign fluid density right kidney upper pole cyst warrant no further imaging workup. Cholecystectomy. Systemic atherosclerosis is present, including the aorta and iliac arteries. Sigmoid colon diverticulosis with scattered diverticula elsewhere in the colon. BONES AND SOFT TISSUE: No abnormal FDG activity localizes to the bones. No metabolically active aggressive osseous lesion. Prior lower lumbar laminectomies. Prior sacroplasty. Bilateral total hip prostheses. Old left pubic ramus fractures appear healed. IMPRESSION: 1. Multiple lung nodules with low-level fluorodeoxyglucose uptake, including a 1.2 cm right upper lobe ground glass nodule (SUV 1.0, previously 0.9), a 2.5 cm left upper lobe lesion along the major fissure (SUV 2.1, previously 1.5), and a 1.6 cm right upper lobe ground glass nodule (SUV 1.5, previously 1.1). The low-grade activity remains nonspecific and emphasis on morphologic appearance and morphologic change over time is recommended in differentiating low-grade adenocarcinoma from benign causes. 2. Prominence of the right and left pulmonary arteries, suspicious for pulmonary arterial hypertension. 3. Systemic atherosclerosis, including the aorta, iliac arteries, and the left anterior descending coronary artery. Electronically signed by: Ryan Salvage MD 10/04/2024 04:59 PM EST RP Workstation: [id number]     PATHOLOGY:     IMPRESSION/PLAN:  The patient was seen to discuss management of early-stage non-small cell lung cancer (NSCLC). We reviewed that surgical resection is the standard of care for operable patients, offering excellent long-term local control. However, some patients are not ideal surgical candidates due to limited pulmonary function,  significant comorbidities, or personal preference. For these individuals, stereotactic body radiation therapy (SBRT) provides a highly effective, non-invasive alternative with comparable local control rates.  We discussed the logistics of SBRT in detail. The first step is a simulation CT scan, which includes 4D CT imaging to account for breathing motion. Immobilization devices are used for comfort and reproducibility. The simulation appointment lasts approximately 45 minutes. Treatment planning then takes about one week, during which the radiation oncology team contours the tumor and normal structures and develops a safe, precise treatment plan.  SBRT is typically delivered over 3-5 treatments. Each visit lasts about 30-45 minutes, though the actual beam delivery time is only several minutes. The patient will be seen weekly by the radiation oncology team to monitor tolerance and address any side effects. In the case of a 3-5 fraction treatment, the patient is seen once unless the need arises for them to be seen additionally.   Potential side effects were reviewed, including cough, shortness of breath, fatigue, transient chest wall discomfort, or late rib fracture depending on tumor location. We also discussed the risk of radiation pneumonitis, which may occur weeks to months after treatment and is usually manageable with corticosteroids. Rare risks include injury to the heart, spinal cord, or major airways, depending on proximity to the target. Overall, SBRT is generally well tolerated with a low risk of significant toxicity.  Post-treatment follow-up will include a clinic visit and chest CT approximately 3 months after SBRT, then every 3-6 months for the first 2 years, and every 6-12 months thereafter to monitor for recurrence and lung function.  The patients case was discussed in a multidisciplinary thoracic oncology conference with SBRT being the agreed upon treatment recommendation.  The patient was  provided with contact information for the care team and encouraged to reach out with any questions or concerns.  -Simulation scheduled for ***  with plan to start radiation the following week of ***   Total time spent today in preparation for this visit was *** minutes. This included patient care, imaging and path review, documentation, multidisciplinary discussion and coordination of care and follow up.    Estefana HERO. Maritza, M.D.   "

## 2024-10-28 NOTE — Progress Notes (Incomplete)
 Location of tumor and Histology per Pathology Report:   Biopsy: ***  Past/Anticipated interventions by surgeon, if any: {t:21944} ***  Past/Anticipated interventions by medical oncology, if any: Chemotherapy ***    Pain issues, if any:  {:18581} {PAIN DESCRIPTION:21022940}  SAFETY ISSUES: Prior radiation? {:18581} Pacemaker/ICD? {:18581} Possible current pregnancy? no Is the patient on methotrexate? {:18581}  Current Complaints / other details:  ***     ***

## 2024-10-29 DIAGNOSIS — C3411 Malignant neoplasm of upper lobe, right bronchus or lung: Secondary | ICD-10-CM | POA: Insufficient documentation

## 2024-10-29 DIAGNOSIS — C3412 Malignant neoplasm of upper lobe, left bronchus or lung: Secondary | ICD-10-CM | POA: Insufficient documentation

## 2024-10-30 ENCOUNTER — Ambulatory Visit
Admission: RE | Admit: 2024-10-30 | Discharge: 2024-10-30 | Disposition: A | Discharge status: Home / Self Care | Source: Ambulatory Visit | Attending: Radiation Oncology | Admitting: Radiation Oncology

## 2024-10-30 VITALS — BP 144/58 | HR 73 | Temp 97.6°F | Resp 17 | Wt 169.4 lb

## 2024-10-30 DIAGNOSIS — Z801 Family history of malignant neoplasm of trachea, bronchus and lung: Secondary | ICD-10-CM | POA: Insufficient documentation

## 2024-10-30 DIAGNOSIS — J449 Chronic obstructive pulmonary disease, unspecified: Secondary | ICD-10-CM | POA: Diagnosis not present

## 2024-10-30 DIAGNOSIS — C3412 Malignant neoplasm of upper lobe, left bronchus or lung: Secondary | ICD-10-CM

## 2024-10-30 DIAGNOSIS — Z800 Family history of malignant neoplasm of digestive organs: Secondary | ICD-10-CM | POA: Diagnosis not present

## 2024-10-30 DIAGNOSIS — J439 Emphysema, unspecified: Secondary | ICD-10-CM | POA: Diagnosis not present

## 2024-10-30 DIAGNOSIS — C3411 Malignant neoplasm of upper lobe, right bronchus or lung: Secondary | ICD-10-CM

## 2024-10-30 DIAGNOSIS — I272 Pulmonary hypertension, unspecified: Secondary | ICD-10-CM | POA: Diagnosis not present

## 2024-10-30 DIAGNOSIS — K219 Gastro-esophageal reflux disease without esophagitis: Secondary | ICD-10-CM | POA: Insufficient documentation

## 2024-10-30 DIAGNOSIS — Z7982 Long term (current) use of aspirin: Secondary | ICD-10-CM | POA: Insufficient documentation

## 2024-10-30 DIAGNOSIS — E041 Nontoxic single thyroid nodule: Secondary | ICD-10-CM | POA: Diagnosis not present

## 2024-10-30 DIAGNOSIS — I350 Nonrheumatic aortic (valve) stenosis: Secondary | ICD-10-CM | POA: Insufficient documentation

## 2024-10-30 DIAGNOSIS — E785 Hyperlipidemia, unspecified: Secondary | ICD-10-CM | POA: Diagnosis not present

## 2024-10-30 DIAGNOSIS — I4891 Unspecified atrial fibrillation: Secondary | ICD-10-CM | POA: Insufficient documentation

## 2024-10-30 DIAGNOSIS — I10 Essential (primary) hypertension: Secondary | ICD-10-CM | POA: Insufficient documentation

## 2024-10-30 DIAGNOSIS — R011 Cardiac murmur, unspecified: Secondary | ICD-10-CM | POA: Diagnosis not present

## 2024-10-30 DIAGNOSIS — K449 Diaphragmatic hernia without obstruction or gangrene: Secondary | ICD-10-CM | POA: Diagnosis not present

## 2024-10-30 DIAGNOSIS — Z87891 Personal history of nicotine dependence: Secondary | ICD-10-CM | POA: Diagnosis not present

## 2024-10-30 DIAGNOSIS — Z79899 Other long term (current) drug therapy: Secondary | ICD-10-CM | POA: Diagnosis not present

## 2024-10-30 DIAGNOSIS — M810 Age-related osteoporosis without current pathological fracture: Secondary | ICD-10-CM | POA: Insufficient documentation

## 2024-10-30 DIAGNOSIS — I2510 Atherosclerotic heart disease of native coronary artery without angina pectoris: Secondary | ICD-10-CM | POA: Insufficient documentation

## 2024-10-30 NOTE — Addendum Note (Signed)
 Encounter addended by: Maritza Stagger, MD on: 10/30/2024 11:54 PM  Actions taken: Clinical Note Signed

## 2024-11-02 ENCOUNTER — Telehealth: Payer: Self-pay | Admitting: Neurology

## 2024-11-02 NOTE — Telephone Encounter (Signed)
 Pt has called to cx, she is sick

## 2024-11-03 ENCOUNTER — Telehealth: Payer: Self-pay

## 2024-11-03 ENCOUNTER — Ambulatory Visit: Admitting: Neurology

## 2024-11-03 NOTE — Telephone Encounter (Signed)
 Prolia  VOB initiated via [email or web address]  Next Prolia  inj DUE: 11/21/24

## 2024-11-04 ENCOUNTER — Other Ambulatory Visit: Payer: Self-pay | Admitting: Internal Medicine

## 2024-11-04 ENCOUNTER — Other Ambulatory Visit (HOSPITAL_COMMUNITY): Payer: Self-pay

## 2024-11-04 NOTE — Telephone Encounter (Signed)
 Dominique [name]

## 2024-11-04 NOTE — Telephone Encounter (Signed)
 Pt ready for scheduling for PROLIA  on or after : 11/21/24  Option# 1: Buy/Bill (Office supplied medication)  Out-of-pocket cost due at time of clinic visit: $0  Number of injection/visits approved: ---  Primary: MEDICARE Prolia  co-insurance: 0% Admin fee co-insurance: 0%  Secondary: [id number] Prolia  co-insurance: covers the Medicare Part B deductible, co-insurance and 100% of the excess charges Admin fee co-insurance:   Medical Benefit Details: Date Benefits were checked: 11/03/24 Deductible: $0 Met of $283 Required/ Coinsurance: 0%/ Admin Fee: 0%  Prior Auth: N/A PA# Expiration Date:   # of doses approved: ----------------------------------------------------------------------- Option# 2- Med Obtained from pharmacy:  Pharmacy benefit: Copay $--- (Paid to pharmacy) Admin Fee: --- (Pay at clinic)  Prior Auth: --- PA# Expiration Date:   # of doses approved:   If patient wants fill through the pharmacy benefit please send prescription to: ---, and include estimated need by date in rx notes. Pharmacy will ship medication directly to the office.  Patient NOT eligible for Prolia  Copay Card. Copay Card can make patient's cost as little as $25. Link to apply: [email or web address]://[email or web address]  ** This summary of benefits is an estimation of the patient's out-of-pocket cost. Exact cost may very based on individual plan coverage.

## 2024-11-07 ENCOUNTER — Other Ambulatory Visit (HOSPITAL_BASED_OUTPATIENT_CLINIC_OR_DEPARTMENT_OTHER): Payer: Self-pay | Admitting: Family

## 2024-11-07 DIAGNOSIS — E785 Hyperlipidemia, unspecified: Secondary | ICD-10-CM

## 2024-11-07 DIAGNOSIS — Z8673 Personal history of transient ischemic attack (TIA), and cerebral infarction without residual deficits: Secondary | ICD-10-CM

## 2024-11-07 DIAGNOSIS — I2510 Atherosclerotic heart disease of native coronary artery without angina pectoris: Secondary | ICD-10-CM

## 2024-11-10 ENCOUNTER — Ambulatory Visit
Admission: RE | Admit: 2024-11-10 | Discharge: 2024-11-10 | Disposition: A | Discharge status: Home / Self Care | Source: Ambulatory Visit | Attending: Radiation Oncology | Admitting: Radiation Oncology

## 2024-11-10 DIAGNOSIS — C3411 Malignant neoplasm of upper lobe, right bronchus or lung: Secondary | ICD-10-CM | POA: Diagnosis present

## 2024-11-10 DIAGNOSIS — C3412 Malignant neoplasm of upper lobe, left bronchus or lung: Secondary | ICD-10-CM | POA: Diagnosis present

## 2024-11-12 ENCOUNTER — Ambulatory Visit (INDEPENDENT_AMBULATORY_CARE_PROVIDER_SITE_OTHER): Admitting: Cardiovascular Disease

## 2024-11-12 ENCOUNTER — Telehealth: Payer: Self-pay

## 2024-11-12 ENCOUNTER — Encounter (HOSPITAL_BASED_OUTPATIENT_CLINIC_OR_DEPARTMENT_OTHER): Payer: Self-pay | Admitting: Cardiovascular Disease

## 2024-11-12 VITALS — BP 142/64 | HR 74 | Ht 66.0 in | Wt 167.8 lb

## 2024-11-12 DIAGNOSIS — I6523 Occlusion and stenosis of bilateral carotid arteries: Secondary | ICD-10-CM

## 2024-11-12 DIAGNOSIS — I7121 Aneurysm of the ascending aorta, without rupture: Secondary | ICD-10-CM

## 2024-11-12 DIAGNOSIS — I10 Essential (primary) hypertension: Secondary | ICD-10-CM | POA: Diagnosis not present

## 2024-11-12 DIAGNOSIS — Z952 Presence of prosthetic heart valve: Secondary | ICD-10-CM | POA: Diagnosis not present

## 2024-11-12 DIAGNOSIS — E7800 Pure hypercholesterolemia, unspecified: Secondary | ICD-10-CM | POA: Diagnosis not present

## 2024-11-12 DIAGNOSIS — I2510 Atherosclerotic heart disease of native coronary artery without angina pectoris: Secondary | ICD-10-CM

## 2024-11-12 NOTE — Patient Instructions (Signed)
 Medication Instructions:  Your physician recommends that you continue on your current medications as directed. Please refer to the Current Medication list given to you today.   *If you need a refill on your cardiac medications before your next appointment, please call your pharmacy*  Lab Work: NONE  Testing/Procedures: NONE  Follow-Up: At [facility], you and your health needs are our priority.  As part of our continuing mission to provide you with exceptional heart care, our providers are all part of one team.  This team includes your primary Cardiologist (physician) and Advanced Practice Providers or APPs (Physician Assistants and Nurse Practitioners) who all work together to provide you with the care you need, when you need it.  Your next appointment:   6 month(s)  Provider:   Reche Finder, NP   DR [facility] IN 1 YEAR   We recommend signing up for the patient portal called MyChart.  Sign up information is provided on this After Visit Summary.  MyChart is used to connect with patients for Virtual Visits (Telemedicine).  Patients are able to view lab/test results, encounter notes, upcoming appointments, etc.  Non-urgent messages can be sent to your provider as well.   To learn more about what you can do with MyChart, go to [email or web address].

## 2024-11-12 NOTE — Telephone Encounter (Signed)
 How long after seeing radiation oncology should the patient wait to come back,patient is seeing them the same day as scheduled appointment with you.

## 2024-11-12 NOTE — Telephone Encounter (Signed)
 Copied from CRM #[id number]. Topic: Clinical - Medical Advice >> Nov 09, 2024  2:30 PM Devaughn RAMAN wrote: Reason for CRM: Pt stated she has radiation on 1.30.26. Pt would like to inquire if she should keep her appt for 1.30.26 with NP Groce. Please f/u with pt regarding this. Pt stated NP Groce wanted to inquire about this prior to but she wanted to f/u regarding this.      I called and spoke to pt. Pt states that she needs to reschedule her appt with Lauraine as her radiation appt is on the same day and she needs to do that before she see's Lauraine. I informed her that I would route a message to [facility] nurse for her to advise what day would be best for her to be rescheduled as Lauraine has slots booked for lung nodules. Pt verbalized understanding. Routing to [place].

## 2024-11-12 NOTE — Progress Notes (Signed)
 " Cardiology Office Note:  .   Date:  11/13/2024  ID:  Dominique [name], DOB 1941/03/27, MRN [id number] PCP: Dominique Aloysius BRAVO, MD  [facility] Providers Cardiologist:  Dominique Scarce, MD    History of Present Illness: .    Dominique [name] is a 84 y.o. female  with severe aortic stenosis s/p TAVR, non-obstructive CAD, mild carotid stenosis, hyperlipidemia, SVT, moderate TR, mild pulmonary hypertension, hypertension, prior tobacco abuse, and severe COPD here for follow-up. Dominique [name] saw Dominique [name] on 05/2017.  She was noted to have moderate aortic stenosis (peak velocity 3.1 m/s, mean gradient 21 mmHg).  She had a chest CT 11/2016 that showed coronary calcifications.  She subsequently had a coronary CT-A 05/2017 that revealed less than 50% plaque in the proximal LAD and a mild ascending aortic aneurysm (3.9 cm).  She was noted to have calcification of the aorta and aortic valve.  Echo 05/31/17 that revealed LVEF 60-65% with normal diastolic function.  She had a repeat echo 05/2018 that showed her mean aortic valve gradient increased to 36 mmHg.  She continued to report chest discomfort so she had a repeat coronary CT-A 08/2018 that revealed 25-49% stenosis of the proximal LAD unchanged from prior.    She reported increasing exertional dyspnea and chest discomfort.  She was referred to Dominique [name] and underwent TAVR 26 mm Medtronic CoreValve Evolut Pro on 12/2018.  She had a cardiac catheterization prior to TAVR that showed normal coronary arteries.    Postoperative echo revealed a mean gradient of 6 mmHg.  However this procedure was complicated by an ischemic left leg.  She went to the OR emergently with Dominique [name] for repair of the left femoral artery with endarterectomy and dacryon patch angioplasty.   Dominique [name] required a laparoscopic cholecystectomy 03/2019.  Her hospitalization was complicated by atrial fibrillation.  This was thought to be due to her acute illness and she was not started on long-term  anticoagulation.  She saw Dr. Amon 05/2019 and reported symptoms concerning for TIA.  She had bilateral carotid Dopplers that showed 60 to 79% right ICA stenosis and 1 to 39% stenosis on the left.  She was referred to neurology and had an MRI of the brain 06/30/2019 that showed chronic small vessel changes but no acute findings.  Repeat carotid Dopplers 02/2020 revealed mild stenosis bilaterally.  Dominique [name] had heart racing after her third COVID-19 vaccination 06/2020.  She wore a heart monitor that showed several runs of SVT.  The longest episode was 7 beats.     She saw Aline Door, [place] 02/2021 and was taking lasix  intermittently for edema. Her blood pressure was elevated so amlodipine  was reduced and she was started on HCTZ. She followed up with Josefa Beauvais, NP and her symptoms had improved.  At her appointment 10/2022 blood pressure was elevated in the office but she reported it was well-controlled at home.  She had recently been started on supplemental oxygen .  She saw Reche Finder, NP 11/2022 and was excited that she was no longer on oxygen .  They noted that her home blood pressure cuff reads 10 points higher than the office cuff.  Her home blood pressures have been running in the 100s to 130s over 50s to 60s.  She was seen 04/2023 with new onset fatigue and dyspnea.  Her oxygen  levels dropped to 87% with ambulation.  Recommended that she start back using supplemental oxygen  and follow-up with pulmonology.  Check carotid Dopplers 05/2023 that  revealed mild stenosis bilaterally.  She was also found to have multiple thyroid  nodules.  She also has multiple lung nodules.  She follows with pulmonology for this.  Given the location she elected for serial surveillance rather than biopsy.   Ms. Puccini notes that since starting nighttime oxygen  therapy, breathing has improved, and nasal congestion has resolved with nasal drops. Oxygen  saturation is 95% during the day.   She experiences occasional palpitations  that occur infrequently and do not last long. Previously, there were frequent palpitations over a two to three week period, but this has resolved. She is currently on amlodipine , hydrochlorothiazide , and metoprolol , with instructions to take an extra dose of metoprolol  if palpitations occur frequently. No dizziness, lightheadedness, chest pain, or pressure is reported.   She describes her feet and hands as 'always ice cold' and has a history of needing urgent surgery following a heart procedure, with circulation not returning to normal since then. No pain in the legs when walking is noted, and there is no swelling in the legs or feet.   Blood pressure at home is typically around 132/72 mmHg, and she has not experienced any swelling in the legs or feet.   She has not been engaging in regular exercise since starting oxygen  therapy, despite having equipment at home, including a resistance machine that remains unpacked. She lacks interest in exercise but acknowledges its importance.   She saw Reche Finder, NP 04/2024 and disussed a transient vision loss 03/2024 concerning for TIA.    Discussed the use of AI scribe software for clinical note transcription with the patient, who gave verbal consent to proceed.  History of Present Illness Dominique [name] is scheduled to begin radiation therapy for her lungs on January 26th due to a long-standing issue that has been monitored for years and is now growing slightly. She has multiple pulmonary nodules that have been under surveillance.  PET/CT 09/2024 demonstrated low-level FDG uptake in all nodules.  She uses oxygen  at night but not during the day and feels fine, though she is a bit slower than before. She remains active and continues her usual activities.  She experienced a brief episode of chest discomfort the previous day, described as a 'little pain' lasting about five minutes while driving, with no associated physical exertion. She has not experienced any pain  during physical activities.  She continues to be physically active and denies exertional symptoms.  No swelling in her legs or feet.  She also takes aspirin  and has been on it for years. She mentions a concern about bruising.  She checks her blood pressure at home and reports it is usually normal, although she forgot to take her medication this morning before the visit.  ROS:  As per HPI  Studies Reviewed: .      Echo 03/2024: 1. Left ventricular ejection fraction, by estimation, is 55 to 60%. The  left ventricle has normal function. The left ventricle has no regional  wall motion abnormalities. Left ventricular diastolic parameters were  normal.   2. Right ventricular systolic function is normal. The right ventricular  size is normal.   3. The mitral valve is normal in structure. Mild mitral valve  regurgitation.   4. S/p TAVR(Medtronic Core Valve Evolue Pro, 26 mm), procedure date  12/30/18)     Peak and mean gradeitns through the valve are 17 and 9 mm Hg  respectively AVA (VTI) is 1.2 cm2. DVI is 0.47 Very mild perivalvular  regurgitation.  Compared to echo from 2023, mean gradient is mildly increased (4 to 9  mm Hg) . The aortic valve has been repaired/replaced. Aortic valve  regurgitation is mild.   5. The inferior vena cava is normal in size with greater than 50%  respiratory variability, suggesting right atrial pressure of 3 mmHg.     Risk Assessment/Calculations:     HYPERTENSION CONTROL Vitals:   11/12/24 1536 11/12/24 1610  BP: (!) 152/70 (!) 142/64    The patient's blood pressure is elevated above target today.  In order to address the patient's elevated BP: Blood pressure will be monitored at home to determine if medication changes need to be made.          Physical Exam:   VS:  BP (!) 142/64   Pulse 74   Ht 5' 6 (1.676 m)   Wt 167 lb 12.8 oz (76.1 kg)   SpO2 91%   BMI 27.08 kg/m  , BMI Body mass index is 27.08 kg/m. GENERAL:  Well appearing HEENT:  Pupils equal round and reactive, fundi not visualized, oral mucosa unremarkable NECK:  No jugular venous distention, waveform within normal limits, carotid upstroke brisk and symmetric, no bruits, no thyromegaly LUNGS:  Clear to auscultation bilaterally HEART:  RRR.  PMI not displaced or sustained,S1 and S2 within normal limits, no S3, no S4, no clicks, no rubs, II/VI systolic murmur at the LUSB ABD:  Flat, positive bowel sounds normal in frequency in pitch, no bruits, no rebound, no guarding, no midline pulsatile mass, no hepatomegaly, no splenomegaly EXT:  2 plus pulses throughout, no edema, no cyanosis no clubbing SKIN:  No rashes no nodules NEURO:  Cranial nerves II through XII grossly intact, motor grossly intact throughout PSYCH:  Cognitively intact, oriented to person place and time   ASSESSMENT AND PLAN: .    Assessment & Plan # S/p TAVR:  No new symptoms or complications. Last echocardiogram 03/2024 revealed normal gradient and mild AR.   - Ensure prophylactic amoxicillin  before dental procedures.  # Hypertension Blood pressure well-controlled with current regimen at home.  She forgot to take her medication this am.  - Continue amlodipine , hydrochlorothiazide , and metoprolol . - Monitor blood pressure regularly at home.  BP goal <130/80.  # Hyperlipidemia Cholesterol levels controlled with rosuvastatin . - Continue rosuvastatin  and Repatha .  # Carotid stenosis: Repeat Dopplers pending.          Dispo: f/u 6 months  Signed, Dominique Scarce, MD   "

## 2024-11-13 ENCOUNTER — Encounter (HOSPITAL_BASED_OUTPATIENT_CLINIC_OR_DEPARTMENT_OTHER): Payer: Self-pay | Admitting: Cardiovascular Disease

## 2024-11-13 NOTE — Telephone Encounter (Signed)
 Called and got patient scheduled.NFN

## 2024-11-20 ENCOUNTER — Ambulatory Visit: Admitting: Internal Medicine

## 2024-11-20 ENCOUNTER — Encounter: Payer: Self-pay | Admitting: Internal Medicine

## 2024-11-20 VITALS — BP 126/86 | HR 63 | Temp 98.0°F | Resp 16 | Ht 66.0 in | Wt 171.0 lb

## 2024-11-20 DIAGNOSIS — J9611 Chronic respiratory failure with hypoxia: Secondary | ICD-10-CM

## 2024-11-20 DIAGNOSIS — J449 Chronic obstructive pulmonary disease, unspecified: Secondary | ICD-10-CM

## 2024-11-20 DIAGNOSIS — M810 Age-related osteoporosis without current pathological fracture: Secondary | ICD-10-CM

## 2024-11-20 DIAGNOSIS — Z952 Presence of prosthetic heart valve: Secondary | ICD-10-CM

## 2024-11-20 DIAGNOSIS — C3411 Malignant neoplasm of upper lobe, right bronchus or lung: Secondary | ICD-10-CM | POA: Diagnosis not present

## 2024-11-20 DIAGNOSIS — I10 Essential (primary) hypertension: Secondary | ICD-10-CM

## 2024-11-20 DIAGNOSIS — E7800 Pure hypercholesterolemia, unspecified: Secondary | ICD-10-CM | POA: Diagnosis not present

## 2024-11-20 LAB — CBC WITH DIFFERENTIAL/PLATELET
Basophils Absolute: 0.1 K/uL (ref 0.0–0.1)
Basophils Relative: 0.7 % (ref 0.0–3.0)
Eosinophils Absolute: 0.3 K/uL (ref 0.0–0.7)
Eosinophils Relative: 2.9 % (ref 0.0–5.0)
HCT: 41.4 % (ref 36.0–46.0)
Hemoglobin: 14.1 g/dL (ref 12.0–15.0)
Lymphocytes Relative: 14.5 % (ref 12.0–46.0)
Lymphs Abs: 1.4 K/uL (ref 0.7–4.0)
MCHC: 34.1 g/dL (ref 30.0–36.0)
MCV: 93.1 fl (ref 78.0–100.0)
Monocytes Absolute: 0.8 K/uL (ref 0.1–1.0)
Monocytes Relative: 8.2 % (ref 3.0–12.0)
Neutro Abs: 6.9 K/uL (ref 1.4–7.7)
Neutrophils Relative %: 73.7 % (ref 43.0–77.0)
Platelets: 243 K/uL (ref 150.0–400.0)
RBC: 4.45 Mil/uL (ref 3.87–5.11)
RDW: 13.8 % (ref 11.5–15.5)
WBC: 9.4 K/uL (ref 4.0–10.5)

## 2024-11-20 LAB — LIPID PANEL
Cholesterol: 97 mg/dL (ref 28–200)
HDL: 75 mg/dL
LDL Cholesterol: 11 mg/dL (ref 10–99)
NonHDL: 21.59
Total CHOL/HDL Ratio: 1
Triglycerides: 54 mg/dL (ref 10.0–149.0)
VLDL: 10.8 mg/dL (ref 0.0–40.0)

## 2024-11-20 LAB — BASIC METABOLIC PANEL WITH GFR
BUN: 16 mg/dL (ref 6–23)
CO2: 34 meq/L — ABNORMAL HIGH (ref 19–32)
Calcium: 9.6 mg/dL (ref 8.4–10.5)
Chloride: 101 meq/L (ref 96–112)
Creatinine, Ser: 0.71 mg/dL (ref 0.40–1.20)
GFR: 78 mL/min
Glucose, Bld: 91 mg/dL (ref 70–99)
Potassium: 4 meq/L (ref 3.5–5.1)
Sodium: 141 meq/L (ref 135–145)

## 2024-11-20 NOTE — Patient Instructions (Signed)
 Please read your instructions carefully.   GO TO THE LAB :  Get the blood work    Go to the front desk for the checkout Please make an appointment for a physical exam in 4 months       Continue checking your blood pressure regularly Blood pressure goal:  between 110/65 and  130/80. If it is consistently higher or lower, let me know

## 2024-11-20 NOTE — Assessment & Plan Note (Signed)
 HTN: At the last visit, started HCTZ.  Follow-up BMP okay.  Current on HCTZ, amlodipine , metoprolol  and Lasix  as needed.  Ambulatory BPs in the 130s.  No change, check a BMP and CBC High cholesterol: LDL 23 on Repatha  and crestor  per cardiology.  Patient reports needs a FLP in order to continue getting Repatha .  Labs. Status post TVAR, paroxysmal A-fib, nonobstructive CAD. LOV cardiology 11/12/2024.  No changes made.  Next visit 6 months COPD with chronic respiratory failure, chronic nocturnal hypoxia, pulmonary nodule: LOV pulmonary 10/14/2024, referred to radiation therapy, seen for presumed lung cancer (biopsy too risky to pursue).  Will start XRT next week.  She is understandably anxious, reassured. Osteoporosis: Had a normal bone density test before however started Fosamax, on osteoporosis treatment d/t h/o a sacral fracture back in 2016 subsequently switched to Prolia  in 2017. Plan: Proceed with the next Prolia , check a DEXA Vaccine advised: States she had flu and a COVID booster. RTC 4 months CPX

## 2024-11-20 NOTE — Progress Notes (Signed)
 "  Subjective:    Patient ID: Dominique [name], female    DOB: 1941/05/23, 84 y.o.   MRN: [id number]  DOS:  11/20/2024 Follow-up  Routine follow-up.  Feeling well. Denies chest pain or difficulty breathing.  No cough. Good compliance with nocturnal oxygen .   Review of Systems See above   Past Medical History:  Diagnosis Date   Allergy     Atrial fibrillation (HCC)    a. dx 03/2019 while in hospital with cholecystitis   COPD (chronic obstructive pulmonary disease) (HCC)    Critical lower limb ischemia (HCC)    a. after TAVR developed ischemic L leg likely due to flap/closure, s/p emergent repair of left femoral artery with endarterectomy and Dacron patch angioplasty.   Emphysema lung (HCC)    Emphysema of lung (HCC)    GERD (gastroesophageal reflux disease)    Heart murmur    Hyperlipidemia    LDL goal < 70   Hypertension    Hypomagnesemia    Normal coronary arteries 10/2018   NSVT (nonsustained ventricular tachycardia) (HCC) 03/02/2019   Osteoporosis    Oxygen  deficiency    Pulmonary HTN (HCC) 06/05/2016   Moderate with PASP by echo 04/2016 likely Group 3 from COPD and possibly Group 2 from pulmonary venous HTN associated with moderate AS - f/u echo 12/2018 post TAVR showed normal RVSP   Pulmonary nodules    seen on pre TAVR CT, needs 12 month CT follow up   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 s/p TAVR 12/2018.   Thoracic ascending aortic aneurysm    needs yearly follow up   Thyroid  nodule 11/08/2022    Past Surgical History:  Procedure Laterality Date   APPENDECTOMY  1951   CARDIAC VALVE REPLACEMENT  01/26/19   CATARACT EXTRACTION     CESAREAN SECTION     1971   CHOLECYSTECTOMY N/A 04/28/2019   Procedure: LAPAROSCOPIC CHOLECYSTECTOMY;  Surgeon: Aron Shoulders, MD;  Location: [facility];  Service: General;   Laterality: N/A;   COLONOSCOPY  2018   EYE SURGERY     FEMORAL-POPLITEAL BYPASS GRAFT Left 12/31/2018   Procedure: PATCH ANGIOPLASTY REPAIR LEFT FEMORAL ARTERY USING HEMASHIELD PLATINUM FINESSE PATCH;  Surgeon: Oris Krystal FALCON, MD;  Location: [facility] OR;  Service: Vascular;  Laterality: Left;   IR RADIOLOGY PERIPHERAL GUIDED IV START  05/30/2017   IR RADIOLOGY PERIPHERAL GUIDED IV START  09/02/2018   IR RADIOLOGY PERIPHERAL GUIDED IV START  09/02/2018   IR US  GUIDE VASC ACCESS LEFT  09/02/2018   IR US  GUIDE VASC ACCESS RIGHT  05/30/2017   IR US  GUIDE VASC ACCESS RIGHT  09/02/2018   JOINT REPLACEMENT  2016   sacroplasty   LAMINECTOMY     RIGHT/LEFT HEART CATH AND CORONARY ANGIOGRAPHY N/A 11/27/2018   Procedure: RIGHT/LEFT HEART CATH AND CORONARY ANGIOGRAPHY;  Surgeon: Wonda Sharper, MD;  Location: [facility] INVASIVE CV LAB;  Service: Cardiovascular;  Laterality: N/A;   TEE WITHOUT CARDIOVERSION N/A 12/30/2018   Procedure: TRANSESOPHAGEAL ECHOCARDIOGRAM (TEE);  Surgeon: Wonda Sharper, MD;  Location: [facility] INVASIVE CV LAB;  Service: Open Heart Surgery;  Laterality: N/A;   TOTAL HIP ARTHROPLASTY Bilateral    TRANSCATHETER AORTIC VALVE REPLACEMENT, TRANSFEMORAL N/A 12/30/2018   Procedure: TRANSCATHETER AORTIC VALVE REPLACEMENT, TRANSFEMORAL;  Surgeon: Wonda Sharper, MD;  Location: [facility] INVASIVE CV LAB;  Service: Open Heart Surgery;  Laterality: N/A;   TUBAL LIGATION  1975    Current Outpatient Medications  Medication Instructions   albuterol  (VENTOLIN  HFA) 108 (90 Base) MCG/ACT inhaler 2 puffs, Inhalation, Every 6 hours PRN   amLODipine  (NORVASC ) 5 mg, Oral, Daily   amoxicillin  (AMOXIL ) 500 MG tablet TAKE 4 TABLETS (2,000MG ) ONE HOUR PRIOR TO ALL DENTAL VISITS.   aspirin  EC 81 mg, Daily   budesonide -glycopyrrolate-formoterol  (BREZTRI  AEROSPHERE) 160-9-4.8 MCG/ACT AERO inhaler 2 puffs, Inhalation, 2 times daily   denosumab  (PROLIA ) 60 mg, Every 6 months   EPINEPHrine  (EPI-PEN) 0.3 mg, As needed    fexofenadine (ALLEGRA) 180 mg, Daily PRN   fluticasone  (FLONASE ) 50 MCG/ACT nasal spray 1 spray, Each Nare, Daily at bedtime   furosemide  (LASIX ) 20 MG tablet DAILY AS NEEDED FOR SWELLING   hydrochlorothiazide  (HYDRODIURIL ) 12.5 mg, Oral, Daily   magnesium  oxide (MAG-OX) 400 mg, Oral, Daily   metoprolol  succinate (TOPROL -XL) 25 mg, Oral, Daily   Multiple Vitamin (MULTIVITAMIN WITH MINERALS) TABS tablet 1 tablet, Daily with breakfast   OXYGEN  2 L/min, Daily at bedtime   Repatha  SureClick 140 mg, Subcutaneous, Every 14 days   rosuvastatin  (CRESTOR ) 40 mg, Oral, Daily   Vitamin D3 5,000 Units, Daily       Objective:   Physical Exam BP 126/86   Pulse 63   Temp 98 F (36.7 C) (Oral)   Resp 16   Ht 5' 6 (1.676 m)   Wt 171 lb (77.6 kg)   SpO2 96%   BMI 27.60 kg/m  General:   Well developed, NAD, BMI noted. HEENT:  Normocephalic . Face symmetric, atraumatic Lungs:  CTA B Normal respiratory effort, no intercostal retractions, no accessory muscle use. Heart: RRR,  no murmur.  Lower extremities: no pretibial edema bilaterally  Skin: Not pale. Not jaundice Neurologic:  alert & oriented X3.  Speech normal, gait appropriate for age and unassisted Psych--  Cognition and judgment appear intact.  Cooperative with normal attention span and concentration.  Behavior appropriate. No anxious or depressed appearing.      Assessment    Assessment Hyperglycemia HTN Hyperlipidemia PULM: -Emphysema, quit tobacco 2006 --Nocturnal O2 as needed ----Pulmonary nodules- possibly cancer.  Declined bx, prefers surveillance ----enlarged R P.A. and Ao CV: -S/p TVAR 12/2018 -Paroxysmal A. fib DX 6/28/ 2020 in the context of acute cholecystitis, converted to NSR, not anticoagulated   -12/31/2018 ischemic left leg after TEVAR, endarterectomy performed --Nonobstructive CAD (per coronary CTA) -Aspirin  indefinitely, SBE prophylaxis with amoxicillin  -MSK --DJD --Sacral Fx, sacral insufficiency,  s/p sacroplasty (radiology) 07-2015 --H/o osteoporosis :  took fosamax while in [place] , Fosamax rx again by ortho after sacral Fx 07-2015, never took it, first Prolia  11-14-2015 --T score 08-2015 >> normal (likely normal d/t DJD?) H/o vit D def: Normal level 09-2015  Thyroid  nodules- per Dr Tommas , s/p bx 01/2023     PLAN   HTN: At the last visit, started HCTZ.  Follow-up BMP okay.  Current on HCTZ, amlodipine , metoprolol  and Lasix  as needed.  Ambulatory BPs in the 130s.  No change, check a BMP and CBC High cholesterol: LDL 23 on Repatha  and crestor  per cardiology.  Patient reports needs a FLP in order to continue getting Repatha .  Labs. Status post TVAR, paroxysmal A-fib, nonobstructive CAD. LOV cardiology 11/12/2024.  No changes made.  Next visit 6 months COPD with chronic respiratory failure, chronic nocturnal hypoxia, pulmonary nodule: LOV pulmonary 10/14/2024, referred to radiation therapy, seen for presumed lung cancer (biopsy too  risky to pursue).  Will start XRT next week.  She is understandably anxious, reassured. Osteoporosis: Had a normal bone density test before however started Fosamax, on osteoporosis treatment d/t h/o a sacral fracture back in 2016 subsequently switched to Prolia  in 2017. Plan: Proceed with the next Prolia , check a DEXA Vaccine advised: States she had flu and a COVID booster. RTC 4 months CPX     "

## 2024-11-23 ENCOUNTER — Ambulatory Visit: Admitting: Radiation Oncology

## 2024-11-23 ENCOUNTER — Ambulatory Visit: Payer: Self-pay | Admitting: Internal Medicine

## 2024-11-24 ENCOUNTER — Ambulatory Visit: Admitting: Radiation Oncology

## 2024-11-25 ENCOUNTER — Ambulatory Visit: Admitting: Radiation Oncology

## 2024-11-25 ENCOUNTER — Other Ambulatory Visit: Payer: Self-pay

## 2024-11-25 LAB — RAD ONC ARIA SESSION SUMMARY
Course Elapsed Days: 0
Plan Fractions Treated to Date: 1
Plan Fractions Treated to Date: 1
Plan Prescribed Dose Per Fraction: 10 Gy
Plan Prescribed Dose Per Fraction: 10 Gy
Plan Total Fractions Prescribed: 5
Plan Total Fractions Prescribed: 5
Plan Total Prescribed Dose: 50 Gy
Plan Total Prescribed Dose: 50 Gy
Reference Point Dosage Given to Date: 10 Gy
Reference Point Dosage Given to Date: 10 Gy
Reference Point Session Dosage Given: 10 Gy
Reference Point Session Dosage Given: 10 Gy
Session Number: 1

## 2024-11-26 ENCOUNTER — Ambulatory Visit: Admission: RE | Admit: 2024-11-26 | Source: Ambulatory Visit

## 2024-11-26 ENCOUNTER — Other Ambulatory Visit: Payer: Self-pay

## 2024-11-26 ENCOUNTER — Ambulatory Visit
Admission: RE | Admit: 2024-11-26 | Discharge: 2024-11-26 | Disposition: A | Discharge status: Home / Self Care | Source: Ambulatory Visit | Attending: Radiation Oncology | Admitting: Radiation Oncology

## 2024-11-26 LAB — RAD ONC ARIA SESSION SUMMARY
Course Elapsed Days: 1
Plan Fractions Treated to Date: 2
Plan Fractions Treated to Date: 2
Plan Prescribed Dose Per Fraction: 10 Gy
Plan Prescribed Dose Per Fraction: 10 Gy
Plan Total Fractions Prescribed: 5
Plan Total Fractions Prescribed: 5
Plan Total Prescribed Dose: 50 Gy
Plan Total Prescribed Dose: 50 Gy
Reference Point Dosage Given to Date: 20 Gy
Reference Point Dosage Given to Date: 20 Gy
Reference Point Session Dosage Given: 10 Gy
Reference Point Session Dosage Given: 10 Gy
Session Number: 2

## 2024-11-26 MED ORDER — METOPROLOL SUCCINATE ER 25 MG PO TB24: 25.0000 mg | ORAL_TABLET | Freq: Every day | ORAL | 3 refills | Status: AC

## 2024-11-27 ENCOUNTER — Other Ambulatory Visit: Payer: Self-pay

## 2024-11-27 ENCOUNTER — Ambulatory Visit
Admission: RE | Admit: 2024-11-27 | Discharge: 2024-11-27 | Disposition: A | Source: Ambulatory Visit | Attending: Radiation Oncology | Admitting: Radiation Oncology

## 2024-11-27 ENCOUNTER — Ambulatory Visit: Admitting: Acute Care

## 2024-11-27 LAB — RAD ONC ARIA SESSION SUMMARY
Course Elapsed Days: 2
Plan Fractions Treated to Date: 3
Plan Fractions Treated to Date: 3
Plan Prescribed Dose Per Fraction: 10 Gy
Plan Prescribed Dose Per Fraction: 10 Gy
Plan Total Fractions Prescribed: 5
Plan Total Fractions Prescribed: 5
Plan Total Prescribed Dose: 50 Gy
Plan Total Prescribed Dose: 50 Gy
Reference Point Dosage Given to Date: 30 Gy
Reference Point Dosage Given to Date: 30 Gy
Reference Point Session Dosage Given: 10 Gy
Reference Point Session Dosage Given: 10 Gy
Session Number: 3

## 2024-11-30 ENCOUNTER — Ambulatory Visit: Admitting: Radiation Oncology

## 2024-12-01 ENCOUNTER — Telehealth: Payer: Self-pay | Admitting: *Deleted

## 2024-12-01 ENCOUNTER — Ambulatory Visit
Admission: RE | Admit: 2024-12-01 | Discharge: 2024-12-01 | Disposition: A | Source: Ambulatory Visit | Attending: Radiation Oncology | Admitting: Radiation Oncology

## 2024-12-01 ENCOUNTER — Other Ambulatory Visit: Payer: Self-pay

## 2024-12-01 LAB — RAD ONC ARIA SESSION SUMMARY
Course Elapsed Days: 6
Plan Fractions Treated to Date: 4
Plan Fractions Treated to Date: 4
Plan Prescribed Dose Per Fraction: 10 Gy
Plan Prescribed Dose Per Fraction: 10 Gy
Plan Total Fractions Prescribed: 5
Plan Total Fractions Prescribed: 5
Plan Total Prescribed Dose: 50 Gy
Plan Total Prescribed Dose: 50 Gy
Reference Point Dosage Given to Date: 40 Gy
Reference Point Dosage Given to Date: 40 Gy
Reference Point Session Dosage Given: 10 Gy
Reference Point Session Dosage Given: 10 Gy
Session Number: 4

## 2024-12-02 ENCOUNTER — Other Ambulatory Visit: Payer: Self-pay

## 2024-12-02 ENCOUNTER — Ambulatory Visit
Admission: RE | Admit: 2024-12-02 | Discharge: 2024-12-02 | Attending: Radiation Oncology | Admitting: Radiation Oncology

## 2024-12-02 LAB — RAD ONC ARIA SESSION SUMMARY
Course Elapsed Days: 7
Plan Fractions Treated to Date: 5
Plan Fractions Treated to Date: 5
Plan Prescribed Dose Per Fraction: 10 Gy
Plan Prescribed Dose Per Fraction: 10 Gy
Plan Total Fractions Prescribed: 5
Plan Total Fractions Prescribed: 5
Plan Total Prescribed Dose: 50 Gy
Plan Total Prescribed Dose: 50 Gy
Reference Point Dosage Given to Date: 50 Gy
Reference Point Dosage Given to Date: 50 Gy
Reference Point Session Dosage Given: 10 Gy
Reference Point Session Dosage Given: 10 Gy
Session Number: 5

## 2024-12-02 NOTE — Telephone Encounter (Signed)
 Copied from CRM #[id number]. Topic: General - Call Back - No Documentation >> Dec 02, 2024 12:56 PM Dominique [name] wrote: Reason for CRM: Patient is requesting Dominique [name] to call her back when possible

## 2024-12-03 NOTE — Radiation Completion Notes (Signed)
 Patient Name: Dominique [name], Dominique [name] MRN: [id number] Date of Birth: [birth year] Referring Physician: LAMAR CHRIS, M.D. Date of Service: 2024-12-03 Radiation Oncologist: Estefana Cha, M.D. [facility]                             RADIATION ONCOLOGY END OF TREATMENT NOTE     Diagnosis: C34.11 Malignant neoplasm of upper lobe, right bronchus or lung; C34.12 Malignant neoplasm of upper lobe, left bronchus or lung Intent: Curative     ==========DELIVERED PLANS==========  First Treatment Date: 2024-11-25 Last Treatment Date: 2024-12-02   Plan Name: Lung_R_SBRT Site: Lung, Right Technique: SBRT/SRT-IMRT Mode: Photon Dose Per Fraction: 10 Gy Prescribed Dose (Delivered / Prescribed): 50 Gy / 50 Gy Prescribed Fxs (Delivered / Prescribed): 5 / 5   Plan Name: Lung_L_SBRT Site: Lung, Left Technique: SBRT/SRT-IMRT Mode: Photon Dose Per Fraction: 10 Gy Prescribed Dose (Delivered / Prescribed): 50 Gy / 50 Gy Prescribed Fxs (Delivered / Prescribed): 5 / 5     ==========ON TREATMENT VISIT DATES========== 2024-11-25, 2024-11-25, 2024-11-26, 2024-11-26, 2024-11-26, 2024-11-27, 2024-11-27, 2024-12-01, 2024-12-01, 2024-12-02, 2024-12-02     ==========UPCOMING VISITS========== 01/05/2025 [facility]-PULMONARY CARE OFFICE VISIT - LINZIE Ruthell Lauraine JULIANNA, NP  12/31/2024 [facility]-RADIATION ONC FOLLOW UP 30 Cha Estefana, MD  12/16/2024 [facility]-DIAGNOSTIC RAD DG DEXA [facility]-DG DEXA 1        ==========APPENDIX - ON TREATMENT VISIT NOTES==========   See weekly On Treatment Notes in Epic for details in the Media tab (listed as Progress notes on the On Treatment Visit Dates listed above).

## 2024-12-16 ENCOUNTER — Other Ambulatory Visit (HOSPITAL_BASED_OUTPATIENT_CLINIC_OR_DEPARTMENT_OTHER)

## 2024-12-31 ENCOUNTER — Ambulatory Visit: Admitting: Radiation Oncology

## 2025-01-05 ENCOUNTER — Ambulatory Visit: Admitting: Acute Care

## 2025-03-23 ENCOUNTER — Ambulatory Visit: Admitting: Internal Medicine
